# Patient Record
Sex: Male | Born: 1940 | Race: White | Hispanic: No | Marital: Married | Smoking: Former smoker
Health system: Southern US, Community
[De-identification: ages and names within clinical notes are randomized; demographics above are authoritative.]

## PROBLEM LIST (undated history)

## (undated) DIAGNOSIS — C4490 Unspecified malignant neoplasm of skin, unspecified: Secondary | ICD-10-CM

## (undated) DIAGNOSIS — H269 Unspecified cataract: Secondary | ICD-10-CM

## (undated) DIAGNOSIS — M159 Polyosteoarthritis, unspecified: Secondary | ICD-10-CM

## (undated) DIAGNOSIS — I2510 Atherosclerotic heart disease of native coronary artery without angina pectoris: Secondary | ICD-10-CM

## (undated) DIAGNOSIS — G4730 Sleep apnea, unspecified: Secondary | ICD-10-CM

## (undated) DIAGNOSIS — F0390 Unspecified dementia without behavioral disturbance: Secondary | ICD-10-CM

## (undated) DIAGNOSIS — I639 Cerebral infarction, unspecified: Secondary | ICD-10-CM

## (undated) DIAGNOSIS — K219 Gastro-esophageal reflux disease without esophagitis: Secondary | ICD-10-CM

## (undated) DIAGNOSIS — I779 Disorder of arteries and arterioles, unspecified: Secondary | ICD-10-CM

## (undated) DIAGNOSIS — E785 Hyperlipidemia, unspecified: Secondary | ICD-10-CM

## (undated) DIAGNOSIS — L570 Actinic keratosis: Secondary | ICD-10-CM

## (undated) DIAGNOSIS — I350 Nonrheumatic aortic (valve) stenosis: Secondary | ICD-10-CM

## (undated) DIAGNOSIS — C801 Malignant (primary) neoplasm, unspecified: Secondary | ICD-10-CM

## (undated) DIAGNOSIS — R410 Disorientation, unspecified: Secondary | ICD-10-CM

## (undated) DIAGNOSIS — N200 Calculus of kidney: Secondary | ICD-10-CM

## (undated) DIAGNOSIS — I82409 Acute embolism and thrombosis of unspecified deep veins of unspecified lower extremity: Secondary | ICD-10-CM

## (undated) DIAGNOSIS — I10 Essential (primary) hypertension: Secondary | ICD-10-CM

## (undated) DIAGNOSIS — I5032 Chronic diastolic (congestive) heart failure: Secondary | ICD-10-CM

## (undated) DIAGNOSIS — I2699 Other pulmonary embolism without acute cor pulmonale: Secondary | ICD-10-CM

## (undated) HISTORY — DX: Cerebral infarction, unspecified: I63.9

## (undated) HISTORY — DX: Essential (primary) hypertension: I10

## (undated) HISTORY — DX: Malignant (primary) neoplasm, unspecified: C80.1

## (undated) HISTORY — DX: Sleep apnea, unspecified: G47.30

## (undated) HISTORY — DX: Actinic keratosis: L57.0

## (undated) HISTORY — DX: Unspecified cataract: H26.9

## (undated) HISTORY — PX: OTHER SURGICAL HISTORY: SHX169

## (undated) HISTORY — PX: CARDIAC CATHETERIZATION: SHX172

## (undated) HISTORY — DX: Gastro-esophageal reflux disease without esophagitis: K21.9

---
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
## 2001-02-09 HISTORY — PX: OTHER SURGICAL HISTORY: SHX169

## 2003-09-11 DIAGNOSIS — E8881 Metabolic syndrome: Secondary | ICD-10-CM | POA: Insufficient documentation

## 2003-09-11 DIAGNOSIS — E785 Hyperlipidemia, unspecified: Secondary | ICD-10-CM | POA: Insufficient documentation

## 2004-01-16 DIAGNOSIS — J3800 Paralysis of vocal cords and larynx, unspecified: Secondary | ICD-10-CM | POA: Insufficient documentation

## 2007-03-08 HISTORY — PX: OTHER SURGICAL HISTORY: SHX169

## 2007-07-11 DIAGNOSIS — I2510 Atherosclerotic heart disease of native coronary artery without angina pectoris: Secondary | ICD-10-CM | POA: Insufficient documentation

## 2007-07-11 HISTORY — PX: OTHER SURGICAL HISTORY: SHX169

## 2007-07-18 HISTORY — PX: RIGHT AND LEFT HEART CATH: CATH118262

## 2009-06-03 DIAGNOSIS — M75100 Unspecified rotator cuff tear or rupture of unspecified shoulder, not specified as traumatic: Secondary | ICD-10-CM | POA: Insufficient documentation

## 2009-06-03 HISTORY — PX: OTHER SURGICAL HISTORY: SHX169

## 2010-08-10 DIAGNOSIS — I82409 Acute embolism and thrombosis of unspecified deep veins of unspecified lower extremity: Secondary | ICD-10-CM

## 2010-08-10 DIAGNOSIS — I2699 Other pulmonary embolism without acute cor pulmonale: Secondary | ICD-10-CM

## 2010-08-10 HISTORY — DX: Acute embolism and thrombosis of unspecified deep veins of unspecified lower extremity: I82.409

## 2010-08-10 HISTORY — DX: Other pulmonary embolism without acute cor pulmonale: I26.99

## 2015-10-15 DIAGNOSIS — I350 Nonrheumatic aortic (valve) stenosis: Secondary | ICD-10-CM | POA: Insufficient documentation

## 2015-10-15 DIAGNOSIS — E785 Hyperlipidemia, unspecified: Secondary | ICD-10-CM | POA: Insufficient documentation

## 2015-10-15 DIAGNOSIS — I6523 Occlusion and stenosis of bilateral carotid arteries: Secondary | ICD-10-CM | POA: Insufficient documentation

## 2015-10-15 DIAGNOSIS — M159 Polyosteoarthritis, unspecified: Secondary | ICD-10-CM | POA: Insufficient documentation

## 2015-10-15 DIAGNOSIS — E291 Testicular hypofunction: Secondary | ICD-10-CM | POA: Insufficient documentation

## 2015-10-15 DIAGNOSIS — N200 Calculus of kidney: Secondary | ICD-10-CM | POA: Insufficient documentation

## 2015-10-15 DIAGNOSIS — G4730 Sleep apnea, unspecified: Secondary | ICD-10-CM | POA: Insufficient documentation

## 2015-10-15 DIAGNOSIS — I10 Essential (primary) hypertension: Secondary | ICD-10-CM | POA: Insufficient documentation

## 2015-10-15 HISTORY — DX: Hyperlipidemia, unspecified: E78.5

## 2016-10-07 DIAGNOSIS — K5900 Constipation, unspecified: Secondary | ICD-10-CM | POA: Insufficient documentation

## 2018-02-16 HISTORY — PX: CHOLECYSTECTOMY: SHX55

## 2018-05-24 DIAGNOSIS — S83207A Unspecified tear of unspecified meniscus, current injury, left knee, initial encounter: Secondary | ICD-10-CM | POA: Insufficient documentation

## 2018-08-03 ENCOUNTER — Telehealth: Payer: Self-pay

## 2018-08-03 NOTE — Telephone Encounter (Signed)
Patients' wife Inez Catalina called to schedule a new patient appointment for patient. She states Dr. Rosanna Randy said it was ok to schedule. Please advise.

## 2018-08-03 NOTE — Telephone Encounter (Signed)
Ok to schedule.

## 2018-08-03 NOTE — Telephone Encounter (Signed)
Yes  thx

## 2018-08-04 NOTE — Telephone Encounter (Signed)
Left message to call back  

## 2018-10-24 ENCOUNTER — Ambulatory Visit (INDEPENDENT_AMBULATORY_CARE_PROVIDER_SITE_OTHER): Payer: Medicare Other | Admitting: Family Medicine

## 2018-10-24 ENCOUNTER — Encounter: Payer: Self-pay | Admitting: Family Medicine

## 2018-10-24 ENCOUNTER — Other Ambulatory Visit: Payer: Self-pay

## 2018-10-24 VITALS — BP 126/72 | HR 55 | Temp 97.6°F | Resp 18 | Ht 66.0 in | Wt 232.2 lb

## 2018-10-24 DIAGNOSIS — Z23 Encounter for immunization: Secondary | ICD-10-CM

## 2018-10-24 DIAGNOSIS — K219 Gastro-esophageal reflux disease without esophagitis: Secondary | ICD-10-CM

## 2018-10-24 DIAGNOSIS — R1010 Upper abdominal pain, unspecified: Secondary | ICD-10-CM | POA: Diagnosis not present

## 2018-10-24 DIAGNOSIS — E785 Hyperlipidemia, unspecified: Secondary | ICD-10-CM

## 2018-10-24 DIAGNOSIS — I25119 Atherosclerotic heart disease of native coronary artery with unspecified angina pectoris: Secondary | ICD-10-CM

## 2018-10-24 DIAGNOSIS — I10 Essential (primary) hypertension: Secondary | ICD-10-CM

## 2018-10-24 NOTE — Progress Notes (Addendum)
Patient: Jacob Montes Male    DOB: Jun 11, 1940   78 y.o.   MRN: [id number] Visit Date: 10/24/2018  Today's Provider: [name], MD   Chief Complaint  Patient presents with  . New Patient (Initial Visit)   Subjective:     HPI  Patient is the married father of 2 sons.  His son Shanon Brow is here in [place] and has 2 daughters.  There is only grandchildren.  He has been married for 12 years.  He is retired from [employer] and recently moved here from [place] where he is lived since 1974. Patient has been experiencing some abdominal pain and nausea since having gallbladder surgery in January.  He has had no abdominal pain for 2 to 3 weeks.  Occasional diarrhea/loose stool.  medication refill on Metoprolol 25 mg .  He has no chest pain, no weight loss.  No dyspnea on exertion.  Overall he feels well.  Past surgical history significant for CABG x3 in 2009, TAVR in 2018, left carotid endarterectomy 2004.., Cholecystectomy January 2020. Not on File   Current Outpatient Medications:  .  amLODipine (NORVASC) 5 MG tablet, Take 5 mg by mouth daily., Disp: , Rfl:  .  amoxicillin (AMOXIL) 250 MG capsule, Take 250 mg by mouth 3 (three) times daily. Take before dental procedures, Disp: , Rfl:  .  aspirin 81 MG chewable tablet, Chew 81 mg by mouth daily., Disp: , Rfl:  .  docusate sodium (COLACE) 250 MG capsule, Take 250 mg by mouth daily., Disp: , Rfl:  .  metoprolol tartrate (LOPRESSOR) 25 MG tablet, Take 25 mg by mouth daily., Disp: , Rfl:  .  montelukast (SINGULAIR) 10 MG tablet, Take 10 mg by mouth at bedtime., Disp: , Rfl:  .  Multiple Vitamin (MULTIVITAMIN) tablet, Take 1 tablet by mouth daily., Disp: , Rfl:  .  omeprazole (PRILOSEC) 20 MG capsule, Take 20 mg by mouth daily., Disp: , Rfl:  .  rosuvastatin (CRESTOR) 20 MG tablet, Take 20 mg by mouth daily., Disp: , Rfl:   Review of Systems  Constitutional: Negative.   HENT: Positive for hearing loss and  tinnitus.   Respiratory: Positive for apnea.   Gastrointestinal: Positive for abdominal pain.  Endocrine: Negative.   Genitourinary: Positive for frequency.  Musculoskeletal: Positive for back pain.  Allergic/Immunologic: Negative.   Neurological: Negative.   Hematological: Negative.   Psychiatric/Behavioral: Negative.     Social History   Tobacco Use  . Smoking status: Former Research scientist (life sciences)  . Smokeless tobacco: Never Used  Substance Use Topics  . Alcohol use: Never    Frequency: Never      Objective:   BP 126/72 (BP Location: Right Arm, Patient Position: Sitting, Cuff Size: Large)   Pulse (!) 55   Temp 97.6 F (36.4 C) (Temporal)   Resp 18   Ht 5\' 6"  (1.676 m)   Wt 232 lb 3.2 oz (105.3 kg)   SpO2 96%   BMI 37.48 kg/m  Vitals:   10/24/18 1054  BP: 126/72  Pulse: (!) 55  Resp: 18  Temp: 97.6 F (36.4 C)  TempSrc: Temporal  SpO2: 96%  Weight: 232 lb 3.2 oz (105.3 kg)  Height: 5\' 6"  (1.676 m)  Body mass index is 37.48 kg/m.   Physical Exam Vitals signs reviewed.  Constitutional:      Appearance: He is obese.  HENT:     Head: Normocephalic and atraumatic.     Right Ear: External  ear normal.     Left Ear: External ear normal.  Eyes:     General: No scleral icterus.    Conjunctiva/sclera: Conjunctivae normal.  Cardiovascular:     Rate and Rhythm: Normal rate and regular rhythm.     Heart sounds: Normal heart sounds.  Pulmonary:     Breath sounds: Normal breath sounds.  Abdominal:     Palpations: Abdomen is soft.     Tenderness: There is no abdominal tenderness.  Skin:    General: Skin is warm and dry.  Neurological:     General: No focal deficit present.     Mental Status: He is alert and oriented to person, place, and time.  Psychiatric:        Mood and Affect: Mood normal.        Behavior: Behavior normal.        Thought Content: Thought content normal.        Judgment: Judgment normal.      No results found for any visits on 10/24/18.      Assessment & Plan    1. Pain of upper abdomen Try cholestyramine daily.  2. Need for immunization against influenza  - Flu vaccine HIGH DOSE PF  3. Gastroesophageal reflux disease without esophagitis Continue omeprazole. 4. Hyperlipidemia, unspecified hyperlipidemia type Obtain baseline lab work. - Lipid Profile  5. Essential hypertension  - Comp. Metabolic Panel (12) - CBC w/Diff/Platelet - TSH  6. Coronary artery disease with angina pectoris, unspecified vessel or lesion type, unspecified whether native or transplanted heart ([place]) All risk factors treated.More than 50% 30 minute visit spent in counseling or coordination of care  - Comp. Metabolic Panel (12) - CBC w/Diff/Platelet - TSH     Sharanda Shinault Cranford Mon, MD  [facility]

## 2018-10-25 LAB — COMP. METABOLIC PANEL (12)
AST: 33 IU/L (ref 0–40)
Albumin/Globulin Ratio: 1.9 (ref 1.2–2.2)
Albumin: 4.2 g/dL (ref 3.7–4.7)
Alkaline Phosphatase: 164 IU/L — ABNORMAL HIGH (ref 39–117)
BUN/Creatinine Ratio: 21 (ref 10–24)
BUN: 15 mg/dL (ref 8–27)
Bilirubin Total: 1 mg/dL (ref 0.0–1.2)
Calcium: 9.6 mg/dL (ref 8.6–10.2)
Chloride: 103 mmol/L (ref 96–106)
Creatinine, Ser: 0.73 mg/dL — ABNORMAL LOW (ref 0.76–1.27)
GFR calc Af Amer: 103 mL/min/{1.73_m2} (ref >59)
GFR calc non Af Amer: 89 mL/min/{1.73_m2} (ref >59)
Globulin, Total: 2.2 g/dL (ref 1.5–4.5)
Glucose: 88 mg/dL (ref 65–99)
Potassium: 4.5 mmol/L (ref 3.5–5.2)
Sodium: 140 mmol/L (ref 134–144)
Total Protein: 6.4 g/dL (ref 6.0–8.5)

## 2018-10-25 LAB — LIPID PANEL
Chol/HDL Ratio: 3 ratio (ref 0.0–5.0)
Cholesterol, Total: 133 mg/dL (ref 100–199)
HDL: 45 mg/dL (ref >39)
LDL Chol Calc (NIH): 69 mg/dL (ref 0–99)
Triglycerides: 102 mg/dL (ref 0–149)
VLDL Cholesterol Cal: 19 mg/dL (ref 5–40)

## 2018-10-25 LAB — CBC WITH DIFFERENTIAL/PLATELET
Basophils Absolute: 0 10*3/uL (ref 0.0–0.2)
Basos: 1 %
EOS (ABSOLUTE): 0.2 10*3/uL (ref 0.0–0.4)
Eos: 3 %
Hematocrit: 42.8 % (ref 37.5–51.0)
Hemoglobin: 14.9 g/dL (ref 13.0–17.7)
Immature Grans (Abs): 0 10*3/uL (ref 0.0–0.1)
Immature Granulocytes: 0 %
Lymphocytes Absolute: 2.4 10*3/uL (ref 0.7–3.1)
Lymphs: 40 %
MCH: 33.1 pg — ABNORMAL HIGH (ref 26.6–33.0)
MCHC: 34.8 g/dL (ref 31.5–35.7)
MCV: 95 fL (ref 79–97)
Monocytes Absolute: 0.6 10*3/uL (ref 0.1–0.9)
Monocytes: 10 %
Neutrophils Absolute: 2.9 10*3/uL (ref 1.4–7.0)
Neutrophils: 46 %
Platelets: 141 10*3/uL — ABNORMAL LOW (ref 150–450)
RBC: 4.5 x10E6/uL (ref 4.14–5.80)
RDW: 13.4 % (ref 11.6–15.4)
WBC: 6.1 10*3/uL (ref 3.4–10.8)

## 2018-10-25 LAB — TSH: TSH: 0.525 u[IU]/mL (ref 0.450–4.500)

## 2018-11-16 DIAGNOSIS — N200 Calculus of kidney: Secondary | ICD-10-CM

## 2018-11-16 DIAGNOSIS — I350 Nonrheumatic aortic (valve) stenosis: Secondary | ICD-10-CM | POA: Insufficient documentation

## 2018-11-16 DIAGNOSIS — K5900 Constipation, unspecified: Secondary | ICD-10-CM | POA: Insufficient documentation

## 2018-11-16 DIAGNOSIS — J3800 Paralysis of vocal cords and larynx, unspecified: Secondary | ICD-10-CM | POA: Insufficient documentation

## 2018-11-16 DIAGNOSIS — E291 Testicular hypofunction: Secondary | ICD-10-CM | POA: Insufficient documentation

## 2018-11-16 DIAGNOSIS — I2510 Atherosclerotic heart disease of native coronary artery without angina pectoris: Secondary | ICD-10-CM | POA: Insufficient documentation

## 2018-11-16 DIAGNOSIS — I10 Essential (primary) hypertension: Secondary | ICD-10-CM | POA: Insufficient documentation

## 2018-11-16 DIAGNOSIS — I6523 Occlusion and stenosis of bilateral carotid arteries: Secondary | ICD-10-CM | POA: Insufficient documentation

## 2018-11-16 DIAGNOSIS — E785 Hyperlipidemia, unspecified: Secondary | ICD-10-CM | POA: Insufficient documentation

## 2018-11-16 DIAGNOSIS — N400 Enlarged prostate without lower urinary tract symptoms: Secondary | ICD-10-CM | POA: Insufficient documentation

## 2018-11-16 DIAGNOSIS — G4730 Sleep apnea, unspecified: Secondary | ICD-10-CM | POA: Insufficient documentation

## 2018-11-16 DIAGNOSIS — E8881 Metabolic syndrome: Secondary | ICD-10-CM | POA: Insufficient documentation

## 2018-11-16 DIAGNOSIS — R739 Hyperglycemia, unspecified: Secondary | ICD-10-CM | POA: Insufficient documentation

## 2018-11-16 DIAGNOSIS — M159 Polyosteoarthritis, unspecified: Secondary | ICD-10-CM

## 2018-11-16 HISTORY — DX: Calculus of kidney: N20.0

## 2018-11-16 HISTORY — DX: Polyosteoarthritis, unspecified: M15.9

## 2018-11-17 ENCOUNTER — Other Ambulatory Visit: Payer: Self-pay

## 2018-11-17 ENCOUNTER — Emergency Department: Payer: Medicare Other

## 2018-11-17 ENCOUNTER — Inpatient Hospital Stay
Admission: EM | Admit: 2018-11-17 | Discharge: 2018-11-21 | DRG: 871 | Disposition: A | Discharge status: Home Health | Payer: Medicare Other | Attending: Internal Medicine | Admitting: Internal Medicine

## 2018-11-17 ENCOUNTER — Inpatient Hospital Stay (HOSPITAL_COMMUNITY)
Admission: EM | Admit: 2018-11-17 | Discharge: 2018-11-17 | Disposition: A | Discharge status: Home / Self Care | Payer: Medicare Other | Source: Home / Self Care | Attending: Physician Assistant | Admitting: Physician Assistant

## 2018-11-17 DIAGNOSIS — Z85828 Personal history of other malignant neoplasm of skin: Secondary | ICD-10-CM

## 2018-11-17 DIAGNOSIS — E785 Hyperlipidemia, unspecified: Secondary | ICD-10-CM | POA: Diagnosis present

## 2018-11-17 DIAGNOSIS — Z87891 Personal history of nicotine dependence: Secondary | ICD-10-CM

## 2018-11-17 DIAGNOSIS — A4181 Sepsis due to Enterococcus: Principal | ICD-10-CM | POA: Diagnosis present

## 2018-11-17 DIAGNOSIS — G4730 Sleep apnea, unspecified: Secondary | ICD-10-CM | POA: Diagnosis present

## 2018-11-17 DIAGNOSIS — Z8249 Family history of ischemic heart disease and other diseases of the circulatory system: Secondary | ICD-10-CM

## 2018-11-17 DIAGNOSIS — G4733 Obstructive sleep apnea (adult) (pediatric): Secondary | ICD-10-CM | POA: Diagnosis present

## 2018-11-17 DIAGNOSIS — R7881 Bacteremia: Secondary | ICD-10-CM

## 2018-11-17 DIAGNOSIS — I340 Nonrheumatic mitral (valve) insufficiency: Secondary | ICD-10-CM | POA: Diagnosis not present

## 2018-11-17 DIAGNOSIS — Z801 Family history of malignant neoplasm of trachea, bronchus and lung: Secondary | ICD-10-CM

## 2018-11-17 DIAGNOSIS — Z951 Presence of aortocoronary bypass graft: Secondary | ICD-10-CM

## 2018-11-17 DIAGNOSIS — I25118 Atherosclerotic heart disease of native coronary artery with other forms of angina pectoris: Secondary | ICD-10-CM

## 2018-11-17 DIAGNOSIS — K219 Gastro-esophageal reflux disease without esophagitis: Secondary | ICD-10-CM | POA: Diagnosis present

## 2018-11-17 DIAGNOSIS — K8030 Calculus of bile duct with cholangitis, unspecified, without obstruction: Secondary | ICD-10-CM | POA: Diagnosis present

## 2018-11-17 DIAGNOSIS — K8050 Calculus of bile duct without cholangitis or cholecystitis without obstruction: Secondary | ICD-10-CM

## 2018-11-17 DIAGNOSIS — I350 Nonrheumatic aortic (valve) stenosis: Secondary | ICD-10-CM

## 2018-11-17 DIAGNOSIS — D696 Thrombocytopenia, unspecified: Secondary | ICD-10-CM | POA: Diagnosis present

## 2018-11-17 DIAGNOSIS — Z807 Family history of other malignant neoplasms of lymphoid, hematopoietic and related tissues: Secondary | ICD-10-CM

## 2018-11-17 DIAGNOSIS — R52 Pain, unspecified: Secondary | ICD-10-CM

## 2018-11-17 DIAGNOSIS — I2510 Atherosclerotic heart disease of native coronary artery without angina pectoris: Secondary | ICD-10-CM

## 2018-11-17 DIAGNOSIS — R945 Abnormal results of liver function studies: Secondary | ICD-10-CM | POA: Diagnosis not present

## 2018-11-17 DIAGNOSIS — R1011 Right upper quadrant pain: Secondary | ICD-10-CM

## 2018-11-17 DIAGNOSIS — I248 Other forms of acute ischemic heart disease: Secondary | ICD-10-CM | POA: Diagnosis present

## 2018-11-17 DIAGNOSIS — K851 Biliary acute pancreatitis without necrosis or infection: Secondary | ICD-10-CM | POA: Diagnosis present

## 2018-11-17 DIAGNOSIS — K8309 Other cholangitis: Secondary | ICD-10-CM | POA: Diagnosis not present

## 2018-11-17 DIAGNOSIS — R011 Cardiac murmur, unspecified: Secondary | ICD-10-CM | POA: Diagnosis not present

## 2018-11-17 DIAGNOSIS — K858 Other acute pancreatitis without necrosis or infection: Secondary | ICD-10-CM

## 2018-11-17 DIAGNOSIS — Z86718 Personal history of other venous thrombosis and embolism: Secondary | ICD-10-CM | POA: Diagnosis not present

## 2018-11-17 DIAGNOSIS — I10 Essential (primary) hypertension: Secondary | ICD-10-CM | POA: Diagnosis present

## 2018-11-17 DIAGNOSIS — Z79899 Other long term (current) drug therapy: Secondary | ICD-10-CM | POA: Diagnosis not present

## 2018-11-17 DIAGNOSIS — Z95828 Presence of other vascular implants and grafts: Secondary | ICD-10-CM | POA: Diagnosis not present

## 2018-11-17 DIAGNOSIS — Z823 Family history of stroke: Secondary | ICD-10-CM | POA: Diagnosis not present

## 2018-11-17 DIAGNOSIS — Z8673 Personal history of transient ischemic attack (TIA), and cerebral infarction without residual deficits: Secondary | ICD-10-CM | POA: Diagnosis not present

## 2018-11-17 DIAGNOSIS — Z952 Presence of prosthetic heart valve: Secondary | ICD-10-CM

## 2018-11-17 DIAGNOSIS — M159 Polyosteoarthritis, unspecified: Secondary | ICD-10-CM | POA: Diagnosis present

## 2018-11-17 DIAGNOSIS — Z20828 Contact with and (suspected) exposure to other viral communicable diseases: Secondary | ICD-10-CM | POA: Diagnosis present

## 2018-11-17 DIAGNOSIS — B952 Enterococcus as the cause of diseases classified elsewhere: Secondary | ICD-10-CM | POA: Diagnosis not present

## 2018-11-17 DIAGNOSIS — Z7982 Long term (current) use of aspirin: Secondary | ICD-10-CM | POA: Diagnosis not present

## 2018-11-17 DIAGNOSIS — Z9049 Acquired absence of other specified parts of digestive tract: Secondary | ICD-10-CM | POA: Diagnosis not present

## 2018-11-17 DIAGNOSIS — Z86711 Personal history of pulmonary embolism: Secondary | ICD-10-CM | POA: Diagnosis not present

## 2018-11-17 HISTORY — DX: Unspecified malignant neoplasm of skin, unspecified: C44.90

## 2018-11-17 HISTORY — DX: Atherosclerotic heart disease of native coronary artery without angina pectoris: I25.10

## 2018-11-17 HISTORY — DX: Disorder of arteries and arterioles, unspecified: I77.9

## 2018-11-17 HISTORY — DX: Hyperlipidemia, unspecified: E78.5

## 2018-11-17 HISTORY — DX: Nonrheumatic aortic (valve) stenosis: I35.0

## 2018-11-17 LAB — BLOOD CULTURE ID PANEL (REFLEXED)

## 2018-11-17 LAB — ECHOCARDIOGRAM COMPLETE
Height: 66 in
Weight: 3616 oz

## 2018-11-17 LAB — LIPASE, BLOOD: Lipase: 603 U/L — ABNORMAL HIGH (ref 11–51)

## 2018-11-17 LAB — CBC WITH DIFFERENTIAL/PLATELET
Abs Immature Granulocytes: 0.05 10*3/uL (ref 0.00–0.07)
Basophils Absolute: 0 10*3/uL (ref 0.0–0.1)
Basophils Relative: 0 %
Eosinophils Absolute: 0 10*3/uL (ref 0.0–0.5)
Eosinophils Relative: 0 %
HCT: 42.9 % (ref 39.0–52.0)
Hemoglobin: 15.1 g/dL (ref 13.0–17.0)
Immature Granulocytes: 0 %
Lymphocytes Relative: 6 %
Lymphs Abs: 0.9 10*3/uL (ref 0.7–4.0)
MCH: 32.9 pg (ref 26.0–34.0)
MCHC: 35.2 g/dL (ref 30.0–36.0)
MCV: 93.5 fL (ref 80.0–100.0)
Monocytes Absolute: 0.9 10*3/uL (ref 0.1–1.0)
Monocytes Relative: 6 %
Neutro Abs: 12.4 10*3/uL — ABNORMAL HIGH (ref 1.7–7.7)
Neutrophils Relative %: 88 %
Platelets: 100 10*3/uL — ABNORMAL LOW (ref 150–400)
RBC: 4.59 MIL/uL (ref 4.22–5.81)
RDW: 13.9 % (ref 11.5–15.5)
WBC: 14.2 10*3/uL — ABNORMAL HIGH (ref 4.0–10.5)
nRBC: 0 % (ref 0.0–0.2)

## 2018-11-17 LAB — LACTIC ACID, PLASMA
Lactic Acid, Venous: 2.4 mmol/L (ref 0.5–1.9)
Lactic Acid, Venous: 2.1 mmol/L (ref 0.5–1.9)

## 2018-11-17 LAB — PROTIME-INR
INR: 1.1 (ref 0.8–1.2)
Prothrombin Time: 14.4 seconds (ref 11.4–15.2)

## 2018-11-17 LAB — COMPREHENSIVE METABOLIC PANEL
ALT: 484 U/L — ABNORMAL HIGH (ref 0–44)
AST: 441 U/L — ABNORMAL HIGH (ref 15–41)
Albumin: 4.1 g/dL (ref 3.5–5.0)
Alkaline Phosphatase: 373 U/L — ABNORMAL HIGH (ref 38–126)
Anion gap: 12 (ref 5–15)
BUN: 22 mg/dL (ref 8–23)
CO2: 22 mmol/L (ref 22–32)
Calcium: 9.2 mg/dL (ref 8.9–10.3)
Chloride: 104 mmol/L (ref 98–111)
Creatinine, Ser: 0.66 mg/dL (ref 0.61–1.24)
GFR calc Af Amer: >60 mL/min (ref >60)
GFR calc non Af Amer: >60 mL/min (ref >60)
Glucose, Bld: 162 mg/dL — ABNORMAL HIGH (ref 70–99)
Potassium: 4 mmol/L (ref 3.5–5.1)
Sodium: 138 mmol/L (ref 135–145)
Total Bilirubin: 8.3 mg/dL — ABNORMAL HIGH (ref 0.3–1.2)
Total Protein: 7.4 g/dL (ref 6.5–8.1)

## 2018-11-17 LAB — APTT: aPTT: 30 seconds (ref 24–36)

## 2018-11-17 LAB — SARS CORONAVIRUS 2 BY RT PCR (HOSPITAL ORDER, PERFORMED IN ~~LOC~~ HOSPITAL LAB): SARS Coronavirus 2: NEGATIVE

## 2018-11-17 LAB — TROPONIN I (HIGH SENSITIVITY)
Troponin I (High Sensitivity): 511 ng/L (ref <18)
Troponin I (High Sensitivity): 705 ng/L (ref <18)
Troponin I (High Sensitivity): 1402 ng/L (ref <18)
Troponin I (High Sensitivity): 1436 ng/L (ref <18)

## 2018-11-17 MED ORDER — MORPHINE SULFATE (PF) 2 MG/ML IV SOLN
2.0000 mg | INTRAVENOUS | Status: DC | PRN
  Administered 2018-11-17 – 2018-11-18 (×7): 2 mg via INTRAVENOUS
  Filled 2018-11-17 (×7): qty 1

## 2018-11-17 MED ORDER — PERFLUTREN LIPID MICROSPHERE
1.0000 mL | INTRAVENOUS | Status: AC | PRN
  Administered 2018-11-17: 2 mL via INTRAVENOUS
  Filled 2018-11-17: qty 10

## 2018-11-17 MED ORDER — PANTOPRAZOLE SODIUM 40 MG PO TBEC
40.0000 mg | DELAYED_RELEASE_TABLET | Freq: Every day | ORAL | Status: DC
  Administered 2018-11-17 – 2018-11-21 (×5): 40 mg via ORAL
  Filled 2018-11-17 (×5): qty 1

## 2018-11-17 MED ORDER — ADULT MULTIVITAMIN W/MINERALS CH
1.0000 | ORAL_TABLET | Freq: Every day | ORAL | Status: DC
  Administered 2018-11-19 – 2018-11-21 (×3): 1 via ORAL
  Filled 2018-11-17 (×3): qty 1

## 2018-11-17 MED ORDER — IOHEXOL 300 MG/ML  SOLN
100.0000 mL | Freq: Once | INTRAMUSCULAR | Status: AC | PRN
  Administered 2018-11-17: 100 mL via INTRAVENOUS

## 2018-11-17 MED ORDER — ONDANSETRON HCL 4 MG/2ML IJ SOLN
4.0000 mg | Freq: Once | INTRAMUSCULAR | Status: AC
  Administered 2018-11-17: 4 mg via INTRAVENOUS
  Filled 2018-11-17: qty 2

## 2018-11-17 MED ORDER — ONDANSETRON HCL 4 MG PO TABS: 4.0000 mg | ORAL_TABLET | Freq: Four times a day (QID) | ORAL | Status: DC | PRN

## 2018-11-17 MED ORDER — SODIUM CHLORIDE 0.9 % IV SOLN
INTRAVENOUS | Status: DC
  Administered 2018-11-17: 1000 mL via INTRAVENOUS
  Administered 2018-11-18 (×2): via INTRAVENOUS

## 2018-11-17 MED ORDER — ACETAMINOPHEN 325 MG PO TABS
650.0000 mg | ORAL_TABLET | Freq: Four times a day (QID) | ORAL | Status: DC | PRN
  Administered 2018-11-19: 650 mg via ORAL
  Filled 2018-11-17: qty 2

## 2018-11-17 MED ORDER — SODIUM CHLORIDE 0.9 % IV SOLN
3.0000 g | Freq: Four times a day (QID) | INTRAVENOUS | Status: DC
  Administered 2018-11-17 – 2018-11-21 (×15): 3 g via INTRAVENOUS
  Filled 2018-11-17: qty 8
  Filled 2018-11-17: qty 3
  Filled 2018-11-17 (×3): qty 8
  Filled 2018-11-17: qty 3
  Filled 2018-11-17 (×3): qty 8
  Filled 2018-11-17 (×2): qty 3
  Filled 2018-11-17: qty 8
  Filled 2018-11-17: qty 3
  Filled 2018-11-17 (×7): qty 8

## 2018-11-17 MED ORDER — METRONIDAZOLE IN NACL 5-0.79 MG/ML-% IV SOLN
500.0000 mg | Freq: Once | INTRAVENOUS | Status: AC
  Administered 2018-11-17: 500 mg via INTRAVENOUS
  Filled 2018-11-17: qty 100

## 2018-11-17 MED ORDER — ACETAMINOPHEN 650 MG RE SUPP: 650.0000 mg | Freq: Four times a day (QID) | RECTAL | Status: DC | PRN

## 2018-11-17 MED ORDER — ENOXAPARIN SODIUM 40 MG/0.4ML ~~LOC~~ SOLN
40.0000 mg | SUBCUTANEOUS | Status: DC
  Administered 2018-11-17: 40 mg via SUBCUTANEOUS
  Filled 2018-11-17: qty 0.4

## 2018-11-17 MED ORDER — AMLODIPINE BESYLATE 5 MG PO TABS
5.0000 mg | ORAL_TABLET | Freq: Every day | ORAL | Status: DC
  Administered 2018-11-17 – 2018-11-21 (×5): 5 mg via ORAL
  Filled 2018-11-17 (×5): qty 1

## 2018-11-17 MED ORDER — METOPROLOL TARTRATE 25 MG PO TABS
25.0000 mg | ORAL_TABLET | Freq: Two times a day (BID) | ORAL | Status: DC
  Administered 2018-11-17 – 2018-11-21 (×9): 25 mg via ORAL
  Filled 2018-11-17 (×9): qty 1

## 2018-11-17 MED ORDER — ROSUVASTATIN CALCIUM 10 MG PO TABS
20.0000 mg | ORAL_TABLET | Freq: Every evening | ORAL | Status: DC
  Administered 2018-11-17 – 2018-11-20 (×3): 20 mg via ORAL
  Filled 2018-11-17: qty 1
  Filled 2018-11-17 (×3): qty 2

## 2018-11-17 MED ORDER — ONDANSETRON HCL 4 MG/2ML IJ SOLN
4.0000 mg | Freq: Four times a day (QID) | INTRAMUSCULAR | Status: DC | PRN
  Administered 2018-11-18 (×2): 4 mg via INTRAVENOUS
  Filled 2018-11-17: qty 2

## 2018-11-17 MED ORDER — MORPHINE SULFATE (PF) 4 MG/ML IV SOLN
4.0000 mg | Freq: Once | INTRAVENOUS | Status: AC
  Administered 2018-11-17: 4 mg via INTRAVENOUS
  Filled 2018-11-17: qty 1

## 2018-11-17 MED ORDER — SODIUM CHLORIDE 0.9 % IV SOLN
2.0000 g | Freq: Once | INTRAVENOUS | Status: AC
  Administered 2018-11-17: 2 g via INTRAVENOUS
  Filled 2018-11-17: qty 20

## 2018-11-17 MED ORDER — PIPERACILLIN-TAZOBACTAM 3.375 G IVPB
3.3750 g | Freq: Three times a day (TID) | INTRAVENOUS | Status: DC
  Administered 2018-11-17: 3.375 g via INTRAVENOUS
  Filled 2018-11-17: qty 50

## 2018-11-17 NOTE — ED Notes (Addendum)
Pt eating ice chips, and has had a few sips of water with meds. Wife at bedside. Pt has urinal at bedside, denies further needs at this time. Apologized for delay of assigned hospital bed and will continue to monitor patient.

## 2018-11-17 NOTE — ED Notes (Addendum)
Spoke with lab again, said there was a mix up as to which tube the troponin was added-on to; said it has been fixed. Will let MD know. At this point have called to correct this problem 4 times

## 2018-11-17 NOTE — ED Notes (Signed)
Ginger ale given, wife at bedside. Pt assisted to sitting position. No distress noted.

## 2018-11-17 NOTE — H&P (Addendum)
[facility] [place] NAME: Jacob Montes    MR#:  [id number]  DATE OF BIRTH:  [birth year]  DATE OF ADMISSION:  11/17/2018  PRIMARY CARE PHYSICIAN: Jerrol Banana., MD   REQUESTING/REFERRING PHYSICIAN: Dr. Marjean Donna  CHIEF COMPLAINT:   Chief Complaint  Patient presents with   Abdominal Pain    HISTORY OF PRESENT ILLNESS:  Jacob Montes  is a 78 y.o. 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 Patient and his wife have recently moved from [place] to [place] about 5 months ago.  He had acute cholecystitis and cholecystectomy done in January 2020 in [place].  Since that procedure he has had 3 episodes of abdominal pain starting in June 2020 and 2 more of them in September 2020.  Seen by his PCP last month and was started on cholestyramine.  Of these episodes were relieved after he vomiting.  Episodes are triggered by eating a meal.  Last night the symptoms were not improved so he presented to the emergency room.  Labs showing elevated LFTs, lipase and WBC.  Ultrasound of the abdomen and CT abdomen reviewed showing possible CBD stone and dilatation. Patient has not had any chest pain, fevers, dyspnea, cough or palpitations.  He had a echocardiogram and stress test done in January 2020 which were negative according to family.  No other acute complaints at this time.  PAST MEDICAL HISTORY:   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 s/p 3-v CABG in 07/2007   Carotid artery disease ([place])    a. 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   GERD (gastroesophageal reflux disease)    HLD (hyperlipidemia)    Hypertension    Nephrolithiasis 11/16/2018   Pulmonary embolism ([place]) 08/2010   Skin cancer    Sleep apnea    Stroke [facility])     PAST SURGICAL HISTORY:   Past Surgical History:  Procedure Laterality Date   Carotid surgery Left 03/08/2007   CHOLECYSTECTOMY  02/16/2018   Heart Bypass N/A 07/2007   left shoulder surgery Left 06/03/2009   RIGHT AND LEFT HEART CATH  07/18/2007   right shoulder surgery  2003   TVAR      SOCIAL HISTORY:   Social History   Tobacco Use   Smoking status: Former Smoker   Smokeless tobacco: Never Used  Substance Use Topics   Alcohol use: Never    Frequency: Never    FAMILY HISTORY:   Family History  Problem Relation Age of Onset   Hypertension Mother    Stroke Mother    Lung cancer Father    Non-Hodgkin's lymphoma Brother     DRUG ALLERGIES:  No Known Allergies  REVIEW OF SYSTEMS:   Review of Systems  Constitutional: Negative for chills, fever, malaise/fatigue and weight loss.  HENT: Negative for ear discharge, ear pain, hearing loss, nosebleeds and tinnitus.   Eyes: Negative for blurred vision, double vision and photophobia.  Respiratory: Negative for cough, hemoptysis, shortness of breath and  wheezing.   Cardiovascular: Negative for chest pain, palpitations, orthopnea and leg swelling.  Gastrointestinal: Positive for abdominal pain, nausea and vomiting. Negative for constipation, diarrhea, heartburn and melena.  Genitourinary: Negative for dysuria, frequency, hematuria and urgency.  Musculoskeletal: Negative for back pain, myalgias and neck pain.  Skin: Negative for rash.  Neurological: Negative for dizziness, tingling, tremors, sensory change, speech change, focal weakness and headaches.  Endo/Heme/Allergies: Does not bruise/bleed easily.  Psychiatric/Behavioral: Negative for depression.     MEDICATIONS AT HOME:   Prior to Admission medications   Medication Sig Start Date End Date Taking? Authorizing Provider  amLODipine (NORVASC) 5 MG tablet Take 5 mg by mouth daily.   Yes [provider]  amoxicillin (AMOXIL) 500 MG capsule Take 2,000 mg by mouth as directed. Take one hour before dental procedures   Yes [provider]  aspirin 81 MG chewable tablet Chew 81 mg by mouth daily.   Yes [provider]  docusate sodium (COLACE) 250 MG capsule Take 250 mg by mouth daily as needed for constipation.    Yes [provider]  metoprolol tartrate (LOPRESSOR) 25 MG tablet Take 25 mg by mouth 2 (two) times daily.    Yes [provider]  montelukast (SINGULAIR) 10 MG tablet Take 10 mg by mouth at bedtime as needed (allergies).    Yes [provider]  Multiple Vitamin (MULTIVITAMIN) tablet Take 1 tablet by mouth daily.   Yes [provider]  omeprazole (PRILOSEC) 20 MG capsule Take 20 mg by mouth daily.   Yes [provider]  rosuvastatin (CRESTOR) 20 MG tablet Take 20 mg by mouth daily.   Yes [provider]      VITAL SIGNS:  Blood pressure (!) 176/51, pulse 84, temperature 98.5 F (36.9 C), temperature source Oral, resp. rate 18, height 5\' 6"  (1.676 m), weight 102.5 kg, SpO2 95 %.  PHYSICAL EXAMINATION:  Physical Exam  GENERAL:  78 y.o.-year-old patient lying in the bed with no acute distress.  EYES: Pupils equal, round, reactive to light and accommodation. No scleral icterus. Extraocular muscles intact.  HEENT: Head atraumatic, normocephalic. Oropharynx and nasopharynx clear.  NECK:  Supple, no jugular venous distention. No thyroid enlargement, no tenderness.  LUNGS: Normal breath sounds bilaterally, no wheezing, rales,rhonchi or crepitation. No use of accessory muscles of respiration.  Decreased bibasilar breath sounds CARDIOVASCULAR: S1, S2 normal. No rubs, or gallops.  Loud 3/6 systolic murmur is  present at the base of the heart ABDOMEN: Soft, tender in the Epigastric and right upper quadrant regions, nondistended. Bowel sounds present. No organomegaly or mass.  EXTREMITIES: No pedal edema, cyanosis, or clubbing.  NEUROLOGIC: Cranial nerves II through XII are intact. Muscle strength 5/5 in all extremities. Sensation intact. Gait not checked.  PSYCHIATRIC: The patient is alert and oriented x 3.  SKIN: No obvious rash, lesion, or ulcer.   LABORATORY PANEL:   CBC Recent Labs  Lab 11/17/18 0220  WBC 14.2*  HGB 15.1  HCT 42.9  PLT 100*   ------------------------------------------------------------------------------------------------------------------  Chemistries  Recent Labs  Lab 11/17/18 0220  NA 138  K 4.0  CL 104  CO2 22  GLUCOSE 162*  BUN 22  CREATININE 0.66  CALCIUM 9.2  AST 441*  ALT 484*  ALKPHOS 373*  BILITOT 8.3*   ------------------------------------------------------------------------------------------------------------------  Cardiac Enzymes No results for input(s): TROPONINI in the last 168 hours. ------------------------------------------------------------------------------------------------------------------  RADIOLOGY:  Ct Abdomen Pelvis W Contrast  Result Date: 11/17/2018 CLINICAL DATA:  Acute upper abdominal pain  EXAM: CT ABDOMEN AND PELVIS WITH CONTRAST TECHNIQUE: Multidetector CT imaging of the abdomen and pelvis was performed using the standard protocol following bolus administration of intravenous contrast. CONTRAST:  151mL OMNIPAQUE IOHEXOL 300 MG/ML  SOLN COMPARISON:  None. FINDINGS: Lower chest:  Aortic valve replacement.  No acute finding Hepatobiliary: No focal liver abnormality.Cholecystectomy with intra and extrahepatic bile duct dilatation. An 8 mm stone is seen at the distal common bile duct. Maximal common bile duct diameter is 12 mm. Pancreas: Unremarkable. Spleen: Remote insult with capsular calcification laterally. Adrenals/Urinary  Tract: Negative adrenals. No hydronephrosis or stone. Unremarkable bladder. Stomach/Bowel: No obstruction. No appendicitis. Sigmoid diverticulosis. Vascular/Lymphatic: No acute vascular abnormality. 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 Question if this was related to the prior splenic trauma. Mild for age atherosclerotic plaque. No mass or adenopathy. Reproductive:Negative Other: No ascites or pneumoperitoneum. Fatty enlargement of the bilateral inguinal canal. Musculoskeletal: No acute abnormalities. 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 Dilated biliary tree with 8 mm distal CBD stone. 2. Cholecystectomy. Electronically Signed   By: Monte Fantasia M.D.   On: 11/17/2018 04:45   US Abdomen Limited Ruq  Result Date: 11/17/2018 CLINICAL DATA:  Right upper quadrant pain since yesterday EXAM: ULTRASOUND ABDOMEN LIMITED RIGHT UPPER QUADRANT COMPARISON:  None. FINDINGS: Gallbladder: Surgically absent Common bile duct: Diameter: 1 cm.  The distal common bile duct is not visualized. Liver: Intrahepatic bile duct dilatation. No evidence of mass or collection. Portal vein is patent on color Doppler imaging with normal direction of blood flow towards the liver. 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 The stone seen by CT is not visible on this study due to shadowing bowel gas. Electronically Signed   By: Monte Fantasia M.D.   On: 11/17/2018 05:42      IMPRESSION AND PLAN:   Jacob Montes  is a 78 y.o. 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  1.   Sepsis-secondary to acute cholangitis. -Elevated LFTs secondary to the same.  GI has been consulted.  CT showing 8 mm stone in CBD with CBD dilatation.  Patient will need ERCP -No need for MRCP -We will start on IV antibiotics.  2.  Acute gallstone pancreatitis-patient is status post cholecystectomy but has CBD stone. -We will need ERCP.  Continue to remain n.p.o. with IV fluids, ice chips and sips of water by mouth only. - elevated LFTs  3.  Elevated troponins-possible demand ischemia.  Appreciate cardiology consult -Stat echo at bedside done.  If preserved ejection fraction, no further inpatient cardiac procedures. -Consider outpatient stress testing and follow-up with cardiology.  4.  CAD status post CABG-no chest pain or shortness of breath.  Echo pending to evaluate LV ejection fraction and aortic valvular function as patient is status post TAVR -Continue cardiac medications.  Appreciate cardiology consult.  5.  Hypertension-on Norvasc, metoprolol  6.  DVT prophylaxis-Lovenox after the procedure   Patient is independent at baseline. Wife updated at bedside   All the records are reviewed and case discussed with ED provider. Management plans discussed with the patient, family and they are in agreement.  CODE  STATUS: Full Code  TOTAL TIME TAKING CARE OF THIS PATIENT: 53 minutes.    Gladstone Lighter M.D on 11/17/2018 at 12:35 PM  Between 7am to 6pm - Pager - [phone]  After 6pm go to [email or web address] - password [email or web address] [facility]  [facility]  Office  [phone]  CC: Primary care physician; Jerrol Banana., MD   Note: This dictation was prepared with Dragon dictation along with smaller phrase technology. Any transcriptional errors that result from this process are unintentional.

## 2018-11-17 NOTE — Consult Note (Signed)
Jacob Montes , MD [address], [place], [place], [place], [address] [address] [place], [place], [place], [address] Phone: [phone]  Fax: [phone]  Consultation  Referring Provider:  Dr Tressia Miners  Primary Care Physician:  Jerrol Banana., MD Primary Gastroenterologist:  Vicente Males       Reason for Consultation:   Cholidocholithiasis  Date of Admission:  11/17/2018 Date of Consultation:  11/17/2018         HPI:   Jacob Montes is a 78 y.o. male  Presented to the ER with abdominal pain . S/p cholecystectomy earlier this year .  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  Aortic stenosis status post TAVR in 2019.  On admission abnormal LFT'S and stone 8 mm in size seen in distal CBD. Lipase elevated at 603  He is being evaluated by cardiology for an elevated troponin.  He had a cholecystectomy in January at [facility].  Since then his wife has meticulously documented 4 episodes of abdominal pain associated with nausea chills and Reiger's.  She did mention that he has had a few CT scans at [facility] and also noted to have abnormal liver function tests at some point.  He denies being told for any reason of the same nor does he recall ever having an MRI.  He had a similar episode yesterday and the only differences did not it did not get any better and the pain did not get any better either.  This brought him to the hospital.  At this point of time he still has epigastric pain and right upper quadrant pain.  He denies being on any blood thinners except aspirin.  Past Medical History:  Diagnosis Date  . Aortic regurgitation   . CAD (coronary artery disease)    s/p CABG  . Cataract   . Clotting disorder ([place])   . 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 GERD (gastroesophageal reflux disease)   . Heart murmur   . Hypertension   . Nephrolithiasis 11/16/2018  . Skin cancer   . Sleep apnea   . Stroke [facility])       Prior to Admission medications    Medication Sig Start Date End Date Taking? Authorizing Provider  amLODipine (NORVASC) 5 MG tablet Take 5 mg by mouth daily.   Yes [provider]  amoxicillin (AMOXIL) 500 MG capsule Take 2,000 mg by mouth as directed. Take one hour before dental procedures   Yes [provider]  aspirin 81 MG chewable tablet Chew 81 mg by mouth daily.   Yes [provider]  docusate sodium (COLACE) 250 MG capsule Take 250 mg by mouth daily as needed for constipation.    Yes [provider]  metoprolol tartrate (LOPRESSOR) 25 MG tablet Take 25 mg by mouth 2 (two) times daily.    Yes [provider]  montelukast (SINGULAIR) 10 MG tablet Take 10 mg by mouth at bedtime as needed (allergies).    Yes [provider]  Multiple Vitamin (MULTIVITAMIN) tablet Take 1 tablet by mouth daily.   Yes [provider]  omeprazole (PRILOSEC) 20 MG capsule Take 20 mg by mouth daily.   Yes [provider]  rosuvastatin (CRESTOR) 20 MG tablet Take 20 mg by mouth daily.   Yes [provider]    Family History  Problem Relation Age of Onset  . Hypertension Mother   . Stroke Mother   . Lung cancer Father   . Non-Hodgkin's lymphoma Brother  Social History   Tobacco Use  . Smoking status: Former Research scientist (life sciences)  . Smokeless tobacco: Never Used  Substance Use Topics  . Alcohol use: Never    Frequency: Never  . Drug use: Never    Allergies as of 11/17/2018  . (No Known Allergies)    Review of Systems:    All systems reviewed and negative except where noted in HPI.   Physical Exam:  Vital signs in last 24 hours: Temp:  [98.5 F (36.9 C)] 98.5 F (36.9 C) (10/08 0534) Pulse Rate:  [79-105] 92 (10/08 0911) Resp:  [17-24] 17 (10/08 0911) BP: (141-160)/(53-82) 158/53 (10/08 0911) SpO2:  [90 %-97 %] 94 % (10/08 0911) Weight:  [102.5 kg] 102.5 kg (10/08 0535)   General:   Pleasant, cooperative in NAD Head:  Normocephalic and atraumatic.  Eyes:   No icterus.   Conjunctiva pink. PERRLA. Ears:  Normal auditory acuity. Neck:  Supple; no masses or thyroidomegaly Lungs: Respirations even and unlabored. Lungs clear to auscultation bilaterally.   No wheezes, crackles, or rhonchi.  Heart:  Regular rate and rhythm;  Without murmur, clicks, rubs or gallops Abdomen:  Soft, nondistended, mild epigastric and right upper quadrant tenderness normal bowel sounds. No appreciable masses or hepatomegaly.  No rebound or guarding.  Neurologic:  Alert and oriented x3;  grossly normal neurologically. Skin:  Intact without significant lesions or rashes. Cervical Nodes:  No significant cervical adenopathy. Psych:  Alert and cooperative. Normal affect.  LAB RESULTS: Recent Labs    11/17/18 0220  WBC 14.2*  HGB 15.1  HCT 42.9  PLT 100*   BMET Recent Labs    11/17/18 0220  NA 138  K 4.0  CL 104  CO2 22  GLUCOSE 162*  BUN 22  CREATININE 0.66  CALCIUM 9.2   LFT Recent Labs    11/17/18 0220  PROT 7.4  ALBUMIN 4.1  AST 441*  ALT 484*  ALKPHOS 373*  BILITOT 8.3*   PT/INR Recent Labs    11/17/18 0350  LABPROT 14.4  INR 1.1    STUDIES: Ct Abdomen Pelvis W Contrast  Result Date: 11/17/2018 CLINICAL DATA:  Acute upper abdominal pain EXAM: CT ABDOMEN AND PELVIS WITH CONTRAST TECHNIQUE: Multidetector CT imaging of the abdomen and pelvis was performed using the standard protocol following bolus administration of intravenous contrast. CONTRAST:  158mL OMNIPAQUE IOHEXOL 300 MG/ML  SOLN COMPARISON:  None. FINDINGS: Lower chest:  Aortic valve replacement.  No acute finding Hepatobiliary: No focal liver abnormality.Cholecystectomy with intra and extrahepatic bile duct dilatation. An 8 mm stone is seen at the distal common bile duct. Maximal common bile duct diameter is 12 mm. Pancreas: Unremarkable. Spleen: Remote insult with capsular calcification laterally. Adrenals/Urinary Tract: Negative adrenals. No hydronephrosis or stone.  Unremarkable bladder. Stomach/Bowel: No obstruction. No appendicitis. Sigmoid diverticulosis. Vascular/Lymphatic: No acute vascular abnormality. 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 Question if this was related to the prior splenic trauma. Mild for age atherosclerotic plaque. No mass or adenopathy. Reproductive:Negative Other: No ascites or pneumoperitoneum. Fatty enlargement of the bilateral inguinal canal. Musculoskeletal: No acute abnormalities. 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 Dilated biliary tree with 8 mm distal CBD stone. 2. Cholecystectomy. Electronically Signed   By: Monte Fantasia M.D.   On: 11/17/2018 04:45   US Abdomen Limited Ruq  Result Date: 11/17/2018 CLINICAL DATA:  Right upper quadrant pain since yesterday EXAM: ULTRASOUND ABDOMEN LIMITED  RIGHT UPPER QUADRANT COMPARISON:  None. FINDINGS: Gallbladder: Surgically absent Common bile duct: Diameter: 1 cm.  The distal common bile duct is not visualized. Liver: Intrahepatic bile duct dilatation. No evidence of mass or collection. Portal vein is patent on color Doppler imaging with normal direction of blood flow towards the liver. 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 The stone seen by CT is not visible on this study due to shadowing bowel gas. Electronically Signed   By: Monte Fantasia M.D.   On: 11/17/2018 05:42      Impression / Plan:   Jacob Montes is a 78 y.o. y/o male with a history of cholecystectomy in January 2020 in [place].  Following which he has had at least 4 episodes of fever chills nausea and abdominal pain.  Last episode which began yesterday was what brought him into hospital.  CT scan of the abdomen shows a stone in the common bile duct about 8 mm in size.  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  Total bilirubin is 8.3 with elevated AST and ALT.  Plan 1.  Can have ice chips today cover with IV antibiotics for cholangitis 2.  ERCP tomorrow with drainage of the common bile duct.  I have discussed alternative options, risks & benefits,  which include, but are not limited to, bleeding, infection, perforation,respiratory complication, pancreatitis , drug reaction and death .  The patient agrees with this plan & written consent will be obtained.     Thank you for involving me in the care of this patient.      LOS: 0 days   Jacob Bellows, MD  11/17/2018, 9:25 AM

## 2018-11-17 NOTE — Consult Note (Signed)
Pharmacy Antibiotic Note  Jacob Montes is a 78 y.o. male admitted on 11/17/2018 with Intra-abdominal infection .  Pharmacy has been consulted for Zosyn  Dosing.  Patient received ceftriaxone and flagyl IV x 1 dose in ED.   Plan: Start Zosyn 3.375 IV EI every 8 hours   Height: 5\' 6"  (167.6 cm) Weight: 226 lb (102.5 kg) IBW/kg (Calculated) : 63.8  Temp (24hrs), Avg:98.6 F (37 C), Min:98.5 F (36.9 C), Max:98.7 F (37.1 C)  Recent Labs  Lab 11/17/18 0220 11/17/18 0350 11/17/18 0615  WBC 14.2*  --   --   CREATININE 0.66  --   --   LATICACIDVEN  --  2.4* 2.1*    Estimated Creatinine Clearance: 85.4 mL/min (by C-G formula based on SCr of 0.66 mg/dL).    No Known Allergies  Antimicrobials this admission: 10/8 Ceftriaxone/Flagyl x 1  10/8 Zosyn >>   Thank you for allowing pharmacy to be a part of this patient's care.  Pernell Dupre, PharmD, BCPS Clinical Pharmacist 11/17/2018 1:42 PM

## 2018-11-17 NOTE — ED Notes (Signed)
Patient transported to Ultrasound 

## 2018-11-17 NOTE — Progress Notes (Signed)
*  PRELIMINARY RESULTS* Echocardiogram 2D Echocardiogram has been performed.  Jacob Montes, Jacob Montes 11/17/2018, 11:26 AM

## 2018-11-17 NOTE — Consult Note (Addendum)
Cardiology Consultation:   Patient ID: Jacob Montes; [id number]; 1940/03/21   Admit date: 11/17/2018 Date of Consult: 11/17/2018  Primary Care Provider: Jerrol Montes., MD Primary Cardiologist: [facility] to [facility] - consult by Rockey Situ   Patient Profile:   Jacob Montes is a 78 y.o. male with a hx of CAD s/p 3-vessel CABG in 07/2007, aortic stenosis s/p TAVR in 01/2017, carotid artery stenosis s/p left-sided CEA in [id number] complicated by right laryngeal nerve paralysis, DVT/PE in 08/2010, HTN, HLD intolerant to statins secondary to myalgias though tolerating Crestor, nephrolithiasis, elevated LFTs, diverticulitis, thrombocytopenia, prior tobacco abuse, and OSA no longer using CPAP who is being seen today for the evaluation of elevated troponin at the request of Dr. Tressia Miners.  History of Present Illness:   Jacob Montes previously resided to [place], [place] where he underwent 3-vessel CABG in 07/2007. More recently, he was found to have severe aortic stenosis and underwent TAVR in 03/2017. Work up prior to TAVR included [place] in 12/2016 which his wife indicates showed stable anatomy (details are unclear). He moved [place] the [place] in 05/2018 to be closer to family. He has not yet established with a cardiologist locally.   No detailed cardiac notes/studies for review in Care Everywhere.  He is known to have elevated LFTs dating back to 05/2018 in [place] with ultrasound at that time showing potential residual fluids within the gallbladder fossa and echogenic structure possibly representing a surgical clip vs residual stone with follow up CT demonstrating no residual calculus.    Most recent ischemic evaluation from 02/2018 in [facility] with the wife indicating this was normal. Echo was performed at that time with details being unclear.   Patient has had 4 episodes now of severe abdominal pain over the past several months with most episodes resolving without intervention and associated  with fevers, chills, rigors, nausea, and vomiting. However, this most recent episode was more severe and did not resolve. Never with chest pain, SOB, palpitations, dizziness, presyncope, or syncope.   Vitals stable. EKGs as below, CT abdomen/pelvis showed dilated biliary tree with an 8 mm distal CBD stone, prior cholecystectomy. Abdominal ultrasound showed intra and extrahepatic duct dilatation, in the setting of known choledocholithiasis by prior CT. Stone seen by CT no visible on ultrasound due to bowel gas. Labs showed high sensitivity troponin 511-->705-->1402-->1436, AST 441, ALT 484, T bili 8.3, SCr 0.66, K+ 4.0, lipase 603, WBC 14.2, HGB 15.1, PLT 100, lactic acid 2.4-->2.1, blood culture no growth to date x 2, COVID-19 negative. No cardiac complaints at this time. Abdominal pain is improved with morphine. Preliminary read on the echo shows preserved LVSF.   Past Medical History:  Diagnosis Date  . Aortic stenosis    a. s/p TAVR 03/2017  . CAD (coronary artery disease)    a. s/p 3-v CABG in 07/2007  . Carotid artery disease ([place])    a. s/p left sided CEA complicated by RLN parlaysis   . Cataract   . DVT (deep venous thrombosis) ([place]) 08/2010  . 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 GERD (gastroesophageal reflux disease)   . HLD (hyperlipidemia)   . Hypertension   . Nephrolithiasis 11/16/2018  . Pulmonary embolism ([place]) 08/2010  . Skin cancer   . Sleep apnea   . Stroke [facility])     Home Meds: Prior to Admission medications   Medication Sig Start Date End Date Taking? Authorizing Provider  amLODipine (NORVASC) 5 MG tablet Take 5 mg by mouth daily.  Yes [provider]  amoxicillin (AMOXIL) 500 MG capsule Take 2,000 mg by mouth as directed. Take one hour before dental procedures   Yes [provider]  aspirin 81 MG chewable tablet Chew 81 mg by mouth daily.   Yes [provider]  docusate sodium (COLACE) 250 MG capsule Take 250 mg by mouth daily as needed  for constipation.    Yes [provider]  metoprolol tartrate (LOPRESSOR) 25 MG tablet Take 25 mg by mouth 2 (two) times daily.    Yes [provider]  montelukast (SINGULAIR) 10 MG tablet Take 10 mg by mouth at bedtime as needed (allergies).    Yes [provider]  Multiple Vitamin (MULTIVITAMIN) tablet Take 1 tablet by mouth daily.   Yes [provider]  omeprazole (PRILOSEC) 20 MG capsule Take 20 mg by mouth daily.   Yes [provider]  rosuvastatin (CRESTOR) 20 MG tablet Take 20 mg by mouth daily.   Yes [provider]    Inpatient Medications: Scheduled Meds:  Continuous Infusions:  PRN Meds: morphine injection  Allergies:  No Known Allergies  Social History:   Social History   Socioeconomic History  . Marital status: Married    Spouse name: Not on file  . Number of children: Not on file  . Years of education: Not on file  . Highest education level: Not on file  Occupational History  . Not on file  Social Needs  . Financial resource strain: Not on file  . Food insecurity    Worry: Not on file    Inability: Not on file  . Transportation needs    Medical: Not on file    Non-medical: Not on file  Tobacco Use  . Smoking status: Former Research scientist (life sciences)  . Smokeless tobacco: Never Used  Substance and Sexual Activity  . Alcohol use: Never    Frequency: Never  . Drug use: Never  . Sexual activity: Yes    Partners: Female  Lifestyle  . Physical activity    Days per week: Not on file    Minutes per session: Not on file  . Stress: Not on file  Relationships  . Social Herbalist on phone: Not on file    Gets together: Not on file    Attends religious service: Not on file    Active member of club or organization: Not on file    Attends meetings of clubs or organizations: Not on file    Relationship status: Not on file  . Intimate partner violence    Fear of current or ex partner: Not on file    Emotionally  abused: Not on file    Physically abused: Not on file    Forced sexual activity: Not on file  Other Topics Concern  . Not on file  Social History Narrative  . Not on file     Family History:   Family History  Problem Relation Age of Onset  . Hypertension Mother   . Stroke Mother   . Lung cancer Father   . Non-Hodgkin's lymphoma Brother     ROS:  Review of Systems  Constitutional: Positive for malaise/fatigue. Negative for chills, diaphoresis, fever and weight loss.  HENT: Negative for congestion.   Eyes: Negative for discharge and redness.  Respiratory: Negative for cough, hemoptysis, sputum production, shortness of breath and wheezing.   Cardiovascular: Negative for chest pain, palpitations, orthopnea, claudication, leg swelling and PND.  Gastrointestinal: Positive for abdominal pain,  nausea and vomiting. Negative for blood in stool, heartburn and melena.  Genitourinary: Negative for hematuria.  Musculoskeletal: Positive for back pain. Negative for falls and myalgias.  Skin: Negative for rash.  Neurological: Positive for weakness. Negative for dizziness, tingling, tremors, sensory change, speech change, focal weakness and loss of consciousness.  Endo/Heme/Allergies: Does not bruise/bleed easily.  Psychiatric/Behavioral: Negative for substance abuse. The patient is not nervous/anxious.   All other systems reviewed and are negative.     Physical Exam/Data:   Vitals:   11/17/18 0910 11/17/18 0911 11/17/18 0930 11/17/18 1000  BP:  (!) 158/53 (!) 155/55 (!) 164/67  Pulse: 89 92 90 87  Resp: (!) 23 17 (!) 23 20  Temp:      TempSrc:      SpO2: 95% 94% 95% 92%  Weight:      Height:        Intake/Output Summary (Last 24 hours) at 11/17/2018 1119 Last data filed at 11/17/2018 0746 Gross per 24 hour  Intake -  Output 300 ml  Net -300 ml   Filed Weights   11/17/18 0535  Weight: 102.5 kg   Body mass index is 36.48 kg/m.   Physical Exam: General: Well developed, well  nourished, in no acute distress. Head: Normocephalic, atraumatic, sclera non-icteric, no xanthomas, nares without discharge.  Neck: Negative for carotid bruits. JVD not elevated. Lungs: Clear bilaterally to auscultation without wheezes, rales, or rhonchi. Breathing is unlabored. Heart: RRR with S1 S2. II/VI systolic murmur in the aortic area, no rubs, or gallops appreciated. Abdomen: Soft, diffusely tender to mild palpation, non-distended with normoactive bowel sounds. No hepatomegaly. No rebound/guarding. No obvious abdominal masses. Msk:  Strength and tone appear normal for age. Extremities: No clubbing or cyanosis. No edema. Distal pedal pulses are 2+ and equal bilaterally. Neuro: Alert and oriented X 3. No facial asymmetry. No focal deficit. Moves all extremities spontaneously. Psych:  Responds to questions appropriately with a normal affect.   EKG:  The EKG was personally reviewed and demonstrates:  -11/17/2018; 2:14 - sinus tachycardia, 102 bpm, nonspecific IVCD, poor R wave progression along the precordial leads, nonspecific st/t changes -11/17/2018; 3:40 - NSR, 82 bpm, left axis deviation, LAFB, rare PAC, nonspecific st/t changes Telemetry:  Telemetry was personally reviewed and demonstrates: not on tele  Weights: Filed Weights   11/17/18 0535  Weight: 102.5 kg    Relevant CV Studies: None available for review in Care Everywhere.  2D Echo 11/17/2018: Preliminary read with preserved LVSF.    Laboratory Data:  Chemistry Recent Labs  Lab 11/17/18 0220  NA 138  K 4.0  CL 104  CO2 22  GLUCOSE 162*  BUN 22  CREATININE 0.66  CALCIUM 9.2  GFRNONAA >60  GFRAA >60  ANIONGAP 12    Recent Labs  Lab 11/17/18 0220  PROT 7.4  ALBUMIN 4.1  AST 441*  ALT 484*  ALKPHOS 373*  BILITOT 8.3*   Hematology Recent Labs  Lab 11/17/18 0220  WBC 14.2*  RBC 4.59  HGB 15.1  HCT 42.9  MCV 93.5  MCH 32.9  MCHC 35.2  RDW 13.9  PLT 100*   Cardiac EnzymesNo results for  input(s): TROPONINI in the last 168 hours. No results for input(s): TROPIPOC in the last 168 hours.  BNPNo results for input(s): BNP, PROBNP in the last 168 hours.  DDimer No results for input(s): DDIMER in the last 168 hours.  Radiology/Studies:  Ct Abdomen Pelvis W Contrast  Result Date: 11/17/2018 IMPRESSION: 1. Dilated biliary  tree with 8 mm distal CBD stone. 2. Cholecystectomy. Electronically Signed   By: Monte Fantasia M.D.   On: 11/17/2018 04:45   US Abdomen Limited Ruq  Result Date: 11/17/2018 IMPRESSION: Intra and extrahepatic bile duct dilatation in the setting of known choledocholithiasis by prior CT. The stone seen by CT is not visible on this study due to shadowing bowel gas. Electronically Signed   By: Monte Fantasia M.D.   On: 11/17/2018 05:42    Assessment and Plan:   1. CAD involving the native coronary arteries s/p CABG without angina and with elevated troponin: -Never with chest pain or SOB -Check stat echo to evaluate LVSF and aortic valvular function with preliminary read indicating preserved LVSF -Patient's wife indicates the patient underwent a nuclear stress test in [place] in 02/2018 and states this was normal -Most recent LHC from 12/2016 in the setting of TAVR workup -Elevated troponin likely supply demand ischemia in the setting of his acute illness with CBD stone, fever, chills, rigors, nausea, and vomiting -No plans for inpatient ischemic evaluation at this time -Consider outpatient stress testing once his acute illness is resolved in follow up -ASA -Metoprolol  -Crestor on hold as below  2. Preoperative cardiac risk assessment: -Await formal read on echo -Able to achieve > 4.0 METs -He is moderate risk for noncardiac surgery per modified Lee criteria -No further testing will reduce this risk in the perioperative time frame -If formal read on echo demonstrates preserved LV systolic function patient can proceed with noncardiac procedure at overall moderate  risk  3. Aortic valve stenosis s/p TAVR: -Update echo as above -Outpatient follow up with our structural heart program  -ASA -SBE prophylaxis is needed prior to procedure   4. Carotid artery disease: -Status post left-sided CEA -ASA -Crestor on hold as below -Outpatient follow up  5. HLD: -Crestor on hold for now given elevated LFTs in the setting of his acute illness, resume when able -Recent LDL of 69 from 10/2018 -Goal LDL < 70  6. HTN: -Blood pressure elevated, likely in the setting of his acute illness -Continue PTA amlodipine and Lopressor  -Monitor  7. CBD stone/elevated LFTs/hyperbilirubinemia: -Per GI  8. OSA: -No longer wearing CPAP -Outpatient follow up   For questions or updates, please contact [place] Please consult [email or web address] for contact info under Cardiology/STEMI.   Signed, Christell Faith, PA-C [place] Pager: [phone] 11/17/2018, 11:19 AM

## 2018-11-17 NOTE — Progress Notes (Signed)
PHARMACY - PHYSICIAN COMMUNICATION CRITICAL VALUE ALERT - BLOOD CULTURE IDENTIFICATION (BCID)  Jacob Montes is an 78 y.o. male who presented to [facility] on 11/17/2018 with a chief complaint of Abdominal pain.  Assessment: Patient admitted for sepsis secondary to acute cholangitis. Bcx 1 of 4 GPC. BCID shows Enterococcus, van A/B no detected.   Name of physician (or Provider) Contacted: Dr. Posey Pronto  Current antibiotics: Zosyn   Changes to prescribed antibiotics recommended:  Abx will be changed to Unasyn   Results for orders placed or performed during the hospital encounter of 11/17/18  Blood Culture ID Panel (Reflexed) (Collected: 11/17/2018  3:50 AM)  Result Value Ref Range   Enterococcus species DETECTED (A) NOT DETECTED   Vancomycin resistance NOT DETECTED NOT DETECTED   Listeria monocytogenes NOT DETECTED NOT DETECTED   Staphylococcus species NOT DETECTED NOT DETECTED   Staphylococcus aureus (BCID) NOT DETECTED NOT DETECTED   Streptococcus species NOT DETECTED NOT DETECTED   Streptococcus agalactiae NOT DETECTED NOT DETECTED   Streptococcus pneumoniae NOT DETECTED NOT DETECTED   Streptococcus pyogenes NOT DETECTED NOT DETECTED   Acinetobacter baumannii NOT DETECTED NOT DETECTED   Enterobacteriaceae species NOT DETECTED NOT DETECTED   Enterobacter cloacae complex NOT DETECTED NOT DETECTED   Escherichia coli NOT DETECTED NOT DETECTED   Klebsiella oxytoca NOT DETECTED NOT DETECTED   Klebsiella pneumoniae NOT DETECTED NOT DETECTED   Proteus species NOT DETECTED NOT DETECTED   Serratia marcescens NOT DETECTED NOT DETECTED   Haemophilus influenzae NOT DETECTED NOT DETECTED   Neisseria meningitidis NOT DETECTED NOT DETECTED   Pseudomonas aeruginosa NOT DETECTED NOT DETECTED   Candida albicans NOT DETECTED NOT DETECTED   Candida glabrata NOT DETECTED NOT DETECTED   Candida krusei NOT DETECTED NOT DETECTED   Candida parapsilosis NOT DETECTED NOT DETECTED   Candida tropicalis NOT  DETECTED NOT [place], PharmD, BCPS Clinical Pharmacist 11/17/2018 7:35 PM

## 2018-11-17 NOTE — Progress Notes (Signed)
CODE SEPSIS - PHARMACY COMMUNICATION  **Broad Spectrum Antibiotics should be administered within 1 hour of Sepsis diagnosis**  Time Code Sepsis Called/Page Received: [id number]  Antibiotics Ordered: ceftriaxone/metronidazole  Time of 1st antibiotic administration: 0520  Additional action taken by pharmacy:   If necessary, Name of Provider/Nurse Contacted:     Tobie Lords ,PharmD Clinical Pharmacist  11/17/2018  6:55 AM

## 2018-11-17 NOTE — ED Provider Notes (Signed)
[facility] Emergency Department Provider Note ____   First MD Initiated Contact with Patient 11/17/18 [phone]     (approximate)  I have reviewed the triage vital signs and the nursing notes.   HISTORY  Chief Complaint Abdominal Pain    HPI Jacob Montes is a 78 y.o. male with below list of previous medical condition including cholecystectomy performed this year secondary to gallstones in [place].  Patient has had multiple ED visits since then secondary to epigastric abdominal pain and vomiting.  Patient states tonight pain is been persistent with associated vomiting.  Patient also admits to fever.  Patient denies any diarrhea no constipation.  Patient denies any urinary symptoms.     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 Authorizing Provider  amLODipine (NORVASC) 5 MG tablet Take 5 mg by mouth daily.    [provider]  amoxicillin (AMOXIL) 250 MG capsule Take 250 mg by mouth 3 (three) times daily. Take before dental procedures    [provider]    aspirin 81 MG chewable tablet Chew 81 mg by mouth daily.    [provider]  docusate sodium (COLACE) 250 MG capsule Take 250 mg by mouth daily.    [provider]  metoprolol tartrate (LOPRESSOR) 25 MG tablet Take 25 mg by mouth daily.    [provider]  montelukast (SINGULAIR) 10 MG tablet Take 10 mg by mouth at bedtime.    [provider]  Multiple Vitamin (MULTIVITAMIN) tablet Take 1 tablet by mouth daily.    [provider]  omeprazole (PRILOSEC) 20 MG capsule Take 20 mg by mouth daily.    [provider]  rosuvastatin (CRESTOR) 20 MG tablet Take 20 mg by mouth daily.    [provider]    Allergies Patient has no allergy information on record.  Family History  Problem Relation Age of Onset   Hypertension Mother     Social History Social History   Tobacco Use   Smoking status: Former Smoker   Smokeless tobacco: Never Used  Substance Use Topics   Alcohol use: Never    Frequency: Never   Drug use: Never    Review of Systems Constitutional: No fever/chills Eyes: No visual changes. ENT: No sore throat. Cardiovascular: Denies chest pain. Respiratory: Denies shortness of breath. Gastrointestinal: Positive for abdominal pain and vomiting no diarrhea.  No constipation. Genitourinary: Negative for dysuria. Musculoskeletal: Negative for neck pain.  Negative for back pain. Integumentary: Negative for rash. Neurological: Negative for headaches, focal weakness or numbness.   ____________________________________________   PHYSICAL EXAM:  VITAL SIGNS: ED Triage Vitals  Enc Vitals Group     BP 11/17/18 0404 (!) 157/56     Pulse Rate 11/17/18 0345 84     Resp 11/17/18 0345 19     Temp 11/17/18 0534 98.5 F (36.9 C)     Temp Source 11/17/18 0534 Oral     SpO2 11/17/18 0345 97 %     Weight 11/17/18 0535 102.5 kg (226 lb)     Height 11/17/18 0535 1.676 m (5\' 6" )     Head Circumference --      Peak Flow  --      Pain Score 11/17/18 0534 1     Pain Loc --      Pain Edu? --      Excl. in [place]? --     Constitutional: Alert and oriented.  Eyes: Conjunctivae are normal.  Scleral icterus Mouth/Throat: Mucous membranes are moist. Neck: No stridor.  No meningeal signs.   Cardiovascular: Normal rate, regular rhythm. Good peripheral circulation. Grossly normal heart sounds. Respiratory: Normal respiratory effort.  No retractions. Gastrointestinal: Right upper quadrant/epigastric tenderness to palpation.. No distention.  Musculoskeletal: No lower extremity tenderness nor edema. No gross deformities of extremities. Neurologic:  Normal speech and language. No gross focal neurologic deficits are appreciated.  Skin:  Skin is warm, dry and intact. Psychiatric: Mood and affect are normal. Speech and behavior are normal.  ____________________________________________   LABS (all labs ordered are listed, but only abnormal results are displayed)  Labs Reviewed  CBC WITH DIFFERENTIAL/PLATELET - Abnormal; Notable for the following components:      Result Value   WBC 14.2 (*)    Platelets 100 (*)    Neutro Abs 12.4 (*)    All other components within normal limits  COMPREHENSIVE METABOLIC PANEL - Abnormal; Notable for the following components:   Glucose, Bld 162 (*)    AST 441 (*)    ALT 484 (*)    Alkaline Phosphatase 373 (*)    Total Bilirubin 8.3 (*)    All other components within normal limits  LIPASE, BLOOD - Abnormal; Notable for the following components:   Lipase 603 (*)    All other components within normal limits  LACTIC ACID, PLASMA - Abnormal; Notable for the following components:   Lactic Acid, Venous 2.4 (*)    All other components within normal limits  TROPONIN I (HIGH SENSITIVITY) - Abnormal; Notable for the following components:   Troponin I (High Sensitivity) 511 (*)    All other components within normal limits  CULTURE, BLOOD (ROUTINE X 2)  CULTURE, BLOOD (ROUTINE X 2)  SARS  CORONAVIRUS 2 (HOSPITAL ORDER, [facility])  APTT  PROTIME-INR  LACTIC ACID, PLASMA  TROPONIN I (HIGH SENSITIVITY)  TROPONIN I (HIGH SENSITIVITY)   ____________________________________________  EKG  ED ECG REPORT I, [place] N [place], the attending physician, personally viewed and interpreted this ECG.   Date: 11/17/2018  EKG Time: 2:14 AM  Rate: 82  Rhythm: Normal sinus rhythm  Axis: Normal  Intervals: Normal  ST&T Change: None  ED ECG REPORT I, [place] N Per Beagley, the attending physician, personally viewed and interpreted this ECG.   Date: 11/17/2018  EKG Time: 3:40 AM  Rate: 82  Rhythm: Normal sinus rhythm  Axis: Normal  Intervals: Normal  ST&T Change: None   ____________________________________________  RADIOLOGY I,  N Charline Hoskinson, personally viewed and evaluated these images (plain radiographs) as part of my medical decision making, as well as reviewing the written report by the radiologist.  ED MD interpretation: Dilated biliary tree with an 8 mm distal CBD stone  Official radiology report(s): Ct Abdomen Pelvis W Contrast  Result Date: 11/17/2018 CLINICAL DATA:  Acute upper abdominal pain EXAM: CT ABDOMEN AND PELVIS WITH CONTRAST TECHNIQUE: Multidetector CT imaging of the abdomen and pelvis was performed using the standard protocol following bolus administration of intravenous contrast. CONTRAST:  164mL OMNIPAQUE IOHEXOL 300 MG/ML  SOLN COMPARISON:  None. FINDINGS: Lower chest:  Aortic valve replacement.  No acute finding Hepatobiliary: No focal liver abnormality.Cholecystectomy with intra and extrahepatic bile duct dilatation. An 8 mm stone is seen at the distal common bile duct. Maximal common bile duct diameter is 12 mm. Pancreas: Unremarkable. Spleen: Remote insult with capsular calcification laterally. Adrenals/Urinary Tract: Negative adrenals. No hydronephrosis or stone. Unremarkable bladder. Stomach/Bowel: No obstruction. No  appendicitis. Sigmoid diverticulosis. Vascular/Lymphatic: No acute vascular abnormality. 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 Question if this was related to the prior splenic trauma. Mild for age atherosclerotic plaque. No mass or adenopathy. Reproductive:Negative Other: No ascites or pneumoperitoneum. Fatty enlargement of the bilateral inguinal canal. Musculoskeletal: No acute abnormalities. 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 Dilated biliary tree with 8 mm distal CBD stone. 2. Cholecystectomy. Electronically Signed   By: Monte Fantasia M.D.   On: 11/17/2018 04:45   US Abdomen Limited Ruq  Result Date: 11/17/2018 CLINICAL DATA:  Right upper quadrant pain since yesterday EXAM: ULTRASOUND ABDOMEN LIMITED RIGHT UPPER QUADRANT COMPARISON:  None. FINDINGS: Gallbladder: Surgically absent Common bile duct: Diameter: 1 cm.  The distal common bile duct is not visualized. Liver: Intrahepatic bile duct dilatation. No evidence of mass or collection. Portal vein is patent on color Doppler imaging with normal direction of blood flow towards the liver. 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 The stone seen by CT is not visible on this study due to shadowing bowel gas. Electronically Signed   By: Monte Fantasia M.D.   On: 11/17/2018 05:42    ____________________________________________   PROCEDURES    .Critical Care Performed by: Gregor Hams, MD Authorized by: Gregor Hams, MD   Critical care provider statement:    Critical care time (minutes):  45   Critical care time was exclusive of:  Separately billable procedures and treating other patients (Ascending cholangitis)   Critical care was necessary to treat or prevent imminent or life-threatening deterioration of the following conditions:   Sepsis   Critical care was time spent personally by me on the following activities:  Development of treatment plan with patient or surrogate, discussions with consultants, evaluation of patient's response to treatment, examination of patient, obtaining history from patient or surrogate, ordering and performing treatments and interventions, ordering and review of laboratory studies, ordering and review of radiographic studies, pulse oximetry, re-evaluation of patient's condition and review of old charts     ____________________________________________   INITIAL IMPRESSION / MDM / [place] / ED COURSE  As part of my medical decision making, I reviewed the following data within the electronic MEDICAL RECORD NUMBER   78 year old male presenting with above-stated history and physical exam concerning for choledocholithiasis with possibly ascending cholangitis.  A such CT scan was performed which revealed a 8 mm distal common bile duct stone with biliary tree dilation.  Patient's liver enzymes and lipase elevated.  In addition patient's lactic 2.4 as such  sepsis protocol was initiated patient received appropriate IV antibiotic therapy.  .  Patient and his wife are notified of all clinical findings.  In addition patient's troponin noted to be elevated at 511 without any EKG changes consistent with a STEMI.  Patient discussed with Dr. Danella Penton for hospital admission further evaluation and management.  ____________________________________________  FINAL CLINICAL IMPRESSION(S) / ED DIAGNOSES  Final diagnoses:  RUQ pain     MEDICATIONS GIVEN DURING THIS VISIT:  Medications  metroNIDAZOLE (FLAGYL) IVPB 500 mg (500 mg Intravenous New Bag/Given 11/17/18 0534)  morphine 4 MG/ML injection 4 mg (4 mg Intravenous Given 11/17/18 0458)  ondansetron (ZOFRAN) injection 4 mg (4 mg Intravenous Given 11/17/18 0458)  cefTRIAXone (ROCEPHIN) 2 g in sodium chloride 0.9 % 100 mL IVPB (0 g Intravenous Stopped 11/17/18  0601)  iohexol (OMNIPAQUE) 300 MG/ML solution 100 mL (100 mLs Intravenous Contrast Given 11/17/18 0407)     ED Discharge Orders    None      *Please note:  Zarion Hinsdale was evaluated in Emergency Department on 11/17/2018 for the symptoms described in the history of present illness. He was evaluated in the context of the global COVID-19 pandemic, which necessitated consideration that the patient might be at risk for infection with the SARS-CoV-2 virus that causes COVID-19. Institutional protocols and algorithms that pertain to the evaluation of patients at risk for COVID-19 are in a state of rapid change based on information released by regulatory bodies including the CDC and federal and state organizations. These policies and algorithms were followed during the patient's care in the ED.  Some ED evaluations and interventions may be delayed as a result of limited staffing during the pandemic.*  Note:  This document was prepared using Dragon voice recognition software and may include unintentional dictation errors.   Gregor Hams, MD 11/17/18 [id number]

## 2018-11-17 NOTE — ED Notes (Signed)
Patient in CT

## 2018-11-17 NOTE — ED Notes (Signed)
Wife at bedside, echo in progress at bedside.

## 2018-11-17 NOTE — ED Notes (Signed)
Date and time results received: 11/17/18    Test: Troponin Critical Value: 705  Name of Provider Notified: Dr Cinda Quest

## 2018-11-17 NOTE — ED Notes (Signed)
This RN and MD have called multiple times regarding repeat troponin, lab did not know they had tube/couldn't find it, said they will run. MD aware

## 2018-11-17 NOTE — ED Notes (Signed)
Pt c/o abdominal cramping, asking for a drink states MD gave clearance for him to drink something. Wife at bedside, order for morphine in chart, given per order. Pt repositioned in bed.

## 2018-11-17 NOTE — ED Notes (Signed)
Report to [place], [place]. Pt clean and dry upon leaving ER. Pt in NAD, RR even and unlabored, alert.

## 2018-11-17 NOTE — ED Notes (Addendum)
Dr Tressia Miners notified by secure chat pts troponin increased to 1402 - Dr. Tressia Miners acknowledged chat, will order cardiology  consult

## 2018-11-17 NOTE — ED Notes (Signed)
Date and time results received: 11/17/18 0430 (use smartphrase ".now" to insert current time)  Test: lactic acid Critical Value: 2.4  Name of Provider Notified: Fann Shark, MD  Orders Received? Or Actions Taken?: Orders Received - See Orders for details

## 2018-11-17 NOTE — ED Triage Notes (Signed)
See paper chart for downtime 

## 2018-11-18 ENCOUNTER — Inpatient Hospital Stay: Payer: Medicare Other

## 2018-11-18 ENCOUNTER — Inpatient Hospital Stay: Payer: Medicare Other | Admitting: Anesthesiology

## 2018-11-18 ENCOUNTER — Encounter: Payer: Self-pay | Admitting: *Deleted

## 2018-11-18 ENCOUNTER — Encounter
Admission: EM | Disposition: A | Discharge status: Home Health | Payer: Self-pay | Source: Home / Self Care | Attending: Internal Medicine

## 2018-11-18 DIAGNOSIS — Z79899 Other long term (current) drug therapy: Secondary | ICD-10-CM

## 2018-11-18 DIAGNOSIS — K8050 Calculus of bile duct without cholangitis or cholecystitis without obstruction: Secondary | ICD-10-CM

## 2018-11-18 DIAGNOSIS — B952 Enterococcus as the cause of diseases classified elsewhere: Secondary | ICD-10-CM

## 2018-11-18 DIAGNOSIS — R7881 Bacteremia: Secondary | ICD-10-CM

## 2018-11-18 DIAGNOSIS — I2510 Atherosclerotic heart disease of native coronary artery without angina pectoris: Secondary | ICD-10-CM

## 2018-11-18 DIAGNOSIS — Z87891 Personal history of nicotine dependence: Secondary | ICD-10-CM

## 2018-11-18 DIAGNOSIS — Z951 Presence of aortocoronary bypass graft: Secondary | ICD-10-CM

## 2018-11-18 DIAGNOSIS — R945 Abnormal results of liver function studies: Secondary | ICD-10-CM

## 2018-11-18 DIAGNOSIS — I10 Essential (primary) hypertension: Secondary | ICD-10-CM

## 2018-11-18 DIAGNOSIS — K8030 Calculus of bile duct with cholangitis, unspecified, without obstruction: Secondary | ICD-10-CM

## 2018-11-18 DIAGNOSIS — Z9049 Acquired absence of other specified parts of digestive tract: Secondary | ICD-10-CM

## 2018-11-18 DIAGNOSIS — K851 Biliary acute pancreatitis without necrosis or infection: Secondary | ICD-10-CM

## 2018-11-18 HISTORY — PX: ERCP: SHX5425

## 2018-11-18 LAB — CBC
HCT: 42.2 % (ref 39.0–52.0)
Hemoglobin: 14.4 g/dL (ref 13.0–17.0)
MCH: 32.8 pg (ref 26.0–34.0)
MCHC: 34.1 g/dL (ref 30.0–36.0)
MCV: 96.1 fL (ref 80.0–100.0)
Platelets: 78 10*3/uL — ABNORMAL LOW (ref 150–400)
RBC: 4.39 MIL/uL (ref 4.22–5.81)
RDW: 13.9 % (ref 11.5–15.5)
WBC: 16.1 10*3/uL — ABNORMAL HIGH (ref 4.0–10.5)
nRBC: 0 % (ref 0.0–0.2)

## 2018-11-18 LAB — COMPREHENSIVE METABOLIC PANEL
ALT: 296 U/L — ABNORMAL HIGH (ref 0–44)
AST: 188 U/L — ABNORMAL HIGH (ref 15–41)
Albumin: 3.5 g/dL (ref 3.5–5.0)
Alkaline Phosphatase: 286 U/L — ABNORMAL HIGH (ref 38–126)
Anion gap: 10 (ref 5–15)
BUN: 21 mg/dL (ref 8–23)
CO2: 24 mmol/L (ref 22–32)
Calcium: 8.5 mg/dL — ABNORMAL LOW (ref 8.9–10.3)
Chloride: 103 mmol/L (ref 98–111)
Creatinine, Ser: 0.52 mg/dL — ABNORMAL LOW (ref 0.61–1.24)
GFR calc Af Amer: >60 mL/min (ref >60)
GFR calc non Af Amer: >60 mL/min (ref >60)
Glucose, Bld: 127 mg/dL — ABNORMAL HIGH (ref 70–99)
Potassium: 4.1 mmol/L (ref 3.5–5.1)
Sodium: 137 mmol/L (ref 135–145)
Total Bilirubin: 3.5 mg/dL — ABNORMAL HIGH (ref 0.3–1.2)
Total Protein: 6.6 g/dL (ref 6.5–8.1)

## 2018-11-18 LAB — LIPASE, BLOOD: Lipase: 31 U/L (ref 11–51)

## 2018-11-18 SURGERY — ENDOSCOPIC RETROGRADE CHOLANGIOPANCREATOGRAPHY (ERCP) WITH PROPOFOL
Anesthesia: General

## 2018-11-18 SURGERY — ERCP, WITH INTERVENTION IF INDICATED
Anesthesia: General

## 2018-11-18 MED ORDER — PROPOFOL 10 MG/ML IV BOLUS
INTRAVENOUS | Status: DC | PRN
  Administered 2018-11-18: 100 mg via INTRAVENOUS

## 2018-11-18 MED ORDER — SUGAMMADEX SODIUM 200 MG/2ML IV SOLN
INTRAVENOUS | Status: DC | PRN
  Administered 2018-11-18: 205 mg via INTRAVENOUS

## 2018-11-18 MED ORDER — ONDANSETRON HCL 4 MG/2ML IJ SOLN: 4.0000 mg | Freq: Once | INTRAMUSCULAR | Status: DC | PRN

## 2018-11-18 MED ORDER — SUCCINYLCHOLINE CHLORIDE 20 MG/ML IJ SOLN
INTRAMUSCULAR | Status: DC | PRN
  Administered 2018-11-18: 120 mg via INTRAVENOUS

## 2018-11-18 MED ORDER — FENTANYL CITRATE (PF) 100 MCG/2ML IJ SOLN: 25.0000 ug | INTRAMUSCULAR | Status: DC | PRN

## 2018-11-18 MED ORDER — DEXAMETHASONE SODIUM PHOSPHATE 10 MG/ML IJ SOLN
INTRAMUSCULAR | Status: DC | PRN
  Administered 2018-11-18: 10 mg via INTRAVENOUS

## 2018-11-18 MED ORDER — LIDOCAINE HCL (CARDIAC) PF 100 MG/5ML IV SOSY
PREFILLED_SYRINGE | INTRAVENOUS | Status: DC | PRN
  Administered 2018-11-18: 100 mg via INTRAVENOUS

## 2018-11-18 MED ORDER — LABETALOL HCL 5 MG/ML IV SOLN
10.0000 mg | Freq: Four times a day (QID) | INTRAVENOUS | Status: DC | PRN
  Administered 2018-11-18 – 2018-11-19 (×2): 10 mg via INTRAVENOUS
  Filled 2018-11-18 (×2): qty 4

## 2018-11-18 MED ORDER — ROCURONIUM BROMIDE 100 MG/10ML IV SOLN: INTRAVENOUS | Status: DC | PRN

## 2018-11-18 MED ORDER — LACTATED RINGERS IV SOLN
INTRAVENOUS | Status: DC
  Administered 2018-11-18 (×2): via INTRAVENOUS

## 2018-11-18 MED ORDER — INDOMETHACIN 50 MG RE SUPP
100.0000 mg | Freq: Once | RECTAL | Status: AC
  Administered 2018-11-18: 100 mg via RECTAL

## 2018-11-18 MED ORDER — ROCURONIUM BROMIDE 100 MG/10ML IV SOLN
INTRAVENOUS | Status: DC | PRN
  Administered 2018-11-18: 10 mg via INTRAVENOUS
  Administered 2018-11-18: 40 mg via INTRAVENOUS

## 2018-11-18 NOTE — Progress Notes (Addendum)
[facility] [place] NAME: Jacob Montes    MR#:  [id number]  DATE OF BIRTH:  [birth year]  SUBJECTIVE:  CHIEF COMPLAINT:   Chief Complaint  Patient presents with   Abdominal Pain   -Still complaining of abdominal pain and nausea. -For ERCP today. -Blood cultures positive for enterococcus  REVIEW OF SYSTEMS:  Review of Systems  Constitutional: Negative for chills, fever and malaise/fatigue.  HENT: Negative for congestion, ear discharge, hearing loss and nosebleeds.   Eyes: Negative for blurred vision and double vision.  Respiratory: Negative for cough, shortness of breath and wheezing.   Cardiovascular: Negative for chest pain and palpitations.  Gastrointestinal: Positive for abdominal pain and nausea. Negative for constipation, diarrhea and vomiting.  Genitourinary: Negative for dysuria.  Musculoskeletal: Negative for myalgias.  Neurological: Negative for dizziness, focal weakness, seizures, weakness and headaches.  Psychiatric/Behavioral: Negative for depression.    DRUG ALLERGIES:  No Known Allergies  VITALS:  Blood pressure (!) 175/69, pulse 95, temperature 99.2 F (37.3 C), temperature source Oral, resp. rate 18, height 5\' 6"  (1.676 m), weight 102.5 kg, SpO2 96 %.  PHYSICAL EXAMINATION:  Physical Exam  GENERAL:  78 y.o.-year-old patient lying in the bed with no acute distress.  EYES: Pupils equal, round, reactive to light and accommodation. No scleral icterus. Extraocular muscles intact.  HEENT: Head atraumatic, normocephalic. Oropharynx and nasopharynx clear.  NECK:  Supple, no jugular venous distention. No thyroid enlargement, no tenderness.  LUNGS: Normal breath sounds bilaterally, no wheezing, rales,rhonchi or crepitation. No use of accessory muscles of respiration.  Decreased bibasilar breath sounds CARDIOVASCULAR: S1, S2 normal. No rubs, or gallops.  Loud 3/6 systolic murmur is present at the base of the  heart ABDOMEN: Soft, tender in the Epigastric and right upper quadrant regions, nondistended. Bowel sounds present. No organomegaly or mass.  EXTREMITIES: No pedal edema, cyanosis, or clubbing.  NEUROLOGIC: Cranial nerves II through XII are intact. Muscle strength 5/5 in all extremities. Sensation intact. Gait not checked.  PSYCHIATRIC: The patient is alert and oriented x 3.  SKIN: No obvious rash, lesion, or ulcer.    LABORATORY PANEL:   CBC Recent Labs  Lab 11/18/18 0441  WBC 16.1*  HGB 14.4  HCT 42.2  PLT 78*   ------------------------------------------------------------------------------------------------------------------  Chemistries  Recent Labs  Lab 11/18/18 0441  NA 137  K 4.1  CL 103  CO2 24  GLUCOSE 127*  BUN 21  CREATININE 0.52*  CALCIUM 8.5*  AST 188*  ALT 296*  ALKPHOS 286*  BILITOT 3.5*   ------------------------------------------------------------------------------------------------------------------  Cardiac Enzymes No results for input(s): TROPONINI in the last 168 hours. ------------------------------------------------------------------------------------------------------------------  RADIOLOGY:  Ct Abdomen Pelvis W Contrast  Result Date: 11/17/2018 CLINICAL DATA:  Acute upper abdominal pain EXAM: CT ABDOMEN AND PELVIS WITH CONTRAST TECHNIQUE: Multidetector CT imaging of the abdomen and pelvis was performed using the standard protocol following bolus administration of intravenous contrast. CONTRAST:  188mL OMNIPAQUE IOHEXOL 300 MG/ML  SOLN COMPARISON:  None. FINDINGS: Lower chest:  Aortic valve replacement.  No acute finding Hepatobiliary: No focal liver abnormality.Cholecystectomy with intra and extrahepatic bile duct dilatation. An 8 mm stone is seen at the distal common bile duct. Maximal common bile duct diameter is 12 mm. Pancreas: Unremarkable. Spleen: Remote insult with capsular calcification laterally. Adrenals/Urinary Tract: Negative  adrenals. No hydronephrosis or stone. Unremarkable bladder. Stomach/Bowel: No obstruction. No appendicitis. Sigmoid diverticulosis. Vascular/Lymphatic: No acute vascular abnormality. 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  Question if this was related to the prior splenic trauma. Mild for age atherosclerotic plaque. No mass or adenopathy. Reproductive:Negative Other: No ascites or pneumoperitoneum. Fatty enlargement of the bilateral inguinal canal. Musculoskeletal: No acute abnormalities. 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 Dilated biliary tree with 8 mm distal CBD stone. 2. Cholecystectomy. Electronically Signed   By: Monte Fantasia M.D.   On: 11/17/2018 04:45   US Abdomen Limited Ruq  Result Date: 11/17/2018 CLINICAL DATA:  Right upper quadrant pain since yesterday EXAM: ULTRASOUND ABDOMEN LIMITED RIGHT UPPER QUADRANT COMPARISON:  None. FINDINGS: Gallbladder: Surgically absent Common bile duct: Diameter: 1 cm.  The distal common bile duct is not visualized. Liver: Intrahepatic bile duct dilatation. No evidence of mass or collection. Portal vein is patent on color Doppler imaging with normal direction of blood flow towards the liver. 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 The stone seen by CT is not visible on this study due to shadowing bowel gas. Electronically Signed   By: Monte Fantasia M.D.   On: 11/17/2018 05:42    EKG:   Orders placed or performed during the hospital encounter of 11/17/18   EKG 12-Lead   EKG 12-Lead   ED EKG 12-Lead   ED EKG 12-Lead   EKG    ASSESSMENT AND PLAN:   Jacob Montes  is a 78 y.o. 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  1.  Sepsis-secondary to acute cholangitis and enterococcus bacteremia. -Elevated LFTs secondary to the same.  GI has been consulted.  CT showing 8 mm stone in CBD with CBD dilatation.  -For MRCP today -On IV antibiotics with Unasyn  2.    Enterococcus bacteremia-likely source intra-abdominal -Given his TAVR, he has a prosthetic aortic valve implantation.  Transthoracic echo negative for any vegetations. -Will need transesophageal echocardiogram.  Likely scheduled for Monday -ID consult.  Continue antibiotics  3.  Elevated troponins-possible demand ischemia.  Appreciate cardiology consult -TTE with LVH but preserved EF.  No vegetations noted.  No further inpatient cardiac work-up.  4.  CAD status post CABG-no chest pain or shortness of breath.   - patient is status post TAVR -Continue cardiac medications.  Appreciate cardiology consult.  5.  Hypertension-on Norvasc, metoprolol  6.  DVT prophylaxis-Lovenox    Patient is independent at baseline. Wife updated over the phone today   All the records are reviewed and case discussed with Care Management/Social Workerr. Management plans discussed with the patient, family and they are in agreement.  CODE STATUS:  Full Code  TOTAL TIME TAKING CARE OF THIS PATIENT: 38 minutes.   POSSIBLE D/C IN 3-4 DAYS, DEPENDING ON CLINICAL CONDITION.   Gladstone Lighter M.D on 11/18/2018 at 10:45 AM  Between 7am to 6pm - Pager - [phone]  After 6pm go to [email or web address] - password [email or web address] [facility]  Office  [phone]  CC: Primary care physician; Jerrol Banana., MD

## 2018-11-18 NOTE — Anesthesia Post-op Follow-up Note (Signed)
Anesthesia QCDR form completed.        

## 2018-11-18 NOTE — Anesthesia Preprocedure Evaluation (Signed)
Anesthesia Evaluation  Patient identified by MRN, date of birth, ID band Patient awake    Reviewed: Allergy & Precautions, H&P , NPO status , Patient's Chart, lab work & pertinent test results, reviewed documented beta blocker date and time   History of Anesthesia Complications Negative for: history of anesthetic complications  Airway Mallampati: III  TM Distance: >3 FB Neck ROM: full    Dental  (+) Dental Advidsory Given, Teeth Intact   Pulmonary neg shortness of breath, sleep apnea , neg COPD, neg recent URI, former smoker,    Pulmonary exam normal        Cardiovascular Exercise Tolerance: Good hypertension, (-) angina+ CAD and + CABG  (-) Past MI and (-) Cardiac Stents Normal cardiovascular exam(-) dysrhythmias + Valvular Problems/Murmurs (s/p TAVR) AS      Neuro/Psych neg Seizures TIAnegative psych ROS   GI/Hepatic Neg liver ROS, GERD  ,  Endo/Other  negative endocrine ROS  Renal/GU Renal disease (kidney stones)  negative genitourinary   Musculoskeletal   Abdominal   Peds  Hematology negative hematology ROS (+)   Anesthesia Other Findings Past Medical History: No date: Aortic stenosis     Comment:  a. s/p TAVR 03/2017 No date: CAD (coronary artery disease)     Comment:  a. s/p 3-v CABG in 07/2007 No date: Carotid artery disease ([place])     Comment:  a. s/p left sided CEA complicated by RLN parlaysis  No date: Cataract 08/2010: DVT (deep venous thrombosis) ([place]) 11/16/2018: Generalized osteoarthritis No date: GERD (gastroesophageal reflux disease) No date: HLD (hyperlipidemia) No date: Hypertension 11/16/2018: Nephrolithiasis 08/2010: Pulmonary embolism ([place]) No date: Skin cancer No date: Sleep apnea No date: Stroke ([place])   Reproductive/Obstetrics negative OB ROS                             Anesthesia Physical Anesthesia Plan  ASA: III  Anesthesia Plan: General   Post-op Pain  Management:    Induction: Intravenous  PONV Risk Score and Plan: 2 and Ondansetron, Dexamethasone and Treatment may vary due to age or medical condition  Airway Management Planned: Oral ETT  Additional Equipment:   Intra-op Plan:   Post-operative Plan: Extubation in OR  Informed Consent: I have reviewed the patients History and Physical, chart, labs and discussed the procedure including the risks, benefits and alternatives for the proposed anesthesia with the patient or authorized representative who has indicated his/her understanding and acceptance.     Dental Advisory Given  Plan Discussed with: Anesthesiologist, CRNA and Surgeon  Anesthesia Plan Comments:         Anesthesia Quick Evaluation

## 2018-11-18 NOTE — Consult Note (Signed)
NAME: Jacob Montes  DOB: Jul 31, 1940  MRN: [id number]  Date/Time: 11/18/2018 7:20 AM  REQUESTING PROVIDER: Tressia Miners Subjective:  REASON FOR CONSULT: enterococcus abcteremia ? Jacob Montes is a 78 y.o. male with a history of CAD, TAVR for aortic stenosis, cholecystectomy admitted with abdominal pain . Pt had cholecystectomy in Jan this year. He has had  Episodes of abdominal pain since then and the last one was yesterday and was severe associated with fever , nausea and vomiting . In the ED he had abnormal Lfts and Ct showed a 65mm stone in distal CBD. He was started on zosyn. Blood culture came back positive for enterococcus I am seeing the patient for the same/ He underwent ERCP and removal of stone by Dr.Wohl and is feeling fine now Past Medical History:  Diagnosis Date  . Aortic stenosis    a. s/p TAVR 03/2017  . CAD (coronary artery disease)    a. s/p 3-v CABG in 07/2007  . Carotid artery disease ([place])    a. s/p left sided CEA complicated by RLN parlaysis   . Cataract   . DVT (deep venous thrombosis) ([place]) 08/2010  . 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 GERD (gastroesophageal reflux disease)   . HLD (hyperlipidemia)   . Hypertension   . Nephrolithiasis 11/16/2018  . Pulmonary embolism ([place]) 08/2010  . Skin cancer   . Sleep apnea   . Stroke [facility])     Past Surgical History:  Procedure Laterality Date  . Carotid surgery Left 03/08/2007  . CHOLECYSTECTOMY  02/16/2018  . Heart Bypass N/A 07/2007  . left shoulder surgery Left 06/03/2009  . RIGHT AND LEFT HEART CATH  07/18/2007  . right shoulder surgery  2003  . TVAR      Social History   Socioeconomic History  . Marital status: Married    Spouse name: Not on file  . Number of children: Not on file  . Years of education: Not on file  . Highest education level: Not on file  Occupational History  . Not on file  Social Needs  . Financial resource strain: Not on file  . Food insecurity    Worry: Not on file    Inability:  Not on file  . Transportation needs    Medical: Not on file    Non-medical: Not on file  Tobacco Use  . Smoking status: Former Research scientist (life sciences)  . Smokeless tobacco: Never Used  Substance and Sexual Activity  . Alcohol use: Never    Frequency: Never  . Drug use: Never  . Sexual activity: Yes    Partners: Female  Lifestyle  . Physical activity    Days per week: Not on file    Minutes per session: Not on file  . Stress: Not on file  Relationships  . Social Herbalist on phone: Not on file    Gets together: Not on file    Attends religious service: Not on file    Active member of club or organization: Not on file    Attends meetings of clubs or organizations: Not on file    Relationship status: Not on file  . Intimate partner violence    Fear of current or ex partner: Not on file    Emotionally abused: Not on file    Physically abused: Not on file    Forced sexual activity: Not on file  Other Topics Concern  . Not on file  Social History Narrative  . Not on file  Family History  Problem Relation Age of Onset  . Hypertension Mother   . Stroke Mother   . Lung cancer Father   . Non-Hodgkin's lymphoma Brother    No Known Allergies  ? Current Facility-Administered Medications  Medication Dose Route Frequency Provider Last Rate Last Dose  . 0.9 %  sodium chloride infusion   Intravenous Continuous Gladstone Lighter, MD 75 mL/hr at 11/18/18 0518    . acetaminophen (TYLENOL) tablet 650 mg  650 mg Oral Q6H PRN Gladstone Lighter, MD       Or  . acetaminophen (TYLENOL) suppository 650 mg  650 mg Rectal Q6H PRN Gladstone Lighter, MD      . amLODipine (NORVASC) tablet 5 mg  5 mg Oral Daily Gladstone Lighter, MD   5 mg at 11/17/18 1407  . Ampicillin-Sulbactam (UNASYN) 3 g in sodium chloride 0.9 % 100 mL IVPB  3 g Intravenous Q6H Dillin Lofgren, MD 200 mL/hr at 11/18/18 0150 3 g at 11/18/18 0150  . enoxaparin (LOVENOX) injection 40 mg  40 mg Subcutaneous Q24H  Gladstone Lighter, MD   40 mg at 11/17/18 2143  . metoprolol tartrate (LOPRESSOR) tablet 25 mg  25 mg Oral BID Gladstone Lighter, MD   25 mg at 11/17/18 2143  . morphine 2 MG/ML injection 2 mg  2 mg Intravenous Q4H PRN Gladstone Lighter, MD   2 mg at 11/18/18 0612  . multivitamin with minerals tablet 1 tablet  1 tablet Oral Daily Gladstone Lighter, MD      . ondansetron (ZOFRAN) tablet 4 mg  4 mg Oral Q6H PRN Gladstone Lighter, MD       Or  . ondansetron (ZOFRAN) injection 4 mg  4 mg Intravenous Q6H PRN Gladstone Lighter, MD      . pantoprazole (PROTONIX) EC tablet 40 mg  40 mg Oral Daily Gladstone Lighter, MD   40 mg at 11/17/18 1407  . rosuvastatin (CRESTOR) tablet 20 mg  20 mg Oral QPM Gladstone Lighter, MD   20 mg at 11/17/18 1908     Abtx:  Anti-infectives (From admission, onward)   Start     Dose/Rate Route Frequency Ordered Stop   11/17/18 2000  Ampicillin-Sulbactam (UNASYN) 3 g in sodium chloride 0.9 % 100 mL IVPB     3 g 200 mL/hr over 30 Minutes Intravenous Every 6 hours 11/17/18 1933     11/17/18 1400  piperacillin-tazobactam (ZOSYN) IVPB 3.375 g  Status:  Discontinued     3.375 g 12.5 mL/hr over 240 Minutes Intravenous Every 8 hours 11/17/18 1340 11/17/18 1933   11/17/18 0400  cefTRIAXone (ROCEPHIN) 2 g in sodium chloride 0.9 % 100 mL IVPB     2 g 200 mL/hr over 30 Minutes Intravenous  Once 11/17/18 0348 11/17/18 0601   11/17/18 0400  metroNIDAZOLE (FLAGYL) IVPB 500 mg     500 mg 100 mL/hr over 60 Minutes Intravenous  Once 11/17/18 0348 11/17/18 0639      REVIEW OF SYSTEMS:  Const: fever,  chills, negative weight loss Eyes: negative diplopia or visual changes, negative eye pain ENT: negative coryza, negative sore throat Resp: negative cough, hemoptysis, dyspnea Cards: negative for chest pain, palpitations, lower extremity edema GU: negative for frequency, dysuria and hematuria GI: r abdominal pain, nausea and vomiting Skin: negative for rash and  pruritus Heme: negative for easy bruising and gum/nose bleeding MS: negative for myalgias, arthralgias, back pain and muscle weakness Neurolo:negative for headaches, dizziness, vertigo, memory problems  Psych: negative for feelings of anxiety, depression  Endocrine: negative for thyroid, diabetes Allergy/Immunology- negative for any medication or food allergies ? Objective:  VITALS:  BP (!) 174/65 (BP Location: Right Arm)   Pulse 88   Temp 99 F (37.2 C) (Oral)   Resp 16   Ht 5\' 6"  (1.676 m)   Wt 102.5 kg   SpO2 90%   BMI 36.48 kg/m  PHYSICAL EXAM:  General: Alert, cooperative, no distress, appears stated age.  Head: Normocephalic, without obvious abnormality, atraumatic. Eyes: Conjunctivae clear, anicteric sclerae. Pupils are equal ENT Nares normal. No drainage or sinus tenderness. Lips, mucosa, and tongue normal. No Thrush Neck: Supple, symmetrical, no adenopathy, thyroid: non tender no carotid bruit and no JVD. Back: No CVA tenderness. Lungs: Clear to auscultation bilaterally. No Wheezing or Rhonchi. No rales. Sternal scar Heart: Regular rate and rhythm, no murmur, rub or gallop. Abdomen: Soft, non-tender,not distended. Bowel sounds normal. No masses Extremities: atraumatic, no cyanosis. No edema. No clubbing Skin: No rashes or lesions. Or bruising Lymph: Cervical, supraclavicular normal. Neurologic: Grossly non-focal Pertinent Labs Lab Results CBC    Component Value Date/Time   WBC 16.1 (H) 11/18/2018 0441   RBC 4.39 11/18/2018 0441   HGB 14.4 11/18/2018 0441   HGB 14.9 10/24/2018 1139   HCT 42.2 11/18/2018 0441   HCT 42.8 10/24/2018 1139   PLT 78 (L) 11/18/2018 0441   PLT 141 (L) 10/24/2018 1139   MCV 96.1 11/18/2018 0441   MCV 95 10/24/2018 1139   MCH 32.8 11/18/2018 0441   MCHC 34.1 11/18/2018 0441   RDW 13.9 11/18/2018 0441   RDW 13.4 10/24/2018 1139   LYMPHSABS 0.9 11/17/2018 0220   LYMPHSABS 2.4 10/24/2018 1139   MONOABS 0.9 11/17/2018 0220    EOSABS 0.0 11/17/2018 0220   EOSABS 0.2 10/24/2018 1139   BASOSABS 0.0 11/17/2018 0220   BASOSABS 0.0 10/24/2018 1139    CMP Latest Ref Rng & Units 11/18/2018 11/17/2018 10/24/2018  Glucose 70 - 99 mg/dL 127(H) 162(H) 88  BUN 8 - 23 mg/dL 21 22 15   Creatinine 0.61 - 1.24 mg/dL 0.52(L) 0.66 0.73(L)  Sodium 135 - 145 mmol/L 137 138 140  Potassium 3.5 - 5.1 mmol/L 4.1 4.0 4.5  Chloride 98 - 111 mmol/L 103 104 103  CO2 22 - 32 mmol/L 24 22 -  Calcium 8.9 - 10.3 mg/dL 8.5(L) 9.2 9.6  Total Protein 6.5 - 8.1 g/dL 6.6 7.4 6.4  Total Bilirubin 0.3 - 1.2 mg/dL 3.5(H) 8.3(H) 1.0  Alkaline Phos 38 - 126 U/L 286(H) 373(H) 164(H)  AST 15 - 41 U/L 188(H) 441(H) 33  ALT 0 - 44 U/L 296(H) 484(H) -      Microbiology: Recent Results (from the past 240 hour(s))  Blood Culture (routine x 2)     Status: None (Preliminary result)   Collection Time: 11/17/18  3:50 AM   Specimen: BLOOD  Result Value Ref Range Status   Specimen Description BLOOD LEFT ANTECUBITAL  Final   Special Requests   Final    BOTTLES DRAWN AEROBIC AND ANAEROBIC Blood Culture adequate volume   Culture  Setup Time   Final    Organism ID to follow AEROBIC BOTTLE ONLY GRAM POSITIVE COCCI CRITICAL RESULT CALLED TO, READ BACK BY AND VERIFIED WITH: [facility] HALLAJI AT 1914 ON 11/17/2018 SNG Performed at [facility], [address], [place], [place] [address]    Culture [facility] POSITIVE COCCI  Final   Report Status PENDING  Incomplete  Blood Culture (routine x 2)     Status:  None (Preliminary result)   Collection Time: 11/17/18  3:50 AM   Specimen: BLOOD  Result Value Ref Range Status   Specimen Description BLOOD BLOOD LEFT FOREARM  Final   Special Requests   Final    BOTTLES DRAWN AEROBIC AND ANAEROBIC Blood Culture adequate volume   Culture   Final    NO GROWTH < 12 HOURS Performed at [facility], [address], [place], [place] [address]    Report Status PENDING  Incomplete  SARS Coronavirus 2 [facility]  order, Performed in [facility] hospital lab) Nasopharyngeal Nasopharyngeal Swab     Status: None   Collection Time: 11/17/18  3:50 AM   Specimen: Nasopharyngeal Swab  Result Value Ref Range Status   SARS Coronavirus 2 NEGATIVE NEGATIVE Final    Comment: (NOTE) If result is NEGATIVE SARS-CoV-2 target nucleic acids are NOT DETECTED. The SARS-CoV-2 RNA is generally detectable in upper and lower  respiratory specimens during the acute phase of infection. The lowest  concentration of SARS-CoV-2 viral copies this assay can detect is 250  copies / mL. A negative result does not preclude SARS-CoV-2 infection  and should not be used as the sole basis for treatment or other  patient management decisions.  A negative result may occur with  improper specimen collection / handling, submission of specimen other  than nasopharyngeal swab, presence of viral mutation(s) within the  areas targeted by this assay, and inadequate number of viral copies  (<250 copies / mL). A negative result must be combined with clinical  observations, patient history, and epidemiological information. If result is POSITIVE SARS-CoV-2 target nucleic acids are DETECTED. The SARS-CoV-2 RNA is generally detectable in upper and lower  respiratory specimens dur ing the acute phase of infection.  Positive  results are indicative of active infection with SARS-CoV-2.  Clinical  correlation with patient history and other diagnostic information is  necessary to determine patient infection status.  Positive results do  not rule out bacterial infection or co-infection with other viruses. If result is PRESUMPTIVE POSTIVE SARS-CoV-2 nucleic acids MAY BE PRESENT.   A presumptive positive result was obtained on the submitted specimen  and confirmed on repeat testing.  While 2019 novel coronavirus  (SARS-CoV-2) nucleic acids may be present in the submitted sample  additional confirmatory testing may be necessary for epidemiological  and  / or clinical management purposes  to differentiate between  SARS-CoV-2 and other Sarbecovirus currently known to infect humans.  If clinically indicated additional testing with an alternate test  methodology [phone]) is advised. The SARS-CoV-2 RNA is generally  detectable in upper and lower respiratory sp ecimens during the acute  phase of infection. The expected result is Negative. Fact Sheet for Patients:  StrictlyIdeas.no Fact Sheet for Healthcare Providers: BankingDealers.co.za This test is not yet approved or cleared by the [place] FDA and has been authorized for detection and/or diagnosis of SARS-CoV-2 by FDA under an Emergency Use Authorization (EUA).  This EUA will remain in effect (meaning this test can be used) for the duration of the COVID-19 declaration under Section 564(b)(1) of the Act, 21 U.S.C. section 360bbb-3(b)(1), unless the authorization is terminated or revoked sooner. Performed at [facility], [address], [place], [place] [address]   Blood Culture ID Panel (Reflexed)     Status: Abnormal   Collection Time: 11/17/18  3:50 AM  Result Value Ref Range Status   Enterococcus species DETECTED (A) NOT DETECTED Final    Comment: CRITICAL RESULT CALLED TO,  READ BACK BY AND VERIFIED WITH: SHEEMA HALLAJI AT 1914 ON 11/17/2018 SNG    Vancomycin resistance NOT DETECTED NOT DETECTED Final   Listeria monocytogenes NOT DETECTED NOT DETECTED Final   Staphylococcus species NOT DETECTED NOT DETECTED Final   Staphylococcus aureus (BCID) NOT DETECTED NOT DETECTED Final   Streptococcus species NOT DETECTED NOT DETECTED Final   Streptococcus agalactiae NOT DETECTED NOT DETECTED Final   Streptococcus pneumoniae NOT DETECTED NOT DETECTED Final   Streptococcus pyogenes NOT DETECTED NOT DETECTED Final   Acinetobacter baumannii NOT DETECTED NOT DETECTED Final   Enterobacteriaceae species NOT DETECTED NOT DETECTED Final    Enterobacter cloacae complex NOT DETECTED NOT DETECTED Final   Escherichia coli NOT DETECTED NOT DETECTED Final   Klebsiella oxytoca NOT DETECTED NOT DETECTED Final   Klebsiella pneumoniae NOT DETECTED NOT DETECTED Final   Proteus species NOT DETECTED NOT DETECTED Final   Serratia marcescens NOT DETECTED NOT DETECTED Final   Haemophilus influenzae NOT DETECTED NOT DETECTED Final   Neisseria meningitidis NOT DETECTED NOT DETECTED Final   Pseudomonas aeruginosa NOT DETECTED NOT DETECTED Final   Candida albicans NOT DETECTED NOT DETECTED Final   Candida glabrata NOT DETECTED NOT DETECTED Final   Candida krusei NOT DETECTED NOT DETECTED Final   Candida parapsilosis NOT DETECTED NOT DETECTED Final   Candida tropicalis NOT DETECTED NOT DETECTED Final    Comment: Performed at [facility], [address] [place], [place] [address]    IMAGING RESULTS: Reviewed CT abdomen I have personally reviewed the films ? Impression/Recommendation ? Enterococcus bacteremia due to ascending cholangitis from a CBD stone  Zosyn changed to unasyn Repeat blood culture Because of TAVR will need TEE   Ascending cholangitis and biliary pancreatitis Sp ERCP and removal of CBD stone- feeling better  CAD- s/p CABG TAVR  HTn on norvasc and metoprolol  _________________________________________________ Discussed with patient and wife and Dr.Kalisetti Note:  This document was prepared using Dragon voice recognition software and may include unintentional dictation errors.

## 2018-11-18 NOTE — Progress Notes (Addendum)
ID  Chart reviewed  Pt will be seen this morning Full consult  to follow Enterococcus bacteremia Ascending cholangitis AVR CBD stone with biliary dilataion Abnormal LFTS, leucocytosis Will need ERCP Spoke to Pharmacist and Zosyn changed to unasyn 2 decho done

## 2018-11-18 NOTE — Transfer of Care (Signed)
Immediate Anesthesia Transfer of Care Note  Patient: Jacob Montes  Procedure(s) Performed: ENDOSCOPIC RETROGRADE CHOLANGIOPANCREATOGRAPHY (ERCP) (N/A )  Patient Location: PACU  Anesthesia Type:General  Level of Consciousness: sedated  Airway & Oxygen Therapy: Patient Spontanous Breathing and Patient connected to face mask oxygen  Post-op Assessment: Report given to RN and Post -op Vital signs reviewed and stable  Post vital signs: Reviewed and stable  Last Vitals:  Vitals Value Taken Time  BP 156/66 11/18/18 1247  Temp    Pulse 87 11/18/18 1247  Resp 19 11/18/18 1247  SpO2 98 % 11/18/18 1247  Vitals shown include unvalidated device data.  Last Pain:  Vitals:   11/18/18 1108  TempSrc: Tympanic  PainSc: 8       Patients Stated Pain Goal: 0 ([id number])  Complications: No apparent anesthesia complications

## 2018-11-18 NOTE — Progress Notes (Signed)
Case request and orders for TEE to be performed on Monday, 11/21/2018 have been placed. Patient is just now back to his room following his ERCP. In this setting, we will cover risks/benefits of this with him and his wife on rounds to allow for his anesthesia to wear off. He will be NPO at midnight on 11/21/2018.

## 2018-11-18 NOTE — Anesthesia Procedure Notes (Signed)
Procedure Name: Intubation Date/Time: 11/18/2018 12:20 PM Performed by: Nelda Marseille, CRNA Pre-anesthesia Checklist: Patient identified, Patient being monitored, Timeout performed, Emergency Drugs available and Suction available Patient Re-evaluated:Patient Re-evaluated prior to induction Oxygen Delivery Method: Circle system utilized Preoxygenation: Pre-oxygenation with 100% oxygen Induction Type: IV induction Ventilation: Mask ventilation without difficulty Laryngoscope Size: Mac, 3 and McGraph Grade View: Grade I Tube type: Oral Tube size: 7.5 mm Number of attempts: 1 Airway Equipment and Method: Stylet and Video-laryngoscopy Placement Confirmation: ETT inserted through vocal cords under direct vision,  positive ETCO2 and breath sounds checked- equal and bilateral Secured at: 25 cm Tube secured with: Tape Dental Injury: Teeth and Oropharynx as per pre-operative assessment  Difficulty Due To: Difficulty was unanticipated, Difficult Airway- due to large tongue and Difficult Airway- due to limited oral opening Comments: Video Mcgrath used for intubation

## 2018-11-18 NOTE — Op Note (Signed)
[facility] Gastroenterology Patient Name: Jacob Montes Procedure Date: 11/18/2018 11:54 AM MRN: [id number] Account #: [id number] Date of Birth: 08-15-1940 Admit Type: Inpatient Age: 78 Room: [facility] ENDO ROOM 4 Gender: Male Note Status: Finalized Procedure:            ERCP Indications:          Common bile duct stone(s) Providers:            [name] MD, MD Referring MD:         Janine Ores. Rosanna Randy, MD (Referring MD) Medicines:            General Anesthesia Complications:        No immediate complications. Procedure:            Pre-Anesthesia Assessment:                       - Prior to the procedure, a History and Physical was                        performed, and patient medications and allergies were                        reviewed. The patient's tolerance of previous                        anesthesia was also reviewed. The risks and benefits of                        the procedure and the sedation options and risks were                        discussed with the patient. All questions were                        answered, and informed consent was obtained. Prior                        Anticoagulants: The patient has taken no previous                        anticoagulant or antiplatelet agents. ASA Grade                        Assessment: III - A patient with severe systemic                        disease. After reviewing the risks and benefits, the                        patient was deemed in satisfactory condition to undergo                        the procedure.                       After obtaining informed consent, the scope was passed                        under direct vision. Throughout the procedure, the  patient's blood pressure, pulse, and oxygen saturations                        were monitored continuously. The Duodenoscope was                        introduced through the mouth, and used to inject   contrast into and used to inject contrast into the bile                        duct. The ERCP was accomplished without difficulty. The                        patient tolerated the procedure well. Findings:      A scout film of the abdomen was obtained. Surgical clips, consistent       with a previous cholecystectomy, were seen in the area of the right       upper quadrant of the abdomen. The major papilla was bulging. The bile       duct was deeply cannulated with the short-nosed traction sphincterotome.       Contrast was injected. I personally interpreted the bile duct images.       There was brisk flow of contrast through the ducts. Image quality was       excellent. Contrast extended to the entire biliary tree. The lower third       of the main bile duct contained filling defect(s) thought to be a stone.       A wire was passed into the biliary tree. A 7 mm biliary sphincterotomy       was made with a traction (standard) sphincterotome using ERBE       electrocautery. There was no post-sphincterotomy bleeding. The biliary       tree was swept with a 15 mm balloon starting at the bifurcation. One       stone was removed. No stones remained. Impression:           - The major papilla appeared to be bulging.                       - A filling defect consistent with a stone was seen on                        the cholangiogram.                       - Choledocholithiasis was found. Complete removal was                        accomplished by biliary sphincterotomy and balloon                        extraction.                       - A biliary sphincterotomy was performed.                       - The biliary tree was swept. Recommendation:       - Return patient to hospital ward for ongoing care.                       -  Clear liquid diet today. Procedure Code(s):    --- Professional ---                       [phone], Endoscopic retrograde cholangiopancreatography                        (ERCP);  with removal of calculi/debris from                        biliary/pancreatic duct(s)                       43262, Endoscopic retrograde cholangiopancreatography                        (ERCP); with sphincterotomy/papillotomy                       [phone], Endoscopic catheterization of the biliary ductal                        system, radiological supervision and interpretation Diagnosis Code(s):    --- Professional ---                       K80.50, Calculus of bile duct without cholangitis or                        cholecystitis without obstruction                       K83.8, Other specified diseases of biliary tract                       R93.2, Abnormal findings on diagnostic imaging of liver                        and biliary tract CPT copyright 2019 American Medical Association. All rights reserved. The codes documented in this report are preliminary and upon coder review may  be revised to meet current compliance requirements. [name] MD, MD 11/18/2018 [id number] PM This report has been signed electronically. Number of Addenda: 0 Note Initiated On: 11/18/2018 11:54 AM Estimated Blood Loss: Estimated blood loss: none.      [facility]

## 2018-11-19 LAB — COMPREHENSIVE METABOLIC PANEL
ALT: 235 U/L — ABNORMAL HIGH (ref 0–44)
AST: 177 U/L — ABNORMAL HIGH (ref 15–41)
Albumin: 3.2 g/dL — ABNORMAL LOW (ref 3.5–5.0)
Alkaline Phosphatase: 278 U/L — ABNORMAL HIGH (ref 38–126)
Anion gap: 6 (ref 5–15)
BUN: 24 mg/dL — ABNORMAL HIGH (ref 8–23)
CO2: 27 mmol/L (ref 22–32)
Calcium: 8.6 mg/dL — ABNORMAL LOW (ref 8.9–10.3)
Chloride: 105 mmol/L (ref 98–111)
Creatinine, Ser: 0.59 mg/dL — ABNORMAL LOW (ref 0.61–1.24)
GFR calc Af Amer: >60 mL/min (ref >60)
GFR calc non Af Amer: >60 mL/min (ref >60)
Glucose, Bld: 115 mg/dL — ABNORMAL HIGH (ref 70–99)
Potassium: 4.1 mmol/L (ref 3.5–5.1)
Sodium: 138 mmol/L (ref 135–145)
Total Bilirubin: 3.2 mg/dL — ABNORMAL HIGH (ref 0.3–1.2)
Total Protein: 6.3 g/dL — ABNORMAL LOW (ref 6.5–8.1)

## 2018-11-19 NOTE — Progress Notes (Signed)
Cephas Darby, MD [address]  [place]  [place], [place] [address]  Main: [phone]  Fax: [phone] Pager: [phone]   Subjective: No acute events overnight, afebrile, hemodynamically stable.  Patient underwent ERCP yesterday with CBD stone extraction.  He does not have good appetite.  Patient denies abdominal pain, nausea or vomiting.  He is trying to get out of bed and walk around.   Objective: Vital signs in last 24 hours: Vitals:   11/19/18 0433 11/19/18 0857 11/19/18 0859 11/19/18 1436  BP: (!) 148/58 (!) 172/70 (!) 172/70 (!) 178/73  Pulse: 82 78 78 80  Resp: 20   20  Temp: 99.1 F (37.3 C) 98.4 F (36.9 C)  98.5 F (36.9 C)  TempSrc: Oral Oral  Oral  SpO2: 95% 96%  95%  Weight:      Height:       Weight change:   Intake/Output Summary (Last 24 hours) at 11/19/2018 1620 Last data filed at 11/19/2018 1235 Gross per 24 hour  Intake 3122.17 ml  Output 1025 ml  Net 2097.17 ml     Exam: Heart:: Regular rate and rhythm, S1S2 present or without murmur or extra heart sounds Lungs: normal and clear to auscultation Abdomen: soft, nontender, normal bowel sounds   Lab Results: @LABTEST2 @ Micro Results: Recent Results (from the past 240 hour(s))  Blood Culture (routine x 2)     Status: Abnormal (Preliminary result)   Collection Time: 11/17/18  3:50 AM   Specimen: BLOOD  Result Value Ref Range Status   Specimen Description   Final    BLOOD LEFT ANTECUBITAL Performed at [facility], [address], [place], [place] [address]    Special Requests   Final    BOTTLES DRAWN AEROBIC AND ANAEROBIC Blood Culture adequate volume Performed at [facility], [address] [place], [place] [address]    Culture  Setup Time   Final    AEROBIC BOTTLE ONLY GRAM POSITIVE COCCI CRITICAL RESULT CALLED TO, READ BACK BY AND VERIFIED WITH: SHEEMA HALLAJI AT 1914 ON 11/17/2018 SNG Performed at [facility], [address]  [place], [place] [address]    Culture ENTEROCOCCUS FAECIUM (A)  Final   Report Status PENDING  Incomplete  Blood Culture (routine x 2)     Status: None (Preliminary result)   Collection Time: 11/17/18  3:50 AM   Specimen: BLOOD  Result Value Ref Range Status   Specimen Description BLOOD BLOOD LEFT FOREARM  Final   Special Requests   Final    BOTTLES DRAWN AEROBIC AND ANAEROBIC Blood Culture adequate volume   Culture   Final    NO GROWTH 2 DAYS Performed at [facility], [address] [place], [place] [address]    Report Status PENDING  Incomplete  SARS Coronavirus 2 [facility] order, Performed in [facility] hospital lab) Nasopharyngeal Nasopharyngeal Swab     Status: None   Collection Time: 11/17/18  3:50 AM   Specimen: Nasopharyngeal Swab  Result Value Ref Range Status   SARS Coronavirus 2 NEGATIVE NEGATIVE Final    Comment: (NOTE) If result is NEGATIVE SARS-CoV-2 target nucleic acids are NOT DETECTED. The SARS-CoV-2 RNA is generally detectable in upper and lower  respiratory specimens during the acute phase of infection. The lowest  concentration of SARS-CoV-2 viral copies this assay can detect is 250  copies / mL. A negative result does not preclude SARS-CoV-2 infection  and should not be used as the sole  basis for treatment or other  patient management decisions.  A negative result may occur with  improper specimen collection / handling, submission of specimen other  than nasopharyngeal swab, presence of viral mutation(s) within the  areas targeted by this assay, and inadequate number of viral copies  (<250 copies / mL). A negative result must be combined with clinical  observations, patient history, and epidemiological information. If result is POSITIVE SARS-CoV-2 target nucleic acids are DETECTED. The SARS-CoV-2 RNA is generally detectable in upper and lower  respiratory specimens dur ing the acute phase of infection.  Positive  results are indicative of active  infection with SARS-CoV-2.  Clinical  correlation with patient history and other diagnostic information is  necessary to determine patient infection status.  Positive results do  not rule out bacterial infection or co-infection with other viruses. If result is PRESUMPTIVE POSTIVE SARS-CoV-2 nucleic acids MAY BE PRESENT.   A presumptive positive result was obtained on the submitted specimen  and confirmed on repeat testing.  While 2019 novel coronavirus  (SARS-CoV-2) nucleic acids may be present in the submitted sample  additional confirmatory testing may be necessary for epidemiological  and / or clinical management purposes  to differentiate between  SARS-CoV-2 and other Sarbecovirus currently known to infect humans.  If clinically indicated additional testing with an alternate test  methodology [phone]) is advised. The SARS-CoV-2 RNA is generally  detectable in upper and lower respiratory sp ecimens during the acute  phase of infection. The expected result is Negative. Fact Sheet for Patients:  StrictlyIdeas.no Fact Sheet for Healthcare Providers: BankingDealers.co.za This test is not yet approved or cleared by the [place] FDA and has been authorized for detection and/or diagnosis of SARS-CoV-2 by FDA under an Emergency Use Authorization (EUA).  This EUA will remain in effect (meaning this test can be used) for the duration of the COVID-19 declaration under Section 564(b)(1) of the Act, 21 U.S.C. section 360bbb-3(b)(1), unless the authorization is terminated or revoked sooner. Performed at [facility], [address] [place], [place] [address]   Blood Culture ID Panel (Reflexed)     Status: Abnormal   Collection Time: 11/17/18  3:50 AM  Result Value Ref Range Status   Enterococcus species DETECTED (A) NOT DETECTED Final    Comment: CRITICAL RESULT CALLED TO, READ BACK BY AND VERIFIED WITH: SHEEMA HALLAJI AT 1914 ON  11/17/2018 SNG    Vancomycin resistance NOT DETECTED NOT DETECTED Final   Listeria monocytogenes NOT DETECTED NOT DETECTED Final   Staphylococcus species NOT DETECTED NOT DETECTED Final   Staphylococcus aureus (BCID) NOT DETECTED NOT DETECTED Final   Streptococcus species NOT DETECTED NOT DETECTED Final   Streptococcus agalactiae NOT DETECTED NOT DETECTED Final   Streptococcus pneumoniae NOT DETECTED NOT DETECTED Final   Streptococcus pyogenes NOT DETECTED NOT DETECTED Final   Acinetobacter baumannii NOT DETECTED NOT DETECTED Final   Enterobacteriaceae species NOT DETECTED NOT DETECTED Final   Enterobacter cloacae complex NOT DETECTED NOT DETECTED Final   Escherichia coli NOT DETECTED NOT DETECTED Final   Klebsiella oxytoca NOT DETECTED NOT DETECTED Final   Klebsiella pneumoniae NOT DETECTED NOT DETECTED Final   Proteus species NOT DETECTED NOT DETECTED Final   Serratia marcescens NOT DETECTED NOT DETECTED Final   Haemophilus influenzae NOT DETECTED NOT DETECTED Final   Neisseria meningitidis NOT DETECTED NOT DETECTED Final   Pseudomonas aeruginosa NOT DETECTED NOT DETECTED Final   Candida albicans NOT DETECTED NOT DETECTED Final   Candida glabrata NOT DETECTED  NOT DETECTED Final   Candida krusei NOT DETECTED NOT DETECTED Final   Candida parapsilosis NOT DETECTED NOT DETECTED Final   Candida tropicalis NOT DETECTED NOT DETECTED Final    Comment: Performed at [facility], [address] [place], [place] [address]  CULTURE, BLOOD (ROUTINE X 2) w Reflex to ID Panel     Status: None (Preliminary result)   Collection Time: 11/18/18 11:58 PM   Specimen: BLOOD  Result Value Ref Range Status   Specimen Description BLOOD RIGHT HAND  Final   Special Requests   Final    BOTTLES DRAWN AEROBIC AND ANAEROBIC Blood Culture adequate volume   Culture   Final    NO GROWTH < 12 HOURS Performed at [facility], [address] [place], [place] [address]    Report Status PENDING   Incomplete  CULTURE, BLOOD (ROUTINE X 2) w Reflex to ID Panel     Status: None (Preliminary result)   Collection Time: 11/19/18 12:20 AM   Specimen: BLOOD  Result Value Ref Range Status   Specimen Description BLOOD RIGHT HAND  Final   Special Requests   Final    BOTTLES DRAWN AEROBIC AND ANAEROBIC Blood Culture results may not be optimal due to an inadequate volume of blood received in culture bottles   Culture   Final    NO GROWTH < 12 HOURS Performed at [facility], [address] [place], [place] [address]    Report Status PENDING  Incomplete   Studies/Results: Dg C-arm 1-60 Min-no Report  Result Date: 11/18/2018 Fluoroscopy was utilized by the requesting physician.  No radiographic interpretation.   Medications:  I have reviewed the patient's current medications. Prior to Admission:  Medications Prior to Admission  Medication Sig Dispense Refill Last Dose  . amLODipine (NORVASC) 5 MG tablet Take 5 mg by mouth daily.   11/16/2018 at Unknown time  . amoxicillin (AMOXIL) 500 MG capsule Take 2,000 mg by mouth as directed. Take one hour before dental procedures   prn at prn  . aspirin 81 MG chewable tablet Chew 81 mg by mouth daily.   11/16/2018 at Unknown time  . docusate sodium (COLACE) 250 MG capsule Take 250 mg by mouth daily as needed for constipation.    prn at prn  . metoprolol tartrate (LOPRESSOR) 25 MG tablet Take 25 mg by mouth 2 (two) times daily.    11/16/2018 at 0730  . montelukast (SINGULAIR) 10 MG tablet Take 10 mg by mouth at bedtime as needed (allergies).    prn at prn  . Multiple Vitamin (MULTIVITAMIN) tablet Take 1 tablet by mouth daily.   11/16/2018 at Unknown time  . omeprazole (PRILOSEC) 20 MG capsule Take 20 mg by mouth daily.   11/16/2018 at Unknown time  . rosuvastatin (CRESTOR) 20 MG tablet Take 20 mg by mouth daily.   11/16/2018 at Unknown time   Scheduled: . amLODipine  5 mg Oral Daily  . metoprolol tartrate  25 mg Oral BID  . multivitamin with  minerals  1 tablet Oral Daily  . pantoprazole  40 mg Oral Daily  . rosuvastatin  20 mg Oral QPM   Continuous: . ampicillin-sulbactam (UNASYN) IV 3 g (11/19/18 1553)   [id number] **OR** acetaminophen, labetalol, morphine injection, ondansetron **OR** ondansetron (ZOFRAN) IV Anti-infectives (From admission, onward)   Start     Dose/Rate Route Frequency Ordered Stop   11/17/18 2000  Ampicillin-Sulbactam (UNASYN) 3 g in sodium chloride 0.9 % 100 mL IVPB  3 g 200 mL/hr over 30 Minutes Intravenous Every 6 hours 11/17/18 1933     11/17/18 1400  piperacillin-tazobactam (ZOSYN) IVPB 3.375 g  Status:  Discontinued     3.375 g 12.5 mL/hr over 240 Minutes Intravenous Every 8 hours 11/17/18 1340 11/17/18 1933   11/17/18 0400  cefTRIAXone (ROCEPHIN) 2 g in sodium chloride 0.9 % 100 mL IVPB     2 g 200 mL/hr over 30 Minutes Intravenous  Once 11/17/18 0348 11/17/18 0601   11/17/18 0400  metroNIDAZOLE (FLAGYL) IVPB 500 mg     500 mg 100 mL/hr over 60 Minutes Intravenous  Once 11/17/18 0348 11/17/18 0639     Scheduled Meds: . amLODipine  5 mg Oral Daily  . metoprolol tartrate  25 mg Oral BID  . multivitamin with minerals  1 tablet Oral Daily  . pantoprazole  40 mg Oral Daily  . rosuvastatin  20 mg Oral QPM   Continuous Infusions: . ampicillin-sulbactam (UNASYN) IV 3 g (11/19/18 1553)   PRN Meds:.acetaminophen **OR** acetaminophen, labetalol, morphine injection, ondansetron **OR** ondansetron (ZOFRAN) IV   Assessment: Active Problems:   Ascending cholangitis   Common bile duct stone  Mild rise in WBC count T bili elevated to 3.2, overall slowly improving  Plan: Ascending cholangitis, status post ERCP with CBD stone extraction: No evidence of post ERCP pancreatitis Continue Unasyn Follow-up on culture and sensitivity Monitor LFTs daily Advance diet as tolerated  Plan for transesophageal echo on Monday per cardiology  Dr. Vicente Males will cover tomorrow   LOS: 2 days   Genola Yuille 11/19/2018, 4:20 PM

## 2018-11-19 NOTE — Progress Notes (Signed)
[place] [facility] [place] NAME: Jacob Montes    MR#:  [id number]  DATE OF BIRTH:  [birth year]  SUBJECTIVE:  CHIEF COMPLAINT:   Chief Complaint  Patient presents with  . Abdominal Pain   -Status post ERCP and stone removal yesterday.  Feels much better, no abdominal pain or nausea vomiting -Complains of headache  REVIEW OF SYSTEMS:  Review of Systems  Constitutional: Negative for chills, fever and malaise/fatigue.  HENT: Negative for congestion, ear discharge, hearing loss and nosebleeds.   Eyes: Negative for blurred vision and double vision.  Respiratory: Negative for cough, shortness of breath and wheezing.   Cardiovascular: Negative for chest pain and palpitations.  Gastrointestinal: Negative for abdominal pain, constipation, diarrhea, nausea and vomiting.  Genitourinary: Negative for dysuria.  Musculoskeletal: Negative for myalgias.  Neurological: Positive for headaches. Negative for dizziness, focal weakness, seizures and weakness.  Psychiatric/Behavioral: Negative for depression.    DRUG ALLERGIES:  No Known Allergies  VITALS:  Blood pressure (!) 172/70, pulse 78, temperature 98.4 F (36.9 C), temperature source Oral, resp. rate 20, height 5\' 6"  (1.676 m), weight 102.5 kg, SpO2 96 %.  PHYSICAL EXAMINATION:  Physical Exam  GENERAL:  78 y.o.-year-old patient lying in the bed with no acute distress.  EYES: Pupils equal, round, reactive to light and accommodation. No scleral icterus. Extraocular muscles intact.  HEENT: Head atraumatic, normocephalic. Oropharynx and nasopharynx clear.  NECK:  Supple, no jugular venous distention. No thyroid enlargement, no tenderness.  LUNGS: Normal breath sounds bilaterally, no wheezing, rales,rhonchi or crepitation. No use of accessory muscles of respiration.  Decreased bibasilar breath sounds CARDIOVASCULAR: S1, S2 normal. No rubs, or gallops.  Loud 3/6 systolic murmur is present at the base of the  heart ABDOMEN: Soft, nontender, nondistended. Bowel sounds present. No organomegaly or mass.  EXTREMITIES: No pedal edema, cyanosis, or clubbing.  NEUROLOGIC: Cranial nerves II through XII are intact. Muscle strength 5/5 in all extremities. Sensation intact. Gait not checked.  PSYCHIATRIC: The patient is alert and oriented x 3.  SKIN: No obvious rash, lesion, or ulcer.    LABORATORY PANEL:   CBC Recent Labs  Lab 11/18/18 0441  WBC 16.1*  HGB 14.4  HCT 42.2  PLT 78*   ------------------------------------------------------------------------------------------------------------------  Chemistries  Recent Labs  Lab 11/19/18 0422  NA 138  K 4.1  CL 105  CO2 27  GLUCOSE 115*  BUN 24*  CREATININE 0.59*  CALCIUM 8.6*  AST 177*  ALT 235*  ALKPHOS 278*  BILITOT 3.2*   ------------------------------------------------------------------------------------------------------------------  Cardiac Enzymes No results for input(s): TROPONINI in the last 168 hours. ------------------------------------------------------------------------------------------------------------------  RADIOLOGY:  Dg C-arm 1-60 Min-no Report  Result Date: 11/18/2018 Fluoroscopy was utilized by the requesting physician.  No radiographic interpretation.    EKG:   Orders placed or performed during the hospital encounter of 11/17/18  . EKG 12-Lead  . EKG 12-Lead  . ED EKG 12-Lead  . ED EKG 12-Lead  . EKG    ASSESSMENT AND PLAN:   Jacob Montes  is a 78 y.o. 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  1.  Sepsis-secondary to acute cholangitis and enterococcus bacteremia. -Elevated LFTs secondary to the same.  GI has been consulted.  CT  abdomen  showing 8 mm stone in CBD with CBD dilatation. -Status post ERCP and stone extraction done. -Continue Unasyn for now. -Advance to full liquid diet today.  LFTs are still elevated, slowly improving.  Follow-up in a.m.  2.    Enterococcus bacteremia-likely source intra-abdominal -Given his TAVR, he has a prosthetic aortic valve implantation.  Transthoracic echo negative for any vegetations. -Will need transesophageal echocardiogram.  scheduled for Monday -ID consult.  Continue antibiotics  3.  Elevated troponins-possible demand ischemia.  Appreciate cardiology consult -TTE with LVH but preserved EF.  No vegetations noted.  No further inpatient cardiac work-up.  4.  CAD status post CABG-no chest pain or shortness of breath.   - patient is status post TAVR -Continue cardiac medications.  Appreciate cardiology consult.  5.  Hypertension-on Norvasc, metoprolol  6.  DVT prophylaxis-teds and SCDs only due to thrombocytopenia  7.  Acute thrombocytopenia-secondary to sepsis.  Avoid blood thinners.  Recheck in a.m.   Patient is independent at baseline. Encourage ambulation   All the records are reviewed and case discussed with Care Management/Social Workerr. Management plans discussed with the patient, family and they are in agreement.  CODE STATUS:  Full Code  TOTAL TIME TAKING CARE OF THIS PATIENT: 38 minutes.   POSSIBLE D/C IN 3-4 DAYS, DEPENDING ON CLINICAL CONDITION.   Gladstone Lighter M.D on 11/19/2018 at 10:12 AM  Between 7am to 6pm - Pager - [phone]  After 6pm go to [email or web address] - password [email or web address] [facility]  Office  [phone]  CC: Primary care physician; Jerrol Banana., MD

## 2018-11-20 LAB — COMPREHENSIVE METABOLIC PANEL
ALT: 189 U/L — ABNORMAL HIGH (ref 0–44)
AST: 104 U/L — ABNORMAL HIGH (ref 15–41)
Albumin: 3.3 g/dL — ABNORMAL LOW (ref 3.5–5.0)
Alkaline Phosphatase: 274 U/L — ABNORMAL HIGH (ref 38–126)
Anion gap: 11 (ref 5–15)
BUN: 16 mg/dL (ref 8–23)
CO2: 23 mmol/L (ref 22–32)
Calcium: 8.6 mg/dL — ABNORMAL LOW (ref 8.9–10.3)
Chloride: 103 mmol/L (ref 98–111)
Creatinine, Ser: 0.56 mg/dL — ABNORMAL LOW (ref 0.61–1.24)
GFR calc Af Amer: >60 mL/min (ref >60)
GFR calc non Af Amer: >60 mL/min (ref >60)
Glucose, Bld: 108 mg/dL — ABNORMAL HIGH (ref 70–99)
Potassium: 3.5 mmol/L (ref 3.5–5.1)
Sodium: 137 mmol/L (ref 135–145)
Total Bilirubin: 2.3 mg/dL — ABNORMAL HIGH (ref 0.3–1.2)
Total Protein: 6.3 g/dL — ABNORMAL LOW (ref 6.5–8.1)

## 2018-11-20 LAB — CBC
HCT: 38.9 % — ABNORMAL LOW (ref 39.0–52.0)
Hemoglobin: 13.5 g/dL (ref 13.0–17.0)
MCH: 32.8 pg (ref 26.0–34.0)
MCHC: 34.7 g/dL (ref 30.0–36.0)
MCV: 94.4 fL (ref 80.0–100.0)
Platelets: 120 10*3/uL — ABNORMAL LOW (ref 150–400)
RBC: 4.12 MIL/uL — ABNORMAL LOW (ref 4.22–5.81)
RDW: 13.9 % (ref 11.5–15.5)
WBC: 10.1 10*3/uL (ref 4.0–10.5)
nRBC: 0 % (ref 0.0–0.2)

## 2018-11-20 LAB — CULTURE, BLOOD (ROUTINE X 2): Special Requests: ADEQUATE

## 2018-11-20 LAB — LIPASE, BLOOD: Lipase: 20 U/L (ref 11–51)

## 2018-11-20 MED ORDER — SODIUM CHLORIDE 0.9 % IV SOLN
INTRAVENOUS | Status: DC | PRN
  Administered 2018-11-20: 500 mL via INTRAVENOUS
  Administered 2018-11-21: 1000 mL via INTRAVENOUS
  Administered 2018-11-21: 30 mL via INTRAVENOUS
  Administered 2018-11-21: 500 mL via INTRAVENOUS

## 2018-11-20 NOTE — Progress Notes (Signed)
Jacob Montes , MD [address] [place], Jacob Montes, [place], [id number], [place], [place], [place], [address] Phone: [phone]  Fax: [phone]   Jacob Montes is being followed for common bile duct stone status post ERCP day    Subjective: Feeling well, no abdominal pain, no complaints,   Objective: Vital signs in last 24 hours: Vitals:   11/19/18 0859 11/19/18 1436 11/19/18 2148 11/20/18 0418  BP: (!) 172/70 (!) 178/73 (!) 179/64 (!) 156/58  Pulse: 78 80 79 70  Resp:  20 20 20   Temp:  98.5 F (36.9 C) 98 F (36.7 C) 97.9 F (36.6 C)  TempSrc:  Oral Oral Oral  SpO2:  95% 95% 97%  Weight:      Height:       Weight change:   Intake/Output Summary (Last 24 hours) at 11/20/2018 0846 Last data filed at 11/20/2018 [id number] Gross per 24 hour  Intake 1469.17 ml  Output 325 ml  Net 1144.17 ml     Exam: Heart:: Regular rate and rhythm, S1S2 present or without murmur or extra heart sounds Lungs: normal, clear to auscultation and clear to auscultation and percussion Abdomen: soft, nontender, normal bowel sounds   Lab Results: @LABTEST2 @ Micro Results: Recent Results (from the past 240 hour(s))  Blood Culture (routine x 2)     Status: Abnormal   Collection Time: 11/17/18  3:50 AM   Specimen: BLOOD  Result Value Ref Range Status   Specimen Description   Final    BLOOD LEFT ANTECUBITAL Performed at [facility], [address], [place], [place] [address]    Special Requests   Final    BOTTLES DRAWN AEROBIC AND ANAEROBIC Blood Culture adequate volume Performed at [facility], [address] [place], [place] [address]    Culture  Setup Time   Final    AEROBIC BOTTLE ONLY GRAM POSITIVE COCCI CRITICAL RESULT CALLED TO, READ BACK BY AND VERIFIED WITH: SHEEMA HALLAJI AT [place] ON 11/17/2018 SNG Performed at [facility], [address], [place], [place] [address]    Culture ENTEROCOCCUS FAECIUM (A)  Final   Report Status  11/20/2018 FINAL  Final   Organism ID, Bacteria ENTEROCOCCUS FAECIUM  Final      Susceptibility   Enterococcus faecium - MIC*    AMPICILLIN <=2 SENSITIVE Sensitive     VANCOMYCIN <=0.5 SENSITIVE Sensitive     GENTAMICIN SYNERGY SENSITIVE Sensitive     * ENTEROCOCCUS FAECIUM  Blood Culture (routine x 2)     Status: None (Preliminary result)   Collection Time: 11/17/18  3:50 AM   Specimen: BLOOD  Result Value Ref Range Status   Specimen Description BLOOD BLOOD LEFT FOREARM  Final   Special Requests   Final    BOTTLES DRAWN AEROBIC AND ANAEROBIC Blood Culture adequate volume   Culture   Final    NO GROWTH 3 DAYS Performed at [facility], [address], [place], [place] [address]    Report Status PENDING  Incomplete  SARS Coronavirus 2 [facility] order, Performed in [facility] hospital lab) Nasopharyngeal Nasopharyngeal Swab     Status: None   Collection Time: 11/17/18  3:50 AM   Specimen: Nasopharyngeal Swab  Result Value Ref Range Status   SARS Coronavirus 2 NEGATIVE NEGATIVE Final    Comment: (NOTE) If result is NEGATIVE SARS-CoV-2 target nucleic acids are NOT DETECTED. The SARS-CoV-2 RNA is generally detectable in upper and lower  respiratory specimens during the acute  phase of infection. The lowest  concentration of SARS-CoV-2 viral copies this assay can detect is 250  copies / mL. A negative result does not preclude SARS-CoV-2 infection  and should not be used as the sole basis for treatment or other  patient management decisions.  A negative result may occur with  improper specimen collection / handling, submission of specimen other  than nasopharyngeal swab, presence of viral mutation(s) within the  areas targeted by this assay, and inadequate number of viral copies  (<250 copies / mL). A negative result must be combined with clinical  observations, patient history, and epidemiological information. If result is POSITIVE SARS-CoV-2 target nucleic acids are  DETECTED. The SARS-CoV-2 RNA is generally detectable in upper and lower  respiratory specimens dur ing the acute phase of infection.  Positive  results are indicative of active infection with SARS-CoV-2.  Clinical  correlation with patient history and other diagnostic information is  necessary to determine patient infection status.  Positive results do  not rule out bacterial infection or co-infection with other viruses. If result is PRESUMPTIVE POSTIVE SARS-CoV-2 nucleic acids MAY BE PRESENT.   A presumptive positive result was obtained on the submitted specimen  and confirmed on repeat testing.  While 2019 novel coronavirus  (SARS-CoV-2) nucleic acids may be present in the submitted sample  additional confirmatory testing may be necessary for epidemiological  and / or clinical management purposes  to differentiate between  SARS-CoV-2 and other Sarbecovirus currently known to infect humans.  If clinically indicated additional testing with an alternate test  methodology [phone]) is advised. The SARS-CoV-2 RNA is generally  detectable in upper and lower respiratory sp ecimens during the acute  phase of infection. The expected result is Negative. Fact Sheet for Patients:  StrictlyIdeas.no Fact Sheet for Healthcare Providers: BankingDealers.co.za This test is not yet approved or cleared by the [place] FDA and has been authorized for detection and/or diagnosis of SARS-CoV-2 by FDA under an Emergency Use Authorization (EUA).  This EUA will remain in effect (meaning this test can be used) for the duration of the COVID-19 declaration under Section 564(b)(1) of the Act, 21 U.S.C. section 360bbb-3(b)(1), unless the authorization is terminated or revoked sooner. Performed at [facility], [address] [place], [place] [address]   Blood Culture ID Panel (Reflexed)     Status: Abnormal   Collection Time: 11/17/18  3:50 AM   Result Value Ref Range Status   Enterococcus species DETECTED (A) NOT DETECTED Final    Comment: CRITICAL RESULT CALLED TO, READ BACK BY AND VERIFIED WITH: SHEEMA HALLAJI AT 1914 ON 11/17/2018 SNG    Vancomycin resistance NOT DETECTED NOT DETECTED Final   Listeria monocytogenes NOT DETECTED NOT DETECTED Final   Staphylococcus species NOT DETECTED NOT DETECTED Final   Staphylococcus aureus (BCID) NOT DETECTED NOT DETECTED Final   Streptococcus species NOT DETECTED NOT DETECTED Final   Streptococcus agalactiae NOT DETECTED NOT DETECTED Final   Streptococcus pneumoniae NOT DETECTED NOT DETECTED Final   Streptococcus pyogenes NOT DETECTED NOT DETECTED Final   Acinetobacter baumannii NOT DETECTED NOT DETECTED Final   Enterobacteriaceae species NOT DETECTED NOT DETECTED Final   Enterobacter cloacae complex NOT DETECTED NOT DETECTED Final   Escherichia coli NOT DETECTED NOT DETECTED Final   Klebsiella oxytoca NOT DETECTED NOT DETECTED Final   Klebsiella pneumoniae NOT DETECTED NOT DETECTED Final   Proteus species NOT DETECTED NOT DETECTED Final   Serratia marcescens NOT DETECTED NOT DETECTED Final   Haemophilus influenzae  NOT DETECTED NOT DETECTED Final   Neisseria meningitidis NOT DETECTED NOT DETECTED Final   Pseudomonas aeruginosa NOT DETECTED NOT DETECTED Final   Candida albicans NOT DETECTED NOT DETECTED Final   Candida glabrata NOT DETECTED NOT DETECTED Final   Candida krusei NOT DETECTED NOT DETECTED Final   Candida parapsilosis NOT DETECTED NOT DETECTED Final   Candida tropicalis NOT DETECTED NOT DETECTED Final    Comment: Performed at [facility], [address] [place], [place] [address]  CULTURE, BLOOD (ROUTINE X 2) w Reflex to ID Panel     Status: None (Preliminary result)   Collection Time: 11/18/18 11:58 PM   Specimen: BLOOD  Result Value Ref Range Status   Specimen Description BLOOD RIGHT HAND  Final   Special Requests   Final    BOTTLES DRAWN AEROBIC AND  ANAEROBIC Blood Culture adequate volume   Culture   Final    NO GROWTH 1 DAY Performed at [facility], [address] [place], [place] [address]    Report Status PENDING  Incomplete  CULTURE, BLOOD (ROUTINE X 2) w Reflex to ID Panel     Status: None (Preliminary result)   Collection Time: 11/19/18 12:20 AM   Specimen: BLOOD  Result Value Ref Range Status   Specimen Description BLOOD RIGHT HAND  Final   Special Requests   Final    BOTTLES DRAWN AEROBIC AND ANAEROBIC Blood Culture results may not be optimal due to an inadequate volume of blood received in culture bottles   Culture   Final    NO GROWTH 1 DAY Performed at [facility], [address] [place], [place] [address]    Report Status PENDING  Incomplete   Studies/Results: Dg C-arm 1-60 Min-no Report  Result Date: 11/18/2018 Fluoroscopy was utilized by the requesting physician.  No radiographic interpretation.   Medications: I have reviewed the patient's current medications. Scheduled Meds: . amLODipine  5 mg Oral Daily  . metoprolol tartrate  25 mg Oral BID  . multivitamin with minerals  1 tablet Oral Daily  . pantoprazole  40 mg Oral Daily  . rosuvastatin  20 mg Oral QPM   Continuous Infusions: . ampicillin-sulbactam (UNASYN) IV 3 g (11/20/18 0317)   PRN Meds:.acetaminophen **OR** acetaminophen, labetalol, morphine injection, ondansetron **OR** ondansetron (ZOFRAN) IV   Assessment: Active Problems:   Ascending cholangitis   Common bile duct stone  Burford Wheatley 78 y.o. male who underwent ERCP on 11/18/2018 for a common bile duct stone leading to acute cholangitis..  Total bilirubin has dropped down to 2.3 from 3.2 a day back.  It was 8.3 about 3 days back.  AST and ALT are also lower.  Hemoglobin is 13.5 g.  Afebrile and doing well.  Plan: 1.  Advance diet as tolerated. 2.  I do note that he is undergoing a esophageal echocardiogram to rule out vegetations due to a positive blood culture.   Antibiotics per ID that a consult has been requested.   I will sign off.  Please call me if any further GI concerns or questions.  We would like to thank you for the opportunity to participate in the care of Jacob Montes.   LOS: 3 days   Jacob Bellows, MD 11/20/2018, 8:46 AM

## 2018-11-20 NOTE — Progress Notes (Signed)
[facility] [place] NAME: Benzion Exley    MR#:  [id number]  DATE OF BIRTH:  09/21/1940  SUBJECTIVE:  CHIEF COMPLAINT:   Chief Complaint  Patient presents with  . Abdominal Pain    -Doing well this morning, LFTs are slowly improving -No nausea or vomiting.  Still has poor appetite.  For TEE tomorrow  REVIEW OF SYSTEMS:  Review of Systems  Constitutional: Negative for chills, fever and malaise/fatigue.  HENT: Negative for congestion, ear discharge, hearing loss and nosebleeds.   Eyes: Negative for blurred vision and double vision.  Respiratory: Negative for cough, shortness of breath and wheezing.   Cardiovascular: Negative for chest pain and palpitations.  Gastrointestinal: Negative for abdominal pain, constipation, diarrhea, nausea and vomiting.  Genitourinary: Negative for dysuria.  Musculoskeletal: Negative for myalgias.  Neurological: Positive for headaches. Negative for dizziness, focal weakness, seizures and weakness.  Psychiatric/Behavioral: Negative for depression.    DRUG ALLERGIES:  No Known Allergies  VITALS:  Blood pressure (!) 146/53, pulse 70, temperature 97.9 F (36.6 C), temperature source Oral, resp. rate 20, height 5\' 6"  (1.676 m), weight 102.5 kg, SpO2 94 %.  PHYSICAL EXAMINATION:  Physical Exam  GENERAL:  78 y.o.-year-old patient lying in the bed with no acute distress.  EYES: Pupils equal, round, reactive to light and accommodation. No scleral icterus. Extraocular muscles intact.  HEENT: Head atraumatic, normocephalic. Oropharynx and nasopharynx clear.  NECK:  Supple, no jugular venous distention. No thyroid enlargement, no tenderness.  LUNGS: Normal breath sounds bilaterally, no wheezing, rales,rhonchi or crepitation. No use of accessory muscles of respiration.  Decreased bibasilar breath sounds CARDIOVASCULAR: S1, S2 normal. No rubs, or gallops.  Loud 3/6 systolic murmur is present at the base of the heart  ABDOMEN: Soft, nontender, nondistended. Bowel sounds present. No organomegaly or mass.  EXTREMITIES: No pedal edema, cyanosis, or clubbing.  NEUROLOGIC: Cranial nerves II through XII are intact. Muscle strength 5/5 in all extremities. Sensation intact. Gait not checked.  PSYCHIATRIC: The patient is alert and oriented x 3.  SKIN: No obvious rash, lesion, or ulcer.    LABORATORY PANEL:   CBC Recent Labs  Lab 11/20/18 0446  WBC 10.1  HGB 13.5  HCT 38.9*  PLT 120*   ------------------------------------------------------------------------------------------------------------------  Chemistries  Recent Labs  Lab 11/20/18 0446  NA 137  K 3.5  CL 103  CO2 23  GLUCOSE 108*  BUN 16  CREATININE 0.56*  CALCIUM 8.6*  AST 104*  ALT 189*  ALKPHOS 274*  BILITOT 2.3*   ------------------------------------------------------------------------------------------------------------------  Cardiac Enzymes No results for input(s): TROPONINI in the last 168 hours. ------------------------------------------------------------------------------------------------------------------  RADIOLOGY:  Dg C-arm 1-60 Min-no Report  Result Date: 11/18/2018 Fluoroscopy was utilized by the requesting physician.  No radiographic interpretation.    EKG:   Orders placed or performed during the hospital encounter of 11/17/18  . EKG 12-Lead  . EKG 12-Lead  . ED EKG 12-Lead  . ED EKG 12-Lead  . EKG    ASSESSMENT AND PLAN:   Peniel Andelin  is a 78 y.o. 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  1.  Sepsis-secondary to acute cholangitis and enterococcus bacteremia. -Elevated LFTs secondary to the same.  CT abdomen showing 8 mm stone  in CBD  with CBD dilatation. -Appreciate GI consult. -Status post ERCP and stone extraction done. -Continue Unasyn for now. -Diet advanced to soft diet today.  LFTs are slowly improving.    2.    Enterococcus bacteremia-likely source intra-abdominal -Given his TAVR, he has a prosthetic aortic valve implantation.  Transthoracic echo negative for any vegetations. -Will need transesophageal echocardiogram.  scheduled for Monday -ID consult.  Continue antibiotics  3.  Elevated troponins-possible demand ischemia.  Appreciate cardiology consult -TTE with LVH but preserved EF.  No vegetations noted.  No further inpatient cardiac work-up.  4.  CAD status post CABG-no chest pain or shortness of breath.   - patient is status post TAVR -Continue cardiac medications.  Appreciate cardiology consult.  5.  Hypertension-on Norvasc, metoprolol  6.  DVT prophylaxis-teds and SCDs only due to thrombocytopenia  7.  Acute thrombocytopenia-secondary to sepsis. -Improving at this time.  Continue to monitor, avoid anticoagulation.   Patient is independent at baseline. Encourage ambulation   All the records are reviewed and case discussed with Care Management/Social Workerr. Management plans discussed with the patient, family and they are in agreement.  CODE STATUS:  Full Code  TOTAL TIME TAKING CARE OF THIS PATIENT: 38 minutes.   POSSIBLE D/C IN 3-4 DAYS, DEPENDING ON CLINICAL CONDITION.   Gladstone Lighter M.D on 11/20/2018 at 10:09 AM  Between 7am to 6pm - Pager - [phone]  After 6pm go to [email or web address] - password [email or web address] [facility]  Office  [phone]  CC: Primary care physician; Jerrol Banana., MD

## 2018-11-21 ENCOUNTER — Inpatient Hospital Stay: Payer: Self-pay

## 2018-11-21 ENCOUNTER — Encounter: Payer: Self-pay | Admitting: Gastroenterology

## 2018-11-21 ENCOUNTER — Inpatient Hospital Stay (HOSPITAL_COMMUNITY)
Admission: EM | Admit: 2018-11-21 | Discharge: 2018-11-21 | Disposition: A | Discharge status: Home / Self Care | Payer: Medicare Other | Source: Home / Self Care | Attending: Physician Assistant | Admitting: Physician Assistant

## 2018-11-21 ENCOUNTER — Encounter
Admission: EM | Disposition: A | Discharge status: Home Health | Payer: Self-pay | Source: Home / Self Care | Attending: Internal Medicine

## 2018-11-21 DIAGNOSIS — Z95828 Presence of other vascular implants and grafts: Secondary | ICD-10-CM

## 2018-11-21 DIAGNOSIS — I340 Nonrheumatic mitral (valve) insufficiency: Secondary | ICD-10-CM

## 2018-11-21 DIAGNOSIS — R7881 Bacteremia: Secondary | ICD-10-CM

## 2018-11-21 DIAGNOSIS — R011 Cardiac murmur, unspecified: Secondary | ICD-10-CM

## 2018-11-21 HISTORY — PX: TEE WITHOUT CARDIOVERSION: SHX5443

## 2018-11-21 LAB — COMPREHENSIVE METABOLIC PANEL
ALT: 161 U/L — ABNORMAL HIGH (ref 0–44)
AST: 94 U/L — ABNORMAL HIGH (ref 15–41)
Albumin: 3 g/dL — ABNORMAL LOW (ref 3.5–5.0)
Alkaline Phosphatase: 216 U/L — ABNORMAL HIGH (ref 38–126)
Anion gap: 7 (ref 5–15)
BUN: 14 mg/dL (ref 8–23)
CO2: 25 mmol/L (ref 22–32)
Calcium: 8.5 mg/dL — ABNORMAL LOW (ref 8.9–10.3)
Chloride: 103 mmol/L (ref 98–111)
Creatinine, Ser: 0.51 mg/dL — ABNORMAL LOW (ref 0.61–1.24)
GFR calc Af Amer: >60 mL/min (ref >60)
GFR calc non Af Amer: >60 mL/min (ref >60)
Glucose, Bld: 93 mg/dL (ref 70–99)
Potassium: 3.4 mmol/L — ABNORMAL LOW (ref 3.5–5.1)
Sodium: 135 mmol/L (ref 135–145)
Total Bilirubin: 2.2 mg/dL — ABNORMAL HIGH (ref 0.3–1.2)
Total Protein: 5.8 g/dL — ABNORMAL LOW (ref 6.5–8.1)

## 2018-11-21 SURGERY — ECHOCARDIOGRAM, TRANSESOPHAGEAL
Anesthesia: Moderate Sedation

## 2018-11-21 MED ORDER — SODIUM CHLORIDE FLUSH 0.9 % IV SOLN
INTRAVENOUS | Status: AC
  Filled 2018-11-21: qty 10

## 2018-11-21 MED ORDER — MIDAZOLAM HCL 5 MG/5ML IJ SOLN
INTRAMUSCULAR | Status: AC | PRN
  Administered 2018-11-21: 2 mg via INTRAVENOUS

## 2018-11-21 MED ORDER — SODIUM CHLORIDE 0.9 % IV SOLN: INTRAVENOUS | Status: DC

## 2018-11-21 MED ORDER — AMPICILLIN-SULBACTAM IV (FOR PTA / DISCHARGE USE ONLY): 3.0000 g | Freq: Four times a day (QID) | INTRAVENOUS | 0 refills | Status: AC

## 2018-11-21 MED ORDER — BUTAMBEN-TETRACAINE-BENZOCAINE 2-2-14 % EX AERO
INHALATION_SPRAY | CUTANEOUS | Status: AC
  Administered 2018-11-21: 2
  Filled 2018-11-21: qty 5

## 2018-11-21 MED ORDER — LIDOCAINE VISCOUS HCL 2 % MT SOLN
OROMUCOSAL | Status: AC
  Administered 2018-11-21: 15 mL
  Filled 2018-11-21: qty 15

## 2018-11-21 MED ORDER — FENTANYL CITRATE (PF) 100 MCG/2ML IJ SOLN
INTRAMUSCULAR | Status: AC
  Filled 2018-11-21: qty 2

## 2018-11-21 MED ORDER — MIDAZOLAM HCL 5 MG/5ML IJ SOLN
INTRAMUSCULAR | Status: AC
  Filled 2018-11-21: qty 5

## 2018-11-21 MED ORDER — FENTANYL CITRATE (PF) 100 MCG/2ML IJ SOLN
INTRAMUSCULAR | Status: AC | PRN
  Administered 2018-11-21: 25 ug via INTRAVENOUS

## 2018-11-21 NOTE — Progress Notes (Signed)
PHARMACY CONSULT NOTE FOR:  OUTPATIENT  PARENTERAL ANTIBIOTIC THERAPY (OPAT)  Indication: E. Faecium bacteremia and ascending cholangitis Regimen: ampicillin/sulbactam 3gm IV q6h End date: 11/26/2018  IV antibiotic discharge orders are pended. To discharging provider:  please sign these orders via discharge navigator,  Select New Orders & click on the button choice - Manage This Unsigned Work.     Thank you for allowing pharmacy to be a part of this patient's care.  Doreene Eland, PharmD, BCPS.   Work Cell: [phone] 11/21/2018 10:41 AM

## 2018-11-21 NOTE — Discharge Summary (Signed)
Jacob Montes at [facility] NAME: Jacob Montes    MR#:  [id number]  DATE OF BIRTH:  September 22, 1940  DATE OF ADMISSION:  11/17/2018   ADMITTING PHYSICIAN: Jacob Lighter, MD  DATE OF DISCHARGE: 11/21/18  PRIMARY CARE PHYSICIAN: Jacob Banana., MD   ADMISSION DIAGNOSIS:   Choledocholithiasis [K80.50] Ascending cholangitis [K83.09] Pain [R52] RUQ pain [R10.11] Other acute pancreatitis, unspecified complication status [id number]  DISCHARGE DIAGNOSIS:   Active Problems:   Ascending cholangitis   Common bile duct stone   Bacteremia   SECONDARY DIAGNOSIS:   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 s/p 3-v CABG in 07/2007   Carotid artery disease (Jacob Montes)    a. 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  1. Sepsis-secondary to acute cholangitis and enterococcus bacteremia. -Elevated LFTs secondary to the same. CT abdomen showing 8 mm stone in CBD with CBD dilatation. -Appreciate GI consult. -Status post ERCP and stone extraction done. -On Unasyn for now. -Tolerating solid diet now.Marland Kitchen  LFTs are improving.    2.   Enterococcus bacteremia-likely source intra-abdominal/ascending cholangitis -Given  his TAVR, he has a prosthetic aortic valve implantation.  Transthoracic echo negative for any vegetations. -Appreciate cardiology consult.  TEE negative for vegetations as well. -ID consult. -Patient will need 10 days of IV antibiotics per ID for gram-positive bacteremia.   -PICC line will be placed and IV Unasyn at home until 11/26/2018  3. Elevated troponins-possible demand ischemia. Appreciate cardiology consult -TTE with LVH but preserved EF.  No vegetations noted.  No further inpatient cardiac work-up.  4. CAD status post CABG-no chest pain or shortness of breath.  - patient is status post TAVR -Continue cardiac medications. Appreciate cardiology consult.  5. Hypertension-on Norvasc, metoprolol  6.  Acute thrombocytopenia-secondary to sepsis. -Improved at this time.    Patient is independent at baseline. Updated patient and husband at bedside.  If PICC line is placed and antibiotics arranged, will be discharged home later today   DISCHARGE CONDITIONS:   Guarded  CONSULTS OBTAINED:   GI consultation Cardiology consultation ID consultation  DRUG ALLERGIES:   No Known Allergies DISCHARGE MEDICATIONS:   Allergies as of 11/21/2018   No Known Allergies     Medication List    TAKE these medications   amLODipine 5 MG tablet Commonly known as: NORVASC Take 5 mg by mouth daily.   amoxicillin 500 MG capsule Commonly known as: AMOXIL Take 2,000 mg by mouth as directed. Take one hour before dental procedures   ampicillin-sulbactam  IVPB Commonly known as: UNASYN Inject 3 g into the vein every 6 (six) hours for 5 days. Indication: E. Faecium bacteremia and ascending cholangitis Last Day of  Therapy:  11/26/2018 Labs - Once weekly:  CBC/D and CMP   aspirin 81 MG chewable tablet Chew 81 mg by mouth daily.   docusate sodium 250 MG capsule Commonly known as: COLACE Take 250 mg by mouth daily as needed for constipation.   metoprolol tartrate 25 MG  tablet Commonly known as: LOPRESSOR Take 25 mg by mouth 2 (two) times daily.   montelukast 10 MG tablet Commonly known as: SINGULAIR Take 10 mg by mouth at bedtime as needed (allergies).   multivitamin tablet Take 1 tablet by mouth daily.   omeprazole 20 MG capsule Commonly known as: PRILOSEC Take 20 mg by mouth daily.   rosuvastatin 20 MG tablet Commonly known as: CRESTOR Take 20 mg by mouth daily.            Home Infusion Instuctions  (From admission, onward)         Start     Ordered   11/21/18 0000  Home infusion instructions [facility] May follow [place] Dosing Protocol; May administer Cathflo as needed to maintain patency of vascular access device.; Flushing of vascular access device: per [facility] Protocol: 0.9% NaCl pre/post medica...    Question Answer Comment  Instructions May follow [place] Dosing Protocol   Instructions May administer Cathflo as needed to maintain patency of vascular access device.   Instructions Flushing of vascular access device: per [facility] Protocol: 0.9% NaCl pre/post medication administration and prn patency; Heparin 100 u/ml, 61ml for implanted ports and Heparin 10u/ml, 40ml for all other central venous catheters.   Instructions May follow [facility] Anaphylaxis Protocol for First Dose Administration in the home: 0.9% NaCl at 25-50 ml/hr to maintain IV access for protocol meds. Epinephrine 0.3 ml IV/IM PRN and Benadryl 25-50 IV/IM PRN s/s of anaphylaxis.   Instructions [facility] Infusion Coordinator (RN) to assist per patient IV care needs in the home PRN.      11/21/18 1041           DISCHARGE INSTRUCTIONS:   1.  PCP follow-up in 1 to 2 weeks  2.  Cardiology follow-up in 5 weeks  DIET:   Cardiac diet  ACTIVITY:   Activity as tolerated  OXYGEN:   Home Oxygen: No.  Oxygen Delivery: room air  DISCHARGE LOCATION:   home   If you experience worsening of your admission symptoms, develop shortness of breath, life  threatening emergency, suicidal or homicidal thoughts you must seek medical attention immediately by calling 911 or calling your MD immediately  if symptoms less severe.  You Must read complete instructions/literature along with all the possible adverse reactions/side effects for all the Medicines you take and that have been prescribed to you. Take any new Medicines after you have completely understood and accpet all the possible adverse reactions/side effects.   Please note  You were cared for by a hospitalist during your hospital stay. If you have any questions about your discharge medications or the care you received while you were in the hospital after you are discharged, you can call the unit and asked to speak with the hospitalist on call if the hospitalist that took care of you is not available. Once you are discharged, your primary care physician will handle any further medical issues. Please note that NO REFILLS for any discharge medications will be authorized once you are discharged, as it is imperative that you return to your primary care physician (or establish a relationship with a primary care physician if you do not  have one) for your aftercare needs so that they can reassess your need for medications and monitor your lab values.    On the day of Discharge:  VITAL SIGNS:   Blood pressure (!) 144/51, pulse (!) 59, temperature 99.1 F (37.3 C), temperature source Oral, resp. rate 20, height 5\' 6"  (1.676 m), weight 102.5 kg, SpO2 98 %.  PHYSICAL EXAMINATION:    GENERAL:78 y.o.-year-old patient lying in the bed with no acute distress.  EYES: Pupils equal, round, reactive to light and accommodation. No scleral icterus. Extraocular muscles intact.  HEENT: Head atraumatic, normocephalic. Oropharynx and nasopharynx clear.  NECK: Supple, no jugular venous distention. No thyroid enlargement, no tenderness.  LUNGS: Normal breath sounds bilaterally, no wheezing, rales,rhonchi or  crepitation. No use of accessory muscles of respiration.Decreased bibasilar breath sounds CARDIOVASCULAR: S1, S2 normal. No rubs, or gallops.Loud 3/6 systolic murmur is present at the base of the heart ABDOMEN: Soft,nontender, nondistended. Bowel sounds present. No organomegaly or mass.  EXTREMITIES: No pedal edema, cyanosis, or clubbing.  NEUROLOGIC: Cranial nerves II through XII are intact. Muscle strength 5/5 in all extremities. Sensation intact. Gait not checked.  PSYCHIATRIC: The patient is alert and oriented x 3.  SKIN: No obvious rash, lesion, or ulcer.   DATA REVIEW:   CBC Recent Labs  Lab 11/20/18 0446  WBC 10.1  HGB 13.5  HCT 38.9*  PLT 120*    Chemistries  Recent Labs  Lab 11/21/18 0558  NA 135  K 3.4*  CL 103  CO2 25  GLUCOSE 93  BUN 14  CREATININE 0.51*  CALCIUM 8.5*  AST 94*  ALT 161*  ALKPHOS 216*  BILITOT 2.2*     Microbiology Results  Results for orders placed or performed during the hospital encounter of 11/17/18  Blood Culture (routine x 2)     Status: Abnormal   Collection Time: 11/17/18  3:50 AM   Specimen: BLOOD  Result Value Ref Range Status   Specimen Description   Final    BLOOD LEFT ANTECUBITAL Performed at [facility], [address], [place], [place] [address]    Special Requests   Final    BOTTLES DRAWN AEROBIC AND ANAEROBIC Blood Culture adequate volume Performed at [facility], [address] [place], [place] [address]    Culture  Setup Time   Final    AEROBIC BOTTLE ONLY GRAM POSITIVE COCCI CRITICAL RESULT CALLED TO, READ BACK BY AND VERIFIED WITH: [place] ON 11/17/2018 SNG Performed at [facility], [place] [address], [place], [place] [address]    Culture ENTEROCOCCUS FAECIUM (A)  Final   Report Status 11/20/2018 FINAL  Final   Organism ID, Bacteria ENTEROCOCCUS FAECIUM  Final      Susceptibility   Enterococcus faecium - MIC*    AMPICILLIN <=2 SENSITIVE Sensitive      VANCOMYCIN <=0.5 SENSITIVE Sensitive     GENTAMICIN SYNERGY SENSITIVE Sensitive     * ENTEROCOCCUS FAECIUM  Blood Culture (routine x 2)     Status: None (Preliminary result)   Collection Time: 11/17/18  3:50 AM   Specimen: BLOOD  Result Value Ref Range Status   Specimen Description BLOOD BLOOD LEFT FOREARM  Final   Special Requests   Final    BOTTLES DRAWN AEROBIC AND ANAEROBIC Blood Culture adequate volume   Culture   Final    NO GROWTH 4 DAYS Performed at [facility], [address] [place],  [address]    Report Status  PENDING  Incomplete  SARS Coronavirus 2 [facility] order, Performed in [facility] hospital lab) Nasopharyngeal Nasopharyngeal Swab     Status: None   Collection Time: 11/17/18  3:50 AM   Specimen: Nasopharyngeal Swab  Result Value Ref Range Status   SARS Coronavirus 2 NEGATIVE NEGATIVE Final    Comment: (NOTE) If result is NEGATIVE SARS-CoV-2 target nucleic acids are NOT DETECTED. The SARS-CoV-2 RNA is generally detectable in upper and lower  respiratory specimens during the acute phase of infection. The lowest  concentration of SARS-CoV-2 viral copies this assay can detect is 250  copies / mL. A negative result does not preclude SARS-CoV-2 infection  and should not be used as the sole basis for treatment or other  patient management decisions.  A negative result may occur with  improper specimen collection / handling, submission of specimen other  than nasopharyngeal swab, presence of viral mutation(s) within the  areas targeted by this assay, and inadequate number of viral copies  (<250 copies / mL). A negative result must be combined with clinical  observations, patient history, and epidemiological information. If result is POSITIVE SARS-CoV-2 target nucleic acids are DETECTED. The SARS-CoV-2 RNA is generally detectable in upper and lower  respiratory specimens dur ing the acute phase of infection.  Positive  results are indicative of  active infection with SARS-CoV-2.  Clinical  correlation with patient history and other diagnostic information is  necessary to determine patient infection status.  Positive results do  not rule out bacterial infection or co-infection with other viruses. If result is PRESUMPTIVE POSTIVE SARS-CoV-2 nucleic acids MAY BE PRESENT.   A presumptive positive result was obtained on the submitted specimen  and confirmed on repeat testing.  While 2019 novel coronavirus  (SARS-CoV-2) nucleic acids may be present in the submitted sample  additional confirmatory testing may be necessary for epidemiological  and / or clinical management purposes  to differentiate between  SARS-CoV-2 and other Sarbecovirus currently known to infect humans.  If clinically indicated additional testing with an alternate test  methodology [phone]) is advised. The SARS-CoV-2 RNA is generally  detectable in upper and lower respiratory sp ecimens during the acute  phase of infection. The expected result is Negative. Fact Sheet for Patients:  StrictlyIdeas.no Fact Sheet for Healthcare Providers: BankingDealers.co.za This test is not yet approved or cleared by the [place] FDA and has been authorized for detection and/or diagnosis of SARS-CoV-2 by FDA under an Emergency Use Authorization (EUA).  This EUA will remain in effect (meaning this test can be used) for the duration of the COVID-19 declaration under Section 564(b)(1) of the Act, 21 U.S.C. section 360bbb-3(b)(1), unless the authorization is terminated or revoked sooner. Performed at [facility], [address] [place], [place] [address]   Blood Culture ID Panel (Reflexed)     Status: Abnormal   Collection Time: 11/17/18  3:50 AM  Result Value Ref Range Status   Enterococcus species DETECTED (A) NOT DETECTED Final    Comment: CRITICAL RESULT CALLED TO, READ BACK BY AND VERIFIED WITH: SHEEMA HALLAJI AT  1914 ON 11/17/2018 SNG    Vancomycin resistance NOT DETECTED NOT DETECTED Final   Listeria monocytogenes NOT DETECTED NOT DETECTED Final   Staphylococcus species NOT DETECTED NOT DETECTED Final   Staphylococcus aureus (BCID) NOT DETECTED NOT DETECTED Final   Streptococcus species NOT DETECTED NOT DETECTED Final   Streptococcus agalactiae NOT DETECTED NOT DETECTED Final   Streptococcus pneumoniae NOT DETECTED NOT DETECTED Final   Streptococcus  pyogenes NOT DETECTED NOT DETECTED Final   Acinetobacter baumannii NOT DETECTED NOT DETECTED Final   Enterobacteriaceae species NOT DETECTED NOT DETECTED Final   Enterobacter cloacae complex NOT DETECTED NOT DETECTED Final   Escherichia coli NOT DETECTED NOT DETECTED Final   Klebsiella oxytoca NOT DETECTED NOT DETECTED Final   Klebsiella pneumoniae NOT DETECTED NOT DETECTED Final   Proteus species NOT DETECTED NOT DETECTED Final   Serratia marcescens NOT DETECTED NOT DETECTED Final   Haemophilus influenzae NOT DETECTED NOT DETECTED Final   Neisseria meningitidis NOT DETECTED NOT DETECTED Final   Pseudomonas aeruginosa NOT DETECTED NOT DETECTED Final   Candida albicans NOT DETECTED NOT DETECTED Final   Candida glabrata NOT DETECTED NOT DETECTED Final   Candida krusei NOT DETECTED NOT DETECTED Final   Candida parapsilosis NOT DETECTED NOT DETECTED Final   Candida tropicalis NOT DETECTED NOT DETECTED Final    Comment: Performed at [facility], [address] [place], [place] [address]  CULTURE, BLOOD (ROUTINE X 2) w Reflex to ID Panel     Status: None (Preliminary result)   Collection Time: 11/18/18 11:58 PM   Specimen: BLOOD  Result Value Ref Range Status   Specimen Description BLOOD RIGHT HAND  Final   Special Requests   Final    BOTTLES DRAWN AEROBIC AND ANAEROBIC Blood Culture adequate volume   Culture   Final    NO GROWTH 2 DAYS Performed at [facility], [address], [place], [place] [address]    Report Status  PENDING  Incomplete  CULTURE, BLOOD (ROUTINE X 2) w Reflex to ID Panel     Status: None (Preliminary result)   Collection Time: 11/19/18 12:20 AM   Specimen: BLOOD  Result Value Ref Range Status   Specimen Description BLOOD RIGHT HAND  Final   Special Requests   Final    BOTTLES DRAWN AEROBIC AND ANAEROBIC Blood Culture results may not be optimal due to an inadequate volume of blood received in culture bottles   Culture   Final    NO GROWTH 2 DAYS Performed at [facility], [address], [place], [place] [address]    Report Status PENDING  Incomplete    RADIOLOGY:  [place] Ekg Site Rite  Result Date: 11/21/2018 If Site Rite image not attached, placement could not be confirmed due to current cardiac rhythm.    Management plans discussed with the patient, family and they are in agreement.  CODE STATUS:     Code Status Orders  (From admission, onward)         Start     Ordered   11/17/18 1353  Full code  Continuous     11/17/18 1352        Code Status History    This patient has a current code status but no historical code status.   Advance Care Planning Activity    Advance Directive Documentation     Most Recent Value  Type of Advance Directive  Living will, Healthcare Power of Attorney  Pre-existing out of facility DNR order (yellow form or pink MOST form)  --  "MOST" Form in Place?  --      TOTAL TIME TAKING CARE OF THIS PATIENT: 38 minutes.    Jacob Montes M.D on 11/21/2018 at 1:52 PM  Between 7am to 6pm - Pager - [phone]  After 6pm go to [email or web address] - [employer] [facility]  Office  [phone]  CC: Primary care physician; Rosanna Randy,  Retia Passe., MD   Note: This dictation was prepared with Dragon dictation along with smaller phrase technology. Any transcriptional errors that result from this process are unintentional.

## 2018-11-21 NOTE — CV Procedure (Addendum)
Transesophageal echocardiogram was performed without complications.  It showed normal functioning TAVR prosthesis with no evidence of vegetations or endocarditis.  Full report to follow.  During this procedure the patient is administered a total of Versed 2 mg and Fentanyl 25 mcg to achieve and maintain moderate conscious sedation.  The patient's heart rate, blood pressure, and oxygen saturation are monitored continuously during the procedure. The period of conscious sedation is 10 minutes, of which I was present face-to-face 100% of this time.

## 2018-11-21 NOTE — Care Management Important Message (Signed)
Important Message  Patient Details  Name: Jacob Montes MRN: [id number] Date of Birth: [birth year]   Medicare Important Message Given:  Yes     Dannette Barbara 11/21/2018, 11:49 AM

## 2018-11-21 NOTE — TOC Initial Note (Signed)
Transition of Care [facility]) - Initial/Assessment Note    Patient Details  Name: Jacob Montes MRN: [id number] Date of Birth: 29-Jun-1940  Transition of Care [facility]) CM/SW Contact:    Beverly Sessions, RN Phone Number: 11/21/2018, 3:23 PM  Clinical Narrative:                 Patient admitted from home with sepsis  Patient lives at home with wife.  She is at bedside PCP [facility]  Patient states that at baseline he drives, and is independent  Patient to discharge with IV antibiotics. Patient in agreement. CMS Medicare.gov Compare Post Acute Care list reviewed with patient.  Patient states he does not have a preference of home health agency.   Referral made to [facility] with [facility] for infusion Referral made to [facility] with [place] for home health RN.    Pam to provide education for infusion today at bedside  Will have midline placed.  Then will administer his own evening dose at home tonight.  [place] with [place] notified    Expected Discharge Plan: [place] Barriers to Discharge: Barriers Resolved   Patient Goals and CMS Choice   CMS Medicare.gov Compare Post Acute Care list provided to:: Patient Choice offered to / list presented to : Patient, Spouse  Expected Discharge Plan and Services Expected Discharge Plan: [place]   Discharge Planning Services: CM Consult Post Acute Care Choice: [place] arrangements for the past 2 months: Single Family Home                           HH Arranged: RN, IV Antibiotics HH Agency: [place] [facility]) Date [place]: 11/21/18   Representative spoke with at [place]: [place] Arrangements/Services Living arrangements for the past 2 months: [place] with:: Spouse Patient language and need for interpreter reviewed:: Yes Do you feel safe going back to the place where you live?: Yes       Need for Family Participation in Patient Care: Yes (Comment) Care giver support system in place?: Yes (comment)   Criminal Activity/Legal Involvement Pertinent to Current Situation/Hospitalization: No - Comment as needed  Activities of Daily Living Home Assistive Devices/Equipment: None ADL Screening (condition at time of admission) Patient's cognitive ability adequate to safely complete daily activities?: Yes Is the patient deaf or have difficulty hearing?: No Does the patient have difficulty seeing, even when wearing glasses/contacts?: No Does the patient have difficulty concentrating, remembering, or making decisions?: No Patient able to express need for assistance with ADLs?: Yes Does the patient have difficulty dressing or bathing?: No Independently performs ADLs?: Yes (appropriate for developmental age) Does the patient have difficulty walking or climbing stairs?: No Weakness of Legs: None Weakness of Arms/Hands: None  Permission Sought/Granted                  Emotional Assessment Appearance:: Appears stated age Attitude/Demeanor/Rapport: Gracious Affect (typically observed): Accepting Orientation: : Oriented to Self, Oriented to Place, Oriented to  Time, Oriented to Situation   Psych Involvement: No (comment)  Admission diagnosis:  Choledocholithiasis [K80.50] Ascending cholangitis [K83.09] Pain [R52] RUQ pain [R10.11] Other acute pancreatitis, unspecified complication status [id number] Patient Active Problem List   Diagnosis Date Noted  . Bacteremia   . Common bile duct stone   . Ascending cholangitis 11/17/2018  . Aortic stenosis 11/16/2018  .  Arteriosclerotic coronary artery disease 11/16/2018  . Benign essential hypertension 11/16/2018  . 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 11/16/2018  . Carotid stenosis, bilateral 11/16/2018  . Dyslipidemia 11/16/2018  . Dysmetabolic syndrome [id number]  . 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  Hyperglycemia 11/16/2018  . 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 Nephrolithiasis 11/16/2018  . Sleep apnea 11/16/2018  . 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 Constipation 11/16/2018   PCP:  Jerrol Banana., MD Pharmacy:   [facility] [address] [place] [address] [place] [address] Phone: [phone] Fax: [phone]     Social Determinants of Health (SDOH) Interventions    Readmission Risk Interventions No flowsheet data found.

## 2018-11-21 NOTE — Treatment Plan (Signed)
Diagnosis: Ascending cholangitis with enterococcus bacteremia Baseline Creatinine 0.59   No Known Allergies  OPAT Orders Discharge antibiotics: Unasyn 3 grams IV every 6 hours until 11/26/18   [facility] Per Protocol:  Labs weekly while on IV antibiotics: _X_ CBC with differential _X_ CMP  _X_ Please pull PIC at completion of IV antibiotics _  Fax weekly labs to  [place] [phone]   Call [phone] if any questions or concerns

## 2018-11-21 NOTE — Progress Notes (Addendum)
[facility] NAME: Agim Kingcade    MR#:  [id number]  DATE OF BIRTH:  1941-01-09  SUBJECTIVE:  CHIEF COMPLAINT:   Chief Complaint  Patient presents with  . Abdominal Pain    -LFTs are slowly improving.  Patient is doing well.  Status post TEE today which was negative for any vegetations  REVIEW OF SYSTEMS:  Review of Systems  Constitutional: Negative for chills, fever and malaise/fatigue.  HENT: Negative for congestion, ear discharge, hearing loss and nosebleeds.   Eyes: Negative for blurred vision and double vision.  Respiratory: Negative for cough, shortness of breath and wheezing.   Cardiovascular: Negative for chest pain and palpitations.  Gastrointestinal: Negative for abdominal pain, constipation, diarrhea, nausea and vomiting.  Genitourinary: Negative for dysuria.  Musculoskeletal: Negative for myalgias.  Neurological: Positive for headaches. Negative for dizziness, focal weakness, seizures and weakness.  Psychiatric/Behavioral: Negative for depression.    DRUG ALLERGIES:  No Known Allergies  VITALS:  Blood pressure (!) 154/50, pulse 63, temperature 99 F (37.2 C), temperature source Oral, resp. rate 18, height 5\' 6"  (1.676 m), weight 102.5 kg, SpO2 95 %.  PHYSICAL EXAMINATION:  Physical Exam  GENERAL:  78 y.o.-year-old patient lying in the bed with no acute distress.  EYES: Pupils equal, round, reactive to light and accommodation. No scleral icterus. Extraocular muscles intact.  HEENT: Head atraumatic, normocephalic. Oropharynx and nasopharynx clear.  NECK:  Supple, no jugular venous distention. No thyroid enlargement, no tenderness.  LUNGS: Normal breath sounds bilaterally, no wheezing, rales,rhonchi or crepitation. No use of accessory muscles of respiration.  Decreased bibasilar breath sounds CARDIOVASCULAR: S1, S2 normal. No rubs, or gallops.  Loud 3/6 systolic murmur is present at the base of the heart  ABDOMEN: Soft, nontender, nondistended. Bowel sounds present. No organomegaly or mass.  EXTREMITIES: No pedal edema, cyanosis, or clubbing.  NEUROLOGIC: Cranial nerves II through XII are intact. Muscle strength 5/5 in all extremities. Sensation intact. Gait not checked.  PSYCHIATRIC: The patient is alert and oriented x 3.  SKIN: No obvious rash, lesion, or ulcer.    LABORATORY PANEL:   CBC Recent Labs  Lab 11/20/18 0446  WBC 10.1  HGB 13.5  HCT 38.9*  PLT 120*   ------------------------------------------------------------------------------------------------------------------  Chemistries  Recent Labs  Lab 11/21/18 0558  NA 135  K 3.4*  CL 103  CO2 25  GLUCOSE 93  BUN 14  CREATININE 0.51*  CALCIUM 8.5*  AST 94*  ALT 161*  ALKPHOS 216*  BILITOT 2.2*   ------------------------------------------------------------------------------------------------------------------  Cardiac Enzymes No results for input(s): TROPONINI in the last 168 hours. ------------------------------------------------------------------------------------------------------------------  RADIOLOGY:  [place] Ekg Site Rite  Result Date: 11/21/2018 If Site Rite image not attached, placement could not be confirmed due to current cardiac rhythm.   EKG:   Orders placed or performed during the hospital encounter of 11/17/18  . EKG 12-Lead  . EKG 12-Lead  . ED EKG 12-Lead  . ED EKG 12-Lead  . EKG    ASSESSMENT AND PLAN:   Dohnovan Clarida  is a 78 y.o. 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  1.  Sepsis-secondary to acute cholangitis and enterococcus bacteremia. -Elevated LFTs secondary to the same.  CT abdomen showing 8  mm stone in CBD with  CBD dilatation. -Appreciate GI consult. -Status post ERCP and stone extraction done. -On Unasyn for now. -Tolerating solid diet now.Marland Kitchen  LFTs are slowly improving.    2.    Enterococcus bacteremia-likely source intra-abdominal/ascending cholangitis -Given his TAVR, he has a prosthetic aortic valve implantation.  Transthoracic echo negative for any vegetations. -Appreciate cardiology consult.  TEE negative for vegetations as well. -ID consult. -Patient will need 10 days of IV antibiotics per ID.  Will get a PICC line placed and IV Unasyn at home until 11/26/2018  3.  Elevated troponins-possible demand ischemia.  Appreciate cardiology consult -TTE with LVH but preserved EF.  No vegetations noted.  No further inpatient cardiac work-up.  4.  CAD status post CABG-no chest pain or shortness of breath.   - patient is status post TAVR -Continue cardiac medications.  Appreciate cardiology consult.  5.  Hypertension-on Norvasc, metoprolol  6.  DVT prophylaxis-teds and SCDs only due to thrombocytopenia  7.  Acute thrombocytopenia-secondary to sepsis. -Improved at this time.  Continue to monitor   Patient is independent at baseline. Encourage ambulation Plan for discharge home today   All the records are reviewed and case discussed with Care Management/Social Workerr. Management plans discussed with the patient, family and they are in agreement.  CODE STATUS:  Full Code  TOTAL TIME TAKING CARE OF THIS PATIENT: 38 minutes.   POSSIBLE D/C today or tomorrow, DEPENDING ON CLINICAL CONDITION.   Gladstone Lighter M.D on 11/21/2018 at 10:38 AM  Between 7am to 6pm - Pager - [phone]  After 6pm go to [email or web address] - password [email or web address] [facility]  Office  [phone]  CC: Primary care physician; Jerrol Banana., MD

## 2018-11-21 NOTE — Progress Notes (Signed)
*  PRELIMINARY RESULTS* Echocardiogram Echocardiogram Transesophageal has been performed.  Radcliffe, Nuhn 11/21/2018, 8:41 AM

## 2018-11-21 NOTE — Progress Notes (Signed)
   Date of Admission:  11/17/2018      Subjective: Doing better No pain abdomen No fever PICC line placed.  Medications:  . amLODipine  5 mg Oral Daily  . fentaNYL      . metoprolol tartrate  25 mg Oral BID  . midazolam      . multivitamin with minerals  1 tablet Oral Daily  . pantoprazole  40 mg Oral Daily  . rosuvastatin  20 mg Oral QPM  . sodium chloride flush        Objective: Vital signs in last 24 hours: Temp:  [98.5 F (36.9 C)-99.2 F (37.3 C)] 99.1 F (37.3 C) (10/12 1238) Pulse Rate:  [58-89] 59 (10/12 1238) Resp:  [15-26] 20 (10/12 1238) BP: (138-180)/(46-69) 144/51 (10/12 1238) SpO2:  [93 %-98 %] 98 % (10/12 1238)  PHYSICAL EXAM:  General: Alert, cooperative, no distress, Lungs: Clear to auscultation bilaterally. No Wheezing or Rhonchi. No rales. Heart: S1-S2.  Systolic murmur. Abdomen: Soft, non-tender,not distended. Bowel sounds normal. No masses Extremities: atraumatic, no cyanosis. No edema. No clubbing Skin: No rashes or lesions. Or bruising Lymph: Cervical, supraclavicular normal. Neurologic: Grossly non-focal  Lab Results Recent Labs    11/20/18 0446 11/21/18 0558  WBC 10.1  --   HGB 13.5  --   HCT 38.9*  --   NA 137 135  K 3.5 3.4*  CL 103 103  CO2 23 25  BUN 16 14  CREATININE 0.56* 0.51*   Liver Panel Recent Labs    11/20/18 0446 11/21/18 0558  PROT 6.3* 5.8*  ALBUMIN 3.3* 3.0*  AST 104* 94*  ALT 189* 161*  ALKPHOS 274* 216*  BILITOT 2.3* 2.2*   Sedimentation Rate No results for input(s): ESRSEDRATE in the last 72 hours. C-Reactive Protein No results for input(s): CRP in the last 72 hours.  Microbiology:  Studies/Results: [place] Ekg Site Rite  Result Date: 11/21/2018 If Site Rite image not attached, placement could not be confirmed due to current cardiac rhythm.    Assessment/Plan: Enterococcus bacteremia secondary to ascending cholangitis due to common bile duct stone which has been removed. Aortic bioprosthetic  valve has TAVR TEE no vegetation. Repeat blood cultures have been negative. He will continue on Unasyn for another week.  To complete 10 days of treatment Discussed with the patient and his wife in great detail.  Patient already has a midline and is going home today.

## 2018-11-21 NOTE — Progress Notes (Signed)
Dc instructions gone over with pt.  Meds reviewed.  Pt knows to call MD in the morning to make follow up appts.  Wife at bedside.  Home health has been to pt's room and done education on IV med administration at home.  Wife is very nervous.  She told me the steps and what she would do.  She verbalizes clear understanding of the process. VSS.  Ride is on the way.  Dc home.

## 2018-11-21 NOTE — Progress Notes (Signed)
Spoke with Eustaquio Maize, RN concerning PICC placement. Pt. To be discharged today. Every effort will be made to place PICC today.

## 2018-11-22 ENCOUNTER — Telehealth: Payer: Self-pay | Admitting: Family Medicine

## 2018-11-22 ENCOUNTER — Telehealth: Payer: Self-pay

## 2018-11-22 ENCOUNTER — Encounter: Payer: Self-pay | Admitting: Cardiovascular Disease

## 2018-11-22 LAB — CULTURE, BLOOD (ROUTINE X 2)
Culture: NO GROWTH
Special Requests: ADEQUATE

## 2018-11-22 NOTE — Telephone Encounter (Signed)
Mardene Celeste, RN @ [facility] notified.

## 2018-11-22 NOTE — Telephone Encounter (Signed)
Ok thx.

## 2018-11-22 NOTE — Telephone Encounter (Signed)
Please advise 

## 2018-11-22 NOTE — Telephone Encounter (Signed)
Transition Care Management Follow-up Telephone Call  Date of discharge and from where: [facility] on 11/21/18.  How have you been since you were released from the hospital? Doing fine, declined any pain, fever or n/v/d.  Any questions or concerns? No   Items Reviewed:  Did the pt receive and understand the discharge instructions provided? Yes   Medications obtained and verified? Yes   Any new allergies since your discharge? No   Dietary orders reviewed? Yes  Do you have support at home? Yes   Other (ie: DME, Home Health, etc): N/A  Functional Questionnaire: (I = Independent and D = Dependent)  Bathing/Dressing- I   Meal Prep- I  Eating- I  Maintaining continence- I  Transferring/Ambulation- I  Managing Meds- I   Follow up appointments reviewed:    PCP Hospital f/u appt confirmed? Yes  Scheduled to see Jacob Montes on 12/01/18 @ 11:00 AM.  [facility] f/u appt confirmed? Yes    Are transportation arrangements needed? No   If their condition worsens, is the pt aware to call  their PCP or go to the ED? Yes  Was the patient provided with contact information for the PCP's office or ED? Yes  Was the pt encouraged to call back with questions or concerns? Yes

## 2018-11-22 NOTE — Telephone Encounter (Signed)
No HFU scheduled.  

## 2018-11-22 NOTE — Telephone Encounter (Signed)
Mardene Celeste, RN w/ [facility] [phone]  Orders for:  In home nursing 3 times a week for 1 week 1 time a week for 2 weeks   IV antibiotic picc line care  Labs  Thanks, [facility]

## 2018-11-23 NOTE — Anesthesia Postprocedure Evaluation (Signed)
Anesthesia Post Note  Patient: Caycen Nodal  Procedure(s) Performed: ENDOSCOPIC RETROGRADE CHOLANGIOPANCREATOGRAPHY (ERCP) (N/A )  Patient location during evaluation: PACU Anesthesia Type: General Level of consciousness: awake and alert Pain management: pain level controlled Vital Signs Assessment: post-procedure vital signs reviewed and stable Respiratory status: spontaneous breathing, nonlabored ventilation, respiratory function stable and patient connected to nasal cannula oxygen Cardiovascular status: blood pressure returned to baseline and stable Postop Assessment: no apparent nausea or vomiting Anesthetic complications: no     Last Vitals:  Vitals:   11/21/18 1238 11/21/18 1748  BP: (!) 144/51 (!) 154/52  Pulse: (!) 59 62  Resp: 20 19  Temp: 37.3 C 36.4 C  SpO2: 98% 95%    Last Pain:  Vitals:   11/21/18 1748  TempSrc: Oral  PainSc:                  Molli Barrows

## 2018-11-24 ENCOUNTER — Other Ambulatory Visit: Payer: Self-pay | Admitting: *Deleted

## 2018-11-24 LAB — CULTURE, BLOOD (ROUTINE X 2)
Culture: NO GROWTH
Culture: NO GROWTH
Special Requests: ADEQUATE

## 2018-11-24 MED ORDER — AMLODIPINE BESYLATE 5 MG PO TABS: 5.0000 mg | ORAL_TABLET | Freq: Every day | ORAL | 3 refills | Status: DC

## 2018-11-24 NOTE — Telephone Encounter (Signed)
Pharmacy requesting refills. Thanks!  

## 2018-11-30 NOTE — Progress Notes (Signed)
Patient: Jacob Montes Male    DOB: 1940/11/20   78 y.o.   MRN: [id number] Visit Date: 12/01/2018  Today's Provider: [name], MD   Chief Complaint  Patient presents with  . Hospitalization Follow-up   Subjective:     HPI    Follow up Hospitalization  Patient was admitted to [facility] on 11/17/2018 and discharged on 11/21/2018. He was treated for Abominable Pain, Choledocholithiasis, Ascending cholangitis, RUQ pain, and Other acute pancreatitis, unspecified complication status. Treatment for this included; labs, EKG, [place], CT. Advised to follow-up with pcp in 1 to 2 weeks and Cardiology follow-up in 5 weeks. Telephone follow up was done on 11/22/2018 He reports good compliance with treatment. He reports this condition is Improved.  He states that he feels almost back to normal.   No Known Allergies   Current Outpatient Medications:  .  amLODipine (NORVASC) 5 MG tablet, Take 1 tablet (5 mg total) by mouth daily., Disp: 90 tablet, Rfl: 3 .  aspirin 81 MG chewable tablet, Chew 81 mg by mouth daily., Disp: , Rfl:  .  docusate sodium (COLACE) 250 MG capsule, Take 250 mg by mouth daily as needed for constipation. , Disp: , Rfl:  .  metoprolol tartrate (LOPRESSOR) 25 MG tablet, Take 25 mg by mouth 2 (two) times daily. , Disp: , Rfl:  .  montelukast (SINGULAIR) 10 MG tablet, Take 10 mg by mouth at bedtime as needed (allergies). , Disp: , Rfl:  .  Multiple Vitamin (MULTIVITAMIN) tablet, Take 1 tablet by mouth daily., Disp: , Rfl:  .  omeprazole (PRILOSEC) 20 MG capsule, Take 20 mg by mouth daily., Disp: , Rfl:  .  rosuvastatin (CRESTOR) 20 MG tablet, Take 20 mg by mouth daily., Disp: , Rfl:  .  amoxicillin (AMOXIL) 500 MG capsule, Take 2,000 mg by mouth as directed. Take one hour before dental procedures, Disp: , Rfl:   Review of Systems  Constitutional: Negative for appetite change, chills and fever.  Eyes: Negative.   Respiratory: Negative for chest tightness, shortness  of breath and wheezing.   Cardiovascular: Negative for chest pain and palpitations.  Gastrointestinal: Negative for abdominal pain, nausea and vomiting.  Endocrine: Negative.   Allergic/Immunologic: Negative.   Neurological: Negative.   Hematological: Negative.   Psychiatric/Behavioral: Negative.     Social History   Tobacco Use  . Smoking status: Former Research scientist (life sciences)  . Smokeless tobacco: Never Used  Substance Use Topics  . Alcohol use: Never    Frequency: Never      Objective:   BP 126/70   Pulse (!) 52   Temp 98.5 F (36.9 C)   Resp 16   Wt 230 lb (104.3 kg)   SpO2 98%   BMI 37.12 kg/m  Vitals:   12/01/18 1047  BP: 126/70  Pulse: (!) 52  Resp: 16  Temp: 98.5 F (36.9 C)  SpO2: 98%  Weight: 230 lb (104.3 kg)  Body mass index is 37.12 kg/m.   Physical Exam Vitals signs reviewed.  Constitutional:      Appearance: He is obese.  HENT:     Head: Normocephalic and atraumatic.     Right Ear: External ear normal.     Left Ear: External ear normal.  Eyes:     General: No scleral icterus.    Conjunctiva/sclera: Conjunctivae normal.  Cardiovascular:     Rate and Rhythm: Normal rate and regular rhythm.     Heart sounds: Normal heart sounds.  Pulmonary:  Breath sounds: Normal breath sounds.  Abdominal:     Palpations: Abdomen is soft.     Tenderness: There is no abdominal tenderness.  Skin:    General: Skin is warm and dry.  Neurological:     General: No focal deficit present.     Mental Status: He is alert and oriented to person, place, and time.  Psychiatric:        Mood and Affect: Mood normal.        Behavior: Behavior normal.        Thought Content: Thought content normal.        Judgment: Judgment normal.      No results found for any visits on 12/01/18.     Assessment & Plan     1. Ascending cholangitis Resolved after ERCP.  Repeat labs today.  2. Common bile duct stone Patient had ERCP  3. Bacteremia Clinically resolved.  4.  Essential hypertension  - Comprehensive metabolic panel  5. Coronary artery disease with angina pectoris, unspecified vessel or lesion type, unspecified whether native or transplanted heart [facility]) All risk factors treated - CBC with Differential/Platelet  6. Hyperlipidemia, unspecified hyperlipidemia type   7. Calculus of gallbladder with acute cholecystitis without obstruction   8. Aortic valve stenosis, etiology of cardiac valve disease unspecified      I, Rachelle L. Presley, CMA, am acting as a Education administrator for [employer]. Rosanna Randy, [place]      [name], MD  [facility]

## 2018-12-01 ENCOUNTER — Other Ambulatory Visit: Payer: Self-pay

## 2018-12-01 ENCOUNTER — Encounter: Payer: Self-pay | Admitting: Family Medicine

## 2018-12-01 ENCOUNTER — Ambulatory Visit (INDEPENDENT_AMBULATORY_CARE_PROVIDER_SITE_OTHER): Payer: Medicare Other | Admitting: Family Medicine

## 2018-12-01 VITALS — BP 126/70 | HR 52 | Temp 98.5°F | Resp 16 | Wt 230.0 lb

## 2018-12-01 DIAGNOSIS — K8000 Calculus of gallbladder with acute cholecystitis without obstruction: Secondary | ICD-10-CM

## 2018-12-01 DIAGNOSIS — I25119 Atherosclerotic heart disease of native coronary artery with unspecified angina pectoris: Secondary | ICD-10-CM

## 2018-12-01 DIAGNOSIS — I10 Essential (primary) hypertension: Secondary | ICD-10-CM

## 2018-12-01 DIAGNOSIS — E785 Hyperlipidemia, unspecified: Secondary | ICD-10-CM

## 2018-12-01 DIAGNOSIS — I350 Nonrheumatic aortic (valve) stenosis: Secondary | ICD-10-CM

## 2018-12-01 DIAGNOSIS — K8050 Calculus of bile duct without cholangitis or cholecystitis without obstruction: Secondary | ICD-10-CM

## 2018-12-01 DIAGNOSIS — K8309 Other cholangitis: Secondary | ICD-10-CM | POA: Diagnosis not present

## 2018-12-01 DIAGNOSIS — R7881 Bacteremia: Secondary | ICD-10-CM | POA: Diagnosis not present

## 2018-12-02 LAB — COMPREHENSIVE METABOLIC PANEL
ALT: 53 IU/L — ABNORMAL HIGH (ref 0–44)
AST: 42 IU/L — ABNORMAL HIGH (ref 0–40)
Albumin/Globulin Ratio: 1.9 (ref 1.2–2.2)
Albumin: 3.8 g/dL (ref 3.7–4.7)
Alkaline Phosphatase: 128 IU/L — ABNORMAL HIGH (ref 39–117)
BUN/Creatinine Ratio: 18 (ref 10–24)
BUN: 14 mg/dL (ref 8–27)
Bilirubin Total: 0.9 mg/dL (ref 0.0–1.2)
CO2: 23 mmol/L (ref 20–29)
Calcium: 9.1 mg/dL (ref 8.6–10.2)
Chloride: 106 mmol/L (ref 96–106)
Creatinine, Ser: 0.78 mg/dL (ref 0.76–1.27)
GFR calc Af Amer: 100 mL/min/{1.73_m2} (ref >59)
GFR calc non Af Amer: 86 mL/min/{1.73_m2} (ref >59)
Globulin, Total: 2 g/dL (ref 1.5–4.5)
Glucose: 77 mg/dL (ref 65–99)
Potassium: 4.8 mmol/L (ref 3.5–5.2)
Sodium: 140 mmol/L (ref 134–144)
Total Protein: 5.8 g/dL — ABNORMAL LOW (ref 6.0–8.5)

## 2018-12-02 LAB — CBC WITH DIFFERENTIAL/PLATELET
Basophils Absolute: 0.1 10*3/uL (ref 0.0–0.2)
Basos: 1 %
EOS (ABSOLUTE): 0 10*3/uL (ref 0.0–0.4)
Eos: 1 %
Hematocrit: 38.4 % (ref 37.5–51.0)
Hemoglobin: 13.3 g/dL (ref 13.0–17.7)
Immature Grans (Abs): 0 10*3/uL (ref 0.0–0.1)
Immature Granulocytes: 0 %
Lymphocytes Absolute: 2.4 10*3/uL (ref 0.7–3.1)
Lymphs: 44 %
MCH: 32.8 pg (ref 26.6–33.0)
MCHC: 34.6 g/dL (ref 31.5–35.7)
MCV: 95 fL (ref 79–97)
Monocytes Absolute: 0.7 10*3/uL (ref 0.1–0.9)
Monocytes: 13 %
Neutrophils Absolute: 2.2 10*3/uL (ref 1.4–7.0)
Neutrophils: 41 %
Platelets: 172 10*3/uL (ref 150–450)
RBC: 4.06 x10E6/uL — ABNORMAL LOW (ref 4.14–5.80)
RDW: 13 % (ref 11.6–15.4)
WBC: 5.5 10*3/uL (ref 3.4–10.8)

## 2019-01-04 ENCOUNTER — Other Ambulatory Visit: Payer: Self-pay

## 2019-01-04 DIAGNOSIS — Z20822 Contact with and (suspected) exposure to covid-19: Secondary | ICD-10-CM

## 2019-01-05 LAB — NOVEL CORONAVIRUS, NAA: SARS-CoV-2, NAA: NOT DETECTED

## 2019-02-15 NOTE — Progress Notes (Signed)
Patient: Jacob Montes, Male    DOB: Jan 14, 1941, 79 y.o.   MRN: [id number] Visit Date: 02/16/2019  Today's Provider: [name], MD   Chief Complaint  Patient presents with  . Medicare Wellness   Subjective:     Annual wellness visit Jacob Montes is a 79 y.o. male. He feels well. He reports exercising not like he should.Marland Kitchen He reports he is sleeping fairly well.  -----------------------------------------------------------  Colonoscopy:   Review of Systems  Constitutional: Positive for activity change.  HENT: Positive for hearing loss and tinnitus.   Eyes: Negative.   Respiratory: Negative.   Cardiovascular: Positive for leg swelling.  Endocrine: Negative.   Genitourinary: Positive for frequency.  Musculoskeletal: Negative.   Skin: Negative.   Allergic/Immunologic: Negative.   Neurological: Negative.   Hematological: Negative.   Psychiatric/Behavioral: Negative.     Social History   Socioeconomic History  . Marital status: Married    Spouse name: Not on file  . Number of children: Not on file  . Years of education: Not on file  . Highest education level: Not on file  Occupational History  . Not on file  Tobacco Use  . Smoking status: Former Research scientist (life sciences)  . Smokeless tobacco: Never Used  Substance and Sexual Activity  . Alcohol use: Never  . Drug use: Never  . Sexual activity: Yes    Partners: Female  Other Topics Concern  . Not on file  Social History Narrative  . Not on file   Social Determinants of Health   Financial Resource Strain:   . Difficulty of Paying Living Expenses: Not on file  Food Insecurity:   . Worried About Charity fundraiser in the Last Year: Not on file  . Ran Out of Food in the Last Year: Not on file  Transportation Needs:   . Lack of Transportation (Medical): Not on file  . Lack of Transportation (Non-Medical): Not on file  Physical Activity:   . Days of Exercise per Week: Not on file  . Minutes of Exercise per Session:  Not on file  Stress:   . Feeling of Stress : Not on file  Social Connections:   . Frequency of Communication with Friends and Family: Not on file  . Frequency of Social Gatherings with Friends and Family: Not on file  . Attends Religious Services: Not on file  . Active Member of Clubs or Organizations: Not on file  . Attends Archivist Meetings: Not on file  . Marital Status: Not on file  Intimate Partner Violence:   . Fear of Current or Ex-Partner: Not on file  . Emotionally Abused: Not on file  . Physically Abused: Not on file  . Sexually Abused: Not on file    Past Medical History:  Diagnosis Date  . Aortic stenosis    a. s/p TAVR 03/2017  . CAD (coronary artery disease)    a. s/p 3-v CABG in 07/2007  . Carotid artery disease ([place])    a. s/p left sided CEA complicated by RLN parlaysis   . Cataract   . DVT (deep venous thrombosis) ([place]) 08/2010  . 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 GERD (gastroesophageal reflux disease)   . HLD (hyperlipidemia)   . Hyperlipidemia 10/15/2015  . Hypertension   . Nephrolithiasis 11/16/2018  . Pulmonary embolism ([place]) 08/2010  . Skin cancer   . Sleep apnea   . Stroke [facility])      Patient Active Problem List   Diagnosis  Date Noted  . Bacteremia   . Common bile duct stone   . Ascending cholangitis 11/17/2018  . 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 11/16/2018  . Dysmetabolic syndrome [id number]  . 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 Hyperglycemia 11/16/2018  . Tear of meniscus of left knee 05/24/2018  . Constipation 10/07/2016  . Aortic stenosis 10/15/2015  . Benign essential hypertension 10/15/2015  . Carotid stenosis, bilateral 10/15/2015  . 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 Nephrolithiasis 10/15/2015  . Sleep apnea 10/15/2015  . Hyperlipidemia 10/15/2015  . RCT (rotator cuff tear) 06/03/2009  . Arteriosclerotic coronary artery disease 07/11/2007  . 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  Dyslipidemia 09/11/2003    Past Surgical History:  Procedure Laterality Date  . Carotid surgery Left 03/08/2007  . CHOLECYSTECTOMY  02/16/2018  . ERCP N/A 11/18/2018   Procedure: ENDOSCOPIC RETROGRADE CHOLANGIOPANCREATOGRAPHY (ERCP);  Surgeon: [name], MD;  Location: [facility] ENDOSCOPY;  Service: Endoscopy;  Laterality: N/A;  . Heart Bypass N/A 07/2007  . left shoulder surgery Left 06/03/2009  . RIGHT AND LEFT HEART CATH  07/18/2007  . right shoulder surgery  2003  . TEE WITHOUT CARDIOVERSION N/A 11/21/2018   Procedure: TRANSESOPHAGEAL ECHOCARDIOGRAM (TEE);  Surgeon: Wellington Hampshire, MD;  Location: [facility] ORS;  Service: Cardiovascular;  Laterality: N/A;  . TVAR      His family history includes Hypertension in his mother; Lung cancer in his father; Non-Hodgkin's lymphoma in his brother; Stroke in his mother.   Current Outpatient Medications:  .  amLODipine (NORVASC) 5 MG tablet, Take 1 tablet (5 mg total) by mouth daily., Disp: 90 tablet, Rfl: 3 .  aspirin 81 MG chewable tablet, Chew 81 mg by mouth daily., Disp: , Rfl:  .  docusate sodium (COLACE) 250 MG capsule, Take 250 mg by mouth daily as needed for constipation. , Disp: , Rfl:  .  montelukast (SINGULAIR) 10 MG tablet, Take 10 mg by mouth at bedtime as needed (allergies). , Disp: , Rfl:  .  Multiple Vitamin (MULTIVITAMIN) tablet, Take 1 tablet by mouth daily., Disp: , Rfl:  .  omeprazole (PRILOSEC) 20 MG capsule, Take 20 mg by mouth daily., Disp: , Rfl:  .  rosuvastatin (CRESTOR) 20 MG tablet, Take 20 mg by mouth daily., Disp: , Rfl:  .  Vitamins/Minerals TABS, Take by mouth., Disp: , Rfl:  .  metoprolol tartrate (LOPRESSOR) 25 MG tablet, Take 25 mg by mouth 2 (two) times daily. , Disp: , Rfl:   Patient Care Team: Jerrol Banana., MD as PCP - General (Family Medicine)    Objective:    Vitals: There were no vitals taken for this visit.  Physical Exam Vitals reviewed.  Constitutional:      Appearance: Normal  appearance.  HENT:     Head: Normocephalic and atraumatic.     Right Ear: External ear normal.     Left Ear: External ear normal.  Eyes:     General: No scleral icterus.    Conjunctiva/sclera: Conjunctivae normal.  Cardiovascular:     Rate and Rhythm: Normal rate and regular rhythm.     Heart sounds: Normal heart sounds.  Pulmonary:     Breath sounds: Normal breath sounds.  Abdominal:     Palpations: Abdomen is soft.  Genitourinary:    Penis: Normal.      Testes: Normal.     Prostate: Normal.     Rectum: Normal. Guaiac result negative.  Skin:    General: Skin is warm and dry.  Neurological:  General: No focal deficit present.     Mental Status: He is alert and oriented to person, place, and time.  Psychiatric:        Mood and Affect: Mood normal.        Behavior: Behavior normal.        Thought Content: Thought content normal.        Judgment: Judgment normal.     Activities of Daily Living In your present state of health, do you have any difficulty performing the following activities: 02/16/2019 11/17/2018  Hearing? Y N  Vision? N N  Difficulty concentrating or making decisions? N N  Walking or climbing stairs? N N  Dressing or bathing? N N  Doing errands, shopping? N N    Fall Risk Assessment Fall Risk  02/16/2019 10/24/2018  Falls in the past year? 0 0  Number falls in past yr: 0 0  Injury with Fall? 0 0  Follow up Falls evaluation completed -     Depression Screen PHQ 2/9 Scores 02/16/2019 10/24/2018  PHQ - 2 Score 0 0  PHQ- 9 Score 0 0    6CIT Screen 02/16/2019  What Year? 0 points  What month? 0 points  What time? 0 points  Count back from 20 2 points  Months in reverse 0 points  Repeat phrase 2 points  Total Score 4      Assessment & Plan:     Annual Wellness Visit  Reviewed patient's Family Medical History Reviewed and updated list of patient's medical providers Assessment of cognitive impairment was done Assessed patient's functional  ability Established a written schedule for health screening [place] Completed and Reviewed  Exercise Activities and Dietary recommendations Goals   None     Immunization History  Administered Date(s) Administered  . Influenza Split 11/18/2009, 11/14/2010, 11/16/2011, 12/02/2012, 11/24/2013, 12/04/2014, 11/15/2017  . Influenza, High Dose Seasonal PF 12/27/2015, 11/10/2016, 11/15/2017, 11/25/2017, 10/24/2018  . Influenza, Seasonal, Injecte, Preservative Fre 11/16/2011, 12/02/2012, 11/24/2013  . Influenza,inj,Quad PF,6+ Mos 12/05/2014  . Influenza,inj,quad, With Preservative 12/05/2014  . Pneumococcal Conjugate-13 09/14/2013  . Pneumococcal Polysaccharide-23 03/03/2007  . Tdap 05/01/2011  . Tetanus 10/19/1985, 03/03/2007  . Zoster 03/08/2007    Health Maintenance  Topic Date Due  . Samul Dada  04/30/2021  . INFLUENZA VACCINE  Completed  . PNA vac Low Risk Adult  Completed     Discussed health benefits of physical activity, and encouraged him to engage in regular exercise appropriate for his age and condition.    ------------------------------------------------------------------------------------------------------------  1. Benign prostatic hyperplasia without lower urinary tract symptoms  - POCT Urinalysis Dipstick  2. Benign essential hypertension Increase the Metoprolol 25mg  1 1/2 tablets 2 times a day.   3. Encounter for screening fecal occult blood testing  - IFOBT POC (occult bld, rslt in office); Future - IFOBT POC (occult bld, rslt in office)  4. Arteriosclerotic coronary artery disease   5. Obstructive sleep apnea syndrome   6. Ascending cholangitis Resolving post chlecystectomy.  7. Mixed hyperlipidemia   8. Hyperglycemia   [name], MD  [facility]

## 2019-02-16 ENCOUNTER — Encounter: Payer: Self-pay | Admitting: Family Medicine

## 2019-02-16 ENCOUNTER — Other Ambulatory Visit: Payer: Self-pay

## 2019-02-16 ENCOUNTER — Ambulatory Visit (INDEPENDENT_AMBULATORY_CARE_PROVIDER_SITE_OTHER): Payer: Medicare Other | Admitting: Family Medicine

## 2019-02-16 DIAGNOSIS — N400 Enlarged prostate without lower urinary tract symptoms: Secondary | ICD-10-CM

## 2019-02-16 DIAGNOSIS — Z1211 Encounter for screening for malignant neoplasm of colon: Secondary | ICD-10-CM | POA: Diagnosis not present

## 2019-02-16 DIAGNOSIS — I2510 Atherosclerotic heart disease of native coronary artery without angina pectoris: Secondary | ICD-10-CM | POA: Diagnosis not present

## 2019-02-16 DIAGNOSIS — G4733 Obstructive sleep apnea (adult) (pediatric): Secondary | ICD-10-CM

## 2019-02-16 DIAGNOSIS — K8309 Other cholangitis: Secondary | ICD-10-CM

## 2019-02-16 DIAGNOSIS — E782 Mixed hyperlipidemia: Secondary | ICD-10-CM

## 2019-02-16 DIAGNOSIS — R739 Hyperglycemia, unspecified: Secondary | ICD-10-CM

## 2019-02-16 DIAGNOSIS — I10 Essential (primary) hypertension: Secondary | ICD-10-CM | POA: Diagnosis not present

## 2019-02-16 LAB — POCT URINALYSIS DIPSTICK
Bilirubin, UA: NEGATIVE
Blood, UA: NEGATIVE
Glucose, UA: NEGATIVE
Ketones, UA: NEGATIVE
Leukocytes, UA: NEGATIVE
Nitrite, UA: NEGATIVE
Protein, UA: NEGATIVE
Spec Grav, UA: 1.025 (ref 1.010–1.025)
Urobilinogen, UA: 0.2 E.U./dL
pH, UA: 6 (ref 5.0–8.0)

## 2019-02-16 LAB — IFOBT (OCCULT BLOOD): IFOBT: NEGATIVE

## 2019-02-16 MED ORDER — METOPROLOL TARTRATE 25 MG PO TABS: ORAL_TABLET | ORAL | 3 refills | Status: DC

## 2019-02-16 NOTE — Patient Instructions (Signed)
Increase the Metoprolol 25mg  1 1/2 tablets 2 times a day.

## 2019-04-10 ENCOUNTER — Other Ambulatory Visit: Payer: Self-pay | Admitting: Family Medicine

## 2019-04-10 MED ORDER — ROSUVASTATIN CALCIUM 20 MG PO TABS: 20.0000 mg | ORAL_TABLET | Freq: Every day | ORAL | 3 refills | Status: DC

## 2019-04-10 NOTE — Telephone Encounter (Signed)
Medication Refill - Medication: rosuvastatin   Has the patient contacted their pharmacy? Yes.   (Agent: If no, request that the patient contact the pharmacy for the refill.) (Agent: If yes, when and what did the pharmacy advise?)  Preferred Pharmacy (with phone number or street name):  [place], [place] - [address] [address]  Phone: [phone] Fax: [phone]  Not a 24 hour pharmacy; exact hours not known.     Agent: Please be advised that RX refills may take up to 3 business days. We ask that you follow-up with your pharmacy.

## 2019-04-10 NOTE — Telephone Encounter (Signed)
Please advise?  refill provider has not refilled this before.

## 2019-04-10 NOTE — Telephone Encounter (Signed)
Crestor previously prescribed by historical provider.   Medication is on current medication profile.   Please provide a new order.

## 2019-04-11 ENCOUNTER — Encounter: Payer: Self-pay | Admitting: Family Medicine

## 2019-04-11 ENCOUNTER — Ambulatory Visit (INDEPENDENT_AMBULATORY_CARE_PROVIDER_SITE_OTHER): Payer: Medicare Other | Admitting: Family Medicine

## 2019-04-11 ENCOUNTER — Other Ambulatory Visit: Payer: Self-pay

## 2019-04-11 VITALS — BP 148/71 | HR 57 | Temp 97.5°F | Resp 18 | Ht 66.0 in | Wt 241.0 lb

## 2019-04-11 DIAGNOSIS — B351 Tinea unguium: Secondary | ICD-10-CM | POA: Diagnosis not present

## 2019-04-11 DIAGNOSIS — I2510 Atherosclerotic heart disease of native coronary artery without angina pectoris: Secondary | ICD-10-CM | POA: Diagnosis not present

## 2019-04-11 DIAGNOSIS — E782 Mixed hyperlipidemia: Secondary | ICD-10-CM

## 2019-04-11 DIAGNOSIS — I10 Essential (primary) hypertension: Secondary | ICD-10-CM

## 2019-04-11 NOTE — Progress Notes (Signed)
Patient: Jacob Montes Male    DOB: 03-May-1940   79 y.o.   MRN: [id number] Visit Date: 04/11/2019  Today's Provider: [name], MD   Chief Complaint  Patient presents with  . Nail Problem   Subjective:     HPI   Patient is here concerning nails on both big toes. Patient believes he has a fungus on his toe nails. Nails are thick and discolored. Also the second toe on his right foot, the nail is beginning to turn colors. Patient states there is some pain in the left big toe. Patient has used OTC toe nail fungus medication with no relief.  Very getting thick enough where it is very hard to cut but it also is painful at times.  He wonders what his options are for treatment.  Mainly just involves the the nails of both first toes.  He otherwise feels well. No color changes to his toes or feet, no pain.  He states for a little while last week he had a period where both feet felt cold. Allergies  Allergen Reactions  . Atorvastatin Other (See Comments)    aches  . Ramipril Other (See Comments)    cough     Current Outpatient Medications:  .  amLODipine (NORVASC) 5 MG tablet, Take 1 tablet (5 mg total) by mouth daily., Disp: 90 tablet, Rfl: 3 .  aspirin 81 MG chewable tablet, Chew 81 mg by mouth daily., Disp: , Rfl:  .  docusate sodium (COLACE) 250 MG capsule, Take 250 mg by mouth daily as needed for constipation. , Disp: , Rfl:  .  metoprolol tartrate (LOPRESSOR) 25 MG tablet, Take 1 1/2 tablet by mouth twice a day., Disp: 270 tablet, Rfl: 3 .  montelukast (SINGULAIR) 10 MG tablet, Take 10 mg by mouth at bedtime as needed (allergies). , Disp: , Rfl:  .  Multiple Vitamin (MULTIVITAMIN) tablet, Take 1 tablet by mouth daily., Disp: , Rfl:  .  omeprazole (PRILOSEC) 20 MG capsule, Take 20 mg by mouth daily., Disp: , Rfl:  .  rosuvastatin (CRESTOR) 20 MG tablet, Take 1 tablet (20 mg total) by mouth daily., Disp: 90 tablet, Rfl: 3 .  Vitamins/Minerals TABS, Take by mouth., Disp: ,  Rfl:   Review of Systems  Constitutional: Negative for appetite change, chills and fever.  HENT: Negative.   Eyes: Negative.   Respiratory: Negative for chest tightness, shortness of breath and wheezing.   Cardiovascular: Negative for chest pain and palpitations.  Gastrointestinal: Negative for abdominal pain, nausea and vomiting.  Endocrine: Negative.   Skin:       See HPI.  Allergic/Immunologic: Negative.   Hematological: Negative.   Psychiatric/Behavioral: Negative.     Social History   Tobacco Use  . Smoking status: Former Research scientist (life sciences)  . Smokeless tobacco: Never Used  Substance Use Topics  . Alcohol use: Never      Objective:   BP (!) 148/71 (BP Location: Left Arm, Patient Position: Sitting, Cuff Size: Large)   Pulse (!) 57   Temp (!) 97.5 F (36.4 C) (Other (Comment))   Resp 18   Ht 5\' 6"  (1.676 m)   Wt 241 lb (109.3 kg)   SpO2 95%   BMI 38.90 kg/m  Vitals:   04/11/19 1459  BP: (!) 148/71  Pulse: (!) 57  Resp: 18  Temp: (!) 97.5 F (36.4 C)  TempSrc: Other (Comment)  SpO2: 95%  Weight: 241 lb (109.3 kg)  Height: 5\' 6"  (  1.676 m)  Body mass index is 38.9 kg/m.   Physical Exam Vitals reviewed.  Constitutional:      Appearance: He is obese.  HENT:     Head: Normocephalic and atraumatic.     Right Ear: External ear normal.     Left Ear: External ear normal.  Cardiovascular:     Heart sounds: Normal heart sounds.  Pulmonary:     Effort: Pulmonary effort is normal.     Breath sounds: Normal breath sounds.  Skin:    General: Skin is warm and dry.     Comments: Thickening of both great toenails and yellowing consistent with onychomycosis.  Neurological:     Mental Status: He is alert and oriented to person, place, and time.  Psychiatric:        Mood and Affect: Mood normal.        Behavior: Behavior normal.        Thought Content: Thought content normal.        Judgment: Judgment normal.      No results found for any visits on 04/11/19.       Assessment & Plan    1. Onychomycosis Referred to [facility] Dr. Elvina Mattes or Dr. Cleda Mccreedy.  After consideration and discussion with patient I am not sure is worth the risk of antifungal's orally for 6 months.  In an ideal world I want to stop his statin and I do not think that should be done in the face of his cardiovascular disease.  Will ask podiatry further input. - Ambulatory referral to Podiatry  2. Arteriosclerotic coronary artery disease See discussion above.  3. Benign essential hypertension   4. Mixed hyperlipidemia     I,Lashaun Krapf,acting as a scribe for [name], MD.,have documented all relevant documentation on the behalf of [name], MD,as directed by  [name], MD while in the presence of [name], MD.     [name], MD  [facility]

## 2019-05-12 NOTE — Progress Notes (Signed)
Patient: Jacob Montes Male    DOB: November 15, 1940   79 y.o.   MRN: [id number] Visit Date: 05/22/2019  Today's Provider: [name], MD   Chief Complaint  Patient presents with  . Follow-up  . Hypertension  . Hyperlipidemia   Subjective:     HPI  Patient comes in today for follow-up of hypertension.  He is feeling well.  He has no complaints.  Pressures at home are in the 130s to 160s infrequently.  Diaystolics are all 0000000 and 70s. Onychomycosis From 04/11/2019-Referred to [facility], Dr. Elvina Mattes or Dr. Cleda Mccreedy. After consideration and discussion with patient I am not sure is worth the risk of antifungal's orally for 6 months. In an ideal world I want to stop his statin and I do not think that should be done in the face of his cardiovascular disease. Will ask podiatry further input.  Arteriosclerotic coronary artery disease From 04/11/2019-See discussion above.  Benign essential hypertension From 04/11/2019-on metoprolol 25 mg and amlodipine 5 mg.   Mixed hyperlipidemia From 04/11/2019-on rosuvastatin 20 mg.   Allergies  Allergen Reactions  . Atorvastatin Other (See Comments)    aches  . Ramipril Other (See Comments)    cough     Current Outpatient Medications:  .  amLODipine (NORVASC) 5 MG tablet, Take 1 tablet (5 mg total) by mouth daily., Disp: 90 tablet, Rfl: 3 .  aspirin 81 MG chewable tablet, Chew 81 mg by mouth daily., Disp: , Rfl:  .  metoprolol tartrate (LOPRESSOR) 25 MG tablet, Take 1 1/2 tablet by mouth twice a day., Disp: 270 tablet, Rfl: 3 .  montelukast (SINGULAIR) 10 MG tablet, Take 10 mg by mouth at bedtime as needed (allergies). , Disp: , Rfl:  .  Multiple Vitamin (MULTIVITAMIN) tablet, Take 1 tablet by mouth daily., Disp: , Rfl:  .  omeprazole (PRILOSEC) 20 MG capsule, Take 20 mg by mouth daily., Disp: , Rfl:  .  rosuvastatin (CRESTOR) 20 MG tablet, Take 1 tablet (20 mg total) by mouth daily., Disp: 90 tablet, Rfl: 3 .  Vitamins/Minerals  TABS, Take by mouth., Disp: , Rfl:  .  docusate sodium (COLACE) 250 MG capsule, Take 250 mg by mouth daily as needed for constipation. , Disp: , Rfl:   Review of Systems  Constitutional: Negative for appetite change, chills and fever.  HENT: Negative.   Eyes: Negative.   Respiratory: Negative for chest tightness, shortness of breath and wheezing.   Cardiovascular: Negative for chest pain and palpitations.  Gastrointestinal: Negative for abdominal pain, nausea and vomiting.  Endocrine: Negative.   Genitourinary: Negative.   Allergic/Immunologic: Negative.   Neurological: Negative.   Hematological: Negative.   Psychiatric/Behavioral: Negative.     Social History   Tobacco Use  . Smoking status: Former Research scientist (life sciences)  . Smokeless tobacco: Never Used  Substance Use Topics  . Alcohol use: Never      Objective:   BP (!) 158/63 (BP Location: Right Arm, Patient Position: Sitting, Cuff Size: Large)   Pulse (!) 54   Temp (!) 97.3 F (36.3 C) (Other (Comment))   Resp 18   Ht 5\' 6"  (1.676 m)   Wt 239 lb (108.4 kg)   SpO2 97%   BMI 38.58 kg/m  Vitals:   05/22/19 1349  BP: (!) 158/63  Pulse: (!) 54  Resp: 18  Temp: (!) 97.3 F (36.3 C)  TempSrc: Other (Comment)  SpO2: 97%  Weight: 239 lb (108.4 kg)  Height: 5\' 6"  (  1.676 m)  Body mass index is 38.58 kg/m.   Physical Exam Vitals reviewed.  Constitutional:      Appearance: Normal appearance.  HENT:     Head: Normocephalic and atraumatic.     Right Ear: External ear normal.     Left Ear: External ear normal.  Eyes:     General: No scleral icterus.    Conjunctiva/sclera: Conjunctivae normal.  Cardiovascular:     Rate and Rhythm: Normal rate and regular rhythm.     Heart sounds: Normal heart sounds.  Pulmonary:     Breath sounds: Normal breath sounds.  Abdominal:     Palpations: Abdomen is soft.  Genitourinary:    Penis: Normal.      Testes: Normal.     Prostate: Normal.     Rectum: Normal. Guaiac result negative.    Musculoskeletal:     Comments: He has trace ankle and pedal edema.  Skin:    General: Skin is warm and dry.  Neurological:     General: No focal deficit present.     Mental Status: He is alert and oriented to person, place, and time.  Psychiatric:        Mood and Affect: Mood normal.        Behavior: Behavior normal.        Thought Content: Thought content normal.        Judgment: Judgment normal.      No results found for any visits on 05/22/19.     Assessment & Plan    1. Benign essential hypertension Increase amlodipine from 5 mg to 10 mg qd.  - amLODipine (NORVASC) 10 MG tablet; Take 1 tablet (10 mg total) by mouth daily.  Dispense: 90 tablet; Refill: 3 2. Aortic valve stenosis, etiology of cardiac valve disease unspecified   3. Mixed hyperlipidemia   4. Coronary artery disease of bypass graft of native heart with stable angina pectoris ([place]) All risk factors treated.   Follow up in 2 months.    I,[name],acting as a scribe for [name], MD.,have documented all relevant documentation on the behalf of [name], MD,as directed by  [name], MD while in the presence of [name], MD.      [name], MD  [facility]

## 2019-05-17 ENCOUNTER — Ambulatory Visit: Payer: Self-pay | Admitting: Family Medicine

## 2019-05-22 ENCOUNTER — Other Ambulatory Visit: Payer: Self-pay

## 2019-05-22 ENCOUNTER — Encounter: Payer: Self-pay | Admitting: Family Medicine

## 2019-05-22 ENCOUNTER — Ambulatory Visit (INDEPENDENT_AMBULATORY_CARE_PROVIDER_SITE_OTHER): Payer: Medicare Other | Admitting: Family Medicine

## 2019-05-22 VITALS — BP 158/63 | HR 54 | Temp 97.3°F | Resp 18 | Ht 66.0 in | Wt 239.0 lb

## 2019-05-22 DIAGNOSIS — E782 Mixed hyperlipidemia: Secondary | ICD-10-CM

## 2019-05-22 DIAGNOSIS — I2510 Atherosclerotic heart disease of native coronary artery without angina pectoris: Secondary | ICD-10-CM | POA: Diagnosis not present

## 2019-05-22 DIAGNOSIS — I25708 Atherosclerosis of coronary artery bypass graft(s), unspecified, with other forms of angina pectoris: Secondary | ICD-10-CM | POA: Diagnosis not present

## 2019-05-22 DIAGNOSIS — I10 Essential (primary) hypertension: Secondary | ICD-10-CM

## 2019-05-22 DIAGNOSIS — I350 Nonrheumatic aortic (valve) stenosis: Secondary | ICD-10-CM | POA: Diagnosis not present

## 2019-05-22 MED ORDER — AMLODIPINE BESYLATE 10 MG PO TABS: 10.0000 mg | ORAL_TABLET | Freq: Every day | ORAL | 3 refills | Status: DC

## 2019-05-22 MED ORDER — MONTELUKAST SODIUM 10 MG PO TABS: 10.0000 mg | ORAL_TABLET | Freq: Every evening | ORAL | 3 refills | Status: AC | PRN

## 2019-05-24 ENCOUNTER — Telehealth: Payer: Self-pay | Admitting: Family Medicine

## 2019-05-24 DIAGNOSIS — I10 Essential (primary) hypertension: Secondary | ICD-10-CM

## 2019-05-24 MED ORDER — AMLODIPINE BESYLATE 10 MG PO TABS: 10.0000 mg | ORAL_TABLET | Freq: Every day | ORAL | 3 refills | Status: DC

## 2019-05-24 MED ORDER — METOPROLOL TARTRATE 25 MG PO TABS: ORAL_TABLET | ORAL | 3 refills | Status: DC

## 2019-05-24 NOTE — Telephone Encounter (Signed)
Pts wife called to see why his amLODipine (NORVASC) 10 MG tablet Was increased to 10mg  when the bottle he brought in with him was 5mg   metoprolol tartrate (LOPRESSOR) 25 MG tablet  Pt will also run out of this due to adding taking a half of pill to the 1 tab he was already taking/ please send new Rx with the correct quanity /please advise

## 2019-06-29 ENCOUNTER — Telehealth: Payer: Self-pay | Admitting: Family Medicine

## 2019-06-29 NOTE — Telephone Encounter (Signed)
Left message for patient to schedule Annual Wellness Visit. Marland KitchenPlease schedule with Nurse Health [place], RN at [facility].  If patient returns call, please schedule AWVI with [facility] any date

## 2019-07-25 NOTE — Progress Notes (Signed)
Established patient visit   Patient: Jacob Montes   DOB: [birth year]   79 y.o. Male  MRN: [id number] Visit Date: 07/27/2019  Today's healthcare provider: [name], MD   Chief Complaint  Patient presents with  . Hypertension   [employer] as a scribe for [name], MD.,have documented all relevant documentation on the behalf of [name], MD,as directed by  [name], MD while in the presence of [name], MD.  Subjective    HPI  Patient feels well and has no complaints except for nocturia x5-7 times per night.  He states when he takes an ibuprofen before bedtime that it does not happen.  No problems with urinary flow hesitancy or dysuria no flank pain or hematuria. He and his wife are doing well.  They have found a local church. Hypertension, follow-up  BP Readings from Last 3 Encounters:  07/27/19 (!) 129/52  05/22/19 (!) 158/63  04/11/19 (!) 148/71   Wt Readings from Last 3 Encounters:  07/27/19 243 lb (110.2 kg)  05/22/19 239 lb (108.4 kg)  04/11/19 241 lb (109.3 kg)     He was last seen for hypertension 2 months ago.  BP at that visit was 158/63. Management since that visit includes increase Amlodipine from 5 mg to 10 mg.  He reports good compliance with treatment. He is not having side effects.  He is following a Regular diet.    He is exercising lightly. He does not smoke.  Use of agents associated with hypertension: none.   Outside blood pressures are being checked at home. BP readings have improved. Symptoms: No chest pain No chest pressure  No palpitations No syncope  No dyspnea No orthopnea  No paroxysmal nocturnal dyspnea Yes lower extremity edema   Pertinent labs: Lab Results  Component Value Date   CHOL 133 10/24/2018   HDL 45 10/24/2018   LDLCALC 69 10/24/2018   TRIG 102 10/24/2018   CHOLHDL 3.0 10/24/2018   Lab Results  Component Value Date   NA 140 12/01/2018   K 4.8 12/01/2018     CREATININE 0.78 12/01/2018   GFRNONAA 86 12/01/2018   GFRAA 100 12/01/2018   GLUCOSE 77 12/01/2018     The 10-year ASCVD risk score Mikey Bussing DC Jr., et al., 2013) is: 32%   ---------------------------------------------------------------------------------------------------       Medications: Outpatient Medications Prior to Visit  Medication Sig  . amLODipine (NORVASC) 10 MG tablet Take 1 tablet (10 mg total) by mouth daily.  Marland Kitchen aspirin 81 MG chewable tablet Chew 81 mg by mouth daily.  Marland Kitchen docusate sodium (COLACE) 250 MG capsule Take 250 mg by mouth daily as needed for constipation.   . metoprolol tartrate (LOPRESSOR) 25 MG tablet Take 1 1/2 tablet by mouth twice a day.  . montelukast (SINGULAIR) 10 MG tablet Take 1 tablet (10 mg total) by mouth at bedtime as needed (allergies).  . Multiple Vitamin (MULTIVITAMIN) tablet Take 1 tablet by mouth daily.  Marland Kitchen omeprazole (PRILOSEC) 20 MG capsule Take 20 mg by mouth daily.  . rosuvastatin (CRESTOR) 20 MG tablet Take 1 tablet (20 mg total) by mouth daily.  . Vitamins/Minerals TABS Take by mouth.   No facility-administered medications prior to visit.    Review of Systems  Constitutional: Negative for appetite change, chills and fever.  Respiratory: Negative for chest tightness, shortness of breath and wheezing.   Cardiovascular: Negative for chest pain and palpitations.  Gastrointestinal: Negative for abdominal pain,  nausea and vomiting.       Objective    BP (!) 129/52 (BP Location: Right Arm, Patient Position: Sitting, Cuff Size: Large)   Pulse (!) 51   Temp (!) 97.1 F (36.2 C) (Temporal)   Wt 243 lb (110.2 kg)   BMI 39.22 kg/m  BP Readings from Last 3 Encounters:  07/27/19 (!) 129/52  05/22/19 (!) 158/63  04/11/19 (!) 148/71   Wt Readings from Last 3 Encounters:  07/27/19 243 lb (110.2 kg)  05/22/19 239 lb (108.4 kg)  04/11/19 241 lb (109.3 kg)      Physical Exam   General: Appearance:    Obese male in no acute  distress  Eyes:    PERRL, conjunctiva/corneas clear, EOM's intact       Lungs:     Clear to auscultation bilaterally, respirations unlabored  Heart:    Bradycardic. Normal rhythm.  No murmurs, rubs, or gallops.  harsh, crescendo-decrescendo, systolic murmur at right upper sternal border  MS:   All extremities are intact.   Neurologic:   Awake, alert, oriented x 3. No apparent focal neurological           defect.       Results for orders placed or performed in visit on 07/27/19  POCT HgB A1C  Result Value Ref Range   Hemoglobin A1C 5.8 (A) 4.0 - 5.6 %   Est. average glucose Bld gHb Est-mCnc 120   POCT Urinalysis Dipstick  Result Value Ref Range   Color, UA tea colored    Clarity, UA cloudy    Glucose, UA Negative Negative   Bilirubin, UA negative    Ketones, UA negative    Spec Grav, UA >=1.030 (A) 1.010 - 1.025   Blood, UA negative    pH, UA 5.0 5.0 - 8.0   Protein, UA Negative Negative   Urobilinogen, UA 0.2 0.2 or 1.0 E.U./dL   Nitrite, UA negative    Leukocytes, UA Negative Negative   Appearance     Odor      Assessment & Plan    1. Benign essential hypertension Well controlled Continue current medications Recheck metabolic panel F/u in 2 months  - Comprehensive metabolic panel  2. Nocturia Take Ibuprofen 200 mg at bedtime .  My guess is that he is having pain when he rolls over at night and this wakes him up and he feels the need to void.  When he is not awakened by the pain because of the nonsteroidal and he sleeps through the night.  We will try this for a couple of months and see him back. - POCT HgB A1C - POCT Urinalysis Dipstick - Comprehensive metabolic panel  3. Hyperglycemia  - POCT HgB A1C - POCT Urinalysis Dipstick  4. Mixed hyperlipidemia All risk factors for CAD treated.  He is tolerating Crestor well. - Comprehensive metabolic panel  5. Dehydration   Push more fluids  Return in about 2 months (around 09/26/2019).      I, [name], MD, have reviewed all documentation for this visit. The documentation on 07/31/19 for the exam, diagnosis, procedures, and orders are all accurate and complete.    Castle Lamons Cranford Mon, MD  [facility] [phone] (phone) [phone] (fax)  [place]

## 2019-07-27 ENCOUNTER — Other Ambulatory Visit: Payer: Self-pay

## 2019-07-27 ENCOUNTER — Ambulatory Visit (INDEPENDENT_AMBULATORY_CARE_PROVIDER_SITE_OTHER): Payer: Medicare Other | Admitting: Family Medicine

## 2019-07-27 ENCOUNTER — Encounter: Payer: Self-pay | Admitting: Family Medicine

## 2019-07-27 ENCOUNTER — Ambulatory Visit: Payer: Medicare Other

## 2019-07-27 VITALS — BP 129/52 | HR 51 | Temp 97.1°F | Wt 243.0 lb

## 2019-07-27 DIAGNOSIS — R351 Nocturia: Secondary | ICD-10-CM

## 2019-07-27 DIAGNOSIS — I2510 Atherosclerotic heart disease of native coronary artery without angina pectoris: Secondary | ICD-10-CM | POA: Diagnosis not present

## 2019-07-27 DIAGNOSIS — I10 Essential (primary) hypertension: Secondary | ICD-10-CM | POA: Diagnosis not present

## 2019-07-27 DIAGNOSIS — E860 Dehydration: Secondary | ICD-10-CM

## 2019-07-27 DIAGNOSIS — E782 Mixed hyperlipidemia: Secondary | ICD-10-CM

## 2019-07-27 DIAGNOSIS — R739 Hyperglycemia, unspecified: Secondary | ICD-10-CM

## 2019-07-27 LAB — POCT URINALYSIS DIPSTICK
Bilirubin, UA: NEGATIVE
Blood, UA: NEGATIVE
Glucose, UA: NEGATIVE
Ketones, UA: NEGATIVE
Leukocytes, UA: NEGATIVE
Nitrite, UA: NEGATIVE
Protein, UA: NEGATIVE
Spec Grav, UA: >=1.03 — AB (ref 1.010–1.025)
Urobilinogen, UA: 0.2 E.U./dL
pH, UA: 5 (ref 5.0–8.0)

## 2019-07-27 LAB — POCT GLYCOSYLATED HEMOGLOBIN (HGB A1C)
Est. average glucose Bld gHb Est-mCnc: 120
Hemoglobin A1C: 5.8 % — AB (ref 4.0–5.6)

## 2019-07-28 LAB — COMPREHENSIVE METABOLIC PANEL
ALT: 23 IU/L (ref 0–44)
AST: 23 IU/L (ref 0–40)
Albumin/Globulin Ratio: 1.9 (ref 1.2–2.2)
Albumin: 4.3 g/dL (ref 3.7–4.7)
Alkaline Phosphatase: 46 IU/L — ABNORMAL LOW (ref 48–121)
BUN/Creatinine Ratio: 21 (ref 10–24)
BUN: 23 mg/dL (ref 8–27)
Bilirubin Total: 1.1 mg/dL (ref 0.0–1.2)
CO2: 26 mmol/L (ref 20–29)
Calcium: 9.6 mg/dL (ref 8.6–10.2)
Chloride: 106 mmol/L (ref 96–106)
Creatinine, Ser: 1.1 mg/dL (ref 0.76–1.27)
GFR calc Af Amer: 74 mL/min/{1.73_m2} (ref >59)
GFR calc non Af Amer: 64 mL/min/{1.73_m2} (ref >59)
Globulin, Total: 2.3 g/dL (ref 1.5–4.5)
Glucose: 105 mg/dL — ABNORMAL HIGH (ref 65–99)
Potassium: 5.4 mmol/L — ABNORMAL HIGH (ref 3.5–5.2)
Sodium: 143 mmol/L (ref 134–144)
Total Protein: 6.6 g/dL (ref 6.0–8.5)

## 2019-07-31 ENCOUNTER — Ambulatory Visit: Payer: Self-pay | Admitting: Family Medicine

## 2019-07-31 NOTE — Progress Notes (Signed)
Subjective:   Jacob Montes is a 79 y.o. male who presents for an Initial Medicare Annual Wellness Visit.  I connected with Abisai Deer today by telephone and verified that I am speaking with the correct person using two identifiers. Location patient: home Location provider: work Persons participating in the virtual visit: patient, provider.   I discussed the limitations, risks, security and privacy concerns of performing an evaluation and management service by telephone and the availability of in person appointments. I also discussed with the patient that there may be a patient responsible charge related to this service. The patient expressed understanding and verbally consented to this telephonic visit.    Interactive audio and video telecommunications were attempted between this provider and patient, however failed, due to patient having technical difficulties OR patient did not have access to video capability.  We continued and completed visit with audio only.  Review of Systems    N/A Cardiac Risk Factors include: advanced age (>57men, >41 women);dyslipidemia;hypertension;male gender;obesity (BMI >30kg/m2)     Objective:    There were no vitals filed for this visit. There is no height or weight on file to calculate BMI.  Advanced Directives 08/01/2019 11/17/2018  Does Patient Have a Medical Advance Directive? Yes Yes  Type of Paramedic of [place];Living will Living will;Healthcare Power of Attorney  Does patient want to make changes to medical advance directive? - No - Patient declined  Copy of [place] in Chart? No - copy requested No - copy requested    Current Medications (verified) Outpatient Encounter Medications as of 08/01/2019  Medication Sig  . amLODipine (NORVASC) 10 MG tablet Take 1 tablet (10 mg total) by mouth daily.  Marland Kitchen aspirin 81 MG chewable tablet Chew 81 mg by mouth daily.  Marland Kitchen docusate sodium (COLACE) 250 MG capsule  Take 250 mg by mouth daily as needed for constipation.   . metoprolol tartrate (LOPRESSOR) 25 MG tablet Take 1 1/2 tablet by mouth twice a day.  . montelukast (SINGULAIR) 10 MG tablet Take 1 tablet (10 mg total) by mouth at bedtime as needed (allergies).  . Multiple Vitamin (MULTIVITAMIN) tablet Take 1 tablet by mouth daily.  Marland Kitchen omeprazole (PRILOSEC) 20 MG capsule Take 20 mg by mouth daily. As needed  . rosuvastatin (CRESTOR) 20 MG tablet Take 1 tablet (20 mg total) by mouth daily.  . Vitamins/Minerals TABS Take by mouth. (Patient not taking: Reported on 08/01/2019)   No facility-administered encounter medications on file as of 08/01/2019.    Allergies (verified) Atorvastatin and Ramipril   History: Past Medical History:  Diagnosis Date  . Aortic stenosis    a. s/p TAVR 03/2017  . CAD (coronary artery disease)    a. s/p 3-v CABG in 07/2007  . Carotid artery disease ([place])    a. s/p left sided CEA complicated by RLN parlaysis   . Cataract   . DVT (deep venous thrombosis) ([place]) 08/2010  . 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 GERD (gastroesophageal reflux disease)   . HLD (hyperlipidemia)   . Hyperlipidemia 10/15/2015  . Hypertension   . Nephrolithiasis 11/16/2018  . Pulmonary embolism ([place]) 08/2010  . Skin cancer   . Sleep apnea   . Stroke [facility]    Past Surgical History:  Procedure Laterality Date  . Carotid surgery Left 03/08/2007  . CHOLECYSTECTOMY  02/16/2018  . ERCP N/A 11/18/2018   Procedure: ENDOSCOPIC RETROGRADE CHOLANGIOPANCREATOGRAPHY (ERCP);  Surgeon: [name], MD;  Location: [facility] ENDOSCOPY;  Service: Endoscopy;  Laterality: N/A;  . Heart Bypass N/A 07/2007  . left shoulder surgery Left 06/03/2009  . RIGHT AND LEFT HEART CATH  07/18/2007  . right shoulder surgery  2003  . TEE WITHOUT CARDIOVERSION N/A 11/21/2018   Procedure: TRANSESOPHAGEAL ECHOCARDIOGRAM (TEE);  Surgeon: Wellington Hampshire, MD;  Location: [facility] ORS;  Service: Cardiovascular;  Laterality: N/A;  [place]onia Farber     Family History  Problem Relation Age of Onset  . Hypertension Mother   . Stroke Mother   . Lung cancer Father   . Non-Hodgkin's lymphoma Brother    Social History   Socioeconomic History  . Marital status: Married    Spouse name: Not on file  . Number of children: 2  . Years of education: Not on file  . Highest education level: Some college, no degree  Occupational History  . Occupation: retired  Tobacco Use  . Smoking status: Former Smoker    Types: Cigarettes  . Smokeless tobacco: Never Used  . Tobacco comment: Quit in 1979  Vaping Use  . Vaping Use: Never used  Substance and Sexual Activity  . Alcohol use: Never  . Drug use: Never  . Sexual activity: Yes    Partners: Female  Other Topics Concern  . Not on file  Social History Narrative  . Not on file   Social Determinants of Health   Financial Resource Strain: Low Risk   . Difficulty of Paying Living Expenses: Not hard at all  Food Insecurity: No Food Insecurity  . Worried About Charity fundraiser in the Last Year: Never true  . Ran Out of Food in the Last Year: Never true  Transportation Needs: No Transportation Needs  . Lack of Transportation (Medical): No  . Lack of Transportation (Non-Medical): No  Physical Activity: Inactive  . Days of Exercise per Week: 0 days  . Minutes of Exercise per Session: 0 min  Stress: No Stress Concern Present  . Feeling of Stress : Not at all  Social Connections: Socially Integrated  . Frequency of Communication with Friends and Family: Three times a week  . Frequency of Social Gatherings with Friends and Family: More than three times a week  . Attends Religious Services: More than 4 times per year  . Active Member of Clubs or Organizations: Yes  . Attends Archivist Meetings: More than 4 times per year  . Marital Status: Married    Tobacco Counseling Counseling given: No Comment: Quit in 1979   Clinical Intake:  Pre-visit preparation completed:  Yes  Pain : No/denies pain     Nutritional Risks: None Diabetes: No  How often do you need to have someone help you when you read instructions, pamphlets, or other written materials from your doctor or pharmacy?: 1 - Never  Diabetic? No  Interpreter Needed?: No  Information entered by :: [facility], LPN   Activities of Daily Living In your present state of health, do you have any difficulty performing the following activities: 08/01/2019 02/16/2019  Hearing? Y Y  Comment Has hearing loss in the right ear. Does not wear hearing aids. -  Vision? N N  Comment Wears eye glasses. -  Difficulty concentrating or making decisions? N N  Walking or climbing stairs? N N  Dressing or bathing? N N  Doing errands, shopping? N N  Preparing Food and eating ? N -  Using the Toilet? N -  In the past six months, have you accidently leaked urine? Y -  Comment  Occasionally -  Do you have problems with loss of bowel control? N -  Managing your Medications? N -  Managing your Finances? N -  Housekeeping or managing your Housekeeping? N -    Patient Care Team: Jerrol Banana., MD as PCP - General (Family Medicine) [place], [place] [facility])  Indicate any recent Medical Services you may have received from other than [facility] providers in the past year (date may be approximate).     Assessment:   This is a routine wellness examination for [place].  Hearing/Vision screen  Hearing Screening   125Hz  250Hz  500Hz  1000Hz  2000Hz  3000Hz  4000Hz  6000Hz  8000Hz   Right ear:           Left ear:           Comments: No hearing complaints.  Vision Screening Comments: Has yearly eye exams at [facility].  Dietary issues and exercise activities discussed: Current Exercise Habits: The patient does not participate in regular exercise at present, Exercise limited by: None identified  Goals    . Exercise 3x per week (30 min per time)     Recommend to start walking 3 days a week for at least 30 minutes at a  time.       Depression Screen PHQ 2/9 Scores 08/01/2019 02/16/2019 10/24/2018  PHQ - 2 Score 0 0 0  PHQ- 9 Score - 0 0    Fall Risk Fall Risk  08/01/2019 02/16/2019 10/24/2018  Falls in the past year? 0 0 0  Number falls in past yr: 0 0 0  Injury with Fall? 0 0 0  Follow up - Falls evaluation completed -    Any stairs in or around the home? No  If so, are there any without handrails? N/A Home free of loose throw rugs in walkways, pet beds, electrical cords, etc? Yes  Adequate lighting in your home to reduce risk of falls? Yes   ASSISTIVE DEVICES UTILIZED TO PREVENT FALLS:  Life alert? No  Use of a cane, walker or w/c? No  Grab bars in the bathroom? No  Shower chair or bench in shower? Yes  Elevated toilet seat or a handicapped toilet? No   Cognitive Function: De clined today.      6CIT Screen 02/16/2019  What Year? 0 points  What month? 0 points  What time? 0 points  Count back from 20 2 points  Months in reverse 0 points  Repeat phrase 2 points  Total Score 4    Immunizations Immunization History  Administered Date(s) Administered  . Influenza Split 11/18/2009, 11/14/2010, 11/16/2011, 12/02/2012, 11/24/2013, 12/04/2014, 11/15/2017  . Influenza, High Dose Seasonal PF 12/27/2015, 11/10/2016, 11/15/2017, 11/25/2017, 10/24/2018  . Influenza, Seasonal, Injecte, Preservative Fre 11/16/2011, 12/02/2012, 11/24/2013  . Influenza,inj,Quad PF,6+ Mos 12/05/2014  . Influenza,inj,quad, With Preservative 12/05/2014  . PFIZER SARS-COV-2 Vaccination 02/15/2019, 03/11/2019  . Pneumococcal Conjugate-13 09/14/2013  . Pneumococcal Polysaccharide-23 03/03/2007  . Tdap 05/01/2011  . Tetanus 10/19/1985, 03/03/2007  . Zoster 03/08/2007    TDAP status: Up to date Flu Vaccine status: Up to date Pneumococcal vaccine status: Up to date Covid-19 vaccine status: Completed vaccines  Qualifies for Shingles Vaccine? Yes   Zostavax completed 03/08/07  Shingrix Completed?: No.    Education has  been provided regarding the importance of this vaccine. Patient has been advised to call insurance company to determine out of pocket expense if they have not yet received this vaccine. Advised may also receive vaccine at local pharmacy or Health Dept. Verbalized acceptance  and understanding.  Screening Tests Health Maintenance  Topic Date Due  . INFLUENZA VACCINE  09/10/2019  . TETANUS/TDAP  04/30/2021  . COVID-19 Vaccine  Completed  . PNA vac Low Risk Adult  Completed    Health Maintenance  There are no preventive care reminders to display for this patient.  Colorectal cancer screening: No longer required.   Lung Cancer Screening: (Low Dose CT Chest recommended if Age 43-80 years, 30 pack-year currently smoking OR have quit w/in 15years.) does not qualify.   Additional Screening:  Vision Screening: Recommended annual ophthalmology exams for early detection of glaucoma and other disorders of the eye. Is the patient up to date with their annual eye exam?  Yes  Who is the provider or what is the name of the office in which the patient attends annual eye exams? [facility] If pt is not established with a provider, would they like to be referred to a provider to establish care? No .   Dental Screening: Recommended annual dental exams for proper oral hygiene  Community Resource Referral / Chronic Care Management: CRR required this visit?  No   CCM required this visit?  No      Plan:    I have personally reviewed and noted the following in the patient's chart:   . Medical and social history . Use of alcohol, tobacco or illicit drugs  . Current medications and supplements . Functional ability and status . Nutritional status . Physical activity . Advanced directives . List of other physicians . Hospitalizations, surgeries, and ER visits in previous 12 months . Vitals . Screenings to include cognitive, depression, and falls . Referrals and appointments  In addition, I have  reviewed and discussed with patient certain preventive protocols, quality metrics, and best practice recommendations. A written personalized care plan for preventive services as well as general preventive health recommendations were provided to patient.     Rheanna Sergent [place], [place]   [phone]   Nurse Notes: None.

## 2019-08-01 ENCOUNTER — Telehealth: Payer: Self-pay

## 2019-08-01 ENCOUNTER — Ambulatory Visit (INDEPENDENT_AMBULATORY_CARE_PROVIDER_SITE_OTHER): Payer: Medicare Other

## 2019-08-01 ENCOUNTER — Other Ambulatory Visit: Payer: Self-pay

## 2019-08-01 DIAGNOSIS — Z0000 Encounter for general adult medical examination without abnormal findings: Secondary | ICD-10-CM | POA: Diagnosis not present

## 2019-08-01 NOTE — Telephone Encounter (Signed)
Patient has been given lab results through mychart and has read the provider's comments.

## 2019-08-01 NOTE — Patient Instructions (Addendum)
Jacob Montes , Thank you for taking time to come for your Medicare Wellness Visit. I appreciate your ongoing commitment to your health goals. Please review the following plan we discussed and let me know if I can assist you in the future.   Screening recommendations/referrals: Colonoscopy: No longer required.  Recommended yearly ophthalmology/optometry visit for glaucoma screening and checkup Recommended yearly dental visit for hygiene and checkup  Vaccinations: Influenza vaccine: Done 10/24/18 Pneumococcal vaccine: Completed series Tdap vaccine: Up to date, due 04/2021 Shingles vaccine: Currently due, declined today.     Advanced directives: Please bring a copy of your POA (Power of Attorney) and/or Living Will to your next appointment.   Conditions/risks identified: Recommend to start walking 3 days a week for at least 30 minutes at a time.   Next appointment: 09/27/19 @ 8:40 with Dr Rosanna Randy. Declined scheduling an AWV for 2022 at this time.   AVS on MyChart.  Preventive Care 79 Years and Older, Male Preventive care refers to lifestyle choices and visits with your health care provider that can promote health and wellness. What does preventive care include?  A yearly physical exam. This is also called an annual well check.  Dental exams once or twice a year.  Routine eye exams. Ask your health care provider how often you should have your eyes checked.  Personal lifestyle choices, including:  Daily care of your teeth and gums.  Regular physical activity.  Eating a healthy diet.  Avoiding tobacco and drug use.  Limiting alcohol use.  Practicing safe sex.  Taking low doses of aspirin every day.  Taking vitamin and mineral supplements as recommended by your health care provider. What happens during an annual well check? The services and screenings done by your health care provider during your annual well check will depend on your age, overall health, lifestyle risk factors,  and family history of disease. Counseling  Your health care provider may ask you questions about your:  Alcohol use.  Tobacco use.  Drug use.  Emotional well-being.  Home and relationship well-being.  Sexual activity.  Eating habits.  History of falls.  Memory and ability to understand (cognition).  Work and work Statistician. Screening  You may have the following tests or measurements:  Height, weight, and BMI.  Blood pressure.  Lipid and cholesterol levels. These may be checked every 5 years, or more frequently if you are over 41 years old.  Skin check.  Lung cancer screening. You may have this screening every year starting at age 55 if you have a 30-pack-year history of smoking and currently smoke or have quit within the past 15 years.  Fecal occult blood test (FOBT) of the stool. You may have this test every year starting at age 55.  Flexible sigmoidoscopy or colonoscopy. You may have a sigmoidoscopy every 5 years or a colonoscopy every 10 years starting at age 35.  Prostate cancer screening. Recommendations will vary depending on your family history and other risks.  Hepatitis C blood test.  Hepatitis B blood test.  Sexually transmitted disease (STD) testing.  Diabetes screening. This is done by checking your blood sugar (glucose) after you have not eaten for a while (fasting). You may have this done every 1-3 years.  Abdominal aortic aneurysm (AAA) screening. You may need this if you are a current or former smoker.  Osteoporosis. You may be screened starting at age 66 if you are at high risk. Talk with your health care provider about your test results, treatment  options, and if necessary, the need for more tests. Vaccines  Your health care provider may recommend certain vaccines, such as:  Influenza vaccine. This is recommended every year.  Tetanus, diphtheria, and acellular pertussis (Tdap, Td) vaccine. You may need a Td booster every 10  years.  Zoster vaccine. You may need this after age 61.  Pneumococcal 13-valent conjugate (PCV13) vaccine. One dose is recommended after age 54.  Pneumococcal polysaccharide (PPSV23) vaccine. One dose is recommended after age 1. Talk to your health care provider about which screenings and vaccines you need and how often you need them. This information is not intended to replace advice given to you by your health care provider. Make sure you discuss any questions you have with your health care provider. Document Released: 02/22/2015 Document Revised: 10/16/2015 Document Reviewed: 11/27/2014 Elsevier Interactive Patient Education  2017 [place] Prevention in the Home Falls can cause injuries. They can happen to people of all ages. There are many things you can do to make your home safe and to help prevent falls. What can I do on the outside of my home?  Regularly fix the edges of walkways and driveways and fix any cracks.  Remove anything that might make you trip as you walk through a door, such as a raised step or threshold.  Trim any bushes or trees on the path to your home.  Use bright outdoor lighting.  Clear any walking paths of anything that might make someone trip, such as rocks or tools.  Regularly check to see if handrails are loose or broken. Make sure that both sides of any steps have handrails.  Any raised decks and porches should have guardrails on the edges.  Have any leaves, snow, or ice cleared regularly.  Use sand or salt on walking paths during winter.  Clean up any spills in your garage right away. This includes oil or grease spills. What can I do in the bathroom?  Use night lights.  Install grab bars by the toilet and in the tub and shower. Do not use towel bars as grab bars.  Use non-skid mats or decals in the tub or shower.  If you need to sit down in the shower, use a plastic, non-slip stool.  Keep the floor dry. Clean up any water that  spills on the floor as soon as it happens.  Remove soap buildup in the tub or shower regularly.  Attach bath mats securely with double-sided non-slip rug tape.  Do not have throw rugs and other things on the floor that can make you trip. What can I do in the bedroom?  Use night lights.  Make sure that you have a light by your bed that is easy to reach.  Do not use any sheets or blankets that are too big for your bed. They should not hang down onto the floor.  Have a firm chair that has side arms. You can use this for support while you get dressed.  Do not have throw rugs and other things on the floor that can make you trip. What can I do in the kitchen?  Clean up any spills right away.  Avoid walking on wet floors.  Keep items that you use a lot in easy-to-reach places.  If you need to reach something above you, use a strong step stool that has a grab bar.  Keep electrical cords out of the way.  Do not use floor polish or wax that makes floors slippery.  If you must use wax, use non-skid floor wax.  Do not have throw rugs and other things on the floor that can make you trip. What can I do with my stairs?  Do not leave any items on the stairs.  Make sure that there are handrails on both sides of the stairs and use them. Fix handrails that are broken or loose. Make sure that handrails are as long as the stairways.  Check any carpeting to make sure that it is firmly attached to the stairs. Fix any carpet that is loose or worn.  Avoid having throw rugs at the top or bottom of the stairs. If you do have throw rugs, attach them to the floor with carpet tape.  Make sure that you have a light switch at the top of the stairs and the bottom of the stairs. If you do not have them, ask someone to add them for you. What else can I do to help prevent falls?  Wear shoes that:  Do not have high heels.  Have rubber bottoms.  Are comfortable and fit you well.  Are closed at the  toe. Do not wear sandals.  If you use a stepladder:  Make sure that it is fully opened. Do not climb a closed stepladder.  Make sure that both sides of the stepladder are locked into place.  Ask someone to hold it for you, if possible.  Clearly mark and make sure that you can see:  Any grab bars or handrails.  First and last steps.  Where the edge of each step is.  Use tools that help you move around (mobility aids) if they are needed. These include:  Canes.  Walkers.  Scooters.  Crutches.  Turn on the lights when you go into a dark area. Replace any light bulbs as soon as they burn out.  Set up your furniture so you have a clear path. Avoid moving your furniture around.  If any of your floors are uneven, fix them.  If there are any pets around you, be aware of where they are.  Review your medicines with your doctor. Some medicines can make you feel dizzy. This can increase your chance of falling. Ask your doctor what other things that you can do to help prevent falls. This information is not intended to replace advice given to you by your health care provider. Make sure you discuss any questions you have with your health care provider. Document Released: 11/22/2008 Document Revised: 07/04/2015 Document Reviewed: 03/02/2014 Elsevier Interactive Patient Education  2017 [employer].

## 2019-08-01 NOTE — Telephone Encounter (Signed)
-----   Message from Jerrol Banana., MD sent at 08/01/2019  9:00 AM EDT ----- Labs stable.

## 2019-09-25 NOTE — Progress Notes (Signed)
Trena Platt Cummings,acting as a scribe for [name], MD.,have documented all relevant documentation on the behalf of [name], MD,as directed by  [name], MD while in the presence of [name], MD.  Established patient visit   Patient: Jacob Montes   DOB: [birth year]   79 y.o. Male  MRN: [id number] Visit Date: 09/27/2019  Today's healthcare provider: [name], MD   Chief Complaint  Patient presents with  . Hypertension  . Nocturia   Subjective    HPI  Patient feels well and has no complaints. Hypertension, follow-up  BP Readings from Last 3 Encounters:  09/27/19 133/66  07/27/19 (!) 129/52  05/22/19 (!) 158/63   Wt Readings from Last 3 Encounters:  09/27/19 246 lb (111.6 kg)  07/27/19 243 lb (110.2 kg)  05/22/19 239 lb (108.4 kg)     He was last seen for hypertension 2 months ago.  BP at that visit was 129/52. Management since that visit includes; Well controlled. Continue current medications.  He reports good compliance with treatment. He is not having side effects.  He is not exercising. He is adherent to low salt diet.   Outside blood pressures are 120/61 approx.  He does not smoke.  Use of agents associated with hypertension: none.   ---------------------------------------------------------------------------------------------------  Nocturia From 07/27/2019-Take Ibuprofen 200 mg at bedtime .  My guess is that he is having pain when he rolls over at night and this wakes him up and he feels the need to void.  When he is not awakened by the pain because of the nonsteroidal and he sleeps through the night.  We will try this for a couple of months and see him back.      Medications: Outpatient Medications Prior to Visit  Medication Sig  . amLODipine (NORVASC) 10 MG tablet Take 1 tablet (10 mg total) by mouth daily.  Marland Kitchen aspirin 81 MG chewable tablet Chew 81 mg by mouth daily.  Marland Kitchen docusate sodium (COLACE) 250 MG  capsule Take 250 mg by mouth daily as needed for constipation.   . metoprolol tartrate (LOPRESSOR) 25 MG tablet Take 1 1/2 tablet by mouth twice a day.  . montelukast (SINGULAIR) 10 MG tablet Take 1 tablet (10 mg total) by mouth at bedtime as needed (allergies).  . Multiple Vitamin (MULTIVITAMIN) tablet Take 1 tablet by mouth daily.  Marland Kitchen omeprazole (PRILOSEC) 20 MG capsule Take 20 mg by mouth daily. As needed  . rosuvastatin (CRESTOR) 20 MG tablet Take 1 tablet (20 mg total) by mouth daily.  . Vitamins/Minerals TABS Take by mouth.    No facility-administered medications prior to visit.    Review of Systems  Constitutional: Negative for appetite change, chills and fever.  Respiratory: Negative for chest tightness, shortness of breath and wheezing.   Cardiovascular: Negative for chest pain and palpitations.  Gastrointestinal: Negative for abdominal pain, nausea and vomiting.       Objective    BP 133/66 (BP Location: Right Arm, Patient Position: Sitting, Cuff Size: Large)   Pulse (!) 49   Temp 98.3 F (36.8 C) (Temporal)   Ht 5\' 6"  (1.676 m)   Wt 246 lb (111.6 kg)   BMI 39.71 kg/m  BP Readings from Last 3 Encounters:  09/27/19 133/66  07/27/19 (!) 129/52  05/22/19 (!) 158/63   Wt Readings from Last 3 Encounters:  09/27/19 246 lb (111.6 kg)  07/27/19 243 lb (110.2 kg)  05/22/19 239 lb (108.4 kg)  Physical Exam Vitals reviewed.  Constitutional:      Appearance: Normal appearance.  HENT:     Head: Normocephalic and atraumatic.     Right Ear: External ear normal.     Left Ear: External ear normal.  Eyes:     General: No scleral icterus.    Conjunctiva/sclera: Conjunctivae normal.  Cardiovascular:     Rate and Rhythm: Normal rate and regular rhythm.     Pulses: Normal pulses.     Heart sounds: Normal heart sounds.     Comments: No carotid bruit Pulmonary:     Effort: Pulmonary effort is normal.     Breath sounds: Normal breath sounds.  Musculoskeletal:      Right lower leg: No edema.     Left lower leg: No edema.  Skin:    General: Skin is warm and dry.  Neurological:     General: No focal deficit present.     Mental Status: He is alert and oriented to person, place, and time.  Psychiatric:        Mood and Affect: Mood normal.        Behavior: Behavior normal.        Thought Content: Thought content normal.        Judgment: Judgment normal.      General: Appearance:    Obese male in no acute distress  Eyes:    PERRL, conjunctiva/corneas clear, EOM's intact       Lungs:     Clear to auscultation bilaterally, respirations unlabored  Heart:    Bradycardic. Normal rhythm.    MS:   All extremities are intact.   Neurologic:   Awake, alert, oriented x 3. No apparent focal neurological           defect.         No results found for any visits on 09/27/19.  Assessment & Plan     1. Benign essential hypertension Good control on amlodipine metoprolol. - CBC with Differential/Platelet - Lipid panel - Comprehensive metabolic panel - TSH - Hemoglobin A1c  2. Mixed hyperlipidemia Controlled on Crestor 20 mg daily - CBC with Differential/Platelet - Lipid panel - Comprehensive metabolic panel - TSH - Hemoglobin A1c  3. Hyperglycemia Continue to work on diet and exercise - CBC with Differential/Platelet - Lipid panel - Comprehensive metabolic panel - TSH - Hemoglobin A1c  4. Carotid stenosis, bilateral Follow-up carotid Dopplers - US Carotid Duplex Bilateral - CBC with Differential/Platelet - Lipid panel - Comprehensive metabolic panel - TSH - Hemoglobin A1c 5.  Knee pain/osteoarthritis  Return in about 6 months (around 03/29/2020).         Abbrielle Batts Cranford Mon, MD  [facility] [phone] (phone) [phone] (fax)  [place]

## 2019-09-27 ENCOUNTER — Other Ambulatory Visit: Payer: Self-pay

## 2019-09-27 ENCOUNTER — Ambulatory Visit (INDEPENDENT_AMBULATORY_CARE_PROVIDER_SITE_OTHER): Payer: Medicare Other | Admitting: Family Medicine

## 2019-09-27 ENCOUNTER — Encounter: Payer: Self-pay | Admitting: Family Medicine

## 2019-09-27 VITALS — BP 133/66 | HR 49 | Temp 98.3°F | Ht 66.0 in | Wt 246.0 lb

## 2019-09-27 DIAGNOSIS — R739 Hyperglycemia, unspecified: Secondary | ICD-10-CM

## 2019-09-27 DIAGNOSIS — R351 Nocturia: Secondary | ICD-10-CM

## 2019-09-27 DIAGNOSIS — I2510 Atherosclerotic heart disease of native coronary artery without angina pectoris: Secondary | ICD-10-CM | POA: Diagnosis not present

## 2019-09-27 DIAGNOSIS — E782 Mixed hyperlipidemia: Secondary | ICD-10-CM | POA: Diagnosis not present

## 2019-09-27 DIAGNOSIS — I10 Essential (primary) hypertension: Secondary | ICD-10-CM

## 2019-09-27 DIAGNOSIS — I6523 Occlusion and stenosis of bilateral carotid arteries: Secondary | ICD-10-CM

## 2019-09-27 DIAGNOSIS — M25569 Pain in unspecified knee: Secondary | ICD-10-CM

## 2019-09-27 NOTE — Patient Instructions (Signed)
CALL BACK TO BE SET UP FOR FLU CLINIC!!! [phone]

## 2019-09-28 ENCOUNTER — Telehealth: Payer: Self-pay

## 2019-09-28 LAB — COMPREHENSIVE METABOLIC PANEL
ALT: 31 IU/L (ref 0–44)
AST: 29 IU/L (ref 0–40)
Albumin/Globulin Ratio: 2.4 — ABNORMAL HIGH (ref 1.2–2.2)
Albumin: 4.3 g/dL (ref 3.7–4.7)
Alkaline Phosphatase: 43 IU/L — ABNORMAL LOW (ref 48–121)
BUN/Creatinine Ratio: 29 — ABNORMAL HIGH (ref 10–24)
BUN: 19 mg/dL (ref 8–27)
Bilirubin Total: 1.1 mg/dL (ref 0.0–1.2)
CO2: 23 mmol/L (ref 20–29)
Calcium: 9.1 mg/dL (ref 8.6–10.2)
Chloride: 105 mmol/L (ref 96–106)
Creatinine, Ser: 0.65 mg/dL — ABNORMAL LOW (ref 0.76–1.27)
GFR calc Af Amer: 108 mL/min/{1.73_m2} (ref >59)
GFR calc non Af Amer: 93 mL/min/{1.73_m2} (ref >59)
Globulin, Total: 1.8 g/dL (ref 1.5–4.5)
Glucose: 93 mg/dL (ref 65–99)
Potassium: 4.5 mmol/L (ref 3.5–5.2)
Sodium: 141 mmol/L (ref 134–144)
Total Protein: 6.1 g/dL (ref 6.0–8.5)

## 2019-09-28 LAB — CBC WITH DIFFERENTIAL/PLATELET
Basophils Absolute: 0 10*3/uL (ref 0.0–0.2)
Basos: 1 %
EOS (ABSOLUTE): 0.1 10*3/uL (ref 0.0–0.4)
Eos: 2 %
Hematocrit: 39.6 % (ref 37.5–51.0)
Hemoglobin: 14 g/dL (ref 13.0–17.7)
Immature Grans (Abs): 0 10*3/uL (ref 0.0–0.1)
Immature Granulocytes: 0 %
Lymphocytes Absolute: 2.2 10*3/uL (ref 0.7–3.1)
Lymphs: 36 %
MCH: 33.6 pg — ABNORMAL HIGH (ref 26.6–33.0)
MCHC: 35.4 g/dL (ref 31.5–35.7)
MCV: 95 fL (ref 79–97)
Monocytes Absolute: 0.6 10*3/uL (ref 0.1–0.9)
Monocytes: 10 %
Neutrophils Absolute: 3.1 10*3/uL (ref 1.4–7.0)
Neutrophils: 51 %
Platelets: 103 10*3/uL — ABNORMAL LOW (ref 150–450)
RBC: 4.17 x10E6/uL (ref 4.14–5.80)
RDW: 13 % (ref 11.6–15.4)
WBC: 6 10*3/uL (ref 3.4–10.8)

## 2019-09-28 LAB — TSH: TSH: 0.689 u[IU]/mL (ref 0.450–4.500)

## 2019-09-28 LAB — LIPID PANEL
Chol/HDL Ratio: 3.1 ratio (ref 0.0–5.0)
Cholesterol, Total: 133 mg/dL (ref 100–199)
HDL: 43 mg/dL (ref >39)
LDL Chol Calc (NIH): 73 mg/dL (ref 0–99)
Triglycerides: 91 mg/dL (ref 0–149)
VLDL Cholesterol Cal: 17 mg/dL (ref 5–40)

## 2019-09-28 LAB — HEMOGLOBIN A1C
Est. average glucose Bld gHb Est-mCnc: 114 mg/dL
Hgb A1c MFr Bld: 5.6 % (ref 4.8–5.6)

## 2019-09-28 NOTE — Telephone Encounter (Signed)
-----   Message from Jerrol Banana., MD sent at 09/28/2019 11:41 AM EDT ----- Labs in normal range.

## 2019-09-28 NOTE — Telephone Encounter (Signed)
Patient advised of lab results via mychart and has read the providers comments.

## 2019-10-20 ENCOUNTER — Other Ambulatory Visit: Payer: Medicare Other

## 2019-10-24 ENCOUNTER — Ambulatory Visit
Admission: RE | Admit: 2019-10-24 | Discharge: 2019-10-24 | Disposition: A | Discharge status: Home / Self Care | Payer: Medicare Other | Source: Ambulatory Visit | Attending: Family Medicine | Admitting: Family Medicine

## 2019-10-24 ENCOUNTER — Encounter: Payer: Self-pay | Admitting: Family Medicine

## 2019-10-24 ENCOUNTER — Other Ambulatory Visit: Payer: Self-pay

## 2019-10-24 DIAGNOSIS — I6523 Occlusion and stenosis of bilateral carotid arteries: Secondary | ICD-10-CM | POA: Diagnosis not present

## 2019-10-30 ENCOUNTER — Telehealth: Payer: Self-pay

## 2019-10-30 NOTE — Telephone Encounter (Signed)
Patient advised of doppler results via mychart and has read the providers comments.

## 2019-10-30 NOTE — Telephone Encounter (Signed)
-----   Message from Jerrol Banana., MD sent at 10/30/2019  2:45 PM EDT ----- Carotid Dopplers okay.

## 2020-02-21 ENCOUNTER — Other Ambulatory Visit: Payer: Self-pay

## 2020-02-21 ENCOUNTER — Ambulatory Visit: Payer: Medicare Other | Admitting: Cardiovascular Disease

## 2020-02-21 ENCOUNTER — Ambulatory Visit (INDEPENDENT_AMBULATORY_CARE_PROVIDER_SITE_OTHER): Payer: Medicare Other

## 2020-02-21 ENCOUNTER — Encounter: Payer: Self-pay | Admitting: Cardiovascular Disease

## 2020-02-21 VITALS — BP 134/64 | HR 51 | Ht 66.0 in | Wt 243.0 lb

## 2020-02-21 DIAGNOSIS — I479 Paroxysmal tachycardia, unspecified: Secondary | ICD-10-CM

## 2020-02-21 DIAGNOSIS — I350 Nonrheumatic aortic (valve) stenosis: Secondary | ICD-10-CM

## 2020-02-21 DIAGNOSIS — I2510 Atherosclerotic heart disease of native coronary artery without angina pectoris: Secondary | ICD-10-CM

## 2020-02-21 DIAGNOSIS — Z952 Presence of prosthetic heart valve: Secondary | ICD-10-CM

## 2020-02-21 DIAGNOSIS — I25118 Atherosclerotic heart disease of native coronary artery with other forms of angina pectoris: Secondary | ICD-10-CM | POA: Diagnosis not present

## 2020-02-21 MED ORDER — FUROSEMIDE 40 MG PO TABS: 40.0000 mg | ORAL_TABLET | Freq: Every day | ORAL | 3 refills | Status: DC | PRN

## 2020-02-21 NOTE — Patient Instructions (Addendum)
Medication Instructions:  Please continue your current medications Lasix 40 mg daily as needed for swelling  Lab work: None   Testing/Procedures: Heart monitor (Zio patch) for 2 weeks (14 days) for paroxysmal tachycardia  Your physician has recommended that you wear a Zio monitor. This monitor is a medical device that records the heart's electrical activity. Doctors most often use these monitors to diagnose arrhythmias. Arrhythmias are problems with the speed or rhythm of the heartbeat. The monitor is a small device applied to your chest. You can wear one while you do your normal daily activities. While wearing this monitor if you have any symptoms to push the button and record what you felt. Once you have worn this monitor for the period of time provider prescribed (Usually 14 days), you will return the monitor device in the postage paid box. Once it is returned they will download the data collected and provide [place] with a report which the provider will then review and we will call you with those results. Important tips:  1. Avoid showering during the first 24 hours of wearing the monitor. 2. Avoid excessive sweating to help maximize wear time. 3. Do not submerge the device, no hot tubs, and no swimming pools. 4. Keep any lotions or oils away from the patch. 5. After 24 hours you may shower with the patch on. Take brief showers with your back facing the shower head.  6. Do not remove patch once it has been placed because that will interrupt data and decrease adhesive wear time. 7. Push the button when you have any symptoms and write down what you were feeling. 8. Once you have completed wearing your monitor, remove and place into box which has postage paid and place in your outgoing mailbox.  9. If for some reason you have misplaced your box then call our office and we can provide another box and/or mail it off for you.        Follow-Up: At [facility], you and your health needs are  our priority.  As part of our continuing mission to provide you with exceptional heart care, we have created designated Provider Care Teams.  These Care Teams include your primary Cardiologist (physician) and Advanced Practice Providers (APPs -  Physician Assistants and Nurse Practitioners) who all work together to provide you with the care you need, when you need it.  . You will need a follow up appointment in 6 months  . Providers on your designated Care Team:   . Murray Hodgkins, NP . Christell Faith, PA-C . Marrianne Mood, PA-C   COVID-19 Vaccine Information can be found at: [email or web address] For questions related to vaccine distribution or appointments, please email [email or web address]@ [email or web address] or call [phone].

## 2020-02-21 NOTE — Progress Notes (Signed)
Cardiology Office Note  Date:  02/21/2020   ID:  Jacob Montes, DOB 30-Oct-1940, MRN [id number]  PCP:  Jerrol Banana., MD   Chief Complaint  Patient presents with  . New Patient (Initial Visit)    Pt c/o swelling in both legs; would like to establish care    HPI:  [name] is a 80 year old gentleman with coronary artery disease, bypass surgery three-vessel June 2009,  aortic valve stenosis with TAVR July 2018,  carotid stenosis with carotid endarterectomy 2009,  DVT/PE 2012,  prior smoker,  sleep apnea, not on CPAP Seen in the hospital October 2020  with abdominal pain, Rigors, chills fever  Common bile duct stone 8 mm with transaminitis , had ERCP Demand ischemia in the setting of above Who presents to establish care in the [place] office for his coronary disease, aortic valve prosthesis/TAVR  Prior cardiac records reviewed TEE October 2020, Ejection fraction 60 to 65%, well-functioning aortic valve prosthesis trivial regurg  Reviewed current medications Takes lasix 40 mg as needed 4x a month for swelling  Presents with his wife on today's visit He is sedentary, previously very active with a on the house was busy outside doing gardening etc. Now in a townhouse, no activities Looking for things for him to do  Some intolerance to statins though tolerating Crestor  Last heart catheterization November 2018 Records requested  They have several sheets of blood pressures heart rates Tachycardia spell 12/23/2019, heart rate 120 bpm Felt pounding at 11 pm Went away without intervention, did not last very long Has had other episodes, possibly 1 last week  Lab work reviewed HBA1C 5.6 Total chol 133  EKG personally reviewed by myself on todays visit Sinus bradycardia rate 51 bpm nonspecific intraventricular conduction delay   PMH:   has a past medical history of Aortic stenosis, CAD (coronary artery disease), Carotid artery disease ([place]), Cataract, DVT  (deep venous thrombosis) ([place]) (08/2010), 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  PSH:    Past Surgical History:  Procedure Laterality Date  . Carotid surgery Left 03/08/2007  . CHOLECYSTECTOMY  02/16/2018  . ERCP N/A 11/18/2018   Procedure: ENDOSCOPIC RETROGRADE CHOLANGIOPANCREATOGRAPHY (ERCP);  Surgeon: [name], MD;  Location: [facility] ENDOSCOPY;  Service: Endoscopy;  Laterality: N/A;  . Heart Bypass N/A 07/2007  . left shoulder surgery Left 06/03/2009  . RIGHT AND LEFT HEART CATH  07/18/2007  . right shoulder surgery  2003  . TEE WITHOUT CARDIOVERSION N/A 11/21/2018   Procedure: TRANSESOPHAGEAL ECHOCARDIOGRAM (TEE);  Surgeon: Wellington Hampshire, MD;  Location: [facility] ORS;  Service: Cardiovascular;  Laterality: N/A;  Lonia Farber      Current Outpatient Medications  Medication Sig Dispense Refill  . amLODipine (NORVASC) 10 MG tablet Take 1 tablet (10 mg total) by mouth daily. 90 tablet 3  . aspirin 81 MG chewable tablet Chew 81 mg by mouth daily.    Marland Kitchen docusate sodium (COLACE) 250 MG capsule Take 250 mg by mouth daily as needed for constipation.     . furosemide (LASIX) 40 MG tablet Take 1 tablet (40 mg total) by mouth daily as needed. 90 tablet 3  . metoprolol tartrate (LOPRESSOR) 25 MG tablet Take 1 1/2 tablet by mouth twice a day. 270 tablet 3  . montelukast (SINGULAIR) 10 MG tablet Take 1 tablet (10 mg total) by mouth at bedtime as needed (allergies). 30 tablet 3  .  Multiple Vitamin (MULTIVITAMIN) tablet Take 1 tablet by mouth daily.    Marland Kitchen omeprazole (PRILOSEC) 20 MG capsule Take 20 mg by mouth daily. As needed    . rosuvastatin (CRESTOR) 20 MG tablet Take 1 tablet (20 mg total) by mouth daily. 90 tablet 3  . Vitamins/Minerals TABS Take by mouth.      No current  facility-administered medications for this visit.     Allergies:   Atorvastatin and Ramipril   Social History:  The patient  reports that he has quit smoking. His smoking use included cigarettes. He has never used smokeless tobacco. He reports that he does not drink alcohol and does not use drugs.   Family History:   family history includes Hypertension in his mother; Lung cancer in his father; Non-Hodgkin's lymphoma in his brother; Stroke in his mother.    Review of Systems: Review of Systems  Constitutional: Negative.   HENT: Negative.   Respiratory: Negative.   Cardiovascular: Negative.   Gastrointestinal: Negative.   Musculoskeletal: Negative.   Neurological: Negative.   Psychiatric/Behavioral: Negative.   All other systems reviewed and are negative.   PHYSICAL EXAM: VS:  BP 134/64   Pulse (!) 51   Ht 5\' 6"  (1.676 m)   Wt 243 lb (110.2 kg)   BMI 39.22 kg/m  , BMI Body mass index is 39.22 kg/m. GEN: Well nourished, well developed, in no acute distress HEENT: normal Neck: no JVD, carotid bruits, or masses Cardiac: RRR; no murmurs, rubs, or gallops,no edema  Respiratory:  clear to auscultation bilaterally, normal work of breathing GI: soft, nontender, nondistended, + BS MS: no deformity or atrophy Skin: warm and dry, no rash Neuro:  Strength and sensation are intact Psych: euthymic mood, full affect   Recent Labs: 09/27/2019: ALT 31; BUN 19; Creatinine, Ser 0.65; Hemoglobin 14.0; Platelets 103; Potassium 4.5; Sodium 141; TSH 0.689    Lipid Panel Lab Results  Component Value Date   CHOL 133 09/27/2019   HDL 43 09/27/2019   LDLCALC 73 09/27/2019   TRIG 91 09/27/2019      Wt Readings from Last 3 Encounters:  02/21/20 243 lb (110.2 kg)  09/27/19 246 lb (111.6 kg)  07/27/19 243 lb (110.2 kg)       ASSESSMENT AND PLAN:  Problem List Items Addressed This Visit      Cardiology Problems   Aortic stenosis   Relevant Medications   furosemide (LASIX) 40 MG  tablet   Other Relevant Orders   EKG 12-Lead   Arteriosclerotic coronary artery disease   Relevant Medications   furosemide (LASIX) 40 MG tablet    Other Visit Diagnoses    Coronary artery disease of native artery of native heart with stable angina pectoris ([place])    -  Primary   Relevant Medications   furosemide (LASIX) 40 MG tablet   Other Relevant Orders   EKG 12-Lead   Paroxysmal tachycardia (HCC)       Relevant Medications   furosemide (LASIX) 40 MG tablet   Other Relevant Orders   LONG TERM MONITOR (3-14 DAYS)   History of transcatheter aortic valve replacement (TAVR)         Coronary artery disease with stable angina Denies anginal symptoms Cholesterol at goal  EKG unchanged  Paroxysmal tachycardia Etiology unclear, unable to exclude arrhythmia such as atrial fibrillation, flutter, even VT Discussed various treatment options with him, we will place a ZIO monitor for further evaluation  Aortic valve disease, stenosis S/p TAVR Recent echocardiogram and  TEE Valve well-seated, no significant regurgitation We will plan a repeat echocardiogram in 1 year No significant murmur on today's visit  Hyperlipidemia On Crestor 20 daily Lifestyle modifications recommended Could consider adding Zetia to achieve goal LDL 60 or less We will discuss in follow-up   Total encounter time more than 60 minutes  Greater than 50% was spent in counseling and coordination of care with the patient    Signed, Esmond Plants, M.D., Ph.D. [place], [place]

## 2020-02-22 ENCOUNTER — Encounter: Payer: Self-pay | Admitting: Family Medicine

## 2020-02-22 NOTE — Progress Notes (Deleted)
Complete physical exam   Patient: Jacob Montes   DOB: 1940-05-16   80 y.o. Male  MRN: [id number] Visit Date: 02/22/2020  Today's healthcare provider: [name], MD   No chief complaint on file.  Subjective    Jacob Montes is a 80 y.o. male who presents today for a complete physical exam.  He reports consuming a {diet types:17450} diet. {Exercise:19826} He generally feels {well/fairly well/poorly:18703}. He reports sleeping {well/fairly well/poorly:18703}. He {does/does not:200015} have additional problems to discuss today.   Had AWV on 08/01/2019 with [facility].  HPI  ***  Past Medical History:  Diagnosis Date  . Aortic stenosis    a. s/p TAVR 03/2017  . CAD (coronary artery disease)    a. s/p 3-v CABG in 07/2007  . Carotid artery disease ([place])    a. s/p left sided CEA complicated by RLN parlaysis   . Cataract   . DVT (deep venous thrombosis) ([place]) 08/2010  . 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 GERD (gastroesophageal reflux disease)   . HLD (hyperlipidemia)   . Hyperlipidemia 10/15/2015  . Hypertension   . Nephrolithiasis 11/16/2018  . Pulmonary embolism ([place]) 08/2010  . Skin cancer   . Sleep apnea   . Stroke [facility])    Past Surgical History:  Procedure Laterality Date  . Carotid surgery Left 03/08/2007  . CHOLECYSTECTOMY  02/16/2018  . ERCP N/A 11/18/2018   Procedure: ENDOSCOPIC RETROGRADE CHOLANGIOPANCREATOGRAPHY (ERCP);  Surgeon: [name], MD;  Location[facility] ENDOSCOPY;  Service: Endoscopy;  Laterality: N/A;  . Heart Bypass N/A 07/2007  . left shoulder surgery Left 06/03/2009  . RIGHT AND LEFT HEART CATH  07/18/2007  . right shoulder surgery  2003  . TEE WITHOUT CARDIOVERSION N/A 11/21/2018   Procedure: TRANSESOPHAGEAL ECHOCARDIOGRAM (TEE);  Surgeon: Wellington Hampshire, MD;  Location: [facility] ORS;  Service: Cardiovascular;  Laterality: N/A;  Lonia Farber     Social History   Socioeconomic History  . Marital status: Married    Spouse name: Not on file  .  Number of children: 2  . Years of education: Not on file  . Highest education level: Some college, no degree  Occupational History  . Occupation: retired  Tobacco Use  . Smoking status: Former Smoker    Types: Cigarettes  . Smokeless tobacco: Never Used  . Tobacco comment: Quit in 1979  Vaping Use  . Vaping Use: Never used  Substance and Sexual Activity  . Alcohol use: Never  . Drug use: Never  . Sexual activity: Yes    Partners: Female  Other Topics Concern  . Not on file  Social History Narrative  . Not on file   Social Determinants of Health   Financial Resource Strain: Low Risk   . Difficulty of Paying Living Expenses: Not hard at all  Food Insecurity: No Food Insecurity  . Worried About Charity fundraiser in the Last Year: Never true  . Ran Out of Food in the Last Year: Never true  Transportation Needs: No Transportation Needs  . Lack of Transportation (Medical): No  . Lack of Transportation (Non-Medical): No  Physical Activity: Inactive  . Days of Exercise per Week: 0 days  . Minutes of Exercise per Session: 0 min  Stress: No Stress Concern Present  . Feeling of Stress : Not at all  Social Connections: Socially Integrated  . Frequency of Communication with Friends and Family: Three times a week  . Frequency of Social Gatherings with Friends and Family: More than  three times a week  . Attends Religious Services: More than 4 times per year  . Active Member of Clubs or Organizations: Yes  . Attends Archivist Meetings: More than 4 times per year  . Marital Status: Married  Human resources officer Violence: Not At Risk  . Fear of Current or Ex-Partner: No  . Emotionally Abused: No  . Physically Abused: No  . Sexually Abused: No   Family Status  Relation Name Status  . Mother  Deceased       stroke  . Father  Deceased       lung cancer  . Brother Buyer, retail Alive       non hodgkins lyphoma  . Son  Alive   Family History  Problem Relation Age of Onset  .  Hypertension Mother   . Stroke Mother   . Lung cancer Father   . Non-Hodgkin's lymphoma Brother    Allergies  Allergen Reactions  . Atorvastatin Other (See Comments)    aches  . Ramipril Other (See Comments)    cough    Patient Care Team: Jerrol Banana., MD as PCP - General (Family Medicine) [place], [place] [facility])   Medications: Outpatient Medications Prior to Visit  Medication Sig  . amLODipine (NORVASC) 10 MG tablet Take 1 tablet (10 mg total) by mouth daily.  Marland Kitchen aspirin 81 MG chewable tablet Chew 81 mg by mouth daily.  Marland Kitchen docusate sodium (COLACE) 250 MG capsule Take 250 mg by mouth daily as needed for constipation.   . furosemide (LASIX) 40 MG tablet Take 1 tablet (40 mg total) by mouth daily as needed.  . metoprolol tartrate (LOPRESSOR) 25 MG tablet Take 1 1/2 tablet by mouth twice a day.  . montelukast (SINGULAIR) 10 MG tablet Take 1 tablet (10 mg total) by mouth at bedtime as needed (allergies).  . Multiple Vitamin (MULTIVITAMIN) tablet Take 1 tablet by mouth daily.  Marland Kitchen omeprazole (PRILOSEC) 20 MG capsule Take 20 mg by mouth daily. As needed  . rosuvastatin (CRESTOR) 20 MG tablet Take 1 tablet (20 mg total) by mouth daily.  . Vitamins/Minerals TABS Take by mouth.    No facility-administered medications prior to visit.    Review of Systems  {Labs  Heme  Chem  Endocrine  Serology  Results Review (optional):23779::" "}  Objective    There were no vitals taken for this visit. {Show previous vital signs (optional):23777::" "}  Physical Exam  ***  Last depression screening scores PHQ 2/9 Scores 08/01/2019 02/16/2019 10/24/2018  PHQ - 2 Score 0 0 0  PHQ- 9 Score - 0 0   Last fall risk screening Fall Risk  08/01/2019  Falls in the past year? 0  Number falls in past yr: 0  Injury with Fall? 0  Follow up -   Last Audit-C alcohol use screening Alcohol Use Disorder Test (AUDIT) 02/16/2019  1. How often do you have a drink containing alcohol? 0  2.  How many drinks containing alcohol do you have on a typical day when you are drinking? 0  3. How often do you have six or more drinks on one occasion? 0  AUDIT-C Score 0   A score of 3 or more in women, and 4 or more in men indicates increased risk for alcohol abuse, EXCEPT if all of the points are from question 1   No results found for any visits on 02/22/20.  Assessment & Plan    Routine Health Maintenance and Physical  Exam  Exercise Activities and Dietary recommendations Goals    . Exercise 3x per week (30 min per time)     Recommend to start walking 3 days a week for at least 30 minutes at a time.        Immunization History  Administered Date(s) Administered  . Influenza Split 11/18/2009, 11/14/2010, 11/16/2011, 12/02/2012, 11/24/2013, 12/04/2014, 11/15/2017  . Influenza, High Dose Seasonal PF 12/27/2015, 11/10/2016, 11/15/2017, 11/25/2017, 10/24/2018  . Influenza, Seasonal, Injecte, Preservative Fre 11/16/2011, 12/02/2012, 11/24/2013  . Influenza,inj,Quad PF,6+ Mos 12/05/2014  . Influenza,inj,quad, With Preservative 12/05/2014  . PFIZER SARS-COV-2 Vaccination 02/15/2019, 03/11/2019  . Pneumococcal Conjugate-13 09/14/2013  . Pneumococcal Polysaccharide-23 03/03/2007  . Tdap 05/01/2011  . Tetanus 10/19/1985, 03/03/2007  . Zoster 03/08/2007    Health Maintenance  Topic Date Due  . COVID-19 Vaccine (3 - Booster for Pfizer series) 09/08/2019  . INFLUENZA VACCINE  09/10/2019  . TETANUS/TDAP  04/30/2021  . PNA vac Low Risk Adult  Completed    Discussed health benefits of physical activity, and encouraged him to engage in regular exercise appropriate for his age and condition.  ***  No follow-ups on file.     {provider attestation***:1}   [name], MD  [facility] [phone] (phone) [phone] (fax)  [place]

## 2020-02-27 ENCOUNTER — Other Ambulatory Visit: Payer: Self-pay

## 2020-02-27 ENCOUNTER — Ambulatory Visit (INDEPENDENT_AMBULATORY_CARE_PROVIDER_SITE_OTHER): Payer: Medicare Other | Admitting: Family Medicine

## 2020-02-27 ENCOUNTER — Encounter: Payer: Self-pay | Admitting: Family Medicine

## 2020-02-27 VITALS — BP 138/63 | HR 61 | Temp 98.0°F | Resp 16 | Wt 244.0 lb

## 2020-02-27 DIAGNOSIS — K5792 Diverticulitis of intestine, part unspecified, without perforation or abscess without bleeding: Secondary | ICD-10-CM

## 2020-02-27 DIAGNOSIS — R197 Diarrhea, unspecified: Secondary | ICD-10-CM | POA: Diagnosis not present

## 2020-02-27 MED ORDER — DOXYCYCLINE HYCLATE 100 MG PO TABS: 100.0000 mg | ORAL_TABLET | Freq: Two times a day (BID) | ORAL | 0 refills | Status: DC

## 2020-02-27 NOTE — Progress Notes (Signed)
Established patient visit   Patient: Jacob Montes   DOB: 1940-09-08   80 y.o. Male  MRN: [id number] Visit Date: 02/27/2020  Today's healthcare provider: [name], MD   Chief Complaint  Patient presents with  . Diarrhea   Subjective    Diarrhea  This is a recurrent problem. The current episode started more than 1 month ago. The problem has been unchanged. The stool consistency is described as watery. The patient states that diarrhea awakens him from sleep. Associated symptoms include abdominal pain, bloating and increased flatus. Pertinent negatives include no fever or vomiting.  Patient and wife state he has a history of diverticulitis in the past what this is.  He says his discomfort is across his suprapubic area and is not on either side greater than the other. This is been "an intermittent" problem for several years.  Is been going on for 1 to 2 weeks. Thousand 6 which evidently showed some diverticulosis.      Medications: Outpatient Medications Prior to Visit  Medication Sig  . amLODipine (NORVASC) 10 MG tablet Take 1 tablet (10 mg total) by mouth daily.  Marland Kitchen aspirin 81 MG chewable tablet Chew 81 mg by mouth daily.  Marland Kitchen docusate sodium (COLACE) 250 MG capsule Take 250 mg by mouth daily as needed for constipation.   . furosemide (LASIX) 40 MG tablet Take 1 tablet (40 mg total) by mouth daily as needed.  . metoprolol tartrate (LOPRESSOR) 25 MG tablet Take 1 1/2 tablet by mouth twice a day.  . montelukast (SINGULAIR) 10 MG tablet Take 1 tablet (10 mg total) by mouth at bedtime as needed (allergies).  . Multiple Vitamin (MULTIVITAMIN) tablet Take 1 tablet by mouth daily.  Marland Kitchen omeprazole (PRILOSEC) 20 MG capsule Take 20 mg by mouth daily. As needed  . rosuvastatin (CRESTOR) 20 MG tablet Take 1 tablet (20 mg total) by mouth daily.  . Vitamins/Minerals TABS Take by mouth.    No facility-administered medications prior to visit.    Review of Systems  Constitutional:  Negative for fever.  Gastrointestinal: Positive for abdominal pain, bloating, diarrhea and flatus. Negative for vomiting.    Last hemoglobin A1c Lab Results  Component Value Date   HGBA1C 5.6 09/27/2019       Objective    BP 138/63   Pulse 61   Temp 98 F (36.7 C)   Resp 16   Wt 244 lb (110.7 kg)   BMI 39.38 kg/m  BP Readings from Last 3 Encounters:  02/27/20 138/63  02/21/20 134/64  09/27/19 133/66   Wt Readings from Last 3 Encounters:  02/27/20 244 lb (110.7 kg)  02/21/20 243 lb (110.2 kg)  09/27/19 246 lb (111.6 kg)       Physical Exam Vitals reviewed.  Constitutional:      Appearance: Normal appearance.  HENT:     Head: Normocephalic and atraumatic.     Right Ear: External ear normal.     Left Ear: External ear normal.  Eyes:     General: No scleral icterus.    Conjunctiva/sclera: Conjunctivae normal.  Cardiovascular:     Rate and Rhythm: Normal rate and regular rhythm.     Pulses: Normal pulses.     Heart sounds: Normal heart sounds.     Comments: No carotid bruit Pulmonary:     Effort: Pulmonary effort is normal.     Breath sounds: Normal breath sounds.  Abdominal:     General: There is no distension.  Palpations: Abdomen is soft.     Tenderness: There is no abdominal tenderness.  Musculoskeletal:     Right lower leg: No edema.     Left lower leg: No edema.  Skin:    General: Skin is warm and dry.  Neurological:     General: No focal deficit present.     Mental Status: He is alert and oriented to person, place, and time.  Psychiatric:        Mood and Affect: Mood normal.        Behavior: Behavior normal.        Thought Content: Thought content normal.        Judgment: Judgment normal.       No results found for any visits on 02/27/20.  Assessment & Plan     1. Diverticulitis Presumptive diagnosis with essentially normal exam.  May need referral to GI if this persist. - doxycycline (VIBRA-TABS) 100 MG tablet; Take 1 tablet (100 mg  total) by mouth 2 (two) times daily.  Dispense: 14 tablet; Refill: 0  2. Diarrhea of presumed infectious origin Follow clinically.   No follow-ups on file.      I, [name], MD, have reviewed all documentation for this visit. The documentation on 03/02/20 for the exam, diagnosis, procedures, and orders are all accurate and complete.    Brynlyn Dade Cranford Mon, MD  [facility] [phone] (phone) [phone] (fax)  [place]

## 2020-02-28 ENCOUNTER — Ambulatory Visit: Payer: No Typology Code available for payment source | Admitting: Family Medicine

## 2020-03-06 DIAGNOSIS — I479 Paroxysmal tachycardia, unspecified: Secondary | ICD-10-CM | POA: Diagnosis not present

## 2020-03-11 DIAGNOSIS — M79674 Pain in right toe(s): Secondary | ICD-10-CM | POA: Diagnosis not present

## 2020-03-11 DIAGNOSIS — B351 Tinea unguium: Secondary | ICD-10-CM | POA: Diagnosis not present

## 2020-03-11 DIAGNOSIS — M79675 Pain in left toe(s): Secondary | ICD-10-CM | POA: Diagnosis not present

## 2020-03-12 DIAGNOSIS — I479 Paroxysmal tachycardia, unspecified: Secondary | ICD-10-CM | POA: Diagnosis not present

## 2020-04-01 ENCOUNTER — Telehealth: Payer: Self-pay

## 2020-04-01 NOTE — Telephone Encounter (Signed)
Attempted to reach pt, LDM on VM, okay by DPR. Zio results on pt's MyChart, Seen by patient Beaux Wedemeyer on 03/31/2020 7:53 PM. Reviewed result on VM, advised for any questions to call the clinic.

## 2020-04-07 ENCOUNTER — Other Ambulatory Visit: Payer: Self-pay | Admitting: Family Medicine

## 2020-04-07 NOTE — Telephone Encounter (Signed)
Requested Prescriptions  Pending Prescriptions Disp Refills  . rosuvastatin (CRESTOR) 20 MG tablet [Pharmacy Med Name: Rosuvastatin Calcium 20 MG Oral Tablet] 90 tablet 3    Sig: Take 1 tablet by mouth once daily     Cardiovascular:  Antilipid - Statins Failed - 04/07/2020  8:25 AM      Failed - LDL in normal range and within 360 days    LDL Chol Calc (NIH)  Date Value Ref Range Status  09/27/2019 73 0 - 99 mg/dL Final         Passed - Total Cholesterol in normal range and within 360 days    Cholesterol, Total  Date Value Ref Range Status  09/27/2019 133 100 - 199 mg/dL Final         Passed - HDL in normal range and within 360 days    HDL  Date Value Ref Range Status  09/27/2019 43 >39 mg/dL Final         Passed - Triglycerides in normal range and within 360 days    Triglycerides  Date Value Ref Range Status  09/27/2019 91 0 - 149 mg/dL Final         Passed - Patient is not pregnant      Passed - Valid encounter within last 12 months    Recent Outpatient Visits          1 month ago Diverticulitis   [facility] Jerrol Banana., MD   6 months ago Benign essential hypertension   [facility] Jerrol Banana., MD   8 months ago Benign essential hypertension   [facility] Jerrol Banana., MD   10 months ago Benign essential hypertension   [facility] Jerrol Banana., MD   12 months ago Onychomycosis   [facility] Jerrol Banana., MD      Future Appointments            In 1 month Jerrol Banana., MD [facility] [place]   In 2 months Ralene Bathe, MD [place]

## 2020-05-21 ENCOUNTER — Other Ambulatory Visit: Payer: Self-pay | Admitting: Family Medicine

## 2020-05-21 DIAGNOSIS — I10 Essential (primary) hypertension: Secondary | ICD-10-CM

## 2020-05-29 ENCOUNTER — Ambulatory Visit: Payer: Medicare Other | Admitting: Dermatology

## 2020-05-29 ENCOUNTER — Other Ambulatory Visit: Payer: Self-pay

## 2020-05-29 DIAGNOSIS — Z85828 Personal history of other malignant neoplasm of skin: Secondary | ICD-10-CM

## 2020-05-29 DIAGNOSIS — D229 Melanocytic nevi, unspecified: Secondary | ICD-10-CM

## 2020-05-29 DIAGNOSIS — Z1283 Encounter for screening for malignant neoplasm of skin: Secondary | ICD-10-CM

## 2020-05-29 DIAGNOSIS — L820 Inflamed seborrheic keratosis: Secondary | ICD-10-CM | POA: Diagnosis not present

## 2020-05-29 DIAGNOSIS — D1800 Hemangioma unspecified site: Secondary | ICD-10-CM

## 2020-05-29 DIAGNOSIS — L570 Actinic keratosis: Secondary | ICD-10-CM

## 2020-05-29 DIAGNOSIS — B351 Tinea unguium: Secondary | ICD-10-CM | POA: Diagnosis not present

## 2020-05-29 DIAGNOSIS — L814 Other melanin hyperpigmentation: Secondary | ICD-10-CM

## 2020-05-29 DIAGNOSIS — L578 Other skin changes due to chronic exposure to nonionizing radiation: Secondary | ICD-10-CM

## 2020-05-29 DIAGNOSIS — L821 Other seborrheic keratosis: Secondary | ICD-10-CM

## 2020-05-29 MED ORDER — TERBINAFINE HCL 250 MG PO TABS: 250.0000 mg | ORAL_TABLET | Freq: Every day | ORAL | 0 refills | Status: DC

## 2020-05-29 NOTE — Patient Instructions (Addendum)
If you have any questions or concerns for your doctor, please call our main line at [phone] and press option 4 to reach your doctor's medical assistant. If no one answers, please leave a voicemail as directed and we will return your call as soon as possible. Messages left after 4 pm will be answered the following business day.   You may also send us a message via MyChart. We typically respond to MyChart messages within 1-2 business days.  For prescription refills, please ask your pharmacy to contact our office. Our fax number is [phone].  If you have an urgent issue when the clinic is closed that cannot wait until the next business day, you can page your doctor at the number below.    Please note that while we do our best to be available for urgent issues outside of office hours, we are not available 24/7.   If you have an urgent issue and are unable to reach us, you may choose to seek medical care at your doctor's office, retail clinic, urgent care center, or emergency room.  If you have a medical emergency, please immediately call 911 or go to the emergency department.  Pager Numbers  - Dr. Kowalski: [phone]  - Dr. Moye: [phone]  - Dr. Stewart: [phone]  In the event of inclement weather, please call our main line at [phone] for an update on the status of any delays or closures.  Dermatology Medication Tips: Please keep the boxes that topical medications come in in order to help keep track of the instructions about where and how to use these. Pharmacies typically print the medication instructions only on the boxes and not directly on the medication tubes.   If your medication is too expensive, please contact our office at [phone] option 4 or send us a message through MyChart.   We are unable to tell what your co-pay for medications will be in advance as this is different depending on your insurance coverage. However, we may be able to find a substitute  medication at lower cost or fill out paperwork to get insurance to cover a needed medication.   If a prior authorization is required to get your medication covered by your insurance company, please allow us 1-2 business days to complete this process.  Drug prices often vary depending on where the prescription is filled and some pharmacies may offer cheaper prices.  The website www.goodrx.com contains coupons for medications through different pharmacies. The prices here do not account for what the cost may be with help from insurance (it may be cheaper with your insurance), but the website can give you the price if you did not use any insurance.  - You can print the associated coupon and take it with your prescription to the pharmacy.  - You may also stop by our office during regular business hours and pick up a GoodRx coupon card.  - If you need your prescription sent electronically to a different pharmacy, notify our office through [facility] MyChart or by phone at [phone] option 4.   Terbinafine Counseling  Terbinafine is an anti-fungal medicine that can be applied to the skin (over the counter) or taken by mouth (prescription) to treat fungal infections. The pill version is often used to treat fungal infections of the nails or scalp. While most people do not have any side effects from taking terbinafine pills, some possible side effects of the medicine can include taste changes, headache, loss of smell, vision changes, nausea, vomiting,   or diarrhea.   Rare side effects can include irritation of the liver, allergic reaction, or decrease in blood counts (which may show up as not feeling well or developing an infection). If you are concerned about any of these side effects, please stop the medicine and call your doctor, or in the case of an emergency such as feeling very unwell, seek immediate medical care.    

## 2020-05-29 NOTE — Progress Notes (Signed)
New Patient Visit  Subjective  Jacob Montes is a 80 y.o. 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 He also has a hx of AK's and has noticed lesions on his face he would like checked). The patient presents for Total-Body Skin Exam (TBSE) for skin cancer screening and mole check.  The following portions of the chart were reviewed this encounter and updated as appropriate:   Tobacco  Allergies  Meds  Problems  Med Hx  Surg Hx  Fam Hx     Review of Systems:  No other skin or systemic complaints except as noted in HPI or Assessment and Plan.  Objective  Well appearing patient in no apparent distress; mood and affect are within normal limits.  A full examination was performed including scalp, head, eyes, ears, nose, lips, neck, chest, axillae, abdomen, back, buttocks, bilateral upper extremities, bilateral lower extremities, hands, feet, fingers, toes, fingernails, and toenails. All findings within normal limits unless otherwise noted below.  Objective  Face and scalp (17): Erythematous thin papules/macules with gritty scale.   Objective  Back x 1: Erythematous keratotic or waxy stuck-on papule or plaque.   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  Severe. Start Lamisil 250mg  po QD. D/C cholesterol medication (Crestor) while on treatment.  Terbinafine Counseling  Terbinafine is an anti-fungal medicine that can be applied to the skin (over the counter) or taken by mouth (prescription) to treat fungal infections. The pill version is often used to treat fungal infections of the nails or scalp. While most people do not have any side effects from taking terbinafine pills, some possible side effects of the medicine can include taste changes, headache, loss of smell, vision changes, nausea, vomiting, or diarrhea.   Rare side effects can include irritation of the liver, allergic reaction, or decrease in blood counts (which may show up as not feeling well or developing an infection). If you are concerned about any of these side effects, please stop the medicine and call your doctor, or in the case of an emergency such as feeling very unwell, seek immediate medical care.   terbinafine (LAMISIL) 250 MG tablet - B/L foot  Inflamed seborrheic keratosis Back x 1 Destruction of lesion - Back x 1 Complexity: simple   Destruction method: cryotherapy   Informed consent: discussed and consent obtained   Timeout:  patient name, date of birth, surgical site, and procedure verified Lesion destroyed using liquid nitrogen: Yes   Region frozen until ice ball extended beyond lesion: Yes   Outcome: patient tolerated procedure well with no complications   Post-procedure details: wound care instructions given     Lentigines - Scattered tan macules - Due to sun exposure - Benign-appering, observe - Recommend daily broad spectrum sunscreen SPF 30+ to sun-exposed areas, reapply every 2 hours as needed. - Call for any changes  Seborrheic Keratoses - Stuck-on, waxy, tan-brown papules and/or plaques  - Benign-appearing - Discussed benign etiology and prognosis. - Observe - Call for any changes  Melanocytic Nevi - Tan-brown and/or pink-flesh-colored symmetric macules and papules - Benign appearing on exam today - Observation - Call clinic for new or  changing moles - Recommend daily use of broad spectrum spf 30+ sunscreen to sun-exposed areas.   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  - Call if any new or changing lesions are noted between office visits  Actinic Damage - Severe, confluent actinic changes with pre-cancerous actinic keratoses  - Severe, chronic, not at goal, secondary to cumulative UV radiation exposure over time - diffuse scaly erythematous macules and papules with underlying dyspigmentation - Discussed Prescription "Field Treatment" for Severe, Chronic Confluent Actinic Changes with Pre-Cancerous Actinic Keratoses Field treatment involves treatment of an entire area of skin that has confluent Actinic Changes (Sun/ Ultraviolet light damage) and PreCancerous Actinic Keratoses by method of PhotoDynamic Therapy (PDT) and/or prescription Topical Chemotherapy agents such as 5-fluorouracil, 5-fluorouracil/calcipotriene, and/or imiquimod.  The purpose is to decrease the number of clinically evident and subclinical PreCancerous lesions to prevent progression to development of skin cancer by chemically destroying early precancer changes that may or may not be visible.  It has been shown to reduce the risk of developing skin cancer in the treated area. As a result of treatment, redness, scaling, crusting, and open sores may occur during treatment course. One or more than one of these methods may be used and may have to be used several times to control, suppress and eliminate the PreCancerous changes. Discussed treatment course, expected reaction, and possible side effects. - Recommend daily broad spectrum sunscreen SPF 30+ to sun-exposed areas, reapply  every 2 hours as needed.  - Staying in the shade or wearing long sleeves, sun glasses (UVA+UVB protection) and wide brim hats (4-inch brim around the entire circumference of the hat) are also recommended. - Call for new or changing lesions. - In one month start PDT on the face, 2 weeks later start PDT on the scalp, 2 weeks after that PDT to the arms and hands.  Skin cancer screening performed today.  Return in about 5 weeks (around 07/03/2020) for tinea pedis and unguium follow up . Return in 5 weeks for PDT of the face, in 7 weeks for PDT of the scalp, and in 9 weeks for PDT of the arms and hands.  Luther Redo, CMA, am acting as scribe for Sarina Ser, MD .  Documentation: I have reviewed the above documentation for accuracy and completeness, and I agree with the above.  Sarina Ser, MD

## 2020-06-02 ENCOUNTER — Encounter: Payer: Self-pay | Admitting: Dermatology

## 2020-06-03 ENCOUNTER — Encounter: Payer: Self-pay | Admitting: Family Medicine

## 2020-06-03 ENCOUNTER — Other Ambulatory Visit: Payer: Self-pay

## 2020-06-03 ENCOUNTER — Ambulatory Visit (INDEPENDENT_AMBULATORY_CARE_PROVIDER_SITE_OTHER): Payer: Medicare Other | Admitting: Family Medicine

## 2020-06-03 VITALS — BP 138/63 | HR 50 | Temp 98.0°F | Resp 16 | Ht 66.0 in | Wt 240.0 lb

## 2020-06-03 DIAGNOSIS — I6523 Occlusion and stenosis of bilateral carotid arteries: Secondary | ICD-10-CM

## 2020-06-03 DIAGNOSIS — Z1389 Encounter for screening for other disorder: Secondary | ICD-10-CM

## 2020-06-03 DIAGNOSIS — Z0000 Encounter for general adult medical examination without abnormal findings: Secondary | ICD-10-CM | POA: Diagnosis not present

## 2020-06-03 DIAGNOSIS — B351 Tinea unguium: Secondary | ICD-10-CM

## 2020-06-03 DIAGNOSIS — I10 Essential (primary) hypertension: Secondary | ICD-10-CM | POA: Diagnosis not present

## 2020-06-03 DIAGNOSIS — I25708 Atherosclerosis of coronary artery bypass graft(s), unspecified, with other forms of angina pectoris: Secondary | ICD-10-CM

## 2020-06-03 DIAGNOSIS — E782 Mixed hyperlipidemia: Secondary | ICD-10-CM

## 2020-06-03 DIAGNOSIS — K5792 Diverticulitis of intestine, part unspecified, without perforation or abscess without bleeding: Secondary | ICD-10-CM

## 2020-06-03 DIAGNOSIS — I2510 Atherosclerotic heart disease of native coronary artery without angina pectoris: Secondary | ICD-10-CM

## 2020-06-03 DIAGNOSIS — I350 Nonrheumatic aortic (valve) stenosis: Secondary | ICD-10-CM

## 2020-06-03 DIAGNOSIS — R739 Hyperglycemia, unspecified: Secondary | ICD-10-CM

## 2020-06-03 LAB — POCT URINALYSIS DIPSTICK
Bilirubin, UA: NEGATIVE
Blood, UA: NEGATIVE
Glucose, UA: NEGATIVE
Ketones, UA: NEGATIVE
Leukocytes, UA: NEGATIVE
Nitrite, UA: NEGATIVE
Protein, UA: NEGATIVE
Spec Grav, UA: 1.015 (ref 1.010–1.025)
Urobilinogen, UA: 0.2 E.U./dL
pH, UA: 6 (ref 5.0–8.0)

## 2020-06-03 NOTE — Progress Notes (Signed)
Complete physical exam   Patient: Jacob Montes   DOB: [birth year]   80 y.o. Male  MRN: [id number] Visit Date: 06/03/2020  Today's healthcare provider: [name], MD   Chief Complaint  Patient presents with  . Annual Exam   Subjective    Jacob Montes is a 80 y.o. male who presents today for a complete physical exam.  He reports consuming a general diet. The patient does not participate in regular exercise at present. He generally feels well. He reports sleeping fairly well. He does not have additional problems to discuss today.  HPI  Patient had AWV with [facility] on 07/31/2019.  Past Medical History:  Diagnosis Date  . Aortic stenosis    a. s/p TAVR 03/2017  . CAD (coronary artery disease)    a. s/p 3-v CABG in 07/2007  . Carotid artery disease ([place])    a. s/p left sided CEA complicated by RLN parlaysis   . Cataract   . DVT (deep venous thrombosis) ([place]) 08/2010  . 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 GERD (gastroesophageal reflux disease)   . HLD (hyperlipidemia)   . Hyperlipidemia 10/15/2015  . Hypertension   . Nephrolithiasis 11/16/2018  . Pulmonary embolism ([place]) 08/2010  . Skin cancer   . Sleep apnea   . Stroke [facility])    Past Surgical History:  Procedure Laterality Date  . Carotid surgery Left 03/08/2007  . CHOLECYSTECTOMY  02/16/2018  . ERCP N/A 11/18/2018   Procedure: ENDOSCOPIC RETROGRADE CHOLANGIOPANCREATOGRAPHY (ERCP);  Surgeon: [name], MD;  Location: [facility] ENDOSCOPY;  Service: Endoscopy;  Laterality: N/A;  . Heart Bypass N/A 07/2007  . left shoulder surgery Left 06/03/2009  . RIGHT AND LEFT HEART CATH  07/18/2007  . right shoulder surgery  2003  . TEE WITHOUT CARDIOVERSION N/A 11/21/2018   Procedure: TRANSESOPHAGEAL ECHOCARDIOGRAM (TEE);  Surgeon: Wellington Hampshire, MD;  Location: [facility] ORS;  Service: Cardiovascular;  Laterality: N/A;  Lonia Farber     Social History   Socioeconomic History  . Marital status: Married    Spouse name: Not on file  .  Number of children: 2  . Years of education: Not on file  . Highest education level: Some college, no degree  Occupational History  . Occupation: retired  Tobacco Use  . Smoking status: Former Smoker    Types: Cigarettes  . Smokeless tobacco: Never Used  . Tobacco comment: Quit in 1979  Vaping Use  . Vaping Use: Never used  Substance and Sexual Activity  . Alcohol use: Never  . Drug use: Never  . Sexual activity: Yes    Partners: Female  Other Topics Concern  . Not on file  Social History Narrative  . Not on file   Social Determinants of Health   Financial Resource Strain: Low Risk   . Difficulty of Paying Living Expenses: Not hard at all  Food Insecurity: No Food Insecurity  . Worried About Charity fundraiser in the Last Year: Never true  . Ran Out of Food in the Last Year: Never true  Transportation Needs: No Transportation Needs  . Lack of Transportation (Medical): No  . Lack of Transportation (Non-Medical): No  Physical Activity: Inactive  . Days of Exercise per Week: 0 days  . Minutes of Exercise per Session: 0 min  Stress: No Stress Concern Present  . Feeling of Stress : Not at all  Social Connections: Socially Integrated  . Frequency of Communication with Friends and Family: Three times a week  .  Frequency of Social Gatherings with Friends and Family: More than three times a week  . Attends Religious Services: More than 4 times per year  . Active Member of Clubs or Organizations: Yes  . Attends Archivist Meetings: More than 4 times per year  . Marital Status: Married  Human resources officer Violence: Not At Risk  . Fear of Current or Ex-Partner: No  . Emotionally Abused: No  . Physically Abused: No  . Sexually Abused: No   Family Status  Relation Name Status  . Mother  Deceased       stroke  . Father  Deceased       lung cancer  . Brother Buyer, retail Alive       non hodgkins lyphoma  . Son  Alive   Family History  Problem Relation Age of Onset  .  Hypertension Mother   . Stroke Mother   . Lung cancer Father   . Non-Hodgkin's lymphoma Brother    Allergies  Allergen Reactions  . Atorvastatin Other (See Comments)    aches  . Ramipril Other (See Comments)    cough    Patient Care Team: Jerrol Banana., MD as PCP - General (Family Medicine) [facility], [facility])   Medications: Outpatient Medications Prior to Visit  Medication Sig  . amLODipine (NORVASC) 10 MG tablet Take 1 tablet (10 mg total) by mouth daily.  Marland Kitchen aspirin 81 MG chewable tablet Chew 81 mg by mouth daily.  Marland Kitchen docusate sodium (COLACE) 250 MG capsule Take 250 mg by mouth daily as needed for constipation.   Marland Kitchen doxycycline (VIBRA-TABS) 100 MG tablet Take 1 tablet (100 mg total) by mouth 2 (two) times daily.  . furosemide (LASIX) 40 MG tablet Take 1 tablet (40 mg total) by mouth daily as needed.  . metoprolol tartrate (LOPRESSOR) 25 MG tablet TAKE 1 & 1/2 (ONE & ONE-HALF) TABLETS BY MOUTH TWICE DAILY  . montelukast (SINGULAIR) 10 MG tablet Take 1 tablet (10 mg total) by mouth at bedtime as needed (allergies).  . Multiple Vitamin (MULTIVITAMIN) tablet Take 1 tablet by mouth daily.  Marland Kitchen omeprazole (PRILOSEC) 20 MG capsule Take 20 mg by mouth daily. As needed  . rosuvastatin (CRESTOR) 20 MG tablet Take 1 tablet by mouth once daily  . terbinafine (LAMISIL) 250 MG tablet Take 1 tablet (250 mg total) by mouth daily.  . Vitamins/Minerals TABS Take by mouth.    No facility-administered medications prior to visit.    Review of Systems  All other systems reviewed and are negative.      Objective    BP 138/63   Pulse (!) 50   Temp 98 F (36.7 C)   Resp 16   Ht 5\' 6"  (1.676 m)   Wt 240 lb (108.9 kg)   BMI 38.74 kg/m  BP Readings from Last 3 Encounters:  06/03/20 138/63  02/27/20 138/63  02/21/20 134/64   Wt Readings from Last 3 Encounters:  06/03/20 240 lb (108.9 kg)  02/27/20 244 lb (110.7 kg)  02/21/20 243 lb (110.2 kg)      Physical  Exam Vitals reviewed.  Constitutional:      Appearance: Normal appearance. He is obese.  HENT:     Head: Normocephalic and atraumatic.     Right Ear: External ear normal.     Left Ear: External ear normal.     Mouth/Throat:     Mouth: Mucous membranes are moist.     Pharynx: Oropharynx is clear.  Eyes:     General: No scleral icterus.    Conjunctiva/sclera: Conjunctivae normal.  Neck:     Vascular: No carotid bruit.  Cardiovascular:     Rate and Rhythm: Normal rate and regular rhythm.     Pulses: Normal pulses.     Heart sounds: Normal heart sounds.     Comments: No carotid bruit. 2/6 systolic murmur best heard at right upper sternal border Pulmonary:     Effort: Pulmonary effort is normal.     Breath sounds: Normal breath sounds.  Abdominal:     Palpations: Abdomen is soft.  Genitourinary:    Penis: Normal.      Testes: Normal.  Musculoskeletal:     Right lower leg: No edema.     Left lower leg: No edema.  Lymphadenopathy:     Cervical: No cervical adenopathy.  Skin:    General: Skin is warm and dry.     Comments: Multiple SKs.  Neurological:     General: No focal deficit present.     Mental Status: He is alert and oriented to person, place, and time.  Psychiatric:        Mood and Affect: Mood normal.        Behavior: Behavior normal.        Thought Content: Thought content normal.        Judgment: Judgment normal.       Last depression screening scores PHQ 2/9 Scores 08/01/2019 02/16/2019 10/24/2018  PHQ - 2 Score 0 0 0  PHQ- 9 Score - 0 0   Last fall risk screening Fall Risk  08/01/2019  Falls in the past year? 0  Number falls in past yr: 0  Injury with Fall? 0  Follow up -   Last Audit-C alcohol use screening Alcohol Use Disorder Test (AUDIT) 02/16/2019  1. How often do you have a drink containing alcohol? 0  2. How many drinks containing alcohol do you have on a typical day when you are drinking? 0  3. How often do you have six or more drinks on one  occasion? 0  AUDIT-C Score 0   A score of 3 or more in women, and 4 or more in men indicates increased risk for alcohol abuse, EXCEPT if all of the points are from question 1   No results found for any visits on 06/03/20.  Assessment & Plan    Routine Health Maintenance and Physical Exam  Exercise Activities and Dietary recommendations Goals    . Exercise 3x per week (30 min per time)     Recommend to start walking 3 days a week for at least 30 minutes at a time.        Immunization History  Administered Date(s) Administered  . Influenza Split 11/18/2009, 11/14/2010, 11/16/2011, 12/02/2012, 11/24/2013, 12/04/2014, 11/15/2017  . Influenza, High Dose Seasonal PF 12/27/2015, 11/10/2016, 11/15/2017, 11/25/2017, 10/24/2018  . Influenza, Seasonal, Injecte, Preservative Fre 11/16/2011, 12/02/2012, 11/24/2013  . Influenza,inj,Quad PF,6+ Mos 12/05/2014  . Influenza,inj,quad, With Preservative 12/05/2014  . PFIZER(Purple Top)SARS-COV-2 Vaccination 02/15/2019, 03/11/2019  . Pneumococcal Conjugate-13 09/14/2013  . Pneumococcal Polysaccharide-23 03/03/2007  . Tdap 05/01/2011  . Tetanus 10/19/1985, 03/03/2007  . Zoster 03/08/2007    Health Maintenance  Topic Date Due  . COVID-19 Vaccine (3 - Pfizer risk 4-dose series) 04/08/2019  . INFLUENZA VACCINE  09/09/2020  . TETANUS/TDAP  04/30/2021  . PNA vac Low Risk Adult  Completed  . HPV VACCINES  Aged Out    Discussed health benefits  of physical activity, and encouraged him to engage in regular exercise appropriate for his age and condition.  1. Annual physical exam Patient declines any further screening testing.  We will do all follow-ups and treatment of problems.  2. Mixed hyperlipidemia  - TSH - Lipid panel - Comprehensive Metabolic Panel (CMET)  3. Hyperglycemia Keep weight down to avoid diabetes - Hemoglobin A1c - Comprehensive Metabolic Panel (CMET)  4. Benign essential hypertension Good control most home blood pressure  readings for this 80 year old gentleman are 597C to 163A systolic - CBC w/Diff/Platelet - Comprehensive Metabolic Panel (CMET)  5. Screening for blood or protein in urine  - POCT urinalysis dipstick  6. Carotid stenosis, bilateral   7. H/o Diverticulitis   8. Aortic valve stenosis, etiology of cardiac valve disease unspecified Followed by cardiology  9. Coronary artery disease of bypass graft of native heart with stable angina pectoris ([place]) Risk factors treated  10. Onychomycosis Podiatry.   No follow-ups on file.     I, [name], MD, have reviewed all documentation for this visit. The documentation on 06/08/20 for the exam, diagnosis, procedures, and orders are all accurate and complete.    Larcenia Holaday Cranford Mon, MD  [facility] [phone] (phone) [phone] (fax)  [place]

## 2020-06-04 LAB — COMPREHENSIVE METABOLIC PANEL
ALT: 21 IU/L (ref 0–44)
AST: 22 IU/L (ref 0–40)
Albumin/Globulin Ratio: 1.9 (ref 1.2–2.2)
Albumin: 4.3 g/dL (ref 3.7–4.7)
Alkaline Phosphatase: 49 IU/L (ref 44–121)
BUN/Creatinine Ratio: 18 (ref 10–24)
BUN: 15 mg/dL (ref 8–27)
Bilirubin Total: 1.5 mg/dL — ABNORMAL HIGH (ref 0.0–1.2)
CO2: 24 mmol/L (ref 20–29)
Calcium: 9.1 mg/dL (ref 8.6–10.2)
Chloride: 104 mmol/L (ref 96–106)
Creatinine, Ser: 0.84 mg/dL (ref 0.76–1.27)
Globulin, Total: 2.3 g/dL (ref 1.5–4.5)
Glucose: 86 mg/dL (ref 65–99)
Potassium: 4.7 mmol/L (ref 3.5–5.2)
Sodium: 142 mmol/L (ref 134–144)
Total Protein: 6.6 g/dL (ref 6.0–8.5)
eGFR: 89 mL/min/{1.73_m2} (ref >59)

## 2020-06-04 LAB — CBC WITH DIFFERENTIAL/PLATELET
Basophils Absolute: 0 10*3/uL (ref 0.0–0.2)
Basos: 0 %
EOS (ABSOLUTE): 0.2 10*3/uL (ref 0.0–0.4)
Eos: 2 %
Hematocrit: 42.6 % (ref 37.5–51.0)
Hemoglobin: 14.5 g/dL (ref 13.0–17.7)
Immature Grans (Abs): 0 10*3/uL (ref 0.0–0.1)
Immature Granulocytes: 0 %
Lymphocytes Absolute: 3.2 10*3/uL — ABNORMAL HIGH (ref 0.7–3.1)
Lymphs: 40 %
MCH: 32.4 pg (ref 26.6–33.0)
MCHC: 34 g/dL (ref 31.5–35.7)
MCV: 95 fL (ref 79–97)
Monocytes Absolute: 0.6 10*3/uL (ref 0.1–0.9)
Monocytes: 8 %
Neutrophils Absolute: 3.9 10*3/uL (ref 1.4–7.0)
Neutrophils: 50 %
Platelets: 106 10*3/uL — ABNORMAL LOW (ref 150–450)
RBC: 4.47 x10E6/uL (ref 4.14–5.80)
RDW: 13.1 % (ref 11.6–15.4)
WBC: 7.9 10*3/uL (ref 3.4–10.8)

## 2020-06-04 LAB — HEMOGLOBIN A1C
Est. average glucose Bld gHb Est-mCnc: 120 mg/dL
Hgb A1c MFr Bld: 5.8 % — ABNORMAL HIGH (ref 4.8–5.6)

## 2020-06-04 LAB — TSH: TSH: 0.701 u[IU]/mL (ref 0.450–4.500)

## 2020-06-04 LAB — LIPID PANEL
Chol/HDL Ratio: 2.9 ratio (ref 0.0–5.0)
Cholesterol, Total: 135 mg/dL (ref 100–199)
HDL: 47 mg/dL (ref >39)
LDL Chol Calc (NIH): 67 mg/dL (ref 0–99)
Triglycerides: 114 mg/dL (ref 0–149)
VLDL Cholesterol Cal: 21 mg/dL (ref 5–40)

## 2020-06-10 DIAGNOSIS — B351 Tinea unguium: Secondary | ICD-10-CM | POA: Diagnosis not present

## 2020-06-10 DIAGNOSIS — M79674 Pain in right toe(s): Secondary | ICD-10-CM | POA: Diagnosis not present

## 2020-06-10 DIAGNOSIS — M79675 Pain in left toe(s): Secondary | ICD-10-CM | POA: Diagnosis not present

## 2020-06-12 ENCOUNTER — Ambulatory Visit: Payer: Medicare Other | Admitting: Dermatology

## 2020-06-24 ENCOUNTER — Ambulatory Visit (INDEPENDENT_AMBULATORY_CARE_PROVIDER_SITE_OTHER): Payer: Medicare Other | Admitting: Family Medicine

## 2020-06-24 ENCOUNTER — Other Ambulatory Visit: Payer: Self-pay

## 2020-06-24 ENCOUNTER — Ambulatory Visit: Payer: Self-pay | Admitting: *Deleted

## 2020-06-24 ENCOUNTER — Encounter: Payer: Self-pay | Admitting: Family Medicine

## 2020-06-24 VITALS — BP 115/64 | HR 51 | Temp 98.5°F | Resp 16 | Ht 66.0 in | Wt 237.0 lb

## 2020-06-24 DIAGNOSIS — R102 Pelvic and perineal pain: Secondary | ICD-10-CM | POA: Diagnosis not present

## 2020-06-24 DIAGNOSIS — R109 Unspecified abdominal pain: Secondary | ICD-10-CM

## 2020-06-24 MED ORDER — DOXYCYCLINE HYCLATE 100 MG PO TABS: 100.0000 mg | ORAL_TABLET | Freq: Two times a day (BID) | ORAL | 0 refills | Status: DC

## 2020-06-24 NOTE — Telephone Encounter (Signed)
Pt's wife and his wife called stating he is having abdominal pain that started 06/23/20; it is in the middle;; his pain is rated 8 out of 10; it is constant; he previously had this pain in 02/27/20 (please see note); recommendations made per nurse triage protocol; pt previously scheduled to see Dr Rosanna Randy at [facility] 06/24/20 at [place]; will route to office for notification.  Reason for Disposition . [1] MILD-MODERATE pain AND [2] constant AND [3] present > 2 hours  Answer Assessment - Initial Assessment Questions 1. LOCATION: "Where does it hurt?"      Middle of lower abdomen 2. RADIATION: "Does the pain shoot anywhere else?" (e.g., chest, back)     no 3. ONSET: "When did the pain begin?" (Minutes, hours or days ago)      06/23/20 4. SUDDEN: "Gradual or sudden onset?"      5. PATTERN "Does the pain come and go, or is it constant?"    - If constant: "Is it getting better, staying the same, or worsening?"      (Note: Constant means the pain never goes away completely; most serious pain is constant and it progresses)     - If intermittent: "How long does it last?" "Do you have pain now?"     (Note: Intermittent means the pain goes away completely between bouts)     constant 6. SEVERITY: "How bad is the pain?"  (e.g., Scale 1-10; mild, moderate, or severe)    - MILD (1-3): doesn't interfere with normal activities, abdomen soft and not tender to touch     - MODERATE (4-7): interferes with normal activities or awakens from sleep, abdomen tender to touch     - SEVERE (8-10): excruciating pain, doubled over, unable to do any normal activities        8 out of 10 7. RECURRENT SYMPTOM: "Have you ever had this type of stomach pain before?" If Yes, ask: "When was the last time?" and "What happened that time?"    Yes pt seen Jan 2022, dx diverticulitis 8. CAUSE: "What do you think is causing the stomach pain?"    diverticulitis 9. RELIEVING/AGGRAVATING FACTORS: "What makes it better or worse?"  (e.g., movement, antacids, bowel movement)     pepto helped 10. OTHER SYMPTOMS: "Do you have any other symptoms?" (e.g., back pain, diarrhea, fever, urination pain, vomiting)      no  Protocols used: ABDOMINAL PAIN - MALE-A-AH

## 2020-06-24 NOTE — Patient Instructions (Signed)
Push fluids

## 2020-06-24 NOTE — Progress Notes (Signed)
Established patient visit   Patient: Jacob Montes   DOB: 1940/05/08   80 y.o. Male  MRN: [id number] Visit Date: 06/24/2020  Today's healthcare provider: [name], MD   Chief Complaint  Patient presents with  . Abdominal Pain   Subjective    Abdominal Pain This is a new problem. The current episode started in the past 7 days. The problem occurs constantly. The problem has been gradually worsening. The pain is at a severity of 5/10. The pain is mild. The quality of the pain is dull and a sensation of fullness. The abdominal pain does not radiate. Pertinent negatives include no constipation, diarrhea, dysuria, frequency, hematuria, melena or nausea. The pain is aggravated by certain positions. The pain is relieved by nothing. He has tried antacids for the symptoms. The treatment provided mild relief.  No fever chills myalgias or any respiratory symptoms.       Medications: Outpatient Medications Prior to Visit  Medication Sig  . amLODipine (NORVASC) 10 MG tablet Take 1 tablet (10 mg total) by mouth daily.  Marland Kitchen aspirin 81 MG chewable tablet Chew 81 mg by mouth daily.  Marland Kitchen docusate sodium (COLACE) 250 MG capsule Take 250 mg by mouth daily as needed for constipation.   . furosemide (LASIX) 40 MG tablet Take 1 tablet (40 mg total) by mouth daily as needed.  . metoprolol tartrate (LOPRESSOR) 25 MG tablet TAKE 1 & 1/2 (ONE & ONE-HALF) TABLETS BY MOUTH TWICE DAILY  . montelukast (SINGULAIR) 10 MG tablet Take 1 tablet (10 mg total) by mouth at bedtime as needed (allergies).  . Multiple Vitamin (MULTIVITAMIN) tablet Take 1 tablet by mouth daily.  Marland Kitchen omeprazole (PRILOSEC) 20 MG capsule Take 20 mg by mouth daily. As needed  . rosuvastatin (CRESTOR) 20 MG tablet Take 1 tablet by mouth once daily  . terbinafine (LAMISIL) 250 MG tablet Take 1 tablet (250 mg total) by mouth daily.  . Vitamins/Minerals TABS Take by mouth.   . doxycycline (VIBRA-TABS) 100 MG tablet Take 1 tablet (100 mg  total) by mouth 2 (two) times daily.   No facility-administered medications prior to visit.    Review of Systems  Gastrointestinal: Positive for abdominal pain. Negative for constipation, diarrhea, melena and nausea.  Genitourinary: Negative for dysuria, frequency and hematuria.        Objective    BP 115/64   Pulse (!) 51   Temp 98.5 F (36.9 C)   Resp 16   Ht 5\' 6"  (1.676 m)   Wt 237 lb (107.5 kg)   BMI 38.25 kg/m  BP Readings from Last 3 Encounters:  06/24/20 115/64  06/03/20 138/63  02/27/20 138/63   Wt Readings from Last 3 Encounters:  06/24/20 237 lb (107.5 kg)  06/03/20 240 lb (108.9 kg)  02/27/20 244 lb (110.7 kg)       Physical Exam Vitals reviewed.  Constitutional:      Appearance: Normal appearance.  HENT:     Head: Normocephalic and atraumatic.     Right Ear: External ear normal.     Left Ear: External ear normal.  Eyes:     General: No scleral icterus.    Conjunctiva/sclera: Conjunctivae normal.  Cardiovascular:     Rate and Rhythm: Normal rate and regular rhythm.     Pulses: Normal pulses.     Heart sounds: Normal heart sounds.     Comments: No carotid bruit Pulmonary:     Effort: Pulmonary effort is normal.  Breath sounds: Normal breath sounds.  Abdominal:     General: There is no distension.     Palpations: Abdomen is soft.     Tenderness: There is no abdominal tenderness.  Musculoskeletal:     Right lower leg: No edema.     Left lower leg: No edema.  Skin:    General: Skin is warm and dry.  Neurological:     General: No focal deficit present.     Mental Status: He is alert and oriented to person, place, and time.  Psychiatric:        Mood and Affect: Mood normal.        Behavior: Behavior normal.        Thought Content: Thought content normal.        Judgment: Judgment normal.       No results found for any visits on 06/24/20.  Assessment & Plan     1. Abdominal pain, unspecified abdominal location Completely  unremarkable exam this morning.  This is probably viral.  Full liquid diet for now and advance slowly.  If it worsens will obtain ultrasound or appropriate work-up or referral. - doxycycline (VIBRA-TABS) 100 MG tablet; Take 1 tablet (100 mg total) by mouth 2 (two) times daily for 5 days.  Dispense: 10 tablet; Refill: 0  2. Suprapubic pain UA is negative.   No follow-ups on file.      I, [name], MD, have reviewed all documentation for this visit. The documentation on 07/01/20 for the exam, diagnosis, procedures, and orders are all accurate and complete.    Richard Cranford Mon, MD  [facility] [phone] (phone) [phone] (fax)  [place]

## 2020-06-26 ENCOUNTER — Telehealth (INDEPENDENT_AMBULATORY_CARE_PROVIDER_SITE_OTHER): Payer: Medicare Other | Admitting: Family Medicine

## 2020-06-26 DIAGNOSIS — U071 COVID-19: Secondary | ICD-10-CM

## 2020-06-26 DIAGNOSIS — R059 Cough, unspecified: Secondary | ICD-10-CM | POA: Diagnosis not present

## 2020-06-26 NOTE — Progress Notes (Signed)
MyChart Video Visit    Virtual Visit via Video Note   This visit type was conducted due to national recommendations for restrictions regarding the COVID-19 Pandemic (e.g. social distancing) in an effort to limit this patient's exposure and mitigate transmission in our community. This patient is at least at moderate risk for complications without adequate follow up. This format is felt to be most appropriate for this patient at this time. Physical exam was limited by quality of the video and audio technology used for the visit.   Patient location: Home Provider location: Office  I discussed the limitations of evaluation and management by telemedicine and the availability of in person appointments. The patient expressed understanding and agreed to proceed.  Patient: Jacob Montes   DOB: [birth year]   80 y.o. Male  MRN: [id number] Visit Date: 06/26/2020  Today's healthcare provider: [name], MD   Chief Complaint  Patient presents with  . Covid Positive   Subjective    HPI  Patient presents today with positive Covid test. He reports that he tested positive this morning. He c/o sinus congestion, cough, and chest tightness. He also mentions running a low grade fever last night. He has only taken Tylenol for his symptoms. No known exposure. Just had 2nd  covid booster on 06/20/2020.  T-max today 100.9, T-max yesterday 100.8.     Medications: Outpatient Medications Prior to Visit  Medication Sig  . amLODipine (NORVASC) 10 MG tablet Take 1 tablet (10 mg total) by mouth daily.  Marland Kitchen aspirin 81 MG chewable tablet Chew 81 mg by mouth daily.  Marland Kitchen docusate sodium (COLACE) 250 MG capsule Take 250 mg by mouth daily as needed for constipation.   Marland Kitchen doxycycline (VIBRA-TABS) 100 MG tablet Take 1 tablet (100 mg total) by mouth 2 (two) times daily.  Marland Kitchen doxycycline (VIBRA-TABS) 100 MG tablet Take 1 tablet (100 mg total) by mouth 2 (two) times daily for 5 days.  . furosemide (LASIX) 40 MG tablet  Take 1 tablet (40 mg total) by mouth daily as needed.  . metoprolol tartrate (LOPRESSOR) 25 MG tablet TAKE 1 & 1/2 (ONE & ONE-HALF) TABLETS BY MOUTH TWICE DAILY  . montelukast (SINGULAIR) 10 MG tablet Take 1 tablet (10 mg total) by mouth at bedtime as needed (allergies).  . Multiple Vitamin (MULTIVITAMIN) tablet Take 1 tablet by mouth daily.  Marland Kitchen omeprazole (PRILOSEC) 20 MG capsule Take 20 mg by mouth daily. As needed  . rosuvastatin (CRESTOR) 20 MG tablet Take 1 tablet by mouth once daily  . terbinafine (LAMISIL) 250 MG tablet Take 1 tablet (250 mg total) by mouth daily.  . Vitamins/Minerals TABS Take by mouth.    No facility-administered medications prior to visit.    Review of Systems     Objective    There were no vitals taken for this visit. BP Readings from Last 3 Encounters:  06/24/20 115/64  06/03/20 138/63  02/27/20 138/63   Wt Readings from Last 3 Encounters:  06/24/20 237 lb (107.5 kg)  06/03/20 240 lb (108.9 kg)  02/27/20 244 lb (110.7 kg)      Physical Exam     Assessment & Plan     1. COVID-19 COVID) section and patient is fully vaccinated.  Due to comorbidities will refer for consideration of outpatient therapy In the interim he has Robitussin-DM home which she will take twice a day.  Recommend vitamin C vitamin D and zinc daily. - Ambulatory referral for Covid Treatment  2. Cough Robitussin-DM. -  Ambulatory referral for Covid Treatment   No follow-ups on file.     I discussed the assessment and treatment plan with the patient. The patient was provided an opportunity to ask questions and all were answered. The patient agreed with the plan and demonstrated an understanding of the instructions.   The patient was advised to call back or seek an in-person evaluation if the symptoms worsen or if the condition fails to improve as anticipated.  I provided 12 minutes of non-face-to-face time during this encounter.  I, [name], MD, have reviewed  all documentation for this visit. The documentation on 06/26/20 for the exam, diagnosis, procedures, and orders are all accurate and complete.   Bassy Fetterly Cranford Mon, MD [facility] [phone] (phone) [phone] (fax)  [place]

## 2020-06-27 ENCOUNTER — Other Ambulatory Visit: Payer: Self-pay

## 2020-06-27 ENCOUNTER — Telehealth: Payer: Self-pay

## 2020-06-27 ENCOUNTER — Other Ambulatory Visit: Payer: Self-pay | Admitting: Physician Assistant

## 2020-06-27 MED ORDER — NIRMATRELVIR/RITONAVIR (PAXLOVID)TABLET
3.0000 | ORAL_TABLET | Freq: Two times a day (BID) | ORAL | 0 refills | Status: AC
  Filled 2020-06-27: qty 30, 5d supply, fill #0

## 2020-06-27 NOTE — Progress Notes (Signed)
Outpatient Oral COVID Treatment Note  I connected with Jacob Montes on 06/27/2020/11:42 AM by telephone and verified that I am speaking with the correct person using two identifiers.  I discussed the limitations, risks, security, and privacy concerns of performing an evaluation and management service by telephone and the availability of in person appointments. I also discussed with the patient that there may be a patient responsible charge related to this service. The patient expressed understanding and agreed to proceed.  Patient location: Home Provider location: Home  Diagnosis: COVID-19 infection  Purpose of visit: Discussion of potential use of Molnupiravir or Paxlovid, a new treatment for mild to moderate COVID-19 viral infection in non-hospitalized patients.   Subjective: Patient is a 80 y.o. male who has been diagnosed with COVID 19 viral infection.  Their symptoms began on 06/24/20 with cough, congestion, sore throat.   Past Medical History:  Diagnosis Date  . Aortic stenosis    a. s/p TAVR 03/2017  . CAD (coronary artery disease)    a. s/p 3-v CABG in 07/2007  . Carotid artery disease ([place])    a. s/p left sided CEA complicated by RLN parlaysis   . Cataract   . DVT (deep venous thrombosis) ([place]) 08/2010  . 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 GERD (gastroesophageal reflux disease)   . HLD (hyperlipidemia)   . Hyperlipidemia 10/15/2015  . Hypertension   . Nephrolithiasis 11/16/2018  . Pulmonary embolism ([place]) 08/2010  . Skin cancer   . Sleep apnea   . Stroke [facility])     Allergies  Allergen Reactions  . Atorvastatin Other (See Comments)    aches  . Ramipril Other (See Comments)    cough     Current Outpatient Medications:  .  amLODipine (NORVASC) 10 MG tablet, Take 1 tablet (10 mg total) by mouth daily., Disp: 90 tablet, Rfl: 3 .  aspirin 81 MG chewable tablet, Chew 81 mg by mouth daily., Disp: , Rfl:  .  docusate sodium (COLACE) 250 MG capsule, Take 250 mg by mouth  daily as needed for constipation. , Disp: , Rfl:  .  doxycycline (VIBRA-TABS) 100 MG tablet, Take 1 tablet (100 mg total) by mouth 2 (two) times daily., Disp: 14 tablet, Rfl: 0 .  doxycycline (VIBRA-TABS) 100 MG tablet, Take 1 tablet (100 mg total) by mouth 2 (two) times daily for 5 days., Disp: 10 tablet, Rfl: 0 .  furosemide (LASIX) 40 MG tablet, Take 1 tablet (40 mg total) by mouth daily as needed., Disp: 90 tablet, Rfl: 3 .  metoprolol tartrate (LOPRESSOR) 25 MG tablet, TAKE 1 & 1/2 (ONE & ONE-HALF) TABLETS BY MOUTH TWICE DAILY, Disp: 270 tablet, Rfl: 0 .  montelukast (SINGULAIR) 10 MG tablet, Take 1 tablet (10 mg total) by mouth at bedtime as needed (allergies)., Disp: 30 tablet, Rfl: 3 .  Multiple Vitamin (MULTIVITAMIN) tablet, Take 1 tablet by mouth daily., Disp: , Rfl:  .  omeprazole (PRILOSEC) 20 MG capsule, Take 20 mg by mouth daily. As needed, Disp: , Rfl:  .  rosuvastatin (CRESTOR) 20 MG tablet, Take 1 tablet by mouth once daily, Disp: 90 tablet, Rfl: 3 .  terbinafine (LAMISIL) 250 MG tablet, Take 1 tablet (250 mg total) by mouth daily., Disp: 30 tablet, Rfl: 0 .  Vitamins/Minerals TABS, Take by mouth. , Disp: , Rfl:   Objective: Patient appears/sounds Sick.  They are in no apparent distress.  Breathing is non labored.  Mood and behavior are normal.  Laboratory Data:  Recent Results (from the  past 2160 hour(s))  POCT urinalysis dipstick     Status: None   Collection Time: 06/03/20  2:10 PM  Result Value Ref Range   Color, UA yellow    Clarity, UA clear    Glucose, UA Negative Negative   Bilirubin, UA negative    Ketones, UA negative    Spec Grav, UA 1.015 1.010 - 1.025   Blood, UA negative    pH, UA 6.0 5.0 - 8.0   Protein, UA Negative Negative   Urobilinogen, UA 0.2 0.2 or 1.0 E.U./dL   Nitrite, UA negative    Leukocytes, UA Negative Negative  Hemoglobin A1c     Status: Abnormal   Collection Time: 06/03/20  2:12 PM  Result Value Ref Range   Hgb A1c MFr Bld 5.8 (H) 4.8 -  5.6 %    Comment:          Prediabetes: 5.7 - 6.4          Diabetes: >6.4          Glycemic control for adults with diabetes: <7.0    Est. average glucose Bld gHb Est-mCnc 120 mg/dL  TSH     Status: None   Collection Time: 06/03/20  2:12 PM  Result Value Ref Range   TSH 0.701 0.450 - 4.500 uIU/mL  Lipid panel     Status: None   Collection Time: 06/03/20  2:12 PM  Result Value Ref Range   Cholesterol, Total 135 100 - 199 mg/dL   Triglycerides 114 0 - 149 mg/dL   HDL 47 >39 mg/dL   VLDL Cholesterol Cal 21 5 - 40 mg/dL   LDL Chol Calc (NIH) 67 0 - 99 mg/dL   Chol/HDL Ratio 2.9 0.0 - 5.0 ratio    Comment:                                   T. Chol/HDL Ratio                                             Men  Women                               1/2 Avg.Risk  3.4    3.3                                   Avg.Risk  5.0    4.4                                2X Avg.Risk  9.6    7.1                                3X Avg.Risk 23.4   11.0   CBC w/Diff/Platelet     Status: Abnormal   Collection Time: 06/03/20  2:12 PM  Result Value Ref Range   WBC 7.9 3.4 - 10.8 x10E3/uL   RBC 4.47 4.14 - 5.80 x10E6/uL   Hemoglobin 14.5 13.0 - 17.7 g/dL   Hematocrit 42.6 37.5 - 51.0 %  MCV 95 79 - 97 fL   MCH 32.4 26.6 - 33.0 pg   MCHC 34.0 31.5 - 35.7 g/dL   RDW 13.1 11.6 - 15.4 %   Platelets 106 (L) 150 - 450 x10E3/uL   Neutrophils 50 Not Estab. %   Lymphs 40 Not Estab. %   Monocytes 8 Not Estab. %   Eos 2 Not Estab. %   Basos 0 Not Estab. %   Neutrophils Absolute 3.9 1.4 - 7.0 x10E3/uL   Lymphocytes Absolute 3.2 (H) 0.7 - 3.1 x10E3/uL   Monocytes Absolute 0.6 0.1 - 0.9 x10E3/uL   EOS (ABSOLUTE) 0.2 0.0 - 0.4 x10E3/uL   Basophils Absolute 0.0 0.0 - 0.2 x10E3/uL   Immature Granulocytes 0 Not Estab. %   Immature Grans (Abs) 0.0 0.0 - 0.1 x10E3/uL  Comprehensive Metabolic Panel (CMET)     Status: Abnormal   Collection Time: 06/03/20  2:12 PM  Result Value Ref Range   Glucose 86 65 - 99 mg/dL   BUN  15 8 - 27 mg/dL   Creatinine, Ser 0.84 0.76 - 1.27 mg/dL   eGFR 89 >59 mL/min/1.73   BUN/Creatinine Ratio 18 10 - 24   Sodium 142 134 - 144 mmol/L   Potassium 4.7 3.5 - 5.2 mmol/L   Chloride 104 96 - 106 mmol/L   CO2 24 20 - 29 mmol/L   Calcium 9.1 8.6 - 10.2 mg/dL   Total Protein 6.6 6.0 - 8.5 g/dL   Albumin 4.3 3.7 - 4.7 g/dL   Globulin, Total 2.3 1.5 - 4.5 g/dL   Albumin/Globulin Ratio 1.9 1.2 - 2.2   Bilirubin Total 1.5 (H) 0.0 - 1.2 mg/dL   Alkaline Phosphatase 49 44 - 121 IU/L   AST 22 0 - 40 IU/L   ALT 21 0 - 44 IU/L     Assessment: 80 y.o. male with mild/moderate COVID 19 viral infection diagnosed on 5/17  at high risk for progression to severe COVID 19.  Plan:  This patient is a 80 y.o. male that meets the following criteria for Emergency Use Authorization of: Paxlovid 1. Age >12 yr AND > 40 kg 2. SARS-COV-2 positive test 3. Symptom onset < 5 days 4. Mild-to-moderate COVID disease with high risk for severe progression to hospitalization or death  I have spoken and communicated the following to the patient or parent/caregiver regarding: 1. Paxlovid is an unapproved drug that is authorized for use under an Emergency Use Authorization.  2. There are no adequate, approved, available products for the treatment of COVID-19 in adults who have mild-to-moderate COVID-19 and are at high risk for progressing to severe COVID-19, including hospitalization or death. 3. Other therapeutics are currently authorized. For additional information on all products authorized for treatment or prevention of COVID-19, please see [email or web address].  4. There are benefits and risks of taking this treatment as outlined in the "Fact Sheet for Patients and Caregivers."  5. "Fact Sheet for Patients and Caregivers" was reviewed with patient. A hard copy will be provided to patient from pharmacy prior to  the patient receiving treatment. 6. Patients should continue to self-isolate and use infection control measures (e.g., wear mask, isolate, social distance, avoid sharing personal items, clean and disinfect "high touch" surfaces, and frequent handwashing) according to CDC guidelines.  7. The patient or parent/caregiver has the option to accept or refuse treatment. 8. Patient medication history was reviewed for potential drug interactions:Interaction with home meds: Amlodipine and Crestor (will hold) 9. Patient's GFR was calculated to be >  60, and they were therefore prescribed Normal dose (GFR>60) - nirmatrelvir 126m tab (2 tablet) by mouth twice daily AND ritonavir 1023mtab (1 tablet) by mouth twice daily   After reviewing above information with the patient, the patient agrees to receive Paxlovid.  Follow up instructions:    . Take prescription BID x 5 days as directed . Reach out to pharmacist for counseling on medication if desired . For concerns regarding further COVID symptoms please follow up with your PCP or urgent care . For urgent or life-threatening issues, seek care at your local emergency department  The patient was provided an opportunity to ask questions, and all were answered. The patient agreed with the plan and demonstrated an understanding of the instructions.   [place]  The patient was advised to call their PCP or seek an in-person evaluation if the symptoms worsen or if the condition fails to improve as anticipated.   I provided 20 minutes of non face-to-face telephone visit time during this encounter, and > 50% was spent counseling as documented under my assessment & plan.  [place]/19/2022 /11:42 AM

## 2020-06-27 NOTE — Telephone Encounter (Signed)
Called to discuss with patient about COVID-19 symptoms and the use of one of the available treatments for those with mild to moderate Covid symptoms and at a high risk of hospitalization.  Pt appears to qualify for outpatient treatment due to co-morbid conditions and/or a member of an at-risk group in accordance with the FDA Emergency Use Authorization.    Symptom onset: 06/24/20 Cough, fever Vaccinated: Yes Booster? Yes Immunocompromised? No Qualifiers: CAD,HTN NIH Criteria: Tier 1   Pt. Would like to speak with APP.  Jacob Montes

## 2020-07-03 ENCOUNTER — Other Ambulatory Visit: Payer: Self-pay | Admitting: Dermatology

## 2020-07-03 ENCOUNTER — Ambulatory Visit: Payer: Medicare Other | Admitting: Dermatology

## 2020-07-03 DIAGNOSIS — B351 Tinea unguium: Secondary | ICD-10-CM

## 2020-07-05 ENCOUNTER — Other Ambulatory Visit: Payer: Self-pay | Admitting: Dermatology

## 2020-07-05 DIAGNOSIS — B351 Tinea unguium: Secondary | ICD-10-CM

## 2020-07-10 ENCOUNTER — Telehealth: Payer: Self-pay

## 2020-07-10 DIAGNOSIS — B351 Tinea unguium: Secondary | ICD-10-CM

## 2020-07-10 NOTE — Telephone Encounter (Signed)
Pts wife called today requesting a refill of terbinafine. Pt had to reschedule f/u appt on 07/03/20 due to having Covid. Pt denies any side effects from the terbinafine and states that the toe nail is starting to grow out normal. He rescheduled his f/u to 09/02/20. Please advise.

## 2020-07-11 MED ORDER — TERBINAFINE HCL 250 MG PO TABS: 250.0000 mg | ORAL_TABLET | Freq: Every day | ORAL | 0 refills | Status: DC

## 2020-07-11 NOTE — Telephone Encounter (Signed)
May give 1 rf for Lamisil 250 mg 1 po qd #30 Keep current July fu appt

## 2020-07-11 NOTE — Telephone Encounter (Signed)
Left message for pt informing of refill being approved and sent to the pharmacy.

## 2020-07-11 NOTE — Addendum Note (Signed)
Addended by: Harriett Sine on: 07/11/2020 09:49 AM   Modules accepted: Orders

## 2020-08-07 ENCOUNTER — Other Ambulatory Visit: Payer: Self-pay | Admitting: Dermatology

## 2020-08-07 DIAGNOSIS — B351 Tinea unguium: Secondary | ICD-10-CM

## 2020-08-10 ENCOUNTER — Other Ambulatory Visit: Payer: Self-pay | Admitting: Dermatology

## 2020-08-10 DIAGNOSIS — B351 Tinea unguium: Secondary | ICD-10-CM

## 2020-08-15 ENCOUNTER — Other Ambulatory Visit: Payer: Self-pay | Admitting: Dermatology

## 2020-08-15 DIAGNOSIS — B351 Tinea unguium: Secondary | ICD-10-CM

## 2020-08-19 ENCOUNTER — Encounter: Payer: Self-pay | Admitting: Family Medicine

## 2020-08-19 ENCOUNTER — Ambulatory Visit (INDEPENDENT_AMBULATORY_CARE_PROVIDER_SITE_OTHER): Payer: Medicare Other | Admitting: Family Medicine

## 2020-08-19 ENCOUNTER — Other Ambulatory Visit: Payer: Self-pay

## 2020-08-19 VITALS — BP 117/62 | HR 62 | Temp 97.7°F | Resp 16 | Wt 240.0 lb

## 2020-08-19 DIAGNOSIS — N401 Enlarged prostate with lower urinary tract symptoms: Secondary | ICD-10-CM

## 2020-08-19 DIAGNOSIS — R3589 Other polyuria: Secondary | ICD-10-CM

## 2020-08-19 DIAGNOSIS — G4733 Obstructive sleep apnea (adult) (pediatric): Secondary | ICD-10-CM

## 2020-08-19 DIAGNOSIS — R351 Nocturia: Secondary | ICD-10-CM | POA: Diagnosis not present

## 2020-08-19 DIAGNOSIS — I10 Essential (primary) hypertension: Secondary | ICD-10-CM

## 2020-08-19 LAB — POCT URINALYSIS DIPSTICK
Bilirubin, UA: NEGATIVE
Blood, UA: NEGATIVE
Glucose, UA: NEGATIVE
Leukocytes, UA: NEGATIVE
Nitrite, UA: NEGATIVE
Protein, UA: NEGATIVE
Spec Grav, UA: >=1.03 — AB (ref 1.010–1.025)
Urobilinogen, UA: 0.2 E.U./dL
pH, UA: 5 (ref 5.0–8.0)

## 2020-08-19 MED ORDER — TAMSULOSIN HCL 0.4 MG PO CAPS: 0.4000 mg | ORAL_CAPSULE | Freq: Every day | ORAL | 3 refills | Status: DC

## 2020-08-19 NOTE — Progress Notes (Signed)
I,Jacob Montes,acting as a scribe for Jacob Durie, MD.,have documented all relevant documentation on the behalf of Jacob Durie, MD,as directed by  Jacob Durie, MD while in the presence of Jacob Durie, MD.   Established patient visit   Patient: Jacob Montes   DOB: [birth year]   80 y.o. Male  MRN: [id number] Visit Date: 08/19/2020  Today's healthcare provider: [name], MD   Chief Complaint  Patient presents with   Urinary Frequency   Subjective    HPI  Patient is here concerning frequent night time urination. Patient states he gets up to urinate up to 4 times a night.  This is been going on for at least a couple of years but is starting to bother him. No other urinary symptoms.  He may have some urgency at times. It is of note that he had the same symptoms when he was diagnosed with sleep apnea and it got better with CPAP but he was unable to tolerate the CPAP.  This was back in 2012. Home blood pressure readings are brought in and are in the 120s to 140s with mainly being 120s and 130s     Medications: Outpatient Medications Prior to Visit  Medication Sig   amLODipine (NORVASC) 10 MG tablet Take 1 tablet (10 mg total) by mouth daily.   aspirin 81 MG chewable tablet Chew 81 mg by mouth daily.   docusate sodium (COLACE) 250 MG capsule Take 250 mg by mouth daily as needed for constipation.    furosemide (LASIX) 40 MG tablet Take 1 tablet (40 mg total) by mouth daily as needed.   metoprolol tartrate (LOPRESSOR) 25 MG tablet TAKE 1 & 1/2 (ONE & ONE-HALF) TABLETS BY MOUTH TWICE DAILY   montelukast (SINGULAIR) 10 MG tablet Take 1 tablet (10 mg total) by mouth at bedtime as needed (allergies).   Multiple Vitamin (MULTIVITAMIN) tablet Take 1 tablet by mouth daily.   omeprazole (PRILOSEC) 20 MG capsule Take 20 mg by mouth daily. As needed   rosuvastatin (CRESTOR) 20 MG tablet Take 1 tablet by mouth once daily   terbinafine (LAMISIL) 250 MG tablet  Take 1 tablet (250 mg total) by mouth daily.   Vitamins/Minerals TABS Take by mouth.    doxycycline (VIBRA-TABS) 100 MG tablet Take 1 tablet (100 mg total) by mouth 2 (two) times daily. (Patient not taking: Reported on 08/19/2020)   No facility-administered medications prior to visit.    Review of Systems  Constitutional:  Negative for appetite change, chills and fever.  Respiratory:  Negative for chest tightness, shortness of breath and wheezing.   Cardiovascular:  Negative for chest pain and palpitations.  Gastrointestinal:  Negative for abdominal pain, nausea and vomiting.  Endocrine: Positive for polyuria.       Objective    BP 117/62 (BP Location: Left Arm, Patient Position: Sitting, Cuff Size: Large)   Pulse 62   Temp 97.7 F (36.5 C) (Oral)   Resp 16   Wt 240 lb (108.9 kg)   SpO2 98%   BMI 38.74 kg/m  BP Readings from Last 3 Encounters:  08/19/20 117/62  06/24/20 115/64  06/03/20 138/63   Wt Readings from Last 3 Encounters:  08/19/20 240 lb (108.9 kg)  06/24/20 237 lb (107.5 kg)  06/03/20 240 lb (108.9 kg)       Physical Exam Vitals reviewed.  Constitutional:      Appearance: Normal appearance.  HENT:     Head: Normocephalic and atraumatic.  Right Ear: External ear normal.     Left Ear: External ear normal.  Eyes:     General: No scleral icterus.    Conjunctiva/sclera: Conjunctivae normal.  Cardiovascular:     Rate and Rhythm: Normal rate and regular rhythm.     Pulses: Normal pulses.     Heart sounds: Normal heart sounds.     Comments: No carotid bruit Pulmonary:     Effort: Pulmonary effort is normal.     Breath sounds: Normal breath sounds.  Musculoskeletal:     Right lower leg: No edema.     Left lower leg: No edema.     Comments: Trace lower extremity edema  Skin:    General: Skin is warm and dry.  Neurological:     General: No focal deficit present.     Mental Status: He is alert and oriented to person, place, and time.  Psychiatric:         Mood and Affect: Mood normal.        Behavior: Behavior normal.        Thought Content: Thought content normal.        Judgment: Judgment normal.      Results for orders placed or performed in visit on 08/19/20  POCT urinalysis dipstick  Result Value Ref Range   Color, UA Dark Yellow    Clarity, UA Clear    Glucose, UA Negative Negative   Bilirubin, UA Negative    Ketones, UA Trace    Spec Grav, UA >=1.030 (A) 1.010 - 1.025   Blood, UA Negative    pH, UA 5.0 5.0 - 8.0   Protein, UA Negative Negative   Urobilinogen, UA 0.2 0.2 or 1.0 E.U./dL   Nitrite, UA Negative    Leukocytes, UA Negative Negative    Assessment & Plan     1. Polyuria Normal urinalysis. - POCT urinalysis dipstick  2. Benign prostatic hyperplasia with nocturia Urology consult in the future if patient wishes.  Not necessary at this time. - tamsulosin (FLOMAX) 0.4 MG CAPS capsule; Take 1 capsule (0.4 mg total) by mouth daily.  Dispense: 30 capsule; Refill: 3  3. Nocturia Could be BPH but I think this is mainly sleep apnea as etiology of the symptoms. - tamsulosin (FLOMAX) 0.4 MG CAPS capsule; Take 1 capsule (0.4 mg total) by mouth daily.  Dispense: 30 capsule; Refill: 3  4. OSA (obstructive sleep apnea) Patient was unable to tolerate CPAP.  We will see how the Flomax goes and will consider referral to sleep specialist for CPAP options or 9 CPAP options for treatment.  The olmesartan will need another sleep study. 5.  Hypertension Fairly good control.  Return in about 3 months (around 11/19/2020).      I, Jacob Durie, MD, have reviewed all documentation for this visit. The documentation on 08/19/20 for the exam, diagnosis, procedures, and orders are all accurate and complete.    Jeannene Tschetter Cranford Mon, MD  [facility] [phone] (phone) [phone] (fax)  [place]

## 2020-08-21 ENCOUNTER — Ambulatory Visit: Payer: Medicare Other | Admitting: Cardiovascular Disease

## 2020-08-21 ENCOUNTER — Other Ambulatory Visit: Payer: Self-pay

## 2020-08-21 ENCOUNTER — Encounter: Payer: Self-pay | Admitting: Cardiovascular Disease

## 2020-08-21 VITALS — BP 120/60 | HR 47 | Ht 66.0 in | Wt 242.4 lb

## 2020-08-21 DIAGNOSIS — I350 Nonrheumatic aortic (valve) stenosis: Secondary | ICD-10-CM

## 2020-08-21 DIAGNOSIS — I10 Essential (primary) hypertension: Secondary | ICD-10-CM

## 2020-08-21 DIAGNOSIS — Z952 Presence of prosthetic heart valve: Secondary | ICD-10-CM

## 2020-08-21 DIAGNOSIS — I2510 Atherosclerotic heart disease of native coronary artery without angina pectoris: Secondary | ICD-10-CM | POA: Diagnosis not present

## 2020-08-21 DIAGNOSIS — I25118 Atherosclerotic heart disease of native coronary artery with other forms of angina pectoris: Secondary | ICD-10-CM

## 2020-08-21 DIAGNOSIS — I479 Paroxysmal tachycardia, unspecified: Secondary | ICD-10-CM | POA: Diagnosis not present

## 2020-08-21 MED ORDER — METOPROLOL TARTRATE 25 MG PO TABS: 25.0000 mg | ORAL_TABLET | Freq: Two times a day (BID) | ORAL | 3 refills | Status: DC

## 2020-08-21 MED ORDER — FUROSEMIDE 40 MG PO TABS: 40.0000 mg | ORAL_TABLET | ORAL | 3 refills | Status: DC

## 2020-08-21 MED ORDER — POTASSIUM CHLORIDE ER 10 MEQ PO TBCR: 10.0000 meq | EXTENDED_RELEASE_TABLET | ORAL | 3 refills | Status: DC

## 2020-08-21 NOTE — Progress Notes (Signed)
Cardiology Office Note  Date:  08/21/2020   ID:  Jacob Montes, DOB 12-25-1940, MRN [id number]  PCP:  Jerrol Banana., MD   Chief Complaint  Patient presents with   6 month follow up     "Doing well." Medications reviewed by the patient verbally.     HPI:  [name] is a 80 year old gentleman with coronary artery disease, bypass surgery three-vessel June 2009,  aortic valve stenosis with TAVR July 2018,  carotid stenosis with carotid endarterectomy 2009,  DVT/PE 2012,  prior smoker,  sleep apnea, not on CPAP Seen in the hospital October 2020  with abdominal pain, Rigors, chills fever  Common bile duct stone 8 mm with transaminitis , had ERCP Demand ischemia in the setting of above Who presents to establish care in the [place] office for his coronary disease, aortic valve prosthesis/TAVR  Some SOB when walking Takes lasix once a week Has edema No regular exercise program, poor conditioning Denies anginal symptoms Denies having episodes of tachycardia  Weight up 5 pounds in 2 months  Heart rate slow rate 47 today, on metoprolol 37.5 BID No symptoms  EKG personally reviewed by myself on todays visit NSR rate 47 IVCD  Other past medical hx TEE October 2020, Ejection fraction 60 to 65%, well-functioning aortic valve prosthesis trivial regurg  Lab work reviewed HBA1C 5.6 Total chol 133  Last heart catheterization November 2018   PMH:   has a past medical history of Aortic stenosis, CAD (coronary artery disease), Carotid artery disease ([place]), Cataract, DVT (deep venous thrombosis) ([place]) (08/2010), 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  PSH:    Past Surgical History:  Procedure Laterality Date   Carotid surgery Left 03/08/2007   CHOLECYSTECTOMY   02/16/2018   ERCP N/A 11/18/2018   Procedure: ENDOSCOPIC RETROGRADE CHOLANGIOPANCREATOGRAPHY (ERCP);  Surgeon: [name], MD;  Location[facility] ENDOSCOPY;  Service: Endoscopy;  Laterality: N/A;   Heart Bypass N/A 07/2007   left shoulder surgery Left 06/03/2009   RIGHT AND LEFT HEART CATH  07/18/2007   right shoulder surgery  2003   TEE WITHOUT CARDIOVERSION N/A 11/21/2018   Procedure: TRANSESOPHAGEAL ECHOCARDIOGRAM (TEE);  Surgeon: Wellington Hampshire, MD;  Location: [facility] ORS;  Service: Cardiovascular;  Laterality: N/A;   TVAR      Current Outpatient Medications  Medication Sig Dispense Refill   amLODipine (NORVASC) 10 MG tablet Take 1 tablet (10 mg total) by mouth daily. 90 tablet 3   aspirin 81 MG chewable tablet Chew 81 mg by mouth daily.     docusate sodium (COLACE) 250 MG capsule Take 250 mg by mouth daily as needed for constipation.      furosemide (LASIX) 40 MG tablet Take 1 tablet (40 mg total) by mouth daily as needed. 90 tablet 3   metoprolol tartrate (LOPRESSOR) 25 MG tablet TAKE 1 & 1/2 (ONE & ONE-HALF) TABLETS BY MOUTH TWICE DAILY 270 tablet 0   montelukast (SINGULAIR) 10 MG tablet Take 1 tablet (10 mg total) by mouth at bedtime as needed (allergies). 30 tablet 3   Multiple Vitamin (MULTIVITAMIN) tablet Take 1 tablet by mouth daily.     omeprazole (PRILOSEC) 20 MG capsule Take 20 mg by mouth daily. As needed     rosuvastatin (CRESTOR) 20 MG tablet Take 1 tablet by mouth once daily 90 tablet 3   tamsulosin (FLOMAX) 0.4 MG CAPS capsule Take 1  capsule (0.4 mg total) by mouth daily. 30 capsule 3   terbinafine (LAMISIL) 250 MG tablet Take 1 tablet (250 mg total) by mouth daily. 30 tablet 0   Vitamins/Minerals TABS Take by mouth.      doxycycline (VIBRA-TABS) 100 MG tablet Take 1 tablet (100 mg total) by mouth 2 (two) times daily. (Patient not taking: No sig reported) 14 tablet 0   No current facility-administered medications for this visit.    Allergies:   Atorvastatin and  Ramipril   Social History:  The patient  reports that he has quit smoking. His smoking use included cigarettes. He has never used smokeless tobacco. He reports that he does not drink alcohol and does not use drugs.   Family History:   family history includes Hypertension in his mother; Lung cancer in his father; Non-Hodgkin's lymphoma in his brother; Stroke in his mother.    Review of Systems: Review of Systems  Constitutional: Negative.   HENT: Negative.    Respiratory: Negative.    Cardiovascular: Negative.   Gastrointestinal: Negative.   Musculoskeletal: Negative.   Neurological: Negative.   Psychiatric/Behavioral: Negative.    All other systems reviewed and are negative.  PHYSICAL EXAM: VS:  BP 120/60 (BP Location: Left Arm, Patient Position: Sitting, Cuff Size: Large)   Pulse (!) 47   Ht 5\' 6"  (1.676 m)   Wt 242 lb 6 oz (109.9 kg)   SpO2 98%   BMI 39.12 kg/m  , BMI Body mass index is 39.12 kg/m. Constitutional:  oriented to person, place, and time. No distress.  HENT:  Head: Grossly normal Eyes:  no discharge. No scleral icterus.  Neck: No JVD, no carotid bruits  Cardiovascular: Regular rate and rhythm, no murmurs appreciated Pulmonary/Chest: Clear to auscultation bilaterally, no wheezes or rails Abdominal: Soft.  no distension.  no tenderness.  Musculoskeletal: Normal range of motion Neurological:  normal muscle tone. Coordination normal. No atrophy Skin: Skin warm and dry Psychiatric: normal affect, pleasant  Recent Labs: 06/03/2020: ALT 21; BUN 15; Creatinine, Ser 0.84; Hemoglobin 14.5; Platelets 106; Potassium 4.7; Sodium 142; TSH 0.701    Lipid Panel Lab Results  Component Value Date   CHOL 135 06/03/2020   HDL 47 06/03/2020   LDLCALC 67 06/03/2020   TRIG 114 06/03/2020      Wt Readings from Last 3 Encounters:  08/21/20 242 lb 6 oz (109.9 kg)  08/19/20 240 lb (108.9 kg)  06/24/20 237 lb (107.5 kg)     ASSESSMENT AND PLAN:  Problem List Items  Addressed This Visit       Cardiology Problems   Aortic stenosis   Arteriosclerotic coronary artery disease   Other Visit Diagnoses     Paroxysmal tachycardia ([place])    -  Primary   Coronary artery disease of native artery of native heart with stable angina pectoris ([place])       History of transcatheter aortic valve replacement (TAVR)         Coronary artery disease with stable angina Blood pressure is well controlled on today's visit. No changes made to the medications.  Paroxysmal tachycardia Stable on metoprolol Metoprolol down to 25 BID for bradycardia  Aortic valve disease, stenosis S/p TAVR Echo end of 2022  Hyperlipidemia On Crestor 20 daily Cholesterol is at goal on the current lipid regimen. No changes to the medications were made.  Leg edema:  Lasix 40 mg up to 3x a week This should also help blood pressure  Total encounter time more than  25 minutes  Greater than 50% was spent in counseling and coordination of care with the patient   Signed, Esmond Plants, M.D., Ph.D. [place], [place]

## 2020-08-21 NOTE — Patient Instructions (Addendum)
Medication Instructions:  Lasix 40 mg   3x a week,  take with potassium 10 meq  metoprolol 25 mg twice a day Heart rate is slow  If you need a refill on your cardiac medications before your next appointment, please call your pharmacy.   Lab work: No new labs needed  Testing/Procedures: Echo end of 2022: TAVR, aortic valve disorder  Carotid u/s, carotid stenosis, s/p CEA   Follow-Up: At [facility], you and your health needs are our priority.  As part of our continuing mission to provide you with exceptional heart care, we have created designated Provider Care Teams.  These Care Teams include your primary Cardiologist (physician) and Advanced Practice Providers (APPs -  Physician Assistants and Nurse Practitioners) who all work together to provide you with the care you need, when you need it.  You will need a follow up appointment in 12 months  Providers on your designated Care Team:   Murray Hodgkins, NP Christell Faith, PA-C Marrianne Mood, PA-C Cadence Wrens, [place]  COVID-19 Vaccine Information can be found at: ShippingScam.co.uk For questions related to vaccine distribution or appointments, please email [email or web address] or call [phone].   Your physician has requested that you have an echocardiogram. Echocardiography is a painless test that uses sound waves to create images of your heart. It provides your doctor with information about the size and shape of your heart and how well your heart's chambers and valves are working. This procedure takes approximately one hour. There are no restrictions for this procedure.  There is a possibility that an IV may need to be started during your test to inject an image enhancing agent. This is done to obtain more optimal pictures of your heart. Therefore we ask that you do at least drink some water prior to coming in to hydrate your veins.

## 2020-08-30 ENCOUNTER — Other Ambulatory Visit: Payer: Self-pay | Admitting: Family Medicine

## 2020-08-30 DIAGNOSIS — I10 Essential (primary) hypertension: Secondary | ICD-10-CM

## 2020-09-02 ENCOUNTER — Encounter: Payer: Self-pay | Admitting: Dermatology

## 2020-09-02 ENCOUNTER — Ambulatory Visit: Payer: Medicare Other | Admitting: Dermatology

## 2020-09-02 ENCOUNTER — Other Ambulatory Visit: Payer: Self-pay

## 2020-09-02 DIAGNOSIS — L578 Other skin changes due to chronic exposure to nonionizing radiation: Secondary | ICD-10-CM | POA: Diagnosis not present

## 2020-09-02 DIAGNOSIS — L570 Actinic keratosis: Secondary | ICD-10-CM | POA: Diagnosis not present

## 2020-09-02 DIAGNOSIS — B351 Tinea unguium: Secondary | ICD-10-CM | POA: Diagnosis not present

## 2020-09-02 DIAGNOSIS — B353 Tinea pedis: Secondary | ICD-10-CM | POA: Diagnosis not present

## 2020-09-02 DIAGNOSIS — L820 Inflamed seborrheic keratosis: Secondary | ICD-10-CM

## 2020-09-02 MED ORDER — TERBINAFINE HCL 250 MG PO TABS
250.0000 mg | ORAL_TABLET | Freq: Every day | ORAL | 1 refills | Status: DC
Start: 2020-09-02 — End: 2021-01-15

## 2020-09-02 NOTE — Progress Notes (Signed)
   Follow-Up Visit   Subjective  Jacob Montes is a 80 y.o. male who presents for the following: Nail Problem (3 months f/u tinea pedis treated with Lamisil tablets x 2 months ).  He has other spots and growths to be evaluated.  Wife with patient contributing to history   The following portions of the chart were reviewed this encounter and updated as appropriate:   Tobacco  Allergies  Meds  Problems  Med Hx  Surg Hx  Fam Hx     Review of Systems:  No other skin or systemic complaints except as noted in HPI or Assessment and Plan.  Objective  Well appearing patient in no apparent distress; mood and affect are within normal limits.  A focused examination was performed including feet,toenails. Relevant physical exam findings are noted in the Assessment and Plan.  feet Scaling and maceration web spaces and over distal and lateral soles.   right forehead x 2 (2) Erythematous thin papules/macules with gritty scale.   Left Ear x 2, right ear x 1 (3) Erythematous keratotic or waxy stuck-on papule or plaque.    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  AK (actinic keratosis) (2) right forehead x 2  Destruction of lesion - right forehead x 2 Complexity: simple   Destruction method: cryotherapy   Informed consent: discussed and consent obtained   Timeout:  patient name, date of birth, surgical site, and procedure verified Lesion destroyed using liquid nitrogen: Yes   Region frozen until ice ball extended beyond lesion: Yes   Outcome: patient tolerated procedure well with no complications   Post-procedure details: wound care instructions given    Inflamed  seborrheic keratosis Left Ear x 2, right ear x 1  Destruction of lesion - Left Ear x 2, right ear x 1 Complexity: simple   Destruction method: cryotherapy   Informed consent: discussed and consent obtained   Timeout:  patient name, date of birth, surgical site, and procedure verified Lesion destroyed using liquid nitrogen: Yes   Region frozen until ice ball extended beyond lesion: Yes   Outcome: patient tolerated procedure well with no complications   Post-procedure details: wound care instructions given    Actinic Damage - chronic, secondary to cumulative UV radiation exposure/sun exposure over time - diffuse scaly erythematous macules with underlying dyspigmentation - Recommend daily broad spectrum sunscreen SPF 30+ to sun-exposed areas, reapply every 2 hours as needed.  - Recommend staying in the shade or wearing long sleeves, sun glasses (UVA+UVB protection) and wide brim hats (4-inch brim around the entire circumference of the hat). - Call for new or changing lesions.  Return in about 6 months (around 03/05/2021) for Aks, PDT on scalp-forehead in 2-4 weeks.  IMarye Round, CMA, am acting as scribe for Sarina Ser, MD .  Documentation: I have reviewed the above documentation for accuracy and completeness, and I agree with the above.  Sarina Ser, MD

## 2020-09-02 NOTE — Patient Instructions (Signed)

## 2020-09-03 ENCOUNTER — Ambulatory Visit: Payer: Medicare Other | Admitting: Family Medicine

## 2020-09-10 DIAGNOSIS — M79674 Pain in right toe(s): Secondary | ICD-10-CM | POA: Diagnosis not present

## 2020-09-10 DIAGNOSIS — M79675 Pain in left toe(s): Secondary | ICD-10-CM | POA: Diagnosis not present

## 2020-09-10 DIAGNOSIS — B351 Tinea unguium: Secondary | ICD-10-CM | POA: Diagnosis not present

## 2020-09-23 ENCOUNTER — Ambulatory Visit: Payer: Medicare Other

## 2020-09-23 ENCOUNTER — Other Ambulatory Visit: Payer: Self-pay

## 2020-09-23 DIAGNOSIS — L570 Actinic keratosis: Secondary | ICD-10-CM

## 2020-09-23 MED ORDER — AMINOLEVULINIC ACID HCL 20 % EX SOLR
1.0000 "application " | Freq: Once | CUTANEOUS | Status: AC
  Administered 2020-09-23: 354 mg via TOPICAL

## 2020-09-23 NOTE — Progress Notes (Signed)
1. AK (actinic keratosis) Scalp and Forehead  Photodynamic therapy - Scalp and Forehead Procedure discussed: discussed risks, benefits, side effects. and alternatives   Prep: site scrubbed/prepped with acetone   Location:  Scalp and Forehead Number of lesions:  Multiple Type of treatment:  Blue light Aminolevulinic Acid (see MAR for details): Levulan Number of Levulan sticks used:  1 Incubation time (minutes):  120 Number of minutes under lamp:  16 Number of seconds under lamp:  40 Cooling:  Floor fan Outcome: patient tolerated procedure well with no complications   Post-procedure details: sunscreen applied and aftercare instructions given to patient    Aminolevulinic Acid HCl 20 % SOLR 354 mg - Scalp and Forehead

## 2020-09-23 NOTE — Patient Instructions (Signed)

## 2020-10-03 DIAGNOSIS — Z961 Presence of intraocular lens: Secondary | ICD-10-CM | POA: Diagnosis not present

## 2020-10-20 IMAGING — US US ABDOMEN LIMITED
1 series · 14 of 25 positions shown · non-contrast
Comparison: None.

CLINICAL DATA: Right upper quadrant pain since yesterday

EXAM:
ULTRASOUND ABDOMEN LIMITED RIGHT UPPER QUADRANT

[Series 1: us abdomen limited · 0.24mm/px · 14 of 27 slices shown]
[im 1/27]
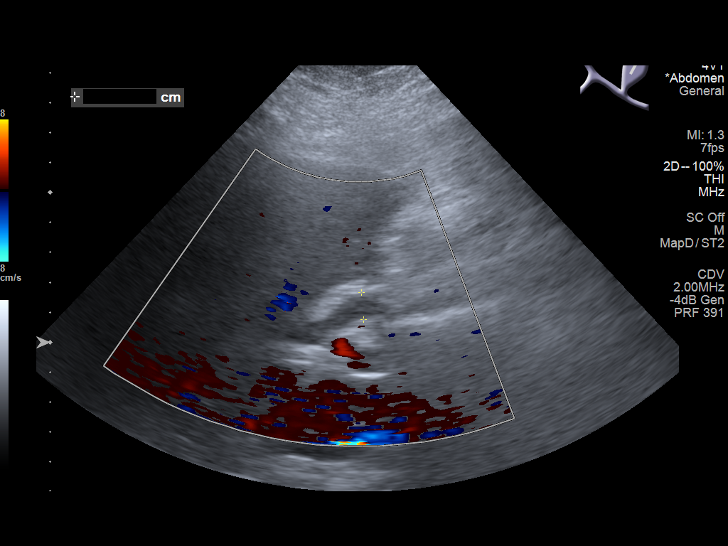
[im 3/27]
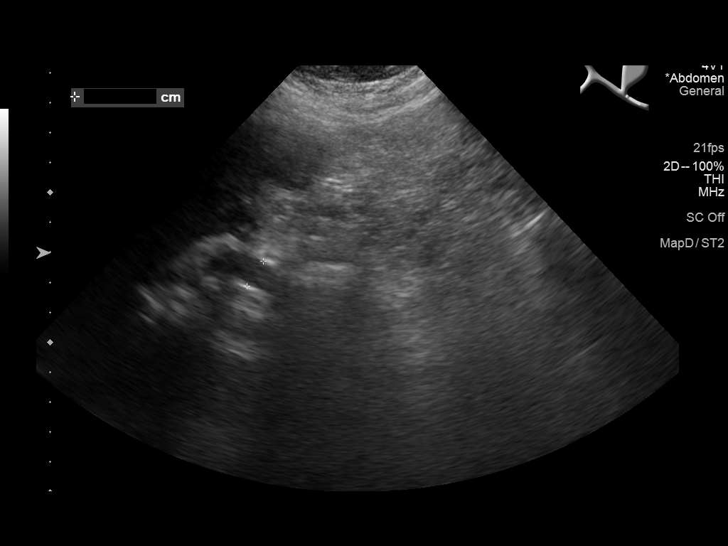
[im 5/27]
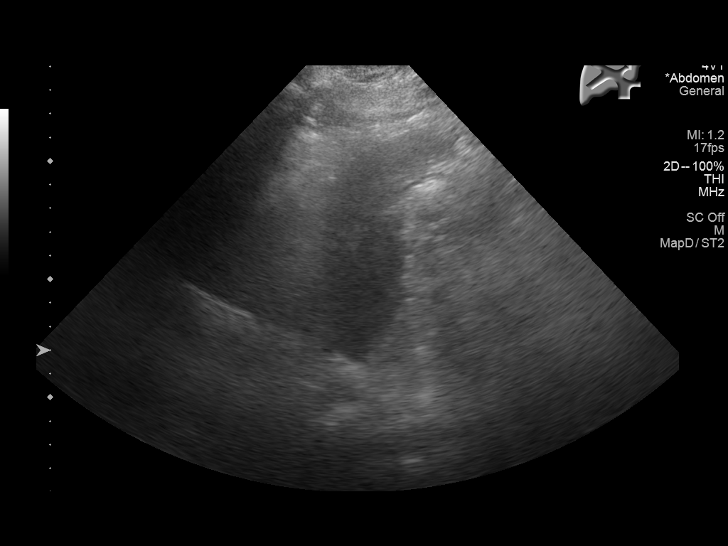
[im 7/27]
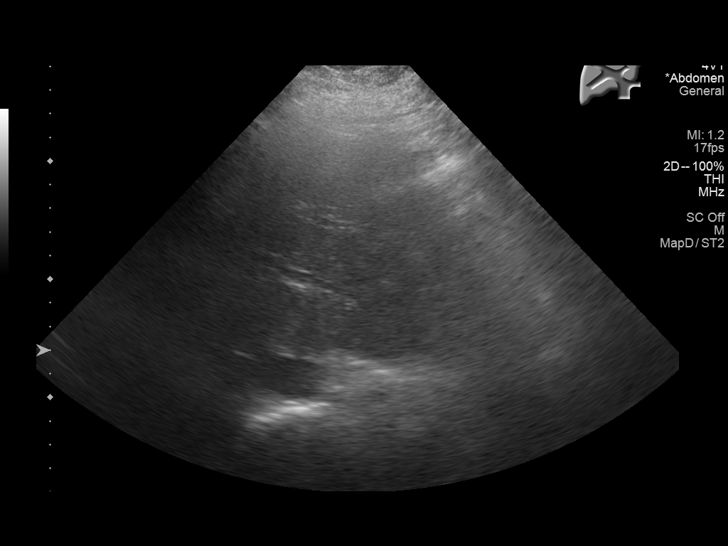
[im 9/27]
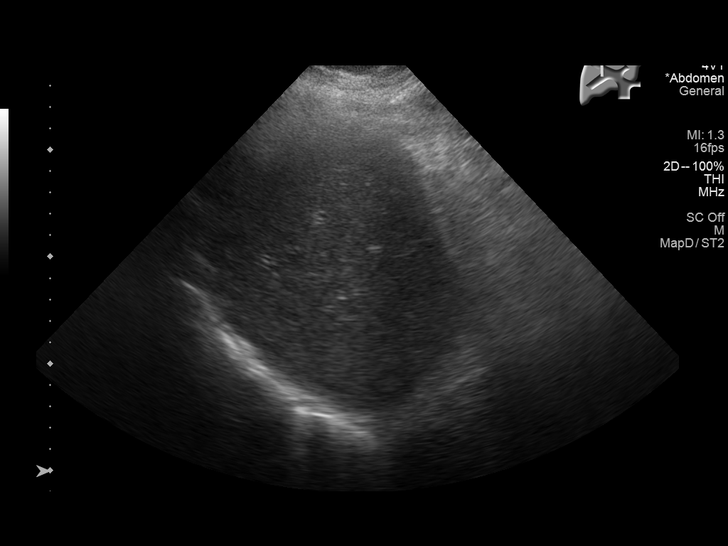
[im 10/27]
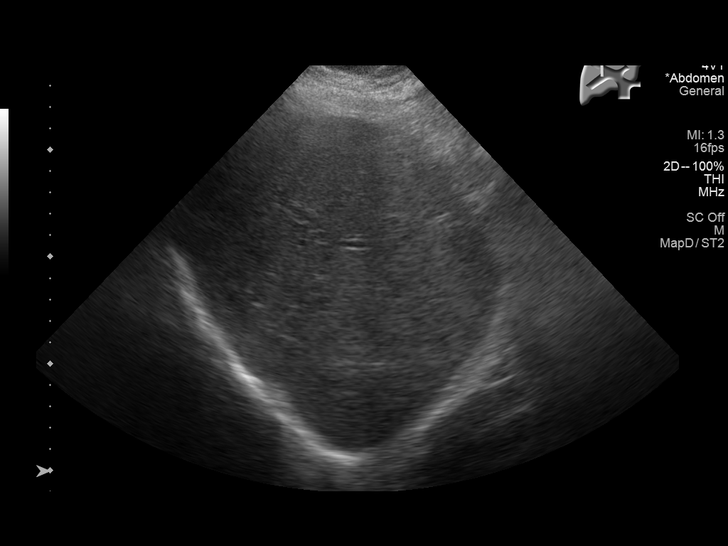
[im 12/27]
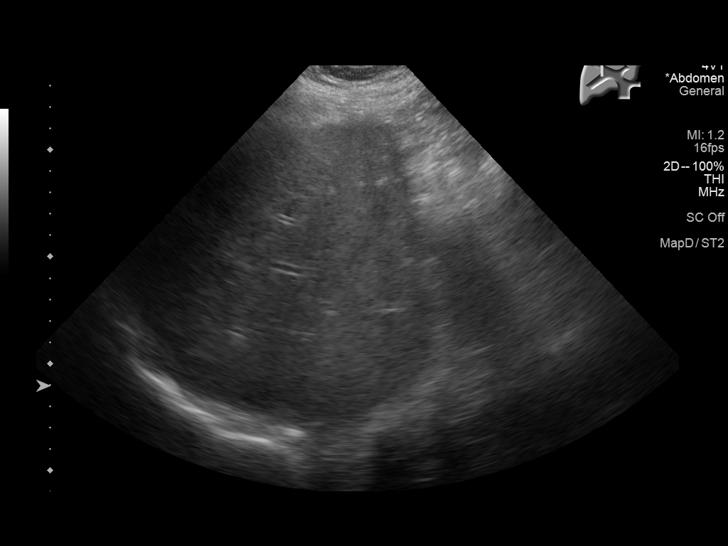
[im 15/27]
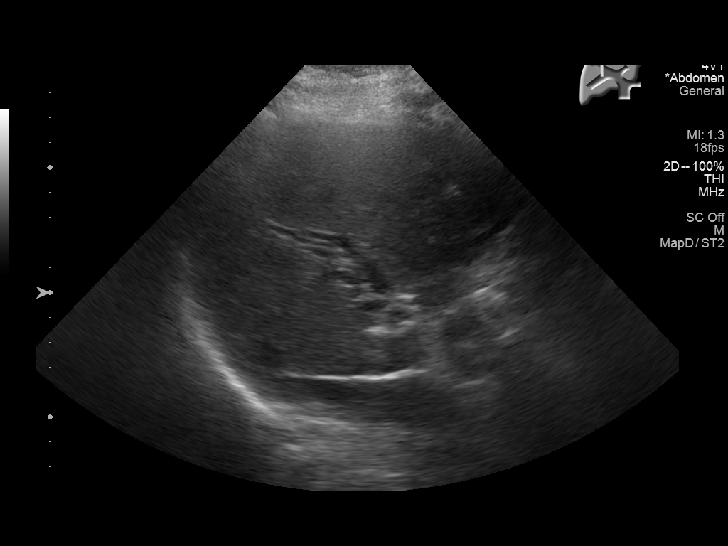
[im 17/27]
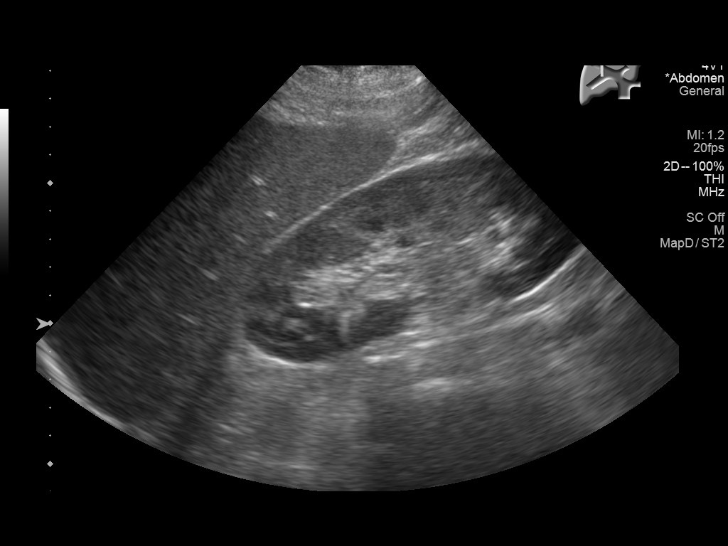
[im 18/27]
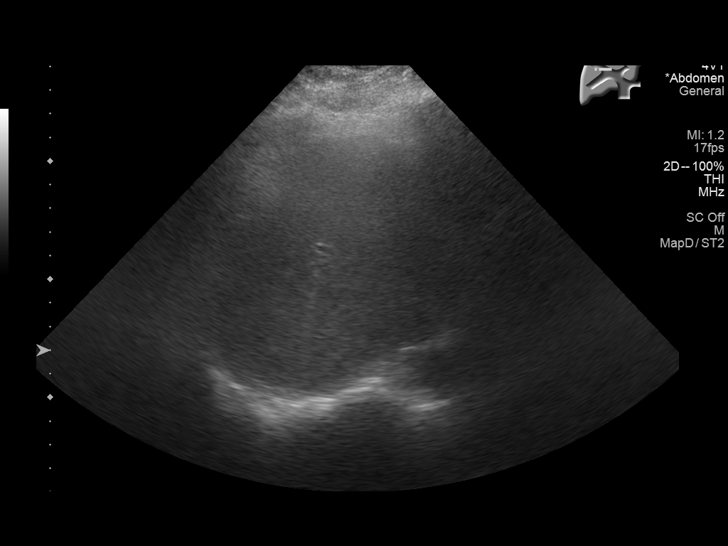
[im 20/27]
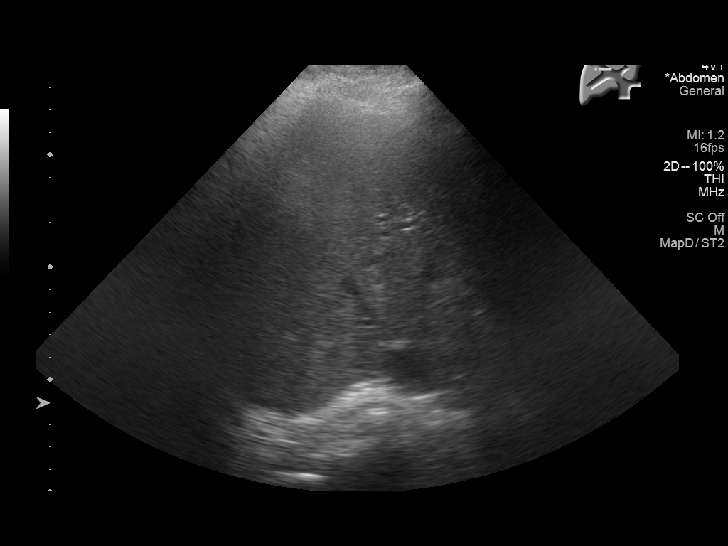
[im 22/27]
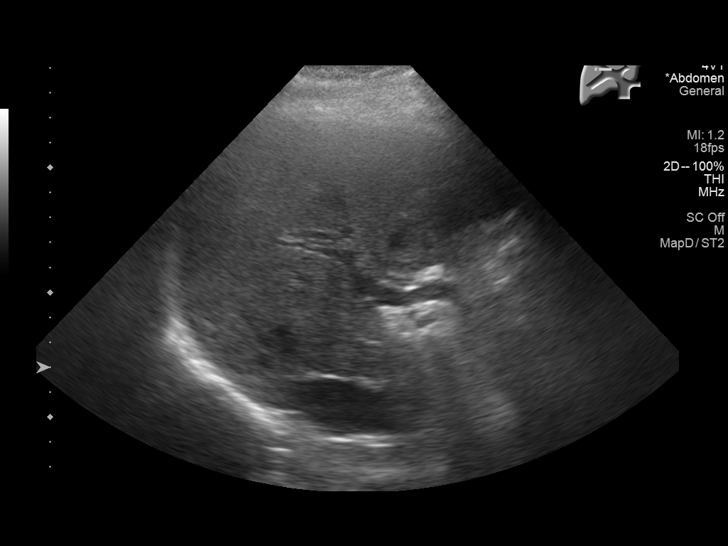
[im 24/27]
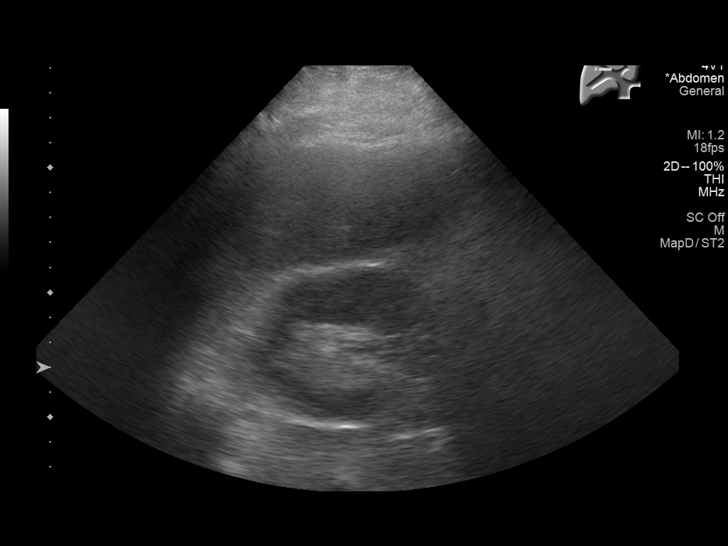
[im 27/27]
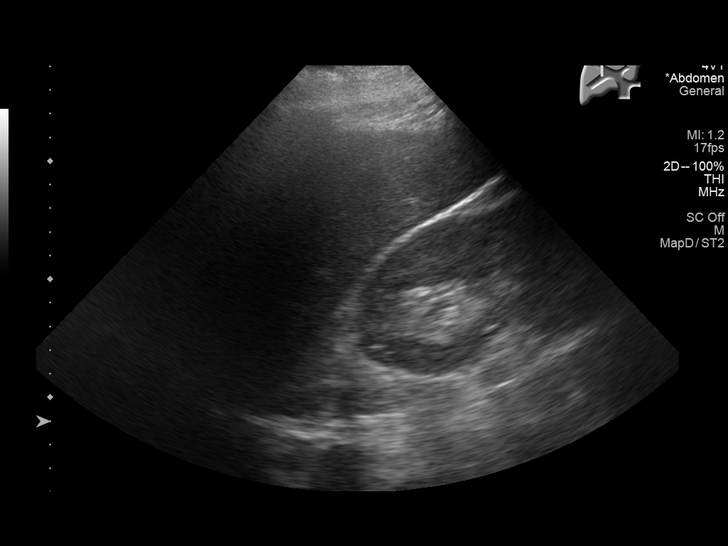

[14 of 25 positions shown; findings below may reference images not displayed]

FINDINGS: Gallbladder:

Surgically absent

Common bile duct:

Diameter: 1 cm.  The distal common bile duct is not visualized.

Liver:

Intrahepatic bile duct dilatation. No evidence of mass or
collection. Portal vein is patent on color Doppler imaging with
normal direction of blood flow towards the liver.
IMPRESSION: Intra and extrahepatic bile duct dilatation in the setting of known
choledocholithiasis by prior CT. The stone seen by CT is not visible
on this study due to shadowing bowel gas.

## 2020-10-21 ENCOUNTER — Telehealth: Payer: Self-pay

## 2020-10-21 NOTE — Telephone Encounter (Signed)
Copied from [place] [phone]. Topic: Appointment Scheduling - Scheduling Inquiry for Clinic >> Oct 21, 2020  9:54 AM Rayann Heman wrote: Reason for CRM: Pt called and stated that he having stomach pain and would like to be worked in the next couple of days. Please advise

## 2020-10-21 NOTE — Telephone Encounter (Signed)
Appt scheduled

## 2020-10-23 ENCOUNTER — Other Ambulatory Visit: Payer: Self-pay

## 2020-10-23 ENCOUNTER — Encounter: Payer: Self-pay | Admitting: Family Medicine

## 2020-10-23 ENCOUNTER — Ambulatory Visit (INDEPENDENT_AMBULATORY_CARE_PROVIDER_SITE_OTHER): Payer: Medicare Other | Admitting: Family Medicine

## 2020-10-23 VITALS — BP 132/44 | HR 46 | Temp 98.4°F | Resp 16 | Ht 66.0 in | Wt 241.0 lb

## 2020-10-23 DIAGNOSIS — K5792 Diverticulitis of intestine, part unspecified, without perforation or abscess without bleeding: Secondary | ICD-10-CM

## 2020-10-23 DIAGNOSIS — I10 Essential (primary) hypertension: Secondary | ICD-10-CM | POA: Diagnosis not present

## 2020-10-23 DIAGNOSIS — R109 Unspecified abdominal pain: Secondary | ICD-10-CM | POA: Diagnosis not present

## 2020-10-23 DIAGNOSIS — R1030 Lower abdominal pain, unspecified: Secondary | ICD-10-CM | POA: Diagnosis not present

## 2020-10-23 DIAGNOSIS — I25708 Atherosclerosis of coronary artery bypass graft(s), unspecified, with other forms of angina pectoris: Secondary | ICD-10-CM

## 2020-10-23 DIAGNOSIS — G4733 Obstructive sleep apnea (adult) (pediatric): Secondary | ICD-10-CM

## 2020-10-23 LAB — POCT URINALYSIS DIPSTICK
Bilirubin, UA: NEGATIVE
Glucose, UA: NEGATIVE
Nitrite, UA: NEGATIVE
Protein, UA: POSITIVE — AB
Spec Grav, UA: >=1.03 — AB (ref 1.010–1.025)
Urobilinogen, UA: 0.2 E.U./dL
pH, UA: 6.5 (ref 5.0–8.0)

## 2020-10-23 MED ORDER — DOXYCYCLINE HYCLATE 100 MG PO TABS: 100.0000 mg | ORAL_TABLET | Freq: Two times a day (BID) | ORAL | 0 refills | Status: DC

## 2020-10-23 NOTE — Progress Notes (Signed)
Established patient visit   Patient: Jacob Montes   DOB: 04/19/1940   80 y.o. Male  MRN: [id number] Visit Date: 10/23/2020  Today's healthcare provider: [name], MD   Chief Complaint  Patient presents with   Abdominal Pain   Subjective    Abdominal Pain The current episode started more than 1 month ago. The onset quality is gradual. The problem occurs intermittently. The pain is located in the suprapubic region. The pain is moderate. The quality of the pain is aching and a sensation of fullness. The abdominal pain does not radiate. Associated symptoms include a fever. Pertinent negatives include no belching, constipation, diarrhea, dysuria, frequency, hematochezia, hematuria, melena, nausea or vomiting. The pain is aggravated by certain positions. The pain is relieved by Nothing. He has tried nothing for the symptoms.    Patient reports that this is the same pain that he was seen for back in 06/2020. He was treated with Doxy, and reports that this helped. Last colonoscopy was in 2006 per patient's wife.  Had no weight loss melena hematochezia or change in bowel habits other than the above-mentioned symptoms.    Medications: Outpatient Medications Prior to Visit  Medication Sig   amLODipine (NORVASC) 10 MG tablet Take 1 tablet by mouth once daily   aspirin 81 MG chewable tablet Chew 81 mg by mouth daily.   docusate sodium (COLACE) 250 MG capsule Take 250 mg by mouth daily as needed for constipation.    doxycycline (VIBRA-TABS) 100 MG tablet Take 1 tablet (100 mg total) by mouth 2 (two) times daily. (Patient not taking: No sig reported)   furosemide (LASIX) 40 MG tablet Take 1 tablet (40 mg total) by mouth 3 (three) times a week.   metoprolol tartrate (LOPRESSOR) 25 MG tablet Take 1 tablet (25 mg total) by mouth 2 (two) times daily.   montelukast (SINGULAIR) 10 MG tablet Take 1 tablet (10 mg total) by mouth at bedtime as needed (allergies).   Multiple Vitamin  (MULTIVITAMIN) tablet Take 1 tablet by mouth daily.   omeprazole (PRILOSEC) 20 MG capsule Take 20 mg by mouth daily. As needed   potassium chloride (KLOR-CON) 10 MEQ tablet Take 1 tablet (10 mEq total) by mouth 3 (three) times a week. Take with lasix   rosuvastatin (CRESTOR) 20 MG tablet Take 1 tablet by mouth once daily   tamsulosin (FLOMAX) 0.4 MG CAPS capsule Take 1 capsule (0.4 mg total) by mouth daily.   terbinafine (LAMISIL) 250 MG tablet Take 1 tablet (250 mg total) by mouth daily.   Vitamins/Minerals TABS Take by mouth.    No facility-administered medications prior to visit.    Review of Systems  Constitutional:  Positive for fever.  Gastrointestinal:  Positive for abdominal pain. Negative for constipation, diarrhea, hematochezia, melena, nausea and vomiting.  Genitourinary:  Negative for dysuria, frequency and hematuria.       Objective    BP (!) 132/44   Pulse (!) 46   Temp 98.4 F (36.9 C)   Resp 16   Ht '5\' 6"'$  (1.676 m)   Wt 241 lb (109.3 kg)   BMI 38.90 kg/m  BP Readings from Last 3 Encounters:  10/23/20 (!) 132/44  08/21/20 120/60  08/19/20 117/62   Wt Readings from Last 3 Encounters:  10/23/20 241 lb (109.3 kg)  08/21/20 242 lb 6 oz (109.9 kg)  08/19/20 240 lb (108.9 kg)      Physical Exam Vitals reviewed.  Constitutional:  Appearance: Normal appearance.  HENT:     Head: Normocephalic and atraumatic.     Right Ear: External ear normal.     Left Ear: External ear normal.  Eyes:     General: No scleral icterus.    Conjunctiva/sclera: Conjunctivae normal.  Cardiovascular:     Rate and Rhythm: Normal rate and regular rhythm.     Pulses: Normal pulses.     Heart sounds: Normal heart sounds.     Comments: No carotid bruit Pulmonary:     Effort: Pulmonary effort is normal.     Breath sounds: Normal breath sounds.  Abdominal:     General: There is no distension.     Palpations: Abdomen is soft.     Tenderness: There is abdominal tenderness in  the left lower quadrant.  Musculoskeletal:     Right lower leg: No edema.     Left lower leg: No edema.  Skin:    General: Skin is warm and dry.  Neurological:     General: No focal deficit present.     Mental Status: He is alert and oriented to person, place, and time.  Psychiatric:        Mood and Affect: Mood normal.        Behavior: Behavior normal.        Thought Content: Thought content normal.        Judgment: Judgment normal.      No results found for any visits on 10/23/20.  Assessment & Plan     1. Abdominal pain, unspecified abdominal location Clinically patient presents with diverticulitis.  This is a second episode in 4 months obtain CT scan in addition to treatment. More than 50% 25 minute visit spent in counseling or coordination of care  - CBC with Differential/Platelet - Comprehensive metabolic panel - POCT urinalysis dipstick - CT Abdomen Pelvis W Contrast; Future  2. Diverticulitis Follow-up in a week.  Patient with history of diverticulosis. - doxycycline (VIBRA-TABS) 100 MG tablet; Take 1 tablet (100 mg total) by mouth 2 (two) times daily.  Dispense: 14 tablet; Refill: 0 - CT Abdomen Pelvis W Contrast; Future  3. Lower abdominal pain  - CT Abdomen Pelvis W Contrast; Future   4. Essential hypertension Controlled  5. Coronary artery disease of bypass graft of native heart with stable angina pectoris ([place]) All risk factors treated  6. Obstructive sleep apnea syndrome On CPAP  7. HLD   No follow-ups on file.      I, [name], MD, have reviewed all documentation for this visit. The documentation on 10/28/20 for the exam, diagnosis, procedures, and orders are all accurate and complete.    Addaline Peplinski Cranford Mon, MD  [facility] [phone] (phone) [phone] (fax)  [place]

## 2020-10-24 LAB — CBC WITH DIFFERENTIAL/PLATELET
Basophils Absolute: 0 10*3/uL (ref 0.0–0.2)
Basos: 1 %
EOS (ABSOLUTE): 0.1 10*3/uL (ref 0.0–0.4)
Eos: 2 %
Hematocrit: 40.6 % (ref 37.5–51.0)
Hemoglobin: 14.3 g/dL (ref 13.0–17.7)
Immature Grans (Abs): 0 10*3/uL (ref 0.0–0.1)
Immature Granulocytes: 0 %
Lymphocytes Absolute: 2.9 10*3/uL (ref 0.7–3.1)
Lymphs: 47 %
MCH: 33 pg (ref 26.6–33.0)
MCHC: 35.2 g/dL (ref 31.5–35.7)
MCV: 94 fL (ref 79–97)
Monocytes Absolute: 0.7 10*3/uL (ref 0.1–0.9)
Monocytes: 11 %
Neutrophils Absolute: 2.4 10*3/uL (ref 1.4–7.0)
Neutrophils: 39 %
Platelets: 133 10*3/uL — ABNORMAL LOW (ref 150–450)
RBC: 4.33 x10E6/uL (ref 4.14–5.80)
RDW: 12.8 % (ref 11.6–15.4)
WBC: 6.1 10*3/uL (ref 3.4–10.8)

## 2020-10-24 LAB — COMPREHENSIVE METABOLIC PANEL
ALT: 20 IU/L (ref 0–44)
AST: 24 IU/L (ref 0–40)
Albumin/Globulin Ratio: 2 (ref 1.2–2.2)
Albumin: 4.4 g/dL (ref 3.7–4.7)
Alkaline Phosphatase: 51 IU/L (ref 44–121)
BUN/Creatinine Ratio: 23 (ref 10–24)
BUN: 23 mg/dL (ref 8–27)
Bilirubin Total: 0.6 mg/dL (ref 0.0–1.2)
CO2: 24 mmol/L (ref 20–29)
Calcium: 9.4 mg/dL (ref 8.6–10.2)
Chloride: 104 mmol/L (ref 96–106)
Creatinine, Ser: 1.01 mg/dL (ref 0.76–1.27)
Globulin, Total: 2.2 g/dL (ref 1.5–4.5)
Glucose: 81 mg/dL (ref 65–99)
Potassium: 4.5 mmol/L (ref 3.5–5.2)
Sodium: 142 mmol/L (ref 134–144)
Total Protein: 6.6 g/dL (ref 6.0–8.5)
eGFR: 76 mL/min/{1.73_m2} (ref >59)

## 2020-10-31 ENCOUNTER — Other Ambulatory Visit: Payer: Medicare Other

## 2020-11-15 ENCOUNTER — Ambulatory Visit
Admission: RE | Admit: 2020-11-15 | Discharge: 2020-11-15 | Disposition: A | Discharge status: Home / Self Care | Payer: Medicare Other | Source: Ambulatory Visit | Attending: Family Medicine | Admitting: Family Medicine

## 2020-11-15 ENCOUNTER — Other Ambulatory Visit: Payer: Self-pay

## 2020-11-15 DIAGNOSIS — R109 Unspecified abdominal pain: Secondary | ICD-10-CM | POA: Insufficient documentation

## 2020-11-15 DIAGNOSIS — K5792 Diverticulitis of intestine, part unspecified, without perforation or abscess without bleeding: Secondary | ICD-10-CM | POA: Diagnosis not present

## 2020-11-15 DIAGNOSIS — R1030 Lower abdominal pain, unspecified: Secondary | ICD-10-CM | POA: Insufficient documentation

## 2020-11-15 MED ORDER — IOHEXOL 350 MG/ML SOLN
100.0000 mL | Freq: Once | INTRAVENOUS | Status: AC | PRN
  Administered 2020-11-15: 100 mL via INTRAVENOUS

## 2020-11-21 ENCOUNTER — Telehealth: Payer: Self-pay

## 2020-11-21 MED ORDER — CIPROFLOXACIN HCL 500 MG PO TABS: 500.0000 mg | ORAL_TABLET | Freq: Two times a day (BID) | ORAL | 0 refills | Status: AC

## 2020-11-21 MED ORDER — METRONIDAZOLE 500 MG PO TABS: 500.0000 mg | ORAL_TABLET | Freq: Two times a day (BID) | ORAL | 0 refills | Status: AC

## 2020-11-21 NOTE — Telephone Encounter (Signed)
Copied from [place] [id number]. Topic: General - Other >> Nov 21, 2020  3:00 PM Pawlus, Brayton Layman A wrote: Reason for CRM: Pt requested a call back to go over his latest imaging results- CT Abdomen Pelvis W Contrast, pt stated he does not fully understand the results he viewed on MyChart, please advise.

## 2020-11-21 NOTE — Telephone Encounter (Signed)
Pt advised.  RX sent to [place]   Thanks,   Mickel Baas

## 2020-11-21 NOTE — Telephone Encounter (Signed)
CT shows mild diverticulitis. If he is having any symptoms at all then need to start metronidazole 500mg  bid for 7 days and ciprofloxacin 500mg  twice a day for 7 days.

## 2020-11-21 NOTE — Telephone Encounter (Signed)
Please advise CT scan? Patient is still having abdominal discomfort. Patient is aware Dr. Rosanna Randy is out of the office until 11/25/2020. Patient requested Dr. Caryn Section review result.

## 2020-11-27 ENCOUNTER — Other Ambulatory Visit: Payer: Self-pay | Admitting: Family Medicine

## 2020-11-27 DIAGNOSIS — I10 Essential (primary) hypertension: Secondary | ICD-10-CM

## 2020-11-28 NOTE — Telephone Encounter (Signed)
Requested Prescriptions  Pending Prescriptions Disp Refills  . amLODipine (NORVASC) 10 MG tablet [Pharmacy Med Name: amLODIPine Besylate 10 MG Oral Tablet] 90 tablet 1    Sig: Take 1 tablet by mouth once daily     Cardiovascular:  Calcium Channel Blockers Failed - 11/27/2020  7:58 PM      Failed - Last BP in normal range    BP Readings from Last 1 Encounters:  10/23/20 (!) 132/44         Passed - Valid encounter within last 6 months    Recent Outpatient Visits          1 month ago Abdominal pain, unspecified abdominal location   [facility] Jerrol Banana., MD   3 months ago Nocturia   [facility] Jerrol Banana., MD   5 months ago [place] Jerrol Banana., MD   5 months ago Abdominal pain, unspecified abdominal location   [facility] Jerrol Banana., MD   5 months ago Annual physical exam   [facility] Jerrol Banana., MD      Future Appointments            In 5 days Jerrol Banana., MD [facility], [place]   In 3 months Ralene Bathe, MD [place]

## 2020-12-03 ENCOUNTER — Other Ambulatory Visit: Payer: Self-pay

## 2020-12-03 ENCOUNTER — Ambulatory Visit (INDEPENDENT_AMBULATORY_CARE_PROVIDER_SITE_OTHER): Payer: Medicare Other | Admitting: Family Medicine

## 2020-12-03 VITALS — BP 144/43 | HR 56 | Temp 98.0°F | Wt 239.0 lb

## 2020-12-03 DIAGNOSIS — K5792 Diverticulitis of intestine, part unspecified, without perforation or abscess without bleeding: Secondary | ICD-10-CM | POA: Diagnosis not present

## 2020-12-03 DIAGNOSIS — I25708 Atherosclerosis of coronary artery bypass graft(s), unspecified, with other forms of angina pectoris: Secondary | ICD-10-CM

## 2020-12-03 DIAGNOSIS — E785 Hyperlipidemia, unspecified: Secondary | ICD-10-CM

## 2020-12-03 DIAGNOSIS — I6523 Occlusion and stenosis of bilateral carotid arteries: Secondary | ICD-10-CM

## 2020-12-03 DIAGNOSIS — N189 Chronic kidney disease, unspecified: Secondary | ICD-10-CM | POA: Insufficient documentation

## 2020-12-03 DIAGNOSIS — G4733 Obstructive sleep apnea (adult) (pediatric): Secondary | ICD-10-CM

## 2020-12-03 DIAGNOSIS — Z23 Encounter for immunization: Secondary | ICD-10-CM | POA: Diagnosis not present

## 2020-12-03 DIAGNOSIS — I350 Nonrheumatic aortic (valve) stenosis: Secondary | ICD-10-CM

## 2020-12-03 DIAGNOSIS — I10 Essential (primary) hypertension: Secondary | ICD-10-CM

## 2020-12-03 DIAGNOSIS — R739 Hyperglycemia, unspecified: Secondary | ICD-10-CM

## 2020-12-03 DIAGNOSIS — N183 Chronic kidney disease, stage 3 unspecified: Secondary | ICD-10-CM

## 2020-12-03 DIAGNOSIS — N400 Enlarged prostate without lower urinary tract symptoms: Secondary | ICD-10-CM

## 2020-12-03 DIAGNOSIS — N200 Calculus of kidney: Secondary | ICD-10-CM

## 2020-12-03 NOTE — Progress Notes (Signed)
Established patient visit   Patient: Jacob Montes   DOB: 07-12-1940   80 y.o. Male  MRN: [id number] Visit Date: 12/03/2020  Today's healthcare provider: [name], MD   No chief complaint on file.  Subjective    HPI  Patient is an 80 year old male who presents for follow up after having diverticulitis.  He was seen on 10/23/20 for abdominal pain.  He had CT scan on 11/15/20 and it confirmed the diverticulitis.  In the absence of his PCP when the result came in, Dr Caryn Section sent in antibiotics.  However, the patient states they were never actually given the results of the CT.   Patient was put on antibiotics on 11/21/20 until 11/28/20.  He had fever until 11/21/20, diarrhea until 11/28/20.  He finished the antibiotics on 11/28/20.  He still complains of some fatigue but no pain.  He is overall feeling some better.      Medications: Outpatient Medications Prior to Visit  Medication Sig   amLODipine (NORVASC) 10 MG tablet Take 1 tablet by mouth once daily   aspirin 81 MG chewable tablet Chew 81 mg by mouth daily.   docusate sodium (COLACE) 250 MG capsule Take 250 mg by mouth daily as needed for constipation.    doxycycline (VIBRA-TABS) 100 MG tablet Take 1 tablet (100 mg total) by mouth 2 (two) times daily.   furosemide (LASIX) 40 MG tablet Take 1 tablet (40 mg total) by mouth 3 (three) times a week.   metoprolol tartrate (LOPRESSOR) 25 MG tablet Take 1 tablet (25 mg total) by mouth 2 (two) times daily.   montelukast (SINGULAIR) 10 MG tablet Take 1 tablet (10 mg total) by mouth at bedtime as needed (allergies).   Multiple Vitamin (MULTIVITAMIN) tablet Take 1 tablet by mouth daily.   omeprazole (PRILOSEC) 20 MG capsule Take 20 mg by mouth daily. As needed   potassium chloride (KLOR-CON) 10 MEQ tablet Take 1 tablet (10 mEq total) by mouth 3 (three) times a week. Take with lasix   rosuvastatin (CRESTOR) 20 MG tablet Take 1 tablet by mouth once daily   tamsulosin (FLOMAX) 0.4 MG  CAPS capsule Take 1 capsule (0.4 mg total) by mouth daily.   terbinafine (LAMISIL) 250 MG tablet Take 1 tablet (250 mg total) by mouth daily.   Vitamins/Minerals TABS Take by mouth.    No facility-administered medications prior to visit.    Review of Systems  Gastrointestinal:  Positive for abdominal pain (None since Saturday). Negative for blood in stool, constipation, diarrhea, nausea and vomiting.   Last CBC Lab Results  Component Value Date   WBC 5.5 12/03/2020   HGB 13.7 12/03/2020   HCT 40.5 12/03/2020   MCV 96 12/03/2020   MCH 32.6 12/03/2020   RDW 13.1 12/03/2020   PLT 135 (L) 62/22/9798   Last metabolic panel Lab Results  Component Value Date   GLUCOSE 93 12/03/2020   NA 143 12/03/2020   K 4.9 12/03/2020   CL 106 12/03/2020   CO2 26 12/03/2020   BUN 12 12/03/2020   CREATININE 0.79 12/03/2020   EGFR 90 12/03/2020   CALCIUM 9.1 12/03/2020   PROT 6.2 12/03/2020   ALBUMIN 4.1 12/03/2020   LABGLOB 2.1 12/03/2020   AGRATIO 2.0 12/03/2020   BILITOT 0.6 12/03/2020   ALKPHOS 51 12/03/2020   AST 42 (H) 12/03/2020   ALT 41 12/03/2020   ANIONGAP 7 11/21/2018       Objective    BP Marland Kitchen)  144/43 (BP Location: Right Arm, Patient Position: Sitting, Cuff Size: Normal)   Pulse (!) 56   Temp 98 F (36.7 C) (Oral)   Wt 239 lb (108.4 kg)   SpO2 98%   BMI 38.58 kg/m  BP Readings from Last 3 Encounters:  12/03/20 (!) 144/43  10/23/20 (!) 132/44  08/21/20 120/60   Wt Readings from Last 3 Encounters:  12/03/20 239 lb (108.4 kg)  10/23/20 241 lb (109.3 kg)  08/21/20 242 lb 6 oz (109.9 kg)      Physical Exam Vitals reviewed.  Constitutional:      Appearance: Normal appearance.  HENT:     Head: Normocephalic and atraumatic.     Right Ear: External ear normal.     Left Ear: External ear normal.  Eyes:     General: No scleral icterus.    Conjunctiva/sclera: Conjunctivae normal.  Cardiovascular:     Rate and Rhythm: Normal rate and regular rhythm.     Pulses:  Normal pulses.     Heart sounds: Normal heart sounds.     Comments: No carotid bruit Pulmonary:     Effort: Pulmonary effort is normal.     Breath sounds: Normal breath sounds.  Abdominal:     General: There is no distension.     Palpations: Abdomen is soft.  Musculoskeletal:     Right lower leg: No edema.     Left lower leg: No edema.  Skin:    General: Skin is warm and dry.  Neurological:     General: No focal deficit present.     Mental Status: He is alert and oriented to person, place, and time.  Psychiatric:        Mood and Affect: Mood normal.        Behavior: Behavior normal.        Thought Content: Thought content normal.        Judgment: Judgment normal.      No results found for any visits on 12/03/20.  Assessment & Plan     1. Diverticulitis Patient has had problems with recurrent abdominal pain consistent with diverticulitis.  He just finished antibiotics.  He has better but will refer to GI for input going forward. - CBC with Differential/Platelet - Ambulatory referral to Gastroenterology  2. Essential hypertension Home blood pressures for the most part 20s to the 150 range daily. - CBC with Differential/Platelet - Comprehensive metabolic panel  3. Need for influenza vaccination   4. Hyperglycemia Prediabetic.  Last A1c 5.8.  Continue to work on diet and exercise. - CBC with Differential/Platelet - Comprehensive metabolic panel - Hemoglobin A1c  5. 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  Push fluids.  Consider adding ARB for blood pressure  6. Coronary artery disease of bypass graft of native heart with stable angina pectoris ([place]) All risk factors treated.  Patient on Crestor and followed by cardiology.  7. OSA (obstructive sleep apnea)   8. Obstructive sleep apnea syndrome   9. Carotid stenosis, bilateral   10. Aortic valve stenosis, etiology of cardiac valve  disease unspecified   11. Nephrolithiasis History of kidney stone  12. 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   13. Dyslipidemia Metabolic syndrome, diet and exercise with any weight loss would be helpful   No follow-ups on file.      I, [name], MD, have reviewed all documentation for this visit. The documentation on 12/07/20 for the  exam, diagnosis, procedures, and orders are all accurate and complete.    Karman Veney Cranford Mon, MD  [facility] [phone] (phone) [phone] (fax)  [place]

## 2020-12-04 LAB — COMPREHENSIVE METABOLIC PANEL
ALT: 41 IU/L (ref 0–44)
AST: 42 IU/L — ABNORMAL HIGH (ref 0–40)
Albumin/Globulin Ratio: 2 (ref 1.2–2.2)
Albumin: 4.1 g/dL (ref 3.7–4.7)
Alkaline Phosphatase: 51 IU/L (ref 44–121)
BUN/Creatinine Ratio: 15 (ref 10–24)
BUN: 12 mg/dL (ref 8–27)
Bilirubin Total: 0.6 mg/dL (ref 0.0–1.2)
CO2: 26 mmol/L (ref 20–29)
Calcium: 9.1 mg/dL (ref 8.6–10.2)
Chloride: 106 mmol/L (ref 96–106)
Creatinine, Ser: 0.79 mg/dL (ref 0.76–1.27)
Globulin, Total: 2.1 g/dL (ref 1.5–4.5)
Glucose: 93 mg/dL (ref 70–99)
Potassium: 4.9 mmol/L (ref 3.5–5.2)
Sodium: 143 mmol/L (ref 134–144)
Total Protein: 6.2 g/dL (ref 6.0–8.5)
eGFR: 90 mL/min/{1.73_m2} (ref >59)

## 2020-12-04 LAB — CBC WITH DIFFERENTIAL/PLATELET
Basophils Absolute: 0 10*3/uL (ref 0.0–0.2)
Basos: 0 %
EOS (ABSOLUTE): 0.1 10*3/uL (ref 0.0–0.4)
Eos: 3 %
Hematocrit: 40.5 % (ref 37.5–51.0)
Hemoglobin: 13.7 g/dL (ref 13.0–17.7)
Immature Grans (Abs): 0 10*3/uL (ref 0.0–0.1)
Immature Granulocytes: 0 %
Lymphocytes Absolute: 2.3 10*3/uL (ref 0.7–3.1)
Lymphs: 42 %
MCH: 32.6 pg (ref 26.6–33.0)
MCHC: 33.8 g/dL (ref 31.5–35.7)
MCV: 96 fL (ref 79–97)
Monocytes Absolute: 0.6 10*3/uL (ref 0.1–0.9)
Monocytes: 11 %
Neutrophils Absolute: 2.4 10*3/uL (ref 1.4–7.0)
Neutrophils: 44 %
Platelets: 135 10*3/uL — ABNORMAL LOW (ref 150–450)
RBC: 4.2 x10E6/uL (ref 4.14–5.80)
RDW: 13.1 % (ref 11.6–15.4)
WBC: 5.5 10*3/uL (ref 3.4–10.8)

## 2020-12-04 LAB — HEMOGLOBIN A1C
Est. average glucose Bld gHb Est-mCnc: 114 mg/dL
Hgb A1c MFr Bld: 5.6 % (ref 4.8–5.6)

## 2020-12-09 ENCOUNTER — Encounter: Payer: Self-pay | Admitting: Family Medicine

## 2020-12-10 ENCOUNTER — Ambulatory Visit: Payer: Self-pay

## 2020-12-10 DIAGNOSIS — K5792 Diverticulitis of intestine, part unspecified, without perforation or abscess without bleeding: Secondary | ICD-10-CM

## 2020-12-10 MED ORDER — DOXYCYCLINE HYCLATE 100 MG PO TABS: 100.0000 mg | ORAL_TABLET | Freq: Two times a day (BID) | ORAL | 0 refills | Status: DC

## 2020-12-10 NOTE — Telephone Encounter (Signed)
Pt. Has history of diverticulitis. Finished antibiotics last month. Started having lower abdominal pain all across abdomen today. Pain is constant. 7/10. Had mild diarrhea with this.Seeing GI doctor in December. Asking for Dr. Rosanna Randy to send antibiotic to pharmacy. Instructed to go to ED for worsening of symptoms. Please advise pt.    Answer Assessment - Initial Assessment Questions 1. LOCATION: "Where does it hurt?"      Lower 2. RADIATION: "Does the pain shoot anywhere else?" (e.g., chest, back)     No 3. ONSET: "When did the pain begin?" (Minutes, hours or days ago)      Today 4. SUDDEN: "Gradual or sudden onset?"     Sudden 5. PATTERN "Does the pain come and go, or is it constant?"    - If constant: "Is it getting better, staying the same, or worsening?"      (Note: Constant means the pain never goes away completely; most serious pain is constant and it progresses)     - If intermittent: "How long does it last?" "Do you have pain now?"     (Note: Intermittent means the pain goes away completely between bouts)     Constant 6. SEVERITY: "How bad is the pain?"  (e.g., Scale 1-10; mild, moderate, or severe)    - MILD (1-3): doesn't interfere with normal activities, abdomen soft and not tender to touch     - MODERATE (4-7): interferes with normal activities or awakens from sleep, abdomen tender to touch     - SEVERE (8-10): excruciating pain, doubled over, unable to do any normal activities       Now - 7 7. RECURRENT SYMPTOM: "Have you ever had this type of stomach pain before?" If Yes, ask: "When was the last time?" and "What happened that time?"      Yes 8. CAUSE: "What do you think is causing the stomach pain?"     Diverticulitis 9. RELIEVING/AGGRAVATING FACTORS: "What makes it better or worse?" (e.g., movement, antacids, bowel movement)     No 10. OTHER SYMPTOMS: "Do you have any other symptoms?" (e.g., back pain, diarrhea, fever, urination pain, vomiting)       Some  diarrhea  Protocols used: Abdominal Pain - Male-A-AH

## 2020-12-10 NOTE — Telephone Encounter (Signed)
Patient advised and Rx sent to pharmacy 

## 2020-12-11 DIAGNOSIS — B351 Tinea unguium: Secondary | ICD-10-CM | POA: Diagnosis not present

## 2020-12-11 DIAGNOSIS — M79674 Pain in right toe(s): Secondary | ICD-10-CM | POA: Diagnosis not present

## 2020-12-11 DIAGNOSIS — M79675 Pain in left toe(s): Secondary | ICD-10-CM | POA: Diagnosis not present

## 2020-12-17 ENCOUNTER — Other Ambulatory Visit: Payer: Self-pay | Admitting: Family Medicine

## 2020-12-17 DIAGNOSIS — R351 Nocturia: Secondary | ICD-10-CM

## 2020-12-17 DIAGNOSIS — N401 Enlarged prostate with lower urinary tract symptoms: Secondary | ICD-10-CM

## 2020-12-18 NOTE — Telephone Encounter (Signed)
Requested Prescriptions  Pending Prescriptions Disp Refills  . tamsulosin (FLOMAX) 0.4 MG CAPS capsule [Pharmacy Med Name: Tamsulosin HCl 0.4 MG Oral Capsule] 90 capsule 0    Sig: Take 1 capsule by mouth once daily     Urology: Alpha-Adrenergic Blocker Failed - 12/17/2020  5:27 PM      Failed - Last BP in normal range    BP Readings from Last 1 Encounters:  12/03/20 (!) 144/43         Passed - Valid encounter within last 12 months    Recent Outpatient Visits          2 weeks ago Diverticulitis   [facility] Jerrol Banana., MD   1 month ago Abdominal pain, unspecified abdominal location   [facility] Jerrol Banana., MD   4 months ago Nocturia   [facility] Jerrol Banana., MD   5 months ago [place] Jerrol Banana., MD   5 months ago Abdominal pain, unspecified abdominal location   [facility] Jerrol Banana., MD      Future Appointments            In 2 months Ralene Bathe, MD [place]   In 3 months Jerrol Banana., MD [facility], [place]

## 2021-01-15 ENCOUNTER — Ambulatory Visit (INDEPENDENT_AMBULATORY_CARE_PROVIDER_SITE_OTHER): Payer: Medicare Other | Admitting: Gastroenterology

## 2021-01-15 ENCOUNTER — Other Ambulatory Visit: Payer: Self-pay

## 2021-01-15 ENCOUNTER — Encounter: Payer: Self-pay | Admitting: Gastroenterology

## 2021-01-15 VITALS — BP 163/61 | HR 58 | Temp 97.8°F | Ht 66.0 in | Wt 239.2 lb

## 2021-01-15 DIAGNOSIS — K5732 Diverticulitis of large intestine without perforation or abscess without bleeding: Secondary | ICD-10-CM

## 2021-01-15 NOTE — Progress Notes (Signed)
Primary Care Physician: Jerrol Banana., MD  Primary Gastroenterologist:  Dr. [name]  Chief Complaint  Patient presents with   New Patient (Initial Visit)    diverticulitis    HPI: Jacob Montes is a 80 y.o. male here after seeing me in 2020 for an ERCP for a common bile duct stone extraction.  The patient has had recurrent diverticulitis with 1-2 episodes per year.  The patient's most recent episode was treated with Cipro and Flagyl and he states he continued to have symptoms and was started on doxycycline with resolution of his symptoms.  The patient has not had a colonoscopy in many years.  The patient denies any unexplained weight loss fevers chills nausea or vomiting.  He is here to follow-up since he is wondering why he is having these attacks more frequently.  There is no report of any abdominal pain nausea vomiting black stools or bloody stools at the present time.  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 s/p 3-v CABG in 07/2007   Carotid artery disease ([place])    a. 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     docusate sodium (COLACE) 250 MG capsule Take 250 mg by mouth daily as needed for constipation.      furosemide (LASIX) 40 MG tablet Take 1 tablet (40 mg total) by mouth 3 (three) times a week. 39 tablet 3   metoprolol tartrate (LOPRESSOR) 25 MG tablet Take  1 tablet (25 mg total) by mouth 2 (two) times daily. 180 tablet 3   montelukast (SINGULAIR) 10 MG tablet Take 1 tablet (10 mg total) by mouth at bedtime as needed (allergies). 30 tablet 3   Multiple Vitamin (MULTIVITAMIN) tablet Take 1 tablet by mouth daily.     omeprazole (PRILOSEC) 20 MG capsule Take 20 mg by mouth daily. As needed     rosuvastatin (CRESTOR) 20 MG tablet Take 1 tablet by mouth once daily 90 tablet 3   tamsulosin (FLOMAX) 0.4 MG CAPS capsule Take 1 capsule by mouth once daily 90 capsule 0   Vitamins/Minerals TABS Take by mouth.      potassium chloride (KLOR-CON) 10 MEQ tablet Take 1 tablet (10 mEq total) by mouth 3 (three) times a week. Take with lasix 90 tablet 3   No current facility-administered medications for this visit.    Allergies as of 01/15/2021 - Review Complete 01/15/2021  Allergen Reaction Noted   Atorvastatin Other (See Comments) 06/11/2005   Ramipril Other (See Comments) 01/12/2006    ROS:  General: Negative for anorexia, weight loss, fever, chills, fatigue, weakness. ENT: Negative for hoarseness, difficulty swallowing , nasal congestion. CV: Negative for chest pain, angina, palpitations, dyspnea on exertion, peripheral edema.  Respiratory: Negative for dyspnea at rest, dyspnea on exertion,  cough, sputum, wheezing.  GI: See history of present illness. GU:  Negative for dysuria, hematuria, urinary incontinence, urinary frequency, nocturnal urination.  Endo: Negative for unusual weight change.    Physical Examination:   BP (!) 163/61 (BP Location: Left Arm, Patient Position: Sitting, Cuff Size: Large)   Pulse (!) 58   Temp 97.8 F (36.6 C) (Temporal)   Ht 5\' 6"  (1.676 m)   Wt 239 lb 3.2 oz (108.5 kg)   BMI 38.61 kg/m   General: Well-nourished, well-developed in no acute distress.  Eyes: No icterus. Conjunctivae pink. Lungs: Clear to auscultation bilaterally. Non-labored. Heart: Regular rate and rhythm, no murmurs rubs or gallops.  Abdomen:  Bowel sounds are normal, nontender, nondistended, no hepatosplenomegaly or masses, no abdominal bruits or hernia , no rebound or guarding.   Extremities: No lower extremity edema. No clubbing or deformities. Neuro: Alert and oriented x 3.  Grossly intact. Skin: Warm and dry, no jaundice.   Psych: Alert and cooperative, normal mood and affect.  Labs:    Imaging Studies: No results found.  Assessment and Plan:   Jacob Montes is a 80 y.o. y/o male who comes in today with episodes of recurrent diverticulitis.  The patient has been told about the rare presentation of colon cancer as diverticulitis and he should consider colonoscopy due to his recent bout of diverticulitis.  The patient has also been informed of the possibility of requiring surgery in the future if he should have a perforation or his frequency of diverticulitis increases.  The patient has been told to stay away from anti-inflammatory medications which can increase the risk of diverticulitis and somebody was diverticulosis.  The patient states he would like to think about whether he would want to pursue a colonoscopy at this time.  If he does decide on a colonoscopy he will contact my office and set up.  The patient has been explained the plan and agrees with it.     [name], MD. [facility]    Note: This dictation was prepared with Dragon dictation along with smaller phrase technology. Any transcriptional errors that result from this process are unintentional.

## 2021-01-16 ENCOUNTER — Ambulatory Visit (INDEPENDENT_AMBULATORY_CARE_PROVIDER_SITE_OTHER): Payer: Medicare Other

## 2021-01-16 DIAGNOSIS — I6521 Occlusion and stenosis of right carotid artery: Secondary | ICD-10-CM | POA: Diagnosis not present

## 2021-01-16 DIAGNOSIS — I25118 Atherosclerotic heart disease of native coronary artery with other forms of angina pectoris: Secondary | ICD-10-CM | POA: Diagnosis not present

## 2021-01-16 DIAGNOSIS — I350 Nonrheumatic aortic (valve) stenosis: Secondary | ICD-10-CM

## 2021-01-16 DIAGNOSIS — I2510 Atherosclerotic heart disease of native coronary artery without angina pectoris: Secondary | ICD-10-CM | POA: Diagnosis not present

## 2021-01-16 DIAGNOSIS — Z952 Presence of prosthetic heart valve: Secondary | ICD-10-CM | POA: Diagnosis not present

## 2021-01-16 MED ORDER — PERFLUTREN LIPID MICROSPHERE
1.0000 mL | INTRAVENOUS | Status: AC | PRN
  Administered 2021-01-16: 2 mL via INTRAVENOUS

## 2021-01-17 LAB — ECHOCARDIOGRAM COMPLETE
AR max vel: 1.24 cm2
AV Area VTI: 1.27 cm2
AV Area mean vel: 1.32 cm2
AV Mean grad: 23.3 mmHg
AV Peak grad: 40.2 mmHg
Ao pk vel: 3.17 m/s
Area-P 1/2: 3.46 cm2
S' Lateral: 4.4 cm

## 2021-01-21 ENCOUNTER — Telehealth: Payer: Self-pay

## 2021-01-21 NOTE — Telephone Encounter (Signed)
Able to reach pt regarding his recent Carotid US Dr. Rockey Situ had a chance to review his results and advised   "Carotid ultrasound  No hemodynamically significant stenosis bilaterally "  Mr. Baba very thankful for the phone call of his results, all questions and concerns were address with nothing further at this time. Will see at next schedule f/u appt.

## 2021-01-21 NOTE — Telephone Encounter (Signed)
Able to reach pt regarding his recent ECHO Dr. 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 very thankful for the phone call of his results, all questions and concerns were address with nothing further at this time. Will see at next schedule f/u appt.

## 2021-03-06 ENCOUNTER — Other Ambulatory Visit: Payer: Self-pay

## 2021-03-06 ENCOUNTER — Ambulatory Visit: Payer: Medicare Other | Admitting: Dermatology

## 2021-03-06 DIAGNOSIS — L821 Other seborrheic keratosis: Secondary | ICD-10-CM

## 2021-03-06 DIAGNOSIS — L570 Actinic keratosis: Secondary | ICD-10-CM

## 2021-03-06 DIAGNOSIS — L578 Other skin changes due to chronic exposure to nonionizing radiation: Secondary | ICD-10-CM

## 2021-03-06 DIAGNOSIS — L820 Inflamed seborrheic keratosis: Secondary | ICD-10-CM | POA: Diagnosis not present

## 2021-03-06 NOTE — Patient Instructions (Addendum)
Actinic keratoses are precancerous spots that appear secondary to cumulative UV radiation exposure/sun exposure over time. They are chronic with expected duration over 1 year. A portion of actinic keratoses will progress to squamous cell carcinoma of the skin. It is not possible to reliably predict which spots will progress to skin cancer and so treatment is recommended to prevent development of skin cancer.  Recommend daily broad spectrum sunscreen SPF 30+ to sun-exposed areas, reapply every 2 hours as needed.  Recommend staying in the shade or wearing long sleeves, sun glasses (UVA+UVB protection) and wide brim hats (4-inch brim around the entire circumference of the hat). Call for new or changing lesions.   Cryotherapy Aftercare  Wash gently with soap and water everyday.   Apply Vaseline and Band-Aid daily until healed.    Seborrheic Keratosis  What causes seborrheic keratoses? Seborrheic keratoses are harmless, common skin growths that first appear during adult life.  As time goes by, more growths appear.  Some people may develop a large number of them.  Seborrheic keratoses appear on both covered and uncovered body parts.  They are not caused by sunlight.  The tendency to develop seborrheic keratoses can be inherited.  They vary in color from skin-colored to gray, brown, or even black.  They can be either smooth or have a rough, warty surface.   Seborrheic keratoses are superficial and look as if they were stuck on the skin.  Under the microscope this type of keratosis looks like layers upon layers of skin.  That is why at times the top layer may seem to fall off, but the rest of the growth remains and re-grows.    Treatment Seborrheic keratoses do not need to be treated, but can easily be removed in the office.  Seborrheic keratoses often cause symptoms when they rub on clothing or jewelry.  Lesions can be in the way of shaving.  If they become inflamed, they can cause itching,  soreness, or burning.  Removal of a seborrheic keratosis can be accomplished by freezing, burning, or surgery. If any spot bleeds, scabs, or grows rapidly, please return to have it checked, as these can be an indication of a skin cancer.   If You Need Anything After Your Visit  If you have any questions or concerns for your doctor, please call our main line at [phone] and press option 4 to reach your doctor's medical assistant. If no one answers, please leave a voicemail as directed and we will return your call as soon as possible. Messages left after 4 pm will be answered the following business day.   You may also send [place] a message via [place]. We typically respond to MyChart messages within 1-2 business days.  For prescription refills, please ask your pharmacy to contact our office. Our fax number is [phone].  If you have an urgent issue when the clinic is closed that cannot wait until the next business day, you can page your doctor at the number below.    Please note that while we do our best to be available for urgent issues outside of office hours, we are not available 24/7.   If you have an urgent issue and are unable to reach [place], you may choose to seek medical care at your doctor's office, retail clinic, urgent care center, or emergency room.  If you have a medical emergency, please immediately call 911 or go to the emergency department.  Pager Numbers  - Dr. Nehemiah Massed: [phone]  -  Dr. Laurence Ferrari: [phone]  - Dr. Nicole Kindred: [phone]  In the event of inclement weather, please call our main line at [phone] for an update on the status of any delays or closures.  Dermatology Medication Tips: Please keep the boxes that topical medications come in in order to help keep track of the instructions about where and how to use these. Pharmacies typically print the medication instructions only on the boxes and not directly on the medication tubes.   If your medication is  too expensive, please contact our office at [phone] option 4 or send [place] a message through [place].   We are unable to tell what your co-pay for medications will be in advance as this is different depending on your insurance coverage. However, we may be able to find a substitute medication at lower cost or fill out paperwork to get insurance to cover a needed medication.   If a prior authorization is required to get your medication covered by your insurance company, please allow [place] 1-2 business days to complete this process.  Drug prices often vary depending on where the prescription is filled and some pharmacies may offer cheaper prices.  The website www.goodrx.com contains coupons for medications through different pharmacies. The prices here do not account for what the cost may be with help from insurance (it may be cheaper with your insurance), but the website can give you the price if you did not use any insurance.  - You can print the associated coupon and take it with your prescription to the pharmacy.  - You may also stop by our office during regular business hours and pick up a GoodRx coupon card.  - If you need your prescription sent electronically to a different pharmacy, notify our office through [facility] or by phone at [phone] option 4.     Si Usted Necesita Algo Despus de Su Visita  Tambin puede enviarnos un mensaje a travs de Pharmacist, community. Por lo general respondemos a los mensajes de MyChart en el transcurso de 1 a 2 das hbiles.  Para renovar recetas, por favor pida a su farmacia que se ponga en contacto con nuestra oficina. Harland Dingwall de fax es [place] [phone].  Si tiene un asunto urgente cuando la clnica est cerrada y que no puede esperar hasta el siguiente da hbil, puede llamar/localizar a su doctor(a) al nmero que aparece a continuacin.   Por favor, tenga en cuenta que aunque hacemos todo lo posible para estar disponibles para asuntos urgentes  fuera del horario de [place], no estamos disponibles las 24 horas del da, los 7 das de la [place].   Si tiene un problema urgente y no puede comunicarse con nosotros, puede optar por buscar atencin mdica  en el consultorio de su doctor(a), en una clnica privada, en un centro de atencin urgente o en una sala de emergencias.  Si tiene Engineering geologist, por favor llame inmediatamente al 911 o vaya a la sala de emergencias.  Nmeros de bper  - Dr. Nehemiah Massed: [phone]  - Dra. Moye: [phone]  - Dra. Nicole Kindred: [phone]  En caso de inclemencias del [place], por favor llame a Johnsie Kindred principal al [phone] para una actualizacin sobre el [place] de cualquier retraso o cierre.  Consejos para la medicacin en dermatologa: Por favor, guarde las cajas en las que vienen los medicamentos de uso tpico para ayudarle a seguir las instrucciones sobre dnde y cmo usarlos. Las farmacias generalmente imprimen las instrucciones del medicamento slo en las cajas y  no directamente en los tubos del medicamento.   Si su medicamento es muy caro, por favor, pngase en contacto con Zigmund Daniel llamando al [phone] y presione la opcin 4 o envenos un mensaje a travs de Pharmacist, community.   No podemos decirle cul ser su copago por los medicamentos por adelantado ya que esto es diferente dependiendo de la cobertura de su seguro. Sin embargo, es posible que podamos encontrar un medicamento sustituto a Electrical engineer un formulario para que el seguro cubra el medicamento que se considera necesario.   Si se requiere una autorizacin previa para que su compaa de seguros [place] su medicamento, por favor permtanos de 1 a 2 das hbiles para completar este proceso.  Los precios de los medicamentos varan con frecuencia dependiendo del Environmental consultant de dnde se surte la receta y alguna farmacias pueden ofrecer precios ms baratos.  El sitio web www.goodrx.com tiene cupones para medicamentos de  Airline pilot. Los precios aqu no tienen en cuenta lo que podra costar con la ayuda del seguro (puede ser ms barato con su seguro), pero el sitio web puede darle el precio si no utiliz Research scientist (physical sciences).  - Puede imprimir el cupn correspondiente y llevarlo con su receta a la farmacia.  - Tambin puede pasar por nuestra oficina durante el horario de atencin regular y Charity fundraiser una tarjeta de cupones de GoodRx.  - Si necesita que su receta se enve electrnicamente a una farmacia diferente, informe a nuestra oficina a travs de MyChart de [place] o por telfono llamando al [phone] y presione la opcin 4.

## 2021-03-06 NOTE — Progress Notes (Signed)
Follow-Up Visit   Subjective  Jacob Montes is a 81 y.o. male who presents for the following: Follow-up (Patient here today for 6 month tbse. Patient reports a spot at left cheek, left leg, at ankle and left side of scalp. Patient has history of aks). The patient has spots, moles and lesions to be evaluated, some may be new or changing and the patient has concerns that these could be cancer.  The following portions of the chart were reviewed this encounter and updated as appropriate:  Tobacco   Allergies   Meds   Problems   Med Hx   Surg Hx   Fam Hx      Review of Systems: No other skin or systemic complaints except as noted in HPI or Assessment and Plan.  Objective  Well appearing patient in no apparent distress; mood and affect are within normal limits.  A focused examination was performed including scalp face and ears and legs and arms and hands. Relevant physical exam findings are noted in the Assessment and Plan.  scalp, face, ears x 10 (10) Erythematous thin papules/macules with gritty scale.   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 They are chronic with expected duration over 1 year. A portion of actinic keratoses will progress to squamous cell carcinoma of the skin. It is not possible to reliably predict which spots will progress to skin cancer and so treatment is recommended to prevent development of skin cancer.  Recommend daily broad spectrum sunscreen SPF 30+ to sun-exposed areas, reapply every 2 hours as needed.  Recommend staying in the shade or wearing long sleeves, sun glasses (UVA+UVB protection) and wide brim hats (4-inch brim around the entire circumference of the hat). Call for new or changing lesions.  Destruction of lesion - scalp, face, ears x  10 Complexity: simple   Destruction method: cryotherapy   Informed consent: discussed and consent obtained   Timeout:  patient name, date of birth, surgical site, and procedure verified Lesion destroyed using liquid nitrogen: Yes   Region frozen until ice ball extended beyond lesion: Yes   Outcome: patient tolerated procedure well with no complications   Post-procedure details: wound care instructions given   Additional details:  Prior to procedure, discussed risks of blister formation, small wound, skin dyspigmentation, or rare scar following cryotherapy. Recommend Vaseline ointment to treated areas while healing.  Inflamed seborrheic keratosis (2) left ankle x 2 Irritated and itchy to patient  Destruction of lesion - left ankle x 2 Complexity: simple   Destruction method: cryotherapy   Informed consent: discussed and consent obtained   Timeout:  patient name, date of birth, surgical site, and procedure verified Lesion destroyed using liquid nitrogen: Yes   Region frozen until ice ball extended beyond lesion: Yes   Outcome: patient tolerated procedure well with no complications   Post-procedure details: wound care instructions given   Additional details:  Prior to procedure, discussed risks of blister formation, small wound, skin dyspigmentation, or rare scar following cryotherapy. Recommend Vaseline ointment to treated areas while healing.  Seborrheic Keratoses - Stuck-on, waxy, tan-brown papules and/or plaques  - Benign-appearing - Discussed benign etiology and prognosis. - Observe - Call for any changes  Actinic Damage - chronic, secondary to cumulative UV radiation exposure/sun exposure over time - diffuse scaly erythematous  macules with underlying dyspigmentation - Recommend daily broad spectrum sunscreen SPF 30+ to sun-exposed areas, reapply every 2 hours as needed.  - Recommend staying in the shade or wearing long sleeves, sun glasses (UVA+UVB protection) and wide brim  hats (4-inch brim around the entire circumference of the hat). - Call for new or changing lesions.  Return for 6 month tbse history of aks. IRuthell Rummage, CMA, am acting as scribe for Sarina Ser, MD. Documentation: I have reviewed the above documentation for accuracy and completeness, and I agree with the above.  Sarina Ser, MD

## 2021-03-07 ENCOUNTER — Encounter: Payer: Self-pay | Admitting: Dermatology

## 2021-03-27 ENCOUNTER — Telehealth: Payer: Self-pay | Admitting: Family Medicine

## 2021-03-27 NOTE — Telephone Encounter (Signed)
Copied from [place] [id number]. Topic: Medicare AWV >> Mar 27, 2021  2:32 PM Cher Nakai R wrote: Reason for CRM:  Left message for patient to call back and schedule Medicare Annual Wellness Visit (AWV) in office.   If not able to come in office, please offer to do virtually or by telephone.   Last AWV: 08/01/2019  Please schedule at anytime with [facility] Health Advisor.  If any questions, please contact me at [phone]

## 2021-03-31 DIAGNOSIS — B351 Tinea unguium: Secondary | ICD-10-CM | POA: Diagnosis not present

## 2021-03-31 DIAGNOSIS — M79675 Pain in left toe(s): Secondary | ICD-10-CM | POA: Diagnosis not present

## 2021-03-31 DIAGNOSIS — M79674 Pain in right toe(s): Secondary | ICD-10-CM | POA: Diagnosis not present

## 2021-04-07 NOTE — Progress Notes (Signed)
Argentina Ponder [place],acting as a scribe for [name], MD.,have documented all relevant documentation on the behalf of [name], MD,as directed by  [name], MD while in the presence of [name], MD.     Established patient visit   Patient: Jacob Montes   DOB: 1940/10/24   81 y.o. Male  MRN: [id number] Visit Date: 04/08/2021  Today's healthcare provider: [name], MD   No chief complaint on file.  Subjective    HPI  Overall patient is feeling very well.  No more GI problems.  No more abdominal pain.  The only complaint he has today is some restless leg  symptoms at night.  Hypertension, follow-up  BP Readings from Last 3 Encounters:  04/08/21 134/68  01/15/21 (!) 163/61  12/03/20 (!) 144/43   Wt Readings from Last 3 Encounters:  04/08/21 237 lb (107.5 kg)  01/15/21 239 lb 3.2 oz (108.5 kg)  12/03/20 239 lb (108.4 kg)     He was last seen for hypertension 4 months ago.  BP at that visit was as above. Management since that visit includes none.  He reports good compliance with treatment. He is not having side effects.  He is following a Regular diet. He is not exercising. He does not smoke.  Use of agents associated with hypertension: none.   Outside blood pressures are 120-150's over 50-60's. Symptoms: No chest pain No chest pressure  No palpitations No syncope  No dyspnea No orthopnea  No paroxysmal nocturnal dyspnea Yes lower extremity edema   Pertinent labs: Lab Results  Component Value Date   CHOL 135 06/03/2020   HDL 47 06/03/2020   LDLCALC 67 06/03/2020   TRIG 114 06/03/2020   CHOLHDL 2.9 06/03/2020   Lab Results  Component Value Date   NA 143 12/03/2020   K 4.9 12/03/2020   CREATININE 0.79 12/03/2020   EGFR 90 12/03/2020   GLUCOSE 93 12/03/2020   TSH 0.701 06/03/2020     The ASCVD Risk score (Arnett DK, et al., 2019) failed to calculate for the following reasons:   The 2019 ASCVD risk score is only  valid for ages 33 to 44   --------------------------------------------------------------------------------------------------- Diabetes Mellitus Type II, Follow-up  Lab Results  Component Value Date   HGBA1C 5.6 12/03/2020   HGBA1C 5.8 (H) 06/03/2020   HGBA1C 5.6 09/27/2019   Wt Readings from Last 3 Encounters:  04/08/21 237 lb (107.5 kg)  01/15/21 239 lb 3.2 oz (108.5 kg)  12/03/20 239 lb (108.4 kg)   Last seen for diabetes 4 months ago.  Management since then includes none. He reports good compliance with treatment. He is not having side effects.  Symptoms: No fatigue No foot ulcerations  No appetite changes No nausea  No paresthesia of the feet  No polydipsia  No polyuria No visual disturbances   No vomiting     Home blood sugar records:  not being checked regularly  Episodes of hypoglycemia? No    Current insulin regiment: none Most Recent Eye Exam: due  Pertinent Labs: Lab Results  Component Value Date   CHOL 135 06/03/2020   HDL 47 06/03/2020   LDLCALC 67 06/03/2020   TRIG 114 06/03/2020   CHOLHDL 2.9 06/03/2020   Lab Results  Component Value Date   NA 143 12/03/2020   K 4.9 12/03/2020   CREATININE 0.79 12/03/2020   EGFR 90 12/03/2020     ---------------------------------------------------------------------------------------------------   Medications: Outpatient Medications Prior to Visit  Medication Sig   amLODipine (NORVASC) 10 MG tablet Take 1 tablet by mouth once daily   aspirin 81 MG chewable tablet Chew 81 mg by mouth daily.   docusate sodium (COLACE) 250 MG capsule Take 250 mg by mouth daily as needed for constipation.    furosemide (LASIX) 40 MG tablet Take 1 tablet (40 mg total) by mouth 3 (three) times a week.   metoprolol tartrate (LOPRESSOR) 25 MG tablet Take 1 tablet (25 mg total) by mouth 2 (two) times daily.   montelukast (SINGULAIR) 10 MG tablet Take 1 tablet (10 mg total) by mouth at bedtime as needed (allergies).   Multiple Vitamin  (MULTIVITAMIN) tablet Take 1 tablet by mouth daily.   omeprazole (PRILOSEC) 20 MG capsule Take 20 mg by mouth daily. As needed   potassium chloride (KLOR-CON) 10 MEQ tablet Take 1 tablet (10 mEq total) by mouth 3 (three) times a week. Take with lasix   rosuvastatin (CRESTOR) 20 MG tablet Take 1 tablet by mouth once daily   tamsulosin (FLOMAX) 0.4 MG CAPS capsule Take 1 capsule by mouth once daily   Vitamins/Minerals TABS Take by mouth.    No facility-administered medications prior to visit.    Review of Systems     Objective    BP 134/68 (BP Location: Right Arm, Patient Position: Sitting, Cuff Size: Large)    Pulse (!) 50    Wt 237 lb (107.5 kg)    SpO2 95%    BMI 38.25 kg/m    Physical Exam Vitals reviewed.  Constitutional:      Appearance: Normal appearance.  HENT:     Head: Normocephalic and atraumatic.     Right Ear: External ear normal.     Left Ear: External ear normal.  Eyes:     General: No scleral icterus.    Conjunctiva/sclera: Conjunctivae normal.  Cardiovascular:     Rate and Rhythm: Normal rate and regular rhythm.     Pulses: Normal pulses.     Heart sounds: Normal heart sounds.     Comments: No carotid bruit Pulmonary:     Effort: Pulmonary effort is normal.     Breath sounds: Normal breath sounds.  Abdominal:     Palpations: Abdomen is soft.  Musculoskeletal:     Right lower leg: No edema.     Left lower leg: No edema.     Comments: Trace lower extremity edema  Skin:    General: Skin is warm and dry.  Neurological:     General: No focal deficit present.     Mental Status: He is alert and oriented to person, place, and time.  Psychiatric:        Mood and Affect: Mood normal.        Behavior: Behavior normal.        Thought Content: Thought content normal.        Judgment: Judgment normal.      No results found for any visits on 04/08/21.  Assessment & Plan     1. Essential hypertension Fairly good blood pressure control.  Follow home  readings.  They are running in the 130s to 140s at home but patient occasionally gets hypotensive so will not push the blood pressure control at this time.  Told him to stay hydrated. - TSH  2. Stage 3 chronic kidney disease, unspecified whether stage 3a or 3b CKD ([place]) Stay hydrated and avoid nonsteroidal - Comprehensive metabolic panel  3. Dyslipidemia On rosuvastatin - Lipid Panel With LDL/HDL  Ratio  4. Hyperglycemia Patient wishes to try over-the-counter go lo to help with weight loss - Hemoglobin A1c  5. OSA (obstructive sleep apnea)   6. Carotid stenosis, bilateral   7. Aortic valve stenosis, etiology of cardiac valve disease unspecified Followed by cardiology  8. Diverticulitis Clinically completely resolved.  9. RLS (restless legs syndrome) Try magnesium oxide at night.  Patient wishes no further intervention that at this time.   No follow-ups on file.      I, [name], MD, have reviewed all documentation for this visit. The documentation on 04/10/21 for the exam, diagnosis, procedures, and orders are all accurate and complete.    Halynn Reitano Cranford Mon, MD  [facility] [phone] (phone) [phone] (fax)  [place]

## 2021-04-08 ENCOUNTER — Ambulatory Visit (INDEPENDENT_AMBULATORY_CARE_PROVIDER_SITE_OTHER): Payer: Medicare Other | Admitting: Family Medicine

## 2021-04-08 ENCOUNTER — Other Ambulatory Visit: Payer: Self-pay

## 2021-04-08 VITALS — BP 134/68 | HR 50 | Wt 237.0 lb

## 2021-04-08 DIAGNOSIS — K5792 Diverticulitis of intestine, part unspecified, without perforation or abscess without bleeding: Secondary | ICD-10-CM

## 2021-04-08 DIAGNOSIS — I6523 Occlusion and stenosis of bilateral carotid arteries: Secondary | ICD-10-CM

## 2021-04-08 DIAGNOSIS — R739 Hyperglycemia, unspecified: Secondary | ICD-10-CM | POA: Diagnosis not present

## 2021-04-08 DIAGNOSIS — G2581 Restless legs syndrome: Secondary | ICD-10-CM

## 2021-04-08 DIAGNOSIS — E785 Hyperlipidemia, unspecified: Secondary | ICD-10-CM | POA: Diagnosis not present

## 2021-04-08 DIAGNOSIS — I10 Essential (primary) hypertension: Secondary | ICD-10-CM

## 2021-04-08 DIAGNOSIS — I350 Nonrheumatic aortic (valve) stenosis: Secondary | ICD-10-CM

## 2021-04-08 DIAGNOSIS — G4733 Obstructive sleep apnea (adult) (pediatric): Secondary | ICD-10-CM

## 2021-04-08 DIAGNOSIS — N183 Chronic kidney disease, stage 3 unspecified: Secondary | ICD-10-CM | POA: Diagnosis not present

## 2021-04-08 NOTE — Patient Instructions (Signed)
1.Take over the counter magnesium oxide in evening for your legs. 2. Make sure to return back to office 1-2 weeks prior to your next appointment to have labs drawn.     Restless Legs Syndrome Restless legs syndrome is a condition that causes uncomfortable feelings or sensations in the legs, especially while sitting or lying down. The sensations usually cause an overwhelming urge to move the legs. The arms can also sometimes be affected. The condition can range from mild to severe. The symptoms often interfere with a person's ability to sleep. What are the causes? The cause of this condition is not known. What increases the risk? The following factors may make you more likely to develop this condition: Being older than 50. Pregnancy. Being a woman. In general, the condition is more common in women than in men. A family history of the condition. Having iron deficiency. Overuse of caffeine, nicotine, or alcohol. Certain medical conditions, such as kidney disease, Parkinson's disease, or nerve damage. Certain medicines, such as those for high blood pressure, nausea, colds, allergies, depression, and some heart conditions. What are the signs or symptoms? The main symptom of this condition is uncomfortable sensations in the legs, such as: Pulling. Tingling. Prickling. Throbbing. Crawling. Burning. Usually, the sensations: Affect both sides of the body. Are worse when you sit or lie down. Are worse at night. These may make it difficult to fall asleep. Make you have a strong urge to move your legs. Are temporarily relieved by moving your legs or standing. The arms can also be affected, but this is rare. People who have this condition often have tiredness during the day because of their lack of sleep at night. How is this diagnosed? This condition may be diagnosed based on: Your symptoms. Blood tests. In some cases, you may be monitored in a sleep lab by a specialist (a sleep study).  This can detect any disruptions in your sleep. How is this treated? This condition is treated by managing the symptoms. This may include: Lifestyle changes, such as exercising, using relaxation techniques, and avoiding caffeine, alcohol, or tobacco. Iron supplements. Medicines. Parkinson's medications may be tried first. Anti-seizure medications can also be helpful. Follow these instructions at home: General instructions Take over-the-counter and prescription medicines only as told by your health care provider. Use methods to help relieve the uncomfortable sensations, such as: Massaging your legs. Walking or stretching. Taking a cold or hot bath. Keep all follow-up visits. This is important. Lifestyle   Practice good sleep habits. For example, go to bed and get up at the same time every day. Most adults should get 7-9 hours of sleep each night. Exercise regularly. Try to get at least 30 minutes of exercise most days of the week. Practice ways of relaxing, such as yoga or meditation. Avoid caffeine and alcohol. Do not use any products that contain nicotine or tobacco. These products include cigarettes, chewing tobacco, and vaping devices, such as e-cigarettes. If you need help quitting, ask your health care provider. Where to find more information [employer] of Neurological Disorders and Stroke: [employer] Contact a health care provider if: Your symptoms get worse or they do not improve with treatment. Summary Restless legs syndrome is a condition that causes uncomfortable feelings or sensations in the legs, especially while sitting or lying down. The symptoms often interfere with your ability to sleep. This condition is treated by managing the symptoms. You may need to make lifestyle changes or take medicines. This information is not intended to  replace advice given to you by your health care provider. Make sure you discuss any questions you have with your health care  provider. Document Revised: 09/08/2020 Document Reviewed: 09/08/2020 Elsevier Patient Education  [place].

## 2021-04-22 ENCOUNTER — Other Ambulatory Visit: Payer: Self-pay

## 2021-04-22 ENCOUNTER — Encounter: Payer: Self-pay | Admitting: Family Medicine

## 2021-04-22 ENCOUNTER — Ambulatory Visit
Admission: RE | Admit: 2021-04-22 | Discharge: 2021-04-22 | Disposition: A | Discharge status: Home / Self Care | Payer: Medicare Other | Attending: Family Medicine | Admitting: Family Medicine

## 2021-04-22 ENCOUNTER — Ambulatory Visit
Admission: RE | Admit: 2021-04-22 | Discharge: 2021-04-22 | Disposition: A | Discharge status: Home / Self Care | Payer: Medicare Other | Source: Ambulatory Visit | Attending: Family Medicine | Admitting: Family Medicine

## 2021-04-22 ENCOUNTER — Ambulatory Visit (INDEPENDENT_AMBULATORY_CARE_PROVIDER_SITE_OTHER): Payer: Medicare Other | Admitting: Family Medicine

## 2021-04-22 VITALS — BP 130/38 | HR 55 | Temp 98.3°F | Wt 239.0 lb

## 2021-04-22 DIAGNOSIS — I479 Paroxysmal tachycardia, unspecified: Secondary | ICD-10-CM

## 2021-04-22 DIAGNOSIS — R079 Chest pain, unspecified: Secondary | ICD-10-CM | POA: Diagnosis not present

## 2021-04-22 DIAGNOSIS — I2510 Atherosclerotic heart disease of native coronary artery without angina pectoris: Secondary | ICD-10-CM | POA: Insufficient documentation

## 2021-04-22 DIAGNOSIS — R0789 Other chest pain: Secondary | ICD-10-CM

## 2021-04-22 DIAGNOSIS — J9811 Atelectasis: Secondary | ICD-10-CM | POA: Diagnosis not present

## 2021-04-22 DIAGNOSIS — M159 Polyosteoarthritis, unspecified: Secondary | ICD-10-CM | POA: Diagnosis not present

## 2021-04-22 DIAGNOSIS — Z952 Presence of prosthetic heart valve: Secondary | ICD-10-CM | POA: Insufficient documentation

## 2021-04-22 NOTE — Assessment & Plan Note (Signed)
Hx of TAVR with normal EF ?F/b cards ?Pt does not exercise ?Is on 3x/week dose of diuretic, lasix 40 mg ? ?

## 2021-04-22 NOTE — Assessment & Plan Note (Signed)
Hx of 3 vessel CABG ?Remains on high dose statin, crestor at 20 mg ?Also on 81 mg ASA ?Does not have a Rx for nitro SL; encouraged to use 3 tablets of 81 mg ASA if pt develops CP or chest tightness and call 9-1-1/seek emergent care ?Reports 1-2 weeks of "chest twinges" into the L chest/L shoulder- PCP Rosanna Randy consulted, advised journaling of symptoms and RTC in 1 week ?Patient contracted for safety; pt and wife voiced understanding ?Last ECHO was normal, completed by cardiology  ?

## 2021-04-22 NOTE — Assessment & Plan Note (Signed)
Body mass index is 38.58 kg/m?. ?Associated with HTN, HLD, CAD ?Discussed importance of healthy weight management ?Discussed diet and exercise ? ?

## 2021-04-22 NOTE — Assessment & Plan Note (Signed)
Chronic, stable ?Denies flutters of heart or fast heart rate ?HR is low, 50s, bradycardic with use of beta blocker, metoprolol 25 mg BID ?Is followed by cardiology ?

## 2021-04-22 NOTE — Assessment & Plan Note (Signed)
Hx of generalized OA ?Encouraged APAP, 650 mg- 1000 mg dosing, max dose of 3 grams/day ?Also recommended use of topical anti-inflammatory, ex voltaren 2-4 g dosing, applied to site ?Cold packs advised 20 mins on/ 20 mins off if needed ?

## 2021-04-22 NOTE — Progress Notes (Signed)
Acute Office Visit  I,Elena D DeSanto,acting as a scribe for Jacky Kindle, FNP.,have documented all relevant documentation on the behalf of Jacky Kindle, FNP,as directed by  Jacky Kindle, FNP while in the presence of Jacky Kindle, FNP.  Subjective:    Patient ID: Jacob Montes, male    DOB: 20-Sep-1940, 81 y.o.   MRN: [id number]  Introduced to nurse practitioner role and practice setting.  All questions answered.  Discussed provider/patient relationship and expectations. Seen today for acute concern of "knot" on L neck with associated pain.   HPI   Patient is in today for evaluation of knot on neck/frontal left.  Pt notices an dull  pain and sometimes sharp pain that comes a goes.  Pt noted pain 2-3 weeks ago; and knot this past weekend.    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 s/p 3-v CABG in 07/2007   Carotid artery disease (HCC)    a. 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   GERD (gastroesophageal reflux disease)    HLD (hyperlipidemia)    Hyperlipidemia 10/15/2015   Hypertension    Nephrolithiasis 11/16/2018   Pulmonary embolism (HCC) 08/2010   Skin cancer    Sleep apnea    Stroke [facility])     Past Surgical History:  Procedure Laterality Date   Carotid surgery Left 03/08/2007   CHOLECYSTECTOMY  02/16/2018   ERCP N/A 11/18/2018   Procedure: ENDOSCOPIC RETROGRADE CHOLANGIOPANCREATOGRAPHY (ERCP);  Surgeon: Midge Minium, MD;  Location: [facility] ENDOSCOPY;  Service: Endoscopy;  Laterality: N/A;   Heart Bypass N/A 07/2007   left shoulder surgery Left 06/03/2009   RIGHT AND LEFT HEART CATH  07/18/2007   right shoulder surgery  2003   TEE WITHOUT CARDIOVERSION N/A 11/21/2018   Procedure: TRANSESOPHAGEAL ECHOCARDIOGRAM (TEE);  Surgeon: Iran Ouch, MD;  Location: [facility] ORS;   Service: Cardiovascular;  Laterality: N/A;   TVAR      Family History  Problem Relation Age of Onset   Hypertension Mother    Stroke Mother    Lung cancer Father    Non-Hodgkin's lymphoma Brother     Social History   Socioeconomic History   Marital status: Married    Spouse name: Not on file   Number of children: 2   Years of education: Not on file   Highest education level: Some college, no degree  Occupational History   Occupation: retired  Tobacco Use   Smoking status: Former    Types: Cigarettes   Smokeless tobacco: Never   Tobacco comments:    Quit in 1979  Vaping Use   Vaping Use: Never used  Substance and Sexual Activity   Alcohol use: Never   Drug use: Never   Sexual activity: Yes    Partners: Female  Other Topics Concern   Not on file  Social History Narrative   Not on file   Social Determinants of Health   Financial Resource Strain: Not on file  Food Insecurity: Not on file  Transportation Needs: Not on file  Physical Activity: Not on file  Stress: Not on file  Social Connections: Not on file  Intimate Partner Violence: Not on file    Outpatient Medications Prior to Visit  Medication Sig Dispense Refill   amLODipine (NORVASC)  10 MG tablet Take 1 tablet by mouth once daily 90 tablet 1   aspirin 81 MG chewable tablet Chew 81 mg by mouth daily.     docusate sodium (COLACE) 250 MG capsule Take 250 mg by mouth daily as needed for constipation.      furosemide (LASIX) 40 MG tablet Take 1 tablet (40 mg total) by mouth 3 (three) times a week. 39 tablet 3   metoprolol tartrate (LOPRESSOR) 25 MG tablet Take 1 tablet (25 mg total) by mouth 2 (two) times daily. 180 tablet 3   montelukast (SINGULAIR) 10 MG tablet Take 1 tablet (10 mg total) by mouth at bedtime as needed (allergies). 30 tablet 3   Multiple Vitamin (MULTIVITAMIN) tablet Take 1 tablet by mouth daily.     omeprazole (PRILOSEC) 20 MG capsule Take 20 mg by mouth daily. As  needed     rosuvastatin (CRESTOR) 20 MG tablet Take 1 tablet by mouth once daily 90 tablet 3   tamsulosin (FLOMAX) 0.4 MG CAPS capsule Take 1 capsule by mouth once daily 90 capsule 0   Vitamins/Minerals TABS Take by mouth.      potassium chloride (KLOR-CON) 10 MEQ tablet Take 1 tablet (10 mEq total) by mouth 3 (three) times a week. Take with lasix 90 tablet 3   No facility-administered medications prior to visit.    Allergies  Allergen Reactions   Atorvastatin Other (See Comments)    aches   Ramipril Other (See Comments)    cough    Review of Systems     Objective:    Physical Exam Vitals and nursing note reviewed.  Constitutional:      Appearance: Normal appearance. He is obese.  HENT:     Head: Normocephalic and atraumatic.  Eyes:     Pupils: Pupils are equal, round, and reactive to light.  Neck:     Vascular: Carotid bruit present.      Comments: R side bruit, f/b with annual [place] Hx of L side CEA Area tenderness outlined in pink, surrounding clavicular joint At base of CEA incision and includes top of MSI from CABG Cardiovascular:     Rate and Rhythm: Normal rate and regular rhythm.     Pulses: Normal pulses.     Heart sounds: Normal heart sounds.  Pulmonary:     Effort: Pulmonary effort is normal.     Breath sounds: Normal breath sounds.  Musculoskeletal:        General: Normal range of motion.     Cervical back: Neck supple. Tenderness present. No rigidity.  Lymphadenopathy:     Cervical: No cervical adenopathy.  Skin:    General: Skin is warm and dry.     Capillary Refill: Capillary refill takes less than 2 seconds.  Neurological:     General: No focal deficit present.     Mental Status: He is alert and oriented to person, place, and time. Mental status is at baseline.  Psychiatric:        Mood and Affect: Mood normal.        Behavior: Behavior normal.        Thought Content: Thought content normal.        Judgment: Judgment normal.    BP (!)  130/38 (BP Location: Right Arm, Cuff Size: Large)   Pulse (!) 55   Temp 98.3 F (36.8 C) (Oral)   Wt 239 lb (108.4 kg)   SpO2 96%   BMI 38.58 kg/m  Wt Readings from Last 3  Encounters:  04/22/21 239 lb (108.4 kg)  04/08/21 237 lb (107.5 kg)  01/15/21 239 lb 3.2 oz (108.5 kg)    Health Maintenance Due  Topic Date Due   Zoster Vaccines- Shingrix (1 of 2) Never done   COVID-19 Vaccine (3 - Pfizer risk series) 04/08/2019    There are no preventive care reminders to display for this patient.   Lab Results  Component Value Date   TSH 0.701 06/03/2020   Lab Results  Component Value Date   WBC 5.5 12/03/2020   HGB 13.7 12/03/2020   HCT 40.5 12/03/2020   MCV 96 12/03/2020   PLT 135 (L) 12/03/2020   Lab Results  Component Value Date   NA 143 12/03/2020   K 4.9 12/03/2020   CO2 26 12/03/2020   GLUCOSE 93 12/03/2020   BUN 12 12/03/2020   CREATININE 0.79 12/03/2020   BILITOT 0.6 12/03/2020   ALKPHOS 51 12/03/2020   AST 42 (H) 12/03/2020   ALT 41 12/03/2020   PROT 6.2 12/03/2020   ALBUMIN 4.1 12/03/2020   CALCIUM 9.1 12/03/2020   ANIONGAP 7 11/21/2018   EGFR 90 12/03/2020   Lab Results  Component Value Date   CHOL 135 06/03/2020   Lab Results  Component Value Date   HDL 47 06/03/2020   Lab Results  Component Value Date   LDLCALC 67 06/03/2020   Lab Results  Component Value Date   TRIG 114 06/03/2020   Lab Results  Component Value Date   CHOLHDL 2.9 06/03/2020   Lab Results  Component Value Date   HGBA1C 5.6 12/03/2020       Assessment & Plan:   Problem List Items Addressed This Visit       Cardiovascular and Mediastinum   Coronary artery disease involving native coronary artery of native heart without angina pectoris    Hx of 3 vessel CABG Remains on high dose statin, crestor at 20 mg Also on 81 mg ASA Does not have a Rx for nitro SL; encouraged to use 3 tablets of 81 mg ASA if pt develops CP or chest tightness and call 9-1-1/seek emergent  care Reports 1-2 weeks of "chest twinges" into the L chest/L shoulder- PCP Sullivan Lone consulted, advised journaling of symptoms and RTC in 1 week Patient contracted for safety; pt and wife voiced understanding Last ECHO was normal, completed by cardiology       Paroxysmal tachycardia (HCC)    Chronic, stable Denies flutters of heart or fast heart rate HR is low, 50s, bradycardic with use of beta blocker, metoprolol 25 mg BID Is followed by cardiology        Musculoskeletal and Integument   Generalized osteoarthritis    Hx of generalized OA Encouraged APAP, 650 mg- 1000 mg dosing, max dose of 3 grams/day Also recommended use of topical anti-inflammatory, ex voltaren 2-4 g dosing, applied to site Cold packs advised 20 mins on/ 20 mins off if needed        Other   Atypical chest pain - Primary    Complaints of "twinge" at L sternal clavicular joint, roughly 4 cm round Area with scar tissue from L CEA and MSI from CABG EKG completed 3/14 1608 due to complaints of what sounds to be atypical CP or what pt has referred to as a "twinge" EKG interpretation shows bradycardia, HR of 52, with associated IVCD. Stable rhythm. Non-acute presentation. This report is similar to previous EKG completed at cardiology in 08/2020, which also showed a SB rhythm  with ICVD, lower HR than today. Will refer for CXR for 2 view imaging; with plan for Chest CT if needed based on findings of 2 view CXR film series. 1 week RTC follow up with PCP with use of home logs of associated symptoms. Recommend use of APAP, voltaren and ice to assist. Seeking emergent care if needed given hx of CAD with revascularization.       Relevant Orders   EKG 12-Lead (Completed)   CT Chest Wo Contrast   DG Chest 2 View   Comprehensive metabolic panel   Troponin I (High Sensitivity)   History of transcatheter aortic valve replacement (TAVR)    Hx of TAVR with normal EF F/b cards Pt does not exercise Is on 3x/week dose of  diuretic, lasix 40 mg       Morbid obesity (HCC)    Body mass index is 38.58 kg/m. Associated with HTN, HLD, CAD Discussed importance of healthy weight management Discussed diet and exercise        I, Jacky Kindle, FNP, have reviewed all documentation for this visit. The documentation on 04/22/21 for the exam, diagnosis, procedures, and orders are all accurate and complete.

## 2021-04-22 NOTE — Assessment & Plan Note (Addendum)
Complaints of "twinge" at L sternal clavicular joint, roughly 4 cm round ?Area with scar tissue from L CEA and MSI from CABG ?EKG completed 3/14 1608 due to complaints of what sounds to be atypical CP or what pt has referred to as a "twinge" ?EKG interpretation shows bradycardia, HR of 52, with associated IVCD. ?Stable rhythm. Non-acute presentation. ?This report is similar to previous EKG completed at cardiology in 08/2020, which also showed a SB rhythm with ICVD, lower HR than today. ?Will refer for CXR for 2 view imaging; with plan for Chest CT if needed based on findings of 2 view CXR film series. ?1 week RTC follow up with PCP with use of home logs of associated symptoms. ?Recommend use of APAP, voltaren and ice to assist. Seeking emergent care if needed given hx of CAD with revascularization.  ?

## 2021-04-23 LAB — COMPREHENSIVE METABOLIC PANEL
ALT: 26 IU/L (ref 0–44)
AST: 32 IU/L (ref 0–40)
Albumin/Globulin Ratio: 2 (ref 1.2–2.2)
Albumin: 4.5 g/dL (ref 3.7–4.7)
Alkaline Phosphatase: 55 IU/L (ref 44–121)
BUN/Creatinine Ratio: 27 — ABNORMAL HIGH (ref 10–24)
BUN: 25 mg/dL (ref 8–27)
Bilirubin Total: 0.9 mg/dL (ref 0.0–1.2)
CO2: 25 mmol/L (ref 20–29)
Calcium: 9.8 mg/dL (ref 8.6–10.2)
Chloride: 102 mmol/L (ref 96–106)
Creatinine, Ser: 0.92 mg/dL (ref 0.76–1.27)
Globulin, Total: 2.2 g/dL (ref 1.5–4.5)
Glucose: 141 mg/dL — ABNORMAL HIGH (ref 70–99)
Potassium: 4.7 mmol/L (ref 3.5–5.2)
Sodium: 141 mmol/L (ref 134–144)
Total Protein: 6.7 g/dL (ref 6.0–8.5)
eGFR: 84 mL/min/{1.73_m2} (ref >59)

## 2021-04-29 ENCOUNTER — Ambulatory Visit (INDEPENDENT_AMBULATORY_CARE_PROVIDER_SITE_OTHER): Payer: Medicare Other | Admitting: Family Medicine

## 2021-04-29 ENCOUNTER — Other Ambulatory Visit: Payer: Self-pay

## 2021-04-29 ENCOUNTER — Encounter: Payer: Self-pay | Admitting: Family Medicine

## 2021-04-29 VITALS — BP 155/67 | HR 47 | Temp 98.1°F | Resp 18 | Ht 66.0 in | Wt 240.0 lb

## 2021-04-29 DIAGNOSIS — N183 Chronic kidney disease, stage 3 unspecified: Secondary | ICD-10-CM

## 2021-04-29 DIAGNOSIS — G4733 Obstructive sleep apnea (adult) (pediatric): Secondary | ICD-10-CM

## 2021-04-29 DIAGNOSIS — I2510 Atherosclerotic heart disease of native coronary artery without angina pectoris: Secondary | ICD-10-CM | POA: Diagnosis not present

## 2021-04-29 DIAGNOSIS — R0789 Other chest pain: Secondary | ICD-10-CM | POA: Diagnosis not present

## 2021-04-29 DIAGNOSIS — Z952 Presence of prosthetic heart valve: Secondary | ICD-10-CM

## 2021-04-29 DIAGNOSIS — M159 Polyosteoarthritis, unspecified: Secondary | ICD-10-CM

## 2021-04-29 DIAGNOSIS — R079 Chest pain, unspecified: Secondary | ICD-10-CM | POA: Diagnosis not present

## 2021-04-29 NOTE — Progress Notes (Signed)
?  ? ? ?Established patient visit ? ?I,[name],acting as a scribe for [name], MD.,have documented all relevant documentation on the behalf of [name], MD,as directed by  [name], MD while in the presence of [name], MD. ? ? ?Patient: Jacob Montes   DOB: 10/03/1940   81 y.o. Male  MRN: [id number] ?Visit Date: 04/29/2021 ? ?Today's healthcare provider: [name], MD  ? ?Chief Complaint  ?Patient presents with  ? Follow-up  ? ?Subjective  ?  ?HPI  ?Chest pain is not exertional.  Is in the left upper chest and occasionally has a twinge towards the left shoulder.  No associated symptoms.  It does not sound like angina he had before his bypass. ?Chest x-ray was negative and he has a CT scheduled for tomorrow. ?Follow up for Atypical chest pain: ? ?The patient was last seen for this 1 weeks ago. ?Changes made at last visit include; seen by Tally Joe. Chest x-ray obtained and chest ct scan ordered but not done yet. ? ?He reports good compliance with treatment. ?He feels that condition is Unchanged. ?He is not having side effects. none ? ?----------------------------------------------------------------------------------------- ? ? ?Medications: ?Outpatient Medications Prior to Visit  ?Medication Sig  ? amLODipine (NORVASC) 10 MG tablet Take 1 tablet by mouth once daily  ? aspirin 81 MG chewable tablet Chew 81 mg by mouth daily.  ? docusate sodium (COLACE) 250 MG capsule Take 250 mg by mouth daily as needed for constipation.   ? furosemide (LASIX) 40 MG tablet Take 1 tablet (40 mg total) by mouth 3 (three) times a week.  ? metoprolol tartrate (LOPRESSOR) 25 MG tablet Take 1 tablet (25 mg total) by mouth 2 (two) times daily.  ? montelukast (SINGULAIR) 10 MG tablet Take 1 tablet (10 mg total) by mouth at bedtime as needed (allergies).  ? Multiple Vitamin (MULTIVITAMIN) tablet Take 1 tablet by mouth daily.  ? omeprazole (PRILOSEC) 20 MG capsule Take 20 mg by mouth daily.  As needed  ? rosuvastatin (CRESTOR) 20 MG tablet Take 1 tablet by mouth once daily  ? tamsulosin (FLOMAX) 0.4 MG CAPS capsule Take 1 capsule by mouth once daily  ? Vitamins/Minerals TABS Take by mouth.   ? potassium chloride (KLOR-CON) 10 MEQ tablet Take 1 tablet (10 mEq total) by mouth 3 (three) times a week. Take with lasix  ? ?No facility-administered medications prior to visit.  ? ? ?Review of Systems  ?Constitutional:  Negative for appetite change, chills and fever.  ?Respiratory:  Negative for chest tightness, shortness of breath and wheezing.   ?Cardiovascular:  Negative for chest pain and palpitations.  ?Gastrointestinal:  Negative for abdominal pain, nausea and vomiting.  ? ?  ?  Objective  ?  ?BP (!) 155/67 (BP Location: Left Arm, Patient Position: Sitting, Cuff Size: Large)   Pulse (!) 47   Temp 98.1 ?F (36.7 ?C) (Temporal)   Resp 18   Ht '5\' 6"'$  (1.676 m)   Wt 240 lb (108.9 kg)   SpO2 97%   BMI 38.74 kg/m?  ?BP Readings from Last 3 Encounters:  ?04/29/21 (!) 155/67  ?04/22/21 (!) 130/38  ?04/08/21 134/68  ? ?Wt Readings from Last 3 Encounters:  ?04/29/21 240 lb (108.9 kg)  ?04/22/21 239 lb (108.4 kg)  ?04/08/21 237 lb (107.5 kg)  ? ?  ? ?Physical Exam ?Vitals reviewed.  ?Constitutional:   ?   Appearance: Normal appearance.  ?HENT:  ?   Head: Normocephalic and  atraumatic.  ?   Right Ear: External ear normal.  ?   Left Ear: External ear normal.  ?Eyes:  ?   General: No scleral icterus. ?   Conjunctiva/sclera: Conjunctivae normal.  ?Cardiovascular:  ?   Rate and Rhythm: Normal rate and regular rhythm.  ?   Pulses: Normal pulses.  ?   Heart sounds: Normal heart sounds.  ?   Comments: No carotid bruit ?Pulmonary:  ?   Effort: Pulmonary effort is normal.  ?   Breath sounds: Normal breath sounds.  ?Abdominal:  ?   Palpations: Abdomen is soft.  ?Musculoskeletal:  ?   Right lower leg: No edema.  ?   Left lower leg: No edema.  ?   Comments: Trace lower extremity edema ?Mild enlargement of the left  sternoclavicular joint.  ?Skin: ?   General: Skin is warm and dry.  ?Neurological:  ?   General: No focal deficit present.  ?   Mental Status: He is alert and oriented to person, place, and time.  ?Psychiatric:     ?   Mood and Affect: Mood normal.     ?   Behavior: Behavior normal.     ?   Thought Content: Thought content normal.     ?   Judgment: Judgment normal.  ?  ?ECG reveals sinus bradycardia of about 54 with intraventricular conduction defect without ischemic changes. ? ?No results found for any visits on 04/29/21. ? Assessment & Plan  ?  ? ?1. Chest pain, unspecified type ?CT pending ?- EKG 12-Lead ? ?2. Atypical chest pain ?After CT Will refer back to cardiology for patient peace of mind.  This appears to be musculoskeletal. ?- Ambulatory referral to Cardiology ? ?3. Coronary artery disease involving native coronary artery of native heart without angina pectoris ?All risk factors treated ? ?4. 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 ?5. OSA (obstructive sleep apnea) ?On CPAP ? ?6. Generalized osteoarthritis ? ? ?7. S/P TAVR (transcatheter aortic valve replacement) ? ? ? ?No follow-ups on file.  ?   ? ?I, [name], MD, have reviewed all documentation for this visit. The documentation on 05/01/21 for the exam, diagnosis, procedures, and orders are all accurate and complete. ? ? ? ?Adely Facer Cranford Mon, MD  ?[facility] ?[phone] (phone) ?[phone] (fax) ? ?[facility] ?

## 2021-04-29 NOTE — Patient Instructions (Signed)
CUT BACK ON METOPROLOL 25 MG TO 1/2 TWICE A DAY. ?

## 2021-05-01 ENCOUNTER — Other Ambulatory Visit: Payer: Self-pay

## 2021-05-01 ENCOUNTER — Ambulatory Visit
Admission: RE | Admit: 2021-05-01 | Discharge: 2021-05-01 | Disposition: A | Discharge status: Home / Self Care | Payer: Medicare Other | Source: Ambulatory Visit | Attending: Family Medicine | Admitting: Family Medicine

## 2021-05-01 DIAGNOSIS — R0789 Other chest pain: Secondary | ICD-10-CM | POA: Diagnosis not present

## 2021-05-01 DIAGNOSIS — I700 Atherosclerosis of aorta: Secondary | ICD-10-CM | POA: Diagnosis not present

## 2021-05-19 ENCOUNTER — Other Ambulatory Visit: Payer: Self-pay | Admitting: Family Medicine

## 2021-05-19 DIAGNOSIS — R109 Unspecified abdominal pain: Secondary | ICD-10-CM

## 2021-05-21 ENCOUNTER — Other Ambulatory Visit: Payer: Self-pay | Admitting: Family Medicine

## 2021-05-21 NOTE — Telephone Encounter (Signed)
Requested Prescriptions  ?Pending Prescriptions Disp Refills  ?? rosuvastatin (CRESTOR) 20 MG tablet [Pharmacy Med Name: Rosuvastatin Calcium 20 MG Oral Tablet] 90 tablet 0  ?  Sig: Take 1 tablet by mouth once daily  ?  ? Cardiovascular:  Antilipid - Statins 2 Failed - 05/21/2021  9:35 AM  ?  ?  Failed - Lipid Panel in normal range within the last 12 months  ?  Cholesterol, Total  ?Date Value Ref Range Status  ?06/03/2020 135 100 - 199 mg/dL Final  ? ?LDL Chol Calc (NIH)  ?Date Value Ref Range Status  ?06/03/2020 67 0 - 99 mg/dL Final  ? ?HDL  ?Date Value Ref Range Status  ?06/03/2020 47 >39 mg/dL Final  ? ?Triglycerides  ?Date Value Ref Range Status  ?06/03/2020 114 0 - 149 mg/dL Final  ? ?  ?  ?  Passed - Cr in normal range and within 360 days  ?  Creatinine, Ser  ?Date Value Ref Range Status  ?04/22/2021 0.92 0.76 - 1.27 mg/dL Final  ?   ?  ?  Passed - Patient is not pregnant  ?  ?  Passed - Valid encounter within last 12 months  ?  Recent Outpatient Visits   ?      ? 3 weeks ago Chest pain, unspecified type  ? [facility] Jerrol Banana., MD  ? 4 weeks ago Atypical chest pain  ? [place], FNP  ? 1 month ago Essential hypertension  ? [facility] Jerrol Banana., MD  ? 5 months ago Diverticulitis  ? [facility] Jerrol Banana., MD  ? 7 months ago Abdominal pain, unspecified abdominal location  ? [facility] Jerrol Banana., MD  ?  ?  ?Future Appointments   ?        ? In 2 months Gollan, Kathlene November, MD [facility]  ? In 3 months Ralene Bathe, MD [place]  ? In 4 months Jerrol Banana., MD [facility], [facility]  ?  ? ?  ?  ?  ? ? ?

## 2021-05-29 ENCOUNTER — Telehealth: Payer: Self-pay | Admitting: Family Medicine

## 2021-05-29 NOTE — Telephone Encounter (Signed)
Copied from [place] [phone]. Topic: Medicare AWV ?>> May 29, 2021  1:53 PM Cher Nakai R wrote: ?Reason for CRM:  ?Left message for patient to call back and schedule Medicare Annual Wellness Visit (AWV) in office.  ? ?If unable to come into the office for AWV,  please offer to do virtually or by telephone. ? ?Last AWV:   08/01/2019 ? ?Please schedule at anytime with [facility] Health Advisor. ? ?30 minute appointment for Virtual or phone ?45 minute appointment for in office or Initial virtual/phone ? ?Any questions, please contact me at [phone] ?

## 2021-06-03 ENCOUNTER — Other Ambulatory Visit: Payer: Self-pay | Admitting: Family Medicine

## 2021-06-03 DIAGNOSIS — I10 Essential (primary) hypertension: Secondary | ICD-10-CM

## 2021-06-17 ENCOUNTER — Ambulatory Visit (INDEPENDENT_AMBULATORY_CARE_PROVIDER_SITE_OTHER): Payer: Medicare Other

## 2021-06-17 VITALS — Wt 240.0 lb

## 2021-06-17 DIAGNOSIS — Z0000 Encounter for general adult medical examination without abnormal findings: Secondary | ICD-10-CM

## 2021-06-17 NOTE — Progress Notes (Signed)
Virtual Visit via Telephone Note  I connected with  Jacob Montes on 06/17/21 at 10:30 AM EDT by telephone and verified that I am speaking with the correct person using two identifiers.  Location: Patient: home Provider: [facility] Persons participating in the virtual visit: [place]   I discussed the limitations, risks, security and privacy concerns of performing an evaluation and management service by telephone and the availability of in person appointments. The patient expressed understanding and agreed to proceed.  Interactive audio and video telecommunications were attempted between this nurse and patient, however failed, due to patient having technical difficulties OR patient did not have access to video capability.  We continued and completed visit with audio only.  Some vital signs may be absent or patient reported.   Dionisio David, LPN  Subjective:   Jacob Montes is a 81 y.o. male who presents for Medicare Annual/Subsequent preventive examination.  Review of Systems           Objective:    There were no vitals filed for this visit. There is no height or weight on file to calculate BMI.     08/01/2019    1:36 PM 11/17/2018    4:00 PM  Advanced Directives  Does Patient Have a Medical Advance Directive? Yes Yes  Type of Paramedic of [place];Living will Living will;Healthcare Power of Attorney  Does patient want to make changes to medical advance directive?  No - Patient declined  Copy of [place] in Chart? No - copy requested No - copy requested    Current Medications (verified) Outpatient Encounter Medications as of 06/17/2021  Medication Sig   amLODipine (NORVASC) 10 MG tablet Take 1 tablet by mouth once daily   aspirin 81 MG chewable tablet Chew 81 mg by mouth daily.   docusate sodium (COLACE) 250 MG capsule Take 250 mg by mouth daily as needed for constipation.    doxycycline (VIBRA-TABS) 100 MG tablet  Take 1 tablet by mouth twice daily for 5 days   furosemide (LASIX) 40 MG tablet Take 1 tablet (40 mg total) by mouth 3 (three) times a week.   metoprolol tartrate (LOPRESSOR) 25 MG tablet Take 1 tablet (25 mg total) by mouth 2 (two) times daily.   montelukast (SINGULAIR) 10 MG tablet Take 1 tablet (10 mg total) by mouth at bedtime as needed (allergies).   Multiple Vitamin (MULTIVITAMIN) tablet Take 1 tablet by mouth daily.   omeprazole (PRILOSEC) 20 MG capsule Take 20 mg by mouth daily. As needed   potassium chloride (KLOR-CON) 10 MEQ tablet Take 1 tablet (10 mEq total) by mouth 3 (three) times a week. Take with lasix   rosuvastatin (CRESTOR) 20 MG tablet Take 1 tablet by mouth once daily   tamsulosin (FLOMAX) 0.4 MG CAPS capsule Take 1 capsule by mouth once daily   Vitamins/Minerals TABS Take by mouth.    No facility-administered encounter medications on file as of 06/17/2021.    Allergies (verified) Atorvastatin and Ramipril   History: 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 s/p 3-v CABG in 07/2007   Carotid artery disease ([place])    a. 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   GERD (gastroesophageal reflux disease)    HLD (hyperlipidemia)  Hyperlipidemia 10/15/2015   Hypertension    Nephrolithiasis 11/16/2018   Pulmonary embolism ([place]) 08/2010   Skin cancer    Sleep apnea    Stroke [facility])    Past Surgical History:  Procedure Laterality Date   Carotid surgery Left 03/08/2007   CHOLECYSTECTOMY  02/16/2018   ERCP N/A 11/18/2018   Procedure: ENDOSCOPIC RETROGRADE CHOLANGIOPANCREATOGRAPHY (ERCP);  Surgeon: [name], MD;  Location: [facility] ENDOSCOPY;  Service: Endoscopy;  Laterality: N/A;   Heart Bypass N/A 07/2007   left shoulder surgery Left 06/03/2009   RIGHT AND LEFT HEART CATH  07/18/2007   right  shoulder surgery  2003   TEE WITHOUT CARDIOVERSION N/A 11/21/2018   Procedure: TRANSESOPHAGEAL ECHOCARDIOGRAM (TEE);  Surgeon: Wellington Hampshire, MD;  Location: [facility] ORS;  Service: Cardiovascular;  Laterality: N/A;   TVAR     Family History  Problem Relation Age of Onset   Hypertension Mother    Stroke Mother    Lung cancer Father    Non-Hodgkin's lymphoma Brother    Social History   Socioeconomic History   Marital status: Married    Spouse name: Not on file   Number of children: 2   Years of education: Not on file   Highest education level: Some college, no degree  Occupational History   Occupation: retired  Tobacco Use   Smoking status: Former    Types: Cigarettes   Smokeless tobacco: Never   Tobacco comments:    Quit in [place] Use: Never used  Substance and Sexual Activity   Alcohol use: Never   Drug use: Never   Sexual activity: Yes    Partners: Female  Other Topics Concern   Not on file  Social History Narrative   Not on file   Social Determinants of Health   Financial Resource Strain: Not on file  Food Insecurity: Not on file  Transportation Needs: Not on file  Physical Activity: Not on file  Stress: Not on file  Social Connections: Not on file    Tobacco Counseling Counseling given: Not Answered Tobacco comments: Quit in 1979   Clinical Intake:  Pre-visit preparation completed: Yes  Pain : No/denies pain     Nutritional Risks: None Diabetes: No  How often do you need to have someone help you when you read instructions, pamphlets, or other written materials from your doctor or pharmacy?: 1 - Never  Diabetic?no  Interpreter Needed?: No  Information entered by :: Kirke Shaggy, LPN   Activities of Daily Living    06/13/2021    2:55 PM 04/22/2021    3:51 PM  In your present state of health, do you have any difficulty performing the following activities:  Hearing? 0 0  Vision? 0 0  Difficulty concentrating or making  decisions? 0 0  Walking or climbing stairs? 0 0  Dressing or bathing? 0 0  Doing errands, shopping? 0 0  Preparing Food and eating ? N   Using the Toilet? N   In the past six months, have you accidently leaked urine? N   Do you have problems with loss of bowel control? N   Managing your Medications? N   Managing your Finances? N   Housekeeping or managing your Housekeeping? N     Patient Care Team: Jerrol Banana., MD as PCP - General (Family Medicine) [facility], [facility])  Indicate any recent Medical Services you may have received from other than [facility] providers in the past year (  date may be approximate).     Assessment:   This is a routine wellness examination for [place].  Hearing/Vision screen No results found.  Dietary issues and exercise activities discussed:     Goals Addressed   None    Depression Screen    04/22/2021    3:50 PM 08/19/2020    8:26 AM 06/03/2020    2:09 PM 08/01/2019    1:32 PM 02/16/2019   10:03 AM 10/24/2018   10:24 AM  PHQ 2/9 Scores  PHQ - 2 Score 0 0 0 0 0 0  PHQ- 9 Score 0 0 0  0 0    Fall Risk    06/13/2021    2:55 PM 04/22/2021    3:50 PM 08/19/2020    8:25 AM 08/01/2019    1:36 PM 02/16/2019   10:03 AM  [place] in the past year? 0 0 0 0 0  Number falls in past yr:  0 0 0 0  Injury with Fall?  0 0 0 0  Follow up   Falls evaluation completed  Falls evaluation completed    FALL RISK PREVENTION PERTAINING TO THE HOME:  Any stairs in or around the home? No  If so, are there any without handrails? No  Home free of loose throw rugs in walkways, pet beds, electrical cords, etc? Yes  Adequate lighting in your home to reduce risk of falls? Yes   ASSISTIVE DEVICES UTILIZED TO PREVENT FALLS:  Life alert? Yes  Use of a cane, walker or w/c? No  Grab bars in the bathroom? Yes  Shower chair or bench in shower? Yes  Elevated toilet seat or a handicapped toilet? No   Cognitive Function:        02/16/2019    10:07 AM  6CIT Screen  What Year? 0 points  What month? 0 points  What time? 0 points  Count back from 20 2 points  Months in reverse 0 points  Repeat phrase 2 points  Total Score 4 points    Immunizations Immunization History  Administered Date(s) Administered   Influenza Split 11/18/2009, 11/14/2010, 11/16/2011, 12/02/2012, 11/24/2013, 12/04/2014, 11/15/2017   Influenza, High Dose Seasonal PF 12/27/2015, 11/10/2016, 11/15/2017, 11/25/2017, 10/24/2018   Influenza, Seasonal, Injecte, Preservative Fre 11/16/2011, 12/02/2012, 11/24/2013   Influenza,inj,Quad PF,6+ Mos 12/05/2014   Influenza,inj,quad, With Preservative 12/05/2014   Influenza-Unspecified 11/16/2020   PFIZER(Purple Top)SARS-COV-2 Vaccination 02/15/2019, 03/11/2019   Pneumococcal Conjugate-13 09/14/2013   Pneumococcal Polysaccharide-23 03/03/2007   Tdap 05/01/2011   Tetanus 10/19/1985, 03/03/2007   Zoster, Live 03/08/2007    TDAP status: Due, Education has been provided regarding the importance of this vaccine. Advised may receive this vaccine at local pharmacy or Health Dept. Aware to provide a copy of the vaccination record if obtained from local pharmacy or Health Dept. Verbalized acceptance and understanding.  Flu Vaccine status: Up to date  Pneumococcal vaccine status: Up to date  Covid-19 vaccine status: Completed vaccines  Qualifies for Shingles Vaccine? Yes   Zostavax completed Yes   Shingrix Completed?: No.    Education has been provided regarding the importance of this vaccine. Patient has been advised to call insurance company to determine out of pocket expense if they have not yet received this vaccine. Advised may also receive vaccine at local pharmacy or Health Dept. Verbalized acceptance and understanding.  Screening Tests Health Maintenance  Topic Date Due   Zoster Vaccines- Shingrix (1 of 2) Never done   COVID-19 Vaccine (3 - [employer]  risk series) 04/08/2019   TETANUS/TDAP  04/30/2021   INFLUENZA  VACCINE  09/09/2021   Pneumonia Vaccine 9+ Years old  Completed   HPV VACCINES  Aged Out    Health Maintenance  Health Maintenance Due  Topic Date Due   Zoster Vaccines- Shingrix (1 of 2) Never done   COVID-19 Vaccine (3 - [employer] risk series) 04/08/2019   TETANUS/TDAP  04/30/2021    Colorectal cancer screening: No longer required.   Lung Cancer Screening: (Low Dose CT Chest recommended if Age 62-80 years, 30 pack-year currently smoking OR have quit w/in 15years.) does not qualify.   Additional Screening:  Hepatitis C Screening: does not qualify; Completed no  Vision Screening: Recommended annual ophthalmology exams for early detection of glaucoma and other disorders of the eye. Is the patient up to date with their annual eye exam?  Yes  Who is the provider or what is the name of the office in which the patient attends annual eye exams? [facility] If pt is not established with a provider, would they like to be referred to a provider to establish care? No .   Dental Screening: Recommended annual dental exams for proper oral hygiene  Community Resource Referral / Chronic Care Management: CRR required this visit?  No   CCM required this visit?  No      Plan:     I have personally reviewed and noted the following in the patient's chart:   Medical and social history Use of alcohol, tobacco or illicit drugs  Current medications and supplements including opioid prescriptions. Patient is not currently taking opioid prescriptions. Functional ability and status Nutritional status Physical activity Advanced directives List of other physicians Hospitalizations, surgeries, and ER visits in previous 12 months Vitals Screenings to include cognitive, depression, and falls Referrals and appointments  In addition, I have reviewed and discussed with patient certain preventive protocols, quality metrics, and best practice recommendations. A written personalized care plan  for preventive services as well as general preventive health recommendations were provided to patient.     Dionisio David, LPN   07/13/1495   Nurse Notes: none

## 2021-06-17 NOTE — Patient Instructions (Signed)
Jacob Montes , ?Thank you for taking time to come for your Medicare Wellness Visit. I appreciate your ongoing commitment to your health goals. Please review the following plan we discussed and let me know if I can assist you in the future.  ? ?Screening recommendations/referrals: ?Colonoscopy: aged out  ?Recommended yearly ophthalmology/optometry visit for glaucoma screening and checkup ?Recommended yearly dental visit for hygiene and checkup ? ?Vaccinations: ?Influenza vaccine: 11/16/20 ?Pneumococcal vaccine: 09/14/13 ?Tdap vaccine: 05/01/11, due ?Shingles vaccine: Zostavax 03/08/07   ?Covid-19: 02/15/19, 03/11/19 ? ?Advanced directives: no ? ?Conditions/risks identified: none ? ?Next appointment: Follow up in one year for your annual wellness visit. 06/22/22 @ 10:30am by phone ? ?Preventive Care 48 Years and Older, Male ?Preventive care refers to lifestyle choices and visits with your health care provider that can promote health and wellness. ?What does preventive care include? ?A yearly physical exam. This is also called an annual well check. ?Dental exams once or twice a year. ?Routine eye exams. Ask your health care provider how often you should have your eyes checked. ?Personal lifestyle choices, including: ?Daily care of your teeth and gums. ?Regular physical activity. ?Eating a healthy diet. ?Avoiding tobacco and drug use. ?Limiting alcohol use. ?Practicing safe sex. ?Taking low doses of aspirin every day. ?Taking vitamin and mineral supplements as recommended by your health care provider. ?What happens during an annual well check? ?The services and screenings done by your health care provider during your annual well check will depend on your age, overall health, lifestyle risk factors, and family history of disease. ?Counseling  ?Your health care provider may ask you questions about your: ?Alcohol use. ?Tobacco use. ?Drug use. ?Emotional well-being. ?Home and relationship well-being. ?Sexual activity. ?Eating  habits. ?History of falls. ?Memory and ability to understand (cognition). ?Work and work Statistician. ?Screening  ?You may have the following tests or measurements: ?Height, weight, and BMI. ?Blood pressure. ?Lipid and cholesterol levels. These may be checked every 5 years, or more frequently if you are over 10 years old. ?Skin check. ?Lung cancer screening. You may have this screening every year starting at age 58 if you have a 30-pack-year history of smoking and currently smoke or have quit within the past 15 years. ?Fecal occult blood test (FOBT) of the stool. You may have this test every year starting at age 32. ?Flexible sigmoidoscopy or colonoscopy. You may have a sigmoidoscopy every 5 years or a colonoscopy every 10 years starting at age 5. ?Prostate cancer screening. Recommendations will vary depending on your family history and other risks. ?Hepatitis C blood test. ?Hepatitis B blood test. ?Sexually transmitted disease (STD) testing. ?Diabetes screening. This is done by checking your blood sugar (glucose) after you have not eaten for a while (fasting). You may have this done every 1-3 years. ?Abdominal aortic aneurysm (AAA) screening. You may need this if you are a current or former smoker. ?Osteoporosis. You may be screened starting at age 56 if you are at high risk. ?Talk with your health care provider about your test results, treatment options, and if necessary, the need for more tests. ?Vaccines  ?Your health care provider may recommend certain vaccines, such as: ?Influenza vaccine. This is recommended every year. ?Tetanus, diphtheria, and acellular pertussis (Tdap, Td) vaccine. You may need a Td booster every 10 years. ?Zoster vaccine. You may need this after age 14. ?Pneumococcal 13-valent conjugate (PCV13) vaccine. One dose is recommended after age 64. ?Pneumococcal polysaccharide (PPSV23) vaccine. One dose is recommended after age 27. ?Talk to your  health care provider about which screenings and  vaccines you need and how often you need them. ?This information is not intended to replace advice given to you by your health care provider. Make sure you discuss any questions you have with your health care provider. ?Document Released: 02/22/2015 Document Revised: 10/16/2015 Document Reviewed: 11/27/2014 ?Elsevier Interactive Patient Education ? 2017 [place]. ? ?Fall Prevention in the Home ?Falls can cause injuries. They can happen to people of all ages. There are many things you can do to make your home safe and to help prevent falls. ?What can I do on the outside of my home? ?Regularly fix the edges of walkways and driveways and fix any cracks. ?Remove anything that might make you trip as you walk through a door, such as a raised step or threshold. ?Trim any bushes or trees on the path to your home. ?Use bright outdoor lighting. ?Clear any walking paths of anything that might make someone trip, such as rocks or tools. ?Regularly check to see if handrails are loose or broken. Make sure that both sides of any steps have handrails. ?Any raised decks and porches should have guardrails on the edges. ?Have any leaves, snow, or ice cleared regularly. ?Use sand or salt on walking paths during winter. ?Clean up any spills in your garage right away. This includes oil or grease spills. ?What can I do in the bathroom? ?Use night lights. ?Install grab bars by the toilet and in the tub and shower. Do not use towel bars as grab bars. ?Use non-skid mats or decals in the tub or shower. ?If you need to sit down in the shower, use a plastic, non-slip stool. ?Keep the floor dry. Clean up any water that spills on the floor as soon as it happens. ?Remove soap buildup in the tub or shower regularly. ?Attach bath mats securely with double-sided non-slip rug tape. ?Do not have throw rugs and other things on the floor that can make you trip. ?What can I do in the bedroom? ?Use night lights. ?Make sure that you have a light by your  bed that is easy to reach. ?Do not use any sheets or blankets that are too big for your bed. They should not hang down onto the floor. ?Have a firm chair that has side arms. You can use this for support while you get dressed. ?Do not have throw rugs and other things on the floor that can make you trip. ?What can I do in the kitchen? ?Clean up any spills right away. ?Avoid walking on wet floors. ?Keep items that you use a lot in easy-to-reach places. ?If you need to reach something above you, use a strong step stool that has a grab bar. ?Keep electrical cords out of the way. ?Do not use floor polish or wax that makes floors slippery. If you must use wax, use non-skid floor wax. ?Do not have throw rugs and other things on the floor that can make you trip. ?What can I do with my stairs? ?Do not leave any items on the stairs. ?Make sure that there are handrails on both sides of the stairs and use them. Fix handrails that are broken or loose. Make sure that handrails are as long as the stairways. ?Check any carpeting to make sure that it is firmly attached to the stairs. Fix any carpet that is loose or worn. ?Avoid having throw rugs at the top or bottom of the stairs. If you do have throw rugs, attach them  to the floor with carpet tape. ?Make sure that you have a light switch at the top of the stairs and the bottom of the stairs. If you do not have them, ask someone to add them for you. ?What else can I do to help prevent falls? ?Wear shoes that: ?Do not have high heels. ?Have rubber bottoms. ?Are comfortable and fit you well. ?Are closed at the toe. Do not wear sandals. ?If you use a stepladder: ?Make sure that it is fully opened. Do not climb a closed stepladder. ?Make sure that both sides of the stepladder are locked into place. ?Ask someone to hold it for you, if possible. ?Clearly mark and make sure that you can see: ?Any grab bars or handrails. ?First and last steps. ?Where the edge of each step is. ?Use tools that  help you move around (mobility aids) if they are needed. These include: ?Canes. ?Walkers. ?Scooters. ?Crutches. ?Turn on the lights when you go into a dark area. Replace any light bulbs as soon as they burn out

## 2021-07-02 ENCOUNTER — Encounter: Payer: Self-pay | Admitting: Family Medicine

## 2021-07-03 ENCOUNTER — Ambulatory Visit: Payer: Self-pay | Admitting: *Deleted

## 2021-07-03 ENCOUNTER — Encounter: Payer: Self-pay | Admitting: Family Medicine

## 2021-07-03 ENCOUNTER — Ambulatory Visit
Admission: RE | Admit: 2021-07-03 | Discharge: 2021-07-03 | Disposition: A | Discharge status: Home / Self Care | Payer: Medicare Other | Attending: Family Medicine | Admitting: Family Medicine

## 2021-07-03 ENCOUNTER — Ambulatory Visit (INDEPENDENT_AMBULATORY_CARE_PROVIDER_SITE_OTHER): Payer: Medicare Other | Admitting: Family Medicine

## 2021-07-03 ENCOUNTER — Ambulatory Visit
Admission: RE | Admit: 2021-07-03 | Discharge: 2021-07-03 | Disposition: A | Discharge status: Home / Self Care | Payer: Medicare Other | Source: Ambulatory Visit | Attending: Family Medicine | Admitting: Family Medicine

## 2021-07-03 VITALS — BP 120/43 | HR 59 | Temp 97.8°F | Resp 16 | Wt 237.0 lb

## 2021-07-03 DIAGNOSIS — R140 Abdominal distension (gaseous): Secondary | ICD-10-CM | POA: Insufficient documentation

## 2021-07-03 DIAGNOSIS — K5900 Constipation, unspecified: Secondary | ICD-10-CM | POA: Insufficient documentation

## 2021-07-03 DIAGNOSIS — K8309 Other cholangitis: Secondary | ICD-10-CM

## 2021-07-03 DIAGNOSIS — R1084 Generalized abdominal pain: Secondary | ICD-10-CM | POA: Diagnosis not present

## 2021-07-03 DIAGNOSIS — K5792 Diverticulitis of intestine, part unspecified, without perforation or abscess without bleeding: Secondary | ICD-10-CM

## 2021-07-03 DIAGNOSIS — R109 Unspecified abdominal pain: Secondary | ICD-10-CM | POA: Diagnosis not present

## 2021-07-03 MED ORDER — DOXYCYCLINE HYCLATE 100 MG PO TABS: 100.0000 mg | ORAL_TABLET | Freq: Two times a day (BID) | ORAL | 0 refills | Status: DC

## 2021-07-03 NOTE — Progress Notes (Signed)
Established patient visit  I,[name],acting as a scribe for [name], MD.,have documented all relevant documentation on the behalf of [name], MD,as directed by  [name], MD while in the presence of [name], MD.   Patient: Jacob Montes   DOB: 1940/10/07   81 y.o. Male  MRN: [id number] Visit Date: 07/03/2021  Today's healthcare provider: [name], MD   Chief Complaint  Patient presents with   Abdominal Pain   Constipation   Subjective    HPI  Patient has had abdominal pain and constipation for 2 days. Abdominal pain is located in upper/center of abdomen. Patient also has symptoms of fever, abdominal bloating, and gas. Patient has taken Colace and Gas-x with slight relief. He did have 1 bowel movement after taking Cloacae.  Patient's Tmax was 101.6 yesterday.  101.3 this morning.  He has felt bloated and constipated during this time and it does not feel like the diverticulitis that he has had over the past couple of years.  He is status post cholecystectomy and MRCP for retained stone.  He is feeling a little bit better now than he was earlier.  Medications: Outpatient Medications Prior to Visit  Medication Sig   amLODipine (NORVASC) 10 MG tablet Take 1 tablet by mouth once daily   aspirin 81 MG chewable tablet Chew 81 mg by mouth daily.   docusate sodium (COLACE) 250 MG capsule Take 250 mg by mouth daily as needed for constipation.    furosemide (LASIX) 40 MG tablet Take 1 tablet (40 mg total) by mouth 3 (three) times a week.   metoprolol tartrate (LOPRESSOR) 25 MG tablet Take 1 tablet (25 mg total) by mouth 2 (two) times daily.   montelukast (SINGULAIR) 10 MG tablet Take 1 tablet (10 mg total) by mouth at bedtime as needed (allergies).   Multiple Vitamin (MULTIVITAMIN) tablet Take 1 tablet by mouth daily.   omeprazole (PRILOSEC) 20 MG capsule Take 20 mg by mouth daily. As needed   potassium chloride (KLOR-CON) 10 MEQ  tablet Take 1 tablet (10 mEq total) by mouth 3 (three) times a week. Take with lasix   rosuvastatin (CRESTOR) 20 MG tablet Take 1 tablet by mouth once daily   tamsulosin (FLOMAX) 0.4 MG CAPS capsule Take 1 capsule by mouth once daily   Vitamins/Minerals TABS Take by mouth.    [DISCONTINUED] doxycycline (VIBRA-TABS) 100 MG tablet Take 1 tablet by mouth twice daily for 5 days (Patient not taking: Reported on 06/17/2021)   No facility-administered medications prior to visit.    Review of Systems  Constitutional:  Negative for appetite change, chills and fever.  Respiratory:  Negative for chest tightness, shortness of breath and wheezing.   Cardiovascular:  Negative for chest pain and palpitations.  Gastrointestinal:  Positive for abdominal pain and constipation. Negative for nausea and vomiting.   Last metabolic panel Lab Results  Component Value Date   GLUCOSE 95 07/03/2021   NA 140 07/03/2021   K 4.7 07/03/2021   CL 102 07/03/2021   CO2 23 07/03/2021   BUN 17 07/03/2021   CREATININE 0.89 07/03/2021   EGFR 87 07/03/2021   CALCIUM 9.2 07/03/2021   PROT 6.4 07/03/2021   ALBUMIN 4.2 07/03/2021   LABGLOB 2.2 07/03/2021   AGRATIO 1.9 07/03/2021   BILITOT 5.6 (H) 07/03/2021   ALKPHOS 202 (H) 07/03/2021   AST 302 (H) 07/03/2021   ALT 382 (H) 07/03/2021   ANIONGAP 7 11/21/2018  Objective    BP (!) 120/43 (BP Location: Left Arm, Patient Position: Sitting, Cuff Size: Large)   Pulse (!) 59   Temp 97.8 F (36.6 C) (Temporal)   Resp 16   Wt 237 lb (107.5 kg)   SpO2 97%   BMI 38.25 kg/m  BP Readings from Last 3 Encounters:  07/03/21 (!) 120/43  04/29/21 (!) 155/67  04/22/21 (!) 130/38   Wt Readings from Last 3 Encounters:  07/03/21 237 lb (107.5 kg)  06/17/21 240 lb (108.9 kg)  04/29/21 240 lb (108.9 kg)      Physical Exam Vitals reviewed.  Constitutional:      Appearance: Normal appearance.  HENT:     Head: Normocephalic and atraumatic.     Right Ear: External  ear normal.     Left Ear: External ear normal.  Eyes:     General: No scleral icterus.    Conjunctiva/sclera: Conjunctivae normal.  Cardiovascular:     Rate and Rhythm: Normal rate and regular rhythm.     Pulses: Normal pulses.     Heart sounds: Normal heart sounds.     Comments: No carotid bruit Pulmonary:     Effort: Pulmonary effort is normal.     Breath sounds: Normal breath sounds.  Abdominal:     General: Bowel sounds are normal. There is no distension.     Palpations: Abdomen is soft.     Tenderness: There is no abdominal tenderness.  Musculoskeletal:     Right lower leg: No edema.     Left lower leg: No edema.  Skin:    General: Skin is warm and dry.  Neurological:     General: No focal deficit present.     Mental Status: He is alert and oriented to person, place, and time.  Psychiatric:        Mood and Affect: Mood normal.        Behavior: Behavior normal.        Thought Content: Thought content normal.        Judgment: Judgment normal.      No results found for any visits on 07/03/21.  Assessment & Plan     1. Generalized abdominal pain Check KUB today to rule out possible SBO as a source of his bloating and discomfort. More than 50% 25 minute visit spent in counseling or coordination of care  - DG Abd 1 View - CBC w/Diff/Platelet - Comprehensive Metabolic Panel (CMET) - doxycycline (VIBRA-TABS) 100 MG tablet; Take 1 tablet (100 mg total) by mouth 2 (two) times daily. for 5 days  Dispense: 10 tablet; Refill: 0  2. Constipation, unspecified constipation type  - DG Abd 1 View - CBC w/Diff/Platelet - Comprehensive Metabolic Panel (CMET)  3. Diverticulitis Cover for diverticulitis as he has had several episodes but I do not think that is what is going on today with no focal tenderness. - doxycycline (VIBRA-TABS) 100 MG tablet; Take 1 tablet (100 mg total) by mouth 2 (two) times daily. for 5 days  Dispense: 10 tablet; Refill: 0    Return in about 13  weeks (around 10/02/2021).      I, [name], MD, have reviewed all documentation for this visit. The documentation on 07/04/21 for the exam, diagnosis, procedures, and orders are all accurate and complete.    Geneveive Furness Cranford Mon, MD  [facility] [phone] (phone) [phone] (fax)  [place]

## 2021-07-03 NOTE — Telephone Encounter (Signed)
Reason for Disposition  [1] MILD-MODERATE pain AND [2] constant AND [3] present > 2 hours  Answer Assessment - Initial Assessment Questions 1. LOCATION: "Where does it hurt?"      Wife calling in.    Started Monday night abd pain.   He ate a sandwich at lunch that did not agree with him.   Gave him Gas-X it helped.    Tues. Tight and hard to get it out and hard.  No BM since Tues.     He is having fevers.   100.7 yesterday.  Then 101.3 this morning.   He hasn't had a BM since Tues. He woke up this morning at 5:00 am with bad abd pain.  Gave Tylenol and fever is down to 99.4 now.   This is a different pain than low area with his diverticulitis.   Dr. Rosanna Randy has seen him several times for the diverticulitis.     2. RADIATION: "Does the pain shoot anywhere else?" (e.g., chest, back)     No 3. ONSET: "When did the pain begin?" (Minutes, hours or days ago)      Monday night.     It's intermittent pain.     4. SUDDEN: "Gradual or sudden onset?"     It varies in intensity.     5. PATTERN "Does the pain come and go, or is it constant?"    - If constant: "Is it getting better, staying the same, or worsening?"      (Note: Constant means the pain never goes away completely; most serious pain is constant and it progresses)     - If intermittent: "How long does it last?" "Do you have pain now?"     (Note: Intermittent means the pain goes away completely between bouts)     Intermittent 6. SEVERITY: "How bad is the pain?"  (e.g., Scale 1-10; mild, moderate, or severe)    - MILD (1-3): doesn't interfere with normal activities, abdomen soft and not tender to touch     - MODERATE (4-7): interferes with normal activities or awakens from sleep, abdomen tender to touch     - SEVERE (8-10): excruciating pain, doubled over, unable to do any normal activities       On and off severe.   The wrap I ate Monday night causes abd pain and a lot of gas. 7. RECURRENT SYMPTOM: "Have you ever had this type of stomach  pain before?" If Yes, ask: "When was the last time?" and "What happened that time?"      No 8. CAUSE: "What do you think is causing the stomach pain?"     The wrap I ate on Monday night.   The pain started after that.   9. RELIEVING/AGGRAVATING FACTORS: "What makes it better or worse?" (e.g., movement, antacids, bowel movement)     Gas-X helped 10. OTHER SYMPTOMS: "Do you have any other symptoms?" (e.g., back pain, diarrhea, fever, urination pain, vomiting)       No  Protocols used: Abdominal Pain - Male-A-AH

## 2021-07-03 NOTE — Telephone Encounter (Signed)
  Chief Complaint: Abd pain and constipation, and fever Symptoms: intermittent abd pain.  No BM since Tues and it was hard and difficult to pass Frequency: Intermittent bad upper abd pain Pertinent Negatives: Patient denies Having constipation problems before.  Has diverticulitis.   Disposition: '[]'$ ED /'[]'$ Urgent Care (no appt availability in office) / '[x]'$ Appointment(In office/virtual)/ '[]'$  [facility]/ '[]'$ Home Care/ '[]'$ Refused Recommended Disposition /'[]'$ [facility]/ '[]'$  Follow-up with PCP Additional Notes: Appt made for today with Dr. Rosanna Randy at 11:00.

## 2021-07-04 ENCOUNTER — Other Ambulatory Visit: Payer: Self-pay | Admitting: *Deleted

## 2021-07-04 DIAGNOSIS — K8309 Other cholangitis: Secondary | ICD-10-CM | POA: Diagnosis not present

## 2021-07-04 LAB — CBC WITH DIFFERENTIAL/PLATELET
Basophils Absolute: 0 10*3/uL (ref 0.0–0.2)
Basos: 0 %
EOS (ABSOLUTE): 0 10*3/uL (ref 0.0–0.4)
Eos: 0 %
Hematocrit: 41.4 % (ref 37.5–51.0)
Hemoglobin: 14.8 g/dL (ref 13.0–17.7)
Immature Grans (Abs): 0 10*3/uL (ref 0.0–0.1)
Immature Granulocytes: 0 %
Lymphocytes Absolute: 1.6 10*3/uL (ref 0.7–3.1)
Lymphs: 20 %
MCH: 33.6 pg — ABNORMAL HIGH (ref 26.6–33.0)
MCHC: 35.7 g/dL (ref 31.5–35.7)
MCV: 94 fL (ref 79–97)
Monocytes Absolute: 1.1 10*3/uL — ABNORMAL HIGH (ref 0.1–0.9)
Monocytes: 13 %
Neutrophils Absolute: 5.3 10*3/uL (ref 1.4–7.0)
Neutrophils: 67 %
Platelets: 115 10*3/uL — ABNORMAL LOW (ref 150–450)
RBC: 4.4 x10E6/uL (ref 4.14–5.80)
RDW: 13 % (ref 11.6–15.4)
WBC: 8 10*3/uL (ref 3.4–10.8)

## 2021-07-04 LAB — COMPREHENSIVE METABOLIC PANEL
ALT: 382 IU/L — ABNORMAL HIGH (ref 0–44)
AST: 302 IU/L — ABNORMAL HIGH (ref 0–40)
Albumin/Globulin Ratio: 1.9 (ref 1.2–2.2)
Albumin: 4.2 g/dL (ref 3.7–4.7)
Alkaline Phosphatase: 202 IU/L — ABNORMAL HIGH (ref 44–121)
BUN/Creatinine Ratio: 19 (ref 10–24)
BUN: 17 mg/dL (ref 8–27)
Bilirubin Total: 5.6 mg/dL — ABNORMAL HIGH (ref 0.0–1.2)
CO2: 23 mmol/L (ref 20–29)
Calcium: 9.2 mg/dL (ref 8.6–10.2)
Chloride: 102 mmol/L (ref 96–106)
Creatinine, Ser: 0.89 mg/dL (ref 0.76–1.27)
Globulin, Total: 2.2 g/dL (ref 1.5–4.5)
Glucose: 95 mg/dL (ref 70–99)
Potassium: 4.7 mmol/L (ref 3.5–5.2)
Sodium: 140 mmol/L (ref 134–144)
Total Protein: 6.4 g/dL (ref 6.0–8.5)
eGFR: 87 mL/min/{1.73_m2} (ref >59)

## 2021-07-05 ENCOUNTER — Ambulatory Visit (HOSPITAL_BASED_OUTPATIENT_CLINIC_OR_DEPARTMENT_OTHER)
Admission: RE | Admit: 2021-07-05 | Discharge: 2021-07-05 | Disposition: A | Discharge status: Home / Self Care | Payer: Medicare Other | Source: Ambulatory Visit | Attending: Family Medicine | Admitting: Family Medicine

## 2021-07-05 DIAGNOSIS — Z9049 Acquired absence of other specified parts of digestive tract: Secondary | ICD-10-CM | POA: Diagnosis not present

## 2021-07-05 DIAGNOSIS — R109 Unspecified abdominal pain: Secondary | ICD-10-CM | POA: Diagnosis not present

## 2021-07-05 DIAGNOSIS — K8309 Other cholangitis: Secondary | ICD-10-CM | POA: Insufficient documentation

## 2021-07-05 DIAGNOSIS — R140 Abdominal distension (gaseous): Secondary | ICD-10-CM | POA: Diagnosis not present

## 2021-07-05 LAB — COMPREHENSIVE METABOLIC PANEL
ALT: 251 IU/L — ABNORMAL HIGH (ref 0–44)
AST: 148 IU/L — ABNORMAL HIGH (ref 0–40)
Albumin/Globulin Ratio: 1.8 (ref 1.2–2.2)
Albumin: 4.2 g/dL (ref 3.7–4.7)
Alkaline Phosphatase: 218 IU/L — ABNORMAL HIGH (ref 44–121)
BUN/Creatinine Ratio: 18 (ref 10–24)
BUN: 14 mg/dL (ref 8–27)
Bilirubin Total: 2.2 mg/dL — ABNORMAL HIGH (ref 0.0–1.2)
CO2: 22 mmol/L (ref 20–29)
Calcium: 9.2 mg/dL (ref 8.6–10.2)
Chloride: 102 mmol/L (ref 96–106)
Creatinine, Ser: 0.8 mg/dL (ref 0.76–1.27)
Globulin, Total: 2.3 g/dL (ref 1.5–4.5)
Glucose: 107 mg/dL — ABNORMAL HIGH (ref 70–99)
Potassium: 4.4 mmol/L (ref 3.5–5.2)
Sodium: 138 mmol/L (ref 134–144)
Total Protein: 6.5 g/dL (ref 6.0–8.5)
eGFR: 89 mL/min/{1.73_m2} (ref >59)

## 2021-07-05 LAB — CBC WITH DIFFERENTIAL/PLATELET
Basophils Absolute: 0 10*3/uL (ref 0.0–0.2)
Basos: 1 %
EOS (ABSOLUTE): 0.1 10*3/uL (ref 0.0–0.4)
Eos: 1 %
Hematocrit: 40 % (ref 37.5–51.0)
Hemoglobin: 14 g/dL (ref 13.0–17.7)
Immature Grans (Abs): 0 10*3/uL (ref 0.0–0.1)
Immature Granulocytes: 0 %
Lymphocytes Absolute: 2.2 10*3/uL (ref 0.7–3.1)
Lymphs: 33 %
MCH: 33.2 pg — ABNORMAL HIGH (ref 26.6–33.0)
MCHC: 35 g/dL (ref 31.5–35.7)
MCV: 95 fL (ref 79–97)
Monocytes Absolute: 1.1 10*3/uL — ABNORMAL HIGH (ref 0.1–0.9)
Monocytes: 17 %
Neutrophils Absolute: 3.2 10*3/uL (ref 1.4–7.0)
Neutrophils: 48 %
Platelets: 120 10*3/uL — ABNORMAL LOW (ref 150–450)
RBC: 4.22 x10E6/uL (ref 4.14–5.80)
RDW: 13.4 % (ref 11.6–15.4)
WBC: 6.6 10*3/uL (ref 3.4–10.8)

## 2021-07-08 ENCOUNTER — Ambulatory Visit: Payer: Medicare Other

## 2021-07-08 ENCOUNTER — Emergency Department: Payer: Medicare Other

## 2021-07-08 ENCOUNTER — Inpatient Hospital Stay
Admission: EM | Admit: 2021-07-08 | Discharge: 2021-07-12 | DRG: 445 | Disposition: A | Discharge status: Home / Self Care | Payer: Medicare Other | Attending: Student | Admitting: Student

## 2021-07-08 ENCOUNTER — Other Ambulatory Visit: Payer: Self-pay

## 2021-07-08 DIAGNOSIS — I350 Nonrheumatic aortic (valve) stenosis: Secondary | ICD-10-CM | POA: Diagnosis not present

## 2021-07-08 DIAGNOSIS — Z823 Family history of stroke: Secondary | ICD-10-CM

## 2021-07-08 DIAGNOSIS — Z952 Presence of prosthetic heart valve: Secondary | ICD-10-CM | POA: Diagnosis not present

## 2021-07-08 DIAGNOSIS — B179 Acute viral hepatitis, unspecified: Secondary | ICD-10-CM | POA: Diagnosis not present

## 2021-07-08 DIAGNOSIS — R509 Fever, unspecified: Secondary | ICD-10-CM | POA: Diagnosis not present

## 2021-07-08 DIAGNOSIS — K8050 Calculus of bile duct without cholangitis or cholecystitis without obstruction: Secondary | ICD-10-CM

## 2021-07-08 DIAGNOSIS — R748 Abnormal levels of other serum enzymes: Secondary | ICD-10-CM | POA: Diagnosis not present

## 2021-07-08 DIAGNOSIS — I2510 Atherosclerotic heart disease of native coronary artery without angina pectoris: Secondary | ICD-10-CM | POA: Diagnosis present

## 2021-07-08 DIAGNOSIS — D7389 Other diseases of spleen: Secondary | ICD-10-CM | POA: Diagnosis not present

## 2021-07-08 DIAGNOSIS — N400 Enlarged prostate without lower urinary tract symptoms: Secondary | ICD-10-CM | POA: Diagnosis present

## 2021-07-08 DIAGNOSIS — I10 Essential (primary) hypertension: Secondary | ICD-10-CM | POA: Diagnosis not present

## 2021-07-08 DIAGNOSIS — Z86718 Personal history of other venous thrombosis and embolism: Secondary | ICD-10-CM

## 2021-07-08 DIAGNOSIS — K8309 Other cholangitis: Secondary | ICD-10-CM | POA: Diagnosis present

## 2021-07-08 DIAGNOSIS — K8689 Other specified diseases of pancreas: Secondary | ICD-10-CM | POA: Diagnosis not present

## 2021-07-08 DIAGNOSIS — R5081 Fever presenting with conditions classified elsewhere: Secondary | ICD-10-CM

## 2021-07-08 DIAGNOSIS — Z888 Allergy status to other drugs, medicaments and biological substances status: Secondary | ICD-10-CM | POA: Diagnosis not present

## 2021-07-08 DIAGNOSIS — Z8673 Personal history of transient ischemic attack (TIA), and cerebral infarction without residual deficits: Secondary | ICD-10-CM | POA: Diagnosis not present

## 2021-07-08 DIAGNOSIS — E785 Hyperlipidemia, unspecified: Secondary | ICD-10-CM | POA: Diagnosis present

## 2021-07-08 DIAGNOSIS — R7401 Elevation of levels of liver transaminase levels: Secondary | ICD-10-CM | POA: Diagnosis present

## 2021-07-08 DIAGNOSIS — Z6837 Body mass index (BMI) 37.0-37.9, adult: Secondary | ICD-10-CM | POA: Diagnosis not present

## 2021-07-08 DIAGNOSIS — Z86711 Personal history of pulmonary embolism: Secondary | ICD-10-CM

## 2021-07-08 DIAGNOSIS — Z8249 Family history of ischemic heart disease and other diseases of the circulatory system: Secondary | ICD-10-CM | POA: Diagnosis not present

## 2021-07-08 DIAGNOSIS — K7689 Other specified diseases of liver: Secondary | ICD-10-CM | POA: Diagnosis not present

## 2021-07-08 DIAGNOSIS — Z7982 Long term (current) use of aspirin: Secondary | ICD-10-CM

## 2021-07-08 DIAGNOSIS — Z87891 Personal history of nicotine dependence: Secondary | ICD-10-CM | POA: Diagnosis not present

## 2021-07-08 DIAGNOSIS — G4730 Sleep apnea, unspecified: Secondary | ICD-10-CM | POA: Diagnosis present

## 2021-07-08 DIAGNOSIS — Z79899 Other long term (current) drug therapy: Secondary | ICD-10-CM

## 2021-07-08 DIAGNOSIS — K8030 Calculus of bile duct with cholangitis, unspecified, without obstruction: Secondary | ICD-10-CM | POA: Diagnosis not present

## 2021-07-08 DIAGNOSIS — R1084 Generalized abdominal pain: Secondary | ICD-10-CM

## 2021-07-08 DIAGNOSIS — Z951 Presence of aortocoronary bypass graft: Secondary | ICD-10-CM

## 2021-07-08 DIAGNOSIS — Z85828 Personal history of other malignant neoplasm of skin: Secondary | ICD-10-CM | POA: Diagnosis not present

## 2021-07-08 DIAGNOSIS — K219 Gastro-esophageal reflux disease without esophagitis: Secondary | ICD-10-CM | POA: Diagnosis not present

## 2021-07-08 LAB — URINALYSIS, ROUTINE W REFLEX MICROSCOPIC
Bilirubin Urine: NEGATIVE
Glucose, UA: 50 mg/dL — AB
Hgb urine dipstick: NEGATIVE
Ketones, ur: NEGATIVE mg/dL
Leukocytes,Ua: NEGATIVE
Nitrite: NEGATIVE
Protein, ur: NEGATIVE mg/dL
Specific Gravity, Urine: 1.031 — ABNORMAL HIGH (ref 1.005–1.030)
pH: 5 (ref 5.0–8.0)

## 2021-07-08 LAB — COMPREHENSIVE METABOLIC PANEL
ALT: 80 U/L — ABNORMAL HIGH (ref 0–44)
AST: 38 U/L (ref 15–41)
Albumin: 3.5 g/dL (ref 3.5–5.0)
Alkaline Phosphatase: 125 U/L (ref 38–126)
Anion gap: 8 (ref 5–15)
BUN: 19 mg/dL (ref 8–23)
CO2: 25 mmol/L (ref 22–32)
Calcium: 9.5 mg/dL (ref 8.9–10.3)
Chloride: 103 mmol/L (ref 98–111)
Creatinine, Ser: 0.68 mg/dL (ref 0.61–1.24)
GFR, Estimated: >60 mL/min (ref >60)
Glucose, Bld: 215 mg/dL — ABNORMAL HIGH (ref 70–99)
Potassium: 3.8 mmol/L (ref 3.5–5.1)
Sodium: 136 mmol/L (ref 135–145)
Total Bilirubin: 1.3 mg/dL — ABNORMAL HIGH (ref 0.3–1.2)
Total Protein: 6.9 g/dL (ref 6.5–8.1)

## 2021-07-08 LAB — LACTIC ACID, PLASMA
Lactic Acid, Venous: <0.3 mmol/L — ABNORMAL LOW (ref 0.5–1.9)
Lactic Acid, Venous: 1.5 mmol/L (ref 0.5–1.9)

## 2021-07-08 LAB — CBC
HCT: 39.4 % (ref 39.0–52.0)
Hemoglobin: 13.4 g/dL (ref 13.0–17.0)
MCH: 32.4 pg (ref 26.0–34.0)
MCHC: 34 g/dL (ref 30.0–36.0)
MCV: 95.2 fL (ref 80.0–100.0)
Platelets: 151 10*3/uL (ref 150–400)
RBC: 4.14 MIL/uL — ABNORMAL LOW (ref 4.22–5.81)
RDW: 13.4 % (ref 11.5–15.5)
WBC: 11 10*3/uL — ABNORMAL HIGH (ref 4.0–10.5)
nRBC: 0 % (ref 0.0–0.2)

## 2021-07-08 LAB — LIPASE, BLOOD: Lipase: 17 U/L (ref 11–51)

## 2021-07-08 MED ORDER — ADULT MULTIVITAMIN W/MINERALS CH
1.0000 | ORAL_TABLET | Freq: Every day | ORAL | Status: DC
  Administered 2021-07-09 – 2021-07-12 (×4): 1 via ORAL
  Filled 2021-07-08 (×4): qty 1

## 2021-07-08 MED ORDER — ONDANSETRON HCL 4 MG/2ML IJ SOLN: 4.0000 mg | Freq: Four times a day (QID) | INTRAMUSCULAR | Status: DC | PRN

## 2021-07-08 MED ORDER — PIPERACILLIN-TAZOBACTAM 3.375 G IVPB 30 MIN: 3.3750 g | Freq: Four times a day (QID) | INTRAVENOUS | Status: DC

## 2021-07-08 MED ORDER — GADOBUTROL 1 MMOL/ML IV SOLN
10.0000 mL | Freq: Once | INTRAVENOUS | Status: AC | PRN
  Administered 2021-07-08: 10 mL via INTRAVENOUS

## 2021-07-08 MED ORDER — AMLODIPINE BESYLATE 10 MG PO TABS
10.0000 mg | ORAL_TABLET | Freq: Every day | ORAL | Status: DC
  Administered 2021-07-09 – 2021-07-12 (×4): 10 mg via ORAL
  Filled 2021-07-08 (×4): qty 1

## 2021-07-08 MED ORDER — TAMSULOSIN HCL 0.4 MG PO CAPS
0.4000 mg | ORAL_CAPSULE | Freq: Every day | ORAL | Status: DC
  Administered 2021-07-09 – 2021-07-12 (×4): 0.4 mg via ORAL
  Filled 2021-07-08 (×4): qty 1

## 2021-07-08 MED ORDER — PIPERACILLIN-TAZOBACTAM 3.375 G IVPB 30 MIN
3.3750 g | Freq: Once | INTRAVENOUS | Status: AC
  Administered 2021-07-08: 3.375 g via INTRAVENOUS
  Filled 2021-07-08: qty 50

## 2021-07-08 MED ORDER — PIPERACILLIN-TAZOBACTAM 3.375 G IVPB
3.3750 g | Freq: Three times a day (TID) | INTRAVENOUS | Status: DC
  Administered 2021-07-08 – 2021-07-12 (×11): 3.375 g via INTRAVENOUS
  Filled 2021-07-08 (×11): qty 50

## 2021-07-08 MED ORDER — MONTELUKAST SODIUM 10 MG PO TABS: 10.0000 mg | ORAL_TABLET | Freq: Every evening | ORAL | Status: DC | PRN

## 2021-07-08 MED ORDER — ONDANSETRON HCL 4 MG PO TABS: 4.0000 mg | ORAL_TABLET | Freq: Four times a day (QID) | ORAL | Status: DC | PRN

## 2021-07-08 MED ORDER — METOPROLOL TARTRATE 25 MG PO TABS
25.0000 mg | ORAL_TABLET | Freq: Two times a day (BID) | ORAL | Status: DC
  Administered 2021-07-08 – 2021-07-12 (×8): 25 mg via ORAL
  Filled 2021-07-08 (×8): qty 1

## 2021-07-08 MED ORDER — ROSUVASTATIN CALCIUM 10 MG PO TABS
20.0000 mg | ORAL_TABLET | Freq: Every day | ORAL | Status: DC
  Administered 2021-07-09 – 2021-07-11 (×3): 20 mg via ORAL
  Filled 2021-07-08 (×3): qty 2

## 2021-07-08 MED ORDER — PANTOPRAZOLE SODIUM 40 MG PO TBEC
40.0000 mg | DELAYED_RELEASE_TABLET | Freq: Every day | ORAL | Status: DC
  Administered 2021-07-09 – 2021-07-12 (×4): 40 mg via ORAL
  Filled 2021-07-08 (×4): qty 1

## 2021-07-08 MED ORDER — ENOXAPARIN SODIUM 60 MG/0.6ML IJ SOSY
0.5000 mg/kg | PREFILLED_SYRINGE | INTRAMUSCULAR | Status: DC
  Administered 2021-07-08 – 2021-07-11 (×4): 52.5 mg via SUBCUTANEOUS
  Filled 2021-07-08 (×4): qty 0.6

## 2021-07-08 MED ORDER — SODIUM CHLORIDE 0.9 % IV SOLN: INTRAVENOUS | Status: DC

## 2021-07-08 NOTE — ED Triage Notes (Signed)
Pt c/o fever since last Tuesday, he had upper abd pain with elevated bilirubin and had a negative [place] Saturday, was Rx Abx and the abd pain has gone away but remains to have the fever and finished the abx yesterday.

## 2021-07-08 NOTE — ED Provider Notes (Signed)
[facility] Provider Note    Event Date/Time   First MD Initiated Contact with Patient 07/08/21 1030     (approximate)   History   Chief Complaint: Abdominal Pain and Fever   HPI  Jacob Montes is a 81 y.o. male with a history of hypertension, CKD, obesity, choledocho lithiasis who comes ED complaining of fever for the past week.  She reports initially he was having upper abdominal pain and fever about a week ago.  Saw primary care, LFTs were elevated with transaminases of about 300 and T. bili of 5, outpatient upper abdominal ultrasound was unremarkable, status postcholecystectomy, no CBD dilation.  He was started on doxycycline.  He has now finished the doxycycline, but reports that he is still having fever of 102 at home.  Last took Tylenol at 3:30 AM today.  Reports that abdominal pain have resolved.     Physical Exam   Triage Vital Signs: ED Triage Vitals  Enc Vitals Group     BP 07/08/21 0910 (!) 143/46     Pulse Rate 07/08/21 0910 68     Resp 07/08/21 0910 17     Temp 07/08/21 0911 98.5 F (36.9 C)     Temp Source 07/08/21 0910 Oral     SpO2 07/08/21 0910 93 %     Weight 07/08/21 0910 231 lb (104.8 kg)     Height 07/08/21 0910 '5\' 6"'$  (1.676 m)     Head Circumference --      Peak Flow --      Pain Score 07/08/21 0910 0     Pain Loc --      Pain Edu? --      Excl. in [place]? --     Most recent vital signs: Vitals:   07/08/21 1230 07/08/21 1300  BP: (!) 137/43 (!) 142/46  Pulse: 65 62  Resp:  18  Temp:    SpO2: 99% 96%    General: Awake, no distress.  CV:  Good peripheral perfusion.  Regular rate and rhythm Resp:  Normal effort.  Clear to auscultation bilaterally Abd:  No distention.  Soft and nontender Other:  No lower extremity edema, no rash, no wounds   ED Results / Procedures / Treatments   Labs (all labs ordered are listed, but only abnormal results are displayed) Labs Reviewed  COMPREHENSIVE METABOLIC PANEL - Abnormal;  Notable for the following components:      Result Value   Glucose, Bld 215 (*)    ALT 80 (*)    Total Bilirubin 1.3 (*)    All other components within normal limits  CBC - Abnormal; Notable for the following components:   WBC 11.0 (*)    RBC 4.14 (*)    All other components within normal limits  URINALYSIS, ROUTINE W REFLEX MICROSCOPIC - Abnormal; Notable for the following components:   Color, Urine AMBER (*)    APPearance HAZY (*)    Specific Gravity, Urine 1.031 (*)    Glucose, UA 50 (*)    All other components within normal limits  LACTIC ACID, PLASMA - Abnormal; Notable for the following components:   Lactic Acid, Venous <0.3 (*)    All other components within normal limits  LIPASE, BLOOD  LACTIC ACID, PLASMA     EKG    RADIOLOGY Outpatient ultrasound right upper quadrant from 07/05/2021 reviewed and interpreted by me, overall unremarkable, no CBD dilation.  Radiology report reviewed.  MRCP radiology report reviewed, showing evidence of choledocholithiasis/sludge  causing some persistent CBD obstruction, consistent with cholangitis.  Also some evidence of developing liver abscesses.  Also possible perigastric mass such as GIST versus splenule   PROCEDURES:  Procedures   MEDICATIONS ORDERED IN ED: Medications  piperacillin-tazobactam (ZOSYN) IVPB 3.375 g (3.375 g Intravenous New Bag/Given 07/08/21 1449)  gadobutrol (GADAVIST) 1 MMOL/ML injection 10 mL (10 mLs Intravenous Contrast Given 07/08/21 1338)     IMPRESSION / MDM / ASSESSMENT AND PLAN / ED COURSE  I reviewed the triage vital signs and the nursing notes.                              Differential diagnosis includes, but is not limited to, cholangitis, pancreatic mass, liver abscess, viral illness  Patient's presentation is most consistent with acute presentation with potential threat to life or bodily function.     Clinical Course as of 07/08/21 1517  Tue Jul 08, 2021  1046 Patient presents with  persistent fever despite a week of doxycycline.  I reviewed outside records from primary care which shows that he had markedly elevated LFTs a week ago which have now normalized.  Outpatient ultrasound and x-ray of the abdomen unremarkable.  Discussed his presentation with gastroenterology who recommends MRCP to evaluate for retained CBD stone.  If this is unremarkable, think that his symptoms and lab trajectory is consistent with a passed CBD stone and that fever can be expected to resolve. [PS]  7846 MRCP does show signs of CBD stones/sludge as well as possible developing liver abscess and possible peri-gastric mass.  Will need to admit for ERCP; Dr. Allen Norris (GI) can plan for ERCP on Thursday. [PS]    Clinical Course User Index [PS] Carrie Mew, MD     FINAL CLINICAL IMPRESSION(S) / ED DIAGNOSES   Final diagnoses:  Cholangitis     Rx / DC Orders   ED Discharge Orders     None        Note:  This document was prepared using Dragon voice recognition software and may include unintentional dictation errors.   Carrie Mew, MD 07/08/21 [phone]

## 2021-07-08 NOTE — ED Notes (Signed)
RN hung pt abx. IV blew and RN removed IV. RN attempted to get other IV but was not successful with two attempts.

## 2021-07-08 NOTE — Consult Note (Signed)
Pharmacy Antibiotic Note  Jacob Montes is a 81 y.o. male admitted on 07/08/2021 with cholangitis.  Pharmacy has been consulted for Zosyn dosing.  Plan: Zosyn 3.375g IV q8h (4 hour infusion). Monitor clinical picture and renal function F/U C&S, abx deescalation / LOT   Height: '5\' 6"'$  (167.6 cm) Weight: 104.8 kg (231 lb) IBW/kg (Calculated) : 63.8  Temp (24hrs), Avg:98.5 F (36.9 C), Min:98.5 F (36.9 C), Max:98.5 F (36.9 C)  Recent Labs  Lab 07/03/21 1136 07/04/21 1605 07/08/21 0914  WBC 8.0 6.6 11.0*  CREATININE 0.89 0.80 0.68  LATICACIDVEN  --   --  <0.3*    Estimated Creatinine Clearance: 83.5 mL/min (by C-G formula based on SCr of 0.68 mg/dL).    Allergies  Allergen Reactions   Atorvastatin Other (See Comments)    aches   Ramipril Other (See Comments)    cough    Antimicrobials this admission: 5/30 Zosyn >>   Dose adjustments this admission:  Microbiology results: No cultures collected at this time  Thank you for allowing pharmacy to be a part of this patient's care.  Darnelle Bos, PharmD 07/08/2021 3:41 PM

## 2021-07-08 NOTE — Plan of Care (Signed)

## 2021-07-08 NOTE — ED Notes (Signed)
Dr. Joni Fears at the bedside for pt evaluation

## 2021-07-08 NOTE — H&P (Signed)
History and Physical    Patient: Jacob Montes HUD:[id number] DOB: 1940/11/01 DOA: 07/08/2021 DOS: the patient was seen and examined on 07/08/2021 PCP: Jerrol Banana., MD  Patient coming from: Home  Chief Complaint:  Chief Complaint  Patient presents with   Abdominal Pain   Fever   HPI: Jacob Montes is a 81 y.o. male with medical history significant for obesity, coronary artery disease, aortic stenosis status post TAVR, hypertension, GERD, dyslipidemia who presents to the ER for evaluation of persistent fevers for 1 week. Patient states that he developed epigastric/right upper quadrant pain about a week ago.  Pain started after he ate a meal and he attributed it to same.  He rated his pain at a 7 x 10 in intensity at its worst and it was nonradiating.  He did have nausea and 1 episode of emesis.  He was seen by his primary care provider for evaluation of the pain and was placed empirically on doxycycline for presumed diverticulitis. He had blood work done and was noted to have significant transaminitis and so had an ultrasound which showed no acute findings. Patient states that his abdominal pain has resolved.  He is able to tolerate his oral diet but continues to have fever with a Tmax of 102 so he presented to the ER for further evaluation. He denies having any cough, no urinary symptoms, no changes in his bowel habits, no headache, no dizziness, no lightheadedness, no leg swelling, no chest pain, no blurred vision or focal deficit. He denies having any discoloration of his skin or eyes and no changes in the color of his bowel movements. Chart review shows marked improvement in his transaminitis.  AST has normalized but ALT remains elevated.  Total bilirubin also shows a downward trend. MRCP shows a vague areas of heterogeneous enhancement, ill-defined on all submitted sequences raising the question of inflammatory pseudo lesions in the liver potentially related to underlying cholangitis  in the setting of suspected coalescent sludge and or collection of small stones in the distal common bile duct. No focal fluid though areas could represent early hepatic abscess. No current frank evidence of obstruction. Given that these areas have a near mass like appearance would suggest delayed imaging in 6-8 weeks outside of the acute setting following therapy with MRI utilizing Eovist contrast to exclude worsening or underlying neoplasm. 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  Gastroenterology was consulted in the ER and ERCP is planned for Thursday 07/10/21    Review of Systems: As mentioned in the history of present illness. All other systems reviewed and are negative. 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 s/p 3-v CABG in 07/2007   Carotid artery disease ([place])    a. 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   GERD (gastroesophageal reflux disease)    HLD (hyperlipidemia)    Hyperlipidemia 10/15/2015   Hypertension    Nephrolithiasis 11/16/2018   Pulmonary embolism ([place]) 08/2010   Skin cancer    Sleep apnea    Stroke [facility]    Past Surgical History:  Procedure Laterality Date   Carotid surgery Left 03/08/2007  CHOLECYSTECTOMY  02/16/2018   ERCP N/A 11/18/2018   Procedure: ENDOSCOPIC RETROGRADE CHOLANGIOPANCREATOGRAPHY (ERCP);  Surgeon: [name], MD;  Location: [facility] ENDOSCOPY;  Service: Endoscopy;  Laterality: N/A;   Heart Bypass N/A 07/2007   left shoulder surgery Left 06/03/2009   RIGHT AND LEFT HEART CATH  07/18/2007   right shoulder surgery  2003   TEE WITHOUT CARDIOVERSION N/A 11/21/2018    Procedure: TRANSESOPHAGEAL ECHOCARDIOGRAM (TEE);  Surgeon: Wellington Hampshire, MD;  Location: [facility] ORS;  Service: Cardiovascular;  Laterality: N/A;   TVAR     Social History:  reports that he has quit smoking. His smoking use included cigarettes. He has never used smokeless tobacco. He reports that he does not drink alcohol and does not use drugs.  Allergies  Allergen Reactions   Atorvastatin Other (See Comments)    aches   Ramipril Other (See Comments)    cough    Family History  Problem Relation Age of Onset   Hypertension Mother    Stroke Mother    Lung cancer Father    Non-Hodgkin's lymphoma Brother     Prior to Admission medications   Medication Sig Start Date End Date Taking? Authorizing Provider  amLODipine (NORVASC) 10 MG tablet Take 1 tablet by mouth once daily 06/04/21  Yes Jerrol Banana., MD  aspirin 81 MG chewable tablet Chew 81 mg by mouth daily.   Yes [provider]  docusate sodium (COLACE) 250 MG capsule Take 250 mg by mouth daily as needed for constipation.    Yes [provider]  furosemide (LASIX) 40 MG tablet Take 1 tablet (40 mg total) by mouth 3 (three) times a week. Patient taking differently: Take 40 mg by mouth 3 (three) times a week. Mon/wed/fri 08/21/20  Yes Gollan, Kathlene November, MD  metoprolol tartrate (LOPRESSOR) 25 MG tablet Take 1 tablet (25 mg total) by mouth 2 (two) times daily. 08/21/20  Yes Gollan, Kathlene November, MD  montelukast (SINGULAIR) 10 MG tablet Take 1 tablet (10 mg total) by mouth at bedtime as needed (allergies). 05/22/19  Yes Jerrol Banana., MD  Multiple Vitamin (MULTIVITAMIN) tablet Take 1 tablet by mouth daily.   Yes [provider]  omeprazole (PRILOSEC) 20 MG capsule Take 20 mg by mouth daily. As needed   Yes [provider]  potassium chloride (KLOR-CON) 10 MEQ tablet Take 1 tablet (10 mEq total) by mouth 3 (three) times a week. Take with lasix Patient taking differently: Take 10 mEq by mouth  3 (three) times a week. Take with lasix mon wed frid 08/21/20 07/08/21 Yes Gollan, Kathlene November, MD  rosuvastatin (CRESTOR) 20 MG tablet Take 1 tablet by mouth once daily 05/21/21  Yes Jerrol Banana., MD  tamsulosin [facility]) 0.4 MG CAPS capsule Take 1 capsule by mouth once daily 12/18/20  Yes Jerrol Banana., MD  doxycycline (VIBRA-TABS) 100 MG tablet Take 1 tablet (100 mg total) by mouth 2 (two) times daily. for 5 days Patient not taking: Reported on 07/08/2021 07/03/21   Jerrol Banana., MD  Vitamins/Minerals TABS Take by mouth.  Patient not taking: Reported on 07/08/2021 08/01/13   [provider]    Physical Exam: Vitals:   07/08/21 1130 07/08/21 1200 07/08/21 1230 07/08/21 1300  BP: (!) 136/58 (!) 129/50 (!) 137/43 (!) 142/46  Pulse: 64 60 65 62  Resp:    18  Temp:      TempSrc:      SpO2: 96% 97% 99%  96%  Weight:      Height:       Physical Exam Vitals and nursing note reviewed.  Constitutional:      Appearance: He is obese.  HENT:     Head: Normocephalic and atraumatic.     Mouth/Throat:     Mouth: Mucous membranes are moist.  Eyes:     Extraocular Movements: Extraocular movements intact.     Pupils: Pupils are equal, round, and reactive to light.  Cardiovascular:     Rate and Rhythm: Normal rate and regular rhythm.  Pulmonary:     Effort: Pulmonary effort is normal.     Breath sounds: Normal breath sounds.  Abdominal:     General: Bowel sounds are normal.     Palpations: Abdomen is soft.     Comments: Central adiposity, no abdominal pain  Skin:    General: Skin is warm and dry.  Neurological:     General: No focal deficit present.     Mental Status: He is alert.  Psychiatric:        Mood and Affect: Mood normal.        Behavior: Behavior normal.    Data Reviewed: Relevant notes from primary care and specialist visits, past discharge summaries as available in EHR, including Care Everywhere. Prior diagnostic testing as pertinent to  current admission diagnoses Updated medications and problem lists for reconciliation ED course, including vitals, labs, imaging, treatment and response to treatment Triage notes, nursing and pharmacy notes and ED provider's notes Notable results as noted in HPI Labs reviewed.  Lipase 17, sodium 136, potassium 3.8, chloride 103, bicarb 25, glucose 215, BUN 19, creatinine 0.68, calcium 9.5, total protein 6.9, albumin 3.5, AST 38, ALT 80, alk phos 125, total bili 1.3, lactic acid less than 0.3, white count 11.0, hemoglobin 13.4, hematocrit 39.4, RDW 13.4, platelet count 151 There are no new results to review at this time.  Assessment and Plan: * Cholangitis Patient presents for evaluation of abdominal pain associated with fever and transaminitis which shows a downward trend MRCP shows vague areas of heterogeneous enhancement, ill-defined on all submitted sequences raising the question of inflammatory pseudolesions in the liver potentially related to underlying cholangitis in the setting of suspected coalescent sludge and or collection of small stones in the distal common bile duct. No focal fluid though areas could represent early hepatic abscess. No current frank evidence of obstruction. Given that these areas have a near masslike appearance would suggest delayed imaging in 6-8 weeks outside of the acute setting following therapy with MRI utilizing Eovist contrast to exclude worsening or underlying neoplasm. We will place patient empirically on Zosyn adjusted to renal function GI has been consulted and plans ERCP for 07/10/21  Coronary artery disease involving native coronary artery of native heart without angina pectoris Hold aspirin Continue metoprolol and atorvastatin  Morbid obesity (HCC) BMI 50.35 kg/m2 Complicates overall prognosis and care Lifestyle modification and exercise has been discussed with patient  Benign prostatic hyperplasia without urinary  obstruction Stable Continue Flomax  Benign essential hypertension Blood pressure is stable Continue amlodipine and metoprolol  Aortic stenosis S/P TAVR Stable      Advance Care Planning:   Code Status: Full Code   Consults: Gastroenterology  Family Communication: Greater than 50% of time was spent discussing patient's condition and plan of care with him and his wife at the bedside.  All questions and concerns have been addressed.  They verbalized understanding and agree with the plan.  Severity of  Illness: The appropriate patient status for this patient is INPATIENT. Inpatient status is judged to be reasonable and necessary in order to provide the required intensity of service to ensure the patient's safety. The patient's presenting symptoms, physical exam findings, and initial radiographic and laboratory data in the context of their chronic comorbidities is felt to place them at high risk for further clinical deterioration. Furthermore, it is not anticipated that the patient will be medically stable for discharge from the hospital within 2 midnights of admission.   * I certify that at the point of admission it is my clinical judgment that the patient will require inpatient hospital care spanning beyond 2 midnights from the point of admission due to high intensity of service, high risk for further deterioration and high frequency of surveillance required.*  Author: Collier Bullock, MD 07/08/2021 3:25 PM  For on call review [email or web address].

## 2021-07-08 NOTE — Progress Notes (Signed)
PHARMACIST - PHYSICIAN COMMUNICATION  CONCERNING:  Enoxaparin (Lovenox) for DVT Prophylaxis    RECOMMENDATION: Patient was prescribed enoxaparin '40mg'$  q24 hours for VTE prophylaxis.   Filed Weights   07/08/21 0910  Weight: 104.8 kg (231 lb)    Body mass index is 37.28 kg/m.  Estimated Creatinine Clearance: 83.5 mL/min (by C-G formula based on SCr of 0.68 mg/dL).   Based on [place] patient is candidate for enoxaparin 0.'5mg'$ /kg TBW SQ every 24 hours based on BMI being >30.  DESCRIPTION: Pharmacy has adjusted enoxaparin dose per [facility] policy.  Patient is now receiving enoxaparin 52.5 mg every 24 hours   Benita Gutter 07/08/2021 3:20 PM

## 2021-07-08 NOTE — Assessment & Plan Note (Signed)
Status post ERCP, sphincterotomy and stone removal.  Received IV Zosyn in house.  Large on p.o. Augmentin for 8 more days Advised to hold Crestor until LFT back to normal

## 2021-07-08 NOTE — Consult Note (Signed)
Jonathon Bellows , MD [address] [place], [place], [place], [address] [address], [place], [place], [place], [address] Phone: [phone]  Fax: [phone]  Consultation  Referring Provider:     No ref. provider found Primary Care Physician:  Jerrol Banana., MD Primary Gastroenterologist:Dr Allen Norris Reason for Consultation:     Cholangitis  Date of Admission:  07/08/2021 Date of Consultation:  07/08/2021         HPI:   Jacob Montes is a 81 y.o. male presented to Dr. Rosanna Randy with abdominal pain on 07/03/2021, was commenced on doxycycline.  History of constipation and diverticulitis.  X-ray of the abdomen on 07/04/2021 showed small stool burden otherwise no abnormalities At that time his LFTs were significantly elevated with a total bilirubin of 5.6 AST of 302 ALT of 382 and alkaline phosphatase of 202.  A day later the LFTs were rechecked and the total bilirubin had improved as well as the transaminases.  He presented to the ER with abdominal pain and fever.  He completed his course of doxycycline.  Presently his LFTs have significantly improved but his right upper quadrant ultrasound showed no biliary dilation and was s/p cholecystectomy.  He has had an ERCP with Dr. Allen Norris in 11/18/2018 where choledocholithiasis was found and was extracted.  Sphincterotomy was performed.  I subsequently recommended we perform an MRCP and that showed ill-defined question of inflammatory pseudo lesions in the liver related to underlying cholangitis, possible sludge versus small stones in the distal common bile duct almost near masslike appearance recommended repeat imaging in 6 to 8 weeks with MRI.  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  He has had fevers at 89 at home. Presently no pain but has some chills , no rigors. No nausea or vomiting.   On admission hemoglobin 13.4 g. Past Medical History:   Diagnosis Date   Aortic stenosis    a. s/p TAVR 03/2017   CAD (coronary artery disease)    a. s/p 3-v CABG in 07/2007   Carotid artery disease ([place])    a. 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   GERD (gastroesophageal reflux disease)    HLD (hyperlipidemia)    Hyperlipidemia 10/15/2015   Hypertension    Nephrolithiasis 11/16/2018   Pulmonary embolism ([place]) 08/2010   Skin cancer    Sleep apnea    Stroke [facility])     Past Surgical History:  Procedure Laterality Date   Carotid surgery Left 03/08/2007   CHOLECYSTECTOMY  02/16/2018   ERCP N/A 11/18/2018   Procedure: ENDOSCOPIC RETROGRADE CHOLANGIOPANCREATOGRAPHY (ERCP);  Surgeon: [name], MD;  Location: [facility];  Service: Endoscopy;  Laterality: N/A;   Heart Bypass N/A 07/2007   left shoulder surgery Left 06/03/2009   RIGHT AND LEFT HEART CATH  07/18/2007   right shoulder surgery  2003   TEE WITHOUT CARDIOVERSION N/A 11/21/2018   Procedure: TRANSESOPHAGEAL ECHOCARDIOGRAM (TEE);  Surgeon: Wellington Hampshire, MD;  Location: [facility] ORS;  Service: Cardiovascular;  Laterality: N/A;   TVAR      Prior to Admission medications   Medication Sig Start Date End Date Taking? Authorizing Provider  amLODipine (NORVASC) 10 MG tablet Take 1 tablet by mouth once daily 06/04/21  Yes Jerrol Banana., MD  aspirin 81 MG chewable tablet  Chew 81 mg by mouth daily.   Yes [provider]  docusate sodium (COLACE) 250 MG capsule Take 250 mg by mouth daily as needed for constipation.    Yes [provider]  furosemide (LASIX) 40 MG tablet Take 1 tablet (40 mg total) by mouth 3 (three) times a week. Patient taking differently: Take 40 mg by mouth 3 (three) times a week. Mon/wed/fri 08/21/20  Yes Gollan, Kathlene November, MD  metoprolol tartrate (LOPRESSOR) 25 MG tablet Take 1 tablet (25 mg total) by mouth 2 (two) times daily. 08/21/20   Yes Gollan, Kathlene November, MD  montelukast (SINGULAIR) 10 MG tablet Take 1 tablet (10 mg total) by mouth at bedtime as needed (allergies). 05/22/19  Yes Jerrol Banana., MD  Multiple Vitamin (MULTIVITAMIN) tablet Take 1 tablet by mouth daily.   Yes [provider]  omeprazole (PRILOSEC) 20 MG capsule Take 20 mg by mouth daily. As needed   Yes [provider]  potassium chloride (KLOR-CON) 10 MEQ tablet Take 1 tablet (10 mEq total) by mouth 3 (three) times a week. Take with lasix Patient taking differently: Take 10 mEq by mouth 3 (three) times a week. Take with lasix mon wed frid 08/21/20 07/08/21 Yes Gollan, Kathlene November, MD  rosuvastatin (CRESTOR) 20 MG tablet Take 1 tablet by mouth once daily 05/21/21  Yes Jerrol Banana., MD  tamsulosin [facility]) 0.4 MG CAPS capsule Take 1 capsule by mouth once daily 12/18/20  Yes Jerrol Banana., MD  doxycycline (VIBRA-TABS) 100 MG tablet Take 1 tablet (100 mg total) by mouth 2 (two) times daily. for 5 days Patient not taking: Reported on 07/08/2021 07/03/21   Jerrol Banana., MD  Vitamins/Minerals TABS Take by mouth.  Patient not taking: Reported on 07/08/2021 08/01/13   [provider]    Family History  Problem Relation Age of Onset   Hypertension Mother    Stroke Mother    Lung cancer Father    Non-Hodgkin's lymphoma Brother      Social History   Tobacco Use   Smoking status: Former    Types: Cigarettes   Smokeless tobacco: Never   Tobacco comments:    Quit in 1979  Vaping Use   Vaping Use: Never used  Substance Use Topics   Alcohol use: Never   Drug use: Never    Allergies as of 07/08/2021 - Review Complete 07/08/2021  Allergen Reaction Noted   Atorvastatin Other (See Comments) 06/11/2005   Ramipril Other (See Comments) 01/12/2006    Review of Systems:    All systems reviewed and negative except where noted in HPI.   Physical Exam:  Vital signs in last 24 hours: Temp:  [98.5 F (36.9 C)]  98.5 F (36.9 C) (05/30 1030) Pulse Rate:  [60-68] 62 (05/30 1300) Resp:  [17-18] 18 (05/30 1300) BP: (126-143)/(43-58) 142/46 (05/30 1300) SpO2:  [93 %-99 %] 96 % (05/30 1300) Weight:  [104.8 kg] 104.8 kg (05/30 0910)   General:   Pleasant, cooperative in NAD Head:  Normocephalic and atraumatic. Eyes:   No icterus.   Conjunctiva pink. PERRLA. Ears:  Normal auditory acuity. Neck:  Supple; no masses or thyroidomegaly Lungs: Respirations even and unlabored. Lungs clear to auscultation bilaterally.   No wheezes, crackles, or rhonchi.  Heart:  Regular rate and rhythm;  Without murmur, clicks, rubs or gallops Abdomen:  Soft, nondistended, nontender. Normal bowel sounds. No appreciable masses or hepatomegaly.  No rebound or guarding.  Neurologic:  Alert  and oriented x3;  grossly normal neurologically. Skin:  Intact without significant lesions or rashes. Cervical Nodes:  No significant cervical adenopathy. Psych:  Alert and cooperative. Normal affect.  LAB RESULTS: Recent Labs    07/08/21 0914  WBC 11.0*  HGB 13.4  HCT 39.4  PLT 151   BMET Recent Labs    07/08/21 0914  NA 136  K 3.8  CL 103  CO2 25  GLUCOSE 215*  BUN 19  CREATININE 0.68  CALCIUM 9.5   LFT Recent Labs    07/08/21 0914  PROT 6.9  ALBUMIN 3.5  AST 38  ALT 80*  ALKPHOS 125  BILITOT 1.3*   PT/INR No results for input(s): LABPROT, INR in the last 72 hours.  STUDIES: MR 3D Recon At Scanner  Result Date: 07/08/2021 CLINICAL DATA:  Fever since last Tuesday with upper abdominal pain and elevated bilirubin with negative ultrasound. EXAM: MRI ABDOMEN WITHOUT AND WITH CONTRAST (INCLUDING MRCP) TECHNIQUE: Multiplanar multisequence MR imaging of the abdomen was performed both before and after the administration of intravenous contrast. Heavily T2-weighted images of the biliary and pancreatic ducts were obtained, and three-dimensional MRCP images were rendered by post processing. CONTRAST:  76m GADAVIST  GADOBUTROL 1 MMOL/ML IV SOLN COMPARISON:  Ultrasound of Jul 05, 2021 FINDINGS: Lower chest: No effusion or consolidation at the lung bases. Hepatobiliary: No substantial biliary duct dilation but with filling defect in the distal common bile duct that may reflect a group of small stones or coalescent sludgew. (Image 52/14) up to 8 mm greatest craniocaudal extent and approximately 4 x 3 mm in the axial plane but again without substantial biliary duct dilation following cholecystectomy. Vague areas of T1 hypointensity and T2 hypointensity near the cephalad RIGHT and LEFT hemiliver with corresponding heterogeneity of enhancement, slightly more pronounced arterial phase enhancement. The portal vein is patent. Areas of heterogeneity show a persistent enhancement across multiple phases with perhaps mild fading pattern and without defined margins, largest area measuring approximately 3.8 x 1.9 cm (image 20/23) Vague areas also in the LEFT hepatic lobe are less numerous. Pancreas: Mild pancreatic atrophy. No ductal dilation or sign of inflammation. Intrinsic T1 signal with mild preservation. Spleen:  Normal size and contour without visible lesion. Adrenals/Urinary Tract: No suspicious renal lesion, hydronephrosis or perinephric stranding with normal appearance of the bilateral adrenal glands. Stomach/Bowel: No acute findings to the extent evaluated on this abdominal MRI. There is a hyperenhancing lesion associated with the greater curvature of the stomach near the fundus (image 24/25) 11 mm. Enhancement characteristics are similar to that of adjacent spleen and the lesion is relatively homogeneous. Assessment of previous imaging shows this is not changed since October of 2022 and in fact is stable dating back to October of 2020. Vascular/Lymphatic: Collateral pathways in the LEFT retroperitoneum are stable and not fully assessed. No adenopathy in the abdomen or in the pelvis. Other:  No ascites. Musculoskeletal: Schmorl's  nodes and vertebral hemangiomata not changed since prior CT imaging. IMPRESSION: 1. Vague areas of heterogeneous enhancement, ill-defined on all submitted sequences raising the question of inflammatory pseudolesions in the liver potentially related to underlying cholangitis in the setting of suspected coalescent sludge and or collection of small stones in the distal common bile duct. No focal fluid though areas could represent early hepatic abscess. No current frank evidence of obstruction. Given that these areas have a near masslike appearance would suggest delayed imaging in 6-8 weeks outside of the acute setting following therapy with MRI utilizing Eovist contrast  to exclude worsening or underlying neoplasm. 2. 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 Heat damaged red blood cell study may be helpful for further evaluation. This represents a benign or indolent process based on behavior as it is not changed since October of 2020. Electronically Signed   By: Zetta Bills M.D.   On: 07/08/2021 14:04   MR ABDOMEN MRCP W WO CONTAST  Result Date: 07/08/2021 CLINICAL DATA:  Fever since last Tuesday with upper abdominal pain and elevated bilirubin with negative ultrasound. EXAM: MRI ABDOMEN WITHOUT AND WITH CONTRAST (INCLUDING MRCP) TECHNIQUE: Multiplanar multisequence MR imaging of the abdomen was performed both before and after the administration of intravenous contrast. Heavily T2-weighted images of the biliary and pancreatic ducts were obtained, and three-dimensional MRCP images were rendered by post processing. CONTRAST:  33m GADAVIST GADOBUTROL 1 MMOL/ML IV SOLN COMPARISON:  Ultrasound of Jul 05, 2021 FINDINGS: Lower chest: No effusion or consolidation at the lung bases. Hepatobiliary: No substantial biliary duct  dilation but with filling defect in the distal common bile duct that may reflect a group of small stones or coalescent sludgew. (Image 52/14) up to 8 mm greatest craniocaudal extent and approximately 4 x 3 mm in the axial plane but again without substantial biliary duct dilation following cholecystectomy. Vague areas of T1 hypointensity and T2 hypointensity near the cephalad RIGHT and LEFT hemiliver with corresponding heterogeneity of enhancement, slightly more pronounced arterial phase enhancement. The portal vein is patent. Areas of heterogeneity show a persistent enhancement across multiple phases with perhaps mild fading pattern and without defined margins, largest area measuring approximately 3.8 x 1.9 cm (image 20/23) Vague areas also in the LEFT hepatic lobe are less numerous. Pancreas: Mild pancreatic atrophy. No ductal dilation or sign of inflammation. Intrinsic T1 signal with mild preservation. Spleen:  Normal size and contour without visible lesion. Adrenals/Urinary Tract: No suspicious renal lesion, hydronephrosis or perinephric stranding with normal appearance of the bilateral adrenal glands. Stomach/Bowel: No acute findings to the extent evaluated on this abdominal MRI. There is a hyperenhancing lesion associated with the greater curvature of the stomach near the fundus (image 24/25) 11 mm. Enhancement characteristics are similar to that of adjacent spleen and the lesion is relatively homogeneous. Assessment of previous imaging shows this is not changed since October of 2022 and in fact is stable dating back to October of 2020. Vascular/Lymphatic: Collateral pathways in the LEFT retroperitoneum are stable and not fully assessed. No adenopathy in the abdomen or in the pelvis. Other:  No ascites. Musculoskeletal: Schmorl's nodes and vertebral hemangiomata not changed since prior CT imaging. IMPRESSION: 1. Vague areas of heterogeneous enhancement, ill-defined on all submitted sequences raising the  question of inflammatory pseudolesions in the liver potentially related to underlying cholangitis in the setting of suspected coalescent sludge and or collection of small stones in the distal common bile duct. No focal fluid though areas could represent early hepatic abscess. No current frank evidence of obstruction. Given that these areas have a near masslike appearance would suggest delayed imaging in 6-8 weeks outside of the acute setting following therapy with MRI utilizing Eovist contrast to exclude worsening or underlying neoplasm. 2. Small enhancing lesion near the greater curvature of the stomach is favored to represent splenosis or small splenule those difficult to separate from the stomach, therefore would include the possibility of gastrointestinal stromal  tumor. Heat damaged red blood cell study may be helpful for further evaluation. This represents a benign or indolent process based on behavior as it is not changed since October of 2020. Electronically Signed   By: Zetta Bills M.D.   On: 07/08/2021 14:04      Impression / Plan:   Jacob Montes is a 81 y.o. y/o male with recent history of abdominal pain fever treated with doxycycline.  Significantly elevated liver function test on Jul 03, 2021.  Ultrasound shows no gross abnormalities.  History of cholecystectomy and sphincterotomy for choledocholithiasis in 2020.  MRCP suggestive of choledocholithiasis and concerns for liver abscesses.  Likely has had cholangitis.  Plan 1.  IV antibiotics, IV fluids close monitorin watch for signs of sepsis ( tachycardia, hypotensions, fevers, tachypnea )and if present would need to escalate care and may even need to consider PTC drain if urgent otherwise we will plan to perform ERCP on Thursday with Dr. Allen Norris.  May need to also perform upper endoscopy for lesion suggestive of GIST on MRI, if no lesion is seen on EGD will need outpatient EUS  2.  Would recommend ID consult for concern of liver abscesses which  will help determine the duration of antibiotics.  Will require repeat liver imaging in a few weeks time to ensure the lesions which have differentials of abscesses versus liver mass have resolved   I have discussed alternative options, risks & benefits,  which include, but are not limited to, bleeding, infection, perforation,respiratory complication ,drug reaction pancreatitis and death.  The patient agrees with this plan & written consent will be obtained.     Thank you for involving me in the care of this patient.      LOS: 0 days   Jonathon Bellows, MD  07/08/2021, 2:41 PM

## 2021-07-08 NOTE — Assessment & Plan Note (Signed)
BMI 37.28 kg/m2.  Has comorbidities including CAD and HTN -Lifestyle change to lose weight.

## 2021-07-08 NOTE — Assessment & Plan Note (Signed)
Stable Continue Flomax 

## 2021-07-08 NOTE — Assessment & Plan Note (Signed)
Hold aspirin Continue metoprolol and atorvastatin

## 2021-07-08 NOTE — Assessment & Plan Note (Signed)
Blood pressure is stable Continue amlodipine and metoprolol

## 2021-07-08 NOTE — Assessment & Plan Note (Signed)
S/P TAVR. Stable. 

## 2021-07-09 DIAGNOSIS — R748 Abnormal levels of other serum enzymes: Secondary | ICD-10-CM

## 2021-07-09 DIAGNOSIS — N400 Enlarged prostate without lower urinary tract symptoms: Secondary | ICD-10-CM

## 2021-07-09 DIAGNOSIS — I350 Nonrheumatic aortic (valve) stenosis: Secondary | ICD-10-CM

## 2021-07-09 DIAGNOSIS — I2510 Atherosclerotic heart disease of native coronary artery without angina pectoris: Secondary | ICD-10-CM | POA: Diagnosis not present

## 2021-07-09 DIAGNOSIS — I10 Essential (primary) hypertension: Secondary | ICD-10-CM

## 2021-07-09 DIAGNOSIS — K8309 Other cholangitis: Secondary | ICD-10-CM | POA: Diagnosis not present

## 2021-07-09 LAB — CBC
HCT: 36.1 % — ABNORMAL LOW (ref 39.0–52.0)
Hemoglobin: 12.5 g/dL — ABNORMAL LOW (ref 13.0–17.0)
MCH: 32.6 pg (ref 26.0–34.0)
MCHC: 34.6 g/dL (ref 30.0–36.0)
MCV: 94 fL (ref 80.0–100.0)
Platelets: 158 10*3/uL (ref 150–400)
RBC: 3.84 MIL/uL — ABNORMAL LOW (ref 4.22–5.81)
RDW: 13.3 % (ref 11.5–15.5)
WBC: 10.5 10*3/uL (ref 4.0–10.5)
nRBC: 0 % (ref 0.0–0.2)

## 2021-07-09 LAB — BASIC METABOLIC PANEL
Anion gap: 8 (ref 5–15)
BUN: 16 mg/dL (ref 8–23)
CO2: 25 mmol/L (ref 22–32)
Calcium: 8.7 mg/dL — ABNORMAL LOW (ref 8.9–10.3)
Chloride: 102 mmol/L (ref 98–111)
Creatinine, Ser: 0.6 mg/dL — ABNORMAL LOW (ref 0.61–1.24)
GFR, Estimated: >60 mL/min (ref >60)
Glucose, Bld: 101 mg/dL — ABNORMAL HIGH (ref 70–99)
Potassium: 3.9 mmol/L (ref 3.5–5.1)
Sodium: 135 mmol/L (ref 135–145)

## 2021-07-09 NOTE — Progress Notes (Signed)
Patient care taken over by this RN. Resting in room without complaints. Family at bedside.

## 2021-07-09 NOTE — Progress Notes (Signed)
Jacob Montes , MD [address] [place], [place], [place], [address] [address] [place], [place], [place], [address] Phone: [phone]  Fax: [phone]   Jacob Montes is being followed for cholangitis  Day 1 of follow up   Subjective: Feels well , had fever last night no chills or rigors   Objective: Vital signs in last 24 hours: Vitals:   07/08/21 2012 07/08/21 2356 07/09/21 0447 07/09/21 1132  BP: (!) 165/57 (!) 162/46 (!) 142/47 (!) 142/51  Pulse: 89 81 68 (!) 57  Resp: '20 20 17 17  '$ Temp: 98.8 F (37.1 C) 100.1 F (37.8 C) 98.5 F (36.9 C) 98.1 F (36.7 C)  TempSrc:   Oral Oral  SpO2: 93% 93% 93% 96%  Weight:      Height:       Weight change:   Intake/Output Summary (Last 24 hours) at 07/09/2021 1328 Last data filed at 07/09/2021 0321 Gross per 24 hour  Intake 584.76 ml  Output --  Net 584.76 ml     Exam: Heart:: Regular rate and rhythm, S1S2 present, or without murmur or extra heart sounds Lungs: normal, clear to auscultation, and clear to auscultation and percussion Abdomen: soft, nontender, normal bowel sounds   Lab Results: '@LABTEST2'$ @ Micro Results: No results found for this or any previous visit (from the past 240 hour(s)). Studies/Results: MR 3D Recon At Scanner  Result Date: 07/08/2021 CLINICAL DATA:  Fever since last Tuesday with upper abdominal pain and elevated bilirubin with negative ultrasound. EXAM: MRI ABDOMEN WITHOUT AND WITH CONTRAST (INCLUDING MRCP) TECHNIQUE: Multiplanar multisequence MR imaging of the abdomen was performed both before and after the administration of intravenous contrast. Heavily T2-weighted images of the biliary and pancreatic ducts were obtained, and three-dimensional MRCP images were rendered by post processing. CONTRAST:  39m GADAVIST GADOBUTROL 1 MMOL/ML IV SOLN COMPARISON:  Ultrasound of Jul 05, 2021 FINDINGS: Lower chest: No effusion or consolidation at the lung bases. Hepatobiliary: No substantial biliary  duct dilation but with filling defect in the distal common bile duct that may reflect a group of small stones or coalescent sludgew. (Image 52/14) up to 8 mm greatest craniocaudal extent and approximately 4 x 3 mm in the axial plane but again without substantial biliary duct dilation following cholecystectomy. Vague areas of T1 hypointensity and T2 hypointensity near the cephalad RIGHT and LEFT hemiliver with corresponding heterogeneity of enhancement, slightly more pronounced arterial phase enhancement. The portal vein is patent. Areas of heterogeneity show a persistent enhancement across multiple phases with perhaps mild fading pattern and without defined margins, largest area measuring approximately 3.8 x 1.9 cm (image 20/23) Vague areas also in the LEFT hepatic lobe are less numerous. Pancreas: Mild pancreatic atrophy. No ductal dilation or sign of inflammation. Intrinsic T1 signal with mild preservation. Spleen:  Normal size and contour without visible lesion. Adrenals/Urinary Tract: No suspicious renal lesion, hydronephrosis or perinephric stranding with normal appearance of the bilateral adrenal glands. Stomach/Bowel: No acute findings to the extent evaluated on this abdominal MRI. There is a hyperenhancing lesion associated with the greater curvature of the stomach near the fundus (image 24/25) 11 mm. Enhancement characteristics are similar to that of adjacent spleen and the lesion is relatively homogeneous. Assessment of previous imaging shows this is not changed since October of 2022 and in fact is stable dating back to October of 2020. Vascular/Lymphatic: Collateral pathways in the LEFT retroperitoneum are stable and not fully assessed. No adenopathy in the abdomen or in the pelvis. Other:  No ascites. Musculoskeletal: Schmorl's nodes and vertebral hemangiomata not changed since prior CT imaging. IMPRESSION: 1. Vague areas of heterogeneous enhancement, ill-defined on all submitted sequences raising the  question of inflammatory pseudolesions in the liver potentially related to underlying cholangitis in the setting of suspected coalescent sludge and or collection of small stones in the distal common bile duct. No focal fluid though areas could represent early hepatic abscess. No current frank evidence of obstruction. Given that these areas have a near masslike appearance would suggest delayed imaging in 6-8 weeks outside of the acute setting following therapy with MRI utilizing Eovist contrast to exclude worsening or underlying neoplasm. 2. 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 Heat damaged red blood cell study may be helpful for further evaluation. This represents a benign or indolent process based on behavior as it is not changed since October of 2020. Electronically Signed   By: Zetta Bills M.D.   On: 07/08/2021 14:04   MR ABDOMEN MRCP W WO CONTAST  Result Date: 07/08/2021 CLINICAL DATA:  Fever since last Tuesday with upper abdominal pain and elevated bilirubin with negative ultrasound. EXAM: MRI ABDOMEN WITHOUT AND WITH CONTRAST (INCLUDING MRCP) TECHNIQUE: Multiplanar multisequence MR imaging of the abdomen was performed both before and after the administration of intravenous contrast. Heavily T2-weighted images of the biliary and pancreatic ducts were obtained, and three-dimensional MRCP images were rendered by post processing. CONTRAST:  6m GADAVIST GADOBUTROL 1 MMOL/ML IV SOLN COMPARISON:  Ultrasound of Jul 05, 2021 FINDINGS: Lower chest: No effusion or consolidation at the lung bases. Hepatobiliary: No substantial biliary duct dilation but with filling defect in the distal common bile duct that may reflect a group of small stones or coalescent sludgew. (Image 52/14) up to 8 mm greatest craniocaudal  extent and approximately 4 x 3 mm in the axial plane but again without substantial biliary duct dilation following cholecystectomy. Vague areas of T1 hypointensity and T2 hypointensity near the cephalad RIGHT and LEFT hemiliver with corresponding heterogeneity of enhancement, slightly more pronounced arterial phase enhancement. The portal vein is patent. Areas of heterogeneity show a persistent enhancement across multiple phases with perhaps mild fading pattern and without defined margins, largest area measuring approximately 3.8 x 1.9 cm (image 20/23) Vague areas also in the LEFT hepatic lobe are less numerous. Pancreas: Mild pancreatic atrophy. No ductal dilation or sign of inflammation. Intrinsic T1 signal with mild preservation. Spleen:  Normal size and contour without visible lesion. Adrenals/Urinary Tract: No suspicious renal lesion, hydronephrosis or perinephric stranding with normal appearance of the bilateral adrenal glands. Stomach/Bowel: No acute findings to the extent evaluated on this abdominal MRI. There is a hyperenhancing lesion associated with the greater curvature of the stomach near the fundus (image 24/25) 11 mm. Enhancement characteristics are similar to that of adjacent spleen and the lesion is relatively homogeneous. Assessment of previous imaging shows this is not changed since October of 2022 and in fact is stable dating back to October of 2020. Vascular/Lymphatic: Collateral pathways in the LEFT retroperitoneum are stable and not fully assessed. No adenopathy in the abdomen or in the pelvis. Other:  No ascites. Musculoskeletal: Schmorl's nodes and vertebral hemangiomata not changed since prior CT imaging. IMPRESSION: 1. Vague areas of heterogeneous enhancement, ill-defined on all submitted sequences raising the question of inflammatory pseudolesions in the liver potentially related to underlying cholangitis  in the setting of suspected coalescent sludge and or collection of small stones in  the distal common bile duct. No focal fluid though areas could represent early hepatic abscess. No current frank evidence of obstruction. Given that these areas have a near masslike appearance would suggest delayed imaging in 6-8 weeks outside of the acute setting following therapy with MRI utilizing Eovist contrast to exclude worsening or underlying neoplasm. 2. 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 Heat damaged red blood cell study may be helpful for further evaluation. This represents a benign or indolent process based on behavior as it is not changed since October of 2020. Electronically Signed   By: Zetta Bills M.D.   On: 07/08/2021 14:04   Medications: I have reviewed the patient's current medications. Scheduled Meds:  amLODipine  10 mg Oral Daily   enoxaparin (LOVENOX) injection  0.5 mg/kg Subcutaneous Q24H   metoprolol tartrate  25 mg Oral BID   multivitamin with minerals  1 tablet Oral Daily   pantoprazole  40 mg Oral Daily   rosuvastatin  20 mg Oral Daily   tamsulosin  0.4 mg Oral Daily   Continuous Infusions:  sodium chloride 50 mL/hr at 07/09/21 1245   piperacillin-tazobactam (ZOSYN)  IV 3.375 g (07/09/21 1245)   PRN Meds:.montelukast, ondansetron **OR** ondansetron (ZOFRAN) IV   Assessment: Principal Problem:   Cholangitis Active Problems:   Aortic stenosis   Benign essential hypertension   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 male with recent history of abdominal pain fever treated with doxycycline.  Significantly elevated liver function test on Jul 03, 2021.  Ultrasound shows no gross abnormalities.  History of  cholecystectomy and sphincterotomy for choledocholithiasis in 2020.  MRCP suggestive of choledocholithiasis and concerns for liver abscesses.  Likely has had cholangitis.   Plan 1.  IV antibiotics, IV fluids close monitorin watch for signs of sepsis ( tachycardia, hypotensions, fevers, tachypnea ). ERCP  with Dr. Allen Norris tomorrow .  May need to also perform upper endoscopy for lesion suggestive of GIST on MRI, if no lesion is seen on EGD will need outpatient EUS   2.  Would recommend ID consult for concern of liver abscesses which will help determine the duration of antibiotics.  Will require repeat liver imaging in a few weeks time to ensure the lesions which have differentials of abscesses versus liver mass have resolved     LOS: 1 day   Jacob Bellows, MD 07/09/2021, 1:28 PM

## 2021-07-09 NOTE — TOC Initial Note (Signed)
Transition of Care [facility]) - Initial/Assessment Note    Patient Details  Name: Jacob Montes MRN: [id number] Date of Birth: February 08, 1941  Transition of Care [facility]) CM/SW Contact:    Laurena Slimmer, RN Phone Number: 07/09/2021, 9:49 AM  Clinical Narrative:                  Transition of Care [facility]) Screening Note   Patient Details  Name: Jacob Montes Date of Birth: [birth year]   Transition of Care [facility]) CM/SW Contact:    Laurena Slimmer, RN Phone Number: 07/09/2021, 9:49 AM    Transition of Care Department [facility]) has reviewed patient and no TOC needs have been identified at this time. We will continue to monitor patient advancement through interdisciplinary progression rounds. If new patient transition needs arise, please place a TOC consult.          Patient Goals and CMS Choice        Expected Discharge Plan and Services                                                Prior Living Arrangements/Services                       Activities of Daily Living Home Assistive Devices/Equipment: None ADL Screening (condition at time of admission) Patient's cognitive ability adequate to safely complete daily activities?: Yes Is the patient deaf or have difficulty hearing?: No Does the patient have difficulty seeing, even when wearing glasses/contacts?: No Does the patient have difficulty concentrating, remembering, or making decisions?: No Patient able to express need for assistance with ADLs?: Yes Does the patient have difficulty dressing or bathing?: No Independently performs ADLs?: Yes (appropriate for developmental age) Does the patient have difficulty walking or climbing stairs?: No Weakness of Legs: None Weakness of Arms/Hands: None  Permission Sought/Granted                  Emotional Assessment              Admission diagnosis:  Cholangitis [K83.09] Patient Active Problem List   Diagnosis Date Noted   Cholangitis 07/08/2021   Atypical  chest pain 04/22/2021   Coronary artery disease involving native coronary artery of native heart without angina pectoris 04/22/2021   Paroxysmal tachycardia ([place]) 04/22/2021   History of transcatheter aortic valve replacement (TAVR) 04/22/2021   Morbid obesity ([place]) 04/22/2021   CKD (chronic kidney disease) 12/03/2020   Bacteremia    Common bile duct stone    Ascending cholangitis 11/17/2018   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 78/58/8502   Dysmetabolic syndrome 77/41/2878   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   Hyperglycemia 11/16/2018   Tear of meniscus of left knee 05/24/2018   Constipation 10/07/2016   Aortic stenosis 10/15/2015   Benign essential hypertension 10/15/2015   Carotid stenosis, bilateral 10/15/2015   Hypogonadism, 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 [place] [address] Phone: [phone] Fax: [phone]  [facility] [place] [address] Phone: [phone] Fax: [phone]     Social Determinants of Health (SDOH) Interventions    Readmission Risk Interventions     View : No data to display.

## 2021-07-09 NOTE — Progress Notes (Signed)
PROGRESS NOTE  Jacob Montes ZOX:[id number] DOB: [birth year]   PCP: Jerrol Banana., MD  Patient is from: Home.  Lives with his wife.  Independently ambulates at baseline.  DOA: 07/08/2021 LOS: 1  Chief complaints Chief Complaint  Patient presents with   Abdominal Pain   Fever     Brief Narrative / Interim history: 81 year old M with PMH of CAD, AS sp TAVR, HTN, HLD, BPH, GERD and obesity presenting with fever, epigastric and RUQ pain, nausea and vomiting, and admitted for acute cholangitis.  He had elevated LFT.  MRCP showed vague areas of heterogeneous enhancement with concern for possible pseudo lesion, hepatic abscess or mass.  Patient was started on IV Zosyn.  GI consulted and planning ERCP on 07/10/2021.Marland Kitchen    Subjective: Seen and examined earlier this morning.  No major events overnight of this morning.  No complaints.  Patient denies nausea, vomiting, abdominal pain, diarrhea, chest pain or dyspnea.  Denies UTI symptoms.  Objective: Vitals:   07/08/21 2356 07/09/21 0447 07/09/21 1132 07/09/21 1455  BP: (!) 162/46 (!) 142/47 (!) 142/51 (!) 140/44  Pulse: 81 68 (!) 57 (!) 58  Resp: '20 17 17 18  '$ Temp: 100.1 F (37.8 C) 98.5 F (36.9 C) 98.1 F (36.7 C) 98.7 F (37.1 C)  TempSrc:  Oral Oral   SpO2: 93% 93% 96% 96%  Weight:      Height:        Examination:  GENERAL: No apparent distress.  Nontoxic. HEENT: MMM.  Vision and hearing grossly intact.  NECK: Supple.  No apparent JVD.  RESP:  No IWOB.  Fair aeration bilaterally. CVS:  RRR.  2/6 SEM over LUSB and RUSB. ABD/GI/GU: BS+. Abd soft, NTND.  MSK/EXT:  Moves extremities. No apparent deformity. No edema.  SKIN: no apparent skin lesion or wound NEURO: Awake, alert and oriented appropriately.  No apparent focal neuro deficit. PSYCH: Calm. Normal affect.   Procedures:  None  Microbiology summarized: None  Assessment and Plan: * Acute cholangitis Patient presents with fever, epigastric/RUQ pain, nausea and  vomiting.  Has mild temp to 100.1 on admission.  He has mildly elevated LFT but improving.  Lipase within normal.  MRCP equivocal. -Continue IV Zosyn. -Plan for ERCP on 6/1.  Elevated liver enzymes Likely from possible cholangitis.  Resolving. -Continue monitoring  History of CAD Hold aspirin Continue metoprolol and atorvastatin  Morbid obesity (HCC) BMI 37.28 kg/m2.  Has comorbidities including CAD and HTN -Lifestyle change to lose weight.  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 Stable Continue Flomax  Benign essential hypertension Blood pressure is stable Continue amlodipine and metoprolol  Aortic stenosis S/P TAVR Stable     DVT prophylaxis:  Subcu Lovenox.  Code Status: Full code Family Communication: Updated patient's wife at bedside. Level of care: Progressive.  Change level of care to MedSurg Status is: Inpatient Remains inpatient appropriate because: Acute cholangitis   Final disposition: Likely home once medically cleared Consultants:  Gastroenterology  Sch Meds:  Scheduled Meds:  amLODipine  10 mg Oral Daily   enoxaparin (LOVENOX) injection  0.5 mg/kg Subcutaneous Q24H   metoprolol tartrate  25 mg Oral BID   multivitamin with minerals  1 tablet Oral Daily   pantoprazole  40 mg Oral Daily   rosuvastatin  20 mg Oral Daily   tamsulosin  0.4 mg Oral Daily   Continuous Infusions:  sodium chloride 50 mL/hr at 07/09/21 1458   piperacillin-tazobactam (ZOSYN)  IV 12.5 mL/hr at 07/09/21 1458   PRN Meds:.montelukast, ondansetron **OR**  ondansetron (ZOFRAN) IV  Antimicrobials: Anti-infectives (From admission, onward)    Start     Dose/Rate Route Frequency Ordered Stop   07/08/21 2100  piperacillin-tazobactam (ZOSYN) IVPB 3.375 g        3.375 g 12.5 mL/hr over 240 Minutes Intravenous Every 8 hours 07/08/21 1540     07/08/21 1800  piperacillin-tazobactam (ZOSYN) IVPB 3.375 g  Status:  Discontinued        3.375 g 100 mL/hr over 30 Minutes Intravenous Every 6 hours 07/08/21  1518 07/08/21 1518   07/08/21 1445  piperacillin-tazobactam (ZOSYN) IVPB 3.375 g        3.375 g 100 mL/hr over 30 Minutes Intravenous  Once 07/08/21 1438 07/08/21 1540        I have personally reviewed the following labs and images: CBC: Recent Labs  Lab 07/03/21 1136 07/04/21 1605 07/08/21 0914 07/09/21 0531  WBC 8.0 6.6 11.0* 10.5  NEUTROABS 5.3 3.2  --   --   HGB 14.8 14.0 13.4 12.5*  HCT 41.4 40.0 39.4 36.1*  MCV 94 95 95.2 94.0  PLT 115* 120* 151 158   BMP &GFR Recent Labs  Lab 07/03/21 1136 07/04/21 1605 07/08/21 0914 07/09/21 0531  NA 140 138 136 135  K 4.7 4.4 3.8 3.9  CL 102 102 103 102  CO2 '23 22 25 25  '$ GLUCOSE 95 107* 215* 101*  BUN '17 14 19 16  '$ CREATININE 0.89 0.80 0.68 0.60*  CALCIUM 9.2 9.2 9.5 8.7*   Estimated Creatinine Clearance: 83.5 mL/min (A) (by C-G formula based on SCr of 0.6 mg/dL (L)). Liver & Pancreas: Recent Labs  Lab 07/03/21 1136 07/04/21 1605 07/08/21 0914  AST 302* 148* 38  ALT 382* 251* 80*  ALKPHOS 202* 218* 125  BILITOT 5.6* 2.2* 1.3*  PROT 6.4 6.5 6.9  ALBUMIN 4.2 4.2 3.5   Recent Labs  Lab 07/08/21 0914  LIPASE 17   No results for input(s): AMMONIA in the last 168 hours. Diabetic: No results for input(s): HGBA1C in the last 72 hours. No results for input(s): GLUCAP in the last 168 hours. Cardiac Enzymes: No results for input(s): CKTOTAL, CKMB, CKMBINDEX, TROPONINI in the last 168 hours. No results for input(s): PROBNP in the last 8760 hours. Coagulation Profile: No results for input(s): INR, PROTIME in the last 168 hours. Thyroid Function Tests: No results for input(s): TSH, T4TOTAL, FREET4, T3FREE, THYROIDAB in the last 72 hours. Lipid Profile: No results for input(s): CHOL, HDL, LDLCALC, TRIG, CHOLHDL, LDLDIRECT in the last 72 hours. Anemia Panel: No results for input(s): VITAMINB12, FOLATE, FERRITIN, TIBC, IRON, RETICCTPCT in the last 72 hours. Urine analysis:    Component Value Date/Time   COLORURINE  AMBER (A) 07/08/2021 0914   APPEARANCEUR HAZY (A) 07/08/2021 0914   LABSPEC 1.031 (H) 07/08/2021 0914   PHURINE 5.0 07/08/2021 0914   GLUCOSEU 50 (A) 07/08/2021 0914   HGBUR NEGATIVE 07/08/2021 0914   BILIRUBINUR NEGATIVE 07/08/2021 0914   BILIRUBINUR negative 10/23/2020 [place] 07/08/2021 0914   PROTEINUR NEGATIVE 07/08/2021 0914   UROBILINOGEN 0.2 10/23/2020 1403   NITRITE NEGATIVE 07/08/2021 0914   LEUKOCYTESUR NEGATIVE 07/08/2021 0914   Sepsis Labs: Invalid input(s): PROCALCITONIN, [place]  Microbiology: No results found for this or any previous visit (from the past 240 hour(s)).  Radiology Studies: No results found.    Ismerai Bin T. [place]  If 7PM-7AM, please contact night-coverage [email or web address] 07/09/2021, 4:48 PM

## 2021-07-09 NOTE — Assessment & Plan Note (Signed)
Recent Labs  Lab 07/08/21 0914 07/10/21 0519 07/11/21 0501 07/11/21 1551 07/12/21 0318  AST 38 36 437*  476* 298* 173*  ALT 80* 54* 321*  322* 296* 217*  ALKPHOS 125 99 251*  265* 291* 240*  BILITOT 1.3* 1.4* 3.3*  3.6* 1.7* 1.4*  PROT 6.9 6.6 5.9*  6.0* 6.9 6.3*  ALBUMIN 3.5 3.4* 2.9*  2.9* 3.2* 3.0*  Recheck LFT at follow-up. Advised to hold Crestor until recheck

## 2021-07-09 NOTE — Hospital Course (Signed)
81 year old M with PMH of CAD, AS sp TAVR, HTN, HLD, BPH, GERD and obesity presenting with fever, epigastric and RUQ pain, nausea and vomiting, and admitted for acute cholangitis.  He had elevated LFT.  MRCP showed vague areas of heterogeneous enhancement with concern for possible pseudo lesion, hepatic abscess or mass.  Patient was started on IV Zosyn.  GI consulted.  Patient underwent ERCP with removal of choledocholithiasis and sludge by biliary sphincterotomy and balloon extraction on 6/1.

## 2021-07-10 ENCOUNTER — Inpatient Hospital Stay: Payer: Medicare Other | Admitting: Anesthesiology

## 2021-07-10 ENCOUNTER — Inpatient Hospital Stay: Payer: Medicare Other

## 2021-07-10 ENCOUNTER — Encounter
Admission: EM | Disposition: A | Discharge status: Home / Self Care | Payer: Self-pay | Source: Home / Self Care | Attending: Student

## 2021-07-10 DIAGNOSIS — K8050 Calculus of bile duct without cholangitis or cholecystitis without obstruction: Secondary | ICD-10-CM

## 2021-07-10 DIAGNOSIS — I2510 Atherosclerotic heart disease of native coronary artery without angina pectoris: Secondary | ICD-10-CM | POA: Diagnosis not present

## 2021-07-10 DIAGNOSIS — K8309 Other cholangitis: Secondary | ICD-10-CM | POA: Diagnosis not present

## 2021-07-10 DIAGNOSIS — R748 Abnormal levels of other serum enzymes: Secondary | ICD-10-CM | POA: Diagnosis not present

## 2021-07-10 HISTORY — PX: ENDOSCOPIC RETROGRADE CHOLANGIOPANCREATOGRAPHY (ERCP) WITH PROPOFOL: SHX5810

## 2021-07-10 LAB — COMPREHENSIVE METABOLIC PANEL WITH GFR
ALT: 54 U/L — ABNORMAL HIGH (ref 0–44)
AST: 36 U/L (ref 15–41)
Albumin: 3.4 g/dL — ABNORMAL LOW (ref 3.5–5.0)
Alkaline Phosphatase: 99 U/L (ref 38–126)
Anion gap: 6 (ref 5–15)
BUN: 16 mg/dL (ref 8–23)
CO2: 27 mmol/L (ref 22–32)
Calcium: 8.9 mg/dL (ref 8.9–10.3)
Chloride: 105 mmol/L (ref 98–111)
Creatinine, Ser: 0.72 mg/dL (ref 0.61–1.24)
GFR, Estimated: >60 mL/min
Glucose, Bld: 112 mg/dL — ABNORMAL HIGH (ref 70–99)
Potassium: 4.3 mmol/L (ref 3.5–5.1)
Sodium: 138 mmol/L (ref 135–145)
Total Bilirubin: 1.4 mg/dL — ABNORMAL HIGH (ref 0.3–1.2)
Total Protein: 6.6 g/dL (ref 6.5–8.1)

## 2021-07-10 LAB — CBC
HCT: 37.4 % — ABNORMAL LOW (ref 39.0–52.0)
Hemoglobin: 12.7 g/dL — ABNORMAL LOW (ref 13.0–17.0)
MCH: 31.8 pg (ref 26.0–34.0)
MCHC: 34 g/dL (ref 30.0–36.0)
MCV: 93.7 fL (ref 80.0–100.0)
Platelets: 181 10*3/uL (ref 150–400)
RBC: 3.99 MIL/uL — ABNORMAL LOW (ref 4.22–5.81)
RDW: 12.8 % (ref 11.5–15.5)
WBC: 10.1 10*3/uL (ref 4.0–10.5)
nRBC: 0 % (ref 0.0–0.2)

## 2021-07-10 LAB — PHOSPHORUS: Phosphorus: 3.5 mg/dL (ref 2.5–4.6)

## 2021-07-10 LAB — MAGNESIUM: Magnesium: 1.8 mg/dL (ref 1.7–2.4)

## 2021-07-10 SURGERY — ENDOSCOPIC RETROGRADE CHOLANGIOPANCREATOGRAPHY (ERCP) WITH PROPOFOL
Anesthesia: General

## 2021-07-10 MED ORDER — ONDANSETRON HCL 4 MG/2ML IJ SOLN
INTRAMUSCULAR | Status: AC
  Filled 2021-07-10: qty 2

## 2021-07-10 MED ORDER — ROCURONIUM BROMIDE 100 MG/10ML IV SOLN
INTRAVENOUS | Status: DC | PRN
  Administered 2021-07-10: 30 mg via INTRAVENOUS

## 2021-07-10 MED ORDER — PHENYLEPHRINE HCL (PRESSORS) 10 MG/ML IV SOLN
INTRAVENOUS | Status: DC | PRN
  Administered 2021-07-10: 80 ug via INTRAVENOUS

## 2021-07-10 MED ORDER — ONDANSETRON HCL 4 MG/2ML IJ SOLN
INTRAMUSCULAR | Status: DC | PRN
  Administered 2021-07-10: 4 mg via INTRAVENOUS

## 2021-07-10 MED ORDER — DICLOFENAC SUPPOSITORY 100 MG
100.0000 mg | Freq: Once | RECTAL | Status: AC
  Administered 2021-07-10: 100 mg via RECTAL

## 2021-07-10 MED ORDER — PROPOFOL 10 MG/ML IV BOLUS
INTRAVENOUS | Status: DC | PRN
  Administered 2021-07-10: 120 mg via INTRAVENOUS

## 2021-07-10 MED ORDER — PHENYLEPHRINE 80 MCG/ML (10ML) SYRINGE FOR IV PUSH (FOR BLOOD PRESSURE SUPPORT)
PREFILLED_SYRINGE | INTRAVENOUS | Status: AC
  Filled 2021-07-10: qty 10

## 2021-07-10 MED ORDER — SUCCINYLCHOLINE CHLORIDE 200 MG/10ML IV SOSY
PREFILLED_SYRINGE | INTRAVENOUS | Status: DC | PRN
  Administered 2021-07-10: 120 mg via INTRAVENOUS

## 2021-07-10 MED ORDER — DICLOFENAC SUPPOSITORY 100 MG
RECTAL | Status: AC
  Filled 2021-07-10: qty 1

## 2021-07-10 MED ORDER — SUGAMMADEX SODIUM 200 MG/2ML IV SOLN
INTRAVENOUS | Status: DC | PRN
  Administered 2021-07-10: 200 mg via INTRAVENOUS

## 2021-07-10 MED ORDER — LIDOCAINE HCL (CARDIAC) PF 100 MG/5ML IV SOSY
PREFILLED_SYRINGE | INTRAVENOUS | Status: DC | PRN
  Administered 2021-07-10: 100 mg via INTRAVENOUS

## 2021-07-10 MED ORDER — LACTATED RINGERS IV SOLN
INTRAVENOUS | Status: DC
  Administered 2021-07-10: 1000 mL via INTRAVENOUS

## 2021-07-10 NOTE — Anesthesia Postprocedure Evaluation (Signed)
Anesthesia Post Note  Patient: Jacob Montes  Procedure(s) Performed: ENDOSCOPIC RETROGRADE CHOLANGIOPANCREATOGRAPHY (ERCP) WITH PROPOFOL  Patient location during evaluation: Endoscopy Anesthesia Type: General Level of consciousness: awake and alert Pain management: pain level controlled Vital Signs Assessment: post-procedure vital signs reviewed and stable Respiratory status: spontaneous breathing, nonlabored ventilation, respiratory function stable and patient connected to nasal cannula oxygen Cardiovascular status: blood pressure returned to baseline and stable Postop Assessment: no apparent nausea or vomiting Anesthetic complications: no   No notable events documented.   Last Vitals:  Vitals:   07/10/21 1029 07/10/21 1039  BP: (!) 130/46 (!) 131/53  Pulse: 69 74  Resp: 16 17  Temp:    SpO2: 91% 91%    Last Pain:  Vitals:   07/10/21 1039  TempSrc:   PainSc: 2                  Precious Haws [place]

## 2021-07-10 NOTE — Care Management Important Message (Signed)
Important Message  Patient Details  Name: Elgin Carn MRN: [id number] Date of Birth: 10/02/1940   Medicare Important Message Given:  N/A - LOS <3 / Initial given by admissions     Dannette Barbara 07/10/2021, 2:58 PM

## 2021-07-10 NOTE — Transfer of Care (Signed)
Immediate Anesthesia Transfer of Care Note  Patient: Jacob Montes  Procedure(s) Performed: ENDOSCOPIC RETROGRADE CHOLANGIOPANCREATOGRAPHY (ERCP) WITH PROPOFOL  Patient Location: PACU  Anesthesia Type:General  Level of Consciousness: awake, alert  and oriented  Airway & Oxygen Therapy: Patient Spontanous Breathing  Post-op Assessment: Report given to RN and Post -op Vital signs reviewed and stable  Post vital signs: Reviewed and stable  Last Vitals:  Vitals Value Taken Time  BP    Temp    Pulse 66 07/10/21 1023  Resp 7 07/10/21 1023  SpO2 94 % 07/10/21 1023  Vitals shown include unvalidated device data.  Last Pain:  Vitals:   07/10/21 1019  TempSrc: Temporal  PainSc: 0-No pain      Patients Stated Pain Goal: 0 (48/54/62 7035)  Complications: No notable events documented.

## 2021-07-10 NOTE — Progress Notes (Signed)
PROGRESS NOTE  Jacob Montes YYT:[id number] DOB: 11-Sep-1940   PCP: Jerrol Banana., MD  Patient is from: Home.  Lives with his wife.  Independently ambulates at baseline.  DOA: 07/08/2021 LOS: 2  Chief complaints Chief Complaint  Patient presents with   Abdominal Pain   Fever     Brief Narrative / Interim history: 81 year old M with PMH of CAD, AS sp TAVR, HTN, HLD, BPH, GERD and obesity presenting with fever, epigastric and RUQ pain, nausea and vomiting, and admitted for acute cholangitis.  He had elevated LFT.  MRCP showed vague areas of heterogeneous enhancement with concern for possible pseudo lesion, hepatic abscess or mass.  Patient was started on IV Zosyn.  GI consulted.  Patient underwent ERCP with removal of choledocholithiasis and sludge by biliary sphincterotomy and balloon extraction on 6/1.   Subjective: Seen and examined earlier this morning on his way to ERCP.  No major events overnight of this morning.  No complaints.  Objective: Vitals:   07/10/21 1019 07/10/21 1029 07/10/21 1039 07/10/21 1137  BP: (!) 134/49 (!) 130/46 (!) 131/53 (!) 133/42  Pulse: 73 69 74 66  Resp: (!) '21 16 17 18  '$ Temp: (!) 97.1 F (36.2 C)   97.9 F (36.6 C)  TempSrc: Temporal     SpO2: 93% 91% 91% 95%  Weight:      Height:        Examination: GENERAL: No apparent distress.  Nontoxic. HEENT: MMM.  Vision and hearing grossly intact.  NECK: Supple.  No apparent JVD.  RESP:  No IWOB.  Fair aeration bilaterally. CVS:  RRR.  2/6 SEM over LUSB and RUSB. ABD/GI/GU: BS+. Abd soft, NTND.  MSK/EXT:  Moves extremities. No apparent deformity. No edema.  SKIN: no apparent skin lesion or wound NEURO: Awake and alert. Oriented appropriately.  No apparent focal neuro deficit. PSYCH: Calm. Normal affect.   Procedures:  6/1-ERCP with removal of choledocholithiasis and sludge by biliary sphincterotomy and balloon extraction  Microbiology summarized: None  Assessment and Plan: *  Acute cholangitis with choledocholithiasis Patient presents with fever, epigastric/RUQ pain, nausea and vomiting.  Has mild temp to 100.1 on admission.  He has mildly elevated LFT but improving.  Lipase within normal.  MRCP equivocal. -6/1-ERCP with removal of choledocholithiasis and sludge by sphincterotomy and balloon extraction on 6/1. -Continue IV Zosyn. -Recheck labs in the morning  Elevated liver enzymes Likely from possible cholangitis.  Resolving. -Continue monitoring  History of CAD Hold aspirin Continue metoprolol and atorvastatin  Morbid obesity (HCC) BMI 37.28 kg/m2.  Has comorbidities including CAD and HTN -Lifestyle change to lose weight.  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 Stable Continue Flomax  Benign essential hypertension Blood pressure is stable Continue amlodipine and metoprolol  Aortic stenosis S/P TAVR Stable     DVT prophylaxis:  Subcu Lovenox.  Code Status: Full code Family Communication: None at baseline Level of care: Med-Surg.   Status is: Inpatient Remains inpatient appropriate because: Acute cholangitis and choledocholithiasis   Final disposition: Likely home in the next 24 hours. Consultants:  Gastroenterology  Sch Meds:  Scheduled Meds:  amLODipine  10 mg Oral Daily   diclofenac       enoxaparin (LOVENOX) injection  0.5 mg/kg Subcutaneous Q24H   metoprolol tartrate  25 mg Oral BID   multivitamin with minerals  1 tablet Oral Daily   pantoprazole  40 mg Oral Daily   rosuvastatin  20 mg Oral Daily   tamsulosin  0.4 mg Oral Daily   Continuous  Infusions:  sodium chloride 50 mL/hr at 07/10/21 0928   piperacillin-tazobactam (ZOSYN)  IV 3.375 g (07/10/21 0402)   PRN Meds:.montelukast, ondansetron **OR** ondansetron (ZOFRAN) IV  Antimicrobials: Anti-infectives (From admission, onward)    Start     Dose/Rate Route Frequency Ordered Stop   07/08/21 2100  piperacillin-tazobactam (ZOSYN) IVPB 3.375 g        3.375 g 12.5 mL/hr over 240 Minutes  Intravenous Every 8 hours 07/08/21 1540     07/08/21 1800  piperacillin-tazobactam (ZOSYN) IVPB 3.375 g  Status:  Discontinued        3.375 g 100 mL/hr over 30 Minutes Intravenous Every 6 hours 07/08/21 1518 07/08/21 1518   07/08/21 1445  piperacillin-tazobactam (ZOSYN) IVPB 3.375 g        3.375 g 100 mL/hr over 30 Minutes Intravenous  Once 07/08/21 1438 07/08/21 1540        I have personally reviewed the following labs and images: CBC: Recent Labs  Lab 07/04/21 1605 07/08/21 0914 07/09/21 0531 07/10/21 0519  WBC 6.6 11.0* 10.5 10.1  NEUTROABS 3.2  --   --   --   HGB 14.0 13.4 12.5* 12.7*  HCT 40.0 39.4 36.1* 37.4*  MCV 95 95.2 94.0 93.7  PLT 120* 151 158 181   BMP &GFR Recent Labs  Lab 07/04/21 1605 07/08/21 0914 07/09/21 0531 07/10/21 0519  NA 138 136 135 138  K 4.4 3.8 3.9 4.3  CL 102 103 102 105  CO2 '22 25 25 27  '$ GLUCOSE 107* 215* 101* 112*  BUN '14 19 16 16  '$ CREATININE 0.80 0.68 0.60* 0.72  CALCIUM 9.2 9.5 8.7* 8.9  MG  --   --   --  1.8  PHOS  --   --   --  3.5   Estimated Creatinine Clearance: 83.5 mL/min (by C-G formula based on SCr of 0.72 mg/dL). Liver & Pancreas: Recent Labs  Lab 07/04/21 1605 07/08/21 0914 07/10/21 0519  AST 148* 38 36  ALT 251* 80* 54*  ALKPHOS 218* 125 99  BILITOT 2.2* 1.3* 1.4*  PROT 6.5 6.9 6.6  ALBUMIN 4.2 3.5 3.4*   Recent Labs  Lab 07/08/21 0914  LIPASE 17   No results for input(s): AMMONIA in the last 168 hours. Diabetic: No results for input(s): HGBA1C in the last 72 hours. No results for input(s): GLUCAP in the last 168 hours. Cardiac Enzymes: No results for input(s): CKTOTAL, CKMB, CKMBINDEX, TROPONINI in the last 168 hours. No results for input(s): PROBNP in the last 8760 hours. Coagulation Profile: No results for input(s): INR, PROTIME in the last 168 hours. Thyroid Function Tests: No results for input(s): TSH, T4TOTAL, FREET4, T3FREE, THYROIDAB in the last 72 hours. Lipid Profile: No results for  input(s): CHOL, HDL, LDLCALC, TRIG, CHOLHDL, LDLDIRECT in the last 72 hours. Anemia Panel: No results for input(s): VITAMINB12, FOLATE, FERRITIN, TIBC, IRON, RETICCTPCT in the last 72 hours. Urine analysis:    Component Value Date/Time   COLORURINE AMBER (A) 07/08/2021 0914   APPEARANCEUR HAZY (A) 07/08/2021 0914   LABSPEC 1.031 (H) 07/08/2021 0914   PHURINE 5.0 07/08/2021 0914   GLUCOSEU 50 (A) 07/08/2021 0914   HGBUR NEGATIVE 07/08/2021 0914   BILIRUBINUR NEGATIVE 07/08/2021 0914   BILIRUBINUR negative 10/23/2020 [place] 07/08/2021 0914   PROTEINUR NEGATIVE 07/08/2021 0914   UROBILINOGEN 0.2 10/23/2020 1403   NITRITE NEGATIVE 07/08/2021 0914   LEUKOCYTESUR NEGATIVE 07/08/2021 0914   Sepsis Labs: Invalid input(s): PROCALCITONIN, [place]  Microbiology: No  results found for this or any previous visit (from the past 240 hour(s)).  Radiology Studies: DG C-Arm 1-60 Min-No Report  Result Date: 07/10/2021 Fluoroscopy was utilized by the requesting physician.  No radiographic interpretation.      Kindell Strada T. [place]  If 7PM-7AM, please contact night-coverage [email or web address] 07/10/2021, 12:48 PM

## 2021-07-10 NOTE — Anesthesia Preprocedure Evaluation (Signed)
Anesthesia Evaluation  Patient identified by MRN, date of birth, ID band Patient awake    Reviewed: Allergy & Precautions, NPO status , Patient's Chart, lab work & pertinent test results  History of Anesthesia Complications Negative for: history of anesthetic complications  Airway Mallampati: III  TM Distance: <3 FB Neck ROM: full    Dental  (+) Chipped, Poor Dentition, Missing   Pulmonary sleep apnea , former smoker,    Pulmonary exam normal        Cardiovascular Exercise Tolerance: Good hypertension, (-) angina+ CAD  Normal cardiovascular exam+ Valvular Problems/Murmurs      Neuro/Psych CVA negative psych ROS   GI/Hepatic Neg liver ROS, GERD  Controlled,  Endo/Other  negative endocrine ROS  Renal/GU Renal disease     Musculoskeletal   Abdominal   Peds  Hematology negative hematology ROS (+)   Anesthesia Other Findings Past Medical History: No date: Aortic stenosis     Comment:  a. s/p TAVR 03/2017 No date: CAD (coronary artery disease)     Comment:  a. s/p 3-v CABG in 07/2007 No date: Carotid artery disease ([place])     Comment:  a. 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 No date: GERD (gastroesophageal reflux disease) No date: HLD (hyperlipidemia) 10/15/2015: Hyperlipidemia No date: Hypertension 11/16/2018: Nephrolithiasis 08/2010: Pulmonary embolism ([place]) No date: Skin cancer No date: Sleep apnea No date: Stroke [facility])  Past Surgical History: 03/08/2007: Carotid surgery; Left 02/16/2018: CHOLECYSTECTOMY 11/18/2018: ERCP; N/A     Comment:  Procedure: ENDOSCOPIC RETROGRADE               CHOLANGIOPANCREATOGRAPHY (ERCP);  Surgeon: [name],               MD;  Location: [facility] ENDOSCOPY;  Service: Endoscopy;                Laterality: N/A; 07/2007: Heart Bypass; N/A 06/03/2009: left shoulder  surgery; Left 07/18/2007: RIGHT AND LEFT HEART CATH 2003: right shoulder surgery 11/21/2018: TEE WITHOUT CARDIOVERSION; N/A     Comment:  Procedure: TRANSESOPHAGEAL ECHOCARDIOGRAM (TEE);                Surgeon: Wellington Hampshire, MD;  Location: [facility] ORS;                Service: Cardiovascular;  Laterality: N/A; No date: TVAR  BMI    Body Mass Index: 37.28 kg/m      Reproductive/Obstetrics negative OB ROS                             Anesthesia Physical Anesthesia Plan  ASA: 3  Anesthesia Plan: General ETT   Post-op Pain Management:    Induction: Intravenous  PONV Risk Score and Plan: Ondansetron, Dexamethasone, Midazolam and Treatment may vary due to age or medical condition  Airway Management Planned: Oral ETT and Video Laryngoscope Planned  Additional Equipment:   Intra-op Plan:   Post-operative Plan: Extubation in OR  Informed Consent: I have reviewed the patients History and Physical, chart, labs and discussed the procedure including the risks, benefits and alternatives for the proposed anesthesia with the patient or authorized representative who has indicated his/her understanding and acceptance.     Dental Advisory Given  Plan Discussed with: Anesthesiologist, CRNA and Surgeon  Anesthesia Plan Comments: (Patient consented for risks of anesthesia including but not limited to:  -  adverse reactions to medications - damage to eyes, teeth, lips or other oral mucosa - nerve damage due to positioning  - sore throat or hoarseness - Damage to heart, brain, nerves, lungs, other parts of body or loss of life  Patient voiced understanding.)        Anesthesia Quick Evaluation

## 2021-07-10 NOTE — Op Note (Signed)
[facility] Gastroenterology Patient Name: Jacob Montes Procedure Date: 07/10/2021 9:30 AM MRN: [id number] Account #: [id number] Date of Birth: 27-Aug-1940 Admit Type: Inpatient Age: 81 Room: [facility] ENDO ROOM 4 Gender: Male Note Status: Finalized Instrument Name: [id number] Procedure:             ERCP Indications:           Common bile duct stone(s), Suspected ascending                         cholangitis Providers:             [name] MD, MD Referring MD:          Janine Ores. Rosanna Randy, MD (Referring MD) Medicines:             General Anesthesia Complications:         No immediate complications. Procedure:             Pre-Anesthesia Assessment:                        - Prior to the procedure, a History and Physical was                         performed, and patient medications and allergies were                         reviewed. The patient's tolerance of previous                         anesthesia was also reviewed. The risks and benefits                         of the procedure and the sedation options and risks                         were discussed with the patient. All questions were                         answered, and informed consent was obtained. Prior                         Anticoagulants: The patient has taken no previous                         anticoagulant or antiplatelet agents. ASA Grade                         Assessment: II - A patient with mild systemic disease.                         After reviewing the risks and benefits, the patient                         was deemed in satisfactory condition to undergo the                         procedure.  After obtaining informed consent, the scope was passed                         under direct vision. Throughout the procedure, the                         patient's blood pressure, pulse, and oxygen                         saturations were monitored continuously. The                          Duodenoscope was introduced through the mouth, and                         used to inject contrast into and used to cannulate the                         bile duct. The ERCP was accomplished without                         difficulty. The patient tolerated the procedure well. Findings:      A scout film of the abdomen was obtained. Surgical clips, consistent       with a previous cholecystectomy, were seen in the area of the right       upper quadrant of the abdomen. The bile duct was deeply cannulated with       the short-nosed traction sphincterotome. Contrast was injected. I       personally interpreted the bile duct images. There was brisk flow of       contrast through the ducts. Image quality was excellent. Contrast       extended to the entire biliary tree. The lower third of the main bile       duct contained filling defect(s) thought to be a stone and sludge. A       wire was passed into the biliary tree. The biliary sphincterotomy was       extended with a traction (standard) sphincterotome using ERBE       electrocautery. There was no post-sphincterotomy bleeding. To discover       objects, the biliary tree was swept with a 15 mm balloon starting at the       bifurcation. Sludge was swept from the duct. A few stones were removed.       No stones remained. Impression:            - A filling defect consistent with a stone and sludge                         was seen on the cholangiogram.                        - Choledocholithiasis was found. Complete removal was                         accomplished by biliary sphincterotomy and balloon                         extraction.                        -  A biliary sphincterotomy was performed.                        - The biliary tree was swept. Recommendation:        - Resume previous diet.                        - Continue present medications.                        - Watch for pancreatitis, bleeding, perforation, and                          cholangitis. Procedure Code(s):     --- Professional ---                        [id number], Endoscopic retrograde cholangiopancreatography                         (ERCP); with removal of calculi/debris from                         biliary/pancreatic duct(s)                        43262, Endoscopic retrograde cholangiopancreatography                         (ERCP); with sphincterotomy/papillotomy                        [id number], Endoscopic catheterization of the biliary                         ductal system, radiological supervision and                         interpretation Diagnosis Code(s):     --- Professional ---                        K80.50, Calculus of bile duct without cholangitis or                         cholecystitis without obstruction CPT copyright 2019 American Medical Association. All rights reserved. The codes documented in this report are preliminary and upon coder review may  be revised to meet current compliance requirements. [name] MD, MD 07/10/2021 10:10:01 AM This report has been signed electronically. Number of Addenda: 0 Note Initiated On: 07/10/2021 9:30 AM Estimated Blood Loss:  Estimated blood loss: none.      [facility]

## 2021-07-10 NOTE — Anesthesia Procedure Notes (Signed)
Procedure Name: Intubation Date/Time: 07/10/2021 9:36 AM Performed by: Hedda Slade, CRNA Pre-anesthesia Checklist: Patient identified, Patient being monitored, Timeout performed, Emergency Drugs available and Suction available Patient Re-evaluated:Patient Re-evaluated prior to induction Oxygen Delivery Method: Circle system utilized Preoxygenation: Pre-oxygenation with 100% oxygen Induction Type: IV induction and Rapid sequence Laryngoscope Size: Mac and 4 Grade View: Grade I Tube type: Oral Tube size: 7.0 mm Number of attempts: 1 Airway Equipment and Method: Stylet Placement Confirmation: ETT inserted through vocal cords under direct vision, positive ETCO2 and breath sounds checked- equal and bilateral Secured at: 21 cm Tube secured with: Tape Dental Injury: Teeth and Oropharynx as per pre-operative assessment

## 2021-07-11 ENCOUNTER — Encounter: Payer: Self-pay | Admitting: Gastroenterology

## 2021-07-11 DIAGNOSIS — I2510 Atherosclerotic heart disease of native coronary artery without angina pectoris: Secondary | ICD-10-CM | POA: Diagnosis not present

## 2021-07-11 DIAGNOSIS — R748 Abnormal levels of other serum enzymes: Secondary | ICD-10-CM | POA: Diagnosis not present

## 2021-07-11 DIAGNOSIS — K8309 Other cholangitis: Secondary | ICD-10-CM | POA: Diagnosis not present

## 2021-07-11 LAB — HEPATIC FUNCTION PANEL
ALT: 321 U/L — ABNORMAL HIGH (ref 0–44)
ALT: 296 U/L — ABNORMAL HIGH (ref 0–44)
AST: 437 U/L — ABNORMAL HIGH (ref 15–41)
AST: 298 U/L — ABNORMAL HIGH (ref 15–41)
Albumin: 2.9 g/dL — ABNORMAL LOW (ref 3.5–5.0)
Albumin: 3.2 g/dL — ABNORMAL LOW (ref 3.5–5.0)
Alkaline Phosphatase: 251 U/L — ABNORMAL HIGH (ref 38–126)
Alkaline Phosphatase: 291 U/L — ABNORMAL HIGH (ref 38–126)
Bilirubin, Direct: 2.2 mg/dL — ABNORMAL HIGH (ref 0.0–0.2)
Bilirubin, Direct: 0.7 mg/dL — ABNORMAL HIGH (ref 0.0–0.2)
Indirect Bilirubin: 1.1 mg/dL — ABNORMAL HIGH (ref 0.3–0.9)
Indirect Bilirubin: 1 mg/dL — ABNORMAL HIGH (ref 0.3–0.9)
Total Bilirubin: 3.3 mg/dL — ABNORMAL HIGH (ref 0.3–1.2)
Total Bilirubin: 1.7 mg/dL — ABNORMAL HIGH (ref 0.3–1.2)
Total Protein: 5.9 g/dL — ABNORMAL LOW (ref 6.5–8.1)
Total Protein: 6.9 g/dL (ref 6.5–8.1)

## 2021-07-11 LAB — COMPREHENSIVE METABOLIC PANEL
ALT: 322 U/L — ABNORMAL HIGH (ref 0–44)
AST: 476 U/L — ABNORMAL HIGH (ref 15–41)
Albumin: 2.9 g/dL — ABNORMAL LOW (ref 3.5–5.0)
Alkaline Phosphatase: 265 U/L — ABNORMAL HIGH (ref 38–126)
Anion gap: 4 — ABNORMAL LOW (ref 5–15)
BUN: 18 mg/dL (ref 8–23)
CO2: 27 mmol/L (ref 22–32)
Calcium: 8.7 mg/dL — ABNORMAL LOW (ref 8.9–10.3)
Chloride: 107 mmol/L (ref 98–111)
Creatinine, Ser: 0.65 mg/dL (ref 0.61–1.24)
GFR, Estimated: >60 mL/min (ref >60)
Glucose, Bld: 101 mg/dL — ABNORMAL HIGH (ref 70–99)
Potassium: 4.1 mmol/L (ref 3.5–5.1)
Sodium: 138 mmol/L (ref 135–145)
Total Bilirubin: 3.6 mg/dL — ABNORMAL HIGH (ref 0.3–1.2)
Total Protein: 6 g/dL — ABNORMAL LOW (ref 6.5–8.1)

## 2021-07-11 LAB — LIPASE, BLOOD: Lipase: 16 U/L (ref 11–51)

## 2021-07-11 LAB — MAGNESIUM: Magnesium: 2 mg/dL (ref 1.7–2.4)

## 2021-07-11 LAB — CBC
HCT: 34.3 % — ABNORMAL LOW (ref 39.0–52.0)
Hemoglobin: 11.8 g/dL — ABNORMAL LOW (ref 13.0–17.0)
MCH: 32.2 pg (ref 26.0–34.0)
MCHC: 34.4 g/dL (ref 30.0–36.0)
MCV: 93.5 fL (ref 80.0–100.0)
Platelets: 179 10*3/uL (ref 150–400)
RBC: 3.67 MIL/uL — ABNORMAL LOW (ref 4.22–5.81)
RDW: 12.9 % (ref 11.5–15.5)
WBC: 5.5 10*3/uL (ref 4.0–10.5)
nRBC: 0 % (ref 0.0–0.2)

## 2021-07-11 LAB — PHOSPHORUS: Phosphorus: 3.8 mg/dL (ref 2.5–4.6)

## 2021-07-11 MED ORDER — ACETAMINOPHEN 325 MG PO TABS
650.0000 mg | ORAL_TABLET | Freq: Three times a day (TID) | ORAL | Status: DC | PRN
  Administered 2021-07-11 – 2021-07-12 (×2): 650 mg via ORAL
  Filled 2021-07-11 (×2): qty 2

## 2021-07-11 MED ORDER — ASPIRIN 81 MG PO TBEC
81.0000 mg | DELAYED_RELEASE_TABLET | Freq: Every day | ORAL | Status: DC
  Administered 2021-07-11 – 2021-07-12 (×2): 81 mg via ORAL
  Filled 2021-07-11 (×2): qty 1

## 2021-07-11 NOTE — Progress Notes (Signed)
Pt c/o having fever and chills- temp 102.6/ MD made aware

## 2021-07-11 NOTE — Progress Notes (Signed)
PROGRESS NOTE  Jacob Montes LJQ:[id number] DOB: 01/23/1941   PCP: Jerrol Banana., MD  Patient is from: Home.  Lives with his wife.  Independently ambulates at baseline.  DOA: 07/08/2021 LOS: 3  Chief complaints Chief Complaint  Patient presents with   Abdominal Pain   Fever     Brief Narrative / Interim history: 81 year old M with PMH of CAD, AS sp TAVR, HTN, HLD, BPH, GERD and obesity presenting with fever, epigastric and RUQ pain, nausea and vomiting, and admitted for acute cholangitis.  He had elevated LFT.  MRCP showed vague areas of heterogeneous enhancement with concern for possible pseudo lesion, hepatic abscess or mass.  Patient was started on IV Zosyn.  GI consulted.  Patient underwent ERCP with removal of choledocholithiasis and sludge by biliary sphincterotomy and balloon extraction on 6/1.  Has post ERCP acute hepatitis.   Subjective: Seen and examined earlier this morning.  No major events overnight of this morning.  No complaints.  Patient's wife at bedside.  Significant LFT elevation with hyperbilirubinemia and elevated alkaline phosphatase.  Denies GI symptoms.  Objective: Vitals:   07/10/21 1942 07/10/21 2358 07/11/21 0354 07/11/21 0826  BP: (!) 132/43 (!) 133/48 (!) 140/51 (!) 137/50  Pulse: 65 62 72 66  Resp: '20 18 20 18  '$ Temp: 97.6 F (36.4 C) 98.2 F (36.8 C) 98.6 F (37 C) 99.5 F (37.5 C)  TempSrc:  Oral Oral   SpO2: 94% 91% 90% 91%  Weight:      Height:        Examination: GENERAL: No apparent distress.  Nontoxic. HEENT: MMM.  Vision and hearing grossly intact.  NECK: Supple.  No apparent JVD.  RESP:  No IWOB.  Fair aeration bilaterally. CVS:  RRR. Heart sounds normal.  ABD/GI/GU: BS+. Abd soft, NTND.  MSK/EXT:  Moves extremities. No apparent deformity. No edema.  SKIN: no apparent skin lesion or wound NEURO: Awake and alert. Oriented appropriately.  No apparent focal neuro deficit. PSYCH: Calm. Normal affect.   Procedures:   6/1-ERCP with removal of choledocholithiasis and sludge by biliary sphincterotomy and balloon extraction  Microbiology summarized: None  Assessment and Plan: * Acute cholangitis with choledocholithiasis Patient presents with fever, epigastric/RUQ pain, nausea and vomiting.  Has mild temp to 100.1 on admission.  He has mildly elevated LFT but improving.  Lipase within normal.  MRCP equivocal.  Has post ERCP acute hepatitis.  Not symptomatic. -6/1-ERCP with removal of choledocholithiasis and sludge by sphincterotomy and balloon extraction on 6/1. -Hold Crestor and recheck LFT this afternoon -Continue IV Zosyn. -Recheck labs in the morning  Elevated liver enzymes Recent Labs  Lab 07/04/21 1605 07/08/21 0914 07/10/21 0519 07/11/21 0501  AST 148* 38 36 476*  ALT 251* 80* 54* 322*  ALKPHOS 218* 125 99 265*  BILITOT 2.2* 1.3* 1.4* 3.6*  PROT 6.5 6.9 6.6 6.0*  ALBUMIN 4.2 3.5 3.4* 2.9*  Recheck LFT in the afternoon. Discontinue Crestor  History of CAD Continue aspirin and metoprolol Hold statin in the setting of LFT  Morbid obesity (HCC) BMI 37.28 kg/m2.  Has comorbidities including CAD and HTN -Lifestyle change to lose weight.  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 Stable Continue Flomax  Benign essential hypertension Blood pressure is stable Continue amlodipine and metoprolol  Aortic stenosis S/P TAVR Stable     DVT prophylaxis:  Subcu Lovenox.  Code Status: Full code Family Communication: Patient's wife at bedside. Level of care: Med-Surg.   Status is: Inpatient Remains inpatient appropriate because: Acute cholangitis and  choledocholithiasis and elevated liver enzymes   Final disposition: Likely home in the next 24 hours. Consultants:  Gastroenterology  Sch Meds:  Scheduled Meds:  amLODipine  10 mg Oral Daily   aspirin EC  81 mg Oral Daily   enoxaparin (LOVENOX) injection  0.5 mg/kg Subcutaneous Q24H   metoprolol tartrate  25 mg Oral BID   multivitamin with minerals   1 tablet Oral Daily   pantoprazole  40 mg Oral Daily   tamsulosin  0.4 mg Oral Daily   Continuous Infusions:  sodium chloride 50 mL/hr at 07/10/21 0928   piperacillin-tazobactam (ZOSYN)  IV 3.375 g (07/11/21 0356)   PRN Meds:.montelukast, ondansetron **OR** ondansetron (ZOFRAN) IV  Antimicrobials: Anti-infectives (From admission, onward)    Start     Dose/Rate Route Frequency Ordered Stop   07/08/21 2100  piperacillin-tazobactam (ZOSYN) IVPB 3.375 g        3.375 g 12.5 mL/hr over 240 Minutes Intravenous Every 8 hours 07/08/21 1540     07/08/21 1800  piperacillin-tazobactam (ZOSYN) IVPB 3.375 g  Status:  Discontinued        3.375 g 100 mL/hr over 30 Minutes Intravenous Every 6 hours 07/08/21 1518 07/08/21 1518   07/08/21 1445  piperacillin-tazobactam (ZOSYN) IVPB 3.375 g        3.375 g 100 mL/hr over 30 Minutes Intravenous  Once 07/08/21 1438 07/08/21 1540        I have personally reviewed the following labs and images: CBC: Recent Labs  Lab 07/04/21 1605 07/08/21 0914 07/09/21 0531 07/10/21 0519 07/11/21 0501  WBC 6.6 11.0* 10.5 10.1 5.5  NEUTROABS 3.2  --   --   --   --   HGB 14.0 13.4 12.5* 12.7* 11.8*  HCT 40.0 39.4 36.1* 37.4* 34.3*  MCV 95 95.2 94.0 93.7 93.5  PLT 120* 151 158 181 179   BMP &GFR Recent Labs  Lab 07/04/21 1605 07/08/21 0914 07/09/21 0531 07/10/21 0519 07/11/21 0501  NA 138 136 135 138 138  K 4.4 3.8 3.9 4.3 4.1  CL 102 103 102 105 107  CO2 '22 25 25 27 27  '$ GLUCOSE 107* 215* 101* 112* 101*  BUN '14 19 16 16 18  '$ CREATININE 0.80 0.68 0.60* 0.72 0.65  CALCIUM 9.2 9.5 8.7* 8.9 8.7*  MG  --   --   --  1.8 2.0  PHOS  --   --   --  3.5 3.8   Estimated Creatinine Clearance: 83.5 mL/min (by C-G formula based on SCr of 0.65 mg/dL). Liver & Pancreas: Recent Labs  Lab 07/04/21 1605 07/08/21 0914 07/10/21 0519 07/11/21 0501  AST 148* 38 36 476*  ALT 251* 80* 54* 322*  ALKPHOS 218* 125 99 265*  BILITOT 2.2* 1.3* 1.4* 3.6*  PROT 6.5 6.9  6.6 6.0*  ALBUMIN 4.2 3.5 3.4* 2.9*   Recent Labs  Lab 07/08/21 0914 07/11/21 0501  LIPASE 17 16   No results for input(s): AMMONIA in the last 168 hours. Diabetic: No results for input(s): HGBA1C in the last 72 hours. No results for input(s): GLUCAP in the last 168 hours. Cardiac Enzymes: No results for input(s): CKTOTAL, CKMB, CKMBINDEX, TROPONINI in the last 168 hours. No results for input(s): PROBNP in the last 8760 hours. Coagulation Profile: No results for input(s): INR, PROTIME in the last 168 hours. Thyroid Function Tests: No results for input(s): TSH, T4TOTAL, FREET4, T3FREE, THYROIDAB in the last 72 hours. Lipid Profile: No results for input(s): CHOL, HDL, LDLCALC, TRIG, CHOLHDL, LDLDIRECT in  the last 72 hours. Anemia Panel: No results for input(s): VITAMINB12, FOLATE, FERRITIN, TIBC, IRON, RETICCTPCT in the last 72 hours. Urine analysis:    Component Value Date/Time   COLORURINE AMBER (A) 07/08/2021 0914   APPEARANCEUR HAZY (A) 07/08/2021 0914   LABSPEC 1.031 (H) 07/08/2021 0914   PHURINE 5.0 07/08/2021 0914   GLUCOSEU 50 (A) 07/08/2021 0914   HGBUR NEGATIVE 07/08/2021 0914   BILIRUBINUR NEGATIVE 07/08/2021 0914   BILIRUBINUR negative 10/23/2020 [place] 07/08/2021 0914   PROTEINUR NEGATIVE 07/08/2021 0914   UROBILINOGEN 0.2 10/23/2020 1403   NITRITE NEGATIVE 07/08/2021 0914   LEUKOCYTESUR NEGATIVE 07/08/2021 0914   Sepsis Labs: Invalid input(s): PROCALCITONIN, [place]  Microbiology: No results found for this or any previous visit (from the past 240 hour(s)).  Radiology Studies: No results found.    Keiri Solano T. [place]  If 7PM-7AM, please contact night-coverage [email or web address] 07/11/2021, 12:38 PM

## 2021-07-11 NOTE — Progress Notes (Signed)
   Jonathon Bellows , MD [address], [place], [place], [place], [address] [address] [place], [place], [place], [address] Phone: [phone]  Fax: [phone]   Jacob Montes is being followed for cholangitis  Day 3 of follow up   Subjective: Doing well , no nausea/abdomimal pain/fevers/chills or any symptoms, ready to eat   Objective: Vital signs in last 24 hours: Vitals:   07/10/21 1942 07/10/21 2358 07/11/21 0354 07/11/21 0826  BP: (!) 132/43 (!) 133/48 (!) 140/51 (!) 137/50  Pulse: 65 62 72 66  Resp: '20 18 20 18  '$ Temp: 97.6 F (36.4 C) 98.2 F (36.8 C) 98.6 F (37 C) 99.5 F (37.5 C)  TempSrc:  Oral Oral   SpO2: 94% 91% 90% 91%  Weight:      Height:       Weight change:   Intake/Output Summary (Last 24 hours) at 07/11/2021 0935 Last data filed at 07/10/2021 1907 Gross per 24 hour  Intake 1260 ml  Output 0 ml  Net 1260 ml     Exam: Heart:: Regular rate and rhythm, S1S2 present, or without murmur or extra heart sounds Lungs: normal Abdomen: soft, nontender, normal bowel sounds   Lab Results: '@LABTEST2'$ @ Micro Results: No results found for this or any previous visit (from the past 240 hour(s)). Studies/Results: DG C-Arm 1-60 Min-No Report  Result Date: 07/10/2021 Fluoroscopy was utilized by the requesting physician.  No radiographic interpretation.   Medications: I have reviewed the patient's current medications. Scheduled Meds:  amLODipine  10 mg Oral Daily   enoxaparin (LOVENOX) injection  0.5 mg/kg Subcutaneous Q24H   metoprolol tartrate  25 mg Oral BID   multivitamin with minerals  1 tablet Oral Daily   pantoprazole  40 mg Oral Daily   rosuvastatin  20 mg Oral Daily   tamsulosin  0.4 mg Oral Daily   Continuous Infusions:  sodium chloride 50 mL/hr at 07/10/21 0928   piperacillin-tazobactam (ZOSYN)  IV 3.375 g (07/11/21 0356)   PRN Meds:.montelukast, ondansetron **OR** ondansetron (ZOFRAN) IV   Assessment: Principal Problem:   Acute  cholangitis with choledocholithiasis Active Problems:   Aortic stenosis   Benign essential hypertension   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   History of CAD   Morbid obesity (HCC)   Elevated liver enzymes   Choledocholithiasis  Jacob Montes 81 y.o. male with recent history of abdominal pain fever treated with doxycycline.  Significantly elevated liver function test on Jul 03, 2021.  Ultrasound shows no gross abnormalities.  History of cholecystectomy and sphincterotomy for choledocholithiasis in 2020.  MRCP suggestive of choledocholithiasis and concerns for liver abscesses.  Rx as cholangitis.s/p extraction of CBD stones   Plan 1.  LFT's up today , would expect to start going down soon . Monitor   2. Outpatient EUS to r/o GIST tumor seen on MRCP  3. Advance diet - home tomorrow if Lft's start coming down        LOS: 3 days   Jonathon Bellows, MD 07/11/2021, 9:35 AM

## 2021-07-12 DIAGNOSIS — K8309 Other cholangitis: Secondary | ICD-10-CM | POA: Diagnosis not present

## 2021-07-12 DIAGNOSIS — I2510 Atherosclerotic heart disease of native coronary artery without angina pectoris: Secondary | ICD-10-CM | POA: Diagnosis not present

## 2021-07-12 DIAGNOSIS — R748 Abnormal levels of other serum enzymes: Secondary | ICD-10-CM | POA: Diagnosis not present

## 2021-07-12 LAB — COMPREHENSIVE METABOLIC PANEL
ALT: 217 U/L — ABNORMAL HIGH (ref 0–44)
AST: 173 U/L — ABNORMAL HIGH (ref 15–41)
Albumin: 3 g/dL — ABNORMAL LOW (ref 3.5–5.0)
Alkaline Phosphatase: 240 U/L — ABNORMAL HIGH (ref 38–126)
Anion gap: 5 (ref 5–15)
BUN: 15 mg/dL (ref 8–23)
CO2: 27 mmol/L (ref 22–32)
Calcium: 8.9 mg/dL (ref 8.9–10.3)
Chloride: 105 mmol/L (ref 98–111)
Creatinine, Ser: 0.83 mg/dL (ref 0.61–1.24)
GFR, Estimated: >60 mL/min (ref >60)
Glucose, Bld: 101 mg/dL — ABNORMAL HIGH (ref 70–99)
Potassium: 4.2 mmol/L (ref 3.5–5.1)
Sodium: 137 mmol/L (ref 135–145)
Total Bilirubin: 1.4 mg/dL — ABNORMAL HIGH (ref 0.3–1.2)
Total Protein: 6.3 g/dL — ABNORMAL LOW (ref 6.5–8.1)

## 2021-07-12 LAB — MAGNESIUM: Magnesium: 2.1 mg/dL (ref 1.7–2.4)

## 2021-07-12 LAB — PHOSPHORUS: Phosphorus: 3.1 mg/dL (ref 2.5–4.6)

## 2021-07-12 LAB — LIPASE, BLOOD: Lipase: 16 U/L (ref 11–51)

## 2021-07-12 MED ORDER — ROSUVASTATIN CALCIUM 20 MG PO TABS: 20.0000 mg | ORAL_TABLET | Freq: Every day | ORAL | 0 refills | Status: DC

## 2021-07-12 MED ORDER — AMOXICILLIN-POT CLAVULANATE 875-125 MG PO TABS: 1.0000 | ORAL_TABLET | Freq: Two times a day (BID) | ORAL | 0 refills | Status: DC

## 2021-07-12 NOTE — Discharge Summary (Signed)
Physician Discharge Summary  Jacob Montes PJA:[id number] DOB: 1940/04/20 DOA: 07/08/2021  PCP: Jerrol Banana., MD  Admit date: 07/08/2021 Discharge date: 07/12/2021 Admitted From: Home Disposition: Home Recommendations for Outpatient Follow-up:  Follow ups as below. Please obtain CBC/CMP/Mag at follow up Please follow up on the following pending results: None  Home Health: Not indicated Equipment/Devices: Not indicated  Discharge Condition: Stable CODE STATUS: Full code  Follow-up Information     Jerrol Banana., MD. Schedule an appointment as soon as possible for a visit in 1 week(s).   Specialty: Family Medicine Contact information: [address] [place] [address] [phone]                 Hospital course 81 year old M with PMH of CAD, AS sp TAVR, HTN, HLD, BPH, GERD and obesity presenting with fever, epigastric and RUQ pain, nausea and vomiting, and admitted for acute cholangitis.  He had elevated LFT.  MRCP showed vague areas of heterogeneous enhancement with concern for possible pseudo lesion, hepatic abscess or mass.  Patient was started on IV Zosyn.  GI consulted.  Patient underwent ERCP with removal of choledocholithiasis and sludge by biliary sphincterotomy and balloon extraction on 6/1.  Has post ERCP acute hepatitis and isolated fever the next day.  Blood cultures negative.  Acute hepatitis improved.   On the day of discharge, fever resolved.  Liver enzymes improved.  He is discharged on p.o. Augmentin for 8 more days.  Follow-up with PCP in 1 to 2 weeks.   See individual problem list below for more on hospital course.  Problems addressed during this hospitalization Problem  Acute cholangitis with choledocholithiasis  Elevated Liver Enzymes  History of CAD  Morbid Obesity (Hcc)    Assessment and Plan: * Acute cholangitis with choledocholithiasis Status post ERCP, sphincterotomy and stone removal.  Received IV Zosyn in  house.  Large on p.o. Augmentin for 8 more days Advised to hold Crestor until LFT back to normal  Elevated liver enzymes Recent Labs  Lab 07/08/21 0914 07/10/21 0519 07/11/21 0501 07/11/21 1551 07/12/21 0318  AST 38 36 437*  476* 298* 173*  ALT 80* 54* 321*  322* 296* 217*  ALKPHOS 125 99 251*  265* 291* 240*  BILITOT 1.3* 1.4* 3.3*  3.6* 1.7* 1.4*  PROT 6.9 6.6 5.9*  6.0* 6.9 6.3*  ALBUMIN 3.5 3.4* 2.9*  2.9* 3.2* 3.0*  Recheck LFT at follow-up. Advised to hold Crestor until recheck  History of CAD Continue aspirin and metoprolol Hold statin in the setting of LFT  Morbid obesity (HCC) BMI 37.28 kg/m2.  Has comorbidities including CAD and HTN -Lifestyle change to lose weight.  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  Nontoxic. HEENT: MMM.  Vision and hearing grossly intact.  NECK: Supple.  No apparent JVD.  RESP:  No IWOB.  Fair aeration bilaterally. CVS:  RRR. Heart sounds normal.  ABD/GI/GU: BS+. Abd soft, NTND.  MSK/EXT:  Moves extremities. No apparent deformity. No edema.  SKIN: no apparent skin lesion or wound NEURO: Awake and alert. Oriented appropriately.  No apparent focal neuro deficit. PSYCH: Calm. Normal affect.   Discharge Instructions Discharge Instructions     Call MD for:  difficulty breathing, headache or visual disturbances   Complete by: As directed    Call MD for:  extreme fatigue   Complete by: As directed    Call MD for:  persistant dizziness or  light-headedness   Complete by: As directed    Call MD for:  persistant nausea and vomiting   Complete by: As directed    Call MD for:  severe uncontrolled pain   Complete by: As directed    Call MD for:  temperature >100.4   Complete by: As directed    Diet - low sodium heart healthy   Complete by: As directed    Discharge instructions   Complete by: As directed    It has been a pleasure taking care of you!  You were hospitalized due to biliary duct stone blockage and infection for which you have been treated surgically medically.  Your symptoms and liver enzymes improved to the point we think it is safe to let you go home and follow-up with your doctors.  We are discharging you on more antibiotics to complete treatment course.  Please review your new medication list and the directions on your medications before you take them.  Follow-up with gastroenterology as per the recommendation.  Follow-up with your primary care doctor in 1 to 2 weeks.  We recommend holding your Crestor for about a week until your liver enzymes are back to normal.   Take care,   Increase activity slowly   Complete by: As directed       Allergies as of 07/12/2021       Reactions   Atorvastatin Other (See Comments)   aches   Ramipril Other (See Comments)   cough        Medication List     STOP taking these medications    doxycycline 100 MG tablet Commonly known as: VIBRA-TABS   Vitamins/Minerals Tabs       TAKE these medications    amLODipine 10 MG tablet Commonly known as: NORVASC Take 1 tablet by mouth once daily   amoxicillin-clavulanate 875-125 MG tablet Commonly known as: AUGMENTIN Take 1 tablet by mouth 2 (two) times daily for 8 days.   aspirin 81 MG chewable tablet Chew 81 mg by mouth daily.   docusate sodium 250 MG capsule Commonly known as: COLACE Take 250 mg by mouth daily as needed for constipation.   furosemide 40 MG tablet Commonly known as: LASIX Take 1 tablet (40 mg  total) by mouth 3 (three) times a week. What changed: additional instructions   metoprolol tartrate 25 MG tablet Commonly known as: LOPRESSOR Take 1 tablet (25 mg total) by mouth 2 (two) times daily.   montelukast 10 MG tablet Commonly known as: SINGULAIR Take 1 tablet (10 mg total) by mouth at bedtime as needed (allergies).   multivitamin tablet Take 1 tablet by mouth daily.   omeprazole 20 MG capsule Commonly known as: PRILOSEC Take 20 mg by mouth daily. As needed   potassium chloride 10 MEQ tablet Commonly known as: KLOR-CON Take 1 tablet (10 mEq total) by mouth 3 (three) times a week. Take with lasix  What changed: additional instructions   rosuvastatin 20 MG tablet Commonly known as: CRESTOR Take 1 tablet (20 mg total) by mouth daily. Start taking on: July 19, 2021 What changed: These instructions start on July 19, 2021. If you are unsure what to do until then, ask your doctor or other care provider.   tamsulosin 0.4 MG Caps capsule Commonly known as: FLOMAX Take 1 capsule by mouth once daily        Consultations: Gastroenterology  Procedures/Studies: ERCP with sphincterectomy and stone removal on 6/1.   DG Abd 1 View  Result Date: 07/04/2021 CLINICAL DATA:  Abdominal pain and distention EXAM: ABDOMEN - 1 VIEW COMPARISON:  None Available. FINDINGS: Bowel gas pattern is nonspecific. Small amount of stool is seen in the colon. There is no fecal impaction in the rectosigmoid. Surgical clips are seen in the right upper quadrant. No abnormal masses or calcifications are seen. Degenerative changes are noted in the lumbar spine. IMPRESSION: Nonspecific bowel gas pattern. Small stool burden in the colon without signs of fecal impaction in the rectosigmoid. Electronically Signed   By: Elmer Picker M.D.   On: 07/04/2021 20:37   MR 3D Recon At Scanner  Result Date: 07/08/2021 CLINICAL DATA:  Fever since last Tuesday with upper abdominal pain and elevated bilirubin  with negative ultrasound. EXAM: MRI ABDOMEN WITHOUT AND WITH CONTRAST (INCLUDING MRCP) TECHNIQUE: Multiplanar multisequence MR imaging of the abdomen was performed both before and after the administration of intravenous contrast. Heavily T2-weighted images of the biliary and pancreatic ducts were obtained, and three-dimensional MRCP images were rendered by post processing. CONTRAST:  107m GADAVIST GADOBUTROL 1 MMOL/ML IV SOLN COMPARISON:  Ultrasound of Jul 05, 2021 FINDINGS: Lower chest: No effusion or consolidation at the lung bases. Hepatobiliary: No substantial biliary duct dilation but with filling defect in the distal common bile duct that may reflect a group of small stones or coalescent sludgew. (Image 52/14) up to 8 mm greatest craniocaudal extent and approximately 4 x 3 mm in the axial plane but again without substantial biliary duct dilation following cholecystectomy. Vague areas of T1 hypointensity and T2 hypointensity near the cephalad RIGHT and LEFT hemiliver with corresponding heterogeneity of enhancement, slightly more pronounced arterial phase enhancement. The portal vein is patent. Areas of heterogeneity show a persistent enhancement across multiple phases with perhaps mild fading pattern and without defined margins, largest area measuring approximately 3.8 x 1.9 cm (image 20/23) Vague areas also in the LEFT hepatic lobe are less numerous. Pancreas: Mild pancreatic atrophy. No ductal dilation or sign of inflammation. Intrinsic T1 signal with mild preservation. Spleen:  Normal size and contour without visible lesion. Adrenals/Urinary Tract: No suspicious renal lesion, hydronephrosis or perinephric stranding with normal appearance of the bilateral adrenal glands. Stomach/Bowel: No acute findings to the extent evaluated on this abdominal MRI. There is a hyperenhancing lesion associated with the greater curvature of the stomach near the fundus (image 24/25) 11 mm. Enhancement characteristics are similar  to that of adjacent spleen and the lesion is relatively homogeneous. Assessment of previous imaging shows this is not changed since October of 2022 and in fact is stable dating back to October of 2020. Vascular/Lymphatic: Collateral pathways in the LEFT retroperitoneum are stable and not fully assessed. No adenopathy in the abdomen or in the pelvis. Other:  No ascites. Musculoskeletal: Schmorl's nodes and vertebral hemangiomata not changed since prior CT imaging. IMPRESSION: 1. Vague areas of heterogeneous enhancement, ill-defined on all submitted sequences raising the question of inflammatory pseudolesions in  the liver potentially related to underlying cholangitis in the setting of suspected coalescent sludge and or collection of small stones in the distal common bile duct. No focal fluid though areas could represent early hepatic abscess. No current frank evidence of obstruction. Given that these areas have a near masslike appearance would suggest delayed imaging in 6-8 weeks outside of the acute setting following therapy with MRI utilizing Eovist contrast to exclude worsening or underlying neoplasm. 2. 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 Heat damaged red blood cell study may be helpful for further evaluation. This represents a benign or indolent process based on behavior as it is not changed since October of 2020. Electronically Signed   By: Zetta Bills M.D.   On: 07/08/2021 14:04   DG C-Arm 1-60 Min-No Report  Result Date: 07/10/2021 Fluoroscopy was utilized by the requesting physician.  No radiographic interpretation.   MR ABDOMEN MRCP W WO CONTAST  Result Date: 07/08/2021 CLINICAL DATA:  Fever since last Tuesday with upper abdominal pain and elevated bilirubin with negative ultrasound. EXAM: MRI  ABDOMEN WITHOUT AND WITH CONTRAST (INCLUDING MRCP) TECHNIQUE: Multiplanar multisequence MR imaging of the abdomen was performed both before and after the administration of intravenous contrast. Heavily T2-weighted images of the biliary and pancreatic ducts were obtained, and three-dimensional MRCP images were rendered by post processing. CONTRAST:  25m GADAVIST GADOBUTROL 1 MMOL/ML IV SOLN COMPARISON:  Ultrasound of Jul 05, 2021 FINDINGS: Lower chest: No effusion or consolidation at the lung bases. Hepatobiliary: No substantial biliary duct dilation but with filling defect in the distal common bile duct that may reflect a group of small stones or coalescent sludgew. (Image 52/14) up to 8 mm greatest craniocaudal extent and approximately 4 x 3 mm in the axial plane but again without substantial biliary duct dilation following cholecystectomy. Vague areas of T1 hypointensity and T2 hypointensity near the cephalad RIGHT and LEFT hemiliver with corresponding heterogeneity of enhancement, slightly more pronounced arterial phase enhancement. The portal vein is patent. Areas of heterogeneity show a persistent enhancement across multiple phases with perhaps mild fading pattern and without defined margins, largest area measuring approximately 3.8 x 1.9 cm (image 20/23) Vague areas also in the LEFT hepatic lobe are less numerous. Pancreas: Mild pancreatic atrophy. No ductal dilation or sign of inflammation. Intrinsic T1 signal with mild preservation. Spleen:  Normal size and contour without visible lesion. Adrenals/Urinary Tract: No suspicious renal lesion, hydronephrosis or perinephric stranding with normal appearance of the bilateral adrenal glands. Stomach/Bowel: No acute findings to the extent evaluated on this abdominal MRI. There is a hyperenhancing lesion associated with the greater curvature of the stomach near the fundus (image 24/25) 11 mm. Enhancement characteristics are similar to that of adjacent spleen and the  lesion is relatively homogeneous. Assessment of previous imaging shows this is not changed since October of 2022 and in fact is stable dating back to October of 2020. Vascular/Lymphatic: Collateral pathways in the LEFT retroperitoneum are stable and not fully assessed. No adenopathy in the abdomen or in the pelvis. Other:  No ascites. Musculoskeletal: Schmorl's nodes and vertebral hemangiomata not changed since prior CT imaging. IMPRESSION: 1. Vague areas of heterogeneous enhancement, ill-defined on all submitted sequences raising the question of inflammatory pseudolesions in the liver potentially related to underlying cholangitis in the setting of suspected coalescent sludge and or collection of  small stones in the distal common bile duct. No focal fluid though areas could represent early hepatic abscess. No current frank evidence of obstruction. Given that these areas have a near masslike appearance would suggest delayed imaging in 6-8 weeks outside of the acute setting following therapy with MRI utilizing Eovist contrast to exclude worsening or underlying neoplasm. 2. 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 Heat damaged red blood cell study may be helpful for further evaluation. This represents a benign or indolent process based on behavior as it is not changed since October of 2020. Electronically Signed   By: Zetta Bills M.D.   On: 07/08/2021 14:04   US Abdomen Limited RUQ (LIVER/GB)  Result Date: 07/05/2021 CLINICAL DATA:  Post prandial abdominal pain and bloating this week. History of a cholecystectomy. EXAM: ULTRASOUND ABDOMEN LIMITED RIGHT UPPER QUADRANT COMPARISON:  CT abdomen pelvis, 11/15/2020. FINDINGS: Gallbladder: Surgically absent. Common bile duct: Diameter: 5 mm.  Not visualized distally. Liver:  No focal lesion identified. Within normal limits in parenchymal echogenicity. Portal vein is patent on color Doppler imaging with normal direction of blood flow towards the liver. Other: None. IMPRESSION: 1. No acute findings.  No bile duct dilation. 2. Status post cholecystectomy. Electronically Signed   By: Lajean Manes M.D.   On: 07/05/2021 12:25       The results of significant diagnostics from this hospitalization (including imaging, microbiology, ancillary and laboratory) are listed below for reference.     Microbiology: Recent Results (from the past 240 hour(s))  Culture, blood (Routine X 2) w Reflex to ID Panel     Status: None (Preliminary result)   Collection Time: 07/11/21  3:51 PM   Specimen: BLOOD  Result Value Ref Range Status   Specimen Description BLOOD BLOOD LEFT HAND  Final   Special Requests   Final    BOTTLES DRAWN AEROBIC AND ANAEROBIC Blood Culture adequate volume   Culture   Final    NO GROWTH < 24 HOURS Performed at [facility], [address] [place], [place] [address]    Report Status PENDING  Incomplete  Culture, blood (Routine X 2) w Reflex to ID Panel     Status: None (Preliminary result)   Collection Time: 07/11/21  4:04 PM   Specimen: BLOOD  Result Value Ref Range Status   Specimen Description BLOOD BLOOD RIGHT HAND  Final   Special Requests   Final    BOTTLES DRAWN AEROBIC AND ANAEROBIC Blood Culture adequate volume   Culture   Final    NO GROWTH < 24 HOURS Performed at [facility], [address] [place], [place] [address]    Report Status PENDING  Incomplete     Labs:  CBC: Recent Labs  Lab 07/08/21 0914 07/09/21 0531 07/10/21 0519 07/11/21 0501  WBC 11.0* 10.5 10.1 5.5  HGB 13.4 12.5* 12.7* 11.8*  HCT 39.4 36.1* 37.4* 34.3*  MCV 95.2 94.0 93.7 93.5  PLT 151 158 181 179   BMP &GFR Recent Labs  Lab 07/08/21 0914 07/09/21 0531 07/10/21 0519 07/11/21 0501 07/12/21 0318  NA 136 135 138 138 137  K 3.8  3.9 4.3 4.1 4.2  CL 103 102 105 107 105  CO2 '25 25 27 27 27  '$ GLUCOSE 215* 101* 112* 101* 101*  BUN '19 16 16 18 15  '$ CREATININE 0.68  0.60* 0.72 0.65 0.83  CALCIUM 9.5 8.7* 8.9 8.7* 8.9  MG  --   --  1.8 2.0 2.1  PHOS  --   --  3.5 3.8 3.1   Estimated Creatinine Clearance: 80.5 mL/min (by C-G formula based on SCr of 0.83 mg/dL). Liver & Pancreas: Recent Labs  Lab 07/08/21 0914 07/10/21 0519 07/11/21 0501 07/11/21 1551 07/12/21 0318  AST 38 36 437*  476* 298* 173*  ALT 80* 54* 321*  322* 296* 217*  ALKPHOS 125 99 251*  265* 291* 240*  BILITOT 1.3* 1.4* 3.3*  3.6* 1.7* 1.4*  PROT 6.9 6.6 5.9*  6.0* 6.9 6.3*  ALBUMIN 3.5 3.4* 2.9*  2.9* 3.2* 3.0*   Recent Labs  Lab 07/08/21 0914 07/11/21 0501 07/12/21 0318  LIPASE '17 16 16   '$ No results for input(s): AMMONIA in the last 168 hours. Diabetic: No results for input(s): HGBA1C in the last 72 hours. No results for input(s): GLUCAP in the last 168 hours. Cardiac Enzymes: No results for input(s): CKTOTAL, CKMB, CKMBINDEX, TROPONINI in the last 168 hours. No results for input(s): PROBNP in the last 8760 hours. Coagulation Profile: No results for input(s): INR, PROTIME in the last 168 hours. Thyroid Function Tests: No results for input(s): TSH, T4TOTAL, FREET4, T3FREE, THYROIDAB in the last 72 hours. Lipid Profile: No results for input(s): CHOL, HDL, LDLCALC, TRIG, CHOLHDL, LDLDIRECT in the last 72 hours. Anemia Panel: No results for input(s): VITAMINB12, FOLATE, FERRITIN, TIBC, IRON, RETICCTPCT in the last 72 hours. Urine analysis:    Component Value Date/Time   COLORURINE AMBER (A) 07/08/2021 0914   APPEARANCEUR HAZY (A) 07/08/2021 0914   LABSPEC 1.031 (H) 07/08/2021 0914   PHURINE 5.0 07/08/2021 0914   GLUCOSEU 50 (A) 07/08/2021 0914   HGBUR NEGATIVE 07/08/2021 0914   BILIRUBINUR NEGATIVE 07/08/2021 0914   BILIRUBINUR negative 10/23/2020 [place] 07/08/2021 0914   PROTEINUR NEGATIVE 07/08/2021 0914    UROBILINOGEN 0.2 10/23/2020 1403   NITRITE NEGATIVE 07/08/2021 0914   LEUKOCYTESUR NEGATIVE 07/08/2021 0914   Sepsis Labs: Invalid input(s): PROCALCITONIN, LACTICIDVEN   Time coordinating discharge: 45 minutes  SIGNED:  Mercy Riding, MD  [facility] 07/12/2021, 3:36 PM

## 2021-07-12 NOTE — Progress Notes (Signed)
Jacob Montes to be D/C'd Home per MD order.  Discussed prescriptions and follow up appointments with the patient. Prescription electronically submitted, medication list explained in detail. Pt verbalized understanding.  Allergies as of 07/12/2021       Reactions   Atorvastatin Other (See Comments)   aches   Ramipril Other (See Comments)   cough        Medication List     STOP taking these medications    doxycycline 100 MG tablet Commonly known as: VIBRA-TABS   Vitamins/Minerals Tabs       TAKE these medications    amLODipine 10 MG tablet Commonly known as: NORVASC Take 1 tablet by mouth once daily   amoxicillin-clavulanate 875-125 MG tablet Commonly known as: AUGMENTIN Take 1 tablet by mouth 2 (two) times daily for 8 days.   aspirin 81 MG chewable tablet Chew 81 mg by mouth daily.   docusate sodium 250 MG capsule Commonly known as: COLACE Take 250 mg by mouth daily as needed for constipation.   furosemide 40 MG tablet Commonly known as: LASIX Take 1 tablet (40 mg total) by mouth 3 (three) times a week. What changed: additional instructions   metoprolol tartrate 25 MG tablet Commonly known as: LOPRESSOR Take 1 tablet (25 mg total) by mouth 2 (two) times daily.   montelukast 10 MG tablet Commonly known as: SINGULAIR Take 1 tablet (10 mg total) by mouth at bedtime as needed (allergies).   multivitamin tablet Take 1 tablet by mouth daily.   omeprazole 20 MG capsule Commonly known as: PRILOSEC Take 20 mg by mouth daily. As needed   potassium chloride 10 MEQ tablet Commonly known as: KLOR-CON Take 1 tablet (10 mEq total) by mouth 3 (three) times a week. Take with lasix What changed: additional instructions   rosuvastatin 20 MG tablet Commonly known as: CRESTOR Take 1 tablet (20 mg total) by mouth daily. Start taking on: July 19, 2021 What changed: These instructions start on July 19, 2021. If you are unsure what to do until then, ask your doctor or other  care provider.   tamsulosin 0.4 MG Caps capsule Commonly known as: FLOMAX Take 1 capsule by mouth once daily        Vitals:   07/12/21 0804 07/12/21 0909  BP:    Pulse:    Resp:    Temp: 99.6 F (37.6 C) 99.7 F (37.6 C)  SpO2:      Skin clean, dry and intact without evidence of skin break down, no evidence of skin tears noted. IV catheter discontinued intact. Site without signs and symptoms of complications. Dressing and pressure applied. Pt denies pain at this time. No complaints noted.  An After Visit Summary was printed and given to the patient. Patient escorted via [place], and D/C home via private auto.  Jacob Montes

## 2021-07-13 ENCOUNTER — Emergency Department: Payer: Medicare Other

## 2021-07-13 ENCOUNTER — Inpatient Hospital Stay
Admission: EM | Admit: 2021-07-13 | Discharge: 2021-07-22 | DRG: 445 | Disposition: A | Discharge status: Home Health | Payer: Medicare Other | Attending: Internal Medicine | Admitting: Internal Medicine

## 2021-07-13 ENCOUNTER — Other Ambulatory Visit: Payer: Self-pay

## 2021-07-13 DIAGNOSIS — M7989 Other specified soft tissue disorders: Secondary | ICD-10-CM | POA: Diagnosis not present

## 2021-07-13 DIAGNOSIS — G4730 Sleep apnea, unspecified: Secondary | ICD-10-CM | POA: Diagnosis present

## 2021-07-13 DIAGNOSIS — Z87891 Personal history of nicotine dependence: Secondary | ICD-10-CM

## 2021-07-13 DIAGNOSIS — Z8249 Family history of ischemic heart disease and other diseases of the circulatory system: Secondary | ICD-10-CM

## 2021-07-13 DIAGNOSIS — Z9049 Acquired absence of other specified parts of digestive tract: Secondary | ICD-10-CM

## 2021-07-13 DIAGNOSIS — K7469 Other cirrhosis of liver: Secondary | ICD-10-CM | POA: Diagnosis not present

## 2021-07-13 DIAGNOSIS — Z888 Allergy status to other drugs, medicaments and biological substances status: Secondary | ICD-10-CM | POA: Diagnosis not present

## 2021-07-13 DIAGNOSIS — Z952 Presence of prosthetic heart valve: Secondary | ICD-10-CM

## 2021-07-13 DIAGNOSIS — Z86711 Personal history of pulmonary embolism: Secondary | ICD-10-CM

## 2021-07-13 DIAGNOSIS — M159 Polyosteoarthritis, unspecified: Secondary | ICD-10-CM | POA: Diagnosis not present

## 2021-07-13 DIAGNOSIS — N400 Enlarged prostate without lower urinary tract symptoms: Secondary | ICD-10-CM | POA: Diagnosis present

## 2021-07-13 DIAGNOSIS — K7460 Unspecified cirrhosis of liver: Secondary | ICD-10-CM | POA: Diagnosis present

## 2021-07-13 DIAGNOSIS — Z951 Presence of aortocoronary bypass graft: Secondary | ICD-10-CM | POA: Diagnosis not present

## 2021-07-13 DIAGNOSIS — K769 Liver disease, unspecified: Secondary | ICD-10-CM | POA: Diagnosis not present

## 2021-07-13 DIAGNOSIS — R748 Abnormal levels of other serum enzymes: Secondary | ICD-10-CM | POA: Diagnosis not present

## 2021-07-13 DIAGNOSIS — K219 Gastro-esophageal reflux disease without esophagitis: Secondary | ICD-10-CM | POA: Diagnosis not present

## 2021-07-13 DIAGNOSIS — Z86718 Personal history of other venous thrombosis and embolism: Secondary | ICD-10-CM | POA: Diagnosis not present

## 2021-07-13 DIAGNOSIS — Z85828 Personal history of other malignant neoplasm of skin: Secondary | ICD-10-CM | POA: Diagnosis not present

## 2021-07-13 DIAGNOSIS — K766 Portal hypertension: Secondary | ICD-10-CM | POA: Diagnosis not present

## 2021-07-13 DIAGNOSIS — R509 Fever, unspecified: Secondary | ICD-10-CM | POA: Diagnosis present

## 2021-07-13 DIAGNOSIS — M7121 Synovial cyst of popliteal space [Baker], right knee: Secondary | ICD-10-CM | POA: Diagnosis not present

## 2021-07-13 DIAGNOSIS — Z79899 Other long term (current) drug therapy: Secondary | ICD-10-CM

## 2021-07-13 DIAGNOSIS — I2510 Atherosclerotic heart disease of native coronary artery without angina pectoris: Secondary | ICD-10-CM | POA: Diagnosis not present

## 2021-07-13 DIAGNOSIS — Z8673 Personal history of transient ischemic attack (TIA), and cerebral infarction without residual deficits: Secondary | ICD-10-CM

## 2021-07-13 DIAGNOSIS — K750 Abscess of liver: Secondary | ICD-10-CM | POA: Diagnosis present

## 2021-07-13 DIAGNOSIS — Z7982 Long term (current) use of aspirin: Secondary | ICD-10-CM | POA: Diagnosis not present

## 2021-07-13 DIAGNOSIS — E785 Hyperlipidemia, unspecified: Secondary | ICD-10-CM | POA: Diagnosis not present

## 2021-07-13 DIAGNOSIS — K8309 Other cholangitis: Secondary | ICD-10-CM | POA: Diagnosis not present

## 2021-07-13 DIAGNOSIS — Z6837 Body mass index (BMI) 37.0-37.9, adult: Secondary | ICD-10-CM | POA: Diagnosis not present

## 2021-07-13 DIAGNOSIS — K5730 Diverticulosis of large intestine without perforation or abscess without bleeding: Secondary | ICD-10-CM | POA: Diagnosis not present

## 2021-07-13 DIAGNOSIS — K4020 Bilateral inguinal hernia, without obstruction or gangrene, not specified as recurrent: Secondary | ICD-10-CM | POA: Diagnosis not present

## 2021-07-13 DIAGNOSIS — I10 Essential (primary) hypertension: Secondary | ICD-10-CM | POA: Diagnosis not present

## 2021-07-13 DIAGNOSIS — N189 Chronic kidney disease, unspecified: Secondary | ICD-10-CM | POA: Diagnosis present

## 2021-07-13 LAB — CBC WITH DIFFERENTIAL/PLATELET
Abs Immature Granulocytes: 0.03 10*3/uL (ref 0.00–0.07)
Basophils Absolute: 0 10*3/uL (ref 0.0–0.1)
Basophils Relative: 0 %
Eosinophils Absolute: 0 10*3/uL (ref 0.0–0.5)
Eosinophils Relative: 0 %
HCT: 35.3 % — ABNORMAL LOW (ref 39.0–52.0)
Hemoglobin: 12.7 g/dL — ABNORMAL LOW (ref 13.0–17.0)
Immature Granulocytes: 0 %
Lymphocytes Relative: 14 %
Lymphs Abs: 1.6 10*3/uL (ref 0.7–4.0)
MCH: 34 pg (ref 26.0–34.0)
MCHC: 36 g/dL (ref 30.0–36.0)
MCV: 94.6 fL (ref 80.0–100.0)
Monocytes Absolute: 1.2 10*3/uL — ABNORMAL HIGH (ref 0.1–1.0)
Monocytes Relative: 10 %
Neutro Abs: 8.4 10*3/uL — ABNORMAL HIGH (ref 1.7–7.7)
Neutrophils Relative %: 76 %
Platelets: 246 10*3/uL (ref 150–400)
RBC: 3.73 MIL/uL — ABNORMAL LOW (ref 4.22–5.81)
RDW: 13.1 % (ref 11.5–15.5)
WBC: 11.2 10*3/uL — ABNORMAL HIGH (ref 4.0–10.5)
nRBC: 0 % (ref 0.0–0.2)

## 2021-07-13 LAB — URINALYSIS, ROUTINE W REFLEX MICROSCOPIC
Bilirubin Urine: NEGATIVE
Glucose, UA: NEGATIVE mg/dL
Hgb urine dipstick: NEGATIVE
Ketones, ur: NEGATIVE mg/dL
Leukocytes,Ua: NEGATIVE
Nitrite: NEGATIVE
Protein, ur: NEGATIVE mg/dL
Specific Gravity, Urine: 1.012 (ref 1.005–1.030)
pH: 5 (ref 5.0–8.0)

## 2021-07-13 LAB — LACTIC ACID, PLASMA: Lactic Acid, Venous: 1.7 mmol/L (ref 0.5–1.9)

## 2021-07-13 LAB — COMPREHENSIVE METABOLIC PANEL
ALT: 127 U/L — ABNORMAL HIGH (ref 0–44)
AST: 65 U/L — ABNORMAL HIGH (ref 15–41)
Albumin: 3.3 g/dL — ABNORMAL LOW (ref 3.5–5.0)
Alkaline Phosphatase: 218 U/L — ABNORMAL HIGH (ref 38–126)
Anion gap: 8 (ref 5–15)
BUN: 17 mg/dL (ref 8–23)
CO2: 23 mmol/L (ref 22–32)
Calcium: 8.9 mg/dL (ref 8.9–10.3)
Chloride: 104 mmol/L (ref 98–111)
Creatinine, Ser: 0.74 mg/dL (ref 0.61–1.24)
GFR, Estimated: >60 mL/min (ref >60)
Glucose, Bld: 208 mg/dL — ABNORMAL HIGH (ref 70–99)
Potassium: 3.8 mmol/L (ref 3.5–5.1)
Sodium: 135 mmol/L (ref 135–145)
Total Bilirubin: 1.4 mg/dL — ABNORMAL HIGH (ref 0.3–1.2)
Total Protein: 7 g/dL (ref 6.5–8.1)

## 2021-07-13 MED ORDER — DEXTROSE IN LACTATED RINGERS 5 % IV SOLN: INTRAVENOUS | Status: DC

## 2021-07-13 MED ORDER — HYDROMORPHONE HCL 1 MG/ML IJ SOLN: 0.5000 mg | INTRAMUSCULAR | Status: DC | PRN

## 2021-07-13 MED ORDER — PIPERACILLIN-TAZOBACTAM 3.375 G IVPB 30 MIN
3.3750 g | Freq: Once | INTRAVENOUS | Status: AC
  Administered 2021-07-13: 3.375 g via INTRAVENOUS
  Filled 2021-07-13: qty 50

## 2021-07-13 MED ORDER — ENOXAPARIN SODIUM 60 MG/0.6ML IJ SOSY
0.5000 mg/kg | PREFILLED_SYRINGE | INTRAMUSCULAR | Status: DC
  Administered 2021-07-14 – 2021-07-21 (×8): 52.5 mg via SUBCUTANEOUS
  Filled 2021-07-13 (×9): qty 0.6

## 2021-07-13 MED ORDER — SODIUM CHLORIDE 0.9 % IV SOLN
1.0000 g | Freq: Once | INTRAVENOUS | Status: AC
  Administered 2021-07-13: 1 g via INTRAVENOUS
  Filled 2021-07-13: qty 10

## 2021-07-13 MED ORDER — ONDANSETRON HCL 4 MG PO TABS: 4.0000 mg | ORAL_TABLET | Freq: Four times a day (QID) | ORAL | Status: DC | PRN

## 2021-07-13 MED ORDER — ONDANSETRON HCL 4 MG/2ML IJ SOLN
4.0000 mg | Freq: Four times a day (QID) | INTRAMUSCULAR | Status: DC | PRN
  Administered 2021-07-14 – 2021-07-18 (×3): 4 mg via INTRAVENOUS
  Filled 2021-07-13 (×3): qty 2

## 2021-07-13 NOTE — H&P (Signed)
History and Physical    Patient: Jacob Montes AYT:[id number] DOB: 05-03-1940 DOA: 07/13/2021 DOS: the patient was seen and examined on 07/13/2021 PCP: Jerrol Banana., MD  Patient coming from: Home  Chief Complaint:  Chief Complaint  Patient presents with   Fever   HPI: Jacob Montes is a 81 y.o. male with PMH of CAD, AS sp TAVR, HTN, HLD, BPH, GERD and obesity who was discharged yesterday after admission on 5/30. He was presenting with fever, epigastric and RUQ pain, nausea and vomiting, and admitted for acute cholangitis.  He had elevated LFT.  MRCP showed vague areas of heterogeneous enhancement with concern for possible pseudo lesion, hepatic abscess or mass.  Patient was started on IV Zosyn.  GI consulted.   Patient underwent ERCP with removal of choledocholithiasis and sludge by biliary sphincterotomy and balloon extraction on 6/1.  Has post ERCP acute hepatitis and isolated fever the next day.  Blood cultures negative.  Acute hepatitis improved.  Patient was discharged home on oral Augmentin for 8 more days. Presented today again after having worsening fever, chills and abdominal pain. Has fever and Leucocytosis. GI consulted and recommended readmission and IV antibiotics.  Review of Systems: As mentioned in the history of present illness. All other systems reviewed and are negative. 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 s/p 3-v CABG in 07/2007   Carotid artery disease ([place])    a. 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   GERD (gastroesophageal reflux disease)    HLD (hyperlipidemia)    Hyperlipidemia 10/15/2015   Hypertension    Nephrolithiasis 11/16/2018   Pulmonary embolism ([place]) 08/2010   Skin cancer    Sleep apnea    Stroke [facility])    Past Surgical History:  Procedure Laterality Date    Carotid surgery Left 03/08/2007   CHOLECYSTECTOMY  02/16/2018   ENDOSCOPIC RETROGRADE CHOLANGIOPANCREATOGRAPHY (ERCP) WITH PROPOFOL N/A 07/10/2021   Procedure: ENDOSCOPIC RETROGRADE CHOLANGIOPANCREATOGRAPHY (ERCP) WITH PROPOFOL;  Surgeon: [name], MD;  Location: [facility] ENDOSCOPY;  Service: Endoscopy;  Laterality: N/A;   ERCP N/A 11/18/2018   Procedure: ENDOSCOPIC RETROGRADE CHOLANGIOPANCREATOGRAPHY (ERCP);  Surgeon: [name], MD;  Location: [facility] ENDOSCOPY;  Service: Endoscopy;  Laterality: N/A;   Heart Bypass N/A 07/2007   left shoulder surgery Left 06/03/2009   RIGHT AND LEFT HEART CATH  07/18/2007   right shoulder surgery  2003   TEE WITHOUT CARDIOVERSION N/A 11/21/2018   Procedure: TRANSESOPHAGEAL ECHOCARDIOGRAM (TEE);  Surgeon: Wellington Hampshire, MD;  Location: [facility] ORS;  Service: Cardiovascular;  Laterality: N/A;   TVAR     Social History:  reports that he has quit smoking. His smoking use included cigarettes. He has never used smokeless tobacco. He reports that he does not drink alcohol and does not use drugs.  Allergies  Allergen Reactions   Atorvastatin Other (See Comments)    aches   Ramipril Other (See Comments)    cough    Family History  Problem Relation Age of Onset   Hypertension Mother    Stroke Mother    Lung cancer Father    Non-Hodgkin's lymphoma Brother     Prior to Admission medications   Medication Sig Start Date End Date Taking? Authorizing Provider  amLODipine (NORVASC) 10 MG tablet Take 1 tablet by  mouth once daily 06/04/21  Yes Jerrol Banana., MD  amoxicillin-clavulanate (AUGMENTIN) 875-125 MG tablet Take 1 tablet by mouth 2 (two) times daily for 8 days. 07/12/21 07/20/21 Yes Mercy Riding, MD  aspirin 81 MG chewable tablet Chew 81 mg by mouth daily.   Yes [provider]  docusate sodium (COLACE) 250 MG capsule Take 250 mg by mouth daily as needed for constipation.    Yes [provider]  furosemide (LASIX) 40 MG tablet Take 1  tablet (40 mg total) by mouth 3 (three) times a week. Patient taking differently: Take 40 mg by mouth 3 (three) times a week. Mon/wed/fri 08/21/20  Yes Gollan, Kathlene November, MD  metoprolol tartrate (LOPRESSOR) 25 MG tablet Take 1 tablet (25 mg total) by mouth 2 (two) times daily. 08/21/20  Yes Gollan, Kathlene November, MD  montelukast (SINGULAIR) 10 MG tablet Take 1 tablet (10 mg total) by mouth at bedtime as needed (allergies). 05/22/19  Yes Jerrol Banana., MD  Multiple Vitamin (MULTIVITAMIN) tablet Take 1 tablet by mouth daily.   Yes [provider]  omeprazole (PRILOSEC) 20 MG capsule Take 20 mg by mouth daily. As needed   Yes [provider]  potassium chloride (KLOR-CON) 10 MEQ tablet Take 1 tablet (10 mEq total) by mouth 3 (three) times a week. Take with lasix Patient taking differently: Take 10 mEq by mouth 3 (three) times a week. Take with lasix mon wed frid 08/21/20 07/13/21 Yes Gollan, Kathlene November, MD  rosuvastatin (CRESTOR) 20 MG tablet Take 1 tablet (20 mg total) by mouth daily. 07/19/21  Yes Mercy Riding, MD  tamsulosin (FLOMAX) 0.4 MG CAPS capsule Take 1 capsule by mouth once daily 12/18/20  Yes Jerrol Banana., MD    Physical Exam: Vitals:   07/13/21 1956 07/13/21 2030 07/13/21 2122  BP: (!) 131/52 (!) 134/47 (!) 129/49  Pulse: 90 71 71  Resp: 20 (!) 22 (!) 22  Temp: 99.5 F (37.5 C) 98.3 F (36.8 C)   TempSrc: Oral Oral   SpO2: 93% 95% 95%  Weight: 104.8 kg    Height: '5\' 6"'$  (1.676 m)     General: Morbidly obese, acutely ill looking, in mild distress HEENT: PERRL, EOMI, mild conjunctival jaundice Neck: Supple, no JVD, no lymphadenopathy Respiratory: Good air entry bilaterally, no wheeze, no rales no crackles Cardiovascular system: Sinus tachycardia Abdomen: Soft, mildly distended, diffuse tenderness especially right upper quadrant Extremity: No edema sinus or clubbing  Data Reviewed:  Temperature is 99.5, blood pressure 129/49, pulse 90, respiratory 20  and oxygen sat 93% on room air.  White count 11.2 hemoglobin 12.7, platelets 246.  Chemistry largely within normal.  LFTs showed alkaline phosphatase of 280 albumin 3.3.  AST of 65 ALT 127 total protein 7.0 total bilirubin 1.4.  Chest x-ray showed no acute findings.  Ultrasound of the right upper quadrant currently pending.  Assessment and Plan:  #1 acute ascending cholangitis: Patient has failed outpatient treatment.  We will keep NPO.  Readmitted to the hospital.  IV Rocephin and Flagyl.  GI already consulted.  Patient will be reevaluated.  #2 coronary artery disease: Stable.  No acute decompensation.  #3 BPH: Continue with Flomax  #4 essential hypertension: Patient normally on amlodipine and metoprolol.  We will keep n.p.o. also IV Lopressor as needed.  #5 morbid obesity: With his BPH of more than 37.  Dietary counseling  #6 aortic stenosis: Patient is status post TAVR.  Appears stable at the moment.  Advance Care Planning:   Code Status: Prior full code  Consults: Dr. Marius Ditch, GI  Family Communication: Family at bedside.  Discussed in detail.  Severity of Illness: The appropriate patient status for this patient is INPATIENT. Inpatient status is judged to be reasonable and necessary in order to provide the required intensity of service to ensure the patient's safety. The patient's presenting symptoms, physical exam findings, and initial radiographic and laboratory data in the context of their chronic comorbidities is felt to place them at high risk for further clinical deterioration. Furthermore, it is not anticipated that the patient will be medically stable for discharge from the hospital within 2 midnights of admission.   * I certify that at the point of admission it is my clinical judgment that the patient will require inpatient hospital care spanning beyond 2 midnights from the point of admission due to high intensity of service, high risk for further deterioration and high  frequency of surveillance required.*  AuthorBarbette Merino, MD 07/13/2021 10:06 PM  For on call review [email or web address].

## 2021-07-13 NOTE — ED Notes (Signed)
Pt to ED for fever, pt was released yesterday after admission of ERCP (performed 6/1) . Since discharge pt has had fever at home.  Temp up to 102.6, pt last took tylenol at 6:45 pm   Pt denies any current CP, SOB, Chills, bodyaches.  Denies urinary symptoms  Pt is A&Ox4.

## 2021-07-13 NOTE — ED Notes (Addendum)
ONE SET OF BLOOD CULTURES SENT TO LAB, BLUE TOP SENT TO LAB

## 2021-07-13 NOTE — ED Triage Notes (Signed)
Pt states had a ercp this week and has had  fever since discharging from hospital yesterday. Pt states temp has been 102 at home. Pt states is on oral antibiotics. Pt appears in no acute distress. Denies abd pain, burning with urination or cough.

## 2021-07-13 NOTE — Progress Notes (Signed)
PHARMACIST - PHYSICIAN COMMUNICATION  CONCERNING:  Enoxaparin (Lovenox) for DVT Prophylaxis    RECOMMENDATION: Patient was prescribed enoxaprin '40mg'$  q24 hours for VTE prophylaxis.   Filed Weights   07/13/21 1956  Weight: 104.8 kg (231 lb)    Body mass index is 37.28 kg/m.  Estimated Creatinine Clearance: 83.5 mL/min (by C-G formula based on SCr of 0.74 mg/dL).   Based on [place] patient is candidate for enoxaparin 0.'5mg'$ /kg TBW SQ every 24 hours based on BMI being >30.   DESCRIPTION: Pharmacy has adjusted enoxaparin dose per [facility] policy.  Patient is now receiving enoxaparin 0.5 mg/kg every 24 hours   Renda Rolls, PharmD, [facility] 07/13/2021 10:32 PM

## 2021-07-13 NOTE — ED Provider Notes (Signed)
[facility] Provider Note    Event Date/Time   First MD Initiated Contact with Patient 07/13/21 2043     (approximate)  History   Chief Complaint: Fever  HPI  Jacob Montes is a 81 y.o. male with a past medical history of CAD, hypertension, hyperlipidemia, presents to the emergency department for fever.  Patient was just discharged from the hospital yesterday after an admission for likely cholangitis underwent ERCP on Thursday (3 days ago).  Patient had downtrending LFTs and was afebrile upon discharge.  Wife states shortly after discharge when they got home the patient spiked a fever once again has been having fever to 101 and now 102 today.  They called the on-call GI doctor who referred him to the emergency department.  Here patient denies any abdominal pain.  Continues to have fever but took Tylenol an hour before arrival and is currently 99.5.  Denies any vomiting denies any dysuria no cough or congestion.  Physical Exam   Triage Vital Signs: ED Triage Vitals [07/13/21 1956]  Enc Vitals Group     BP (!) 131/52     Pulse Rate 90     Resp 20     Temp 99.5 F (37.5 C)     Temp Source Oral     SpO2 93 %     Weight 231 lb (104.8 kg)     Height '5\' 6"'$  (1.676 m)     Head Circumference      Peak Flow      Pain Score 0     Pain Loc      Pain Edu?      Excl. in [place]?     Most recent vital signs: Vitals:   07/13/21 1956 07/13/21 2030  BP: (!) 131/52 (!) 134/47  Pulse: 90 71  Resp: 20 (!) 22  Temp: 99.5 F (37.5 C) 98.3 F (36.8 C)  SpO2: 93% 95%    General: Awake, no distress.  CV:  Good peripheral perfusion.  Regular rate and rhythm  Resp:  Normal effort.  Equal breath sounds bilaterally.  Abd:  No distention.  Soft, nontender.  No rebound or guarding.    ED Results / Procedures / Treatments   RADIOLOGY  I have interpreted the chest x-ray images no acute abnormality seen on my evaluation. Radiology is read the chest x-ray is  negative.   MEDICATIONS ORDERED IN ED: Medications  piperacillin-tazobactam (ZOSYN) IVPB 3.375 g (has no administration in time range)  cefTRIAXone (ROCEPHIN) 1 g in sodium chloride 0.9 % 100 mL IVPB (has no administration in time range)     IMPRESSION / MDM / ASSESSMENT AND PLAN / ED COURSE  I reviewed the triage vital signs and the nursing notes.  Patient's presentation is most consistent with acute presentation with potential threat to life or bodily function.  Patient presents emergency department with continued fever 3 days after an ERCP for cholangitis and choledocholithiasis.  Patient currently afebrile but took Tylenol just before arrival.  Wife has well documented temperatures every couple hours that have been showing fever as high as 102 today.  I spoke to Dr. Marius Ditch of GI medicine who recommends resending blood cultures starting Zosyn and Rocephin and admitting to the hospitalist service. No other obvious infectious etiology.  Denies any urinary symptoms.  Chest x-ray is clear.  I have added a COVID swab as a precaution.  Patient's lab work shows CBC with a white blood cell count of 11.2.  Patient's chemistry  shows downtrending LFTs.  Chest x-ray is clear.  FINAL CLINICAL IMPRESSION(S) / ED DIAGNOSES   Cholangitis Fever    Note:  This document was prepared using Dragon voice recognition software and may include unintentional dictation errors.   Harvest Dark, MD 07/13/21 2141

## 2021-07-14 ENCOUNTER — Encounter: Payer: Self-pay | Admitting: Internal Medicine

## 2021-07-14 ENCOUNTER — Inpatient Hospital Stay: Payer: Medicare Other

## 2021-07-14 ENCOUNTER — Telehealth: Payer: Self-pay

## 2021-07-14 DIAGNOSIS — R748 Abnormal levels of other serum enzymes: Secondary | ICD-10-CM

## 2021-07-14 DIAGNOSIS — K750 Abscess of liver: Secondary | ICD-10-CM

## 2021-07-14 DIAGNOSIS — R509 Fever, unspecified: Secondary | ICD-10-CM

## 2021-07-14 DIAGNOSIS — K8309 Other cholangitis: Secondary | ICD-10-CM | POA: Diagnosis not present

## 2021-07-14 LAB — COMPREHENSIVE METABOLIC PANEL
ALT: 100 U/L — ABNORMAL HIGH (ref 0–44)
AST: 47 U/L — ABNORMAL HIGH (ref 15–41)
Albumin: 2.9 g/dL — ABNORMAL LOW (ref 3.5–5.0)
Alkaline Phosphatase: 173 U/L — ABNORMAL HIGH (ref 38–126)
Anion gap: 5 (ref 5–15)
BUN: 14 mg/dL (ref 8–23)
CO2: 27 mmol/L (ref 22–32)
Calcium: 8.8 mg/dL — ABNORMAL LOW (ref 8.9–10.3)
Chloride: 106 mmol/L (ref 98–111)
Creatinine, Ser: 0.59 mg/dL — ABNORMAL LOW (ref 0.61–1.24)
GFR, Estimated: >60 mL/min (ref >60)
Glucose, Bld: 119 mg/dL — ABNORMAL HIGH (ref 70–99)
Potassium: 3.5 mmol/L (ref 3.5–5.1)
Sodium: 138 mmol/L (ref 135–145)
Total Bilirubin: 0.8 mg/dL (ref 0.3–1.2)
Total Protein: 6.1 g/dL — ABNORMAL LOW (ref 6.5–8.1)

## 2021-07-14 LAB — CBC
HCT: 33.1 % — ABNORMAL LOW (ref 39.0–52.0)
Hemoglobin: 11.4 g/dL — ABNORMAL LOW (ref 13.0–17.0)
MCH: 32.3 pg (ref 26.0–34.0)
MCHC: 34.4 g/dL (ref 30.0–36.0)
MCV: 93.8 fL (ref 80.0–100.0)
Platelets: 217 10*3/uL (ref 150–400)
RBC: 3.53 MIL/uL — ABNORMAL LOW (ref 4.22–5.81)
RDW: 13.1 % (ref 11.5–15.5)
WBC: 8.8 10*3/uL (ref 4.0–10.5)
nRBC: 0 % (ref 0.0–0.2)

## 2021-07-14 MED ORDER — METOPROLOL TARTRATE 25 MG PO TABS
25.0000 mg | ORAL_TABLET | Freq: Two times a day (BID) | ORAL | Status: DC
  Administered 2021-07-14: 25 mg via ORAL
  Filled 2021-07-14: qty 1

## 2021-07-14 MED ORDER — ADULT MULTIVITAMIN W/MINERALS CH
1.0000 | ORAL_TABLET | Freq: Every day | ORAL | Status: DC
  Administered 2021-07-21 – 2021-07-22 (×2): 1 via ORAL
  Filled 2021-07-14 (×7): qty 1

## 2021-07-14 MED ORDER — SODIUM CHLORIDE 0.9 % IV SOLN
3.0000 g | Freq: Four times a day (QID) | INTRAVENOUS | Status: DC
  Filled 2021-07-14: qty 8

## 2021-07-14 MED ORDER — PANTOPRAZOLE SODIUM 40 MG PO TBEC
40.0000 mg | DELAYED_RELEASE_TABLET | Freq: Every day | ORAL | Status: DC
  Administered 2021-07-14 – 2021-07-22 (×9): 40 mg via ORAL
  Filled 2021-07-14 (×10): qty 1

## 2021-07-14 MED ORDER — SODIUM CHLORIDE 0.9 % IV SOLN
2.0000 g | INTRAVENOUS | Status: DC
  Administered 2021-07-14: 2 g via INTRAVENOUS
  Filled 2021-07-14: qty 2

## 2021-07-14 MED ORDER — DOCUSATE SODIUM 100 MG PO CAPS
200.0000 mg | ORAL_CAPSULE | Freq: Every day | ORAL | Status: DC | PRN
Start: 2021-07-14 — End: 2021-07-22

## 2021-07-14 MED ORDER — ACETAMINOPHEN 325 MG PO TABS
650.0000 mg | ORAL_TABLET | Freq: Four times a day (QID) | ORAL | Status: DC | PRN
  Administered 2021-07-14 – 2021-07-18 (×7): 650 mg via ORAL
  Filled 2021-07-14 (×8): qty 2

## 2021-07-14 MED ORDER — PIPERACILLIN-TAZOBACTAM 3.375 G IVPB 30 MIN
3.3750 g | Freq: Three times a day (TID) | INTRAVENOUS | Status: DC
  Administered 2021-07-14 – 2021-07-17 (×9): 3.375 g via INTRAVENOUS
  Filled 2021-07-14 (×13): qty 50

## 2021-07-14 MED ORDER — IBUPROFEN 400 MG PO TABS
600.0000 mg | ORAL_TABLET | Freq: Once | ORAL | Status: AC
  Administered 2021-07-14: 600 mg via ORAL
  Filled 2021-07-14: qty 2

## 2021-07-14 MED ORDER — MONTELUKAST SODIUM 10 MG PO TABS: 10.0000 mg | ORAL_TABLET | Freq: Every evening | ORAL | Status: DC | PRN

## 2021-07-14 MED ORDER — AMLODIPINE BESYLATE 10 MG PO TABS
10.0000 mg | ORAL_TABLET | Freq: Every day | ORAL | Status: DC
  Administered 2021-07-14 – 2021-07-22 (×9): 10 mg via ORAL
  Filled 2021-07-14 (×9): qty 1

## 2021-07-14 MED ORDER — IOHEXOL 9 MG/ML PO SOLN
500.0000 mL | ORAL | Status: AC
  Administered 2021-07-14 (×2): 500 mL via ORAL

## 2021-07-14 MED ORDER — IOHEXOL 300 MG/ML  SOLN
100.0000 mL | Freq: Once | INTRAMUSCULAR | Status: AC | PRN
  Administered 2021-07-14: 100 mL via INTRAVENOUS

## 2021-07-14 MED ORDER — METRONIDAZOLE 500 MG/100ML IV SOLN
500.0000 mg | Freq: Two times a day (BID) | INTRAVENOUS | Status: DC
  Administered 2021-07-14: 500 mg via INTRAVENOUS
  Filled 2021-07-14: qty 100

## 2021-07-14 MED ORDER — SODIUM CHLORIDE 0.9 % IV SOLN: INTRAVENOUS | Status: DC | PRN

## 2021-07-14 NOTE — Progress Notes (Signed)
Pharmacy Antibiotic Note  Jacob Montes is a 81 y.o. male admitted on 07/13/2021 with  IAI .  Pharmacy has been consulted for Unasyn dosing. -fever spike 6/5 am  Plan: Unasyn 3 gm IV q6h   Height: '5\' 6"'$  (167.6 cm) Weight: 104.9 kg (231 lb 4.2 oz) IBW/kg (Calculated) : 63.8  Temp (24hrs), Avg:98.7 F (37.1 C), Min:97.6 F (36.4 C), Max:100.2 F (37.9 C)  Recent Labs  Lab 07/08/21 0914 07/08/21 2017 07/09/21 0531 07/10/21 0519 07/11/21 0501 07/12/21 0318 07/13/21 2007 07/14/21 0717  WBC 11.0*  --  10.5 10.1 5.5  --  11.2* 8.8  CREATININE 0.68  --  0.60* 0.72 0.65 0.83 0.74 0.59*  LATICACIDVEN <0.3* 1.5  --   --   --   --  1.7  --     Estimated Creatinine Clearance: 83.5 mL/min (A) (by C-G formula based on SCr of 0.59 mg/dL (L)).    Allergies  Allergen Reactions   Atorvastatin Other (See Comments)    aches   Ramipril Other (See Comments)    cough    Antimicrobials this admission: Zosyn 6/4 x 1 CTX 6/4>>6/5 Metro 6/5 >>6/5 Unasyn 6/5>>  Neuro/Pain:  Dose adjustments this admission:    Microbiology results: 6/4 BCx: NG<12hr   UCx:      Sputum:      MRSA PCR:    Thank you for allowing pharmacy to be a part of this patient's care.  Joakim Huesman A 07/14/2021 1:32 PM

## 2021-07-14 NOTE — Telephone Encounter (Addendum)
Transition Care Management Unsuccessful Follow-up Telephone Call  Date of discharge and from where:  TCM DC [facility] 07-12-21 Dx: acute cholangitis with cholelithiasis   Attempts:  1st Attempt  Reason for unsuccessful TCM follow-up call:  Left voice message   Per family pt is back in the hospital

## 2021-07-14 NOTE — Telephone Encounter (Signed)
Pt is back in the hospital.  Please call back when he gets home.

## 2021-07-14 NOTE — Progress Notes (Signed)
Mobility Specialist - Progress Note   07/14/21 1200  Mobility  Activity Ambulated independently in hallway;Stood at bedside;Dangled on edge of bed  Level of Assistance Independent  Assistive Device None  Distance Ambulated (ft) 400 ft  Activity Response Tolerated well  $Mobility charge 1 Mobility   Pt supine upon arrival using RA. Completes all activities indep and ambulates voicing no complaints. Pt returns to bed with needs in reach and family at bedside.  Merrily Brittle Mobility Specialist 07/14/21, 12:15 PM

## 2021-07-14 NOTE — Consult Note (Signed)
Jacob Darby, MD [address]  [place]  [place], [place] [address]  Main: [phone]  Fax: [phone] Pager: [phone]   Consultation  Referring Provider:     No ref. provider found Primary Care Physician:  Jerrol Banana., MD Primary Gastroenterologist:  Dr. Vicente Males    Reason for Consultation: Fever, recent ERCP  Date of Admission:  07/13/2021 Date of Consultation:  07/14/2021         HPI:   Jacob Montes is a 81 y.o. male with history of aortic stenosis s/p TAVR in 03/2017, coronary artery disease s/p CABG, history of ERCP with biliary sphincterotomy for choledocholithiasis in 11/2018, s/p cholecystectomy was previously admitted on 5/30 secondary to ascending cholangitis.  Underwent ERCP on 6/1 with biliary sphincterotomy and stone extraction.  Post ERCP, patient developed worsening of LFTs alkaline phosphatase 251, AST 437, ALT 321, T. bili 3.3.  However his LFTs continued to improve and he was discharged home on 6/3.  Patient had low-grade fever on the day of discharge.  [name], his wife called me last night that he was running temperature of 101.2.  I have advised patient's wife to bring him to the ER right away.  Patient has been hemodynamically stable, has been running low-grade fevers overnight, receiving Tylenol.  His LFTs continue to improve.  He had mild leukocytosis which has currently resolved.  When interviewed the patient, both patient's wife and his son were at bedside.  Patient is on clear liquid diet tolerating well and would like to eat.  He denies any abdominal pain, nausea or vomiting.  He did have some chills this morning.  Blood cultures are pending.  He is started on antibiotics.   NSAIDs: None  Antiplts/Anticoagulants/Anti thrombotics: None  GI Procedures: ERCP on 6/1, biliary sphincterotomy, stone extraction  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 s/p 3-v CABG  in 07/2007   Carotid artery disease ([place])    a. 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   GERD (gastroesophageal reflux disease)    HLD (hyperlipidemia)    Hyperlipidemia 10/15/2015   Hypertension    Nephrolithiasis 11/16/2018   Pulmonary embolism ([place]) 08/2010   Skin cancer    Sleep apnea    Stroke [facility])     Past Surgical History:  Procedure Laterality Date   Carotid surgery Left 03/08/2007   CHOLECYSTECTOMY  02/16/2018   ENDOSCOPIC RETROGRADE CHOLANGIOPANCREATOGRAPHY (ERCP) WITH PROPOFOL N/A 07/10/2021   Procedure: ENDOSCOPIC RETROGRADE CHOLANGIOPANCREATOGRAPHY (ERCP) WITH PROPOFOL;  Surgeon: [name], MD;  Location: [facility] ENDOSCOPY;  Service: Endoscopy;  Laterality: N/A;   ERCP N/A 11/18/2018   Procedure: ENDOSCOPIC RETROGRADE CHOLANGIOPANCREATOGRAPHY (ERCP);  Surgeon: [name], MD;  Location: [facility] ENDOSCOPY;  Service: Endoscopy;  Laterality: N/A;   Heart Bypass N/A 07/2007   left shoulder surgery Left 06/03/2009   RIGHT AND LEFT HEART CATH  07/18/2007   right shoulder surgery  2003   TEE WITHOUT CARDIOVERSION N/A 11/21/2018   Procedure: TRANSESOPHAGEAL ECHOCARDIOGRAM (TEE);  Surgeon: Wellington Hampshire, MD;  Location: [facility] ORS;  Service: Cardiovascular;  Laterality: N/A;   TVAR     Current Facility-Administered Medications:    0.9 %  sodium chloride infusion, , Intravenous, PRN, Fritzi Mandes, MD, Last Rate: 10 mL/hr at 07/14/21 1153, [place]  [place] at 07/14/21 1153   acetaminophen (TYLENOL) tablet 650 mg, 650 mg, Oral, Q6H PRN, Fritzi Mandes, MD   cefTRIAXone (ROCEPHIN) 2 g in sodium chloride 0.9 % 100 mL IVPB, 2 g, Intravenous, Q24H, Noralee Space, RPH, Last Rate: 200 mL/hr at 07/14/21 1155, 2 g at 07/14/21 1155   enoxaparin (LOVENOX) injection 52.5 mg, 0.5 mg/kg, Subcutaneous, Q24H, Garba, Mohammad L, MD, 52.5 mg at 07/14/21 0749   HYDROmorphone (DILAUDID) injection 0.5-1 mg,  0.5-1 mg, Intravenous, Q2H PRN, Jonelle Sidle, Mohammad L, MD   metroNIDAZOLE (FLAGYL) IVPB 500 mg, 500 mg, Intravenous, Q12H, Noralee Space, RPH, Last Rate: 100 mL/hr at 07/14/21 0942, 500 mg at 07/14/21 0942   ondansetron (ZOFRAN) tablet 4 mg, 4 mg, Oral, Q6H PRN **OR** ondansetron (ZOFRAN) injection 4 mg, 4 mg, Intravenous, Q6H PRN, Elwyn Reach, MD  Family History  Problem Relation Age of Onset   Hypertension Mother    Stroke Mother    Lung cancer Father    Non-Hodgkin's lymphoma Brother      Social History   Tobacco Use   Smoking status: Former    Types: Cigarettes   Smokeless tobacco: Never   Tobacco comments:    Quit in 1979  Vaping Use   Vaping Use: Never used  Substance Use Topics   Alcohol use: Never   Drug use: Never    Allergies as of 07/13/2021 - Review Complete 07/13/2021  Allergen Reaction Noted   Atorvastatin Other (See Comments) 06/11/2005   Ramipril Other (See Comments) 01/12/2006    Review of Systems:    All systems reviewed and negative except where noted in HPI.   Physical Exam:  Vital signs in last 24 hours: Temp:  [97.6 F (36.4 C)-100.2 F (37.9 C)] 98.4 F (36.9 C) (06/05 0730) Pulse Rate:  [64-97] 78 (06/05 0730) Resp:  [15-22] 18 (06/05 0730) BP: (124-154)/(46-73) 132/62 (06/05 0730) SpO2:  [93 %-97 %] 93 % (06/05 0730) Weight:  [104.8 kg-104.9 kg] 104.9 kg (06/04 2354) Last BM Date : 07/13/21 General:   Pleasant, cooperative in NAD Head:  Normocephalic and atraumatic. Eyes:   No icterus.   Conjunctiva pink. PERRLA. Ears:  Normal auditory acuity. Neck:  Supple; no masses or thyroidomegaly Lungs: Respirations even and unlabored. Lungs clear to auscultation bilaterally.   No wheezes, crackles, or rhonchi.  Heart:  Regular rate and rhythm;  Without murmur, clicks, rubs or gallops Abdomen:  Soft, nondistended, nontender. Normal bowel sounds. No appreciable masses or hepatomegaly.  No rebound or guarding.  Rectal:  Not performed. Msk:   Symmetrical without gross deformities.  Strength normal Extremities:  Without edema, cyanosis or clubbing. Neurologic:  Alert and oriented x3;  grossly normal neurologically. Skin:  Intact without significant lesions or rashes. Psych:  Alert and cooperative. Normal affect.  LAB RESULTS:    Latest Ref Rng & Units 07/14/2021    7:17 AM 07/13/2021    8:07 PM 07/11/2021    5:01 AM  CBC  WBC 4.0 - 10.5 K/uL 8.8   11.2   5.5    Hemoglobin 13.0 - 17.0 g/dL 11.4   12.7   11.8    Hematocrit 39.0 - 52.0 % 33.1   35.3   34.3    Platelets 150 - 400 K/uL 217   246   179      BMET    Latest Ref Rng & Units 07/14/2021    7:17 AM 07/13/2021    8:07 PM 07/12/2021    3:18 AM  BMP  Glucose 70 - 99 mg/dL 119   208   101    BUN 8 - 23 mg/dL 14   17   15     Creatinine 0.61 - 1.24 mg/dL 0.59   0.74   0.83    Sodium 135 - 145 mmol/L 138   135   137    Potassium 3.5 - 5.1 mmol/L 3.5   3.8   4.2    Chloride 98 - 111 mmol/L 106   104   105    CO2 22 - 32 mmol/L 27   23   27     Calcium 8.9 - 10.3 mg/dL 8.8   8.9   8.9      LFT    Latest Ref Rng & Units 07/14/2021    7:17 AM 07/13/2021    8:07 PM 07/12/2021    3:18 AM  Hepatic Function  Total Protein 6.5 - 8.1 g/dL 6.1   7.0   6.3    Albumin 3.5 - 5.0 g/dL 2.9   3.3   3.0    AST 15 - 41 U/L 47   65   173    ALT 0 - 44 U/L 100   127   217    Alk Phosphatase 38 - 126 U/L 173   218   240    Total Bilirubin 0.3 - 1.2 mg/dL 0.8   1.4   1.4       STUDIES: DG Chest 2 View  Result Date: 07/13/2021 CLINICAL DATA:  Fever. EXAM: CHEST - 2 VIEW COMPARISON:  Radiograph 04/22/2021 FINDINGS: Post median sternotomy and TAVR. Stable mild cardiomegaly. Unchanged mediastinal contours. No focal airspace disease or pleural effusion. No pulmonary edema. No pneumothorax. Thoracic spondylosis. IMPRESSION: Stable mild cardiomegaly. No acute abnormality. Electronically Signed   By: Keith Rake M.D.   On: 07/13/2021 20:26   US ABDOMEN LIMITED RUQ (LIVER/GB)  Result Date:  07/13/2021 CLINICAL DATA:  Fever recent ERCP EXAM: ULTRASOUND ABDOMEN LIMITED RIGHT UPPER QUADRANT COMPARISON:  Ultrasound 05/05/2021, MRI 07/08/2021 FINDINGS: Gallbladder: Surgically absent Common bile duct: Diameter: 3.5 mm Liver: Echogenicity within normal limits. Linear echogenic areas within the right hepatic lobe with dirty shadowing suspected to represent air in the biliary tree. Portal vein is patent on color Doppler imaging with normal direction of blood flow towards the liver. Other: None. IMPRESSION: 1. Linear echogenic areas in the right hepatic lobe with features suggesting air in the biliary system potentially related to history of recent ERCP 2. Status post cholecystectomy Electronically Signed   By: Donavan Foil M.D.   On: 07/13/2021 22:02      Impression / Plan:   Kendal Raffo is a 81 y.o. male with history of aortic stenosis s/p TAVR, coronary artery disease s/p CABG, was originally admitted on 5/30 secondary to ascending cholangitis from choledocholithiasis, s/p ERCP on 6/1 with balloon extraction of the stones and biliary sphincterotomy.  Post ERCP complicated by acute hepatitis and fever, blood cultures have been negative and LFTs have been improving.  Apparently patient was discharged home on oral Augmentin for 8 days.  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      LOS: 1 day   Sherri Sear, MD  07/14/2021, 1:07 PM    Note:  This dictation was prepared with Dragon dictation along with smaller phrase technology. Any transcriptional errors that result from this process are unintentional.

## 2021-07-14 NOTE — Consult Note (Signed)
NAME: Jacob Montes  DOB: Dec 04, 1940  MRN: [id number]  Date/Time: 07/14/2021 9:05 PM  REQUESTING PROVIDER: Dr. Posey Pronto Subjective:  REASON FOR CONSULT: Ascending cholangitis ? Jacob Montes is a 81 y.o. male with a history of CAD, status post CABG, TAVR for aortic stenosis, cholecystectomy, diverticulitis, CBD stone in the past, Enterococcus bacteremia in 2020 presents to the hospital with fever and chills on 07/13/2021.Marland Kitchen Patient had initially seen his PCP on 07/03/2021 with abdominal pain and constipation.  The pain was located in the upper and center of the abdomen.  He also had same symptoms of fever, abdominal bloating and gas at that time.  He had a Tmax of 101 at home..  Dr. Rosanna Randy sent some blood test and that showed a bilirubin total of two 5.6, alkaline phosphatase of 202, AST 302 and ALT 382.  Vitals in the doctor's office was okay.  Dr. Rosanna Randy ordered a KUB to rule out possible SBO and put him on doxycycline for possible diverticulitis.. X-ray abdomen was nonspecific bowel gas pattern with small stool burden in the colon without signs of fecal impaction in the rectosigmoid.  He had an ultrasound of the abdomen on 07/05/2021 which did not show any bile duct dilatation.  Patient presented to the ED on 07/08/2021 with fever and persistent upper abdominal pain.  On 5/30 alkaline phosphatase was 125, ALT 80, AST 38, and bilirubin 1.3.  All of the LFTs seem to have improved since 07/03/2021.  Patient had normal vitals on presentation MRI of the abdomen showed no substantial biliary duct dilatation but with filling defect in the distal common bile duct that may reflect a group of small stones or coalescent sludge.  He was diagnosed with cholangitis and started on Zosyn.  GI saw the patient and an ERCP was done onOn 07/10/2021 and a filling defect consistent with a stone and sludge was seen on the cholangiogram.  Choledocholithiasis was found.  Complete removal was accomplished by biliary sphincterotomy and balloon  extraction.  LFTs went up after the procedure with AST of 476, ALT of 322, alkaline phosphatase of 265 and total bili of 3.6.He had a temp of 102..  On 07/12/2021 he had no fever and AST is 173, ALT 217, alkaline phosphatase 214 bilirubin total was 1.4 as there was improvement in his LFTs and the patient was doing well with no fever he was discharged home on p.o. Augmentin by the hospitalist.  Since he went home he has been having fever with chills and the Tmax has been 102 and the pain started and since the patient was brought back to the emergency department within 24 hours on Sunday evening.  Vitals in the ED temperature of 99.5, BP 131/52, pulse 90, sats 93%.  WBC 11.2, Hb 12.7, platelet 246.  He was started on Unasyn Today he had a temperature of 103 with chills and the antibiotic was changed to Zosyn.  I am asked to see the patient for the same CT abdomen with contrast was done and that showed mild heterogeneous appearance of the liver with tiny less than 5 mm lesions appearance was similar to findings on recent MRI and consistent with inflammatory changes.  Small abscesses could not be excluded. Patient is feeling better.  He said he slept and he woke up with more energy.  No fever or chills now 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 s/p 3-v  CABG in 07/2007   Carotid artery disease (HCC)    a. 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   GERD (gastroesophageal reflux disease)    HLD (hyperlipidemia)    Hyperlipidemia 10/15/2015   Hypertension    Nephrolithiasis 11/16/2018   Pulmonary embolism ([place]) 08/2010   Skin cancer    Sleep apnea    Stroke [facility])     Past Surgical History:  Procedure Laterality Date   Carotid surgery Left 03/08/2007   CHOLECYSTECTOMY  02/16/2018   ENDOSCOPIC RETROGRADE  CHOLANGIOPANCREATOGRAPHY (ERCP) WITH PROPOFOL N/A 07/10/2021   Procedure: ENDOSCOPIC RETROGRADE CHOLANGIOPANCREATOGRAPHY (ERCP) WITH PROPOFOL;  Surgeon: [name], MD;  Location: [facility] ENDOSCOPY;  Service: Endoscopy;  Laterality: N/A;   ERCP N/A 11/18/2018   Procedure: ENDOSCOPIC RETROGRADE CHOLANGIOPANCREATOGRAPHY (ERCP);  Surgeon: [name], MD;  Location[facility] END[facility]COPY;  Service: Endoscopy;  Laterality: N/A;   Heart Bypass N/A 07/2007   left shoulder surgery Left 06/03/2009   RIGHT AND LEFT HEART CATH  07/18/2007   right shoulder surgery  2003   TEE WITHOUT CARDIOVERSION N/A 11/21/2018   Procedure: TRANSESOPHAGEAL ECHOCARDIOGRAM (TEE);  Surgeon: Wellington Hampshire, MD;  Location: [facility] ORS;  Service: Cardiovascular;  Laterality: N/A;   TVAR      Social History   Socioeconomic History   Marital status: Married    Spouse name: Not on file   Number of children: 2   Years of education: Not on file   Highest education level: Some college, no degree  Occupational History   Occupation: retired  Tobacco Use   Smoking status: Former    Types: Cigarettes   Smokeless tobacco: Never   Tobacco comments:    Quit in [place] Use: Never used  Substance and Sexual Activity   Alcohol use: Never   Drug use: Never   Sexual activity: Yes    Partners: Female  Other Topics Concern   Not on file  Social History Narrative   Not on file   Social Determinants of Health   Financial Resource Strain: Low Risk    Difficulty of Paying Living Expenses: Not hard at all  Food Insecurity: No Food Insecurity   Worried About Charity fundraiser in the Last Year: Never true   [place] in the Last Year: Never true  Transportation Needs: No Transportation Needs   Lack of Transportation (Medical): No   Lack of Transportation (Non-Medical): No  Physical Activity: Insufficiently Active   Days of Exercise per Week: 2 days   Minutes of Exercise per Session: 20 min  Stress: No  Stress Concern Present   Feeling of Stress : Not at all  Social Connections: Moderately Integrated   Frequency of Communication with Friends and Family: More than three times a week   Frequency of Social Gatherings with Friends and Family: Once a week   Attends Religious Services: More than 4 times per year   Active Member of [employer] or Organizations: No   Attends Archivist Meetings: Never   Marital Status: Married  Human resources officer Violence: Not At Risk   Fear of Current or Ex-Partner: No   Emotionally Abused: No   Physically Abused: No   Sexually Abused: No    Family History  Problem Relation Age of Onset   Hypertension Mother    Stroke Mother    Lung cancer Father  Non-Hodgkin's lymphoma Brother    Allergies  Allergen Reactions   Atorvastatin Other (See Comments)    aches   Ramipril Other (See Comments)    cough   I? Current Facility-Administered Medications  Medication Dose Route Frequency Provider Last Rate Last Admin   0.9 %  sodium chloride infusion   Intravenous PRN Fritzi Mandes, MD 75 mL/hr at 07/14/21 1932 New Bag at 07/14/21 1932   acetaminophen (TYLENOL) tablet 650 mg  650 mg Oral Q6H PRN Fritzi Mandes, MD   650 mg at 07/14/21 1957   amLODipine (NORVASC) tablet 10 mg  10 mg Oral Daily Fritzi Mandes, MD   10 mg at 07/14/21 1717   docusate sodium (COLACE) capsule 200 mg  200 mg Oral Daily PRN Fritzi Mandes, MD       enoxaparin (LOVENOX) injection 52.5 mg  0.5 mg/kg Subcutaneous Q24H Gala Romney L, MD   52.5 mg at 07/14/21 0749   HYDROmorphone (DILAUDID) injection 0.5-1 mg  0.5-1 mg Intravenous Q2H PRN Elwyn Reach, MD       metoprolol tartrate (LOPRESSOR) tablet 25 mg  25 mg Oral BID Fritzi Mandes, MD       montelukast (SINGULAIR) tablet 10 mg  10 mg Oral QHS PRN Fritzi Mandes, MD       multivitamin with minerals tablet 1 tablet  1 tablet Oral Daily Fritzi Mandes, MD       ondansetron [facility]) tablet 4 mg  4 mg Oral Q6H PRN Elwyn Reach, MD       Or    ondansetron (ZOFRAN) injection 4 mg  4 mg Intravenous Q6H PRN Gala Romney L, MD   4 mg at 07/14/21 1504   pantoprazole (PROTONIX) EC tablet 40 mg  40 mg Oral Daily Fritzi Mandes, MD   40 mg at 07/14/21 1717   piperacillin-tazobactam (ZOSYN) IVPB 3.375 g  3.375 g Intravenous Selena Lesser, MD 12.5 mL/hr at 07/14/21 1509 3.375 g at 07/14/21 1509     Abtx:  Anti-infectives (From admission, onward)    Start     Dose/Rate Route Frequency Ordered Stop   07/14/21 2200  Ampicillin-Sulbactam (UNASYN) 3 g in sodium chloride 0.9 % 100 mL IVPB  Status:  Discontinued        3 g 200 mL/hr over 30 Minutes Intravenous Every 6 hours 07/14/21 1331 07/14/21 1456   07/14/21 1500  piperacillin-tazobactam (ZOSYN) IVPB 3.375 g        3.375 g 12.5 mL/hr over 240 Minutes Intravenous Every 8 hours 07/14/21 1456     07/14/21 1200  cefTRIAXone (ROCEPHIN) 2 g in sodium chloride 0.9 % 100 mL IVPB  Status:  Discontinued        2 g 200 mL/hr over 30 Minutes Intravenous Every 24 hours 07/14/21 0724 07/14/21 1323   07/14/21 1000  metroNIDAZOLE (FLAGYL) IVPB 500 mg  Status:  Discontinued        500 mg 100 mL/hr over 60 Minutes Intravenous Every 12 hours 07/14/21 0724 07/14/21 1323   07/13/21 2145  piperacillin-tazobactam (ZOSYN) IVPB 3.375 g        3.375 g 100 mL/hr over 30 Minutes Intravenous  Once 07/13/21 2130 07/13/21 2300   07/13/21 2145  cefTRIAXone (ROCEPHIN) 1 g in sodium chloride 0.9 % 100 mL IVPB        1 g 200 mL/hr over 30 Minutes Intravenous  Once 07/13/21 2130 07/13/21 2227       REVIEW OF SYSTEMS:  Const: fever,  chills, negative weight  loss Eyes: negative diplopia or visual changes, negative eye pain ENT: negative coryza, negative sore throat Resp: negative cough, hemoptysis, dyspnea Cards: negative for chest pain, palpitations, lower extremity edema GU: negative for frequency, dysuria and hematuria GI: abdominal pain, diarrhea,  Skin: negative for rash and pruritus Heme: negative for easy  bruising and gum/nose bleeding MS: Generalized weakness Neurolo:negative for headaches, dizziness, vertigo, memory problems  Psych: negative for feelings of anxiety, depression  Endocrine: negative for thyroid, diabetes Allergy/Immunology-as above Objective:  VITALS:  Patient Vitals for the past 24 hrs:  BP Temp Temp src Pulse Resp SpO2 Height Weight  07/14/21 2159 140/66 -- -- 95 -- -- -- --  07/14/21 2101 (!) 107/52 99.2 F (37.3 C) Oral 98 20 91 % -- --  07/14/21 1921 (!) 124/57 100.1 F (37.8 C) Oral (!) 107 20 90 % -- --  07/14/21 1724 137/66 99.6 F (37.6 C) Oral (!) 114 -- 91 % -- --  07/14/21 1533 -- 99.8 F (37.7 C) -- -- -- -- -- --  07/14/21 1445 (!) 169/63 (!) 103 F (39.4 C) -- (!) 107 19 95 % -- --  07/14/21 1352 -- (!) 100.8 F (38.2 C) -- -- -- -- -- --  07/14/21 0730 132/62 98.4 F (36.9 C) Oral 78 18 93 % -- --  07/14/21 0406 (!) 154/64 98.1 F (36.7 C) Oral 97 20 97 % -- --  07/14/21 0351 (!) 146/73 100.2 F (37.9 C) Oral 92 (!) 21 96 % -- --  07/13/21 2354 (!) 136/51 97.6 F (36.4 C) Oral 65 16 95 % '5\' 6"'$  (1.676 m) 104.9 kg  07/13/21 2300 (!) 124/46 -- -- 64 15 93 % -- --     PHYSICAL EXAM:  General: Alert, cooperative, no distress, appears stated age.  Head: Normocephalic, without obvious abnormality, atraumatic. Eyes: Conjunctivae clear, anicteric sclerae. Pupils are equal ENT Nares normal. No drainage or sinus tenderness. Lips, mucosa, and tongue normal. No Thrush Neck: Supple, symmetrical, no adenopathy, thyroid: non tender no carotid bruit and no JVD. Back: No CVA tenderness. Lungs: Clear to auscultation bilaterally. No Wheezing or Rhonchi. No rales. Heart: Regular rate and rhythm, no murmur, rub or gallop. Abdomen: Soft, non-tender,not distended. Bowel sounds normal. No masses Extremities: atraumatic, no cyanosis. No edema. No clubbing Skin: No rashes or lesions. Or bruising Lymph: Cervical, supraclavicular normal. Neurologic: Grossly  non-focal Pertinent Labs Lab Results CBC    Component Value Date/Time   WBC 8.8 07/14/2021 0717   RBC 3.53 (L) 07/14/2021 0717   HGB 11.4 (L) 07/14/2021 0717   HGB 14.0 07/04/2021 1605   HCT 33.1 (L) 07/14/2021 0717   HCT 40.0 07/04/2021 1605   PLT 217 07/14/2021 0717   PLT 120 (L) 07/04/2021 1605   MCV 93.8 07/14/2021 0717   MCV 95 07/04/2021 1605   MCH 32.3 07/14/2021 0717   MCHC 34.4 07/14/2021 0717   RDW 13.1 07/14/2021 0717   RDW 13.4 07/04/2021 1605   LYMPHSABS 1.6 07/13/2021 2007   LYMPHSABS 2.2 07/04/2021 1605   MONOABS 1.2 (H) 07/13/2021 2007   EOSABS 0.0 07/13/2021 2007   EOSABS 0.1 07/04/2021 1605   BASOSABS 0.0 07/13/2021 2007   BASOSABS 0.0 07/04/2021 1605       Latest Ref Rng & Units 07/14/2021    7:17 AM 07/13/2021    8:07 PM 07/12/2021    3:18 AM  CMP  Glucose 70 - 99 mg/dL 119   208   101    BUN 8 -  23 mg/dL '14   17   15    '$ Creatinine 0.61 - 1.24 mg/dL 0.59   0.74   0.83    Sodium 135 - 145 mmol/L 138   135   137    Potassium 3.5 - 5.1 mmol/L 3.5   3.8   4.2    Chloride 98 - 111 mmol/L 106   104   105    CO2 22 - 32 mmol/L '27   23   27    '$ Calcium 8.9 - 10.3 mg/dL 8.8   8.9   8.9    Total Protein 6.5 - 8.1 g/dL 6.1   7.0   6.3    Total Bilirubin 0.3 - 1.2 mg/dL 0.8   1.4   1.4    Alkaline Phos 38 - 126 U/L 173   218   240    AST 15 - 41 U/L 47   65   173    ALT 0 - 44 U/L 100   127   217        Microbiology: Recent Results (from the past 240 hour(s))  Culture, blood (Routine X 2) w Reflex to ID Panel     Status: None (Preliminary result)   Collection Time: 07/11/21  3:51 PM   Specimen: BLOOD  Result Value Ref Range Status   Specimen Description BLOOD BLOOD LEFT HAND  Final   Special Requests   Final    BOTTLES DRAWN AEROBIC AND ANAEROBIC Blood Culture adequate volume   Culture   Final    NO GROWTH 3 DAYS Performed at [facility], [address], [place], [place] [address]    Report Status PENDING  Incomplete  Culture, blood  (Routine X 2) w Reflex to ID Panel     Status: None (Preliminary result)   Collection Time: 07/11/21  4:04 PM   Specimen: BLOOD  Result Value Ref Range Status   Specimen Description BLOOD BLOOD RIGHT HAND  Final   Special Requests   Final    BOTTLES DRAWN AEROBIC AND ANAEROBIC Blood Culture adequate volume   Culture   Final    NO GROWTH 3 DAYS Performed at [facility], [address] [place], [place] [address]    Report Status PENDING  Incomplete  Blood culture (routine x 2)     Status: None (Preliminary result)   Collection Time: 07/13/21  8:07 PM   Specimen: BLOOD  Result Value Ref Range Status   Specimen Description BLOOD RIGHT HAND  Final   Special Requests   Final    BOTTLES DRAWN AEROBIC AND ANAEROBIC Blood Culture adequate volume   Culture   Final    NO GROWTH < 12 HOURS Performed at [facility], [address] [place], [place] [address]    Report Status PENDING  Incomplete  Blood culture (routine x 2)     Status: None (Preliminary result)   Collection Time: 07/13/21  9:33 PM   Specimen: BLOOD  Result Value Ref Range Status   Specimen Description BLOOD RIGHT FA  Final   Special Requests   Final    BOTTLES DRAWN AEROBIC AND ANAEROBIC Blood Culture adequate volume   Culture   Final    NO GROWTH < 12 HOURS Performed at [facility], [address] [place], [place] [address]    Report Status PENDING  Incomplete    IMAGING RESULTS: CT abdomen and pelvis reviewed Mildly heterogeneous appearance of the liver with tiny less than 5  mm lesions.  The appearance is similar to findings on the recent MRI and consistent with inflammatory changes.  Small abscesses cannot be excluded.:   I have personally reviewed the films ? Impression/Recommendation ? Ascending cholangitis secondary to CBD obstruction  Underwent ERCP on 07/10/2021 with biliary sphincterectomy and removal of stones and sludge Fever intermittently present since 2 weeks   Liver  abscess is a concern Will discuss with radiologist tomorrow regarding the  CT scan which was done today If liver abscess in question then he is going to need IV antibiotics for a prolonged period of time at least 4 weeks. Currently on Zosyn   Cholecystectomy in January 2020 Is unclear why patient continues to have bile duct stones and about presentation. May need to rule out other causes  Coronary artery disease status post CABG  Status post TAVR No recent blood cultures  positive from 6-2/23 or 07/13/2021  History of Enterococcus bacteremia in 2020 and was treated   ___________________________________________________ Discussed with patient, and his wife at bedside and requesting provider Note:  This document was prepared using Dragon voice recognition software and may include unintentional dictation errors.

## 2021-07-14 NOTE — Progress Notes (Addendum)
[facility] [place] NAME: Jacob Montes    MR#:  [id number]  DATE OF BIRTH:  February 26, 1940  SUBJECTIVE:  son at bedside patient was discharged June 3 at home with diagnosis of acute cholangitis and to finish up oral antibiotic as outpatient. He started having fever high-grade anywhere from 99 2103 over the weekend. He came in yesterday with high-grade fever was readmitted.  Patient states he ate well. No vomiting. Denies any abdominal discomfort.  VITALS:  Blood pressure (!) 169/63, pulse (!) 107, temperature 99.8 F (37.7 C), resp. rate 19, height '5\' 6"'$  (1.676 m), weight 104.9 kg, SpO2 95 %.  PHYSICAL EXAMINATION:   GENERAL:  81 y.o.-year-old patient lying in the bed with no acute distress. Obese LUNGS: Normal breath sounds bilaterally, no wheezing, rales, rhonchi.  CARDIOVASCULAR: S1, S2 normal. No murmurs, rubs, or gallops.  ABDOMEN: Soft, abd obesity Bowel sounds present.  EXTREMITIES: No  edema b/l.    NEUROLOGIC: nonfocal  patient is alert and awake SKIN: No obvious rash, lesion, or ulcer.   LABORATORY PANEL:  CBC Recent Labs  Lab 07/14/21 0717  WBC 8.8  HGB 11.4*  HCT 33.1*  PLT 217    Chemistries  Recent Labs  Lab 07/12/21 0318 07/13/21 2007 07/14/21 0717  NA 137   < > 138  K 4.2   < > 3.5  CL 105   < > 106  CO2 27   < > 27  GLUCOSE 101*   < > 119*  BUN 15   < > 14  CREATININE 0.83   < > 0.59*  CALCIUM 8.9   < > 8.8*  MG 2.1  --   --   AST 173*   < > 47*  ALT 217*   < > 100*  ALKPHOS 240*   < > 173*  BILITOT 1.4*   < > 0.8   < > = values in this interval not displayed.   Cardiac Enzymes No results for input(s): TROPONINI in the last 168 hours. RADIOLOGY:  DG Chest 2 View  Result Date: 07/13/2021 CLINICAL DATA:  Fever. EXAM: CHEST - 2 VIEW COMPARISON:  Radiograph 04/22/2021 FINDINGS: Post median sternotomy and TAVR. Stable mild cardiomegaly. Unchanged mediastinal contours. No focal airspace disease or pleural  effusion. No pulmonary edema. No pneumothorax. Thoracic spondylosis. IMPRESSION: Stable mild cardiomegaly. No acute abnormality. Electronically Signed   By: Keith Rake M.D.   On: 07/13/2021 20:26   US ABDOMEN LIMITED RUQ (LIVER/GB)  Result Date: 07/13/2021 CLINICAL DATA:  Fever recent ERCP EXAM: ULTRASOUND ABDOMEN LIMITED RIGHT UPPER QUADRANT COMPARISON:  Ultrasound 05/05/2021, MRI 07/08/2021 FINDINGS: Gallbladder: Surgically absent Common bile duct: Diameter: 3.5 mm Liver: Echogenicity within normal limits. Linear echogenic areas within the right hepatic lobe with dirty shadowing suspected to represent air in the biliary tree. Portal vein is patent on color Doppler imaging with normal direction of blood flow towards the liver. Other: None. IMPRESSION: 1. Linear echogenic areas in the right hepatic lobe with features suggesting air in the biliary system potentially related to history of recent ERCP 2. Status post cholecystectomy Electronically Signed   By: Donavan Foil M.D.   On: 07/13/2021 22:02    Assessment and Plan   Jacob Montes is a 81 y.o. male with PMH of CAD, AS sp TAVR, HTN, HLD, BPH, GERD and obesity who was discharged yesterday after admission on 5/30. He came in with fever, epigastric and RUQ pain, nausea and vomiting, and  admitted for acute cholangitis.  He had elevated LFT.  MRCP (5/30)showed vague areas of heterogeneous enhancement with concern for possible pseudo lesion, hepatic abscess or mass.  Patient was started on IV Zosyn.  GI consulted.   Patient underwent ERCP (07/10/21) with removal of choledocholithiasis and sludge by biliary sphincterotomy and balloon extraction on 6/1. Pt had post ERCP acute hepatitis and isolated fever the next day.  Blood cultures negative. He was discharged on PO Augmentin at home  febrile illness  suspected acute cholangitis/ ?liver abscess -- readmitted within 24 hours due to high-grade fever at home -- G.I. consultation dr Marius Ditch obtained.  Recommends CT scan of the abdomen with contrast -- ID consultation for recurrent fever -- IV Zosyn -- lactic acid normal -- white count 11.2--- 8.8 -- UA negative for UTI -- blood culture from 6/4-- so far negative  elevated LFTs shows improvement then last discharge date -- continue to monitor --pt recently had ERCP with stone removal  Jacob Montes -- PPI  Hypertension -- amlodipine  Procedures: Family communication :son at bedside Consults : G.I., ID CODE STATUS: full DVT Prophylaxis : Lovenox Level of care: Telemetry Medical Status is: Inpatient Remains inpatient appropriate because: febrile illness-- trying to find other causes for fever. ID consultation pending    TOTAL TIME TAKING CARE OF THIS PATIENT: 35 minutes.  >50% time spent on counselling and coordination of care  Note: This dictation was prepared with Dragon dictation along with smaller phrase technology. Any transcriptional errors that result from this process are unintentional.  Fritzi Mandes M.D    [facility]   CC: Primary care physician; Jerrol Banana., MD

## 2021-07-15 ENCOUNTER — Encounter: Payer: Self-pay | Admitting: Internal Medicine

## 2021-07-15 DIAGNOSIS — K750 Abscess of liver: Secondary | ICD-10-CM | POA: Diagnosis present

## 2021-07-15 DIAGNOSIS — K8309 Other cholangitis: Secondary | ICD-10-CM | POA: Diagnosis not present

## 2021-07-15 LAB — CBC
HCT: 33.7 % — ABNORMAL LOW (ref 39.0–52.0)
Hemoglobin: 11.6 g/dL — ABNORMAL LOW (ref 13.0–17.0)
MCH: 32.9 pg (ref 26.0–34.0)
MCHC: 34.4 g/dL (ref 30.0–36.0)
MCV: 95.5 fL (ref 80.0–100.0)
Platelets: 229 10*3/uL (ref 150–400)
RBC: 3.53 MIL/uL — ABNORMAL LOW (ref 4.22–5.81)
RDW: 13.2 % (ref 11.5–15.5)
WBC: 11.2 10*3/uL — ABNORMAL HIGH (ref 4.0–10.5)
nRBC: 0 % (ref 0.0–0.2)

## 2021-07-15 MED ORDER — METOPROLOL TARTRATE 25 MG PO TABS
12.5000 mg | ORAL_TABLET | Freq: Two times a day (BID) | ORAL | Status: DC
  Administered 2021-07-15 – 2021-07-22 (×15): 12.5 mg via ORAL
  Filled 2021-07-15 (×15): qty 1

## 2021-07-15 NOTE — Progress Notes (Signed)
Progress Note    Jacob Montes  ZDG:[id number] DOB: 1940/07/16  DOA: 07/13/2021 PCP: Jerrol Banana., MD      Brief Narrative:    Medical records reviewed and are as summarized below:  Jacob Montes is a 81 y.o. male with medical history significant for CAD, aortic stenosis s/p TAVR, hypertension, hyperlipidemia, BPH, CAD, obesity, recent discharge from the hospital on 07/12/2021 after hospitalization on 07/08/2021 for acute cholangitis with choledocholithiasis s/p ERCP.  He presented to the hospital with fever despite being on oral antibiotics since discharge from the hospital. CT abdomen and pelvis was concerning for liver abscesses.     Assessment/Plan:   Principal Problem:   Liver abscess Active Problems:   Elevated liver enzymes   History of CAD   Morbid obesity (HCC)   Benign essential hypertension   Ascending cholangitis   Hyperlipidemia   CKD (chronic kidney disease)    Body mass index is 37.33 kg/m.  (Obesity)  Liver abscesses: Continue IV Zosyn.  Discontinue IV fluids.  Follow-up with ID for further recommendations.  Recent acute cholangitis, choledocholithiasis: S/p ERCP on 07/10/2021 with stone extraction.  Appreciate input from GI.  GI has signed off.  Elevated liver enzymes: Improved.  Other comorbidities include hypertension, GERD   Diet Order             Diet Heart Room service appropriate? Yes; Fluid consistency: Thin  Diet effective now                            Consultants: Gastroenterologist, infectious disease  Procedures: None    Medications:    amLODipine  10 mg Oral Daily   enoxaparin (LOVENOX) injection  0.5 mg/kg Subcutaneous Q24H   metoprolol tartrate  12.5 mg Oral BID   multivitamin with minerals  1 tablet Oral Daily   pantoprazole  40 mg Oral Daily   Continuous Infusions:  piperacillin-tazobactam 3.375 g (07/15/21 1515)     Anti-infectives (From admission, onward)    Start     Dose/Rate Route  Frequency Ordered Stop   07/14/21 2200  Ampicillin-Sulbactam (UNASYN) 3 g in sodium chloride 0.9 % 100 mL IVPB  Status:  Discontinued        3 g 200 mL/hr over 30 Minutes Intravenous Every 6 hours 07/14/21 1331 07/14/21 1456   07/14/21 1500  piperacillin-tazobactam (ZOSYN) IVPB 3.375 g        3.375 g 12.5 mL/hr over 240 Minutes Intravenous Every 8 hours 07/14/21 1456     07/14/21 1200  cefTRIAXone (ROCEPHIN) 2 g in sodium chloride 0.9 % 100 mL IVPB  Status:  Discontinued        2 g 200 mL/hr over 30 Minutes Intravenous Every 24 hours 07/14/21 0724 07/14/21 1323   07/14/21 1000  metroNIDAZOLE (FLAGYL) IVPB 500 mg  Status:  Discontinued        500 mg 100 mL/hr over 60 Minutes Intravenous Every 12 hours 07/14/21 0724 07/14/21 1323   07/13/21 2145  piperacillin-tazobactam (ZOSYN) IVPB 3.375 g        3.375 g 100 mL/hr over 30 Minutes Intravenous  Once 07/13/21 2130 07/13/21 2300   07/13/21 2145  cefTRIAXone (ROCEPHIN) 1 g in sodium chloride 0.9 % 100 mL IVPB        1 g 200 mL/hr over 30 Minutes Intravenous  Once 07/13/21 2130 07/13/21 2227              Family  Communication/Anticipated D/C date and plan/Code Status   DVT prophylaxis:      Code Status: Full Code  Family Communication: Plan discussed with his wife at the bedside Disposition Plan: Plan to discharge home in 1 to 2 days   Status is: Inpatient Remains inpatient appropriate because: IV antibiotics       Subjective:   Interval events noted.  He feels better today.  No abdominal pain, nausea or vomiting.  Objective:    Vitals:   07/15/21 0345 07/15/21 0610 07/15/21 0755 07/15/21 1514  BP:   (!) 125/42 (!) 127/46  Pulse:   76 64  Resp:   18 18  Temp:  98.6 F (37 C) 98.3 F (36.8 C) 99.1 F (37.3 C)  TempSrc:  Oral Oral Oral  SpO2: 92%  92% 93%  Weight:      Height:       No data found.   Intake/Output Summary (Last 24 hours) at 07/15/2021 1519 Last data filed at 07/15/2021 1024 Gross per 24  hour  Intake 1354.69 ml  Output --  Net 1354.69 ml   Filed Weights   07/13/21 1956 07/13/21 2354  Weight: 104.8 kg 104.9 kg    Exam:  GEN: NAD SKIN: No rash EYES: EOMI ENT: MMM CV: RRR PULM: CTA B ABD: soft, obese, NT, +BS CNS: AAO x 3, non focal EXT: No edema or tenderness        Data Reviewed:   I have personally reviewed following labs and imaging studies:  Labs: Labs show the following:   Basic Metabolic Panel: Recent Labs  Lab 07/10/21 0519 07/11/21 0501 07/12/21 0318 07/13/21 2007 07/14/21 0717  NA 138 138 137 135 138  K 4.3 4.1 4.2 3.8 3.5  CL 105 107 105 104 106  CO2 '27 27 27 23 27  '$ GLUCOSE 112* 101* 101* 208* 119*  BUN '16 18 15 17 14  '$ CREATININE 0.72 0.65 0.83 0.74 0.59*  CALCIUM 8.9 8.7* 8.9 8.9 8.8*  MG 1.8 2.0 2.1  --   --   PHOS 3.5 3.8 3.1  --   --    GFR Estimated Creatinine Clearance: 83.5 mL/min (A) (by C-G formula based on SCr of 0.59 mg/dL (L)). Liver Function Tests: Recent Labs  Lab 07/11/21 0501 07/11/21 1551 07/12/21 0318 07/13/21 2007 07/14/21 0717  AST 437*  476* 298* 173* 65* 47*  ALT 321*  322* 296* 217* 127* 100*  ALKPHOS 251*  265* 291* 240* 218* 173*  BILITOT 3.3*  3.6* 1.7* 1.4* 1.4* 0.8  PROT 5.9*  6.0* 6.9 6.3* 7.0 6.1*  ALBUMIN 2.9*  2.9* 3.2* 3.0* 3.3* 2.9*   Recent Labs  Lab 07/11/21 0501 07/12/21 0318  LIPASE 16 16   No results for input(s): AMMONIA in the last 168 hours. Coagulation profile No results for input(s): INR, PROTIME in the last 168 hours.  CBC: Recent Labs  Lab 07/10/21 0519 07/11/21 0501 07/13/21 2007 07/14/21 0717 07/15/21 0503  WBC 10.1 5.5 11.2* 8.8 11.2*  NEUTROABS  --   --  8.4*  --   --   HGB 12.7* 11.8* 12.7* 11.4* 11.6*  HCT 37.4* 34.3* 35.3* 33.1* 33.7*  MCV 93.7 93.5 94.6 93.8 95.5  PLT 181 179 246 217 229   Cardiac Enzymes: No results for input(s): CKTOTAL, CKMB, CKMBINDEX, TROPONINI in the last 168 hours. BNP (last 3 results) No results for input(s):  PROBNP in the last 8760 hours. CBG: No results for input(s): GLUCAP in the last 168 hours.  D-Dimer: No results for input(s): DDIMER in the last 72 hours. Hgb A1c: No results for input(s): HGBA1C in the last 72 hours. Lipid Profile: No results for input(s): CHOL, HDL, LDLCALC, TRIG, CHOLHDL, LDLDIRECT in the last 72 hours. Thyroid function studies: No results for input(s): TSH, T4TOTAL, T3FREE, THYROIDAB in the last 72 hours.  Invalid input(s): FREET3 Anemia work up: No results for input(s): VITAMINB12, FOLATE, FERRITIN, TIBC, IRON, RETICCTPCT in the last 72 hours. Sepsis Labs: Recent Labs  Lab 07/08/21 2017 07/09/21 0531 07/11/21 0501 07/13/21 2007 07/14/21 0717 07/15/21 0503  WBC  --    < > 5.5 11.2* 8.8 11.2*  LATICACIDVEN 1.5  --   --  1.7  --   --    < > = values in this interval not displayed.    Microbiology Recent Results (from the past 240 hour(s))  Culture, blood (Routine X 2) w Reflex to ID Panel     Status: None (Preliminary result)   Collection Time: 07/11/21  3:51 PM   Specimen: BLOOD  Result Value Ref Range Status   Specimen Description BLOOD BLOOD LEFT HAND  Final   Special Requests   Final    BOTTLES DRAWN AEROBIC AND ANAEROBIC Blood Culture adequate volume   Culture   Final    NO GROWTH 4 DAYS Performed at [facility], [address], [place], [place] [address]    Report Status PENDING  Incomplete  Culture, blood (Routine X 2) w Reflex to ID Panel     Status: None (Preliminary result)   Collection Time: 07/11/21  4:04 PM   Specimen: BLOOD  Result Value Ref Range Status   Specimen Description BLOOD BLOOD RIGHT HAND  Final   Special Requests   Final    BOTTLES DRAWN AEROBIC AND ANAEROBIC Blood Culture adequate volume   Culture   Final    NO GROWTH 4 DAYS Performed at [facility], [address] [place], [place] [address]    Report Status PENDING  Incomplete  Blood culture (routine x 2)     Status: None (Preliminary result)    Collection Time: 07/13/21  8:07 PM   Specimen: BLOOD  Result Value Ref Range Status   Specimen Description BLOOD RIGHT HAND  Final   Special Requests   Final    BOTTLES DRAWN AEROBIC AND ANAEROBIC Blood Culture adequate volume   Culture   Final    NO GROWTH 2 DAYS Performed at [facility], [address] [place], [place] [address]    Report Status PENDING  Incomplete  Blood culture (routine x 2)     Status: None (Preliminary result)   Collection Time: 07/13/21  9:33 PM   Specimen: BLOOD  Result Value Ref Range Status   Specimen Description BLOOD RIGHT FA  Final   Special Requests   Final    BOTTLES DRAWN AEROBIC AND ANAEROBIC Blood Culture adequate volume   Culture   Final    NO GROWTH 2 DAYS Performed at [facility], [address], [place], [place] [address]    Report Status PENDING  Incomplete    Procedures and diagnostic studies:  DG Chest 2 View  Result Date: 07/13/2021 CLINICAL DATA:  Fever. EXAM: CHEST - 2 VIEW COMPARISON:  Radiograph 04/22/2021 FINDINGS: Post median sternotomy and TAVR. Stable mild cardiomegaly. Unchanged mediastinal contours. No focal airspace disease or pleural effusion. No pulmonary edema. No pneumothorax. Thoracic spondylosis. IMPRESSION: Stable mild cardiomegaly. No acute abnormality. Electronically Signed   By: Threasa Beards  Sanford M.D.   On: 07/13/2021 20:26   CT ABDOMEN PELVIS W CONTRAST  Result Date: 07/14/2021 CLINICAL DATA:  MD NOTE; son at bedside patient was discharged June 3 at home with diagnosis of acute cholangitis and to finish up oral antibiotic as outpatient. He started having fever high-grade fever over the weekend. EXAM: CT ABDOMEN AND PELVIS WITH CONTRAST TECHNIQUE: Multidetector CT imaging of the abdomen and pelvis was performed using the standard protocol following bolus administration of intravenous contrast. RADIATION DOSE REDUCTION: This exam was performed according to the departmental dose-optimization program  which includes automated exposure control, adjustment of the mA and/or kV according to patient size and/or use of iterative reconstruction technique. CONTRAST:  12m OMNIPAQUE IOHEXOL 300 MG/ML  SOLN COMPARISON:  MRI on 07/08/2021, ultrasound of the abdomen on 07/13/2021, CT of the abdomen and pelvis on 11/15/2020 FINDINGS: Lower chest: Lung bases are unremarkable. Partially imaged aortic valve replacement. Hepatobiliary: Prior cholecystectomy. Liver contour is normal. There is mildly heterogeneous appearance of the dome of the LEFT hepatic lobe, with numerous subtle low-attenuation areas measuring 5 millimeters or smaller. Pancreas: Unremarkable. No pancreatic ductal dilatation or surrounding inflammatory changes. Spleen: Normal in size without focal abnormality. Adrenals/Urinary Tract: Adrenal glands are normal. Kidneys are symmetric in size and enhancement. Tiny low-attenuation lesion in the RIGHT UPPER pole region is consistent with small cyst based on Hounsfield unit criteria. Punctate intrarenal calculus identified in the UPPER pole of the LEFT kidney. The ureters are unremarkable without evidence for urinary tract obstruction. The bladder and visualized portion of the urethra are normal. Stomach/Bowel: Stomach and small bowel loops are normal in appearance. There are numerous colonic diverticula. No acute diverticulitis. The appendix is well seen and normal in appearance. Vascular/Lymphatic: There is atherosclerotic calcification of the abdominal aorta. No associated aneurysm. Portacaval lymph node is 1.1 centimeters on image 29 of series 2. RIGHT external iliac node is 0.8 centimeters on image 77 of series 2. Reproductive: Prostate is unremarkable. Other: Fat containing bilateral inguinal hernias noted. There is focal subcutaneous gas in the LEFT anterior abdominal wall, consistent with injection site. Musculoskeletal: There are degenerative changes primarily in the LOWER lumbar spine and at the lumbosacral  junction. No lytic or blastic lesions. Previous median sternotomy. IMPRESSION: 1. Mildly heterogeneous appearance of the liver, with tiny, less than 5 millimeter lesions. The appearance is similar to findings on recent MRI and consistent with inflammatory changes. Small abscesses cannot be excluded. 2. Cholecystectomy. 3. Portacaval lymph node is 1.1 centimeters, likely reactive. 4. Possible nonobstructing intrarenal calculus in the UPPER pole of the LEFT kidney. 5. Colonic diverticulosis. 6.  Aortic atherosclerosis.  (ICD10-I70.0) 7. Bilateral fat containing inguinal hernias. Electronically Signed   By: ENolon NationsM.D.   On: 07/14/2021 17:14   UKoreaABDOMEN LIMITED RUQ (LIVER/GB)  Result Date: 07/13/2021 CLINICAL DATA:  Fever recent ERCP EXAM: ULTRASOUND ABDOMEN LIMITED RIGHT UPPER QUADRANT COMPARISON:  Ultrasound 05/05/2021, MRI 07/08/2021 FINDINGS: Gallbladder: Surgically absent Common bile duct: Diameter: 3.5 mm Liver: Echogenicity within normal limits. Linear echogenic areas within the right hepatic lobe with dirty shadowing suspected to represent air in the biliary tree. Portal vein is patent on color Doppler imaging with normal direction of blood flow towards the liver. Other: None. IMPRESSION: 1. Linear echogenic areas in the right hepatic lobe with features suggesting air in the biliary system potentially related to history of recent ERCP 2. Status post cholecystectomy Electronically Signed   By: KDonavan FoilM.D.   On: 07/13/2021 22:02  LOS: 2 days   Caileen Veracruz  [facility]   Pager on [email or web address]. If 7PM-7AM, please contact night-coverage at [email or web address]     07/15/2021, 3:19 PM

## 2021-07-15 NOTE — Progress Notes (Signed)
[name], MD [facility]   [address] [place] [address], [place], [place] [address] Phone: [phone] Fax : [phone]   Subjective: The patient states that he is feeling much better today.  The patient had an ERCP with stone extraction and a large sphincterotomy.  There is no retained stones at the time of the ERCP.  The patient did have multiple lesions in the liver consistent with small abscesses.   Objective: Vital signs in last 24 hours: Vitals:   07/15/21 0327 07/15/21 0345 07/15/21 0610 07/15/21 0755  BP: (!) 136/55   (!) 125/42  Pulse: 86   76  Resp: 20   18  Temp: 99.3 F (37.4 C)  98.6 F (37 C) 98.3 F (36.8 C)  TempSrc: Oral  Oral Oral  SpO2: (!) 88% 92%  92%  Weight:      Height:       Weight change:   Intake/Output Summary (Last 24 hours) at 07/15/2021 1137 Last data filed at 07/15/2021 1024 Gross per 24 hour  Intake 1354.69 ml  Output --  Net 1354.69 ml     Exam: General: Patient sitting up in a chair in no apparent distress. Alert and oriented x3  Lab Results: '@LABTEST2'$ @ Micro Results: Recent Results (from the past 240 hour(s))  Culture, blood (Routine X 2) w Reflex to ID Panel     Status: None (Preliminary result)   Collection Time: 07/11/21  3:51 PM   Specimen: BLOOD  Result Value Ref Range Status   Specimen Description BLOOD BLOOD LEFT HAND  Final   Special Requests   Final    BOTTLES DRAWN AEROBIC AND ANAEROBIC Blood Culture adequate volume   Culture   Final    NO GROWTH 4 DAYS Performed at [facility], [address] [place], [place] [address]    Report Status PENDING  Incomplete  Culture, blood (Routine X 2) w Reflex to ID Panel     Status: None (Preliminary result)   Collection Time: 07/11/21  4:04 PM   Specimen: BLOOD  Result Value Ref Range Status   Specimen Description BLOOD BLOOD RIGHT HAND  Final   Special Requests   Final    BOTTLES DRAWN AEROBIC AND ANAEROBIC Blood Culture adequate volume   Culture   Final    NO GROWTH  4 DAYS Performed at [facility], [address] [place], [place] [address]    Report Status PENDING  Incomplete  Blood culture (routine x 2)     Status: None (Preliminary result)   Collection Time: 07/13/21  8:07 PM   Specimen: BLOOD  Result Value Ref Range Status   Specimen Description BLOOD RIGHT HAND  Final   Special Requests   Final    BOTTLES DRAWN AEROBIC AND ANAEROBIC Blood Culture adequate volume   Culture   Final    NO GROWTH 2 DAYS Performed at [facility], [address] [place], [place] [address]    Report Status PENDING  Incomplete  Blood culture (routine x 2)     Status: None (Preliminary result)   Collection Time: 07/13/21  9:33 PM   Specimen: BLOOD  Result Value Ref Range Status   Specimen Description BLOOD RIGHT FA  Final   Special Requests   Final    BOTTLES DRAWN AEROBIC AND ANAEROBIC Blood Culture adequate volume   Culture   Final    NO GROWTH 2 DAYS Performed at [facility], [address], [place], [place] [address]    Report  Status PENDING  Incomplete   Studies/Results: DG Chest 2 View  Result Date: 07/13/2021 CLINICAL DATA:  Fever. EXAM: CHEST - 2 VIEW COMPARISON:  Radiograph 04/22/2021 FINDINGS: Post median sternotomy and TAVR. Stable mild cardiomegaly. Unchanged mediastinal contours. No focal airspace disease or pleural effusion. No pulmonary edema. No pneumothorax. Thoracic spondylosis. IMPRESSION: Stable mild cardiomegaly. No acute abnormality. Electronically Signed   By: Keith Rake M.D.   On: 07/13/2021 20:26   CT ABDOMEN PELVIS W CONTRAST  Result Date: 07/14/2021 CLINICAL DATA:  MD NOTE; son at bedside patient was discharged June 3 at home with diagnosis of acute cholangitis and to finish up oral antibiotic as outpatient. He started having fever high-grade fever over the weekend. EXAM: CT ABDOMEN AND PELVIS WITH CONTRAST TECHNIQUE: Multidetector CT imaging of the abdomen and pelvis was performed using the  standard protocol following bolus administration of intravenous contrast. RADIATION DOSE REDUCTION: This exam was performed according to the departmental dose-optimization program which includes automated exposure control, adjustment of the mA and/or kV according to patient size and/or use of iterative reconstruction technique. CONTRAST:  148m OMNIPAQUE IOHEXOL 300 MG/ML  SOLN COMPARISON:  MRI on 07/08/2021, ultrasound of the abdomen on 07/13/2021, CT of the abdomen and pelvis on 11/15/2020 FINDINGS: Lower chest: Lung bases are unremarkable. Partially imaged aortic valve replacement. Hepatobiliary: Prior cholecystectomy. Liver contour is normal. There is mildly heterogeneous appearance of the dome of the LEFT hepatic lobe, with numerous subtle low-attenuation areas measuring 5 millimeters or smaller. Pancreas: Unremarkable. No pancreatic ductal dilatation or surrounding inflammatory changes. Spleen: Normal in size without focal abnormality. Adrenals/Urinary Tract: Adrenal glands are normal. Kidneys are symmetric in size and enhancement. Tiny low-attenuation lesion in the RIGHT UPPER pole region is consistent with small cyst based on Hounsfield unit criteria. Punctate intrarenal calculus identified in the UPPER pole of the LEFT kidney. The ureters are unremarkable without evidence for urinary tract obstruction. The bladder and visualized portion of the urethra are normal. Stomach/Bowel: Stomach and small bowel loops are normal in appearance. There are numerous colonic diverticula. No acute diverticulitis. The appendix is well seen and normal in appearance. Vascular/Lymphatic: There is atherosclerotic calcification of the abdominal aorta. No associated aneurysm. Portacaval lymph node is 1.1 centimeters on image 29 of series 2. RIGHT external iliac node is 0.8 centimeters on image 77 of series 2. Reproductive: Prostate is unremarkable. Other: Fat containing bilateral inguinal hernias noted. There is focal subcutaneous  gas in the LEFT anterior abdominal wall, consistent with injection site. Musculoskeletal: There are degenerative changes primarily in the LOWER lumbar spine and at the lumbosacral junction. No lytic or blastic lesions. Previous median sternotomy. IMPRESSION: 1. Mildly heterogeneous appearance of the liver, with tiny, less than 5 millimeter lesions. The appearance is similar to findings on recent MRI and consistent with inflammatory changes. Small abscesses cannot be excluded. 2. Cholecystectomy. 3. Portacaval lymph node is 1.1 centimeters, likely reactive. 4. Possible nonobstructing intrarenal calculus in the UPPER pole of the LEFT kidney. 5. Colonic diverticulosis. 6.  Aortic atherosclerosis.  (ICD10-I70.0) 7. Bilateral fat containing inguinal hernias. Electronically Signed   By: ENolon NationsM.D.   On: 07/14/2021 17:14   [place]ABDOMEN LIMITED RUQ (LIVER/GB)  Result Date: 07/13/2021 CLINICAL DATA:  Fever recent ERCP EXAM: ULTRASOUND ABDOMEN LIMITED RIGHT UPPER QUADRANT COMPARISON:  Ultrasound 05/05/2021, MRI 07/08/2021 FINDINGS: Gallbladder: Surgically absent Common bile duct: Diameter: 3.5 mm Liver: Echogenicity within normal limits. Linear echogenic areas within the right hepatic lobe with dirty shadowing suspected to represent air in  the biliary tree. Portal vein is patent on color Doppler imaging with normal direction of blood flow towards the liver. Other: None. IMPRESSION: 1. Linear echogenic areas in the right hepatic lobe with features suggesting air in the biliary system potentially related to history of recent ERCP 2. Status post cholecystectomy Electronically Signed   By: Donavan Foil M.D.   On: 07/13/2021 22:02   Medications: I have reviewed the patient's current medications. Scheduled Meds:  amLODipine  10 mg Oral Daily   enoxaparin (LOVENOX) injection  0.5 mg/kg Subcutaneous Q24H   metoprolol tartrate  12.5 mg Oral BID   multivitamin with minerals  1 tablet Oral Daily   pantoprazole  40 mg  Oral Daily   Continuous Infusions:  sodium chloride 75 mL/hr at 07/15/21 0936   piperacillin-tazobactam 3.375 g (07/15/21 0609)   PRN Meds:.sodium chloride, acetaminophen, docusate sodium, HYDROmorphone (DILAUDID) injection, montelukast, ondansetron **OR** ondansetron (ZOFRAN) IV   Assessment: Principal Problem:   Acute cholangitis with choledocholithiasis Active Problems:   Benign essential hypertension   Ascending cholangitis   Hyperlipidemia   CKD (chronic kidney disease)   History of CAD   Morbid obesity (HCC)   Elevated liver enzymes    Plan: This patient is status post ERCP with a diagnosis of cholangitis although only stones were seen and no sign of any pus was seen coming out of the common bile duct.  The multiple stones were removed and the sphincterotomy was extended.  The patient continued to have symptoms consistent with his infection.  His bilirubin is normal therefore there is no further obstruction.  The patient is being followed by infectious disease.  Nothing further to do from a GI point of view.  I will sign off.  Please call if any further GI concerns or questions.  We would like to thank you for the opportunity to participate in the care of Jacob Montes.    LOS: 2 days   Lewayne Bunting 07/15/2021, 11:37 AM Pager [phone] 7am-5pm  Check AMION for 5pm -7am coverage and on weekends

## 2021-07-15 NOTE — Progress Notes (Signed)
ID Pt feeling much better No fever or chills Walked with PT No pain abdomen  Wife at bed side   O/e awake and alert, looks well Patient Vitals for the past 24 hrs:  BP Temp Temp src Pulse Resp SpO2  07/15/21 1514 (!) 127/46 99.1 F (37.3 C) Oral 64 18 93 %  07/15/21 0755 (!) 125/42 98.3 F (36.8 C) Oral 76 18 92 %  07/15/21 0610 -- 98.6 F (37 C) Oral -- -- --  07/15/21 0345 -- -- -- -- -- 92 %  07/15/21 0327 (!) 136/55 99.3 F (37.4 C) Oral 86 20 (!) 88 %  07/15/21 0100 -- (!) 100.4 F (38 C) -- -- -- --  07/14/21 2328 134/61 (!) 100.9 F (38.3 C) -- 86 20 93 %  07/14/21 2230 -- 100 F (37.8 C) Oral -- -- --  07/14/21 2159 140/66 -- -- 95 -- --  07/14/21 2101 (!) 107/52 99.2 F (37.3 C) Oral 98 20 91 %    Chest CTA Hss1s2 Abd soft Cns non focal  Labs    Latest Ref Rng & Units 07/15/2021    5:03 AM 07/14/2021    7:17 AM 07/13/2021    8:07 PM  CBC  WBC 4.0 - 10.5 K/uL 11.2   8.8   11.2    Hemoglobin 13.0 - 17.0 g/dL 11.6   11.4   12.7    Hematocrit 39.0 - 52.0 % 33.7   33.1   35.3    Platelets 150 - 400 K/uL 229   217   246         Latest Ref Rng & Units 07/14/2021    7:17 AM 07/13/2021    8:07 PM 07/12/2021    3:18 AM  CMP  Glucose 70 - 99 mg/dL 119   208   101    BUN 8 - 23 mg/dL '14   17   15    '$ Creatinine 0.61 - 1.24 mg/dL 0.59   0.74   0.83    Sodium 135 - 145 mmol/L 138   135   137    Potassium 3.5 - 5.1 mmol/L 3.5   3.8   4.2    Chloride 98 - 111 mmol/L 106   104   105    CO2 22 - 32 mmol/L '27   23   27    '$ Calcium 8.9 - 10.3 mg/dL 8.8   8.9   8.9    Total Protein 6.5 - 8.1 g/dL 6.1   7.0   6.3    Total Bilirubin 0.3 - 1.2 mg/dL 0.8   1.4   1.4    Alkaline Phos 38 - 126 U/L 173   218   240    AST 15 - 41 U/L 47   65   173    ALT 0 - 44 U/L 100   127   217      Micro BC NG  Impression/recommendation Ascending cholangitis secondary to CBD obstruction Underwent ERCP on 07/10/2021 with biliary sphincterotomy and removal of sludge and stones Intermittent  fever for the past 2 weeks  Liver abscesses small Discussed with radiology tomorrow and see whether it can be aspirated Currently on Zosyn Will need 4 weeks of antibiotics Decide whether to send him on Zosyn or IV ertapenem  Cholecystectomy in January 2022.  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

## 2021-07-15 NOTE — Plan of Care (Signed)
  Problem: Clinical Measurements: Goal: Ability to maintain clinical measurements within normal limits will improve Outcome: Progressing Goal: Will remain free from infection Outcome: Progressing Goal: Diagnostic test results will improve Outcome: Progressing Goal: Respiratory complications will improve Outcome: Progressing Goal: Cardiovascular complication will be avoided Outcome: Progressing   Problem: Pain Managment: Goal: General experience of comfort will improve Outcome: Progressing   Pt is involved in and agrees with the plan of care. Pt had a fever last night; tylenol given. HR improved now in 80s. No complaints of pain. Independent in her room.

## 2021-07-16 DIAGNOSIS — K8309 Other cholangitis: Secondary | ICD-10-CM | POA: Diagnosis not present

## 2021-07-16 DIAGNOSIS — K750 Abscess of liver: Secondary | ICD-10-CM | POA: Diagnosis not present

## 2021-07-16 LAB — COMPREHENSIVE METABOLIC PANEL
ALT: 72 U/L — ABNORMAL HIGH (ref 0–44)
AST: 52 U/L — ABNORMAL HIGH (ref 15–41)
Albumin: 3 g/dL — ABNORMAL LOW (ref 3.5–5.0)
Alkaline Phosphatase: 142 U/L — ABNORMAL HIGH (ref 38–126)
Anion gap: 5 (ref 5–15)
BUN: 14 mg/dL (ref 8–23)
CO2: 28 mmol/L (ref 22–32)
Calcium: 8.6 mg/dL — ABNORMAL LOW (ref 8.9–10.3)
Chloride: 103 mmol/L (ref 98–111)
Creatinine, Ser: 0.69 mg/dL (ref 0.61–1.24)
GFR, Estimated: >60 mL/min (ref >60)
Glucose, Bld: 107 mg/dL — ABNORMAL HIGH (ref 70–99)
Potassium: 3.9 mmol/L (ref 3.5–5.1)
Sodium: 136 mmol/L (ref 135–145)
Total Bilirubin: 0.6 mg/dL (ref 0.3–1.2)
Total Protein: 6.2 g/dL — ABNORMAL LOW (ref 6.5–8.1)

## 2021-07-16 LAB — CULTURE, BLOOD (ROUTINE X 2)
Culture: NO GROWTH
Culture: NO GROWTH
Special Requests: ADEQUATE
Special Requests: ADEQUATE

## 2021-07-16 LAB — CBC WITH DIFFERENTIAL/PLATELET
Abs Immature Granulocytes: 0.02 10*3/uL (ref 0.00–0.07)
Basophils Absolute: 0 10*3/uL (ref 0.0–0.1)
Basophils Relative: 0 %
Eosinophils Absolute: 0.2 10*3/uL (ref 0.0–0.5)
Eosinophils Relative: 3 %
HCT: 33.1 % — ABNORMAL LOW (ref 39.0–52.0)
Hemoglobin: 11.2 g/dL — ABNORMAL LOW (ref 13.0–17.0)
Immature Granulocytes: 0 %
Lymphocytes Relative: 27 %
Lymphs Abs: 2 10*3/uL (ref 0.7–4.0)
MCH: 32.5 pg (ref 26.0–34.0)
MCHC: 33.8 g/dL (ref 30.0–36.0)
MCV: 95.9 fL (ref 80.0–100.0)
Monocytes Absolute: 1.1 10*3/uL — ABNORMAL HIGH (ref 0.1–1.0)
Monocytes Relative: 14 %
Neutro Abs: 4 10*3/uL (ref 1.7–7.7)
Neutrophils Relative %: 56 %
Platelets: 242 10*3/uL (ref 150–400)
RBC: 3.45 MIL/uL — ABNORMAL LOW (ref 4.22–5.81)
RDW: 13.2 % (ref 11.5–15.5)
WBC: 7.3 10*3/uL (ref 4.0–10.5)
nRBC: 0 % (ref 0.0–0.2)

## 2021-07-16 LAB — PROCALCITONIN: Procalcitonin: 0.14 ng/mL

## 2021-07-16 NOTE — Progress Notes (Addendum)
Progress Note    Jacob Montes  KKX:[id number] DOB: 22-Mar-1940  DOA: 07/13/2021 PCP: Jerrol Banana., MD      Brief Narrative:    Medical records reviewed and are as summarized below:  Jacob Montes is a 81 y.o. male with medical history significant for CAD, aortic stenosis s/p TAVR, hypertension, hyperlipidemia, BPH, CAD, obesity, recent discharge from the hospital on 07/12/2021 after hospitalization on 07/08/2021 for acute cholangitis with choledocholithiasis s/p ERCP.  He presented to the hospital with fever despite being on oral antibiotics since discharge from the hospital. CT abdomen and pelvis was concerning for liver abscesses.     Assessment/Plan:   Principal Problem:   Liver abscess Active Problems:   Elevated liver enzymes   History of CAD   Morbid obesity (HCC)   Benign essential hypertension   Ascending cholangitis   Hyperlipidemia   CKD (chronic kidney disease)    Body mass index is 37.33 kg/m.  (Obesity)  Liver abscesses, fever: Continue IV Zosyn.  Follow-up with ID for further recommendations.  Monitor CBC.  Recent acute cholangitis, choledocholithiasis: S/p ERCP on 07/10/2021 with stone extraction.  Appreciate input from GI.  GI has signed off.  Elevated liver enzymes: Improved.  Other comorbidities include hypertension, GERD   Diet Order             Diet Heart Room service appropriate? Yes; Fluid consistency: Thin  Diet effective now                            Consultants: Gastroenterologist, infectious disease  Procedures: None    Medications:    amLODipine  10 mg Oral Daily   enoxaparin (LOVENOX) injection  0.5 mg/kg Subcutaneous Q24H   metoprolol tartrate  12.5 mg Oral BID   multivitamin with minerals  1 tablet Oral Daily   pantoprazole  40 mg Oral Daily   Continuous Infusions:  piperacillin-tazobactam 3.375 g (07/16/21 0615)     Anti-infectives (From admission, onward)    Start     Dose/Rate Route  Frequency Ordered Stop   07/14/21 2200  Ampicillin-Sulbactam (UNASYN) 3 g in sodium chloride 0.9 % 100 mL IVPB  Status:  Discontinued        3 g 200 mL/hr over 30 Minutes Intravenous Every 6 hours 07/14/21 1331 07/14/21 1456   07/14/21 1500  piperacillin-tazobactam (ZOSYN) IVPB 3.375 g        3.375 g 12.5 mL/hr over 240 Minutes Intravenous Every 8 hours 07/14/21 1456     07/14/21 1200  cefTRIAXone (ROCEPHIN) 2 g in sodium chloride 0.9 % 100 mL IVPB  Status:  Discontinued        2 g 200 mL/hr over 30 Minutes Intravenous Every 24 hours 07/14/21 0724 07/14/21 1323   07/14/21 1000  metroNIDAZOLE (FLAGYL) IVPB 500 mg  Status:  Discontinued        500 mg 100 mL/hr over 60 Minutes Intravenous Every 12 hours 07/14/21 0724 07/14/21 1323   07/13/21 2145  piperacillin-tazobactam (ZOSYN) IVPB 3.375 g        3.375 g 100 mL/hr over 30 Minutes Intravenous  Once 07/13/21 2130 07/13/21 2300   07/13/21 2145  cefTRIAXone (ROCEPHIN) 1 g in sodium chloride 0.9 % 100 mL IVPB        1 g 200 mL/hr over 30 Minutes Intravenous  Once 07/13/21 2130 07/13/21 2227              Family  Communication/Anticipated D/C date and plan/Code Status   DVT prophylaxis:      Code Status: Full Code  Family Communication: Plan discussed with his wife at the bedside Disposition Plan: Plan to discharge home in 1 to 2 days   Status is: Inpatient Remains inpatient appropriate because: IV antibiotics       Subjective:   Interval events noted.  He had fever overnight with Tmax of 102.9 F.  No abdominal pain  Objective:    Vitals:   07/16/21 0130 07/16/21 0237 07/16/21 0333 07/16/21 0749  BP: (!) 154/68 (!) 135/55 132/62 (!) 128/52  Pulse: 100 83 80 65  Resp: '20 18 20 18  '$ Temp: (!) 102.9 F (39.4 C) (!) 101.6 F (38.7 C) 99.9 F (37.7 C) 98.1 F (36.7 C)  TempSrc: Oral  Oral Oral  SpO2: 90% 94% 92% 94%  Weight:      Height:       No data found.   Intake/Output Summary (Last 24 hours) at  07/16/2021 1530 Last data filed at 07/16/2021 1438 Gross per 24 hour  Intake 480 ml  Output --  Net 480 ml   Filed Weights   07/13/21 1956 07/13/21 2354  Weight: 104.8 kg 104.9 kg    Exam:  GEN: NAD SKIN: No rash EYES: EOMI ENT: MMM CV: RRR PULM: CTA B ABD: soft, ND, NT, +BS CNS: AAO x 3, non focal EXT: No edema or tenderness        Data Reviewed:   I have personally reviewed following labs and imaging studies:  Labs: Labs show the following:   Basic Metabolic Panel: Recent Labs  Lab 07/10/21 0519 07/11/21 0501 07/12/21 0318 07/13/21 2007 07/14/21 0717  NA 138 138 137 135 138  K 4.3 4.1 4.2 3.8 3.5  CL 105 107 105 104 106  CO2 '27 27 27 23 27  '$ GLUCOSE 112* 101* 101* 208* 119*  BUN '16 18 15 17 14  '$ CREATININE 0.72 0.65 0.83 0.74 0.59*  CALCIUM 8.9 8.7* 8.9 8.9 8.8*  MG 1.8 2.0 2.1  --   --   PHOS 3.5 3.8 3.1  --   --    GFR Estimated Creatinine Clearance: 83.5 mL/min (A) (by C-G formula based on SCr of 0.59 mg/dL (L)). Liver Function Tests: Recent Labs  Lab 07/11/21 0501 07/11/21 1551 07/12/21 0318 07/13/21 2007 07/14/21 0717  AST 437*  476* 298* 173* 65* 47*  ALT 321*  322* 296* 217* 127* 100*  ALKPHOS 251*  265* 291* 240* 218* 173*  BILITOT 3.3*  3.6* 1.7* 1.4* 1.4* 0.8  PROT 5.9*  6.0* 6.9 6.3* 7.0 6.1*  ALBUMIN 2.9*  2.9* 3.2* 3.0* 3.3* 2.9*   Recent Labs  Lab 07/11/21 0501 07/12/21 0318  LIPASE 16 16   No results for input(s): AMMONIA in the last 168 hours. Coagulation profile No results for input(s): INR, PROTIME in the last 168 hours.  CBC: Recent Labs  Lab 07/10/21 0519 07/11/21 0501 07/13/21 2007 07/14/21 0717 07/15/21 0503  WBC 10.1 5.5 11.2* 8.8 11.2*  NEUTROABS  --   --  8.4*  --   --   HGB 12.7* 11.8* 12.7* 11.4* 11.6*  HCT 37.4* 34.3* 35.3* 33.1* 33.7*  MCV 93.7 93.5 94.6 93.8 95.5  PLT 181 179 246 217 229   Cardiac Enzymes: No results for input(s): CKTOTAL, CKMB, CKMBINDEX, TROPONINI in the last 168  hours. BNP (last 3 results) No results for input(s): PROBNP in the last 8760 hours. CBG: No results  for input(s): GLUCAP in the last 168 hours. D-Dimer: No results for input(s): DDIMER in the last 72 hours. Hgb A1c: No results for input(s): HGBA1C in the last 72 hours. Lipid Profile: No results for input(s): CHOL, HDL, LDLCALC, TRIG, CHOLHDL, LDLDIRECT in the last 72 hours. Thyroid function studies: No results for input(s): TSH, T4TOTAL, T3FREE, THYROIDAB in the last 72 hours.  Invalid input(s): FREET3 Anemia work up: No results for input(s): VITAMINB12, FOLATE, FERRITIN, TIBC, IRON, RETICCTPCT in the last 72 hours. Sepsis Labs: Recent Labs  Lab 07/11/21 0501 07/13/21 2007 07/14/21 0717 07/15/21 0503  WBC 5.5 11.2* 8.8 11.2*  LATICACIDVEN  --  1.7  --   --     Microbiology Recent Results (from the past 240 hour(s))  Culture, blood (Routine X 2) w Reflex to ID Panel     Status: None   Collection Time: 07/11/21  3:51 PM   Specimen: BLOOD  Result Value Ref Range Status   Specimen Description BLOOD BLOOD LEFT HAND  Final   Special Requests   Final    BOTTLES DRAWN AEROBIC AND ANAEROBIC Blood Culture adequate volume   Culture   Final    NO GROWTH 5 DAYS Performed at [facility], [address], [place], [place] [address]    Report Status 07/16/2021 FINAL  Final  Culture, blood (Routine X 2) w Reflex to ID Panel     Status: None   Collection Time: 07/11/21  4:04 PM   Specimen: BLOOD  Result Value Ref Range Status   Specimen Description BLOOD BLOOD RIGHT HAND  Final   Special Requests   Final    BOTTLES DRAWN AEROBIC AND ANAEROBIC Blood Culture adequate volume   Culture   Final    NO GROWTH 5 DAYS Performed at [facility], [address], [place], [place] [address]    Report Status 07/16/2021 FINAL  Final  Blood culture (routine x 2)     Status: None (Preliminary result)   Collection Time: 07/13/21  8:07 PM   Specimen: BLOOD  Result Value Ref  Range Status   Specimen Description BLOOD RIGHT HAND  Final   Special Requests   Final    BOTTLES DRAWN AEROBIC AND ANAEROBIC Blood Culture adequate volume   Culture   Final    NO GROWTH 3 DAYS Performed at [facility], [address], [place], [place] [address]    Report Status PENDING  Incomplete  Blood culture (routine x 2)     Status: None (Preliminary result)   Collection Time: 07/13/21  9:33 PM   Specimen: BLOOD  Result Value Ref Range Status   Specimen Description BLOOD RIGHT FA  Final   Special Requests   Final    BOTTLES DRAWN AEROBIC AND ANAEROBIC Blood Culture adequate volume   Culture   Final    NO GROWTH 3 DAYS Performed at [facility], [address], [place], [place] [address]    Report Status PENDING  Incomplete    Procedures and diagnostic studies:  CT ABDOMEN PELVIS W CONTRAST  Result Date: 07/14/2021 CLINICAL DATA:  MD NOTE; son at bedside patient was discharged June 3 at home with diagnosis of acute cholangitis and to finish up oral antibiotic as outpatient. He started having fever high-grade fever over the weekend. EXAM: CT ABDOMEN AND PELVIS WITH CONTRAST TECHNIQUE: Multidetector CT imaging of the abdomen and pelvis was performed using the standard protocol following bolus administration of intravenous contrast. RADIATION DOSE REDUCTION: This exam was  performed according to the departmental dose-optimization program which includes automated exposure control, adjustment of the mA and/or kV according to patient size and/or use of iterative reconstruction technique. CONTRAST:  129m OMNIPAQUE IOHEXOL 300 MG/ML  SOLN COMPARISON:  MRI on 07/08/2021, ultrasound of the abdomen on 07/13/2021, CT of the abdomen and pelvis on 11/15/2020 FINDINGS: Lower chest: Lung bases are unremarkable. Partially imaged aortic valve replacement. Hepatobiliary: Prior cholecystectomy. Liver contour is normal. There is mildly heterogeneous appearance of the dome of the LEFT  hepatic lobe, with numerous subtle low-attenuation areas measuring 5 millimeters or smaller. Pancreas: Unremarkable. No pancreatic ductal dilatation or surrounding inflammatory changes. Spleen: Normal in size without focal abnormality. Adrenals/Urinary Tract: Adrenal glands are normal. Kidneys are symmetric in size and enhancement. Tiny low-attenuation lesion in the RIGHT UPPER pole region is consistent with small cyst based on Hounsfield unit criteria. Punctate intrarenal calculus identified in the UPPER pole of the LEFT kidney. The ureters are unremarkable without evidence for urinary tract obstruction. The bladder and visualized portion of the urethra are normal. Stomach/Bowel: Stomach and small bowel loops are normal in appearance. There are numerous colonic diverticula. No acute diverticulitis. The appendix is well seen and normal in appearance. Vascular/Lymphatic: There is atherosclerotic calcification of the abdominal aorta. No associated aneurysm. Portacaval lymph node is 1.1 centimeters on image 29 of series 2. RIGHT external iliac node is 0.8 centimeters on image 77 of series 2. Reproductive: Prostate is unremarkable. Other: Fat containing bilateral inguinal hernias noted. There is focal subcutaneous gas in the LEFT anterior abdominal wall, consistent with injection site. Musculoskeletal: There are degenerative changes primarily in the LOWER lumbar spine and at the lumbosacral junction. No lytic or blastic lesions. Previous median sternotomy. IMPRESSION: 1. Mildly heterogeneous appearance of the liver, with tiny, less than 5 millimeter lesions. The appearance is similar to findings on recent MRI and consistent with inflammatory changes. Small abscesses cannot be excluded. 2. Cholecystectomy. 3. Portacaval lymph node is 1.1 centimeters, likely reactive. 4. Possible nonobstructing intrarenal calculus in the UPPER pole of the LEFT kidney. 5. Colonic diverticulosis. 6.  Aortic atherosclerosis.  (ICD10-I70.0)  7. Bilateral fat containing inguinal hernias. Electronically Signed   By: ENolon NationsM.D.   On: 07/14/2021 17:14               LOS: 3 days   Kerrigan Glendening  [facility]   Pager on [email or web address] If 7PM-7AM, please contact night-coverage at [email or web address]     07/16/2021, 3:30 PM

## 2021-07-16 NOTE — Care Management Important Message (Signed)
Important Message  Patient Details  Name: Jacob Montes MRN: [id number] Date of Birth: 13-Dec-1940   Medicare Important Message Given:  N/A - LOS <3 / Initial given by admissions     Dannette Barbara 07/16/2021, 8:24 AM

## 2021-07-16 NOTE — [place] Notes (Signed)
ID Pt had chills and fever early this morning Now feeling better No pain abdomen   O/e awake and alert, looks well Patient Vitals for the past 24 hrs:  BP Temp Temp src Pulse Resp SpO2  07/16/21 0749 (!) 128/52 98.1 F (36.7 C) Oral 65 18 94 %  07/16/21 0333 132/62 99.9 F (37.7 C) Oral 80 20 92 %  07/16/21 0237 (!) 135/55 (!) 101.6 F (38.7 C) -- 83 18 94 %  07/16/21 0130 (!) 154/68 (!) 102.9 F (39.4 C) Oral 100 20 90 %  07/16/21 0053 -- (!) 101.1 F (38.4 C) Oral -- -- --  07/16/21 0034 -- 99.9 F (37.7 C) -- -- -- --  07/15/21 2249 -- 99.6 F (37.6 C) Oral -- -- --  07/15/21 2128 -- (!) 100.8 F (38.2 C) Oral -- -- --  07/15/21 2019 (!) 140/56 (!) 101 F (38.3 C) Oral 83 18 93 %    Chest CTA Hss1s2 Abd soft Cns non focal  Labs    Latest Ref Rng & Units 07/16/2021    3:40 PM 07/15/2021    5:03 AM 07/14/2021    7:17 AM  CBC  WBC 4.0 - 10.5 K/uL 7.3   11.2   8.8    Hemoglobin 13.0 - 17.0 g/dL 11.2   11.6   11.4    Hematocrit 39.0 - 52.0 % 33.1   33.7   33.1    Platelets 150 - 400 K/uL 242   229   217         Latest Ref Rng & Units 07/16/2021    3:40 PM 07/14/2021    7:17 AM 07/13/2021    8:07 PM  CMP  Glucose 70 - 99 mg/dL 107   119   208    BUN 8 - 23 mg/dL '14   14   17    '$ Creatinine 0.61 - 1.24 mg/dL 0.69   0.59   0.74    Sodium 135 - 145 mmol/L 136   138   135    Potassium 3.5 - 5.1 mmol/L 3.9   3.5   3.8    Chloride 98 - 111 mmol/L 103   106   104    CO2 22 - 32 mmol/L '28   27   23    '$ Calcium 8.9 - 10.3 mg/dL 8.6   8.8   8.9    Total Protein 6.5 - 8.1 g/dL 6.2   6.1   7.0    Total Bilirubin 0.3 - 1.2 mg/dL 0.6   0.8   1.4    Alkaline Phos 38 - 126 U/L 142   173   218    AST 15 - 41 U/L 52   47   65    ALT 0 - 44 U/L 72   100   127      Micro BC NG  Impression/recommendation Ascending cholangitis secondary to CBD obstruction Underwent ERCP on 07/10/2021 with biliary sphincterotomy and removal of sludge and stones Intermittent fever for the past 2  weeks Continues to have fever ??? Liver abscesses small Discussed with radiology and there is nothing to aspirate  is non specific and likely some edema Currently on Zosyn, if he continues to have fever will change to meropenem Also will send blood culture if temp > 101 Cbc and cmp repeated and it is improving Will need 4 weeks of antibiotics Pt needs to be afebrile for 24-48 hrs  off tylenol before PICC can  be placed  Cholecystectomy in January 2022.  Has had 3 episodes of cholangitis since then We need to rule out other causes  Coronary artery disease status post CABG  Status post TAVR Will get 2 echo to make sure no veg  Discussed the management with the patient and his wife at bedside.

## 2021-07-16 NOTE — TOC Initial Note (Addendum)
Transition of Care [facility]) - Initial/Assessment Note    Patient Details  Name: Jacob Montes MRN: [id number] Date of Birth: 1940-12-18  Transition of Care [facility]) CM/SW Contact:    Candie Chroman, LCSW Phone Number: 07/16/2021, 10:56 AM  Clinical Narrative:  CSW met with patient. Wife at bedside. CSW introduced role and explained that discharge planning would be discussed. Plan for IV abx at discharge. Discussed with [employer] representative, Carolynn Sayers, yesterday. Provided CMS scores for home health agencies that serve his zip code. Wife will review and call CSW with preference. Wife said education will need to be provided to both her and their son who lives in the area. No further concerns. CSW encouraged patient and his wife to contact CSW as needed. CSW will continue to follow patient and his wife for support and facilitate return home when stable.              3:30 pm: Received voicemail from wife that they prefer [place] [facility]). Representative is reviewing referral.  Expected Discharge Plan: [place] Barriers to Discharge: Continued Medical Work up   Patient Goals and CMS Choice   CMS Medicare.gov Compare Post Acute Care list provided to:: Patient (Wife at bedside)    Expected Discharge Plan and Services Expected Discharge Plan: [place] Acute Care Choice: [place] arrangements for the past 2 months: Single Family Home                           HH Arranged: IV Antibiotics HH Agency: [employer] Date HH Agency Contacted: 07/15/21   Representative spoke with at [place]: Carolynn Sayers  Prior Living Arrangements/Services Living arrangements for the past 2 months: [place] Lives with:: Spouse Patient language and need for interpreter reviewed:: Yes Do you feel safe going back to the place where you live?: Yes      Need for Family Participation in Patient Care: Yes (Comment) Care giver support  system in place?: Yes (comment)   Criminal Activity/Legal Involvement Pertinent to Current Situation/Hospitalization: No - Comment as needed  Activities of Daily Living Home Assistive Devices/Equipment: None ADL Screening (condition at time of admission) Patient's cognitive ability adequate to safely complete daily activities?: Yes Is the patient deaf or have difficulty hearing?: No Does the patient have difficulty seeing, even when wearing glasses/contacts?: No Does the patient have difficulty concentrating, remembering, or making decisions?: No Patient able to express need for assistance with ADLs?: Yes Does the patient have difficulty dressing or bathing?: No Independently performs ADLs?: Yes (appropriate for developmental age) Does the patient have difficulty walking or climbing stairs?: No Weakness of Legs: None Weakness of Arms/Hands: None  Permission Sought/Granted Permission sought to share information with : Family Supports    Share Information with NAME: Marico Buckle     Permission granted to share info w Relationship: Wife  Permission granted to share info w Contact Information: [phone]  Emotional Assessment Appearance:: Appears stated age Attitude/Demeanor/Rapport: Engaged, Gracious Affect (typically observed): Accepting, Appropriate, Calm, Pleasant Orientation: : Oriented to Self, Oriented to Place, Oriented to  Time, Oriented to Situation Alcohol / Substance Use: Not Applicable Psych Involvement: No (comment)  Admission diagnosis:  Ascending cholangitis [K83.09] Fever, unspecified fever cause [R50.9] Patient Active Problem List   Diagnosis Date Noted   Liver abscess 07/15/2021   Choledocholithiasis 07/10/2021   Elevated liver enzymes 07/09/2021   Acute cholangitis  with choledocholithiasis 07/08/2021   Atypical chest pain 04/22/2021   History of CAD 04/22/2021   Paroxysmal tachycardia ([place]) 04/22/2021   History of transcatheter aortic valve replacement  (TAVR) 04/22/2021   Morbid obesity ([place]) 04/22/2021   CKD (chronic kidney disease) 12/03/2020   Bacteremia    Common bile duct stone    Ascending cholangitis 11/17/2018   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 03/15/5571   Dysmetabolic syndrome 37/80/1081   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   Hyperglycemia 11/16/2018   Tear of meniscus of left knee 05/24/2018   Constipation 10/07/2016   Aortic stenosis 10/15/2015   Benign essential hypertension 10/15/2015   Carotid stenosis, bilateral 10/15/2015   Hypogonadism, 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   Nephrolithiasis 10/15/2015   Sleep apnea 10/15/2015   Hyperlipidemia 10/15/2015   Polyosteoarthritis, 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

## 2021-07-17 ENCOUNTER — Inpatient Hospital Stay (HOSPITAL_COMMUNITY)
Admit: 2021-07-17 | Discharge: 2021-07-17 | Disposition: A | Discharge status: Home / Self Care | Payer: Medicare Other | Attending: Infectious Diseases | Admitting: Infectious Diseases

## 2021-07-17 DIAGNOSIS — K8309 Other cholangitis: Secondary | ICD-10-CM | POA: Diagnosis not present

## 2021-07-17 DIAGNOSIS — K750 Abscess of liver: Secondary | ICD-10-CM | POA: Diagnosis not present

## 2021-07-17 DIAGNOSIS — R509 Fever, unspecified: Secondary | ICD-10-CM | POA: Diagnosis not present

## 2021-07-17 LAB — ECHOCARDIOGRAM COMPLETE
AR max vel: 1.03 cm2
AV Area VTI: 0.99 cm2
AV Area mean vel: 1.04 cm2
AV Mean grad: 23 mmHg
AV Peak grad: 44.1 mmHg
Ao pk vel: 3.32 m/s
Area-P 1/2: 3.1 cm2
Height: 66 in
MV VTI: 1.65 cm2
S' Lateral: 4.38 cm
Weight: 3700.2 oz

## 2021-07-17 MED ORDER — SODIUM CHLORIDE 0.9 % IV SOLN
1.0000 g | Freq: Three times a day (TID) | INTRAVENOUS | Status: DC
  Administered 2021-07-17 – 2021-07-19 (×6): 1 g via INTRAVENOUS
  Filled 2021-07-17: qty 1
  Filled 2021-07-17: qty 20
  Filled 2021-07-17: qty 1
  Filled 2021-07-17: qty 20
  Filled 2021-07-17 (×2): qty 1
  Filled 2021-07-17: qty 20

## 2021-07-17 NOTE — TOC Progression Note (Signed)
Transition of Care [facility]) - Progression Note    Patient Details  Name: Jacob Montes MRN: [id number] Date of Birth: 26-Feb-1940  Transition of Care [facility]) CM/SW [place], LCSW Phone Number: 07/17/2021, 8:50 AM  Clinical Narrative:  [facility] is able to accept referral for [facility]. [employer] representative is aware.   Expected Discharge Plan: [place] Barriers to Discharge: Continued Medical Work up  Expected Discharge Plan and Services Expected Discharge Plan: [place] Choice: [place] arrangements for the past 2 months: Single Family Home                           HH Arranged: IV Antibiotics HH Agency: [employer] Date HH Agency Contacted: 07/15/21   Representative spoke with at [place]: [place] (SDOH) Interventions    Readmission Risk Interventions     No data to display

## 2021-07-17 NOTE — Progress Notes (Signed)
ID Son at bed side  Pt had cd fever early this morning No pain abdomen Eating well No nausea or vomiting    O/e awake and alert, looks well Patient Vitals for the past 24 hrs:  BP Temp Temp src Pulse Resp SpO2  07/17/21 1615 (!) 149/57 98.4 F (36.9 C) Oral 67 18 95 %  07/17/21 0824 (!) 146/54 98.7 F (37.1 C) Oral 76 18 93 %  07/17/21 0700 -- (!) 101 F (38.3 C) Oral -- -- --  07/17/21 0544 -- (!) 100.5 F (38.1 C) Oral -- -- --  07/17/21 0400 -- (!) 100.7 F (38.2 C) Oral -- -- --  07/17/21 0342 (!) 161/57 -- -- 83 -- --  07/17/21 0337 (!) 170/58 (!) 100.5 F (38.1 C) Oral 86 20 93 %  07/17/21 0058 -- 99.4 F (37.4 C) Oral -- -- --  07/16/21 2200 -- 99 F (37.2 C) Oral -- -- --  07/16/21 2002 (!) 140/54 100 F (37.8 C) Oral 80 16 91 %    Chest CTA [id number] Abd soft Cns non focal  Labs    Latest Ref Rng & Units 07/16/2021    3:40 PM 07/15/2021    5:03 AM 07/14/2021    7:17 AM  CBC  WBC 4.0 - 10.5 K/uL 7.3  11.2  8.8   Hemoglobin 13.0 - 17.0 g/dL 11.2  11.6  11.4   Hematocrit 39.0 - 52.0 % 33.1  33.7  33.1   Platelets 150 - 400 K/uL 242  229  217        Latest Ref Rng & Units 07/16/2021    3:40 PM 07/14/2021    7:17 AM 07/13/2021    8:07 PM  CMP  Glucose 70 - 99 mg/dL 107  119  208   BUN 8 - 23 mg/dL '14  14  17   '$ Creatinine 0.61 - 1.24 mg/dL 0.69  0.59  0.74   Sodium 135 - 145 mmol/L 136  138  135   Potassium 3.5 - 5.1 mmol/L 3.9  3.5  3.8   Chloride 98 - 111 mmol/L 103  106  104   CO2 22 - 32 mmol/L '28  27  23   '$ Calcium 8.9 - 10.3 mg/dL 8.6  8.8  8.9   Total Protein 6.5 - 8.1 g/dL 6.2  6.1  7.0   Total Bilirubin 0.3 - 1.2 mg/dL 0.6  0.8  1.4   Alkaline Phos 38 - 126 U/L 142  173  218   AST 15 - 41 U/L 52  47  65   ALT 0 - 44 U/L 72  100  127     Micro BC NG  Impression/recommendation Ascending cholangitis secondary to CBD obstruction Underwent ERCP on 07/10/2021 with biliary sphincterotomy and removal of sludge and stones Intermittent fever for the past 3  weeks Continues to have fever ??? Liver abscesses small Discussed with radiology and there is nothing to aspirate  as non specific and likely some edema Zosyn changed to meropenem Blood culture sent this morning No leucocytosis LFTS much imrproved- near normal If fever recurs then may consider PET scan VS repeat CY=T abdomen ( but will wait for another 48 hrs), doppler legs to r.o DVT  Will need 4 weeks of antibiotics Pt needs to be afebrile for 48 hrs  off tylenol before PICC can be placed  Cholecystectomy in January 2022.  Has had 3 episodes of cholangitis since then We need to rule out other causes  Coronary artery disease status post CABG  Status post TAVR 2 echo no obvious vegetation Discussed the management with the patient and his son at bedisde

## 2021-07-17 NOTE — Progress Notes (Signed)
Progress Note    Jacob Montes  ZCH:[id number] DOB: 1941/01/22  DOA: 07/13/2021 PCP: Jerrol Banana., MD      Brief Narrative:    Medical records reviewed and are as summarized below:  Jacob Montes is a 81 y.o. male with medical history significant for CAD, aortic stenosis s/p TAVR, hypertension, hyperlipidemia, BPH, CAD, obesity, recent discharge from the hospital on 07/12/2021 after hospitalization on 07/08/2021 for acute cholangitis with choledocholithiasis s/p ERCP.  He presented to the hospital with fever despite being on oral antibiotics since discharge from the hospital. CT abdomen and pelvis was concerning for liver abscesses.     Assessment/Plan:   Principal Problem:   Liver abscess Active Problems:   Elevated liver enzymes   History of CAD   Morbid obesity (HCC)   Benign essential hypertension   Ascending cholangitis   Hyperlipidemia   CKD (chronic kidney disease)    Body mass index is 37.33 kg/m.  (Obesity)  ? Liver abscesses, recurrent fever: Blood culture was obtained today.  Continue IV Zosyn.  2D echo has been ordered to exclude vegetations.  He will have to be afebrile for at least 24 hours before PICC line placement.  Follow-up with ID for further recommendations.  Recent acute cholangitis, choledocholithiasis: S/p ERCP on 07/10/2021 with stone extraction.  Liver enzymes are better.  Appreciate input from GI.  GI has signed off.  Elevated liver enzymes: Improved.  Other comorbidities include hypertension, GERD   Diet Order             Diet Heart Room service appropriate? Yes; Fluid consistency: Thin  Diet effective now                            Consultants: Gastroenterologist, infectious disease  Procedures: None    Medications:    amLODipine  10 mg Oral Daily   enoxaparin (LOVENOX) injection  0.5 mg/kg Subcutaneous Q24H   metoprolol tartrate  12.5 mg Oral BID   multivitamin with minerals  1 tablet Oral Daily    pantoprazole  40 mg Oral Daily   Continuous Infusions:  meropenem (MERREM) IV 1 g (07/17/21 1400)     Anti-infectives (From admission, onward)    Start     Dose/Rate Route Frequency Ordered Stop   07/17/21 1400  meropenem (MERREM) 1 g in sodium chloride 0.9 % 100 mL IVPB        1 g 200 mL/hr over 30 Minutes Intravenous Every 8 hours 07/17/21 1055     07/14/21 2200  Ampicillin-Sulbactam (UNASYN) 3 g in sodium chloride 0.9 % 100 mL IVPB  Status:  Discontinued        3 g 200 mL/hr over 30 Minutes Intravenous Every 6 hours 07/14/21 1331 07/14/21 1456   07/14/21 1500  piperacillin-tazobactam (ZOSYN) IVPB 3.375 g  Status:  Discontinued        3.375 g 12.5 mL/hr over 240 Minutes Intravenous Every 8 hours 07/14/21 1456 07/17/21 1055   07/14/21 1200  cefTRIAXone (ROCEPHIN) 2 g in sodium chloride 0.9 % 100 mL IVPB  Status:  Discontinued        2 g 200 mL/hr over 30 Minutes Intravenous Every 24 hours 07/14/21 0724 07/14/21 1323   07/14/21 1000  metroNIDAZOLE (FLAGYL) IVPB 500 mg  Status:  Discontinued        500 mg 100 mL/hr over 60 Minutes Intravenous Every 12 hours 07/14/21 0724 07/14/21 1323   07/13/21  2145  piperacillin-tazobactam (ZOSYN) IVPB 3.375 g        3.375 g 100 mL/hr over 30 Minutes Intravenous  Once 07/13/21 2130 07/13/21 2300   07/13/21 2145  cefTRIAXone (ROCEPHIN) 1 g in sodium chloride 0.9 % 100 mL IVPB        1 g 200 mL/hr over 30 Minutes Intravenous  Once 07/13/21 2130 07/13/21 2227              Family Communication/Anticipated D/C date and plan/Code Status   DVT prophylaxis:      Code Status: Full Code  Family Communication: Plan discussed with his wife at the bedside Disposition Plan: Plan to discharge home in 1 to 2 days   Status is: Inpatient Remains inpatient appropriate because: IV antibiotics       Subjective:   Interval events noted.  He still having fevers.  Tmax was 101 F this morning.  No abdominal pain, no vomiting.  His wife is at  the bedside.  Objective:    Vitals:   07/17/21 0400 07/17/21 0544 07/17/21 0700 07/17/21 0824  BP:    (!) 146/54  Pulse:    76  Resp:    18  Temp: (!) 100.7 F (38.2 C) (!) 100.5 F (38.1 C) (!) 101 F (38.3 C) 98.7 F (37.1 C)  TempSrc: Oral Oral Oral Oral  SpO2:    93%  Weight:      Height:       No data found.   Intake/Output Summary (Last 24 hours) at 07/17/2021 1429 Last data filed at 07/16/2021 1438 Gross per 24 hour  Intake 240 ml  Output --  Net 240 ml   Filed Weights   07/13/21 1956 07/13/21 2354  Weight: 104.8 kg 104.9 kg    Exam:  GEN: NAD SKIN: No rash EYES: EOMI ENT: MMM CV: RRR PULM: CTA B ABD: soft, ND, NT, +BS CNS: AAO x 3, non focal EXT: No edema or tenderness    Data Reviewed:   I have personally reviewed following labs and imaging studies:  Labs: Labs show the following:   Basic Metabolic Panel: Recent Labs  Lab 07/11/21 0501 07/12/21 0318 07/13/21 2007 07/14/21 0717 07/16/21 1540  NA 138 137 135 138 136  K 4.1 4.2 3.8 3.5 3.9  CL 107 105 104 106 103  CO2 '27 27 23 27 28  '$ GLUCOSE 101* 101* 208* 119* 107*  BUN '18 15 17 14 14  '$ CREATININE 0.65 0.83 0.74 0.59* 0.69  CALCIUM 8.7* 8.9 8.9 8.8* 8.6*  MG 2.0 2.1  --   --   --   PHOS 3.8 3.1  --   --   --    GFR Estimated Creatinine Clearance: 83.5 mL/min (by C-G formula based on SCr of 0.69 mg/dL). Liver Function Tests: Recent Labs  Lab 07/11/21 1551 07/12/21 0318 07/13/21 2007 07/14/21 0717 07/16/21 1540  AST 298* 173* 65* 47* 52*  ALT 296* 217* 127* 100* 72*  ALKPHOS 291* 240* 218* 173* 142*  BILITOT 1.7* 1.4* 1.4* 0.8 0.6  PROT 6.9 6.3* 7.0 6.1* 6.2*  ALBUMIN 3.2* 3.0* 3.3* 2.9* 3.0*   Recent Labs  Lab 07/11/21 0501 07/12/21 0318  LIPASE 16 16   No results for input(s): "AMMONIA" in the last 168 hours. Coagulation profile No results for input(s): "INR", "PROTIME" in the last 168 hours.  CBC: Recent Labs  Lab 07/11/21 0501 07/13/21 2007 07/14/21 0717  07/15/21 0503 07/16/21 1540  WBC 5.5 11.2* 8.8 11.2* 7.3  NEUTROABS  --  8.4*  --   --  4.0  HGB 11.8* 12.7* 11.4* 11.6* 11.2*  HCT 34.3* 35.3* 33.1* 33.7* 33.1*  MCV 93.5 94.6 93.8 95.5 95.9  PLT 179 246 217 229 242   Cardiac Enzymes: No results for input(s): "CKTOTAL", "CKMB", "CKMBINDEX", "TROPONINI" in the last 168 hours. BNP (last 3 results) No results for input(s): "PROBNP" in the last 8760 hours. CBG: No results for input(s): "GLUCAP" in the last 168 hours. D-Dimer: No results for input(s): "DDIMER" in the last 72 hours. Hgb A1c: No results for input(s): "HGBA1C" in the last 72 hours. Lipid Profile: No results for input(s): "CHOL", "HDL", "LDLCALC", "TRIG", "CHOLHDL", "LDLDIRECT" in the last 72 hours. Thyroid function studies: No results for input(s): "TSH", "T4TOTAL", "T3FREE", "THYROIDAB" in the last 72 hours.  Invalid input(s): "FREET3" Anemia work up: No results for input(s): "VITAMINB12", "FOLATE", "FERRITIN", "TIBC", "IRON", "RETICCTPCT" in the last 72 hours. Sepsis Labs: Recent Labs  Lab 07/13/21 2007 07/14/21 0717 07/15/21 0503 07/16/21 1540  PROCALCITON  --   --   --  0.14  WBC 11.2* 8.8 11.2* 7.3  LATICACIDVEN 1.7  --   --   --     Microbiology Recent Results (from the past 240 hour(s))  Culture, blood (Routine X 2) w Reflex to ID Panel     Status: None   Collection Time: 07/11/21  3:51 PM   Specimen: BLOOD  Result Value Ref Range Status   Specimen Description BLOOD BLOOD LEFT HAND  Final   Special Requests   Final    BOTTLES DRAWN AEROBIC AND ANAEROBIC Blood Culture adequate volume   Culture   Final    NO GROWTH 5 DAYS Performed at [facility], [address], [place], [place] [address]    Report Status 07/16/2021 FINAL  Final  Culture, blood (Routine X 2) w Reflex to ID Panel     Status: None   Collection Time: 07/11/21  4:04 PM   Specimen: BLOOD  Result Value Ref Range Status   Specimen Description BLOOD BLOOD RIGHT HAND  Final    Special Requests   Final    BOTTLES DRAWN AEROBIC AND ANAEROBIC Blood Culture adequate volume   Culture   Final    NO GROWTH 5 DAYS Performed at [facility], [address], [place], [place] [address]    Report Status 07/16/2021 FINAL  Final  Blood culture (routine x 2)     Status: None (Preliminary result)   Collection Time: 07/13/21  8:07 PM   Specimen: BLOOD  Result Value Ref Range Status   Specimen Description BLOOD RIGHT HAND  Final   Special Requests   Final    BOTTLES DRAWN AEROBIC AND ANAEROBIC Blood Culture adequate volume   Culture   Final    NO GROWTH 4 DAYS Performed at [facility], [address] [place], [place] [address]    Report Status PENDING  Incomplete  Blood culture (routine x 2)     Status: None (Preliminary result)   Collection Time: 07/13/21  9:33 PM   Specimen: BLOOD  Result Value Ref Range Status   Specimen Description BLOOD RIGHT FA  Final   Special Requests   Final    BOTTLES DRAWN AEROBIC AND ANAEROBIC Blood Culture adequate volume   Culture   Final    NO GROWTH 4 DAYS Performed at [facility], [address] [place], [place] [address]    Report Status PENDING  Incomplete  Procedures and diagnostic studies:  ECHOCARDIOGRAM COMPLETE  Result Date: 07/17/2021    ECHOCARDIOGRAM REPORT   Patient Name:   AUGUSTINO SAVASTANO Date of Exam: 07/17/2021 Medical Rec #:  [id number]   Height:       66.0 in Accession #:    [id number]  Weight:       231.3 lb Date of Birth:  [birth year]   BSA:          2.127 m Patient Age:    24 years    BP:           161/57 mmHg Patient Gender: M           HR:           69 bpm. Exam Location:  [facility] Procedure: 2D Echo, Cardiac Doppler and Color Doppler Indications:     R50.9 Fever  History:         Patient has prior history of Echocardiogram examinations, most                  recent 01/16/2021. CAD, Prior CABG, Stroke; Risk                  Factors:Hypertension, Dyslipidemia and Sleep Apnea. S/P TAVR                   03/2017.  Sonographer:     Charmayne Sheer Referring Phys:  [id number] Tsosie Billing Diagnosing Phys: Ida Rogue MD  Sonographer Comments: Technically difficult study due to poor echo windows. Image acquisition challenging due to patient body habitus. IMPRESSIONS  1. No valve vegetation, though challenging images  2. Left ventricular ejection fraction, by estimation, is 60 to 65%. The left ventricle has normal function. The left ventricle has no regional wall motion abnormalities. Left ventricular diastolic parameters are consistent with Grade II diastolic dysfunction (pseudonormalization).  3. Right ventricular systolic function is normal. The right ventricular size is normal. Tricuspid regurgitation signal is inadequate for assessing PA pressure.  4. Left atrial size was moderately dilated.  5. The mitral valve is normal in structure. Mild to moderate mitral valve regurgitation. No evidence of mitral stenosis.  6. The aortic valve was not well visualized. Aortic valve regurgitation is not visualized. Moderate aortic valve stenosis. Aortic valve area, by VTI measures 0.99 cm. Aortic valve mean gradient measures 23.0 mmHg. Aortic valve Vmax measures 3.32 m/s.  7. The inferior vena cava is normal in size with greater than 50% respiratory variability, suggesting right atrial pressure of 3 mmHg. FINDINGS  Left Ventricle: Left ventricular ejection fraction, by estimation, is 60 to 65%. The left ventricle has normal function. The left ventricle has no regional wall motion abnormalities. The left ventricular internal cavity size was normal in size. There is  no left ventricular hypertrophy. Left ventricular diastolic parameters are consistent with Grade II diastolic dysfunction (pseudonormalization). Right Ventricle: The right ventricular size is normal. No increase in right ventricular wall thickness. Right ventricular systolic function is normal. Tricuspid regurgitation signal is inadequate for assessing PA  pressure. Left Atrium: Left atrial size was moderately dilated. Right Atrium: Right atrial size was normal in size. Pericardium: There is no evidence of pericardial effusion. Mitral Valve: The mitral valve is normal in structure. Mild to moderate mitral valve regurgitation. No evidence of mitral valve stenosis. MV peak gradient, 8.9 mmHg. The mean mitral valve gradient is 3.0 mmHg. Tricuspid Valve: The tricuspid valve is normal in structure. Tricuspid valve regurgitation is not demonstrated. No evidence of tricuspid stenosis.  Aortic Valve: The aortic valve was not well visualized. Aortic valve regurgitation is not visualized. Moderate aortic stenosis is present. Aortic valve mean gradient measures 23.0 mmHg. Aortic valve peak gradient measures 44.1 mmHg. Aortic valve area, by  VTI measures 0.99 cm. Pulmonic Valve: The pulmonic valve was normal in structure. Pulmonic valve regurgitation is not visualized. No evidence of pulmonic stenosis. Aorta: The aortic root is normal in size and structure. Venous: The inferior vena cava is normal in size with greater than 50% respiratory variability, suggesting right atrial pressure of 3 mmHg. IAS/Shunts: No atrial level shunt detected by color flow Doppler.  LEFT VENTRICLE PLAX 2D LVIDd:         5.66 cm   Diastology LVIDs:         4.38 cm   LV e' medial:    5.00 cm/s LV PW:         1.04 cm   LV E/e' medial:  25.6 LV IVS:        0.99 cm   LV e' lateral:   7.72 cm/s LVOT diam:     1.90 cm   LV E/e' lateral: 16.6 LV SV:         71 LV SV Index:   33 LVOT Area:     2.84 cm  RIGHT VENTRICLE RV Basal diam:  3.64 cm RV S prime:     13.50 cm/s LEFT ATRIUM             Index LA diam:        5.70 cm 2.68 cm/m LA Vol (A2C):   63.8 ml 29.99 ml/m LA Vol (A4C):   98.8 ml 46.45 ml/m LA Biplane Vol: 84.4 ml 39.68 ml/m  AORTIC VALVE                     PULMONIC VALVE AV Area (Vmax):    1.03 cm      PV Vmax:       1.33 m/s AV Area (Vmean):   1.04 cm      PV Vmean:      85.600 cm/s AV Area  (VTI):     0.99 cm      PV VTI:        0.258 m AV Vmax:           332.00 cm/s   PV Peak grad:  7.1 mmHg AV Vmean:          228.000 cm/s  PV Mean grad:  3.0 mmHg AV VTI:            0.722 m AV Peak Grad:      44.1 mmHg AV Mean Grad:      23.0 mmHg LVOT Vmax:         121.00 cm/s LVOT Vmean:        83.900 cm/s LVOT VTI:          0.251 m LVOT/AV VTI ratio: 0.35  AORTA Ao Root diam: 3.40 cm MITRAL VALVE MV Area (PHT): 3.10 cm     SHUNTS MV Area VTI:   1.65 cm     Systemic VTI:  0.25 m MV Peak grad:  8.9 mmHg     Systemic Diam: 1.90 cm MV Mean grad:  3.0 mmHg MV Vmax:       1.49 m/s MV Vmean:      85.5 cm/s MV Decel Time: 245 msec MV E velocity: 128.00 cm/s MV A velocity: 101.00 cm/s MV E/A ratio:  1.27  Ida Rogue MD Electronically signed by Ida Rogue MD Signature Date/Time: 07/17/2021/12:40:07 PM    Final                LOS: 4 days   Jennye Boroughs  [facility]   Pager on [email or web address]. If 7PM-7AM, please contact night-coverage at [email or web address]     07/17/2021, 2:29 PM

## 2021-07-17 NOTE — Progress Notes (Signed)
*  PRELIMINARY RESULTS* Echocardiogram 2D Echocardiogram has been performed.  Jacob Montes 07/17/2021, 9:00 AM

## 2021-07-18 ENCOUNTER — Inpatient Hospital Stay: Payer: Medicare Other

## 2021-07-18 DIAGNOSIS — R509 Fever, unspecified: Secondary | ICD-10-CM | POA: Diagnosis not present

## 2021-07-18 DIAGNOSIS — K750 Abscess of liver: Secondary | ICD-10-CM | POA: Diagnosis not present

## 2021-07-18 DIAGNOSIS — K8309 Other cholangitis: Secondary | ICD-10-CM | POA: Diagnosis not present

## 2021-07-18 LAB — CBC WITH DIFFERENTIAL/PLATELET
Abs Immature Granulocytes: 0.02 10*3/uL (ref 0.00–0.07)
Basophils Absolute: 0 10*3/uL (ref 0.0–0.1)
Basophils Relative: 0 %
Eosinophils Absolute: 0 10*3/uL (ref 0.0–0.5)
Eosinophils Relative: 0 %
HCT: 34.9 % — ABNORMAL LOW (ref 39.0–52.0)
Hemoglobin: 11.6 g/dL — ABNORMAL LOW (ref 13.0–17.0)
Immature Granulocytes: 0 %
Lymphocytes Relative: 26 %
Lymphs Abs: 2.3 10*3/uL (ref 0.7–4.0)
MCH: 32.1 pg (ref 26.0–34.0)
MCHC: 33.2 g/dL (ref 30.0–36.0)
MCV: 96.7 fL (ref 80.0–100.0)
Monocytes Absolute: 0.7 10*3/uL (ref 0.1–1.0)
Monocytes Relative: 8 %
Neutro Abs: 5.9 10*3/uL (ref 1.7–7.7)
Neutrophils Relative %: 66 %
Platelets: 306 10*3/uL (ref 150–400)
RBC: 3.61 MIL/uL — ABNORMAL LOW (ref 4.22–5.81)
RDW: 13.1 % (ref 11.5–15.5)
WBC: 9 10*3/uL (ref 4.0–10.5)
nRBC: 0 % (ref 0.0–0.2)

## 2021-07-18 LAB — CULTURE, BLOOD (ROUTINE X 2)
Culture: NO GROWTH
Culture: NO GROWTH
Special Requests: ADEQUATE
Special Requests: ADEQUATE

## 2021-07-18 LAB — SEDIMENTATION RATE: Sed Rate: 98 mm/hr — ABNORMAL HIGH (ref 0–20)

## 2021-07-18 LAB — HEPATIC FUNCTION PANEL
ALT: 61 U/L — ABNORMAL HIGH (ref 0–44)
AST: 49 U/L — ABNORMAL HIGH (ref 15–41)
Albumin: 2.9 g/dL — ABNORMAL LOW (ref 3.5–5.0)
Alkaline Phosphatase: 132 U/L — ABNORMAL HIGH (ref 38–126)
Bilirubin, Direct: 0.3 mg/dL — ABNORMAL HIGH (ref 0.0–0.2)
Indirect Bilirubin: 0.7 mg/dL (ref 0.3–0.9)
Total Bilirubin: 1 mg/dL (ref 0.3–1.2)
Total Protein: 6.4 g/dL — ABNORMAL LOW (ref 6.5–8.1)

## 2021-07-18 LAB — HIV ANTIBODY (ROUTINE TESTING W REFLEX): HIV Screen 4th Generation wRfx: NONREACTIVE

## 2021-07-18 LAB — PROCALCITONIN: Procalcitonin: <0.1 ng/mL

## 2021-07-18 LAB — C-REACTIVE PROTEIN: CRP: 8.2 mg/dL — ABNORMAL HIGH (ref <1.0)

## 2021-07-18 LAB — LACTATE DEHYDROGENASE: LDH: 206 U/L — ABNORMAL HIGH (ref 98–192)

## 2021-07-18 LAB — TSH: TSH: 0.592 u[IU]/mL (ref 0.350–4.500)

## 2021-07-18 NOTE — Progress Notes (Signed)
Date of Admission:  07/13/2021    ID: Gilberto Stanforth is a 81 y.o. male Principal Problem:   Liver abscess Active Problems:   Benign essential hypertension   Ascending cholangitis   Hyperlipidemia   CKD (chronic kidney disease)   History of CAD   Morbid obesity (HCC)   Elevated liver enzymes    Subjective: Has fever early morning and after 1 dose of tylenol no more fever No pain abdomen Feels well otherwise   Medications:   amLODipine  10 mg Oral Daily   enoxaparin (LOVENOX) injection  0.5 mg/kg Subcutaneous Q24H   metoprolol tartrate  12.5 mg Oral BID   multivitamin with minerals  1 tablet Oral Daily   pantoprazole  40 mg Oral Daily    Objective: Vital signs in last 24 hours: Temp:  [98.4 F (36.9 C)-102.9 F (39.4 C)] 100.1 F (37.8 C) (06/09 0450) Pulse Rate:  [67-92] 71 (06/09 0450) Resp:  [18-19] 19 (06/09 0450) BP: (128-149)/(51-61) 128/51 (06/09 0450) SpO2:  [92 %-95 %] 94 % (06/09 0450)   PHYSICAL EXAM:  General: Alert, cooperative, no distress, appears stated age.  Head: Normocephalic, without obvious abnormality, atraumatic. Eyes: Conjunctivae clear, anicteric sclerae. Pupils are equal ENT Nares normal. No drainage or sinus tenderness. Lips, mucosa, and tongue normal. No Thrush Neck: Supple, symmetrical, no adenopathy, thyroid: non tender no carotid bruit and no JVD. Back: No CVA tenderness. Lungs: Clear to auscultation bilaterally. No Wheezing or Rhonchi. No rales. Heart: Regular rate and rhythm, no murmur, rub or gallop. Abdomen: Soft, non-tender,not distended. Bowel sounds normal. No masses Extremities: atraumatic, no cyanosis. No edema. No clubbing Skin: No rashes or lesions. Or bruising Lymph: Cervical, supraclavicular normal. Neurologic: Grossly non-focal  Lab Results Recent Labs    07/16/21 1540  WBC 7.3  HGB 11.2*  HCT 33.1*  NA 136  K 3.9  CL 103  CO2 28  BUN 14  CREATININE 0.69   Liver Panel Recent Labs    07/16/21 1540   PROT 6.2*  ALBUMIN 3.0*  AST 52*  ALT 72*  ALKPHOS 142*  BILITOT 0.6   Sedimentation Rate Recent Labs    07/18/21 0515  ESRSEDRATE 98*   C-Reactive Protein No results for input(s): "CRP" in the last 72 hours.  Microbiology:  Studies/Results: ECHOCARDIOGRAM COMPLETE  Result Date: 07/17/2021    ECHOCARDIOGRAM REPORT   Patient Name:   BURNICE OESTREICHER Date of Exam: 07/17/2021 Medical Rec #:  [id number]   Height:       66.0 in Accession #:    [id number]  Weight:       231.3 lb Date of Birth:  05/21/1940   BSA:          2.127 m Patient Age:    48 years    BP:           161/57 mmHg Patient Gender: M           HR:           69 bpm. Exam Location:  [facility] Procedure: 2D Echo, Cardiac Doppler and Color Doppler Indications:     R50.9 Fever  History:         Patient has prior history of Echocardiogram examinations, most                  recent 01/16/2021. CAD, Prior CABG, Stroke; Risk                  Factors:Hypertension, Dyslipidemia and Sleep Apnea. 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  Sonographer:     Charmayne Sheer Referring Phys:  [id number] Tsosie Billing Diagnosing Phys: Ida Rogue MD  Sonographer Comments: Technically difficult study due to poor echo windows. Image acquisition challenging due to patient body habitus. IMPRESSIONS  1. No valve vegetation, though challenging images  2. Left ventricular ejection fraction, by estimation, is 60 to 65%. The left ventricle has normal function. The left ventricle has no regional wall motion abnormalities. Left ventricular diastolic parameters are consistent with Grade II diastolic dysfunction (pseudonormalization).  3. Right ventricular systolic function is normal. The right ventricular size is normal. Tricuspid regurgitation signal is inadequate for assessing PA pressure.  4. Left atrial size was moderately dilated.  5. The mitral valve is normal in structure. Mild to moderate mitral valve regurgitation. No evidence of mitral stenosis.  6. The aortic valve was not  well visualized. Aortic valve regurgitation is not visualized. Moderate aortic valve stenosis. Aortic valve area, by VTI measures 0.99 cm. Aortic valve mean gradient measures 23.0 mmHg. Aortic valve Vmax measures 3.32 m/s.  7. The inferior vena cava is normal in size with greater than 50% respiratory variability, suggesting right atrial pressure of 3 mmHg. FINDINGS  Left Ventricle: Left ventricular ejection fraction, by estimation, is 60 to 65%. The left ventricle has normal function. The left ventricle has no regional wall motion abnormalities. The left ventricular internal cavity size was normal in size. There is  no left ventricular hypertrophy. Left ventricular diastolic parameters are consistent with Grade II diastolic dysfunction (pseudonormalization). Right Ventricle: The right ventricular size is normal. No increase in right ventricular wall thickness. Right ventricular systolic function is normal. Tricuspid regurgitation signal is inadequate for assessing PA pressure. Left Atrium: Left atrial size was moderately dilated. Right Atrium: Right atrial size was normal in size. Pericardium: There is no evidence of pericardial effusion. Mitral Valve: The mitral valve is normal in structure. Mild to moderate mitral valve regurgitation. No evidence of mitral valve stenosis. MV peak gradient, 8.9 mmHg. The mean mitral valve gradient is 3.0 mmHg. Tricuspid Valve: The tricuspid valve is normal in structure. Tricuspid valve regurgitation is not demonstrated. No evidence of tricuspid stenosis. Aortic Valve: The aortic valve was not well visualized. Aortic valve regurgitation is not visualized. Moderate aortic stenosis is present. Aortic valve mean gradient measures 23.0 mmHg. Aortic valve peak gradient measures 44.1 mmHg. Aortic valve area, by  VTI measures 0.99 cm. Pulmonic Valve: The pulmonic valve was normal in structure. Pulmonic valve regurgitation is not visualized. No evidence of pulmonic stenosis. Aorta: The  aortic root is normal in size and structure. Venous: The inferior vena cava is normal in size with greater than 50% respiratory variability, suggesting right atrial pressure of 3 mmHg. IAS/Shunts: No atrial level shunt detected by color flow Doppler.  LEFT VENTRICLE PLAX 2D LVIDd:         5.66 cm   Diastology LVIDs:         4.38 cm   LV e' medial:    5.00 cm/s LV PW:         1.04 cm   LV E/e' medial:  25.6 LV IVS:        0.99 cm   LV e' lateral:   7.72 cm/s LVOT diam:     1.90 cm   LV E/e' lateral: 16.6 LV SV:         71 LV SV Index:  33 LVOT Area:     2.84 cm  RIGHT VENTRICLE RV Basal diam:  3.64 cm RV S prime:     13.50 cm/s LEFT ATRIUM             Index LA diam:        5.70 cm 2.68 cm/m LA Vol (A2C):   63.8 ml 29.99 ml/m LA Vol (A4C):   98.8 ml 46.45 ml/m LA Biplane Vol: 84.4 ml 39.68 ml/m  AORTIC VALVE                     PULMONIC VALVE AV Area (Vmax):    1.03 cm      PV Vmax:       1.33 m/s AV Area (Vmean):   1.04 cm      PV Vmean:      85.600 cm/s AV Area (VTI):     0.99 cm      PV VTI:        0.258 m AV Vmax:           332.00 cm/s   PV Peak grad:  7.1 mmHg AV Vmean:          228.000 cm/s  PV Mean grad:  3.0 mmHg AV VTI:            0.722 m AV Peak Grad:      44.1 mmHg AV Mean Grad:      23.0 mmHg LVOT Vmax:         121.00 cm/s LVOT Vmean:        83.900 cm/s LVOT VTI:          0.251 m LVOT/AV VTI ratio: 0.35  AORTA Ao Root diam: 3.40 cm MITRAL VALVE MV Area (PHT): 3.10 cm     SHUNTS MV Area VTI:   1.65 cm     Systemic VTI:  0.25 m MV Peak grad:  8.9 mmHg     Systemic Diam: 1.90 cm MV Mean grad:  3.0 mmHg MV Vmax:       1.49 m/s MV Vmean:      85.5 cm/s MV Decel Time: 245 msec MV E velocity: 128.00 cm/s MV A velocity: 101.00 cm/s MV E/A ratio:  1.27 Ida Rogue MD Electronically signed by Ida Rogue MD Signature Date/Time: 07/17/2021/12:40:07 PM    Final      Assessment/Plan:  Ascending 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  Patient has had intermittent fever for the past 3 weeks.  Every day he has a fever.  Has been on multiple antibiotics initially Unasyn then Zosyn and yesterday was started on meropenem.  leukocytosis has resolved and LFTs are much improved. Need to rule out drug fever versus other noninfectious causes   The MRI/MRCP done on 5/31 had shown some enhancement in the liver.  But that is not an abscess as per IR The plan would be to continue meropenem  today and if fever recurs in the morning  then I would stop the meropenem and watch him off antibiotics for couple of days. If fever continues then we need to do imaging like MRCP versus CT of the abdomen to look at the liver Currently there is no plan for PICC line or long-term antibiotics until we know what is going on with the patient   Cholecystectomy in January 2022.  Has had 2 episodes of cholangitis since then.  Coronary artery disease status post CABG  Status post TAVR 2D echo no obvious vegetation  Discussed the management with the patient and his wife at bedside in detail.  patient seen with hospitalist ID will follow him peripherally this weekend.  Call if needed.

## 2021-07-18 NOTE — Progress Notes (Signed)
   07/18/21 0248  Assess: MEWS Score  Temp (!) 102.9 F (39.4 C)  Assess: MEWS Score  MEWS Temp 2  MEWS Systolic 0  MEWS Pulse 0  MEWS RR 0  MEWS LOC 0  MEWS Score 2  MEWS Score Color Yellow  Assess: if the MEWS score is Yellow or Red  Were vital signs taken at a resting state? Yes  Focused Assessment Change from prior assessment (see assessment flowsheet)  Does the patient meet 2 or more of the SIRS criteria? Yes  Does the patient have a confirmed or suspected source of infection? No  Provider and Rapid Response Notified? No  MEWS guidelines implemented *See Row Information* Yes  Treat  MEWS Interventions Administered prn meds/treatments  Pain Scale 0-10  Pain Score 0  Take Vital Signs  Increase Vital Sign Frequency  Yellow: Q 2hr X 2 then Q 4hr X 2, if remains yellow, continue Q 4hrs  Notify: Charge Nurse/RN  Name of Charge Nurse/RN Notified Cherlynn Perches  Date Charge Nurse/RN Notified 07/18/21  Time Charge Nurse/RN Notified 0250  Notify: Provider  Provider Name/Title not at this time  Notify: Rapid Response  Name of Rapid Response RN Notified Not at this time  Assess: SIRS CRITERIA  SIRS Temperature  1  SIRS Pulse 1  SIRS Respirations  0  SIRS WBC 0  SIRS Score Sum  2

## 2021-07-18 NOTE — Progress Notes (Addendum)
Progress Note    Jacob Montes  JKK:[id number] DOB: 25-Oct-1940  DOA: 07/13/2021 PCP: Jerrol Banana., MD      Brief Narrative:    Medical records reviewed and are as summarized below:  [name] is a 81 y.o. male with medical history significant for CAD, aortic stenosis s/p TAVR, hypertension, hyperlipidemia, BPH, CAD, obesity, recent discharge from the hospital on 07/12/2021 after hospitalization on 07/08/2021 for acute cholangitis with choledocholithiasis s/p ERCP.  He presented to the hospital with fever despite being on oral antibiotics since discharge from the hospital. CT abdomen and pelvis was concerning for liver abscesses.     Assessment/Plan:   Principal Problem:   Fever Active Problems:   Elevated liver enzymes   Liver abscess   History of CAD   Morbid obesity (HCC)   Benign essential hypertension   Ascending cholangitis   Hyperlipidemia   CKD (chronic kidney disease)    Body mass index is 37.33 kg/m.  (Obesity)  Recurrent fever: Etiology unclear.  Liver abscesses unlikely per radiologist.  No growth on blood cultures thus far.  IV Zosyn has been changed to meropenem.  No vegetations on 2D echo. Lower extremity venous duplex did not show any DVT. Per Dr. Delaine Lame, continue IV meropenem for 24 hours and discontinue antibiotics (if fever recurs) to monitor patient off antibiotics.  Recent acute cholangitis, choledocholithiasis: S/p ERCP on 07/10/2021 with stone extraction.  Liver enzymes are better.  Appreciate input from GI.  GI has signed off.  Elevated liver enzymes: Improved.  Other comorbidities include hypertension, GERD   Diet Order             Diet Heart Room service appropriate? Yes; Fluid consistency: Thin  Diet effective now                            Consultants: Gastroenterologist, infectious disease  Procedures: None    Medications:    amLODipine  10 mg Oral Daily   enoxaparin (LOVENOX) injection  0.5  mg/kg Subcutaneous Q24H   metoprolol tartrate  12.5 mg Oral BID   multivitamin with minerals  1 tablet Oral Daily   pantoprazole  40 mg Oral Daily   Continuous Infusions:  meropenem (MERREM) IV 1 g (07/18/21 1501)     Anti-infectives (From admission, onward)    Start     Dose/Rate Route Frequency Ordered Stop   07/17/21 1400  meropenem (MERREM) 1 g in sodium chloride 0.9 % 100 mL IVPB        1 g 200 mL/hr over 30 Minutes Intravenous Every 8 hours 07/17/21 1055     07/14/21 2200  Ampicillin-Sulbactam (UNASYN) 3 g in sodium chloride 0.9 % 100 mL IVPB  Status:  Discontinued        3 g 200 mL/hr over 30 Minutes Intravenous Every 6 hours 07/14/21 1331 07/14/21 1456   07/14/21 1500  piperacillin-tazobactam (ZOSYN) IVPB 3.375 g  Status:  Discontinued        3.375 g 12.5 mL/hr over 240 Minutes Intravenous Every 8 hours 07/14/21 1456 07/17/21 1055   07/14/21 1200  cefTRIAXone (ROCEPHIN) 2 g in sodium chloride 0.9 % 100 mL IVPB  Status:  Discontinued        2 g 200 mL/hr over 30 Minutes Intravenous Every 24 hours 07/14/21 0724 07/14/21 1323   07/14/21 1000  metroNIDAZOLE (FLAGYL) IVPB 500 mg  Status:  Discontinued  500 mg 100 mL/hr over 60 Minutes Intravenous Every 12 hours 07/14/21 0724 07/14/21 1323   07/13/21 2145  piperacillin-tazobactam (ZOSYN) IVPB 3.375 g        3.375 g 100 mL/hr over 30 Minutes Intravenous  Once 07/13/21 2130 07/13/21 2300   07/13/21 2145  cefTRIAXone (ROCEPHIN) 1 g in sodium chloride 0.9 % 100 mL IVPB        1 g 200 mL/hr over 30 Minutes Intravenous  Once 07/13/21 2130 07/13/21 2227              Family Communication/Anticipated D/C date and plan/Code Status   DVT prophylaxis:      Code Status: Full Code  Family Communication: Plan discussed with his wife at the bedside Disposition Plan: Plan to discharge home in 1 to 2 days   Status is: Inpatient Remains inpatient appropriate because: IV antibiotics       Subjective:   He had  fever overnight with Tmax of 102.9.  No abdominal pain or vomiting.  His wife is at the bedside.  Patient was seen together with Dr. Delaine Lame, [place].  Objective:    Vitals:   07/18/21 0437 07/18/21 0450 07/18/21 0957 07/18/21 1632  BP:  (!) 128/51 (!) 111/43 (!) 130/46  Pulse:  71 70 66  Resp:  '19 18 19  '$ Temp: (!) 100.6 F (38.1 C) 100.1 F (37.8 C) 97.6 F (36.4 C) 99.1 F (37.3 C)  TempSrc: Oral  Oral Oral  SpO2:  94% 95% 94%  Weight:      Height:       No data found.   Intake/Output Summary (Last 24 hours) at 07/18/2021 1720 Last data filed at 07/18/2021 1047 Gross per 24 hour  Intake 680 ml  Output 300 ml  Net 380 ml   Filed Weights   07/13/21 1956 07/13/21 2354  Weight: 104.8 kg 104.9 kg    Exam:  GEN: NAD SKIN: No rash EYES: EOMI ENT: MMM CV: RRR PULM: CTA B ABD: soft, ND, NT, +BS CNS: AAO x 3, non focal EXT: Mild bilateral leg edema.  No erythema or tenderness     Data Reviewed:   I have personally reviewed following labs and imaging studies:  Labs: Labs show the following:   Basic Metabolic Panel: Recent Labs  Lab 07/12/21 0318 07/13/21 2007 07/14/21 0717 07/16/21 1540  NA 137 135 138 136  K 4.2 3.8 3.5 3.9  CL 105 104 106 103  CO2 '27 23 27 28  '$ GLUCOSE 101* 208* 119* 107*  BUN '15 17 14 14  '$ CREATININE 0.83 0.74 0.59* 0.69  CALCIUM 8.9 8.9 8.8* 8.6*  MG 2.1  --   --   --   PHOS 3.1  --   --   --    GFR Estimated Creatinine Clearance: 83.5 mL/min (by C-G formula based on SCr of 0.69 mg/dL). Liver Function Tests: Recent Labs  Lab 07/12/21 0318 07/13/21 2007 07/14/21 0717 07/16/21 1540 07/18/21 1034  AST 173* 65* 47* 52* 49*  ALT 217* 127* 100* 72* 61*  ALKPHOS 240* 218* 173* 142* 132*  BILITOT 1.4* 1.4* 0.8 0.6 1.0  PROT 6.3* 7.0 6.1* 6.2* 6.4*  ALBUMIN 3.0* 3.3* 2.9* 3.0* 2.9*   Recent Labs  Lab 07/12/21 0318  LIPASE 16   No results for input(s): "AMMONIA" in the last 168 hours. Coagulation profile No results for  input(s): "INR", "PROTIME" in the last 168 hours.  CBC: Recent Labs  Lab 07/13/21 2007 07/14/21 2440 07/15/21 0503  07/16/21 1540 07/18/21 1034  WBC 11.2* 8.8 11.2* 7.3 9.0  NEUTROABS 8.4*  --   --  4.0 5.9  HGB 12.7* 11.4* 11.6* 11.2* 11.6*  HCT 35.3* 33.1* 33.7* 33.1* 34.9*  MCV 94.6 93.8 95.5 95.9 96.7  PLT 246 217 229 242 306   Cardiac Enzymes: No results for input(s): "CKTOTAL", "CKMB", "CKMBINDEX", "TROPONINI" in the last 168 hours. BNP (last 3 results) No results for input(s): "PROBNP" in the last 8760 hours. CBG: No results for input(s): "GLUCAP" in the last 168 hours. D-Dimer: No results for input(s): "DDIMER" in the last 72 hours. Hgb A1c: No results for input(s): "HGBA1C" in the last 72 hours. Lipid Profile: No results for input(s): "CHOL", "HDL", "LDLCALC", "TRIG", "CHOLHDL", "LDLDIRECT" in the last 72 hours. Thyroid function studies: Recent Labs    07/18/21 1034  TSH 0.592   Anemia work up: No results for input(s): "VITAMINB12", "FOLATE", "FERRITIN", "TIBC", "IRON", "RETICCTPCT" in the last 72 hours. Sepsis Labs: Recent Labs  Lab 07/13/21 2007 07/14/21 0717 07/15/21 0503 07/16/21 1540 07/18/21 1034  PROCALCITON  --   --   --  0.14 <0.10  WBC 11.2* 8.8 11.2* 7.3 9.0  LATICACIDVEN 1.7  --   --   --   --     Microbiology Recent Results (from the past 240 hour(s))  Culture, blood (Routine X 2) w Reflex to ID Panel     Status: None   Collection Time: 07/11/21  3:51 PM   Specimen: BLOOD  Result Value Ref Range Status   Specimen Description BLOOD BLOOD LEFT HAND  Final   Special Requests   Final    BOTTLES DRAWN AEROBIC AND ANAEROBIC Blood Culture adequate volume   Culture   Final    NO GROWTH 5 DAYS Performed at [facility], [address] [place], [place] [address]    Report Status 07/16/2021 FINAL  Final  Culture, blood (Routine X 2) w Reflex to ID Panel     Status: None   Collection Time: 07/11/21  4:04 PM   Specimen: BLOOD   Result Value Ref Range Status   Specimen Description BLOOD BLOOD RIGHT HAND  Final   Special Requests   Final    BOTTLES DRAWN AEROBIC AND ANAEROBIC Blood Culture adequate volume   Culture   Final    NO GROWTH 5 DAYS Performed at [facility], [address], [place], [place] [address]    Report Status 07/16/2021 FINAL  Final  Blood culture (routine x 2)     Status: None   Collection Time: 07/13/21  8:07 PM   Specimen: BLOOD  Result Value Ref Range Status   Specimen Description BLOOD RIGHT HAND  Final   Special Requests   Final    BOTTLES DRAWN AEROBIC AND ANAEROBIC Blood Culture adequate volume   Culture   Final    NO GROWTH 5 DAYS Performed at [facility], [address] [place], [place] [address]    Report Status 07/18/2021 FINAL  Final  Blood culture (routine x 2)     Status: None   Collection Time: 07/13/21  9:33 PM   Specimen: BLOOD  Result Value Ref Range Status   Specimen Description BLOOD RIGHT FA  Final   Special Requests   Final    BOTTLES DRAWN AEROBIC AND ANAEROBIC Blood Culture adequate volume   Culture   Final    NO GROWTH 5 DAYS Performed at [facility], [address], [place], [place] [address]  Report Status 07/18/2021 FINAL  Final  Culture, blood (Routine X 2) w Reflex to ID Panel     Status: None (Preliminary result)   Collection Time: 07/17/21 10:21 AM   Specimen: BLOOD LEFT HAND  Result Value Ref Range Status   Specimen Description BLOOD LEFT HAND  Final   Special Requests   Final    BOTTLES DRAWN AEROBIC AND ANAEROBIC Blood Culture results may not be optimal due to an excessive volume of blood received in culture bottles   Culture   Final    NO GROWTH < 24 HOURS Performed at [facility], [address] [place], [place] [address]    Report Status PENDING  Incomplete  Culture, blood (Routine X 2) w Reflex to ID Panel     Status: None (Preliminary result)   Collection Time: 07/17/21 10:21 AM    Specimen: BLOOD LEFT ARM  Result Value Ref Range Status   Specimen Description BLOOD LEFT ARM  Final   Special Requests   Final    BOTTLES DRAWN AEROBIC AND ANAEROBIC Blood Culture results may not be optimal due to an excessive volume of blood received in culture bottles   Culture   Final    NO GROWTH < 24 HOURS Performed at [facility], [address] [place], [place] [address]    Report Status PENDING  Incomplete    Procedures and diagnostic studies:  US Venous Img Lower Bilateral (DVT)  Result Date: 07/18/2021 CLINICAL DATA:  Bilateral lower extremity swelling EXAM: BILATERAL LOWER EXTREMITY VENOUS DOPPLER ULTRASOUND TECHNIQUE: Gray-scale sonography with graded compression, as well as color Doppler and duplex ultrasound were performed to evaluate the lower extremity deep venous systems from the level of the common femoral vein and including the common femoral, femoral, profunda femoral, popliteal and calf veins including the posterior tibial, peroneal and gastrocnemius veins when visible. Spectral Doppler was utilized to evaluate flow at rest and with distal augmentation maneuvers in the common femoral, femoral and popliteal veins. COMPARISON:  None Available. FINDINGS: RIGHT LOWER EXTREMITY Common Femoral Vein: No evidence of thrombus. Normal compressibility, respiratory phasicity and response to augmentation. Saphenofemoral Junction: No evidence of thrombus. Normal compressibility and flow on color Doppler imaging. Profunda Femoral Vein: No evidence of thrombus. Normal compressibility and flow on color Doppler imaging. Femoral Vein: No evidence of thrombus. Normal compressibility, respiratory phasicity and response to augmentation. Popliteal Vein: No evidence of thrombus. Normal compressibility, respiratory phasicity and response to augmentation. Calf Veins: No evidence of thrombus. Normal compressibility and flow on color Doppler imaging. Other Findings: Right popliteal fossa Baker's  cyst measures 1.6 x 2.3 x 0.8 cm LEFT LOWER EXTREMITY Common Femoral Vein: No evidence of thrombus. Normal compressibility, respiratory phasicity and response to augmentation. Saphenofemoral Junction: No evidence of thrombus. Normal compressibility and flow on color Doppler imaging. Profunda Femoral Vein: No evidence of thrombus. Normal compressibility and flow on color Doppler imaging. Femoral Vein: No evidence of thrombus. Normal compressibility, respiratory phasicity and response to augmentation. Popliteal Vein: No evidence of thrombus. Normal compressibility, respiratory phasicity and response to augmentation. Calf Veins: No evidence of thrombus. Normal compressibility and flow on color Doppler imaging. IMPRESSION: No evidence of deep venous thrombosis in either lower extremity. Electronically Signed   By: Jerilynn Mages.  Shick M.D.   On: 07/18/2021 16:23   ECHOCARDIOGRAM COMPLETE  Result Date: 07/17/2021    ECHOCARDIOGRAM REPORT   Patient Name:   TREVONN HALLUM Date of Exam: 07/17/2021 Medical Rec #:  [id number]   Height:  66.0 in Accession #:    [id number]  Weight:       231.3 lb Date of Birth:  08/29/1940   BSA:          2.127 m Patient Age:    82 years    BP:           161/57 mmHg Patient Gender: M           HR:           69 bpm. Exam Location:  [facility] Procedure: 2D Echo, Cardiac Doppler and Color Doppler Indications:     R50.9 Fever  History:         Patient has prior history of Echocardiogram examinations, most                  recent 01/16/2021. CAD, Prior CABG, Stroke; Risk                  Factors:Hypertension, Dyslipidemia and Sleep Apnea. S/P TAVR                  03/2017.  Sonographer:     Charmayne Sheer Referring Phys:  [id number] Tsosie Billing Diagnosing Phys: Ida Rogue MD  Sonographer Comments: Technically difficult study due to poor echo windows. Image acquisition challenging due to patient body habitus. IMPRESSIONS  1. No valve vegetation, though challenging images  2. Left ventricular ejection  fraction, by estimation, is 60 to 65%. The left ventricle has normal function. The left ventricle has no regional wall motion abnormalities. Left ventricular diastolic parameters are consistent with Grade II diastolic dysfunction (pseudonormalization).  3. Right ventricular systolic function is normal. The right ventricular size is normal. Tricuspid regurgitation signal is inadequate for assessing PA pressure.  4. Left atrial size was moderately dilated.  5. The mitral valve is normal in structure. Mild to moderate mitral valve regurgitation. No evidence of mitral stenosis.  6. The aortic valve was not well visualized. Aortic valve regurgitation is not visualized. Moderate aortic valve stenosis. Aortic valve area, by VTI measures 0.99 cm. Aortic valve mean gradient measures 23.0 mmHg. Aortic valve Vmax measures 3.32 m/s.  7. The inferior vena cava is normal in size with greater than 50% respiratory variability, suggesting right atrial pressure of 3 mmHg. FINDINGS  Left Ventricle: Left ventricular ejection fraction, by estimation, is 60 to 65%. The left ventricle has normal function. The left ventricle has no regional wall motion abnormalities. The left ventricular internal cavity size was normal in size. There is  no left ventricular hypertrophy. Left ventricular diastolic parameters are consistent with Grade II diastolic dysfunction (pseudonormalization). Right Ventricle: The right ventricular size is normal. No increase in right ventricular wall thickness. Right ventricular systolic function is normal. Tricuspid regurgitation signal is inadequate for assessing PA pressure. Left Atrium: Left atrial size was moderately dilated. Right Atrium: Right atrial size was normal in size. Pericardium: There is no evidence of pericardial effusion. Mitral Valve: The mitral valve is normal in structure. Mild to moderate mitral valve regurgitation. No evidence of mitral valve stenosis. MV peak gradient, 8.9 mmHg. The mean mitral  valve gradient is 3.0 mmHg. Tricuspid Valve: The tricuspid valve is normal in structure. Tricuspid valve regurgitation is not demonstrated. No evidence of tricuspid stenosis. Aortic Valve: The aortic valve was not well visualized. Aortic valve regurgitation is not visualized. Moderate aortic stenosis is present. Aortic valve mean gradient measures 23.0 mmHg. Aortic valve peak gradient measures 44.1 mmHg. Aortic valve area, by  VTI measures 0.99  cm. Pulmonic Valve: The pulmonic valve was normal in structure. Pulmonic valve regurgitation is not visualized. No evidence of pulmonic stenosis. Aorta: The aortic root is normal in size and structure. Venous: The inferior vena cava is normal in size with greater than 50% respiratory variability, suggesting right atrial pressure of 3 mmHg. IAS/Shunts: No atrial level shunt detected by color flow Doppler.  LEFT VENTRICLE PLAX 2D LVIDd:         5.66 cm   Diastology LVIDs:         4.38 cm   LV e' medial:    5.00 cm/s LV PW:         1.04 cm   LV E/e' medial:  25.6 LV IVS:        0.99 cm   LV e' lateral:   7.72 cm/s LVOT diam:     1.90 cm   LV E/e' lateral: 16.6 LV SV:         71 LV SV Index:   33 LVOT Area:     2.84 cm  RIGHT VENTRICLE RV Basal diam:  3.64 cm RV S prime:     13.50 cm/s LEFT ATRIUM             Index LA diam:        5.70 cm 2.68 cm/m LA Vol (A2C):   63.8 ml 29.99 ml/m LA Vol (A4C):   98.8 ml 46.45 ml/m LA Biplane Vol: 84.4 ml 39.68 ml/m  AORTIC VALVE                     PULMONIC VALVE AV Area (Vmax):    1.03 cm      PV Vmax:       1.33 m/s AV Area (Vmean):   1.04 cm      PV Vmean:      85.600 cm/s AV Area (VTI):     0.99 cm      PV VTI:        0.258 m AV Vmax:           332.00 cm/s   PV Peak grad:  7.1 mmHg AV Vmean:          228.000 cm/s  PV Mean grad:  3.0 mmHg AV VTI:            0.722 m AV Peak Grad:      44.1 mmHg AV Mean Grad:      23.0 mmHg LVOT Vmax:         121.00 cm/s LVOT Vmean:        83.900 cm/s LVOT VTI:          0.251 m LVOT/AV VTI ratio:  0.35  AORTA Ao Root diam: 3.40 cm MITRAL VALVE MV Area (PHT): 3.10 cm     SHUNTS MV Area VTI:   1.65 cm     Systemic VTI:  0.25 m MV Peak grad:  8.9 mmHg     Systemic Diam: 1.90 cm MV Mean grad:  3.0 mmHg MV Vmax:       1.49 m/s MV Vmean:      85.5 cm/s MV Decel Time: 245 msec MV E velocity: 128.00 cm/s MV A velocity: 101.00 cm/s MV E/A ratio:  1.27 Ida Rogue MD Electronically signed by Ida Rogue MD Signature Date/Time: 07/17/2021/12:40:07 PM    Final                LOS: 5 days   Jennye Boroughs  [facility]  Pager on [email or web address]. If 7PM-7AM, please contact night-coverage at [email or web address]     07/18/2021, 5:20 PM

## 2021-07-18 NOTE — Care Management Important Message (Signed)
Important Message  Patient Details  Name: Jacob Montes MRN: [id number] Date of Birth: [birth year]   Medicare Important Message Given:  Yes     Dannette Barbara 07/18/2021, 11:21 AM

## 2021-07-18 NOTE — Progress Notes (Signed)
   07/18/21 0730  Assess: MEWS Score  Temp 97.8 F (36.6 C)  BP (!) 120/52  Pulse Rate 72  Level of Consciousness Alert  SpO2 94 %  O2 Device Room Air  Assess: MEWS Score  MEWS Temp 0  MEWS Systolic 0  MEWS Pulse 0  MEWS RR 0  MEWS LOC 0  MEWS Score 0  MEWS Score Color Green  Assess: SIRS CRITERIA  SIRS Temperature  0  SIRS Pulse 0  SIRS Respirations  0  SIRS WBC 0  SIRS Score Sum  0   Patient stable. Will continue to montior.

## 2021-07-19 DIAGNOSIS — R509 Fever, unspecified: Secondary | ICD-10-CM | POA: Diagnosis not present

## 2021-07-19 DIAGNOSIS — K750 Abscess of liver: Secondary | ICD-10-CM | POA: Diagnosis not present

## 2021-07-19 LAB — ANA COMPREHENSIVE PANEL

## 2021-07-19 LAB — FUNGITELL, SERUM: Fungitell Result: <31 pg/mL

## 2021-07-19 MED ORDER — MEROPENEM 1 G IV SOLR
1.0000 g | Freq: Three times a day (TID) | INTRAVENOUS | Status: DC
  Administered 2021-07-19 – 2021-07-22 (×10): 1 g via INTRAVENOUS
  Filled 2021-07-19 (×5): qty 20
  Filled 2021-07-19: qty 1
  Filled 2021-07-19 (×5): qty 20

## 2021-07-19 NOTE — Progress Notes (Signed)
Progress Note    Jacob Montes  DSK:[id number] DOB: [birth year]  DOA: 07/13/2021 PCP: Jerrol Banana., MD      Brief Narrative:    Medical records reviewed and are as summarized below:  Jacob Montes is a 81 y.o. male with medical history significant for CAD, aortic stenosis s/p TAVR, hypertension, hyperlipidemia, BPH, CAD, obesity, recent discharge from the hospital on 07/12/2021 after hospitalization on 07/08/2021 for acute cholangitis with choledocholithiasis s/p ERCP.  He presented to the hospital with fever despite being on oral antibiotics since discharge from the hospital. CT abdomen and pelvis was concerning for liver abscesses.     Assessment/Plan:   Principal Problem:   Fever Active Problems:   Elevated liver enzymes   Liver abscess   History of CAD   Morbid obesity (HCC)   Benign essential hypertension   Ascending cholangitis   Hyperlipidemia   CKD (chronic kidney disease)    Body mass index is 37.33 kg/m.  (Obesity)  Recurrent fever: Etiology unclear.  Liver abscesses unlikely per radiologist.   No growth on blood cultures thus far. No vegetations on 2D echo. Lower extremity venous duplex did not show any DVT. Continue IV meropenem for now since patient was afebrile overnight.  Plan to repeat abdominal imaging on 07/21/2021.  Case was discussed with Dr. Delaine Lame, [place] specialist, today.  Patient and his wife have been updated on the plan of care.  Recent acute cholangitis, choledocholithiasis: S/p ERCP on 07/10/2021 with stone extraction.  Liver enzymes are better.  Appreciate input from GI.  GI has signed off.  Elevated liver enzymes: Improved.  Other comorbidities include hypertension, GERD   Diet Order             Diet Heart Room service appropriate? Yes; Fluid consistency: Thin  Diet effective now                            Consultants: Gastroenterologist, infectious disease  Procedures: None    Medications:     amLODipine  10 mg Oral Daily   enoxaparin (LOVENOX) injection  0.5 mg/kg Subcutaneous Q24H   metoprolol tartrate  12.5 mg Oral BID   multivitamin with minerals  1 tablet Oral Daily   pantoprazole  40 mg Oral Daily   Continuous Infusions:  meropenem (MERREM) IV 1 g (07/19/21 1348)     Anti-infectives (From admission, onward)    Start     Dose/Rate Route Frequency Ordered Stop   07/19/21 1400  meropenem (MERREM) 1 g in sodium chloride 0.9 % 100 mL IVPB        1 g 200 mL/hr over 30 Minutes Intravenous Every 8 hours 07/19/21 1031     07/17/21 1400  meropenem (MERREM) 1 g in sodium chloride 0.9 % 100 mL IVPB  Status:  Discontinued        1 g 200 mL/hr over 30 Minutes Intravenous Every 8 hours 07/17/21 1055 07/19/21 0936   07/14/21 2200  Ampicillin-Sulbactam (UNASYN) 3 g in sodium chloride 0.9 % 100 mL IVPB  Status:  Discontinued        3 g 200 mL/hr over 30 Minutes Intravenous Every 6 hours 07/14/21 1331 07/14/21 1456   07/14/21 1500  piperacillin-tazobactam (ZOSYN) IVPB 3.375 g  Status:  Discontinued        3.375 g 12.5 mL/hr over 240 Minutes Intravenous Every 8 hours 07/14/21 1456 07/17/21 1055   07/14/21 1200  cefTRIAXone (ROCEPHIN) 2  g in sodium chloride 0.9 % 100 mL IVPB  Status:  Discontinued        2 g 200 mL/hr over 30 Minutes Intravenous Every 24 hours 07/14/21 0724 07/14/21 1323   07/14/21 1000  metroNIDAZOLE (FLAGYL) IVPB 500 mg  Status:  Discontinued        500 mg 100 mL/hr over 60 Minutes Intravenous Every 12 hours 07/14/21 0724 07/14/21 1323   07/13/21 2145  piperacillin-tazobactam (ZOSYN) IVPB 3.375 g        3.375 g 100 mL/hr over 30 Minutes Intravenous  Once 07/13/21 2130 07/13/21 2300   07/13/21 2145  cefTRIAXone (ROCEPHIN) 1 g in sodium chloride 0.9 % 100 mL IVPB        1 g 200 mL/hr over 30 Minutes Intravenous  Once 07/13/21 2130 07/13/21 2227              Family Communication/Anticipated D/C date and plan/Code Status   DVT prophylaxis:      Code  Status: Full Code  Family Communication: Plan discussed with his wife at the bedside Disposition Plan: Plan to discharge home in 2 days   Status is: Inpatient Remains inpatient appropriate because: IV antibiotics       Subjective:   Interval events noted.  No fever or chills overnight.  No abdominal pain.  Objective:    Vitals:   07/19/21 0300 07/19/21 0437 07/19/21 0806 07/19/21 0817  BP:  140/74 (!) 129/47 (!) 134/52  Pulse:  86 69 65  Resp:  (!) '22 20 18  '$ Temp: 98.6 F (37 C) 99.3 F (37.4 C) 98.9 F (37.2 C) 98.6 F (37 C)  TempSrc:  Oral Oral Oral  SpO2:  94% 92% 91%  Weight:      Height:       No data found.   Intake/Output Summary (Last 24 hours) at 07/19/2021 1400 Last data filed at 07/19/2021 1348 Gross per 24 hour  Intake 360 ml  Output --  Net 360 ml   Filed Weights   07/13/21 1956 07/13/21 2354  Weight: 104.8 kg 104.9 kg    Exam:  GEN: NAD SKIN: No rash EYES: EOMI ENT: MMM CV: RRR PULM: CTA B ABD: soft, ND, NT, +BS CNS: AAO x 3, non focal EXT: B/l leg edema, no tenderness     Data Reviewed:   I have personally reviewed following labs and imaging studies:  Labs: Labs show the following:   Basic Metabolic Panel: Recent Labs  Lab 07/13/21 2007 07/14/21 0717 07/16/21 1540  NA 135 138 136  K 3.8 3.5 3.9  CL 104 106 103  CO2 '23 27 28  '$ GLUCOSE 208* 119* 107*  BUN '17 14 14  '$ CREATININE 0.74 0.59* 0.69  CALCIUM 8.9 8.8* 8.6*   GFR Estimated Creatinine Clearance: 83.5 mL/min (by C-G formula based on SCr of 0.69 mg/dL). Liver Function Tests: Recent Labs  Lab 07/13/21 2007 07/14/21 0717 07/16/21 1540 07/18/21 1034  AST 65* 47* 52* 49*  ALT 127* 100* 72* 61*  ALKPHOS 218* 173* 142* 132*  BILITOT 1.4* 0.8 0.6 1.0  PROT 7.0 6.1* 6.2* 6.4*  ALBUMIN 3.3* 2.9* 3.0* 2.9*   No results for input(s): "LIPASE", "AMYLASE" in the last 168 hours.  No results for input(s): "AMMONIA" in the last 168 hours. Coagulation profile No  results for input(s): "INR", "PROTIME" in the last 168 hours.  CBC: Recent Labs  Lab 07/13/21 2007 07/14/21 0717 07/15/21 0503 07/16/21 1540 07/18/21 1034  WBC 11.2* 8.8 11.2* 7.3  9.0  NEUTROABS 8.4*  --   --  4.0 5.9  HGB 12.7* 11.4* 11.6* 11.2* 11.6*  HCT 35.3* 33.1* 33.7* 33.1* 34.9*  MCV 94.6 93.8 95.5 95.9 96.7  PLT 246 217 229 242 306   Cardiac Enzymes: No results for input(s): "CKTOTAL", "CKMB", "CKMBINDEX", "TROPONINI" in the last 168 hours. BNP (last 3 results) No results for input(s): "PROBNP" in the last 8760 hours. CBG: No results for input(s): "GLUCAP" in the last 168 hours. D-Dimer: No results for input(s): "DDIMER" in the last 72 hours. Hgb A1c: No results for input(s): "HGBA1C" in the last 72 hours. Lipid Profile: No results for input(s): "CHOL", "HDL", "LDLCALC", "TRIG", "CHOLHDL", "LDLDIRECT" in the last 72 hours. Thyroid function studies: Recent Labs    07/18/21 1034  TSH 0.592   Anemia work up: No results for input(s): "VITAMINB12", "FOLATE", "FERRITIN", "TIBC", "IRON", "RETICCTPCT" in the last 72 hours. Sepsis Labs: Recent Labs  Lab 07/13/21 2007 07/14/21 0717 07/15/21 0503 07/16/21 1540 07/18/21 1034  PROCALCITON  --   --   --  0.14 <0.10  WBC 11.2* 8.8 11.2* 7.3 9.0  LATICACIDVEN 1.7  --   --   --   --     Microbiology Recent Results (from the past 240 hour(s))  Culture, blood (Routine X 2) w Reflex to ID Panel     Status: None   Collection Time: 07/11/21  3:51 PM   Specimen: BLOOD  Result Value Ref Range Status   Specimen Description BLOOD BLOOD LEFT HAND  Final   Special Requests   Final    BOTTLES DRAWN AEROBIC AND ANAEROBIC Blood Culture adequate volume   Culture   Final    NO GROWTH 5 DAYS Performed at [facility], [address] [place], [place] [address]    Report Status 07/16/2021 FINAL  Final  Culture, blood (Routine X 2) w Reflex to ID Panel     Status: None   Collection Time: 07/11/21  4:04 PM   Specimen:  BLOOD  Result Value Ref Range Status   Specimen Description BLOOD BLOOD RIGHT HAND  Final   Special Requests   Final    BOTTLES DRAWN AEROBIC AND ANAEROBIC Blood Culture adequate volume   Culture   Final    NO GROWTH 5 DAYS Performed at [facility], [address] [place], [place] [address]    Report Status 07/16/2021 FINAL  Final  Blood culture (routine x 2)     Status: None   Collection Time: 07/13/21  8:07 PM   Specimen: BLOOD  Result Value Ref Range Status   Specimen Description BLOOD RIGHT HAND  Final   Special Requests   Final    BOTTLES DRAWN AEROBIC AND ANAEROBIC Blood Culture adequate volume   Culture   Final    NO GROWTH 5 DAYS Performed at [facility], [address], [place], [place] [address]    Report Status 07/18/2021 FINAL  Final  Blood culture (routine x 2)     Status: None   Collection Time: 07/13/21  9:33 PM   Specimen: BLOOD  Result Value Ref Range Status   Specimen Description BLOOD RIGHT FA  Final   Special Requests   Final    BOTTLES DRAWN AEROBIC AND ANAEROBIC Blood Culture adequate volume   Culture   Final    NO GROWTH 5 DAYS Performed at [facility], [address], [place], [place] [address]    Report Status 07/18/2021 FINAL  Final  Culture,  blood (Routine X 2) w Reflex to ID Panel     Status: None (Preliminary result)   Collection Time: 07/17/21 10:21 AM   Specimen: BLOOD LEFT HAND  Result Value Ref Range Status   Specimen Description BLOOD LEFT HAND  Final   Special Requests   Final    BOTTLES DRAWN AEROBIC AND ANAEROBIC Blood Culture results may not be optimal due to an excessive volume of blood received in culture bottles   Culture   Final    NO GROWTH < 24 HOURS Performed at [facility], [address], [place], [place] [address]    Report Status PENDING  Incomplete  Culture, blood (Routine X 2) w Reflex to ID Panel     Status: None (Preliminary result)   Collection Time: 07/17/21 10:21 AM    Specimen: BLOOD LEFT ARM  Result Value Ref Range Status   Specimen Description BLOOD LEFT ARM  Final   Special Requests   Final    BOTTLES DRAWN AEROBIC AND ANAEROBIC Blood Culture results may not be optimal due to an excessive volume of blood received in culture bottles   Culture   Final    NO GROWTH < 24 HOURS Performed at [facility], [address] [place], [place] [address]    Report Status PENDING  Incomplete    Procedures and diagnostic studies:  US Venous Img Lower Bilateral (DVT)  Result Date: 07/18/2021 CLINICAL DATA:  Bilateral lower extremity swelling EXAM: BILATERAL LOWER EXTREMITY VENOUS DOPPLER ULTRASOUND TECHNIQUE: Gray-scale sonography with graded compression, as well as color Doppler and duplex ultrasound were performed to evaluate the lower extremity deep venous systems from the level of the common femoral vein and including the common femoral, femoral, profunda femoral, popliteal and calf veins including the posterior tibial, peroneal and gastrocnemius veins when visible. Spectral Doppler was utilized to evaluate flow at rest and with distal augmentation maneuvers in the common femoral, femoral and popliteal veins. COMPARISON:  None Available. FINDINGS: RIGHT LOWER EXTREMITY Common Femoral Vein: No evidence of thrombus. Normal compressibility, respiratory phasicity and response to augmentation. Saphenofemoral Junction: No evidence of thrombus. Normal compressibility and flow on color Doppler imaging. Profunda Femoral Vein: No evidence of thrombus. Normal compressibility and flow on color Doppler imaging. Femoral Vein: No evidence of thrombus. Normal compressibility, respiratory phasicity and response to augmentation. Popliteal Vein: No evidence of thrombus. Normal compressibility, respiratory phasicity and response to augmentation. Calf Veins: No evidence of thrombus. Normal compressibility and flow on color Doppler imaging. Other Findings: Right popliteal fossa Baker's  cyst measures 1.6 x 2.3 x 0.8 cm LEFT LOWER EXTREMITY Common Femoral Vein: No evidence of thrombus. Normal compressibility, respiratory phasicity and response to augmentation. Saphenofemoral Junction: No evidence of thrombus. Normal compressibility and flow on color Doppler imaging. Profunda Femoral Vein: No evidence of thrombus. Normal compressibility and flow on color Doppler imaging. Femoral Vein: No evidence of thrombus. Normal compressibility, respiratory phasicity and response to augmentation. Popliteal Vein: No evidence of thrombus. Normal compressibility, respiratory phasicity and response to augmentation. Calf Veins: No evidence of thrombus. Normal compressibility and flow on color Doppler imaging. IMPRESSION: No evidence of deep venous thrombosis in either lower extremity. Electronically Signed   By: Jerilynn Mages.  Shick M.D.   On: 07/18/2021 16:23               LOS: 6 days   Kilynn Fitzsimmons  [facility]   Pager on [email or web address]. If 7PM-7AM, please contact night-coverage at [email or web address]     07/19/2021, 2:00  PM

## 2021-07-20 DIAGNOSIS — R509 Fever, unspecified: Secondary | ICD-10-CM | POA: Diagnosis not present

## 2021-07-20 DIAGNOSIS — K750 Abscess of liver: Secondary | ICD-10-CM | POA: Diagnosis not present

## 2021-07-20 LAB — CREATININE, SERUM
Creatinine, Ser: 0.49 mg/dL — ABNORMAL LOW (ref 0.61–1.24)
GFR, Estimated: >60 mL/min (ref >60)

## 2021-07-20 NOTE — Progress Notes (Signed)
Progress Note    Jacob Montes  GQQ:[id number] DOB: 1940-06-07  DOA: 07/13/2021 PCP: Jerrol Banana., MD      Brief Narrative:    Medical records reviewed and are as summarized below:  Jacob Montes is a 81 y.o. male with medical history significant for CAD, aortic stenosis s/p TAVR, hypertension, hyperlipidemia, BPH, CAD, obesity, recent discharge from the hospital on 07/12/2021 after hospitalization on 07/08/2021 for acute cholangitis with choledocholithiasis s/p ERCP.  He presented to the hospital with fever despite being on oral antibiotics since discharge from the hospital. CT abdomen and pelvis was concerning for liver abscesses.     Assessment/Plan:   Principal Problem:   Fever Active Problems:   Elevated liver enzymes   Liver abscess   History of CAD   Morbid obesity (HCC)   Benign essential hypertension   Ascending cholangitis   Hyperlipidemia   CKD (chronic kidney disease)    Body mass index is 37.33 kg/m.  (Obesity)  Recurrent fever: Etiology unclear.  Liver abscesses unlikely per radiologist.   No growth on blood cultures thus far. No vegetations on 2D echo. Lower extremity venous duplex did not show any DVT. Continue IV meropenem.  Plan to repeat abdominal imaging tomorrow.  Follow-up with ID specialist, Dr. Steva Ready, for further recommendations.  Recent acute cholangitis, choledocholithiasis: S/p ERCP on 07/10/2021 with stone extraction.  Liver enzymes are better.  Appreciate input from GI.  GI has signed off.  Elevated liver enzymes: Improved.  Other comorbidities include hypertension, GERD   Diet Order             Diet Heart Room service appropriate? Yes; Fluid consistency: Thin  Diet effective now                            Consultants: Gastroenterologist, infectious disease  Procedures: None    Medications:    amLODipine  10 mg Oral Daily   enoxaparin (LOVENOX) injection  0.5 mg/kg Subcutaneous Q24H   metoprolol  tartrate  12.5 mg Oral BID   multivitamin with minerals  1 tablet Oral Daily   pantoprazole  40 mg Oral Daily   Continuous Infusions:  meropenem (MERREM) IV 1 g (07/20/21 0603)     Anti-infectives (From admission, onward)    Start     Dose/Rate Route Frequency Ordered Stop   07/19/21 1400  meropenem (MERREM) 1 g in sodium chloride 0.9 % 100 mL IVPB        1 g 200 mL/hr over 30 Minutes Intravenous Every 8 hours 07/19/21 1031     07/17/21 1400  meropenem (MERREM) 1 g in sodium chloride 0.9 % 100 mL IVPB  Status:  Discontinued        1 g 200 mL/hr over 30 Minutes Intravenous Every 8 hours 07/17/21 1055 07/19/21 0936   07/14/21 2200  Ampicillin-Sulbactam (UNASYN) 3 g in sodium chloride 0.9 % 100 mL IVPB  Status:  Discontinued        3 g 200 mL/hr over 30 Minutes Intravenous Every 6 hours 07/14/21 1331 07/14/21 1456   07/14/21 1500  piperacillin-tazobactam (ZOSYN) IVPB 3.375 g  Status:  Discontinued        3.375 g 12.5 mL/hr over 240 Minutes Intravenous Every 8 hours 07/14/21 1456 07/17/21 1055   07/14/21 1200  cefTRIAXone (ROCEPHIN) 2 g in sodium chloride 0.9 % 100 mL IVPB  Status:  Discontinued        2  g 200 mL/hr over 30 Minutes Intravenous Every 24 hours 07/14/21 0724 07/14/21 1323   07/14/21 1000  metroNIDAZOLE (FLAGYL) IVPB 500 mg  Status:  Discontinued        500 mg 100 mL/hr over 60 Minutes Intravenous Every 12 hours 07/14/21 0724 07/14/21 1323   07/13/21 2145  piperacillin-tazobactam (ZOSYN) IVPB 3.375 g        3.375 g 100 mL/hr over 30 Minutes Intravenous  Once 07/13/21 2130 07/13/21 2300   07/13/21 2145  cefTRIAXone (ROCEPHIN) 1 g in sodium chloride 0.9 % 100 mL IVPB        1 g 200 mL/hr over 30 Minutes Intravenous  Once 07/13/21 2130 07/13/21 2227              Family Communication/Anticipated D/C date and plan/Code Status   DVT prophylaxis:      Code Status: Full Code  Family Communication: Plan discussed with his wife at the bedside Disposition Plan:  Plan to discharge home in 1 to 2 days   Status is: Inpatient Remains inpatient appropriate because: IV antibiotics       Subjective:   No fever or chills overnight.  No abdominal pain or vomiting.  Objective:    Vitals:   07/19/21 1528 07/19/21 1941 07/20/21 0602 07/20/21 0751  BP: (!) 120/41 (!) 136/53 (!) 132/58 (!) 150/58  Pulse: 65 71 76 65  Resp: '18 18 18 16  '$ Temp: 98.3 F (36.8 C) 99.4 F (37.4 C) 98.6 F (37 C) 98 F (36.7 C)  TempSrc: Oral Oral Oral Oral  SpO2: 93% 94% 94% 95%  Weight:      Height:       No data found.   Intake/Output Summary (Last 24 hours) at 07/20/2021 1257 Last data filed at 07/20/2021 3790 Gross per 24 hour  Intake 480 ml  Output 0 ml  Net 480 ml   Filed Weights   07/13/21 1956 07/13/21 2354  Weight: 104.8 kg 104.9 kg    Exam:  GEN: NAD SKIN: No rash EYES: EOMI ENT: MMM CV: RRR PULM: CTA B ABD: soft, ND, NT, +BS CNS: AAO x 3, non focal EXT: Mild b/l leg edema, no tenderness      Data Reviewed:   I have personally reviewed following labs and imaging studies:  Labs: Labs show the following:   Basic Metabolic Panel: Recent Labs  Lab 07/13/21 2007 07/14/21 0717 07/16/21 1540 07/20/21 0605  NA 135 138 136  --   K 3.8 3.5 3.9  --   CL 104 106 103  --   CO2 '23 27 28  '$ --   GLUCOSE 208* 119* 107*  --   BUN '17 14 14  '$ --   CREATININE 0.74 0.59* 0.69 0.49*  CALCIUM 8.9 8.8* 8.6*  --    GFR Estimated Creatinine Clearance: 83.5 mL/min (A) (by C-G formula based on SCr of 0.49 mg/dL (L)). Liver Function Tests: Recent Labs  Lab 07/13/21 2007 07/14/21 0717 07/16/21 1540 07/18/21 1034  AST 65* 47* 52* 49*  ALT 127* 100* 72* 61*  ALKPHOS 218* 173* 142* 132*  BILITOT 1.4* 0.8 0.6 1.0  PROT 7.0 6.1* 6.2* 6.4*  ALBUMIN 3.3* 2.9* 3.0* 2.9*   No results for input(s): "LIPASE", "AMYLASE" in the last 168 hours.  No results for input(s): "AMMONIA" in the last 168 hours. Coagulation profile No results for  input(s): "INR", "PROTIME" in the last 168 hours.  CBC: Recent Labs  Lab 07/13/21 2007 07/14/21 2409 07/15/21 0503 07/16/21  1540 07/18/21 1034  WBC 11.2* 8.8 11.2* 7.3 9.0  NEUTROABS 8.4*  --   --  4.0 5.9  HGB 12.7* 11.4* 11.6* 11.2* 11.6*  HCT 35.3* 33.1* 33.7* 33.1* 34.9*  MCV 94.6 93.8 95.5 95.9 96.7  PLT 246 217 229 242 306   Cardiac Enzymes: No results for input(s): "CKTOTAL", "CKMB", "CKMBINDEX", "TROPONINI" in the last 168 hours. BNP (last 3 results) No results for input(s): "PROBNP" in the last 8760 hours. CBG: No results for input(s): "GLUCAP" in the last 168 hours. D-Dimer: No results for input(s): "DDIMER" in the last 72 hours. Hgb A1c: No results for input(s): "HGBA1C" in the last 72 hours. Lipid Profile: No results for input(s): "CHOL", "HDL", "LDLCALC", "TRIG", "CHOLHDL", "LDLDIRECT" in the last 72 hours. Thyroid function studies: Recent Labs    07/18/21 1034  TSH 0.592   Anemia work up: No results for input(s): "VITAMINB12", "FOLATE", "FERRITIN", "TIBC", "IRON", "RETICCTPCT" in the last 72 hours. Sepsis Labs: Recent Labs  Lab 07/13/21 2007 07/14/21 0717 07/15/21 0503 07/16/21 1540 07/18/21 1034  PROCALCITON  --   --   --  0.14 <0.10  WBC 11.2* 8.8 11.2* 7.3 9.0  LATICACIDVEN 1.7  --   --   --   --     Microbiology Recent Results (from the past 240 hour(s))  Culture, blood (Routine X 2) w Reflex to ID Panel     Status: None   Collection Time: 07/11/21  3:51 PM   Specimen: BLOOD  Result Value Ref Range Status   Specimen Description BLOOD BLOOD LEFT HAND  Final   Special Requests   Final    BOTTLES DRAWN AEROBIC AND ANAEROBIC Blood Culture adequate volume   Culture   Final    NO GROWTH 5 DAYS Performed at [facility], [address] [place], [place] [address]    Report Status 07/16/2021 FINAL  Final  Culture, blood (Routine X 2) w Reflex to ID Panel     Status: None   Collection Time: 07/11/21  4:04 PM   Specimen: BLOOD   Result Value Ref Range Status   Specimen Description BLOOD BLOOD RIGHT HAND  Final   Special Requests   Final    BOTTLES DRAWN AEROBIC AND ANAEROBIC Blood Culture adequate volume   Culture   Final    NO GROWTH 5 DAYS Performed at [facility] [address], [place], [place] [address]    Report Status 07/16/2021 FINAL  Final  Blood culture (routine x 2)     Status: None   Collection Time: 07/13/21  8:07 PM   Specimen: BLOOD  Result Value Ref Range Status   Specimen Description BLOOD RIGHT HAND  Final   Special Requests   Final    BOTTLES DRAWN AEROBIC AND ANAEROBIC Blood Culture adequate volume   Culture   Final    NO GROWTH 5 DAYS Performed at [facility], [address] [place], [place] [address]    Report Status 07/18/2021 FINAL  Final  Blood culture (routine x 2)     Status: None   Collection Time: 07/13/21  9:33 PM   Specimen: BLOOD  Result Value Ref Range Status   Specimen Description BLOOD RIGHT FA  Final   Special Requests   Final    BOTTLES DRAWN AEROBIC AND ANAEROBIC Blood Culture adequate volume   Culture   Final    NO GROWTH 5 DAYS Performed at [facility], [address] [place],  [address]  Report Status 07/18/2021 FINAL  Final  Culture, blood (Routine X 2) w Reflex to ID Panel     Status: None (Preliminary result)   Collection Time: 07/17/21 10:21 AM   Specimen: BLOOD LEFT HAND  Result Value Ref Range Status   Specimen Description BLOOD LEFT HAND  Final   Special Requests   Final    BOTTLES DRAWN AEROBIC AND ANAEROBIC Blood Culture results may not be optimal due to an excessive volume of blood received in culture bottles   Culture   Final    NO GROWTH 3 DAYS Performed at [facility], [address], [place], [place] [address]    Report Status PENDING  Incomplete  Culture, blood (Routine X 2) w Reflex to ID Panel     Status: None (Preliminary result)   Collection Time: 07/17/21 10:21 AM   Specimen:  BLOOD LEFT ARM  Result Value Ref Range Status   Specimen Description BLOOD LEFT ARM  Final   Special Requests   Final    BOTTLES DRAWN AEROBIC AND ANAEROBIC Blood Culture results may not be optimal due to an excessive volume of blood received in culture bottles   Culture   Final    NO GROWTH 3 DAYS Performed at [facility], [address] [place], [place] [address]    Report Status PENDING  Incomplete    Procedures and diagnostic studies:  US Venous Img Lower Bilateral (DVT)  Result Date: 07/18/2021 CLINICAL DATA:  Bilateral lower extremity swelling EXAM: BILATERAL LOWER EXTREMITY VENOUS DOPPLER ULTRASOUND TECHNIQUE: Gray-scale sonography with graded compression, as well as color Doppler and duplex ultrasound were performed to evaluate the lower extremity deep venous systems from the level of the common femoral vein and including the common femoral, femoral, profunda femoral, popliteal and calf veins including the posterior tibial, peroneal and gastrocnemius veins when visible. Spectral Doppler was utilized to evaluate flow at rest and with distal augmentation maneuvers in the common femoral, femoral and popliteal veins. COMPARISON:  None Available. FINDINGS: RIGHT LOWER EXTREMITY Common Femoral Vein: No evidence of thrombus. Normal compressibility, respiratory phasicity and response to augmentation. Saphenofemoral Junction: No evidence of thrombus. Normal compressibility and flow on color Doppler imaging. Profunda Femoral Vein: No evidence of thrombus. Normal compressibility and flow on color Doppler imaging. Femoral Vein: No evidence of thrombus. Normal compressibility, respiratory phasicity and response to augmentation. Popliteal Vein: No evidence of thrombus. Normal compressibility, respiratory phasicity and response to augmentation. Calf Veins: No evidence of thrombus. Normal compressibility and flow on color Doppler imaging. Other Findings: Right popliteal fossa Baker's cyst measures  1.6 x 2.3 x 0.8 cm LEFT LOWER EXTREMITY Common Femoral Vein: No evidence of thrombus. Normal compressibility, respiratory phasicity and response to augmentation. Saphenofemoral Junction: No evidence of thrombus. Normal compressibility and flow on color Doppler imaging. Profunda Femoral Vein: No evidence of thrombus. Normal compressibility and flow on color Doppler imaging. Femoral Vein: No evidence of thrombus. Normal compressibility, respiratory phasicity and response to augmentation. Popliteal Vein: No evidence of thrombus. Normal compressibility, respiratory phasicity and response to augmentation. Calf Veins: No evidence of thrombus. Normal compressibility and flow on color Doppler imaging. IMPRESSION: No evidence of deep venous thrombosis in either lower extremity. Electronically Signed   By: Jerilynn Mages.  Shick M.D.   On: 07/18/2021 16:23               LOS: 7 days   Brittanyann Wittner  [facility]   Pager on [email or web address]. If 7PM-7AM, please contact night-coverage at [email or web address]  07/20/2021, 12:57 PM

## 2021-07-21 ENCOUNTER — Other Ambulatory Visit: Payer: Self-pay | Admitting: Infectious Diseases

## 2021-07-21 ENCOUNTER — Inpatient Hospital Stay: Payer: Self-pay

## 2021-07-21 ENCOUNTER — Inpatient Hospital Stay: Payer: Medicare Other

## 2021-07-21 DIAGNOSIS — R509 Fever, unspecified: Secondary | ICD-10-CM | POA: Diagnosis not present

## 2021-07-21 DIAGNOSIS — K750 Abscess of liver: Secondary | ICD-10-CM | POA: Diagnosis not present

## 2021-07-21 DIAGNOSIS — K8309 Other cholangitis: Secondary | ICD-10-CM | POA: Diagnosis not present

## 2021-07-21 LAB — EPSTEIN BARR VRS(EBV DNA BY PCR): EBV DNA QN by PCR: POSITIVE IU/mL

## 2021-07-21 MED ORDER — SODIUM CHLORIDE 0.9% FLUSH: 10.0000 mL | INTRAVENOUS | Status: DC | PRN

## 2021-07-21 MED ORDER — SODIUM CHLORIDE 0.9% FLUSH
10.0000 mL | Freq: Two times a day (BID) | INTRAVENOUS | Status: DC
  Administered 2021-07-21 – 2021-07-22 (×2): 10 mL

## 2021-07-21 MED ORDER — IOHEXOL 300 MG/ML  SOLN
100.0000 mL | Freq: Once | INTRAMUSCULAR | Status: AC | PRN
  Administered 2021-07-21: 100 mL via INTRAVENOUS

## 2021-07-21 MED ORDER — CHLORHEXIDINE GLUCONATE CLOTH 2 % EX PADS
6.0000 | MEDICATED_PAD | Freq: Every day | CUTANEOUS | Status: DC
  Administered 2021-07-21 – 2021-07-22 (×2): 6 via TOPICAL

## 2021-07-21 MED ORDER — IOHEXOL 9 MG/ML PO SOLN
500.0000 mL | ORAL | Status: AC
  Administered 2021-07-21 (×2): 500 mL via ORAL

## 2021-07-21 NOTE — Progress Notes (Signed)
PHARMACY CONSULT NOTE FOR:  OUTPATIENT  PARENTERAL ANTIBIOTIC THERAPY (OPAT)  Indication: Liver phlegmon and ascending cholangitis Regimen: meropenem 1gm IV q8h End date: 08/07/2021  Please pull PIC at completion of IV antibiotics Fax weekly labs to Dr.[phone]  IV antibiotic discharge orders are pended. To discharging provider:  please sign these orders via discharge navigator,  Select New Orders & click on the button choice - Manage This Unsigned Work.     Thank you for allowing pharmacy to be a part of this patient's care.  Doreene Eland, PharmD, BCPS, BCIDP Work Cell: [phone] 07/21/2021 4:40 PM

## 2021-07-21 NOTE — Progress Notes (Signed)
Progress Note    Jacob Montes  DEY:[id number] DOB: 1940-11-11  DOA: 07/13/2021 PCP: Jerrol Banana., MD      Brief Narrative:    Medical records reviewed and are as summarized below:  Jacob Montes is a 81 y.o. male with medical history significant for CAD, aortic stenosis s/p TAVR, hypertension, hyperlipidemia, BPH, CAD, obesity, recent discharge from the hospital on 07/12/2021 after hospitalization on 07/08/2021 for acute cholangitis with choledocholithiasis s/p ERCP.  He presented to the hospital with fever despite being on oral antibiotics since discharge from the hospital. CT abdomen and pelvis was concerning for liver abscesses.     Assessment/Plan:   Principal Problem:   Fever Active Problems:   Elevated liver enzymes   Liver abscess   History of CAD   Morbid obesity (HCC)   Benign essential hypertension   Ascending cholangitis   Hyperlipidemia   CKD (chronic kidney disease)    Body mass index is 37.33 kg/m.  (Obesity)  Recurrent fever, ? liver abscesses: Repeat CT abdomen and pelvis showed multiple areas of inflammation in the liver which may represent phlegmon per radiologist. No growth on blood cultures thus far. No vegetations on 2D echo. Lower extremity venous duplex did not show any DVT. Plan to start patient on IV meropenem via PICC line.  Case discussed with Dr. Steva Ready, [place] specialist, via secure chat.   Recent acute cholangitis, choledocholithiasis: S/p ERCP on 07/10/2021 with stone extraction.  Liver enzymes are better.  Appreciate input from GI.  GI has signed off.  Elevated liver enzymes: Improved.  Liver cirrhosis with portal hypertension: Signs of liver cirrhosis were noted on CT abdomen pelvis done on 07/21/2021.  Outpatient follow-up with a gastroenterologist.  Other comorbidities include hypertension, GERD   Diet Order             Diet Heart Room service appropriate? Yes; Fluid consistency: Thin  Diet effective now                             Consultants: Gastroenterologist, infectious disease  Procedures: None    Medications:    amLODipine  10 mg Oral Daily   enoxaparin (LOVENOX) injection  0.5 mg/kg Subcutaneous Q24H   metoprolol tartrate  12.5 mg Oral BID   multivitamin with minerals  1 tablet Oral Daily   pantoprazole  40 mg Oral Daily   Continuous Infusions:  meropenem (MERREM) IV 1 g (07/21/21 1506)     Anti-infectives (From admission, onward)    Start     Dose/Rate Route Frequency Ordered Stop   07/19/21 1400  meropenem (MERREM) 1 g in sodium chloride 0.9 % 100 mL IVPB        1 g 200 mL/hr over 30 Minutes Intravenous Every 8 hours 07/19/21 1031     07/17/21 1400  meropenem (MERREM) 1 g in sodium chloride 0.9 % 100 mL IVPB  Status:  Discontinued        1 g 200 mL/hr over 30 Minutes Intravenous Every 8 hours 07/17/21 1055 07/19/21 0936   07/14/21 2200  Ampicillin-Sulbactam (UNASYN) 3 g in sodium chloride 0.9 % 100 mL IVPB  Status:  Discontinued        3 g 200 mL/hr over 30 Minutes Intravenous Every 6 hours 07/14/21 1331 07/14/21 1456   07/14/21 1500  piperacillin-tazobactam (ZOSYN) IVPB 3.375 g  Status:  Discontinued        3.375 g 12.5 mL/hr over  240 Minutes Intravenous Every 8 hours 07/14/21 1456 07/17/21 1055   07/14/21 1200  cefTRIAXone (ROCEPHIN) 2 g in sodium chloride 0.9 % 100 mL IVPB  Status:  Discontinued        2 g 200 mL/hr over 30 Minutes Intravenous Every 24 hours 07/14/21 0724 07/14/21 1323   07/14/21 1000  metroNIDAZOLE (FLAGYL) IVPB 500 mg  Status:  Discontinued        500 mg 100 mL/hr over 60 Minutes Intravenous Every 12 hours 07/14/21 0724 07/14/21 1323   07/13/21 2145  piperacillin-tazobactam (ZOSYN) IVPB 3.375 g        3.375 g 100 mL/hr over 30 Minutes Intravenous  Once 07/13/21 2130 07/13/21 2300   07/13/21 2145  cefTRIAXone (ROCEPHIN) 1 g in sodium chloride 0.9 % 100 mL IVPB        1 g 200 mL/hr over 30 Minutes Intravenous  Once 07/13/21 2130 07/13/21  2227              Family Communication/Anticipated D/C date and plan/Code Status   DVT prophylaxis:      Code Status: Full Code  Family Communication: Plan discussed with his wife at the bedside Disposition Plan: Plan to discharge home in 1 to 2 days   Status is: Inpatient Remains inpatient appropriate because: IV antibiotics       Subjective:   No fever or chills overnight.  No abdominal pain, nausea or vomiting.  Objective:    Vitals:   07/20/21 1514 07/20/21 1921 07/21/21 0614 07/21/21 0827  BP: (!) 141/54 (!) 134/59 (!) 134/48 (!) 142/53  Pulse: 69 68 65 63  Resp: '16 17 20 18  '$ Temp: 98 F (36.7 C) 98.9 F (37.2 C) 99 F (37.2 C) 98.7 F (37.1 C)  TempSrc: Oral Oral  Oral  SpO2: 90% 91% 93% 93%  Weight:      Height:       No data found.   Intake/Output Summary (Last 24 hours) at 07/21/2021 1623 Last data filed at 07/21/2021 1017 Gross per 24 hour  Intake 240 ml  Output --  Net 240 ml   Filed Weights   07/13/21 1956 07/13/21 2354  Weight: 104.8 kg 104.9 kg    Exam:  GEN: NAD SKIN: No rash EYES: EOMI ENT: MMM CV: RRR PULM: CTA B ABD: soft, ND, NT, +BS CNS: AAO x 3, non focal EXT: No edema or tenderness      Data Reviewed:   I have personally reviewed following labs and imaging studies:  Labs: Labs show the following:   Basic Metabolic Panel: Recent Labs  Lab 07/16/21 1540 07/20/21 0605  NA 136  --   K 3.9  --   CL 103  --   CO2 28  --   GLUCOSE 107*  --   BUN 14  --   CREATININE 0.69 0.49*  CALCIUM 8.6*  --    GFR Estimated Creatinine Clearance: 83.5 mL/min (A) (by C-G formula based on SCr of 0.49 mg/dL (L)). Liver Function Tests: Recent Labs  Lab 07/16/21 1540 07/18/21 1034  AST 52* 49*  ALT 72* 61*  ALKPHOS 142* 132*  BILITOT 0.6 1.0  PROT 6.2* 6.4*  ALBUMIN 3.0* 2.9*   No results for input(s): "LIPASE", "AMYLASE" in the last 168 hours.  No results for input(s): "AMMONIA" in the last 168  hours. Coagulation profile No results for input(s): "INR", "PROTIME" in the last 168 hours.  CBC: Recent Labs  Lab 07/15/21 0503 07/16/21 1540 07/18/21 1034  WBC 11.2* 7.3 9.0  NEUTROABS  --  4.0 5.9  HGB 11.6* 11.2* 11.6*  HCT 33.7* 33.1* 34.9*  MCV 95.5 95.9 96.7  PLT 229 242 306   Cardiac Enzymes: No results for input(s): "CKTOTAL", "CKMB", "CKMBINDEX", "TROPONINI" in the last 168 hours. BNP (last 3 results) No results for input(s): "PROBNP" in the last 8760 hours. CBG: No results for input(s): "GLUCAP" in the last 168 hours. D-Dimer: No results for input(s): "DDIMER" in the last 72 hours. Hgb A1c: No results for input(s): "HGBA1C" in the last 72 hours. Lipid Profile: No results for input(s): "CHOL", "HDL", "LDLCALC", "TRIG", "CHOLHDL", "LDLDIRECT" in the last 72 hours. Thyroid function studies: No results for input(s): "TSH", "T4TOTAL", "T3FREE", "THYROIDAB" in the last 72 hours.  Invalid input(s): "FREET3"  Anemia work up: No results for input(s): "VITAMINB12", "FOLATE", "FERRITIN", "TIBC", "IRON", "RETICCTPCT" in the last 72 hours. Sepsis Labs: Recent Labs  Lab 07/15/21 0503 07/16/21 1540 07/18/21 1034  PROCALCITON  --  0.14 <0.10  WBC 11.2* 7.3 9.0    Microbiology Recent Results (from the past 240 hour(s))  Blood culture (routine x 2)     Status: None   Collection Time: 07/13/21  8:07 PM   Specimen: BLOOD  Result Value Ref Range Status   Specimen Description BLOOD RIGHT HAND  Final   Special Requests   Final    BOTTLES DRAWN AEROBIC AND ANAEROBIC Blood Culture adequate volume   Culture   Final    NO GROWTH 5 DAYS Performed at [facility], [address] [place], [place] [address]    Report Status 07/18/2021 FINAL  Final  Blood culture (routine x 2)     Status: None   Collection Time: 07/13/21  9:33 PM   Specimen: BLOOD  Result Value Ref Range Status   Specimen Description BLOOD RIGHT FA  Final   Special Requests   Final    BOTTLES  DRAWN AEROBIC AND ANAEROBIC Blood Culture adequate volume   Culture   Final    NO GROWTH 5 DAYS Performed at [facility], [address] [place], [place] [address]    Report Status 07/18/2021 FINAL  Final  Culture, blood (Routine X 2) w Reflex to ID Panel     Status: None (Preliminary result)   Collection Time: 07/17/21 10:21 AM   Specimen: BLOOD LEFT HAND  Result Value Ref Range Status   Specimen Description BLOOD LEFT HAND  Final   Special Requests   Final    BOTTLES DRAWN AEROBIC AND ANAEROBIC Blood Culture results may not be optimal due to an excessive volume of blood received in culture bottles   Culture   Final    NO GROWTH 4 DAYS Performed at [facility], [address], [place], [place] [address]    Report Status PENDING  Incomplete  Culture, blood (Routine X 2) w Reflex to ID Panel     Status: None (Preliminary result)   Collection Time: 07/17/21 10:21 AM   Specimen: BLOOD LEFT ARM  Result Value Ref Range Status   Specimen Description BLOOD LEFT ARM  Final   Special Requests   Final    BOTTLES DRAWN AEROBIC AND ANAEROBIC Blood Culture results may not be optimal due to an excessive volume of blood received in culture bottles   Culture   Final    NO GROWTH 4 DAYS Performed at [facility], [address], [place], [place] [address]    Report Status PENDING  Incomplete  Procedures and diagnostic studies:  CT ABDOMEN PELVIS W CONTRAST  Result Date: 07/21/2021 CLINICAL DATA:  Intra-abdominal abscess/recurrent fevers in a 81 year old male with recent diagnosis of hepatic abnormalities. EXAM: CT ABDOMEN AND PELVIS WITH CONTRAST TECHNIQUE: Multidetector CT imaging of the abdomen and pelvis was performed using the standard protocol following bolus administration of intravenous contrast. RADIATION DOSE REDUCTION: This exam was performed according to the departmental dose-optimization program which includes automated exposure control, adjustment of  the mA and/or kV according to patient size and/or use of iterative reconstruction technique. CONTRAST:  122m OMNIPAQUE IOHEXOL 300 MG/ML  SOLN COMPARISON:  July 14, 2021. FINDINGS: Lower chest: Basilar atelectasis. No effusion or consolidation. Signs of trans arterial aortic valve replacement. Hepatobiliary: Small locules of gas are present in the LEFT hemiliver amidst the area of low attenuation that was discussed previously, now perhaps better defined measuring approximately 4.2 x 4.0 cm with small locules of gas along the margin. Other areas that were seen on previous MR imaging are not appreciated on the current study. This focal area in the lateral segment of the LEFT hepatic lobe was not appreciated as an abnormality on the MRI evaluation from Jul 08, 2021. The portal vein is grossly patent though somewhat diminutive. There are signs of portal venous hypertension with collateral pathways in the LEFT retroperitoneum and recanalized umbilical vein. No frank abscess is identified. Pancreas: Pancreatic atrophy without signs of inflammation or ductal dilation. Spleen: Calcification along the margin of the spleen which is top-normal size. No focal splenic lesion. Adrenals/Urinary Tract: Adrenal glands are normal. Smooth renal contours without hydronephrosis. No perivesical stranding. No suspicious renal lesion. No perinephric stranding. Stomach/Bowel: Normal appendix. Colonic diverticulosis and diverticular disease mainly in the sigmoid colon. Vascular/Lymphatic: Aortic atherosclerosis. No sign of aneurysm. Smooth contour of the IVC. There is no gastrohepatic or hepatoduodenal ligament lymphadenopathy. No retroperitoneal or mesenteric lymphadenopathy. No pelvic sidewall lymphadenopathy. Reproductive: Unremarkable to the extent evaluated on CT. Other: No ascites. Musculoskeletal: Separation of the inferior margin of the sternotomy without change. No acute musculoskeletal process or destructive bone finding.  IMPRESSION: 1. Area of low attenuation in the LEFT hepatic lobe is better defined than on previous imaging in the lateral segment of the LEFT hepatic lobe which shows no frank evidence of fluid density. Other areas may be improved but are not as well evaluated on CT. Findings may represent phlegmon and should be followed closely to ensure no developing abscess. 2. Small locules of gas adjacent to this area are favored to be related to recent ERCP and sphincterotomy. 3. Close follow-up is suggested. Would correlate with any continued symptoms of cholangitis. 4. Signs of liver disease/cirrhosis and portal hypertension 5. Colonic diverticulosis and diverticular disease mainly in the sigmoid colon. 6. Aortic atherosclerosis. Aortic Atherosclerosis (ICD10-I70.0). Electronically Signed   By: GZetta BillsM.D.   On: 07/21/2021 15:24               LOS: 8 days   Karlyn Glasco  [facility]   Pager on [email or web address] If 7PM-7AM, please contact night-coverage at [email or web address]     07/21/2021, 4:23 PM

## 2021-07-21 NOTE — Plan of Care (Signed)
Pt AAOx4, no pain. VS are stable. Plan for mobility, IV abx and CT today. Bed in lowest position. Call light within reach. Will continue to monitor.

## 2021-07-21 NOTE — Progress Notes (Signed)
Peripherally Inserted Central Catheter Placement  The IV Nurse has discussed with the patient and/or persons authorized to consent for the patient, the purpose of this procedure and the potential benefits and risks involved with this procedure.  The benefits include less needle sticks, lab draws from the catheter, and the patient may be discharged home with the catheter. Risks include, but not limited to, infection, bleeding, blood clot (thrombus formation), and puncture of an artery; nerve damage and irregular heartbeat and possibility to perform a PICC exchange if needed/ordered by physician.  Alternatives to this procedure were also discussed.  Bard Power PICC patient education guide, fact sheet on infection prevention and patient information card has been provided to patient /or left at bedside.    PICC Placement Documentation  PICC Single Lumen 07/21/21 Right Brachial 40 cm 0 cm (Active)  Indication for Insertion or Continuance of Line Home intravenous therapies (PICC only) 07/21/21 1826  Exposed Catheter (cm) 0 cm 07/21/21 1826  Site Assessment Clean, Dry, Intact 07/21/21 1826  Line Status Flushed;Saline locked;Blood return noted 07/21/21 1826  Dressing Type Transparent;Securing device 07/21/21 1826  Dressing Status Antimicrobial disc in place;Clean, Dry, Intact 07/21/21 1826  Safety Lock Not Applicable 42/87/68 1157  Line Adjustment (NICU/IV Team Only) No 07/21/21 1826  Dressing Intervention New dressing;Other (Comment) 07/21/21 1826  Dressing Change Due 07/28/21 07/21/21 1826       Jacob Montes 07/21/2021, 6:29 PM

## 2021-07-21 NOTE — Progress Notes (Signed)
   Date of Admission:  07/13/2021     ID: Jacob Montes is a 81 y.o. male  Principal Problem:   Fever Active Problems:   Benign essential hypertension   Ascending cholangitis   Hyperlipidemia   CKD (chronic kidney disease)   History of CAD   Morbid obesity (HCC)   Elevated liver enzymes   Liver abscess    Subjective: Pt doing well No fever > 48 hrs No pain abd   Medications:   amLODipine  10 mg Oral Daily   enoxaparin (LOVENOX) injection  0.5 mg/kg Subcutaneous Q24H   iohexol  500 mL Oral Q1H   metoprolol tartrate  12.5 mg Oral BID   multivitamin with minerals  1 tablet Oral Daily   pantoprazole  40 mg Oral Daily    Objective: Vital signs in last 24 hours: Patient Vitals for the past 24 hrs:  BP Temp Temp src Pulse Resp SpO2  07/21/21 0827 (!) 142/53 98.7 F (37.1 C) Oral 63 18 93 %  07/21/21 0614 (!) 134/48 99 F (37.2 C) -- 65 20 93 %  07/20/21 1921 (!) 134/59 98.9 F (37.2 C) Oral 68 17 91 %  07/20/21 1514 (!) 141/54 98 F (36.7 C) Oral 69 16 90 %       PHYSICAL EXAM:  General: Alert, cooperative, no distress, appears stated age.  Head: Normocephalic, without obvious abnormality, atraumatic. Eyes: Conjunctivae clear, anicteric sclerae. Pupils are equal ENT Nares normal. No drainage or sinus tenderness. Lips, mucosa, and tongue normal. No Thrush Neck: Supple, symmetrical, no adenopathy, thyroid: non tender no carotid bruit and no JVD. Back: No CVA tenderness. Lungs: Clear to auscultation bilaterally. No Wheezing or Rhonchi. No rales. Heart: Regular rate and rhythm, no murmur, rub or gallop. Abdomen: Soft, non-tender,not distended. Bowel sounds normal. No masses Extremities: atraumatic, no cyanosis. No edema. No clubbing Skin: No rashes or lesions. Or bruising Lymph: Cervical, supraclavicular normal. Neurologic: Grossly non-focal  Lab Results Recent Labs    07/20/21 0605  CREATININE 0.49*    Microbiology: 07/17/21- BC- Ng Studies/Results: 5/31  MRI   07/21/21   Left lobe liver 4.2 cm X 4 cm lesionwith locules of gas along the margin  Assessment/Plan: Ascending cholangitis secondary to CBD obstruction . S/p ERCP on 07/10/21 with biliary sphincterotomy and removal of sludge and stones Has multiple liver phlegmon- not evolved into a full abscess Repeat CT done today shows the left lobe lesion- Reviewed films with  radiologist- some of the lesions in the rt are much improved- left lobe lesion persist.  Pt on meropenem and fever has resolved. Will continue meropenem  for 3-4 weeks. Will repeat ultrasound  In 2-3 weeks  Cholecystectomy in jan 2022  CAD s/p CABG  S/p TAVR - 2 d echo no vegetation   Discussed the management with the patient and his wife and care team OPAT orders done Follow up as OP

## 2021-07-21 NOTE — Treatment Plan (Signed)
Diagnosis: Ascending chlonagitis Multiple liver phlegmon Baseline Creatinine <1    Allergies  Allergen Reactions   Atorvastatin Other (See Comments)    aches   Ramipril Other (See Comments)    cough    OPAT Orders Discharge antibiotics: Meropenem 1 gram Iv every 8 hours Duration: 3 weeks End Date: 08/07/21  [facility] Care Per Protocol:  Labs weekly while on IV antibiotics: _X_ CBC with differential  _X_ CMP   _X_ Please pull PIC at completion of IV antibiotics   Fax weekly labs to Dr.Jackie [phone]  Clinic Follow Up Appt:08/05/21 at 9.15 Am   Call [phone] with any questions

## 2021-07-22 DIAGNOSIS — M79675 Pain in left toe(s): Secondary | ICD-10-CM | POA: Diagnosis not present

## 2021-07-22 DIAGNOSIS — R509 Fever, unspecified: Secondary | ICD-10-CM | POA: Diagnosis not present

## 2021-07-22 DIAGNOSIS — B351 Tinea unguium: Secondary | ICD-10-CM | POA: Diagnosis not present

## 2021-07-22 DIAGNOSIS — M79674 Pain in right toe(s): Secondary | ICD-10-CM | POA: Diagnosis not present

## 2021-07-22 DIAGNOSIS — K750 Abscess of liver: Secondary | ICD-10-CM | POA: Diagnosis not present

## 2021-07-22 LAB — CMV DNA, QUANTITATIVE, PCR
CMV DNA Quant: NEGATIVE IU/mL
Log10 CMV Qn DNA Pl: UNDETERMINED log10 IU/mL

## 2021-07-22 LAB — CULTURE, BLOOD (ROUTINE X 2)
Culture: NO GROWTH
Culture: NO GROWTH

## 2021-07-22 MED ORDER — MEROPENEM IV (FOR PTA / DISCHARGE USE ONLY): 1.0000 g | Freq: Three times a day (TID) | INTRAVENOUS | 0 refills | Status: AC

## 2021-07-22 NOTE — TOC Transition Note (Signed)
Transition of Care [facility]) - CM/SW Discharge Note   Patient Details  Name: Jacob Montes MRN: [id number] Date of Birth: 04-25-1940  Transition of Care [facility]) CM/SW Contact:  Candie Chroman, LCSW Phone Number: 07/22/2021, 2:36 PM   Clinical Narrative:  Patient has orders to discharge home today. [facility] representative is aware. Left message for [facility] representative to notify. No further concerns. CSW signing off.  Final next level of care: Home w Home Health Services Barriers to Discharge: Barriers Resolved   Patient Goals and CMS Choice   CMS Medicare.gov Compare Post Acute Care list provided to:: Patient (Wife at bedside) Choice offered to / list presented to : Patient, Spouse  Discharge Placement                    Patient and family notified of of transfer: 07/22/21  Discharge Plan and Services     Post Acute Care Choice: [place] Arranged: RN, IV Antibiotics [place] Agency: [place] ([facility]), [facility] Date [facility] Agency Contacted: 07/22/21   Representative spoke with at [place]: [facility]: Floydene Flock, [place]: Carolynn Sayers  Social Determinants of Health (SDOH) Interventions     Readmission Risk Interventions     No data to display

## 2021-07-22 NOTE — Care Management Important Message (Signed)
Important Message  Patient Details  Name: Jacob Montes MRN: [id number] Date of Birth: October 30, 1940   Medicare Important Message Given:  Yes     Dannette Barbara 07/22/2021, 1:49 PM

## 2021-07-22 NOTE — TOC Progression Note (Signed)
Transition of Care [facility]) - Progression Note    Patient Details  Name: Jacob Montes MRN: [id number] Date of Birth: 1940-10-29  Transition of Care [facility]) CM/SW [place], LCSW Phone Number: 07/22/2021, 8:46 AM  Clinical Narrative:  Jeannene Patella with [facility] will be here around 9:30-10:00 for IV abx education with patient and family. They are ready for patient to discharge after mid-day dose. First home dose will be 10:00 pm. Sent secure chat to MD and RN to notify. Requested home health order for RN only.  Expected Discharge Plan: [place] Barriers to Discharge: Continued Medical Work up  Expected Discharge Plan and Services Expected Discharge Plan: [place] Choice: [place] arrangements for the past 2 months: Single Family Home                           HH Arranged: IV Antibiotics HH Agency: [facility] Date HH Agency Contacted: 07/15/21   Representative spoke with at [place]: [place] (SDOH) Interventions    Readmission Risk Interventions     No data to display

## 2021-07-22 NOTE — Plan of Care (Signed)
  Problem: Education: Goal: Knowledge of General Education information will improve Description Including pain rating scale, medication(s)/side effects and non-pharmacologic comfort measures Outcome: Progressing   

## 2021-07-22 NOTE — Discharge Summary (Signed)
Physician Discharge Summary   Patient: Jacob Montes MRN: [id number] DOB: November 25, 1940  Admit date:     07/13/2021  Discharge date: 07/22/2021  Discharge Physician: Jennye Boroughs   PCP: Jerrol Banana., MD   Recommendations at discharge:     Discharge Diagnoses: Principal Problem:   Fever Active Problems:   Elevated liver enzymes   Liver abscess   History of CAD   Morbid obesity ([place])   Benign essential hypertension   Ascending cholangitis   Hyperlipidemia   CKD (chronic kidney disease)  Resolved Problems:   * No resolved hospital problems. [facility]:  Jacob Montes is a 81 y.o. male with medical history significant for CAD, aortic stenosis s/p TAVR, hypertension, hyperlipidemia, BPH, CAD, obesity, recent discharge from the hospital on 07/12/2021 after hospitalization on 07/08/2021 for acute cholangitis with choledocholithiasis s/p ERCP.  He presented to the hospital with fever despite being on oral antibiotics since discharge from the hospital.  CT abdomen and pelvis showed multiple lesions that were concerning for liver abscesses.  He was treated with empiric IV antibiotics including IV ceftriaxone, IV Flagyl, IV Unasyn and IV Zosyn.  However, he continued to have recurrent fevers so he was switched to IV meropenem.  Repeat CT abdomen and pelvis showed improvement in the lesions that were initially noted in the liver.  The lesions were characterized as probable phlegmon.  He was afebrile for more than 48 hours and his condition has improved.  ID recommended that patient be discharged on IV meropenem through 08/07/2021.  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  This was discussed with the patient, his wife and son at the bedside.  Outpatient follow-up with gastroenterologist was recommended.         Consultants: Infectious disease specialist, gastroenterologist Procedures performed: PICC line in right  arm Disposition: Home health Diet recommendation:  Discharge Diet Orders (From admission, onward)     Start     Ordered   07/22/21 0000  Diet - low sodium heart healthy        07/22/21 1432           Cardiac diet DISCHARGE MEDICATION: Allergies as of 07/22/2021       Reactions   Atorvastatin Other (See Comments)   aches   Ramipril Other (See Comments)   cough        Medication List     STOP taking these medications    amoxicillin-clavulanate 875-125 MG tablet Commonly known as: AUGMENTIN       TAKE these medications    amLODipine 10 MG tablet Commonly known as: NORVASC Take 1 tablet by mouth once daily   aspirin 81 MG chewable tablet Chew 81 mg by mouth daily.   docusate sodium 250 MG capsule Commonly known as: COLACE Take 250 mg by mouth daily as needed for constipation.   furosemide 40 MG tablet Commonly known as: LASIX Take 1 tablet (40 mg total) by mouth 3 (three) times a week. What changed: additional instructions   meropenem  IVPB Commonly known as: MERREM Inject 1 g into the vein every 8 (eight) hours for 16 days. Indication:  Liver phlegmon and ascending cholangitis First Dose: Yes Last Day of Therapy:  08/07/2021 Labs - Once weekly:  CBC/D and CMP, Please pull PIC at completion of IV antibiotics Fax weekly labs to Dr.[phone] Method of administration: Mini-Bag Plus / Gravity Method of administration may be changed at  the discretion of home infusion pharmacist based upon assessment of the patient and/or caregiver's ability to self-administer the medication ordered.   metoprolol tartrate 25 MG tablet Commonly known as: LOPRESSOR Take 1 tablet (25 mg total) by mouth 2 (two) times daily.   montelukast 10 MG tablet Commonly known as: SINGULAIR Take 1 tablet (10 mg total) by mouth at bedtime as needed (allergies).   multivitamin tablet Take 1 tablet by mouth daily.   omeprazole 20 MG capsule Commonly known as:  PRILOSEC Take 20 mg by mouth daily. As needed   potassium chloride 10 MEQ tablet Commonly known as: KLOR-CON Take 1 tablet (10 mEq total) by mouth 3 (three) times a week. Take with lasix What changed: additional instructions   rosuvastatin 20 MG tablet Commonly known as: CRESTOR Take 1 tablet (20 mg total) by mouth daily.   tamsulosin 0.4 MG Caps capsule Commonly known as: FLOMAX Take 1 capsule by mouth once daily               Discharge Care Instructions  (From admission, onward)           Start     Ordered   07/22/21 0000  Change dressing on IV access line weekly and PRN  (Home infusion instructions - [facility] )        07/22/21 1432            Discharge Exam: Danley Danker Weights   07/13/21 1956 07/13/21 2354  Weight: 104.8 kg 104.9 kg   GEN: NAD SKIN: No rash EYES: EOMI ENT: MMM CV: RRR PULM: CTA B ABD: soft, ND, NT, +BS CNS: AAO x 3, non focal EXT: Mild b/l leg edema, no tenderness   Condition at discharge: good  The results of significant diagnostics from this hospitalization (including imaging, microbiology, ancillary and laboratory) are listed below for reference.   Imaging Studies: [place] EKG SITE RITE  Result Date: 07/21/2021 If Site Rite image not attached, placement could not be confirmed due to current cardiac rhythm.  CT ABDOMEN PELVIS W CONTRAST  Result Date: 07/21/2021 CLINICAL DATA:  Intra-abdominal abscess/recurrent fevers in a 81 year old male with recent diagnosis of hepatic abnormalities. EXAM: CT ABDOMEN AND PELVIS WITH CONTRAST TECHNIQUE: Multidetector CT imaging of the abdomen and pelvis was performed using the standard protocol following bolus administration of intravenous contrast. RADIATION DOSE REDUCTION: This exam was performed according to the departmental dose-optimization program which includes automated exposure control, adjustment of the mA and/or kV according to patient size and/or use of iterative reconstruction  technique. CONTRAST:  181m OMNIPAQUE IOHEXOL 300 MG/ML  SOLN COMPARISON:  July 14, 2021. FINDINGS: Lower chest: Basilar atelectasis. No effusion or consolidation. Signs of trans arterial aortic valve replacement. Hepatobiliary: Small locules of gas are present in the LEFT hemiliver amidst the area of low attenuation that was discussed previously, now perhaps better defined measuring approximately 4.2 x 4.0 cm with small locules of gas along the margin. Other areas that were seen on previous MR imaging are not appreciated on the current study. This focal area in the lateral segment of the LEFT hepatic lobe was not appreciated as an abnormality on the MRI evaluation from Jul 08, 2021. The portal vein is grossly patent though somewhat diminutive. There are signs of portal venous hypertension with collateral pathways in the LEFT retroperitoneum and recanalized umbilical vein. No frank abscess is identified. Pancreas: Pancreatic atrophy without signs of inflammation or ductal dilation. Spleen: Calcification along the margin of the spleen which is top-normal  size. No focal splenic lesion. Adrenals/Urinary Tract: Adrenal glands are normal. Smooth renal contours without hydronephrosis. No perivesical stranding. No suspicious renal lesion. No perinephric stranding. Stomach/Bowel: Normal appendix. Colonic diverticulosis and diverticular disease mainly in the sigmoid colon. Vascular/Lymphatic: Aortic atherosclerosis. No sign of aneurysm. Smooth contour of the IVC. There is no gastrohepatic or hepatoduodenal ligament lymphadenopathy. No retroperitoneal or mesenteric lymphadenopathy. No pelvic sidewall lymphadenopathy. Reproductive: Unremarkable to the extent evaluated on CT. Other: No ascites. Musculoskeletal: Separation of the inferior margin of the sternotomy without change. No acute musculoskeletal process or destructive bone finding. IMPRESSION: 1. Area of low attenuation in the LEFT hepatic lobe is better defined than on  previous imaging in the lateral segment of the LEFT hepatic lobe which shows no frank evidence of fluid density. Other areas may be improved but are not as well evaluated on CT. Findings may represent phlegmon and should be followed closely to ensure no developing abscess. 2. Small locules of gas adjacent to this area are favored to be related to recent ERCP and sphincterotomy. 3. Close follow-up is suggested. Would correlate with any continued symptoms of cholangitis. 4. Signs of liver disease/cirrhosis and portal hypertension 5. Colonic diverticulosis and diverticular disease mainly in the sigmoid colon. 6. Aortic atherosclerosis. Aortic Atherosclerosis (ICD10-I70.0). Electronically Signed   By: Zetta Bills M.D.   On: 07/21/2021 15:24   US Venous Img Lower Bilateral (DVT)  Result Date: 07/18/2021 CLINICAL DATA:  Bilateral lower extremity swelling EXAM: BILATERAL LOWER EXTREMITY VENOUS DOPPLER ULTRASOUND TECHNIQUE: Gray-scale sonography with graded compression, as well as color Doppler and duplex ultrasound were performed to evaluate the lower extremity deep venous systems from the level of the common femoral vein and including the common femoral, femoral, profunda femoral, popliteal and calf veins including the posterior tibial, peroneal and gastrocnemius veins when visible. Spectral Doppler was utilized to evaluate flow at rest and with distal augmentation maneuvers in the common femoral, femoral and popliteal veins. COMPARISON:  None Available. FINDINGS: RIGHT LOWER EXTREMITY Common Femoral Vein: No evidence of thrombus. Normal compressibility, respiratory phasicity and response to augmentation. Saphenofemoral Junction: No evidence of thrombus. Normal compressibility and flow on color Doppler imaging. Profunda Femoral Vein: No evidence of thrombus. Normal compressibility and flow on color Doppler imaging. Femoral Vein: No evidence of thrombus. Normal compressibility, respiratory phasicity and response to  augmentation. Popliteal Vein: No evidence of thrombus. Normal compressibility, respiratory phasicity and response to augmentation. Calf Veins: No evidence of thrombus. Normal compressibility and flow on color Doppler imaging. Other Findings: Right popliteal fossa Baker's cyst measures 1.6 x 2.3 x 0.8 cm LEFT LOWER EXTREMITY Common Femoral Vein: No evidence of thrombus. Normal compressibility, respiratory phasicity and response to augmentation. Saphenofemoral Junction: No evidence of thrombus. Normal compressibility and flow on color Doppler imaging. Profunda Femoral Vein: No evidence of thrombus. Normal compressibility and flow on color Doppler imaging. Femoral Vein: No evidence of thrombus. Normal compressibility, respiratory phasicity and response to augmentation. Popliteal Vein: No evidence of thrombus. Normal compressibility, respiratory phasicity and response to augmentation. Calf Veins: No evidence of thrombus. Normal compressibility and flow on color Doppler imaging. IMPRESSION: No evidence of deep venous thrombosis in either lower extremity. Electronically Signed   By: Jerilynn Mages.  Shick M.D.   On: 07/18/2021 16:23   ECHOCARDIOGRAM COMPLETE  Result Date: 07/17/2021    ECHOCARDIOGRAM REPORT   Patient Name:   Jacob Montes Date of Exam: 07/17/2021 Medical Rec #:  [id number]   Height:       66.0 in Accession #:  [id number]  Weight:       231.3 lb Date of Birth:  Aug 19, 1940   BSA:          2.127 m Patient Age:    55 years    BP:           161/57 mmHg Patient Gender: M           HR:           69 bpm. Exam Location:  [facility] Procedure: 2D Echo, Cardiac Doppler and Color Doppler Indications:     R50.9 Fever  History:         Patient has prior history of Echocardiogram examinations, most                  recent 01/16/2021. CAD, Prior CABG, Stroke; Risk                  Factors:Hypertension, Dyslipidemia and Sleep Apnea. S/P TAVR                  03/2017.  Sonographer:     Charmayne Sheer Referring Phys:  [id number] Tsosie Billing  Diagnosing Phys: Ida Rogue MD  Sonographer Comments: Technically difficult study due to poor echo windows. Image acquisition challenging due to patient body habitus. IMPRESSIONS  1. No valve vegetation, though challenging images  2. Left ventricular ejection fraction, by estimation, is 60 to 65%. The left ventricle has normal function. The left ventricle has no regional wall motion abnormalities. Left ventricular diastolic parameters are consistent with Grade II diastolic dysfunction (pseudonormalization).  3. Right ventricular systolic function is normal. The right ventricular size is normal. Tricuspid regurgitation signal is inadequate for assessing PA pressure.  4. Left atrial size was moderately dilated.  5. The mitral valve is normal in structure. Mild to moderate mitral valve regurgitation. No evidence of mitral stenosis.  6. The aortic valve was not well visualized. Aortic valve regurgitation is not visualized. Moderate aortic valve stenosis. Aortic valve area, by VTI measures 0.99 cm. Aortic valve mean gradient measures 23.0 mmHg. Aortic valve Vmax measures 3.32 m/s.  7. The inferior vena cava is normal in size with greater than 50% respiratory variability, suggesting right atrial pressure of 3 mmHg. FINDINGS  Left Ventricle: Left ventricular ejection fraction, by estimation, is 60 to 65%. The left ventricle has normal function. The left ventricle has no regional wall motion abnormalities. The left ventricular internal cavity size was normal in size. There is  no left ventricular hypertrophy. Left ventricular diastolic parameters are consistent with Grade II diastolic dysfunction (pseudonormalization). Right Ventricle: The right ventricular size is normal. No increase in right ventricular wall thickness. Right ventricular systolic function is normal. Tricuspid regurgitation signal is inadequate for assessing PA pressure. Left Atrium: Left atrial size was moderately dilated. Right Atrium: Right atrial  size was normal in size. Pericardium: There is no evidence of pericardial effusion. Mitral Valve: The mitral valve is normal in structure. Mild to moderate mitral valve regurgitation. No evidence of mitral valve stenosis. MV peak gradient, 8.9 mmHg. The mean mitral valve gradient is 3.0 mmHg. Tricuspid Valve: The tricuspid valve is normal in structure. Tricuspid valve regurgitation is not demonstrated. No evidence of tricuspid stenosis. Aortic Valve: The aortic valve was not well visualized. Aortic valve regurgitation is not visualized. Moderate aortic stenosis is present. Aortic valve mean gradient measures 23.0 mmHg. Aortic valve peak gradient measures 44.1 mmHg. Aortic valve area, by  VTI measures 0.99 cm. Pulmonic Valve: The pulmonic valve was  normal in structure. Pulmonic valve regurgitation is not visualized. No evidence of pulmonic stenosis. Aorta: The aortic root is normal in size and structure. Venous: The inferior vena cava is normal in size with greater than 50% respiratory variability, suggesting right atrial pressure of 3 mmHg. IAS/Shunts: No atrial level shunt detected by color flow Doppler.  LEFT VENTRICLE PLAX 2D LVIDd:         5.66 cm   Diastology LVIDs:         4.38 cm   LV e' medial:    5.00 cm/s LV PW:         1.04 cm   LV E/e' medial:  25.6 LV IVS:        0.99 cm   LV e' lateral:   7.72 cm/s LVOT diam:     1.90 cm   LV E/e' lateral: 16.6 LV SV:         71 LV SV Index:   33 LVOT Area:     2.84 cm  RIGHT VENTRICLE RV Basal diam:  3.64 cm RV S prime:     13.50 cm/s LEFT ATRIUM             Index LA diam:        5.70 cm 2.68 cm/m LA Vol (A2C):   63.8 ml 29.99 ml/m LA Vol (A4C):   98.8 ml 46.45 ml/m LA Biplane Vol: 84.4 ml 39.68 ml/m  AORTIC VALVE                     PULMONIC VALVE AV Area (Vmax):    1.03 cm      PV Vmax:       1.33 m/s AV Area (Vmean):   1.04 cm      PV Vmean:      85.600 cm/s AV Area (VTI):     0.99 cm      PV VTI:        0.258 m AV Vmax:           332.00 cm/s   PV Peak  grad:  7.1 mmHg AV Vmean:          228.000 cm/s  PV Mean grad:  3.0 mmHg AV VTI:            0.722 m AV Peak Grad:      44.1 mmHg AV Mean Grad:      23.0 mmHg LVOT Vmax:         121.00 cm/s LVOT Vmean:        83.900 cm/s LVOT VTI:          0.251 m LVOT/AV VTI ratio: 0.35  AORTA Ao Root diam: 3.40 cm MITRAL VALVE MV Area (PHT): 3.10 cm     SHUNTS MV Area VTI:   1.65 cm     Systemic VTI:  0.25 m MV Peak grad:  8.9 mmHg     Systemic Diam: 1.90 cm MV Mean grad:  3.0 mmHg MV Vmax:       1.49 m/s MV Vmean:      85.5 cm/s MV Decel Time: 245 msec MV E velocity: 128.00 cm/s MV A velocity: 101.00 cm/s MV E/A ratio:  1.27 Ida Rogue MD Electronically signed by Ida Rogue MD Signature Date/Time: 07/17/2021/12:40:07 PM    Final    CT ABDOMEN PELVIS W CONTRAST  Result Date: 07/14/2021 CLINICAL DATA:  MD NOTE; son at bedside patient was discharged June 3 at home with diagnosis of acute cholangitis  and to finish up oral antibiotic as outpatient. He started having fever high-grade fever over the weekend. EXAM: CT ABDOMEN AND PELVIS WITH CONTRAST TECHNIQUE: Multidetector CT imaging of the abdomen and pelvis was performed using the standard protocol following bolus administration of intravenous contrast. RADIATION DOSE REDUCTION: This exam was performed according to the departmental dose-optimization program which includes automated exposure control, adjustment of the mA and/or kV according to patient size and/or use of iterative reconstruction technique. CONTRAST:  159m OMNIPAQUE IOHEXOL 300 MG/ML  SOLN COMPARISON:  MRI on 07/08/2021, ultrasound of the abdomen on 07/13/2021, CT of the abdomen and pelvis on 11/15/2020 FINDINGS: Lower chest: Lung bases are unremarkable. Partially imaged aortic valve replacement. Hepatobiliary: Prior cholecystectomy. Liver contour is normal. There is mildly heterogeneous appearance of the dome of the LEFT hepatic lobe, with numerous subtle low-attenuation areas measuring 5 millimeters or  smaller. Pancreas: Unremarkable. No pancreatic ductal dilatation or surrounding inflammatory changes. Spleen: Normal in size without focal abnormality. Adrenals/Urinary Tract: Adrenal glands are normal. Kidneys are symmetric in size and enhancement. Tiny low-attenuation lesion in the RIGHT UPPER pole region is consistent with small cyst based on Hounsfield unit criteria. Punctate intrarenal calculus identified in the UPPER pole of the LEFT kidney. The ureters are unremarkable without evidence for urinary tract obstruction. The bladder and visualized portion of the urethra are normal. Stomach/Bowel: Stomach and small bowel loops are normal in appearance. There are numerous colonic diverticula. No acute diverticulitis. The appendix is well seen and normal in appearance. Vascular/Lymphatic: There is atherosclerotic calcification of the abdominal aorta. No associated aneurysm. Portacaval lymph node is 1.1 centimeters on image 29 of series 2. RIGHT external iliac node is 0.8 centimeters on image 77 of series 2. Reproductive: Prostate is unremarkable. Other: Fat containing bilateral inguinal hernias noted. There is focal subcutaneous gas in the LEFT anterior abdominal wall, consistent with injection site. Musculoskeletal: There are degenerative changes primarily in the LOWER lumbar spine and at the lumbosacral junction. No lytic or blastic lesions. Previous median sternotomy. IMPRESSION: 1. Mildly heterogeneous appearance of the liver, with tiny, less than 5 millimeter lesions. The appearance is similar to findings on recent MRI and consistent with inflammatory changes. Small abscesses cannot be excluded. 2. Cholecystectomy. 3. Portacaval lymph node is 1.1 centimeters, likely reactive. 4. Possible nonobstructing intrarenal calculus in the UPPER pole of the LEFT kidney. 5. Colonic diverticulosis. 6.  Aortic atherosclerosis.  (ICD10-I70.0) 7. Bilateral fat containing inguinal hernias. Electronically Signed   By: ENolon NationsM.D.   On: 07/14/2021 17:14   [place] LIMITED RUQ (LIVER/GB)  Result Date: 07/13/2021 CLINICAL DATA:  Fever recent ERCP EXAM: ULTRASOUND ABDOMEN LIMITED RIGHT UPPER QUADRANT COMPARISON:  Ultrasound 05/05/2021, MRI 07/08/2021 FINDINGS: Gallbladder: Surgically absent Common bile duct: Diameter: 3.5 mm Liver: Echogenicity within normal limits. Linear echogenic areas within the right hepatic lobe with dirty shadowing suspected to represent air in the biliary tree. Portal vein is patent on color Doppler imaging with normal direction of blood flow towards the liver. Other: None. IMPRESSION: 1. Linear echogenic areas in the right hepatic lobe with features suggesting air in the biliary system potentially related to history of recent ERCP 2. Status post cholecystectomy Electronically Signed   By: KDonavan FoilM.D.   On: 07/13/2021 22:02   DG Chest 2 View  Result Date: 07/13/2021 CLINICAL DATA:  Fever. EXAM: CHEST - 2 VIEW COMPARISON:  Radiograph 04/22/2021 FINDINGS: Post median sternotomy and TAVR. Stable mild cardiomegaly. Unchanged mediastinal contours. No focal airspace disease or pleural  effusion. No pulmonary edema. No pneumothorax. Thoracic spondylosis. IMPRESSION: Stable mild cardiomegaly. No acute abnormality. Electronically Signed   By: Keith Rake M.D.   On: 07/13/2021 20:26   DG C-Arm 1-60 Min-No Report  Result Date: 07/10/2021 Fluoroscopy was utilized by the requesting physician.  No radiographic interpretation.   MR ABDOMEN MRCP W WO CONTAST  Result Date: 07/08/2021 CLINICAL DATA:  Fever since last Tuesday with upper abdominal pain and elevated bilirubin with negative ultrasound. EXAM: MRI ABDOMEN WITHOUT AND WITH CONTRAST (INCLUDING MRCP) TECHNIQUE: Multiplanar multisequence MR imaging of the abdomen was performed both before and after the administration of intravenous contrast. Heavily T2-weighted images of the biliary and pancreatic ducts were obtained, and three-dimensional MRCP  images were rendered by post processing. CONTRAST:  22m GADAVIST GADOBUTROL 1 MMOL/ML IV SOLN COMPARISON:  Ultrasound of Jul 05, 2021 FINDINGS: Lower chest: No effusion or consolidation at the lung bases. Hepatobiliary: No substantial biliary duct dilation but with filling defect in the distal common bile duct that may reflect a group of small stones or coalescent sludgew. (Image 52/14) up to 8 mm greatest craniocaudal extent and approximately 4 x 3 mm in the axial plane but again without substantial biliary duct dilation following cholecystectomy. Vague areas of T1 hypointensity and T2 hypointensity near the cephalad RIGHT and LEFT hemiliver with corresponding heterogeneity of enhancement, slightly more pronounced arterial phase enhancement. The portal vein is patent. Areas of heterogeneity show a persistent enhancement across multiple phases with perhaps mild fading pattern and without defined margins, largest area measuring approximately 3.8 x 1.9 cm (image 20/23) Vague areas also in the LEFT hepatic lobe are less numerous. Pancreas: Mild pancreatic atrophy. No ductal dilation or sign of inflammation. Intrinsic T1 signal with mild preservation. Spleen:  Normal size and contour without visible lesion. Adrenals/Urinary Tract: No suspicious renal lesion, hydronephrosis or perinephric stranding with normal appearance of the bilateral adrenal glands. Stomach/Bowel: No acute findings to the extent evaluated on this abdominal MRI. There is a hyperenhancing lesion associated with the greater curvature of the stomach near the fundus (image 24/25) 11 mm. Enhancement characteristics are similar to that of adjacent spleen and the lesion is relatively homogeneous. Assessment of previous imaging shows this is not changed since October of 2022 and in fact is stable dating back to October of 2020. Vascular/Lymphatic: Collateral pathways in the LEFT retroperitoneum are stable and not fully assessed. No adenopathy in the abdomen  or in the pelvis. Other:  No ascites. Musculoskeletal: Schmorl's nodes and vertebral hemangiomata not changed since prior CT imaging. IMPRESSION: 1. Vague areas of heterogeneous enhancement, ill-defined on all submitted sequences raising the question of inflammatory pseudolesions in the liver potentially related to underlying cholangitis in the setting of suspected coalescent sludge and or collection of small stones in the distal common bile duct. No focal fluid though areas could represent early hepatic abscess. No current frank evidence of obstruction. Given that these areas have a near masslike appearance would suggest delayed imaging in 6-8 weeks outside of the acute setting following therapy with MRI utilizing Eovist contrast to exclude worsening or underlying neoplasm. 2. 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 Heat damaged red blood cell study may be helpful for further evaluation. This represents a benign or indolent process based on behavior as it is not changed since October of 2020. Electronically Signed  By: Zetta Bills M.D.   On: 07/08/2021 14:04   MR 3D Recon At Scanner  Result Date: 07/08/2021 CLINICAL DATA:  Fever since last Tuesday with upper abdominal pain and elevated bilirubin with negative ultrasound. EXAM: MRI ABDOMEN WITHOUT AND WITH CONTRAST (INCLUDING MRCP) TECHNIQUE: Multiplanar multisequence MR imaging of the abdomen was performed both before and after the administration of intravenous contrast. Heavily T2-weighted images of the biliary and pancreatic ducts were obtained, and three-dimensional MRCP images were rendered by post processing. CONTRAST:  28m GADAVIST GADOBUTROL 1 MMOL/ML IV SOLN COMPARISON:  Ultrasound of Jul 05, 2021 FINDINGS: Lower chest: No effusion or consolidation at  the lung bases. Hepatobiliary: No substantial biliary duct dilation but with filling defect in the distal common bile duct that may reflect a group of small stones or coalescent sludgew. (Image 52/14) up to 8 mm greatest craniocaudal extent and approximately 4 x 3 mm in the axial plane but again without substantial biliary duct dilation following cholecystectomy. Vague areas of T1 hypointensity and T2 hypointensity near the cephalad RIGHT and LEFT hemiliver with corresponding heterogeneity of enhancement, slightly more pronounced arterial phase enhancement. The portal vein is patent. Areas of heterogeneity show a persistent enhancement across multiple phases with perhaps mild fading pattern and without defined margins, largest area measuring approximately 3.8 x 1.9 cm (image 20/23) Vague areas also in the LEFT hepatic lobe are less numerous. Pancreas: Mild pancreatic atrophy. No ductal dilation or sign of inflammation. Intrinsic T1 signal with mild preservation. Spleen:  Normal size and contour without visible lesion. Adrenals/Urinary Tract: No suspicious renal lesion, hydronephrosis or perinephric stranding with normal appearance of the bilateral adrenal glands. Stomach/Bowel: No acute findings to the extent evaluated on this abdominal MRI. There is a hyperenhancing lesion associated with the greater curvature of the stomach near the fundus (image 24/25) 11 mm. Enhancement characteristics are similar to that of adjacent spleen and the lesion is relatively homogeneous. Assessment of previous imaging shows this is not changed since October of 2022 and in fact is stable dating back to October of 2020. Vascular/Lymphatic: Collateral pathways in the LEFT retroperitoneum are stable and not fully assessed. No adenopathy in the abdomen or in the pelvis. Other:  No ascites. Musculoskeletal: Schmorl's nodes and vertebral hemangiomata not changed since prior CT imaging. IMPRESSION: 1. Vague areas of heterogeneous enhancement,  ill-defined on all submitted sequences raising the question of inflammatory pseudolesions in the liver potentially related to underlying cholangitis in the setting of suspected coalescent sludge and or collection of small stones in the distal common bile duct. No focal fluid though areas could represent early hepatic abscess. No current frank evidence of obstruction. Given that these areas have a near masslike appearance would suggest delayed imaging in 6-8 weeks outside of the acute setting following therapy with MRI utilizing Eovist contrast to exclude worsening or underlying neoplasm. 2. 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 Heat damaged red blood cell study may be helpful for further evaluation. This represents a benign or indolent process based on behavior as it is not changed since October of 2020. Electronically Signed   By: GZetta BillsM.D.   On: 07/08/2021 14:04   UKoreaAbdomen Limited RUQ (LIVER/GB)  Result Date: 07/05/2021 CLINICAL DATA:  Post prandial abdominal pain and bloating this week. History of a cholecystectomy. EXAM: ULTRASOUND ABDOMEN LIMITED RIGHT  UPPER QUADRANT COMPARISON:  CT abdomen pelvis, 11/15/2020. FINDINGS: Gallbladder: Surgically absent. Common bile duct: Diameter: 5 mm.  Not visualized distally. Liver: No focal lesion identified. Within normal limits in parenchymal echogenicity. Portal vein is patent on color Doppler imaging with normal direction of blood flow towards the liver. Other: None. IMPRESSION: 1. No acute findings.  No bile duct dilation. 2. Status post cholecystectomy. Electronically Signed   By: Lajean Manes M.D.   On: 07/05/2021 12:25   DG Abd 1 View  Result Date: 07/04/2021 CLINICAL DATA:  Abdominal pain and distention EXAM: ABDOMEN - 1 VIEW COMPARISON:  None  Available. FINDINGS: Bowel gas pattern is nonspecific. Small amount of stool is seen in the colon. There is no fecal impaction in the rectosigmoid. Surgical clips are seen in the right upper quadrant. No abnormal masses or calcifications are seen. Degenerative changes are noted in the lumbar spine. IMPRESSION: Nonspecific bowel gas pattern. Small stool burden in the colon without signs of fecal impaction in the rectosigmoid. Electronically Signed   By: Elmer Picker M.D.   On: 07/04/2021 20:37    Microbiology: Results for orders placed or performed during the hospital encounter of 07/13/21  Blood culture (routine x 2)     Status: None   Collection Time: 07/13/21  8:07 PM   Specimen: BLOOD  Result Value Ref Range Status   Specimen Description BLOOD RIGHT HAND  Final   Special Requests   Final    BOTTLES DRAWN AEROBIC AND ANAEROBIC Blood Culture adequate volume   Culture   Final    NO GROWTH 5 DAYS Performed at [facility], [address], [place], [place] [address]    Report Status 07/18/2021 FINAL  Final  Blood culture (routine x 2)     Status: None   Collection Time: 07/13/21  9:33 PM   Specimen: BLOOD  Result Value Ref Range Status   Specimen Description BLOOD RIGHT FA  Final   Special Requests   Final    BOTTLES DRAWN AEROBIC AND ANAEROBIC Blood Culture adequate volume   Culture   Final    NO GROWTH 5 DAYS Performed at [facility], [address] [place], [place] [address]    Report Status 07/18/2021 FINAL  Final  Culture, blood (Routine X 2) w Reflex to ID Panel     Status: None   Collection Time: 07/17/21 10:21 AM   Specimen: BLOOD LEFT HAND  Result Value Ref Range Status   Specimen Description BLOOD LEFT HAND  Final   Special Requests   Final    BOTTLES DRAWN AEROBIC AND ANAEROBIC Blood Culture results may not be optimal due to an excessive volume of blood received in culture bottles   Culture   Final    NO GROWTH 5 DAYS Performed at [facility], [address] [place], [place] [address]    Report Status 07/22/2021 FINAL  Final  Culture, blood (Routine X 2) w Reflex to ID Panel     Status: None   Collection Time: 07/17/21 10:21 AM   Specimen: BLOOD LEFT ARM  Result Value Ref Range Status   Specimen Description BLOOD LEFT ARM  Final   Special Requests   Final    BOTTLES DRAWN AEROBIC AND ANAEROBIC Blood Culture results may not be optimal due to an excessive volume of blood received in culture bottles   Culture   Final    NO GROWTH 5 DAYS Performed at [facility], [address],  [place], [place] [address]    Report Status 07/22/2021 FINAL  Final    Labs: CBC: Recent Labs  Lab 07/16/21 1540 07/18/21 1034  WBC 7.3 9.0  NEUTROABS 4.0 5.9  HGB 11.2* 11.6*  HCT 33.1* 34.9*  MCV 95.9 96.7  PLT 242 440   Basic Metabolic Panel: Recent Labs  Lab 07/16/21 1540 07/20/21 0605  NA 136  --   K 3.9  --   CL 103  --   CO2 28  --   GLUCOSE 107*  --   BUN 14  --   CREATININE 0.69 0.49*  CALCIUM 8.6*  --    Liver Function Tests: Recent Labs  Lab 07/16/21 1540 07/18/21 1034  AST 52* 49*  ALT 72* 61*  ALKPHOS 142* 132*  BILITOT 0.6 1.0  PROT 6.2* 6.4*  ALBUMIN 3.0* 2.9*   CBG: No results for input(s): "GLUCAP" in the last 168 hours.  Discharge time spent: greater than 30 minutes.  Signed: Jennye Boroughs, MD [facility] 07/22/2021

## 2021-07-23 DIAGNOSIS — N189 Chronic kidney disease, unspecified: Secondary | ICD-10-CM | POA: Diagnosis not present

## 2021-07-23 DIAGNOSIS — H269 Unspecified cataract: Secondary | ICD-10-CM | POA: Diagnosis not present

## 2021-07-23 DIAGNOSIS — I2510 Atherosclerotic heart disease of native coronary artery without angina pectoris: Secondary | ICD-10-CM | POA: Diagnosis not present

## 2021-07-23 DIAGNOSIS — K766 Portal hypertension: Secondary | ICD-10-CM | POA: Diagnosis not present

## 2021-07-23 DIAGNOSIS — G4730 Sleep apnea, unspecified: Secondary | ICD-10-CM | POA: Diagnosis not present

## 2021-07-23 DIAGNOSIS — K750 Abscess of liver: Secondary | ICD-10-CM | POA: Diagnosis not present

## 2021-07-23 DIAGNOSIS — K7460 Unspecified cirrhosis of liver: Secondary | ICD-10-CM | POA: Diagnosis not present

## 2021-07-23 DIAGNOSIS — I129 Hypertensive chronic kidney disease with stage 1 through stage 4 chronic kidney disease, or unspecified chronic kidney disease: Secondary | ICD-10-CM | POA: Diagnosis not present

## 2021-07-23 DIAGNOSIS — G9782 Other postprocedural complications and disorders of nervous system: Secondary | ICD-10-CM | POA: Diagnosis not present

## 2021-07-23 DIAGNOSIS — K8040 Calculus of bile duct with cholecystitis, unspecified, without obstruction: Secondary | ICD-10-CM | POA: Diagnosis not present

## 2021-07-23 DIAGNOSIS — K219 Gastro-esophageal reflux disease without esophagitis: Secondary | ICD-10-CM | POA: Diagnosis not present

## 2021-07-23 DIAGNOSIS — M159 Polyosteoarthritis, unspecified: Secondary | ICD-10-CM | POA: Diagnosis not present

## 2021-07-23 DIAGNOSIS — J3800 Paralysis of vocal cords and larynx, unspecified: Secondary | ICD-10-CM | POA: Diagnosis not present

## 2021-07-23 DIAGNOSIS — E785 Hyperlipidemia, unspecified: Secondary | ICD-10-CM | POA: Diagnosis not present

## 2021-07-23 DIAGNOSIS — Z452 Encounter for adjustment and management of vascular access device: Secondary | ICD-10-CM | POA: Diagnosis not present

## 2021-07-23 LAB — QUANTIFERON-TB GOLD PLUS (RQFGPL)
QuantiFERON Mitogen Value: >10 IU/mL
QuantiFERON Nil Value: 1.31 IU/mL
QuantiFERON TB1 Ag Value: 0.71 IU/mL
QuantiFERON TB2 Ag Value: 0.64 IU/mL

## 2021-07-23 LAB — QUANTIFERON-TB GOLD PLUS: QuantiFERON-TB Gold Plus: NEGATIVE

## 2021-07-25 ENCOUNTER — Telehealth: Payer: Self-pay

## 2021-07-25 NOTE — Telephone Encounter (Signed)
LVM to return call and to make appt w/ PCP

## 2021-07-30 ENCOUNTER — Encounter: Payer: Self-pay | Admitting: Family Medicine

## 2021-07-30 ENCOUNTER — Ambulatory Visit (INDEPENDENT_AMBULATORY_CARE_PROVIDER_SITE_OTHER): Payer: Medicare Other | Admitting: Family Medicine

## 2021-07-30 VITALS — BP 116/54 | HR 55 | Resp 16 | Wt 229.3 lb

## 2021-07-30 DIAGNOSIS — R1084 Generalized abdominal pain: Secondary | ICD-10-CM

## 2021-07-30 DIAGNOSIS — R748 Abnormal levels of other serum enzymes: Secondary | ICD-10-CM

## 2021-07-30 DIAGNOSIS — B351 Tinea unguium: Secondary | ICD-10-CM | POA: Diagnosis not present

## 2021-07-30 DIAGNOSIS — K8050 Calculus of bile duct without cholangitis or cholecystitis without obstruction: Secondary | ICD-10-CM

## 2021-07-30 DIAGNOSIS — K750 Abscess of liver: Secondary | ICD-10-CM | POA: Diagnosis not present

## 2021-07-30 DIAGNOSIS — M79675 Pain in left toe(s): Secondary | ICD-10-CM | POA: Diagnosis not present

## 2021-07-30 DIAGNOSIS — K8309 Other cholangitis: Secondary | ICD-10-CM | POA: Diagnosis not present

## 2021-07-30 DIAGNOSIS — I350 Nonrheumatic aortic (valve) stenosis: Secondary | ICD-10-CM

## 2021-07-30 DIAGNOSIS — M79674 Pain in right toe(s): Secondary | ICD-10-CM | POA: Diagnosis not present

## 2021-07-30 DIAGNOSIS — I2510 Atherosclerotic heart disease of native coronary artery without angina pectoris: Secondary | ICD-10-CM

## 2021-07-30 NOTE — H&P (View-Only) (Signed)
Established patient visit  I,[name],acting as a scribe for Jacob Durie, MD.,have documented all relevant documentation on the behalf of Jacob Durie, MD,as directed by  Jacob Durie, MD while in the presence of Jacob Durie, MD.   Patient: Jacob Montes   DOB: 22-Nov-1940   81 y.o. Male  MRN: [id number] Visit Date: 07/30/2021  Today's healthcare provider: [name], MD   Chief Complaint  Patient presents with   Hospitalization Follow-up   Subjective    HPI  This is a transition of care visit for hospital admission cholangitis, fever, probable microliver abscesses. Patient is receiving IV antibiotics at home 3 times a day in the form of meropenem. He is still very weak but is afebrile and is starting to get his  strength back. His wife brings him in today.  Follow up Hospitalization  Patient was admitted to [facility] on 07/13/2021 and discharged on 07/22/2021. He was treated for Fever. Treatment for this included see notes in chart. Telephone follow up was done on 07/25/2021 He reports Fever compliance with treatment. He reports this condition is improved.  ----------------------------------------------------------------------------------------- -   Medications: Outpatient Medications Prior to Visit  Medication Sig   amLODipine (NORVASC) 10 MG tablet Take 1 tablet by mouth once daily   aspirin 81 MG chewable tablet Chew 81 mg by mouth daily.   docusate sodium (COLACE) 250 MG capsule Take 250 mg by mouth daily as needed for constipation.    furosemide (LASIX) 40 MG tablet Take 1 tablet (40 mg total) by mouth 3 (three) times a week. (Patient taking differently: Take 40 mg by mouth 3 (three) times a week. Montes/wed/fri)   meropenem (MERREM) IVPB Inject 1 g into the vein every 8 (eight) hours for 16 days. Indication:  Liver phlegmon and ascending cholangitis First Dose: Yes Last Day of Therapy:  08/07/2021 Labs - Once weekly:  CBC/D and  CMP, Please pull PIC at completion of IV antibiotics Fax weekly labs to Dr.[phone] Method of administration: Mini-Bag Plus / Gravity Method of administration may be changed at the discretion of home infusion pharmacist based upon assessment of the patient and/or caregiver's ability to self-administer the medication ordered.   metoprolol tartrate (LOPRESSOR) 25 MG tablet Take 1 tablet (25 mg total) by mouth 2 (two) times daily.   montelukast (SINGULAIR) 10 MG tablet Take 1 tablet (10 mg total) by mouth at bedtime as needed (allergies).   Multiple Vitamin (MULTIVITAMIN) tablet Take 1 tablet by mouth daily.   omeprazole (PRILOSEC) 20 MG capsule Take 20 mg by mouth daily. As needed   rosuvastatin (CRESTOR) 20 MG tablet Take 1 tablet (20 mg total) by mouth daily.   tamsulosin (FLOMAX) 0.4 MG CAPS capsule Take 1 capsule by mouth once daily   potassium chloride (KLOR-CON) 10 MEQ tablet Take 1 tablet (10 mEq total) by mouth 3 (three) times a week. Take with lasix (Patient taking differently: Take 10 mEq by mouth 3 (three) times a week. Take with lasix Montes wed frid)   No facility-administered medications prior to visit.    Review of Systems      Objective    BP (!) 116/54 (BP Location: Left Arm, Patient Position: Sitting, Cuff Size: Large)   Pulse (!) 55   Resp 16   Wt 229 lb 4.8 oz (104 kg)   SpO2 97%   BMI 37.01 kg/m     Physical Exam Vitals reviewed.  Constitutional:  Appearance: Normal appearance.  HENT:     Head: Normocephalic and atraumatic.     Right Ear: External ear normal.     Left Ear: External ear normal.  Eyes:     General: No scleral icterus.    Conjunctiva/sclera: Conjunctivae normal.  Cardiovascular:     Rate and Rhythm: Normal rate and regular rhythm.     Pulses: Normal pulses.     Heart sounds: Normal heart sounds.  Pulmonary:     Effort: Pulmonary effort is normal.     Breath sounds: Normal breath sounds.  Abdominal:     General: Bowel  sounds are normal. There is no distension.     Palpations: Abdomen is soft.     Tenderness: There is no abdominal tenderness.  Musculoskeletal:     Right lower leg: No edema.     Left lower leg: No edema.  Skin:    General: Skin is warm and dry.  Neurological:     General: No focal deficit present.     Mental Status: He is alert and oriented to person, place, and time.  Psychiatric:        Mood and Affect: Mood normal.        Behavior: Behavior normal.        Thought Content: Thought content normal.        Judgment: Judgment normal.       No results found for any visits on 07/30/21.  Assessment & Plan     1. Acute cholangitis Resolving.  I think the patient had a common stone which led to the cholangitis followed by microabscesses to the liver.  He is responding nicely to treatment.  Follow-up with GI and ID.  2. Generalized abdominal pain Resolved  3. Elevated liver enzymes Resolved  4. 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 Aortic valve stenosis, etiology of cardiac valve disease unspecified   7. Liver abscess On IV antibiotics   No follow-ups on file.      I, Jacob Durie, MD, have reviewed all documentation for this visit. The documentation on 08/03/21 for the exam, diagnosis, procedures, and orders are all accurate and complete.    Jacob Morejon Cranford Mon, MD  [facility] [phone] (phone) [phone] (fax)  [place]

## 2021-07-30 NOTE — Progress Notes (Unsigned)
      Established patient visit  I,Sherrilynn Gudgel,acting as a scribe for [name], MD.,have documented all relevant documentation on the behalf of [name], MD,as directed by  [name], MD while in the presence of [name], MD.   Patient: Jacob Montes   DOB: 1940-07-18   80 y.o. Male  MRN: [id number] Visit Date: 07/30/2021  Today's healthcare provider: [name], MD   Chief Complaint  Patient presents with   Hospitalization Follow-up   Subjective    HPI  Follow up Hospitalization  Patient was admitted to [facility] on 07/13/2021 and discharged on 07/22/2021. He was treated for Fever. Treatment for this included see notes in chart. Telephone follow up was done on 07/25/2021 He reports Fever compliance with treatment. He reports this condition is improved.  ----------------------------------------------------------------------------------------- -   Medications: Outpatient Medications Prior to Visit  Medication Sig   amLODipine (NORVASC) 10 MG tablet Take 1 tablet by mouth once daily   aspirin 81 MG chewable tablet Chew 81 mg by mouth daily.   docusate sodium (COLACE) 250 MG capsule Take 250 mg by mouth daily as needed for constipation.    furosemide (LASIX) 40 MG tablet Take 1 tablet (40 mg total) by mouth 3 (three) times a week. (Patient taking differently: Take 40 mg by mouth 3 (three) times a week. Mon/wed/fri)   meropenem (MERREM) IVPB Inject 1 g into the vein every 8 (eight) hours for 16 days. Indication:  Liver phlegmon and ascending cholangitis First Dose: Yes Last Day of Therapy:  08/07/2021 Labs - Once weekly:  CBC/D and CMP, Please pull PIC at completion of IV antibiotics Fax weekly labs to Dr.[phone] Method of administration: Mini-Bag Plus / Gravity Method of administration may be changed at the discretion of home infusion pharmacist based upon assessment of the patient and/or caregiver's ability to  self-administer the medication ordered.   metoprolol tartrate (LOPRESSOR) 25 MG tablet Take 1 tablet (25 mg total) by mouth 2 (two) times daily.   montelukast (SINGULAIR) 10 MG tablet Take 1 tablet (10 mg total) by mouth at bedtime as needed (allergies).   Multiple Vitamin (MULTIVITAMIN) tablet Take 1 tablet by mouth daily.   omeprazole (PRILOSEC) 20 MG capsule Take 20 mg by mouth daily. As needed   rosuvastatin (CRESTOR) 20 MG tablet Take 1 tablet (20 mg total) by mouth daily.   tamsulosin (FLOMAX) 0.4 MG CAPS capsule Take 1 capsule by mouth once daily   potassium chloride (KLOR-CON) 10 MEQ tablet Take 1 tablet (10 mEq total) by mouth 3 (three) times a week. Take with lasix (Patient taking differently: Take 10 mEq by mouth 3 (three) times a week. Take with lasix mon wed frid)   No facility-administered medications prior to visit.    Review of Systems      Objective    BP (!) 116/54 (BP Location: Left Arm, Patient Position: Sitting, Cuff Size: Large)   Pulse (!) 55   Resp 16   Wt 229 lb 4.8 oz (104 kg)   SpO2 97%   BMI 37.01 kg/m     Physical Exam  ***  No results found for any visits on 07/30/21.  Assessment & Plan     ***  No follow-ups on file.      {provider attestation***:1}   [name], MD  [facility] [phone] (phone) [phone] (fax)  [place]

## 2021-08-01 DIAGNOSIS — K750 Abscess of liver: Secondary | ICD-10-CM | POA: Diagnosis not present

## 2021-08-05 ENCOUNTER — Encounter: Payer: Self-pay | Admitting: Infectious Diseases

## 2021-08-05 ENCOUNTER — Ambulatory Visit: Payer: Medicare Other | Attending: Infectious Diseases | Admitting: Infectious Diseases

## 2021-08-05 VITALS — BP 117/51 | HR 56 | Temp 97.7°F | Ht 66.0 in | Wt 227.0 lb

## 2021-08-05 DIAGNOSIS — K831 Obstruction of bile duct: Secondary | ICD-10-CM | POA: Diagnosis not present

## 2021-08-05 DIAGNOSIS — R109 Unspecified abdominal pain: Secondary | ICD-10-CM | POA: Diagnosis not present

## 2021-08-05 DIAGNOSIS — I2510 Atherosclerotic heart disease of native coronary artery without angina pectoris: Secondary | ICD-10-CM | POA: Diagnosis not present

## 2021-08-05 DIAGNOSIS — Z951 Presence of aortocoronary bypass graft: Secondary | ICD-10-CM | POA: Insufficient documentation

## 2021-08-05 DIAGNOSIS — R7989 Other specified abnormal findings of blood chemistry: Secondary | ICD-10-CM | POA: Diagnosis not present

## 2021-08-05 DIAGNOSIS — I350 Nonrheumatic aortic (valve) stenosis: Secondary | ICD-10-CM | POA: Insufficient documentation

## 2021-08-05 DIAGNOSIS — K750 Abscess of liver: Secondary | ICD-10-CM | POA: Diagnosis not present

## 2021-08-05 DIAGNOSIS — Z9049 Acquired absence of other specified parts of digestive tract: Secondary | ICD-10-CM | POA: Diagnosis not present

## 2021-08-06 ENCOUNTER — Telehealth: Payer: Self-pay

## 2021-08-06 NOTE — Telephone Encounter (Signed)
Received call from [place] with [facility] requesting pull PICC orders. Relayed that okay to pull PICC after last dose on 08/07/21 per Dr. Delaine Lame. Orders repeated and verified.   Beryle Flock, RN

## 2021-08-07 ENCOUNTER — Ambulatory Visit: Payer: Self-pay

## 2021-08-07 NOTE — Telephone Encounter (Signed)
Patient's wife called and spoke to the [place] about the PICC line removal. Advised the agent to let the wife know the message will be reviewed by the provider and someone will call her back. Called office and spoke to [place], [place], routing to the provider for review and recommendation to the patient's wife.

## 2021-08-07 NOTE — Telephone Encounter (Signed)
Advised patient that his home health nurse had orders to pull the PICC line

## 2021-08-07 NOTE — Telephone Encounter (Signed)
Summary: PICC line removal   Pt wife stated pt has been home for 17 days with IV antibiotics. Today is the last day that he has IV antibiotics.  Home health people can't come over today; can a nurse come and take the PICC line out today.   Pt wife seeking clinical advice.     Called wife to get number to home health nurse. [place] (formerly Conservation officer, historic buildings) and spoke with [place]. Junious Silk that pt is leaving in the am for a trip and that PICC line needs to be removed. Was advised that [place] will speak to her clinical leader and will call pt's wife.  Called and spoke to Pt's wife and advised her of plan. Mrs. Biermann verbalized understanding. Reason for Disposition  General information question, no triage required and triager able to answer question  Answer Assessment - Initial Assessment Questions 1. REASON FOR CALL or QUESTION: "What is your reason for calling today?" or "How can I best help you?" or "What question do you have that I can help answer?"     Summary: PICC line removal   Pt wife stated pt has been home for 17 days with IV antibiotics. Today is the last day that he has IV antibiotics.  Homehealth people can't come over today; can a nurse come and take the PICC line out today.   Pt wife seeking clinical advice.  Protocols used: Information Only Call - No Triage-A-AH

## 2021-08-13 ENCOUNTER — Encounter: Payer: Self-pay | Admitting: Gastroenterology

## 2021-08-13 ENCOUNTER — Ambulatory Visit (INDEPENDENT_AMBULATORY_CARE_PROVIDER_SITE_OTHER): Payer: Medicare Other | Admitting: Gastroenterology

## 2021-08-13 VITALS — BP 129/62 | HR 57 | Temp 97.9°F | Ht 66.0 in | Wt 226.0 lb

## 2021-08-13 DIAGNOSIS — R935 Abnormal findings on diagnostic imaging of other abdominal regions, including retroperitoneum: Secondary | ICD-10-CM

## 2021-08-13 NOTE — Progress Notes (Signed)
Primary Care Physician: Jerrol Banana., MD  Primary Gastroenterologist:  Dr. [name]  Chief Complaint  Patient presents with   Follow-up    HPI: Jacob Montes is a 81 y.o. male here who comes for follow-up after being discharged from the hospital with choledocholithiasis and an ERCP. 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  The patient states he has been doing well since he stopped the antibiotics for what appears to be multiple liver abscesses.  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     docusate sodium (COLACE) 250 MG capsule Take 250 mg by mouth daily as needed for constipation.      furosemide (LASIX) 40 MG tablet Take 1 tablet (40 mg total) by mouth 3 (three) times a week. (Patient taking differently: Take 40 mg by mouth 3 (three) times a week. Mon/wed/fri) 39 tablet 3   metoprolol tartrate (LOPRESSOR) 25 MG tablet Take 1 tablet (25 mg total) by mouth 2 (two) times daily. 180 tablet 3   montelukast (SINGULAIR) 10 MG tablet Take 1 tablet (10 mg total) by mouth at bedtime as needed (allergies). 30 tablet 3   Multiple Vitamin (MULTIVITAMIN) tablet Take 1 tablet by mouth daily.     omeprazole  (PRILOSEC) 20 MG capsule Take 20 mg by mouth daily. As needed     rosuvastatin (CRESTOR) 20 MG tablet Take 1 tablet (20 mg total) by mouth daily. 90 tablet 0   tamsulosin (FLOMAX) 0.4 MG CAPS capsule Take 1 capsule by mouth once daily 90 capsule 0   potassium chloride (KLOR-CON) 10 MEQ tablet Take 1 tablet (10 mEq total) by mouth 3 (three) times a week. Take with lasix (Patient taking differently: Take 10 mEq by mouth 3 (three) times a week. Take with lasix mon wed frid) 90 tablet 3   No current facility-administered medications for this visit.    Allergies as of 08/13/2021 - Review Complete 08/13/2021  Allergen Reaction Noted   Atorvastatin Other (See Comments) 06/11/2005   Ramipril Other (See Comments) 01/12/2006    ROS:  General: Negative for anorexia, weight loss, fever, chills, fatigue, weakness. ENT: Negative for hoarseness, difficulty swallowing , nasal congestion. CV: Negative for chest pain, angina, palpitations, dyspnea on exertion, peripheral edema.  Respiratory: Negative for dyspnea at rest, dyspnea on exertion, cough, sputum, wheezing.  GI: See history of present illness. GU:  Negative for dysuria, hematuria, urinary incontinence, urinary frequency, nocturnal urination.  Endo: Negative for unusual weight change.    Physical Examination:   BP 129/62   Pulse (!) 57  Temp 97.9 F (36.6 C) (Oral)   Ht '5\' 6"'$  (1.676 m)   Wt 226 lb (102.5 kg)   BMI 36.48 kg/m   General: Well-nourished, well-developed in no acute distress.  Eyes: No icterus. Conjunctivae pink. Neuro: Alert and oriented x 3.  Grossly intact. Skin: Warm and dry, no jaundice.   Psych: Alert and cooperative, normal mood and affect.  Labs:    Imaging Studies: [place] EKG SITE RITE  Result Date: 07/21/2021 If Site Rite image not attached, placement could not be confirmed due to current cardiac rhythm.  CT ABDOMEN PELVIS W CONTRAST  Result Date: 07/21/2021 CLINICAL DATA:  Intra-abdominal abscess/recurrent  fevers in a 81 year old male with recent diagnosis of hepatic abnormalities. EXAM: CT ABDOMEN AND PELVIS WITH CONTRAST TECHNIQUE: Multidetector CT imaging of the abdomen and pelvis was performed using the standard protocol following bolus administration of intravenous contrast. RADIATION DOSE REDUCTION: This exam was performed according to the departmental dose-optimization program which includes automated exposure control, adjustment of the mA and/or kV according to patient size and/or use of iterative reconstruction technique. CONTRAST:  192m OMNIPAQUE IOHEXOL 300 MG/ML  SOLN COMPARISON:  July 14, 2021. FINDINGS: Lower chest: Basilar atelectasis. No effusion or consolidation. Signs of trans arterial aortic valve replacement. Hepatobiliary: Small locules of gas are present in the LEFT hemiliver amidst the area of low attenuation that was discussed previously, now perhaps better defined measuring approximately 4.2 x 4.0 cm with small locules of gas along the margin. Other areas that were seen on previous MR imaging are not appreciated on the current study. This focal area in the lateral segment of the LEFT hepatic lobe was not appreciated as an abnormality on the MRI evaluation from Jul 08, 2021. The portal vein is grossly patent though somewhat diminutive. There are signs of portal venous hypertension with collateral pathways in the LEFT retroperitoneum and recanalized umbilical vein. No frank abscess is identified. Pancreas: Pancreatic atrophy without signs of inflammation or ductal dilation. Spleen: Calcification along the margin of the spleen which is top-normal size. No focal splenic lesion. Adrenals/Urinary Tract: Adrenal glands are normal. Smooth renal contours without hydronephrosis. No perivesical stranding. No suspicious renal lesion. No perinephric stranding. Stomach/Bowel: Normal appendix. Colonic diverticulosis and diverticular disease mainly in the sigmoid colon. Vascular/Lymphatic: Aortic  atherosclerosis. No sign of aneurysm. Smooth contour of the IVC. There is no gastrohepatic or hepatoduodenal ligament lymphadenopathy. No retroperitoneal or mesenteric lymphadenopathy. No pelvic sidewall lymphadenopathy. Reproductive: Unremarkable to the extent evaluated on CT. Other: No ascites. Musculoskeletal: Separation of the inferior margin of the sternotomy without change. No acute musculoskeletal process or destructive bone finding. IMPRESSION: 1. Area of low attenuation in the LEFT hepatic lobe is better defined than on previous imaging in the lateral segment of the LEFT hepatic lobe which shows no frank evidence of fluid density. Other areas may be improved but are not as well evaluated on CT. Findings may represent phlegmon and should be followed closely to ensure no developing abscess. 2. Small locules of gas adjacent to this area are favored to be related to recent ERCP and sphincterotomy. 3. Close follow-up is suggested. Would correlate with any continued symptoms of cholangitis. 4. Signs of liver disease/cirrhosis and portal hypertension 5. Colonic diverticulosis and diverticular disease mainly in the sigmoid colon. 6. Aortic atherosclerosis. Aortic Atherosclerosis (ICD10-I70.0). Electronically Signed   By: GZetta BillsM.D.   On: 07/21/2021 15:24   [place] Img Lower Bilateral (DVT)  Result Date: 07/18/2021 CLINICAL DATA:  Bilateral lower extremity swelling  EXAM: BILATERAL LOWER EXTREMITY VENOUS DOPPLER ULTRASOUND TECHNIQUE: Gray-scale sonography with graded compression, as well as color Doppler and duplex ultrasound were performed to evaluate the lower extremity deep venous systems from the level of the common femoral vein and including the common femoral, femoral, profunda femoral, popliteal and calf veins including the posterior tibial, peroneal and gastrocnemius veins when visible. Spectral Doppler was utilized to evaluate flow at rest and with distal augmentation maneuvers in the common  femoral, femoral and popliteal veins. COMPARISON:  None Available. FINDINGS: RIGHT LOWER EXTREMITY Common Femoral Vein: No evidence of thrombus. Normal compressibility, respiratory phasicity and response to augmentation. Saphenofemoral Junction: No evidence of thrombus. Normal compressibility and flow on color Doppler imaging. Profunda Femoral Vein: No evidence of thrombus. Normal compressibility and flow on color Doppler imaging. Femoral Vein: No evidence of thrombus. Normal compressibility, respiratory phasicity and response to augmentation. Popliteal Vein: No evidence of thrombus. Normal compressibility, respiratory phasicity and response to augmentation. Calf Veins: No evidence of thrombus. Normal compressibility and flow on color Doppler imaging. Other Findings: Right popliteal fossa Baker's cyst measures 1.6 x 2.3 x 0.8 cm LEFT LOWER EXTREMITY Common Femoral Vein: No evidence of thrombus. Normal compressibility, respiratory phasicity and response to augmentation. Saphenofemoral Junction: No evidence of thrombus. Normal compressibility and flow on color Doppler imaging. Profunda Femoral Vein: No evidence of thrombus. Normal compressibility and flow on color Doppler imaging. Femoral Vein: No evidence of thrombus. Normal compressibility, respiratory phasicity and response to augmentation. Popliteal Vein: No evidence of thrombus. Normal compressibility, respiratory phasicity and response to augmentation. Calf Veins: No evidence of thrombus. Normal compressibility and flow on color Doppler imaging. IMPRESSION: No evidence of deep venous thrombosis in either lower extremity. Electronically Signed   By: Jerilynn Mages.  Shick M.D.   On: 07/18/2021 16:23   ECHOCARDIOGRAM COMPLETE  Result Date: 07/17/2021    ECHOCARDIOGRAM REPORT   Patient Name:   CHOZEN LATULIPPE Date of Exam: 07/17/2021 Medical Rec #:  [id number]   Height:       66.0 in Accession #:    [id number]  Weight:       231.3 lb Date of Birth:  19-May-1940   BSA:          2.127  m Patient Age:    15 years    BP:           161/57 mmHg Patient Gender: M           HR:           69 bpm. Exam Location:  [facility] Procedure: 2D Echo, Cardiac Doppler and Color Doppler Indications:     R50.9 Fever  History:         Patient has prior history of Echocardiogram examinations, most                  recent 01/16/2021. CAD, Prior CABG, Stroke; Risk                  Factors:Hypertension, Dyslipidemia and Sleep Apnea. S/P TAVR                  03/2017.  Sonographer:     Charmayne Sheer Referring Phys:  [id number] Tsosie Billing Diagnosing Phys: Ida Rogue MD  Sonographer Comments: Technically difficult study due to poor echo windows. Image acquisition challenging due to patient body habitus. IMPRESSIONS  1. No valve vegetation, though challenging images  2. Left ventricular ejection fraction, by estimation, is 60 to 65%. The left ventricle has normal function.  The left ventricle has no regional wall motion abnormalities. Left ventricular diastolic parameters are consistent with Grade II diastolic dysfunction (pseudonormalization).  3. Right ventricular systolic function is normal. The right ventricular size is normal. Tricuspid regurgitation signal is inadequate for assessing PA pressure.  4. Left atrial size was moderately dilated.  5. The mitral valve is normal in structure. Mild to moderate mitral valve regurgitation. No evidence of mitral stenosis.  6. The aortic valve was not well visualized. Aortic valve regurgitation is not visualized. Moderate aortic valve stenosis. Aortic valve area, by VTI measures 0.99 cm. Aortic valve mean gradient measures 23.0 mmHg. Aortic valve Vmax measures 3.32 m/s.  7. The inferior vena cava is normal in size with greater than 50% respiratory variability, suggesting right atrial pressure of 3 mmHg. FINDINGS  Left Ventricle: Left ventricular ejection fraction, by estimation, is 60 to 65%. The left ventricle has normal function. The left ventricle has no regional wall motion  abnormalities. The left ventricular internal cavity size was normal in size. There is  no left ventricular hypertrophy. Left ventricular diastolic parameters are consistent with Grade II diastolic dysfunction (pseudonormalization). Right Ventricle: The right ventricular size is normal. No increase in right ventricular wall thickness. Right ventricular systolic function is normal. Tricuspid regurgitation signal is inadequate for assessing PA pressure. Left Atrium: Left atrial size was moderately dilated. Right Atrium: Right atrial size was normal in size. Pericardium: There is no evidence of pericardial effusion. Mitral Valve: The mitral valve is normal in structure. Mild to moderate mitral valve regurgitation. No evidence of mitral valve stenosis. MV peak gradient, 8.9 mmHg. The mean mitral valve gradient is 3.0 mmHg. Tricuspid Valve: The tricuspid valve is normal in structure. Tricuspid valve regurgitation is not demonstrated. No evidence of tricuspid stenosis. Aortic Valve: The aortic valve was not well visualized. Aortic valve regurgitation is not visualized. Moderate aortic stenosis is present. Aortic valve mean gradient measures 23.0 mmHg. Aortic valve peak gradient measures 44.1 mmHg. Aortic valve area, by  VTI measures 0.99 cm. Pulmonic Valve: The pulmonic valve was normal in structure. Pulmonic valve regurgitation is not visualized. No evidence of pulmonic stenosis. Aorta: The aortic root is normal in size and structure. Venous: The inferior vena cava is normal in size with greater than 50% respiratory variability, suggesting right atrial pressure of 3 mmHg. IAS/Shunts: No atrial level shunt detected by color flow Doppler.  LEFT VENTRICLE PLAX 2D LVIDd:         5.66 cm   Diastology LVIDs:         4.38 cm   LV e' medial:    5.00 cm/s LV PW:         1.04 cm   LV E/e' medial:  25.6 LV IVS:        0.99 cm   LV e' lateral:   7.72 cm/s LVOT diam:     1.90 cm   LV E/e' lateral: 16.6 LV SV:         71 LV SV Index:    33 LVOT Area:     2.84 cm  RIGHT VENTRICLE RV Basal diam:  3.64 cm RV S prime:     13.50 cm/s LEFT ATRIUM             Index LA diam:        5.70 cm 2.68 cm/m LA Vol (A2C):   63.8 ml 29.99 ml/m LA Vol (A4C):   98.8 ml 46.45 ml/m LA Biplane Vol: 84.4 ml 39.68 ml/m  AORTIC  VALVE                     PULMONIC VALVE AV Area (Vmax):    1.03 cm      PV Vmax:       1.33 m/s AV Area (Vmean):   1.04 cm      PV Vmean:      85.600 cm/s AV Area (VTI):     0.99 cm      PV VTI:        0.258 m AV Vmax:           332.00 cm/s   PV Peak grad:  7.1 mmHg AV Vmean:          228.000 cm/s  PV Mean grad:  3.0 mmHg AV VTI:            0.722 m AV Peak Grad:      44.1 mmHg AV Mean Grad:      23.0 mmHg LVOT Vmax:         121.00 cm/s LVOT Vmean:        83.900 cm/s LVOT VTI:          0.251 m LVOT/AV VTI ratio: 0.35  AORTA Ao Root diam: 3.40 cm MITRAL VALVE MV Area (PHT): 3.10 cm     SHUNTS MV Area VTI:   1.65 cm     Systemic VTI:  0.25 m MV Peak grad:  8.9 mmHg     Systemic Diam: 1.90 cm MV Mean grad:  3.0 mmHg MV Vmax:       1.49 m/s MV Vmean:      85.5 cm/s MV Decel Time: 245 msec MV E velocity: 128.00 cm/s MV A velocity: 101.00 cm/s MV E/A ratio:  1.27 Ida Rogue MD Electronically signed by Ida Rogue MD Signature Date/Time: 07/17/2021/12:40:07 PM    Final    CT ABDOMEN PELVIS W CONTRAST  Result Date: 07/14/2021 CLINICAL DATA:  MD NOTE; son at bedside patient was discharged June 3 at home with diagnosis of acute cholangitis and to finish up oral antibiotic as outpatient. He started having fever high-grade fever over the weekend. EXAM: CT ABDOMEN AND PELVIS WITH CONTRAST TECHNIQUE: Multidetector CT imaging of the abdomen and pelvis was performed using the standard protocol following bolus administration of intravenous contrast. RADIATION DOSE REDUCTION: This exam was performed according to the departmental dose-optimization program which includes automated exposure control, adjustment of the mA and/or kV according to patient  size and/or use of iterative reconstruction technique. CONTRAST:  149m OMNIPAQUE IOHEXOL 300 MG/ML  SOLN COMPARISON:  MRI on 07/08/2021, ultrasound of the abdomen on 07/13/2021, CT of the abdomen and pelvis on 11/15/2020 FINDINGS: Lower chest: Lung bases are unremarkable. Partially imaged aortic valve replacement. Hepatobiliary: Prior cholecystectomy. Liver contour is normal. There is mildly heterogeneous appearance of the dome of the LEFT hepatic lobe, with numerous subtle low-attenuation areas measuring 5 millimeters or smaller. Pancreas: Unremarkable. No pancreatic ductal dilatation or surrounding inflammatory changes. Spleen: Normal in size without focal abnormality. Adrenals/Urinary Tract: Adrenal glands are normal. Kidneys are symmetric in size and enhancement. Tiny low-attenuation lesion in the RIGHT UPPER pole region is consistent with small cyst based on Hounsfield unit criteria. Punctate intrarenal calculus identified in the UPPER pole of the LEFT kidney. The ureters are unremarkable without evidence for urinary tract obstruction. The bladder and visualized portion of the urethra are normal. Stomach/Bowel: Stomach and small bowel loops are normal in appearance. There are numerous colonic diverticula. No acute  diverticulitis. The appendix is well seen and normal in appearance. Vascular/Lymphatic: There is atherosclerotic calcification of the abdominal aorta. No associated aneurysm. Portacaval lymph node is 1.1 centimeters on image 29 of series 2. RIGHT external iliac node is 0.8 centimeters on image 77 of series 2. Reproductive: Prostate is unremarkable. Other: Fat containing bilateral inguinal hernias noted. There is focal subcutaneous gas in the LEFT anterior abdominal wall, consistent with injection site. Musculoskeletal: There are degenerative changes primarily in the LOWER lumbar spine and at the lumbosacral junction. No lytic or blastic lesions. Previous median sternotomy. IMPRESSION: 1. Mildly  heterogeneous appearance of the liver, with tiny, less than 5 millimeter lesions. The appearance is similar to findings on recent MRI and consistent with inflammatory changes. Small abscesses cannot be excluded. 2. Cholecystectomy. 3. Portacaval lymph node is 1.1 centimeters, likely reactive. 4. Possible nonobstructing intrarenal calculus in the UPPER pole of the LEFT kidney. 5. Colonic diverticulosis. 6.  Aortic atherosclerosis.  (ICD10-I70.0) 7. Bilateral fat containing inguinal hernias. Electronically Signed   By: Nolon Nations M.D.   On: 07/14/2021 17:14    Assessment and Plan:   Jacob Montes is a 81 y.o. y/o male who comes in today with a history of choledocholithiasis with cholangitis and multiple liver abscesses.  The patient is being followed by infectious disease.  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  The patient will be set up for an EUS to evaluate this lesion.  The patient and his wife have been explained the plan and agree with it.     [name], MD. Marval Regal    Note: This dictation was prepared with Dragon dictation along with smaller phrase technology. Any transcriptional errors that result from this process are unintentional.

## 2021-08-14 ENCOUNTER — Telehealth: Payer: Self-pay

## 2021-08-14 NOTE — Telephone Encounter (Signed)
Msg sent to Mariea Clonts to set up EUS and pt is aware

## 2021-08-16 NOTE — Progress Notes (Unsigned)
Cardiology Office Note  Date:  08/16/2021   ID:  Jacob Montes, DOB [birth year], Jacob Montes  PCP:  Jacob Montes., Jacob Montes   No chief complaint on file.   HPI:  [name] is a 81 year old gentleman with coronary artery disease, bypass surgery three-vessel June 2009,  aortic valve stenosis with TAVR July 2018,  carotid stenosis with carotid endarterectomy 2009,  DVT/PE 2012,  prior smoker,  sleep apnea, not on CPAP Seen in the hospital October 2020  with abdominal pain, Rigors, chills fever  Common bile duct stone 8 mm with transaminitis , had ERCP Demand ischemia in the setting of above Who presents to establish care in the [place] office for his coronary disease, aortic valve prosthesis/TAVR  Some SOB when walking Takes lasix once a week Has edema No regular exercise program, poor conditioning Denies anginal symptoms Denies having episodes of tachycardia  Weight up 5 pounds in 2 months  Heart rate slow rate 47 today, on metoprolol 37.5 BID No symptoms  EKG personally reviewed by myself on todays visit NSR rate 47 IVCD  Other past medical hx TEE October 2020, Ejection fraction 60 to 65%, well-functioning aortic valve prosthesis trivial regurg  Lab work reviewed HBA1C 5.6 Total chol 133  Last heart catheterization November 2018   PMH:   has a past medical history of Aortic stenosis, CAD (coronary artery disease), Carotid artery disease ([place]), Cataract, DVT (deep venous thrombosis) ([place]) (08/2010), 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  PSH:    Past Surgical History:  Procedure Laterality Date   Carotid surgery Left 03/08/2007   CHOLECYSTECTOMY  02/16/2018   ENDOSCOPIC RETROGRADE CHOLANGIOPANCREATOGRAPHY (ERCP) WITH PROPOFOL N/A 07/10/2021    Procedure: ENDOSCOPIC RETROGRADE CHOLANGIOPANCREATOGRAPHY (ERCP) WITH PROPOFOL;  Surgeon: [name], Jacob Montes;  Location: [facility] ENDOSCOPY;  Service: Endoscopy;  Laterality: N/A;   ERCP N/A 11/18/2018   Procedure: ENDOSCOPIC RETROGRADE CHOLANGIOPANCREATOGRAPHY (ERCP);  Surgeon: [name], Jacob Montes;  Location: [facility] ENDOSCOPY;  Service: Endoscopy;  Laterality: N/A;   Heart Bypass N/A 07/2007   left shoulder surgery Left 06/03/2009   RIGHT AND LEFT HEART CATH  07/18/2007   right shoulder surgery  2003   TEE WITHOUT CARDIOVERSION N/A 11/21/2018   Procedure: TRANSESOPHAGEAL ECHOCARDIOGRAM (TEE);  Surgeon: Wellington Hampshire, Jacob Montes;  Location: [facility] ORS;  Service: Cardiovascular;  Laterality: N/A;   TVAR      Current Outpatient Medications  Medication Sig Dispense Refill   amLODipine (NORVASC) 10 MG tablet Take 1 tablet by mouth once daily 90 tablet 3   aspirin 81 MG chewable tablet Chew 81 mg by mouth daily.     docusate sodium (COLACE) 250 MG capsule Take 250 mg by mouth daily as needed for constipation.      furosemide (LASIX) 40 MG tablet Take 1 tablet (40 mg total) by mouth 3 (three) times a week. (Patient taking differently: Take 40 mg by mouth 3 (three) times a week. Mon/wed/fri) 39 tablet 3   metoprolol tartrate (LOPRESSOR) 25 MG tablet Take 1 tablet (25 mg total) by mouth 2 (two) times daily. 180 tablet 3   montelukast (SINGULAIR) 10 MG tablet Take 1 tablet (10 mg total) by mouth at bedtime as needed (allergies). 30 tablet 3   Multiple Vitamin (MULTIVITAMIN) tablet Take 1 tablet by mouth daily.     omeprazole (PRILOSEC) 20 MG capsule Take 20 mg by mouth daily. As needed  potassium chloride (KLOR-CON) 10 MEQ tablet Take 1 tablet (10 mEq total) by mouth 3 (three) times a week. Take with lasix (Patient taking differently: Take 10 mEq by mouth 3 (three) times a week. Take with lasix mon wed frid) 90 tablet 3   rosuvastatin (CRESTOR) 20 MG tablet Take 1 tablet (20 mg total) by mouth daily. 90 tablet 0    tamsulosin (FLOMAX) 0.4 MG CAPS capsule Take 1 capsule by mouth once daily 90 capsule 0   No current facility-administered medications for this visit.    Allergies:   Atorvastatin and Ramipril   Social History:  The patient  reports that he has quit smoking. His smoking use included cigarettes. He has never used smokeless tobacco. He reports that he does not drink alcohol and does not use drugs.   Family History:   family history includes Hypertension in his mother; Lung cancer in his father; Non-Hodgkin's lymphoma in his brother; Stroke in his mother.    Review of Systems: Review of Systems  Constitutional: Negative.   HENT: Negative.    Respiratory: Negative.    Cardiovascular: Negative.   Gastrointestinal: Negative.   Musculoskeletal: Negative.   Neurological: Negative.   Psychiatric/Behavioral: Negative.    All other systems reviewed and are negative.   PHYSICAL EXAM: VS:  There were no vitals taken for this visit. , BMI There is no height or weight on file to calculate BMI. Constitutional:  oriented to person, place, and time. No distress.  HENT:  Head: Grossly normal Eyes:  no discharge. No scleral icterus.  Neck: No JVD, no carotid bruits  Cardiovascular: Regular rate and rhythm, no murmurs appreciated Pulmonary/Chest: Clear to auscultation bilaterally, no wheezes or rails Abdominal: Soft.  no distension.  no tenderness.  Musculoskeletal: Normal range of motion Neurological:  normal muscle tone. Coordination normal. No atrophy Skin: Skin warm and dry Psychiatric: normal affect, pleasant  Recent Labs: 07/12/2021: Magnesium 2.1 07/16/2021: BUN 14; Potassium 3.9; Sodium 136 07/18/2021: ALT 61; Hemoglobin 11.6; Platelets 306; TSH 0.592 07/20/2021: Creatinine, Ser 0.49    Lipid Panel Lab Results  Component Value Date   CHOL 135 06/03/2020   HDL 47 06/03/2020   LDLCALC 67 06/03/2020   TRIG 114 06/03/2020      Wt Readings from Last 3 Encounters:  08/13/21 226 lb  (102.5 kg)  08/05/21 227 lb (103 kg)  07/30/21 229 lb 4.8 oz (104 kg)     ASSESSMENT AND PLAN:  Problem List Items Addressed This Visit   None Coronary artery disease with stable angina Blood pressure is well controlled on today's visit. No changes made to the medications.  Paroxysmal tachycardia Stable on metoprolol Metoprolol down to 25 BID for bradycardia  Aortic valve disease, stenosis S/p TAVR Echo end of 2022  Hyperlipidemia On Crestor 20 daily Cholesterol is at goal on the current lipid regimen. No changes to the medications were made.  Leg edema:  Lasix 40 mg up to 3x a week This should also help blood pressure  Total encounter time more than 25 minutes  Greater than 50% was spent in counseling and coordination of care with the patient   Signed, Esmond Plants, M.D., Ph.D. [place], [place]

## 2021-08-18 ENCOUNTER — Ambulatory Visit: Payer: Medicare Other | Admitting: Cardiovascular Disease

## 2021-08-18 ENCOUNTER — Encounter: Payer: Self-pay | Admitting: Cardiovascular Disease

## 2021-08-18 VITALS — BP 140/60 | HR 49 | Ht 66.0 in | Wt 228.0 lb

## 2021-08-18 DIAGNOSIS — I25118 Atherosclerotic heart disease of native coronary artery with other forms of angina pectoris: Secondary | ICD-10-CM | POA: Diagnosis not present

## 2021-08-18 DIAGNOSIS — I479 Paroxysmal tachycardia, unspecified: Secondary | ICD-10-CM | POA: Diagnosis not present

## 2021-08-18 DIAGNOSIS — Z952 Presence of prosthetic heart valve: Secondary | ICD-10-CM

## 2021-08-18 DIAGNOSIS — I6523 Occlusion and stenosis of bilateral carotid arteries: Secondary | ICD-10-CM

## 2021-08-18 DIAGNOSIS — I350 Nonrheumatic aortic (valve) stenosis: Secondary | ICD-10-CM | POA: Diagnosis not present

## 2021-08-18 DIAGNOSIS — I10 Essential (primary) hypertension: Secondary | ICD-10-CM

## 2021-08-18 DIAGNOSIS — E782 Mixed hyperlipidemia: Secondary | ICD-10-CM

## 2021-08-18 MED ORDER — FUROSEMIDE 40 MG PO TABS: ORAL_TABLET | ORAL | 3 refills | Status: DC

## 2021-08-18 MED ORDER — POTASSIUM CHLORIDE ER 10 MEQ PO TBCR: EXTENDED_RELEASE_TABLET | ORAL | 3 refills | Status: DC

## 2021-08-18 MED ORDER — ROSUVASTATIN CALCIUM 20 MG PO TABS: 20.0000 mg | ORAL_TABLET | Freq: Every day | ORAL | 3 refills | Status: DC

## 2021-08-18 NOTE — Patient Instructions (Addendum)
Monitor blood pressure, call with numbers Trying to get top number in 130 range, out of 140 range   Medication Instructions:  Please stop the metoprolol, heart rate is slow  Please increase the lasix  up to 5 days a week with potassium  If you need a refill on your cardiac medications before your next appointment, please call your pharmacy.   Lab work: No new labs needed  Testing/Procedures: Your physician has requested that you have a carotid duplex in 5 months. This test is an ultrasound of the carotid arteries in your neck. It looks at blood flow through these arteries that supply the brain with blood. Allow one hour for this exam. There are no restrictions or special instructions.   Follow-Up: At [facility], you and your health needs are our priority.  As part of our continuing mission to provide you with exceptional heart care, we have created designated Provider Care Teams.  These Care Teams include your primary Cardiologist (physician) and Advanced Practice Providers (APPs -  Physician Assistants and Nurse Practitioners) who all work together to provide you with the care you need, when you need it.  You will need a follow up appointment in 12 months  Providers on your designated Care Team:   Murray Hodgkins, NP [place] Faith, PA-C Cadence Kathlen Mody, [place]  COVID-19 Vaccine Information can be found at: [email or web address].co.uk For questions related to vaccine distribution or appointments, please email [email or web address] or call [phone].

## 2021-08-19 ENCOUNTER — Ambulatory Visit
Admission: RE | Admit: 2021-08-19 | Discharge: 2021-08-19 | Disposition: A | Discharge status: Home / Self Care | Payer: Medicare Other | Source: Ambulatory Visit | Attending: Infectious Diseases | Admitting: Infectious Diseases

## 2021-08-19 ENCOUNTER — Telehealth: Payer: Self-pay

## 2021-08-19 ENCOUNTER — Other Ambulatory Visit: Payer: Self-pay

## 2021-08-19 DIAGNOSIS — K750 Abscess of liver: Secondary | ICD-10-CM | POA: Insufficient documentation

## 2021-08-19 DIAGNOSIS — Z9049 Acquired absence of other specified parts of digestive tract: Secondary | ICD-10-CM | POA: Diagnosis not present

## 2021-08-19 NOTE — Telephone Encounter (Signed)
EUS has been scheduled for 08/21/21 with Dr. Mont Dutton. Instructions reviewed and copy of instructions sent to MyChart. Provided my contact information and encouraged to call with any questions.

## 2021-08-20 ENCOUNTER — Encounter: Payer: Self-pay | Admitting: Internal Medicine

## 2021-08-21 ENCOUNTER — Telehealth: Payer: Self-pay | Admitting: Infectious Diseases

## 2021-08-21 ENCOUNTER — Encounter: Payer: Self-pay | Admitting: Internal Medicine

## 2021-08-21 ENCOUNTER — Ambulatory Visit: Payer: Medicare Other | Admitting: General Practice

## 2021-08-21 ENCOUNTER — Encounter
Admission: RE | Disposition: A | Discharge status: Home / Self Care | Payer: Self-pay | Source: Ambulatory Visit | Attending: Internal Medicine

## 2021-08-21 ENCOUNTER — Other Ambulatory Visit: Payer: Self-pay

## 2021-08-21 ENCOUNTER — Ambulatory Visit
Admission: RE | Admit: 2021-08-21 | Discharge: 2021-08-21 | Disposition: A | Discharge status: Home / Self Care | Payer: Medicare Other | Source: Ambulatory Visit | Attending: Internal Medicine | Admitting: Internal Medicine

## 2021-08-21 DIAGNOSIS — R1084 Generalized abdominal pain: Secondary | ICD-10-CM | POA: Insufficient documentation

## 2021-08-21 DIAGNOSIS — I350 Nonrheumatic aortic (valve) stenosis: Secondary | ICD-10-CM | POA: Diagnosis not present

## 2021-08-21 DIAGNOSIS — K3189 Other diseases of stomach and duodenum: Secondary | ICD-10-CM | POA: Diagnosis not present

## 2021-08-21 DIAGNOSIS — R748 Abnormal levels of other serum enzymes: Secondary | ICD-10-CM | POA: Insufficient documentation

## 2021-08-21 DIAGNOSIS — I10 Essential (primary) hypertension: Secondary | ICD-10-CM | POA: Diagnosis not present

## 2021-08-21 DIAGNOSIS — K750 Abscess of liver: Secondary | ICD-10-CM | POA: Diagnosis not present

## 2021-08-21 DIAGNOSIS — K8050 Calculus of bile duct without cholangitis or cholecystitis without obstruction: Secondary | ICD-10-CM | POA: Diagnosis not present

## 2021-08-21 DIAGNOSIS — R935 Abnormal findings on diagnostic imaging of other abdominal regions, including retroperitoneum: Secondary | ICD-10-CM | POA: Diagnosis not present

## 2021-08-21 DIAGNOSIS — I639 Cerebral infarction, unspecified: Secondary | ICD-10-CM | POA: Diagnosis not present

## 2021-08-21 DIAGNOSIS — K319 Disease of stomach and duodenum, unspecified: Secondary | ICD-10-CM | POA: Diagnosis not present

## 2021-08-21 DIAGNOSIS — I2510 Atherosclerotic heart disease of native coronary artery without angina pectoris: Secondary | ICD-10-CM | POA: Diagnosis not present

## 2021-08-21 HISTORY — PX: EUS: SHX5427

## 2021-08-21 SURGERY — UPPER ENDOSCOPIC ULTRASOUND (EUS) LINEAR
Anesthesia: General

## 2021-08-21 MED ORDER — SODIUM CHLORIDE 0.9 % IV SOLN: INTRAVENOUS | Status: DC

## 2021-08-21 MED ORDER — LIDOCAINE HCL (CARDIAC) PF 100 MG/5ML IV SOSY
PREFILLED_SYRINGE | INTRAVENOUS | Status: DC | PRN
  Administered 2021-08-21: 80 mg via INTRAVENOUS

## 2021-08-21 MED ORDER — PROPOFOL 500 MG/50ML IV EMUL
INTRAVENOUS | Status: DC | PRN
  Administered 2021-08-21: 50 ug/kg/min via INTRAVENOUS

## 2021-08-21 MED ORDER — PROPOFOL 10 MG/ML IV BOLUS
INTRAVENOUS | Status: DC | PRN
  Administered 2021-08-21: 50 mg via INTRAVENOUS

## 2021-08-21 NOTE — Interval H&P Note (Signed)
History and Physical Interval Note:  08/21/2021 1:24 PM  Jacob Montes  has presented today for surgery, with the diagnosis of Gastric lesion.  The various methods of treatment have been discussed with the patient and family. After consideration of risks, benefits and other options for treatment, the patient has consented to  Procedure(s) with comments: UPPER ENDOSCOPIC ULTRASOUND (EUS) LINEAR (N/A) - [facility] as a surgical intervention.  The patient's history has been reviewed, patient examined, no change in status, stable for surgery.  I have reviewed the patient's chart and labs.  Questions were answered to the patient's satisfaction.     Tillie Rung

## 2021-08-21 NOTE — Transfer of Care (Signed)
Immediate Anesthesia Transfer of Care Note  Patient: Jacob Montes  Procedure(s) Performed: UPPER ENDOSCOPIC ULTRASOUND (EUS) LINEAR  Patient Location: PACU  Anesthesia Type:General  Level of Consciousness: sedated  Airway & Oxygen Therapy: Patient Spontanous Breathing and Patient connected to nasal cannula oxygen  Post-op Assessment: Report given to RN and Post -op Vital signs reviewed and stable  Post vital signs: Reviewed and stable  Last Vitals:  Vitals Value Taken Time  BP    Temp    Pulse    Resp    SpO2      Last Pain:  Vitals:   08/21/21 1245  TempSrc: Temporal  PainSc: 0-No pain         Complications: No notable events documented.

## 2021-08-21 NOTE — Anesthesia Preprocedure Evaluation (Signed)
Anesthesia Evaluation  Patient identified by MRN, date of birth, ID band Patient awake    Reviewed: Allergy & Precautions, NPO status , Patient's Chart, lab work & pertinent test results  History of Anesthesia Complications Negative for: history of anesthetic complications  Airway Mallampati: III  TM Distance: <3 FB Neck ROM: full    Dental  (+) Chipped, Poor Dentition, Missing   Pulmonary sleep apnea , former smoker,    Pulmonary exam normal        Cardiovascular Exercise Tolerance: Good hypertension, (-) angina+ CAD  Normal cardiovascular exam+ Valvular Problems/Murmurs AS      Neuro/Psych CVA negative psych ROS   GI/Hepatic Neg liver ROS, GERD  Controlled,  Endo/Other  negative endocrine ROS  Renal/GU Renal disease     Musculoskeletal  (+) Arthritis ,   Abdominal   Peds  Hematology negative hematology ROS (+)   Anesthesia Other Findings Past Medical History: No date: Aortic stenosis     Comment:  a. s/p TAVR 03/2017 No date: CAD (coronary artery disease)     Comment:  a. s/p 3-v CABG in 07/2007 No date: Carotid artery disease ([place])     Comment:  a. s/p left sided CEA complicated by RLN parlaysis  No date: Cataract 08/2010: DVT (deep venous thrombosis) ([place]) 11/16/2018: Generalized osteoarthritis No date: GERD (gastroesophageal reflux disease) No date: HLD (hyperlipidemia) 10/15/2015: Hyperlipidemia No date: Hypertension 11/16/2018: Nephrolithiasis 08/2010: Pulmonary embolism ([place]) No date: Skin cancer No date: Sleep apnea No date: Stroke [facility])  Past Surgical History: 03/08/2007: Carotid surgery; Left 02/16/2018: CHOLECYSTECTOMY 11/18/2018: ERCP; N/A     Comment:  Procedure: ENDOSCOPIC RETROGRADE               CHOLANGIOPANCREATOGRAPHY (ERCP);  Surgeon: [name],               MD;  Location: [facility] ENDOSCOPY;  Service: Endoscopy;                Laterality: N/A; 07/2007: Heart Bypass; N/A 06/03/2009:  left shoulder surgery; Left 07/18/2007: RIGHT AND LEFT HEART CATH 2003: right shoulder surgery 11/21/2018: TEE WITHOUT CARDIOVERSION; N/A     Comment:  Procedure: TRANSESOPHAGEAL ECHOCARDIOGRAM (TEE);                Surgeon: Wellington Hampshire, MD;  Location: [facility] ORS;                Service: Cardiovascular;  Laterality: N/A; No date: TVAR  BMI    Body Mass Index: 37.28 kg/m      Reproductive/Obstetrics negative OB ROS                            Anesthesia Physical  Anesthesia Plan  ASA: 3  Anesthesia Plan: General   Post-op Pain Management:    Induction: Intravenous  PONV Risk Score and Plan: Ondansetron, Propofol infusion and TIVA  Airway Management Planned: Mask  Additional Equipment:   Intra-op Plan:   Post-operative Plan: Extubation in OR  Informed Consent: I have reviewed the patients History and Physical, chart, labs and discussed the procedure including the risks, benefits and alternatives for the proposed anesthesia with the patient or authorized representative who has indicated his/her understanding and acceptance.     Dental Advisory Given  Plan Discussed with: Anesthesiologist, CRNA and Surgeon  Anesthesia Plan Comments: (Patient consented for risks of anesthesia including but not limited to:  - adverse reactions to medications - damage to eyes, teeth,  lips or other oral mucosa - nerve damage due to positioning  - sore throat or hoarseness - Damage to heart, brain, nerves, lungs, other parts of body or loss of life  Patient voiced understanding.)       Anesthesia Quick Evaluation

## 2021-08-21 NOTE — Anesthesia Postprocedure Evaluation (Signed)
Anesthesia Post Note  Patient: Jacob Montes  Procedure(s) Performed: UPPER ENDOSCOPIC ULTRASOUND (EUS) LINEAR  Patient location during evaluation: Endoscopy Anesthesia Type: General Level of consciousness: awake and alert Pain management: pain level controlled Vital Signs Assessment: post-procedure vital signs reviewed and stable Respiratory status: spontaneous breathing, nonlabored ventilation, respiratory function stable and patient connected to nasal cannula oxygen Cardiovascular status: blood pressure returned to baseline and stable Postop Assessment: no apparent nausea or vomiting Anesthetic complications: no   No notable events documented.   Last Vitals:  Vitals:   08/21/21 1245 08/21/21 1410  BP: 139/62 (!) 128/54  Pulse: 64 (!) 56  Resp: 18 17  Temp: (!) 36.2 C (!) 35.9 C  SpO2: 98% 97%    Last Pain:  Vitals:   08/21/21 1410  TempSrc: Temporal  PainSc:                  Dimas Millin

## 2021-08-21 NOTE — Telephone Encounter (Signed)
Called patient to let him know that his follow up US showed a possible cm abscess- He is totally asymptomatic after completing 4 weeks IV for liver phlegmon He underwent EUS today and the left lobe of the liver was visualized and was normal So will just observe for development of any symptoms Let him know to inform if any pain abdomen or fever Will Discuss with Dr.Wohl

## 2021-08-21 NOTE — Op Note (Signed)
[facility] Gastroenterology Patient Name: Jacob Montes Procedure Date: 08/21/2021 1:34 PM MRN: [id number] Account #: [id number] Date of Birth: 1941/01/06 Admit Type: Outpatient Age: 81 Room: [facility] ENDO ROOM 3 Gender: Male Note Status: Finalized Instrument Name: [employer]e Cabal Endoscope [id number] [id number] Procedure:             Upper EUS Indications:           Abnormal abdominal MRI: report of a gastric fundus                         nodule (stable in size when compared to imaging                         studies dating back to 2020 per radiology) Patient Profile:       Refer to note in patient chart for documentation of                         history and physical. Providers:             Murray Hodgkins. [place] Referring MD:          Janine Ores. Rosanna Randy, MD (Referring MD), [name] MD,                         MD (Referring MD) Medicines:             Propofol per Anesthesia Complications:         No immediate complications. Procedure:             Pre-Anesthesia Assessment:                        Prior to the procedure, a History and Physical was                         performed, and patient medications and allergies were                         reviewed. The patient is competent. The risks and                         benefits of the procedure and the sedation options and                         risks were discussed with the patient. All questions                         were answered and informed consent was obtained.                         Patient identification and proposed procedure were                         verified by the physician, the nurse and the                         anesthesiologist in the pre-procedure area. Mental                         Status  Examination: alert and oriented. Airway                         Examination: normal oropharyngeal airway and neck                         mobility. Respiratory Examination: clear to                          auscultation. CV Examination: normal. Prophylactic                         Antibiotics: The patient does not require prophylactic                         antibiotics. Prior Anticoagulants: The patient has                         taken no previous anticoagulant or antiplatelet                         agents. ASA Grade Assessment: III - A patient with                         severe systemic disease. After reviewing the risks and                         benefits, the patient was deemed in satisfactory                         condition to undergo the procedure. The anesthesia                         plan was to use monitored anesthesia care (MAC).                         Immediately prior to administration of medications,                         the patient was re-assessed for adequacy to receive                         sedatives. The heart rate, respiratory rate, oxygen                         saturations, blood pressure, adequacy of pulmonary                         ventilation, and response to care were monitored                         throughout the procedure. The physical status of the                         patient was re-assessed after the procedure.                        After obtaining informed consent, the endoscope was  passed under direct vision. Throughout the procedure,                         the patient's blood pressure, pulse, and oxygen                         saturations were monitored continuously. The Endoscope                         was introduced through the mouth, and advanced to the                         second part of duodenum. The Endoscope was introduced                         through the mouth, and advanced to the stomach for                         ultrasound examination from the esophagus and stomach.                         The upper EUS was accomplished without difficulty. The                         patient tolerated the procedure  well. Findings:      ENDOSCOPIC FINDING: :      The examined esophagus was endoscopically normal.      A single small submucosal nodule was found in the gastric fundus.      The exam of the stomach was otherwise normal.      The examined duodenum was endoscopically normal.      ENDOSONOGRAPHIC FINDING: :      An oval intramural (subepithelial) lesion was found in the fundus of the       stomach. The lesion was hypoechoic. Sonographically, the lesion appeared       to originate from the muscularis propria (Layer 4). The lesion measured       17 mm by 12 mm in diameter. The outer endosonographic borders were well       defined.      No perigastric lymphadenopathy seen.      Endosonographic imaging in the left lobe of the liver showed no       abnormalities. Impression:            EGD Impressions:                        - Normal esophagus.                        - A single small submucosal nodule was found in the                         stomach.                        - Normal examined duodenum.                        EUS Impressions:                        -  An intramural (subepithelial) lesion was found in                         the fundus of the stomach. The lesion appeared to                         originate from within the muscularis propria (Layer                         4). Tissue has not been obtained. 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 FNA was not performed                         given benign appearance and stability in size dating                         back to imaging scans from 2020.                        - No perigastric lymphadenopathy visualized.                        - Normal visualized portions of the liver.                        - No specimens collected. Recommendation:        - Discharge patient to home (ambulatory).                        - Repeat  the upper endoscopic ultrasound in 1 year for                         surveillance.                        - The findings and recommendations were discussed with                         the patient.                        - Return to referring physician as previously                         scheduled. Procedure Code(s):     --- Professional ---                        [phone], Esophagogastroduodenoscopy, flexible,                         transoral; with endoscopic ultrasound examination                         limited to the esophagus, stomach or duodenum, and  adjacent structures Diagnosis Code(s):     --- Professional ---                        R93.5, Abnormal findings on diagnostic imaging of                         other abdominal regions, including retroperitoneum                        K31.89, Other diseases of stomach and duodenum CPT copyright 2019 American Medical Association. All rights reserved. The codes documented in this report are preliminary and upon coder review may  be revised to meet current compliance requirements. Attending Participation:      I personally performed the entire procedure without the assistance of a       fellow, resident or surgical assistant. [place],  08/21/2021 2:10:33 PM This report has been signed electronically. Number of Addenda: 0 Note Initiated On: 08/21/2021 1:34 PM Estimated Blood Loss:  Estimated blood loss: none.      [facility]

## 2021-08-21 NOTE — Discharge Instructions (Signed)
Discharge home.

## 2021-08-22 ENCOUNTER — Encounter: Payer: Self-pay | Admitting: Internal Medicine

## 2021-09-03 ENCOUNTER — Ambulatory Visit: Payer: Medicare Other | Admitting: Dermatology

## 2021-09-03 DIAGNOSIS — L578 Other skin changes due to chronic exposure to nonionizing radiation: Secondary | ICD-10-CM | POA: Diagnosis not present

## 2021-09-03 DIAGNOSIS — L570 Actinic keratosis: Secondary | ICD-10-CM | POA: Diagnosis not present

## 2021-09-03 DIAGNOSIS — L820 Inflamed seborrheic keratosis: Secondary | ICD-10-CM

## 2021-09-03 MED ORDER — FLUOROURACIL 5 % EX CREA: TOPICAL_CREAM | Freq: Two times a day (BID) | CUTANEOUS | 1 refills | Status: DC

## 2021-09-03 NOTE — Progress Notes (Signed)
Follow-Up Visit   Subjective  Jacob Montes is a 81 y.o. male who presents for the following: Actinic Keratosis (6 month follow up - face, scalp, ears treated with LN2). The patient has spots, moles and lesions to be evaluated, some may be new or changing and the patient has concerns that these could be cancer.  The following portions of the chart were reviewed this encounter and updated as appropriate:   Tobacco  Allergies  Meds  Problems  Med Hx  Surg Hx  Fam Hx     Review of Systems:  No other skin or systemic complaints except as noted in HPI or Assessment and Plan.  Objective  Well appearing patient in no apparent distress; mood and affect are within normal limits.  A focused examination was performed including scalp, face, arms, hands. Relevant physical exam findings are noted in the Assessment and Plan.  Scalp/face (9) Erythematous thin papules/macules with gritty scale.   Left Forehead x 1, left sideburn x 2, left hand x 2 (5) Erythematous stuck-on, waxy papule or plaque   Assessment & Plan  AK (actinic keratosis) (9) Scalp/face  Actinic Damage - Severe, confluent actinic changes with pre-cancerous actinic keratoses  - Severe, chronic, not at goal, secondary to cumulative UV radiation exposure over time - diffuse scaly erythematous macules and papules with underlying dyspigmentation - Discussed Prescription "Field Treatment" for Severe, Chronic Confluent Actinic Changes with Pre-Cancerous Actinic Keratoses Field treatment involves treatment of an entire area of skin that has confluent Actinic Changes (Sun/ Ultraviolet light damage) and PreCancerous Actinic Keratoses by method of PhotoDynamic Therapy (PDT) and/or prescription Topical Chemotherapy agents such as 5-fluorouracil, 5-fluorouracil/calcipotriene, and/or imiquimod.  The purpose is to decrease the number of clinically evident and subclinical PreCancerous lesions to prevent progression to development of skin  cancer by chemically destroying early precancer changes that may or may not be visible.  It has been shown to reduce the risk of developing skin cancer in the treated area. As a result of treatment, redness, scaling, crusting, and open sores may occur during treatment course. One or more than one of these methods may be used and may have to be used several times to control, suppress and eliminate the PreCancerous changes. Discussed treatment course, expected reaction, and possible side effects. - Recommend daily broad spectrum sunscreen SPF 30+ to sun-exposed areas, reapply every 2 hours as needed.  - Staying in the shade or wearing long sleeves, sun glasses (UVA+UVB protection) and wide brim hats (4-inch brim around the entire circumference of the hat) are also recommended. - Call for new or changing lesions.  fluorouracil (EFUDEX) 5 % cream - Scalp/face Apply topically 2 (two) times daily. Apply twice daily to scalp and forehead for 7 days  Destruction of lesion - Scalp/face Complexity: simple   Destruction method: cryotherapy   Informed consent: discussed and consent obtained   Timeout:  patient name, date of birth, surgical site, and procedure verified Lesion destroyed using liquid nitrogen: Yes   Region frozen until ice ball extended beyond lesion: Yes   Outcome: patient tolerated procedure well with no complications   Post-procedure details: wound care instructions given    Start fluorouracil 5%/Calcipotriene cream bid to scalp and forehead  x 7 days  fluorouracil (EFUDEX) 5 % cream - Scalp/face Apply topically 2 (two) times daily. Apply twice daily to scalp and forehead for 7 days  Inflamed seborrheic keratosis (5) Left Forehead x 1, left sideburn x 2, left hand x 2 Symptomatic, irritating, patient  would like treated. Destruction of lesion - Left Forehead x 1, left sideburn x 2, left hand x 2 Complexity: simple   Destruction method: cryotherapy   Informed consent: discussed and  consent obtained   Timeout:  patient name, date of birth, surgical site, and procedure verified Lesion destroyed using liquid nitrogen: Yes   Region frozen until ice ball extended beyond lesion: Yes   Outcome: patient tolerated procedure well with no complications   Post-procedure details: wound care instructions given    Actinic Damage - chronic, secondary to cumulative UV radiation exposure/sun exposure over time - diffuse scaly erythematous macules with underlying dyspigmentation - Recommend daily broad spectrum sunscreen SPF 30+ to sun-exposed areas, reapply every 2 hours as needed.  - Recommend staying in the shade or wearing long sleeves, sun glasses (UVA+UVB protection) and wide brim hats (4-inch brim around the entire circumference of the hat). - Call for new or changing lesions.  Return in about 6 months (around 03/06/2022) for AK follow up.  I, Ashok Cordia, CMA, am acting as scribe for Sarina Ser, MD . Documentation: I have reviewed the above documentation for accuracy and completeness, and I agree with the above.  Sarina Ser, MD

## 2021-09-03 NOTE — Patient Instructions (Signed)
Start fluorouracil 5%/Calcipotriene cream twice daily to scalp and forehead  x 7 days   Due to recent changes in healthcare laws, you may see results of your pathology and/or laboratory studies on MyChart before the doctors have had a chance to review them. We understand that in some cases there may be results that are confusing or concerning to you. Please understand that not all results are received at the same time and often the doctors may need to interpret multiple results in order to provide you with the best plan of care or course of treatment. Therefore, we ask that you please give [place] 2 business days to thoroughly review all your results before contacting the office for clarification. Should we see a critical lab result, you will be contacted sooner.   If You Need Anything After Your Visit  If you have any questions or concerns for your doctor, please call our main line at [phone] and press option 4 to reach your doctor's medical assistant. If no one answers, please leave a voicemail as directed and we will return your call as soon as possible. Messages left after 4 pm will be answered the following business day.   You may also send [place] a message via [place]. We typically respond to MyChart messages within 1-2 business days.  For prescription refills, please ask your pharmacy to contact our office. Our fax number is [phone].  If you have an urgent issue when the clinic is closed that cannot wait until the next business day, you can page your doctor at the number below.    Please note that while we do our best to be available for urgent issues outside of office hours, we are not available 24/7.   If you have an urgent issue and are unable to reach [place], you may choose to seek medical care at your doctor's office, retail clinic, urgent care center, or emergency room.  If you have a medical emergency, please immediately call 911 or go to the emergency department.  Pager  Numbers  - Dr. Nehemiah Massed: [phone]  - Dr. Laurence Ferrari: [phone]  - Dr. Nicole Kindred: [phone]  In the event of inclement weather, please call our main line at [phone] for an update on the status of any delays or closures.  Dermatology Medication Tips: Please keep the boxes that topical medications come in in order to help keep track of the instructions about where and how to use these. Pharmacies typically print the medication instructions only on the boxes and not directly on the medication tubes.   If your medication is too expensive, please contact our office at [phone] option 4 or send [place] a message through [place].   We are unable to tell what your co-pay for medications will be in advance as this is different depending on your insurance coverage. However, we may be able to find a substitute medication at lower cost or fill out paperwork to get insurance to cover a needed medication.   If a prior authorization is required to get your medication covered by your insurance company, please allow [place] 1-2 business days to complete this process.  Drug prices often vary depending on where the prescription is filled and some pharmacies may offer cheaper prices.  The website www.goodrx.com contains coupons for medications through different pharmacies. The prices here do not account for what the cost may be with help from insurance (it may be cheaper with your insurance), but the website can give you the price if you did  not use any insurance.  - You can print the associated coupon and take it with your prescription to the pharmacy.  - You may also stop by our office during regular business hours and pick up a GoodRx coupon card.  - If you need your prescription sent electronically to a different pharmacy, notify our office through [facility] or by phone at [phone] option 4.     Si Usted Necesita Algo Despus de Su Visita  Tambin puede enviarnos un mensaje a travs de  Pharmacist, community. Por lo general respondemos a los mensajes de MyChart en el transcurso de 1 a 2 das hbiles.  Para renovar recetas, por favor pida a su farmacia que se ponga en contacto con nuestra oficina. Har[place] Dingwall de fax es [place] [phone].  Si tiene un asunto urgente cuando la clnica est cerrada y que no puede esperar hasta el siguiente da hbil, puede llamar/localizar a su doctor(a) al nmero que aparece a continuacin.   Por favor, tenga en cuenta que aunque hacemos todo lo posible para estar disponibles para asuntos urgentes fuera del horario de [place], no estamos disponibles las 24 horas del da, los 7 das de la [place].   Si tiene un problema urgente y no puede comunicarse con nosotros, puede optar por buscar atencin mdica  en el consultorio de su doctor(a), en una clnica privada, en un centro de atencin urgente o en una sala de emergencias.  Si tiene Engineering geologist, por favor llame inmediatamente al 911 o vaya a la sala de emergencias.  Nmeros de bper  - Dr. Nehemiah Massed: [phone]  - Dra. Moye: [phone]  - Dra. Nicole Kindred: [phone]  En caso de inclemencias del [place], por favor llame a Johnsie Kindred principal al [phone] para una actualizacin sobre el [place] de cualquier retraso o cierre.  Consejos para la medicacin en dermatologa: Por favor, guarde las cajas en las que vienen los medicamentos de uso tpico para ayudarle a seguir las instrucciones sobre dnde y cmo usarlos. Las farmacias generalmente imprimen las instrucciones del medicamento slo en las cajas y no directamente en los tubos del [place].   Si su medicamento es muy caro, por favor, pngase en contacto con Zigmund Daniel llamando al [phone] y presione la opcin 4 o envenos un mensaje a travs de Pharmacist, community.   No podemos decirle cul ser su copago por los medicamentos por adelantado ya que esto es diferente dependiendo de la cobertura de su seguro. Sin embargo, es posible que  podamos encontrar un medicamento sustituto a Electrical engineer un formulario para que el seguro cubra el medicamento que se considera necesario.   Si se requiere una autorizacin previa para que su compaa de seguros [place] su medicamento, por favor permtanos de 1 a 2 das hbiles para completar este proceso.  Los precios de los medicamentos varan con frecuencia dependiendo del Environmental consultant de dnde se surte la receta y alguna farmacias pueden ofrecer precios ms baratos.  El sitio web www.goodrx.com tiene cupones para medicamentos de Airline pilot. Los precios aqu no tienen en cuenta lo que podra costar con la ayuda del seguro (puede ser ms barato con su seguro), pero el sitio web puede darle el precio si no utiliz Research scientist (physical sciences).  - Puede imprimir el cupn correspondiente y llevarlo con su receta a la farmacia.  - Tambin puede pasar por nuestra oficina durante el horario de atencin regular y Charity fundraiser una tarjeta de cupones de GoodRx.  - Si necesita que su receta se enve  electrnicamente a [place] diferente, informe a nuestra oficina a travs de MyChart de [place] o por telfono llamando al [phone] y presione la opcin 4.

## 2021-09-07 ENCOUNTER — Encounter: Payer: Self-pay | Admitting: Dermatology

## 2021-09-23 ENCOUNTER — Other Ambulatory Visit: Payer: Self-pay | Admitting: Family Medicine

## 2021-10-02 ENCOUNTER — Encounter: Payer: Medicare Other | Admitting: Family Medicine

## 2021-10-06 ENCOUNTER — Other Ambulatory Visit: Payer: Self-pay | Admitting: Family Medicine

## 2021-10-06 MED ORDER — AMOXICILLIN-POT CLAVULANATE 875-125 MG PO TABS: 1.0000 | ORAL_TABLET | Freq: Two times a day (BID) | ORAL | 0 refills | Status: DC

## 2021-10-07 ENCOUNTER — Ambulatory Visit
Admission: RE | Admit: 2021-10-07 | Discharge: 2021-10-07 | Disposition: A | Discharge status: Home / Self Care | Payer: Medicare Other | Source: Ambulatory Visit | Attending: Family Medicine | Admitting: Family Medicine

## 2021-10-07 ENCOUNTER — Encounter: Payer: Self-pay | Admitting: Family Medicine

## 2021-10-07 ENCOUNTER — Ambulatory Visit
Admission: RE | Admit: 2021-10-07 | Discharge: 2021-10-07 | Disposition: A | Discharge status: Home / Self Care | Payer: Medicare Other | Attending: Family Medicine | Admitting: Family Medicine

## 2021-10-07 ENCOUNTER — Ambulatory Visit (INDEPENDENT_AMBULATORY_CARE_PROVIDER_SITE_OTHER): Payer: Medicare Other | Admitting: Family Medicine

## 2021-10-07 VITALS — BP 143/52 | HR 72 | Temp 98.2°F | Resp 16 | Ht 66.0 in | Wt 231.0 lb

## 2021-10-07 DIAGNOSIS — E1169 Type 2 diabetes mellitus with other specified complication: Secondary | ICD-10-CM | POA: Diagnosis not present

## 2021-10-07 DIAGNOSIS — R509 Fever, unspecified: Secondary | ICD-10-CM

## 2021-10-07 DIAGNOSIS — I25708 Atherosclerosis of coronary artery bypass graft(s), unspecified, with other forms of angina pectoris: Secondary | ICD-10-CM | POA: Diagnosis not present

## 2021-10-07 DIAGNOSIS — E785 Hyperlipidemia, unspecified: Secondary | ICD-10-CM

## 2021-10-07 DIAGNOSIS — Z952 Presence of prosthetic heart valve: Secondary | ICD-10-CM

## 2021-10-07 DIAGNOSIS — H40003 Preglaucoma, unspecified, bilateral: Secondary | ICD-10-CM | POA: Diagnosis not present

## 2021-10-07 DIAGNOSIS — E1165 Type 2 diabetes mellitus with hyperglycemia: Secondary | ICD-10-CM

## 2021-10-07 DIAGNOSIS — K819 Cholecystitis, unspecified: Secondary | ICD-10-CM

## 2021-10-07 NOTE — Progress Notes (Unsigned)
      I,Jacob Montes,acting as a scribe for [name], MD.,have documented all relevant documentation on the behalf of [name], MD,as directed by  [name], MD while in the presence of [name], MD.  Established patient visit   Patient: Jacob Montes   DOB: September 18, 1940   81 y.o. Male  MRN: [id number] Visit Date: 10/07/2021  Today's healthcare provider: [name], MD   Chief Complaint  Patient presents with   Fever    Patient complains of fever up to 102 starting yesterday. No other symptoms.    Subjective    HPI HPI     Fever    Additional comments: Patient complains of fever up to 102 starting yesterday. No other symptoms.       Last edited by Smitty Knudsen, CMA on 10/07/2021  8:05 AM.      ***  Medications: Outpatient Medications Prior to Visit  Medication Sig   amLODipine (NORVASC) 10 MG tablet Take 1 tablet by mouth once daily   amoxicillin-clavulanate (AUGMENTIN) 875-125 MG tablet Take 1 tablet by mouth 2 (two) times daily.   aspirin 81 MG chewable tablet Chew 81 mg by mouth daily.   docusate sodium (COLACE) 250 MG capsule Take 250 mg by mouth daily as needed for constipation.    fluorouracil (EFUDEX) 5 % cream Apply topically 2 (two) times daily. Apply twice daily to scalp and forehead for 7 days   furosemide (LASIX) 40 MG tablet Take 1 tablet (40 mg) by mouth daily 5 days per week   montelukast (SINGULAIR) 10 MG tablet Take 1 tablet (10 mg total) by mouth at bedtime as needed (allergies).   Multiple Vitamin (MULTIVITAMIN) tablet Take 1 tablet by mouth daily.   omeprazole (PRILOSEC) 20 MG capsule Take 20 mg by mouth daily. As needed   potassium chloride (KLOR-CON) 10 MEQ tablet Take 1 tablet (10 mEq) 5 days per week. Take with lasix   rosuvastatin (CRESTOR) 20 MG tablet Take 1 tablet by mouth once daily   tamsulosin (FLOMAX) 0.4 MG CAPS capsule Take 1 capsule by mouth once daily   No facility-administered  medications prior to visit.    Review of Systems  {Labs  Heme  Chem  Endocrine  Serology  Results Review (optional):23779}   Objective    BP (!) 143/52 (BP Location: Right Arm, Patient Position: Sitting, Cuff Size: Normal)   Pulse 72   Temp 98.2 F (36.8 C) (Oral)   Resp 16   Ht '5\' 6"'$  (1.676 m)   Wt 231 lb (104.8 kg)   SpO2 97%   BMI 37.28 kg/m  {Show previous vital signs (optional):23777}  Physical Exam  ***  No results found for any visits on 10/07/21.  Assessment & Plan     ***  No follow-ups on file.      {provider attestation***:1}   [name], MD  [facility] [phone] (phone) [phone] (fax)  [place]

## 2021-10-08 LAB — LIPID PANEL
Chol/HDL Ratio: 3.1 ratio (ref 0.0–5.0)
Cholesterol, Total: 133 mg/dL (ref 100–199)
HDL: 43 mg/dL (ref >39)
LDL Chol Calc (NIH): 67 mg/dL (ref 0–99)
Triglycerides: 133 mg/dL (ref 0–149)
VLDL Cholesterol Cal: 23 mg/dL (ref 5–40)

## 2021-10-08 LAB — COMPREHENSIVE METABOLIC PANEL
ALT: 25 IU/L (ref 0–44)
AST: 30 IU/L (ref 0–40)
Albumin/Globulin Ratio: 1.8 (ref 1.2–2.2)
Albumin: 4 g/dL (ref 3.8–4.8)
Alkaline Phosphatase: 56 IU/L (ref 44–121)
BUN/Creatinine Ratio: 22 (ref 10–24)
BUN: 18 mg/dL (ref 8–27)
Bilirubin Total: 1 mg/dL (ref 0.0–1.2)
CO2: 24 mmol/L (ref 20–29)
Calcium: 9.3 mg/dL (ref 8.6–10.2)
Chloride: 103 mmol/L (ref 96–106)
Creatinine, Ser: 0.83 mg/dL (ref 0.76–1.27)
Globulin, Total: 2.2 g/dL (ref 1.5–4.5)
Glucose: 102 mg/dL — ABNORMAL HIGH (ref 70–99)
Potassium: 4.7 mmol/L (ref 3.5–5.2)
Sodium: 141 mmol/L (ref 134–144)
Total Protein: 6.2 g/dL (ref 6.0–8.5)
eGFR: 88 mL/min/{1.73_m2} (ref >59)

## 2021-10-08 LAB — CBC
Hematocrit: 40 % (ref 37.5–51.0)
Hemoglobin: 14.2 g/dL (ref 13.0–17.7)
MCH: 33 pg (ref 26.6–33.0)
MCHC: 35.5 g/dL (ref 31.5–35.7)
MCV: 93 fL (ref 79–97)
Platelets: 108 10*3/uL — ABNORMAL LOW (ref 150–450)
RBC: 4.3 x10E6/uL (ref 4.14–5.80)
RDW: 13.4 % (ref 11.6–15.4)
WBC: 6 10*3/uL (ref 3.4–10.8)

## 2021-10-08 LAB — HEMOGLOBIN A1C
Est. average glucose Bld gHb Est-mCnc: 123 mg/dL
Hgb A1c MFr Bld: 5.9 % — ABNORMAL HIGH (ref 4.8–5.6)

## 2021-10-09 LAB — URINE CULTURE: Organism ID, Bacteria: NO GROWTH

## 2021-10-14 ENCOUNTER — Ambulatory Visit (INDEPENDENT_AMBULATORY_CARE_PROVIDER_SITE_OTHER): Payer: Medicare Other | Admitting: Family Medicine

## 2021-10-14 ENCOUNTER — Other Ambulatory Visit: Payer: Self-pay | Admitting: *Deleted

## 2021-10-14 ENCOUNTER — Encounter: Payer: Self-pay | Admitting: Family Medicine

## 2021-10-14 VITALS — BP 131/65 | HR 87 | Temp 98.7°F | Resp 16 | Wt 229.0 lb

## 2021-10-14 DIAGNOSIS — K5792 Diverticulitis of intestine, part unspecified, without perforation or abscess without bleeding: Secondary | ICD-10-CM | POA: Diagnosis not present

## 2021-10-14 DIAGNOSIS — R1084 Generalized abdominal pain: Secondary | ICD-10-CM

## 2021-10-14 DIAGNOSIS — R509 Fever, unspecified: Secondary | ICD-10-CM | POA: Diagnosis not present

## 2021-10-14 DIAGNOSIS — K8309 Other cholangitis: Secondary | ICD-10-CM

## 2021-10-14 DIAGNOSIS — N183 Chronic kidney disease, stage 3 unspecified: Secondary | ICD-10-CM | POA: Diagnosis not present

## 2021-10-14 LAB — POCT URINALYSIS DIPSTICK
Bilirubin, UA: NEGATIVE
Blood, UA: NEGATIVE
Glucose, UA: NEGATIVE
Ketones, UA: POSITIVE
Leukocytes, UA: NEGATIVE
Nitrite, UA: NEGATIVE
Protein, UA: POSITIVE — AB
Spec Grav, UA: 1.02 (ref 1.010–1.025)
Urobilinogen, UA: 0.2 E.U./dL
pH, UA: 6 (ref 5.0–8.0)

## 2021-10-14 MED ORDER — CIPROFLOXACIN HCL 500 MG PO TABS: 500.0000 mg | ORAL_TABLET | Freq: Two times a day (BID) | ORAL | 1 refills | Status: DC

## 2021-10-14 MED ORDER — METRONIDAZOLE 500 MG PO TABS: 500.0000 mg | ORAL_TABLET | Freq: Three times a day (TID) | ORAL | 1 refills | Status: DC

## 2021-10-14 NOTE — Progress Notes (Unsigned)
Established patient visit  I,[name],acting as a scribe for [name], MD.,have documented all relevant documentation on the behalf of [name], MD,as directed by  [name], MD while in the presence of [name], MD.   Patient: Jacob Montes   DOB: January 16, 1941   80 y.o. Male  MRN: [id number] Visit Date: 10/14/2021  Today's healthcare provider: [name], MD   Chief Complaint  Patient presents with   Abdominal Pain   Fever   Subjective    HPI  Patient is still having fever and low abdominal pain.   Medications: Outpatient Medications Prior to Visit  Medication Sig   amLODipine (NORVASC) 10 MG tablet Take 1 tablet by mouth once daily   amoxicillin-clavulanate (AUGMENTIN) 875-125 MG tablet Take 1 tablet by mouth 2 (two) times daily.   aspirin 81 MG chewable tablet Chew 81 mg by mouth daily.   docusate sodium (COLACE) 250 MG capsule Take 250 mg by mouth daily as needed for constipation.    fluorouracil (EFUDEX) 5 % cream Apply topically 2 (two) times daily. Apply twice daily to scalp and forehead for 7 days   furosemide (LASIX) 40 MG tablet Take 1 tablet (40 mg) by mouth daily 5 days per week   montelukast (SINGULAIR) 10 MG tablet Take 1 tablet (10 mg total) by mouth at bedtime as needed (allergies).   Multiple Vitamin (MULTIVITAMIN) tablet Take 1 tablet by mouth daily.   omeprazole (PRILOSEC) 20 MG capsule Take 20 mg by mouth daily. As needed   potassium chloride (KLOR-CON) 10 MEQ tablet Take 1 tablet (10 mEq) 5 days per week. Take with lasix   rosuvastatin (CRESTOR) 20 MG tablet Take 1 tablet by mouth once daily   tamsulosin (FLOMAX) 0.4 MG CAPS capsule Take 1 capsule by mouth once daily   No facility-administered medications prior to visit.    Review of Systems  {Labs  Heme  Chem  Endocrine  Serology  Results Review (optional):23779}   Objective    BP 131/65 (BP Location: Right Arm, Patient Position: Sitting, Cuff  Size: Large)   Pulse 87   Temp 98.7 F (37.1 C) (Oral)   Resp 16   Wt 229 lb (103.9 kg)   SpO2 96%   BMI 36.96 kg/m  {Show previous vital signs (optional):23777}  Physical Exam  ***  No results found for any visits on 10/14/21.  Assessment & Plan     1. Generalized abdominal pain  - CBC w/Diff/Platelet - Comprehensive Metabolic Panel (CMET) - Lipase  2. Acute cholangitis  - Comprehensive Metabolic Panel (CMET) - Lipase - Ambulatory referral to Gastroenterology - POCT urinalysis dipstick - CULTURE, URINE COMPREHENSIVE - metroNIDAZOLE (FLAGYL) 500 MG tablet; Take 1 tablet (500 mg total) by mouth 3 (three) times daily.  Dispense: 21 tablet; Refill: 1 - ciprofloxacin (CIPRO) 500 MG tablet; Take 1 tablet (500 mg total) by mouth 2 (two) times daily.  Dispense: 14 tablet; Refill: 1  3. Fever, unspecified fever cause  - CBC w/Diff/Platelet  4. Diverticulitis  - Ambulatory referral to Gastroenterology - POCT urinalysis dipstick - CULTURE, URINE COMPREHENSIVE - metroNIDAZOLE (FLAGYL) 500 MG tablet; Take 1 tablet (500 mg total) by mouth 3 (three) times daily.  Dispense: 21 tablet; Refill: 1 - ciprofloxacin (CIPRO) 500 MG tablet; Take 1 tablet (500 mg total) by mouth 2 (two) times daily.  Dispense: 14 tablet; Refill: 1  5. Stage 3 chronic kidney disease, unspecified whether stage 3a or 3b  CKD ([place])  - Comprehensive Metabolic Panel (CMET)   No follow-ups on file.      {provider attestation***:1}   [name], MD  [facility] [phone] (phone) [phone] (fax)  [place]

## 2021-10-15 LAB — COMPREHENSIVE METABOLIC PANEL
ALT: 15 IU/L (ref 0–44)
AST: 25 IU/L (ref 0–40)
Albumin/Globulin Ratio: 1.7 (ref 1.2–2.2)
Albumin: 4 g/dL (ref 3.8–4.8)
Alkaline Phosphatase: 62 IU/L (ref 44–121)
BUN/Creatinine Ratio: 16 (ref 10–24)
BUN: 12 mg/dL (ref 8–27)
Bilirubin Total: 1 mg/dL (ref 0.0–1.2)
CO2: 23 mmol/L (ref 20–29)
Calcium: 9 mg/dL (ref 8.6–10.2)
Chloride: 100 mmol/L (ref 96–106)
Creatinine, Ser: 0.75 mg/dL — ABNORMAL LOW (ref 0.76–1.27)
Globulin, Total: 2.4 g/dL (ref 1.5–4.5)
Glucose: 99 mg/dL (ref 70–99)
Potassium: 4.4 mmol/L (ref 3.5–5.2)
Sodium: 137 mmol/L (ref 134–144)
Total Protein: 6.4 g/dL (ref 6.0–8.5)
eGFR: 91 mL/min/{1.73_m2} (ref >59)

## 2021-10-15 LAB — CBC WITH DIFFERENTIAL/PLATELET
Basophils Absolute: 0 10*3/uL (ref 0.0–0.2)
Basos: 0 %
EOS (ABSOLUTE): 0 10*3/uL (ref 0.0–0.4)
Eos: 0 %
Hematocrit: 38.6 % (ref 37.5–51.0)
Hemoglobin: 13.4 g/dL (ref 13.0–17.7)
Immature Grans (Abs): 0 10*3/uL (ref 0.0–0.1)
Immature Granulocytes: 0 %
Lymphocytes Absolute: 2.5 10*3/uL (ref 0.7–3.1)
Lymphs: 20 %
MCH: 32.2 pg (ref 26.6–33.0)
MCHC: 34.7 g/dL (ref 31.5–35.7)
MCV: 93 fL (ref 79–97)
Monocytes Absolute: 1.1 10*3/uL — ABNORMAL HIGH (ref 0.1–0.9)
Monocytes: 9 %
Neutrophils Absolute: 8.6 10*3/uL — ABNORMAL HIGH (ref 1.4–7.0)
Neutrophils: 71 %
Platelets: 169 10*3/uL (ref 150–450)
RBC: 4.16 x10E6/uL (ref 4.14–5.80)
RDW: 13.4 % (ref 11.6–15.4)
WBC: 12.3 10*3/uL — ABNORMAL HIGH (ref 3.4–10.8)

## 2021-10-15 LAB — LIPASE: Lipase: 7 U/L — ABNORMAL LOW (ref 13–78)

## 2021-10-16 ENCOUNTER — Ambulatory Visit (INDEPENDENT_AMBULATORY_CARE_PROVIDER_SITE_OTHER): Payer: Medicare Other | Admitting: Family Medicine

## 2021-10-16 ENCOUNTER — Encounter: Payer: Self-pay | Admitting: Family Medicine

## 2021-10-16 VITALS — BP 125/46 | HR 73 | Resp 16 | Ht 66.0 in | Wt 229.0 lb

## 2021-10-16 DIAGNOSIS — G8929 Other chronic pain: Secondary | ICD-10-CM

## 2021-10-16 DIAGNOSIS — R1084 Generalized abdominal pain: Secondary | ICD-10-CM

## 2021-10-16 DIAGNOSIS — K5792 Diverticulitis of intestine, part unspecified, without perforation or abscess without bleeding: Secondary | ICD-10-CM | POA: Diagnosis not present

## 2021-10-16 DIAGNOSIS — K750 Abscess of liver: Secondary | ICD-10-CM

## 2021-10-16 DIAGNOSIS — Z0000 Encounter for general adult medical examination without abnormal findings: Secondary | ICD-10-CM

## 2021-10-16 DIAGNOSIS — R109 Unspecified abdominal pain: Secondary | ICD-10-CM | POA: Diagnosis not present

## 2021-10-16 DIAGNOSIS — K8309 Other cholangitis: Secondary | ICD-10-CM

## 2021-10-16 DIAGNOSIS — E1165 Type 2 diabetes mellitus with hyperglycemia: Secondary | ICD-10-CM | POA: Diagnosis not present

## 2021-10-16 DIAGNOSIS — Z23 Encounter for immunization: Secondary | ICD-10-CM

## 2021-10-16 LAB — CULTURE, URINE COMPREHENSIVE

## 2021-10-16 NOTE — Progress Notes (Signed)
Complete physical exam  I,Jacob Montes,acting as a scribe for Jacob Durie, MD.,have documented all relevant documentation on the behalf of Jacob Durie, MD,as directed by  Jacob Durie, MD while in the presence of Jacob Durie, MD.   Patient: Jacob Montes   DOB: February 21, 1940   81 y.o. Male  MRN: [id number] Visit Date: 10/16/2021  Today's healthcare provider: [name], MD   Chief Complaint  Patient presents with   Medicare Wellness   Subjective    Jacob Montes is a 81 y.o. male who presents today for a complete physical exam.  Patient is feeling better from a febrile illness that started on August 28.  His Tmax was 99 continues to have some diarrhea and dates that overall his abdominal pain is much less.  Overall he is feeling better. Patient had AWV with [facility] on 06/17/2021.   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 s/p 3-v CABG in 07/2007   Carotid artery disease (Jacob Montes)    a. 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   GERD (gastroesophageal reflux disease)    HLD (hyperlipidemia)    Hyperlipidemia 10/15/2015   Hypertension    Nephrolithiasis 11/16/2018   Pulmonary embolism (Jacob Montes) 08/2010   Skin cancer    Sleep apnea    Stroke [facility])    Past Surgical History:  Procedure Laterality Date   Carotid surgery Left 03/08/2007   CHOLECYSTECTOMY  02/16/2018   ENDOSCOPIC RETROGRADE CHOLANGIOPANCREATOGRAPHY (ERCP) WITH PROPOFOL N/A 07/10/2021   Procedure: ENDOSCOPIC RETROGRADE CHOLANGIOPANCREATOGRAPHY (ERCP) WITH PROPOFOL;  Surgeon: Jacob Lame, MD;  Location: [facility] ENDOSCOPY;  Service: Endoscopy;  Laterality: N/A;   ERCP N/A 11/18/2018   Procedure: ENDOSCOPIC RETROGRADE CHOLANGIOPANCREATOGRAPHY (ERCP);  Surgeon: Jacob Lame, MD;  Location: [facility] ENDOSCOPY;  Service: Endoscopy;   Laterality: N/A;   EUS N/A 08/21/2021   Procedure: UPPER ENDOSCOPIC ULTRASOUND (EUS) LINEAR;  Surgeon: Jacob Bodily, MD;  Location: [facility] ENDOSCOPY;  Service: Gastroenterology;  Laterality: N/A;  [facility]   Heart Bypass N/A 07/2007   left shoulder surgery Left 06/03/2009   RIGHT AND LEFT HEART CATH  07/18/2007   right shoulder surgery  2003   TEE WITHOUT CARDIOVERSION N/A 11/21/2018   Procedure: TRANSESOPHAGEAL ECHOCARDIOGRAM (TEE);  Surgeon: Jacob Hampshire, MD;  Location: [facility] ORS;  Service: Cardiovascular;  Laterality: N/A;   TVAR     Social History   Socioeconomic History   Marital status: Married    Spouse name: Not on file   Number of children: 2   Years of education: Not on file   Highest education level: Some college, no degree  Occupational History   Occupation: retired  Tobacco Use   Smoking status: Former    Types: Cigarettes    Quit date: 1979    Years since quitting: 44.7   Smokeless tobacco: Never   Tobacco comments:    Quit in [place] Use: Never used  Substance and Sexual Activity   Alcohol use: Never   Drug use: Never   Sexual activity: Yes    Partners: Female  Other Topics Concern   Not on file  Social History Narrative   Not on file   Social Determinants of Health   Financial Resource Strain:  Low Risk  (06/17/2021)   Overall Financial Resource Strain (CARDIA)    Difficulty of Paying Living Expenses: Not hard at all  Food Insecurity: No Food Insecurity (06/17/2021)   Hunger Vital Sign    Worried About Running Out of Food in the Last Year: Never true    Ran Out of Food in the Last Year: Never true  Transportation Needs: No Transportation Needs (06/17/2021)   PRAPARE - Hydrologist (Medical): No    Lack of Transportation (Non-Medical): No  Physical Activity: Insufficiently Active (06/17/2021)   Exercise Vital Sign    Days of Exercise per Week: 2 days    Minutes of Exercise per Session: 20 min   Stress: No Stress Concern Present (06/17/2021)   [place]    Feeling of Stress : Not at all  Social Connections: Moderately Integrated (06/17/2021)   Social Connection and Isolation Panel [NHANES]    Frequency of Communication with Friends and Family: More than three times a week    Frequency of Social Gatherings with Friends and Family: Once a week    Attends Religious Services: More than 4 times per year    Active Member of [employer] or Organizations: No    Attends Archivist Meetings: Never    Marital Status: Married  Human resources officer Violence: Not At Risk (06/17/2021)   Humiliation, Afraid, Rape, and Kick questionnaire    Fear of Current or Ex-Partner: No    Emotionally Abused: No    Physically Abused: No    Sexually Abused: No   Family Status  Relation Name Status   Mother  Deceased       stroke   Father  Deceased       lung cancer   Brother Buyer, retail Alive       non hodgkins lyphoma   Son  Alive   Family History  Problem Relation Age of Onset   Hypertension Mother    Stroke Mother    Lung cancer Father    Non-Hodgkin's lymphoma Brother    Allergies  Allergen Reactions   Atorvastatin Other (See Comments)    aches   Ramipril Other (See Comments)    cough    Patient Care Team: Jerrol Banana., MD as PCP - General (Family Medicine) [place], [place] (Optometry)   Medications: Outpatient Medications Prior to Visit  Medication Sig   amLODipine (NORVASC) 10 MG tablet Take 1 tablet by mouth once daily   aspirin 81 MG chewable tablet Chew 81 mg by mouth daily.   ciprofloxacin (CIPRO) 500 MG tablet Take 1 tablet (500 mg total) by mouth 2 (two) times daily.   docusate sodium (COLACE) 250 MG capsule Take 250 mg by mouth daily as needed for constipation.    fluorouracil (EFUDEX) 5 % cream Apply topically 2 (two) times daily. Apply twice daily to scalp and forehead for 7 days   furosemide  (LASIX) 40 MG tablet Take 1 tablet (40 mg) by mouth daily 5 days per week   metroNIDAZOLE (FLAGYL) 500 MG tablet Take 1 tablet (500 mg total) by mouth 3 (three) times daily.   montelukast (SINGULAIR) 10 MG tablet Take 1 tablet (10 mg total) by mouth at bedtime as needed (allergies).   Multiple Vitamin (MULTIVITAMIN) tablet Take 1 tablet by mouth daily.   omeprazole (PRILOSEC) 20 MG capsule Take 20 mg by mouth daily. As needed   potassium chloride (KLOR-CON) 10 MEQ tablet  Take 1 tablet (10 mEq) 5 days per week. Take with lasix   rosuvastatin (CRESTOR) 20 MG tablet Take 1 tablet by mouth once daily   tamsulosin (FLOMAX) 0.4 MG CAPS capsule Take 1 capsule by mouth once daily   [DISCONTINUED] amoxicillin-clavulanate (AUGMENTIN) 875-125 MG tablet Take 1 tablet by mouth 2 (two) times daily. (Patient not taking: Reported on 10/16/2021)   No facility-administered medications prior to visit.    Review of Systems  All other systems reviewed and are negative.     Objective     BP (!) 125/46 (BP Location: Left Arm, Patient Position: Sitting, Cuff Size: Normal)   Pulse 73   Resp 16   Ht '5\' 6"'$  (1.676 m)   Wt 229 lb (103.9 kg)   SpO2 94%   BMI 36.96 kg/m     Physical Exam Constitutional:      Appearance: Normal appearance.  HENT:     Head: Normocephalic and atraumatic.     Right Ear: Tympanic membrane, ear canal and external ear normal.     Left Ear: Tympanic membrane, ear canal and external ear normal.     Nose: Nose normal.     Mouth/Throat:     Mouth: Mucous membranes are moist.     Pharynx: Oropharynx is clear.  Eyes:     Extraocular Movements: Extraocular movements intact.     Conjunctiva/sclera: Conjunctivae normal.     Pupils: Pupils are equal, round, and reactive to light.  Cardiovascular:     Rate and Rhythm: Normal rate and regular rhythm.     Pulses: Normal pulses.     Heart sounds: Normal heart sounds.  Pulmonary:     Effort: Pulmonary effort is normal.     Breath  sounds: Normal breath sounds.  Abdominal:     General: Bowel sounds are normal.     Palpations: Abdomen is soft.     Tenderness: There is abdominal tenderness.     Comments: Some suprapubic tenderness surprisingly still present.  No guarding rebound or mass effect.  Musculoskeletal:        General: Normal range of motion.     Cervical back: Normal range of motion and neck supple.  Skin:    General: Skin is warm and dry.  Neurological:     General: No focal deficit present.     Mental Status: He is alert and oriented to person, place, and time. Mental status is at baseline.  Psychiatric:        Mood and Affect: Mood normal.        Behavior: Behavior normal.        Thought Content: Thought content normal.        Judgment: Judgment normal.       Last depression screening scores    08/05/2021    9:40 AM 07/03/2021   10:59 AM 06/17/2021   10:29 AM  PHQ 2/9 Scores  PHQ - 2 Score 0 0 0  PHQ- 9 Score  0 0   Last fall risk screening    08/05/2021    9:40 AM  Fall Risk   Falls in the past year? 0  Follow up Falls evaluation completed   Last Audit-C alcohol use screening    07/03/2021   10:59 AM  Alcohol Use Disorder Test (AUDIT)  1. How often do you have a drink containing alcohol? 0  2. How many drinks containing alcohol do you have on a typical day when you are drinking? 0  3. How  often do you have six or more drinks on one occasion? 0  AUDIT-C Score 0   A score of 3 or more in women, and 4 or more in men indicates increased risk for alcohol abuse, EXCEPT if all of the points are from question 1   No results found for any visits on 10/16/21.  Assessment & Plan    Routine Health Maintenance and Physical Exam  Exercise Activities and Dietary recommendations  Goals      DIET - EAT MORE FRUITS AND VEGETABLES     Exercise 3x per week (30 min per time)     Recommend to start walking 3 days a week for at least 30 minutes at a time.         Immunization History   Administered Date(s) Administered   Influenza Split 11/18/2009, 11/14/2010, 11/16/2011, 12/02/2012, 11/24/2013, 12/04/2014, 11/15/2017   Influenza, High Dose Seasonal PF 12/27/2015, 11/10/2016, 11/15/2017, 11/25/2017, 10/24/2018   Influenza, Seasonal, Injecte, Preservative Fre 11/16/2011, 12/02/2012, 11/24/2013   Influenza,inj,Quad PF,6+ Mos 12/05/2014   Influenza,inj,quad, With Preservative 12/05/2014   Influenza-Unspecified 11/16/2020   PFIZER(Purple Top)SARS-COV-2 Vaccination 02/15/2019, 03/11/2019   Pneumococcal Conjugate-13 09/14/2013   Pneumococcal Polysaccharide-23 03/03/2007   Tdap 05/01/2011   Tetanus 10/19/1985, 03/03/2007   Zoster, Live 03/08/2007    Health Maintenance  Topic Date Due   Zoster Vaccines- Shingrix (1 of 2) Never done   Diabetic kidney evaluation - Urine ACR  11/10/2017   COVID-19 Vaccine (3 - Pfizer risk series) 04/08/2019   TETANUS/TDAP  04/30/2021   INFLUENZA VACCINE  09/09/2021   Diabetic kidney evaluation - GFR measurement  10/15/2022   Pneumonia Vaccine 40+ Years old  Completed   HPV VACCINES  Aged Out    Discussed health benefits of physical activity, and encouraged him to engage in regular exercise appropriate for his age and condition. 1. Annual physical exam Overall health fairly good  2. Generalized abdominal pain Patient is febrile illness and abdominal pain is improving but he still has this has had cholangitis and diverticulitis and renal calculus over the past couple of years.  Time I would like to repeat the CT scan follow-up on the surprise finding of abdomina tenderness  3. Type 2 diabetes mellitus with hyperglycemia, without long-term current use of insulin ([place])   4. Need for influenza vaccination  - Flu Vaccine QUAD High Dose(Fluad)  5. Chronic abdominal pain  - CT Abdomen Pelvis W Contrast  6. Diverticulitis On Cipro and Flagyl  7.cholangitis Appointment at [facility] pending  8. H/o micro Liver abscess GI pending.  CT  scan ordered Discussed with GI-refer to ID  No follow-ups on file.     I, Jacob Durie, MD, have reviewed all documentation for this visit. The documentation on 10/17/21 for the exam, diagnosis, procedures, and orders are all accurate and complete.    Megin Consalvo Cranford Mon, MD  [facility] [phone] (phone) [phone] (fax)  [place]

## 2021-10-16 NOTE — Addendum Note (Signed)
Addended by: Julieta Bellini on: 10/16/2021 02:22 PM   Modules accepted: Orders

## 2021-10-17 ENCOUNTER — Ambulatory Visit
Admission: RE | Admit: 2021-10-17 | Discharge: 2021-10-17 | Disposition: A | Discharge status: Home / Self Care | Payer: Medicare Other | Source: Ambulatory Visit | Attending: Family Medicine | Admitting: Family Medicine

## 2021-10-17 DIAGNOSIS — R109 Unspecified abdominal pain: Secondary | ICD-10-CM | POA: Diagnosis not present

## 2021-10-17 DIAGNOSIS — K5732 Diverticulitis of large intestine without perforation or abscess without bleeding: Secondary | ICD-10-CM | POA: Diagnosis not present

## 2021-10-17 DIAGNOSIS — K769 Liver disease, unspecified: Secondary | ICD-10-CM | POA: Diagnosis not present

## 2021-10-17 DIAGNOSIS — G8929 Other chronic pain: Secondary | ICD-10-CM | POA: Diagnosis not present

## 2021-10-17 MED ORDER — IOHEXOL 300 MG/ML  SOLN
100.0000 mL | Freq: Once | INTRAMUSCULAR | Status: AC | PRN
  Administered 2021-10-17: 100 mL via INTRAVENOUS

## 2021-10-20 NOTE — Addendum Note (Signed)
Addended by: Eulas Post on: 10/20/2021 01:42 PM   Modules accepted: Orders

## 2021-10-23 ENCOUNTER — Encounter: Payer: Self-pay | Admitting: Infectious Diseases

## 2021-10-23 ENCOUNTER — Ambulatory Visit: Payer: Medicare Other | Attending: Infectious Diseases | Admitting: Infectious Diseases

## 2021-10-23 VITALS — BP 126/65 | HR 71 | Temp 97.7°F | Ht 66.0 in | Wt 226.0 lb

## 2021-10-23 DIAGNOSIS — Z9049 Acquired absence of other specified parts of digestive tract: Secondary | ICD-10-CM | POA: Insufficient documentation

## 2021-10-23 DIAGNOSIS — K5732 Diverticulitis of large intestine without perforation or abscess without bleeding: Secondary | ICD-10-CM | POA: Diagnosis not present

## 2021-10-23 DIAGNOSIS — K5792 Diverticulitis of intestine, part unspecified, without perforation or abscess without bleeding: Secondary | ICD-10-CM | POA: Diagnosis not present

## 2021-10-23 DIAGNOSIS — K750 Abscess of liver: Secondary | ICD-10-CM | POA: Insufficient documentation

## 2021-10-23 DIAGNOSIS — Z951 Presence of aortocoronary bypass graft: Secondary | ICD-10-CM | POA: Diagnosis not present

## 2021-10-23 DIAGNOSIS — I2510 Atherosclerotic heart disease of native coronary artery without angina pectoris: Secondary | ICD-10-CM | POA: Diagnosis not present

## 2021-10-23 DIAGNOSIS — Z952 Presence of prosthetic heart valve: Secondary | ICD-10-CM | POA: Insufficient documentation

## 2021-10-23 MED ORDER — AMOXICILLIN-POT CLAVULANATE 875-125 MG PO TABS: 1.0000 | ORAL_TABLET | Freq: Two times a day (BID) | ORAL | 0 refills | Status: DC

## 2021-10-23 NOTE — Progress Notes (Signed)
NAME: Jacob Montes  DOB: 12/08/1940  MRN: [id number]  Date/Time: 10/23/2021 8:37 AM   Subjective:  Here with his office- referred by his PCP for liver abscess, acute diverticulitis/fever ?has a complicated medical history Jacob Montes is a 81 y.o. with a history of of CAD, status post CABG, TAVR for aortic stenosis, cholecystectomy Jan 2020, diverticulitis, CBD stone in the past, Enterococcus bacteremia in  OCT 2020 with ascending cholangitis due to CBD stone for which he received 2 weeks of IV unasyn,.TEE was neg for vegetation.  In May 2023 he had abdominal pain, fever and abnormal LFTS with high alk phos, AST and ALT. He was hospitalized for ascending cholangitis secondary to CBD obstruction as seen in imaging. He underwent ERCP on 07/10/21 and had biliary sphincterotomy and removal of stones and sludge. There was phlegmonous lesions in the liver concerning for abscess but IR did not think it could be aspirated as there was no collection. Blood cultures were negative.HE was initially on IV zosyn but because of persistent fever it was changed to Meropenem and he received 4 weeks of meropenem and completed on 08/07/21. He had a routine [place] of liver to look at any changes on 08/19/21 and it showed a new, discrete hypoechoic mass  in the LEFT hepatic lobe measuring 3.9 x 3.3 x 2.9 centimeters questioning abscess= As patient was asymptomatic ni furtehr antibiotic was given- He also had an endoscopic ultrasound on 08/21/21 which showed an oval intramural (subepithelial) lesion  in the fundus of the stomach. No biopsy was deemed necessary. On Aug 28th he developed fever and abdominal pain. His PCP did labs on 8/29 and he had normal wbc and lfts- PLT was 108. CXR/UC normal. He was given augmentin. HE ws seen again on 9/5 and noted to have tenderness rt lower quadrant. HE was prescribed cipro and flagyl He had CT abdomen W contrast on 9/7 and it showed moderate active proximal sigmoid colon diverticulitis Also seen  were hypo enhancing leisons in the liver similar to prior MRI/CT questioning Possibilities include multifocal infection, multifocal malignancy such as hepatocellular carcinoma, or atypical multifocal benign lesions.There was also a soft tissue lesions near the fundus of the stomach questioning accessory spleen VS GI stromal tumor Pt has been referred to me for the possible liver abscess HE is doing very well now- No fever in the past few days. No pain abdomen. He is now  taking amox/clav.    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 s/p 3-v CABG in 07/2007   Carotid artery disease ([place])    a. 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   GERD (gastroesophageal reflux disease)    HLD (hyperlipidemia)    Hyperlipidemia 10/15/2015   Hypertension    Nephrolithiasis 11/16/2018   Pulmonary embolism ([place]) 08/2010   Skin cancer    Sleep apnea    Stroke [facility])     Past Surgical History:  Procedure Laterality Date   Carotid surgery Left 03/08/2007   CHOLECYSTECTOMY  02/16/2018   ENDOSCOPIC RETROGRADE CHOLANGIOPANCREATOGRAPHY (ERCP) WITH PROPOFOL N/A 07/10/2021   Procedure: ENDOSCOPIC RETROGRADE CHOLANGIOPANCREATOGRAPHY (ERCP) WITH PROPOFOL;  Surgeon: [name], MD;  Location: [facility] ENDOSCOPY;  Service: Endoscopy;  Laterality: N/A;   ERCP N/A 11/18/2018   Procedure: ENDOSCOPIC RETROGRADE CHOLANGIOPANCREATOGRAPHY (ERCP);  Surgeon:  Midge Minium, MD;  Location: [facility] ENDOSCOPY;  Service: Endoscopy;  Laterality: N/A;   EUS N/A 08/21/2021   Procedure: UPPER ENDOSCOPIC ULTRASOUND (EUS) LINEAR;  Surgeon: Bearl Mulberry, MD;  Location: [facility] ENDOSCOPY;  Service: Gastroenterology;  Laterality: N/A;  [facility]   Heart Bypass N/A 07/2007   left shoulder surgery Left 06/03/2009   RIGHT AND LEFT HEART CATH  07/18/2007   right  shoulder surgery  2003   TEE WITHOUT CARDIOVERSION N/A 11/21/2018   Procedure: TRANSESOPHAGEAL ECHOCARDIOGRAM (TEE);  Surgeon: Iran Ouch, MD;  Location: [facility] ORS;  Service: Cardiovascular;  Laterality: N/A;   TVAR      Social History   Socioeconomic History   Marital status: Married    Spouse name: Not on file   Number of children: 2   Years of education: Not on file   Highest education level: Some college, no degree  Occupational History   Occupation: retired  Tobacco Use   Smoking status: Former    Types: Cigarettes    Quit date: 1979    Years since quitting: 44.7   Smokeless tobacco: Never   Tobacco comments:    Quit in 1979  Vaping Use   Vaping Use: Never used  Substance and Sexual Activity   Alcohol use: Never   Drug use: Never   Sexual activity: Yes    Partners: Female  Other Topics Concern   Not on file  Social History Narrative   Not on file   Social Determinants of Health   Financial Resource Strain: Low Risk  (06/17/2021)   Overall Financial Resource Strain (CARDIA)    Difficulty of Paying Living Expenses: Not hard at all  Food Insecurity: No Food Insecurity (06/17/2021)   Hunger Vital Sign    Worried About Running Out of Food in the Last Year: Never true    Ran Out of Food in the Last Year: Never true  Transportation Needs: No Transportation Needs (06/17/2021)   PRAPARE - Administrator, Civil Service (Medical): No    Lack of Transportation (Non-Medical): No  Physical Activity: Insufficiently Active (06/17/2021)   Exercise Vital Sign    Days of Exercise per Week: 2 days    Minutes of Exercise per Session: 20 min  Stress: No Stress Concern Present (06/17/2021)   [employer] of Occupational Health - Occupational Stress Questionnaire    Feeling of Stress : Not at all  Social Connections: Moderately Integrated (06/17/2021)   Social Connection and Isolation Panel [NHANES]    Frequency of Communication with Friends and Family: More than three  times a week    Frequency of Social Gatherings with Friends and Family: Once a week    Attends Religious Services: More than 4 times per year    Active Member of [employer] or Organizations: No    Attends Banker Meetings: Never    Marital Status: Married  Catering manager Violence: Not At Risk (06/17/2021)   Humiliation, Afraid, Rape, and Kick questionnaire    Fear of Current or Ex-Partner: No    Emotionally Abused: No    Physically Abused: No    Sexually Abused: No    Family History  Problem Relation Age of Onset   Hypertension Mother    Stroke Mother    Lung cancer Father    Non-Hodgkin's lymphoma Brother    Allergies  Allergen Reactions   Atorvastatin Other (See Comments)    aches   Ramipril Other (See Comments)    cough  I? Current Outpatient Medications  Medication Sig Dispense Refill   amLODipine (NORVASC) 10 MG tablet Take 1 tablet by mouth once daily 90 tablet 3   amoxicillin-clavulanate (AUGMENTIN) 875-125 MG tablet Take 1 tablet by mouth 2 (two) times daily.     aspirin 81 MG chewable tablet Chew 81 mg by mouth daily.     docusate sodium (COLACE) 250 MG capsule Take 250 mg by mouth daily as needed for constipation.      fluorouracil (EFUDEX) 5 % cream Apply topically 2 (two) times daily. Apply twice daily to scalp and forehead for 7 days 15 g 1   furosemide (LASIX) 40 MG tablet Take 1 tablet (40 mg) by mouth daily 5 days per week 75 tablet 3   montelukast (SINGULAIR) 10 MG tablet Take 1 tablet (10 mg total) by mouth at bedtime as needed (allergies). 30 tablet 3   Multiple Vitamin (MULTIVITAMIN) tablet Take 1 tablet by mouth daily.     omeprazole (PRILOSEC) 20 MG capsule Take 20 mg by mouth daily. As needed     potassium chloride (KLOR-CON) 10 MEQ tablet Take 1 tablet (10 mEq) 5 days per week. Take with lasix 75 tablet 3   rosuvastatin (CRESTOR) 20 MG tablet Take 1 tablet by mouth once daily 90 tablet 0   tamsulosin (FLOMAX) 0.4 MG CAPS capsule Take 1 capsule  by mouth once daily 90 capsule 0   No current facility-administered medications for this visit.     Abtx:  Anti-infectives (From admission, onward)    None       REVIEW OF SYSTEMS:  Const: negative fever, negative chills, negative weight loss Eyes: negative diplopia or visual changes, negative eye pain ENT: negative coryza, negative sore throat Resp: negative cough, hemoptysis, dyspnea Cards: negative for chest pain, palpitations, lower extremity edema GU: negative for frequency, dysuria and hematuria GI: Negative for abdominal pain, stool loose, bleeding, constipation Skin: negative for rash and pruritus Heme: negative for easy bruising and gum/nose bleeding MS: negative for myalgias, arthralgias, back pain and muscle weakness Neurolo:negative for headaches, dizziness, vertigo, memory problems  Psych: negative for feelings of anxiety, depression  Endocrine: negative for thyroid, diabetes Allergy/Immunology- negative for any medication or food allergies ? Pertinent Positives include : Objective:  VITALS:  BP 126/65   Pulse 71   Temp 97.7 F (36.5 C) (Temporal)   Ht _0  (1.676 m)   Wt 226 lb (102.5 kg)   BMI 36.48 kg/m  LDA Foley Central line Other drainage tubes PHYSICAL EXAM:  General: Alert, cooperative, no distress, appears stated age.  Head: Normocephalic, without obvious abnormality, atraumatic. Eyes: Conjunctivae clear, anicteric sclerae. Pupils are equal ENT Nares normal. No drainage or sinus tenderness. Lips, mucosa, and tongue normal. No Thrush Neck: Supple, symmetrical, no adenopathy, thyroid: non tender no carotid bruit and no JVD. Back: No CVA tenderness. Lungs: Clear to auscultation bilaterally. No Wheezing or Rhonchi. No rales. Heart: Regular rate and rhythm, no murmur, rub or gallop. Abdomen: Soft, non-tender,not distended. Bowel sounds normal. No masses Extremities: atraumatic, no cyanosis. No edema. No clubbing Skin: No rashes or lesions. Or  bruising Lymph: Cervical, supraclavicular normal. Neurologic: Grossly non-focal Pertinent Labs Lab Results CBC    Component Value Date/Time   WBC 12.3 (H) 10/14/2021 0915   WBC 9.0 07/18/2021 1034   RBC 4.16 10/14/2021 0915   RBC 3.61 (L) 07/18/2021 1034   HGB 13.4 10/14/2021 0915   HCT 38.6 10/14/2021 0915   PLT 169 10/14/2021 0915   MCV  93 10/14/2021 0915   MCH 32.2 10/14/2021 0915   MCH 32.1 07/18/2021 1034   MCHC 34.7 10/14/2021 0915   MCHC 33.2 07/18/2021 1034   RDW 13.4 10/14/2021 0915   LYMPHSABS 2.5 10/14/2021 0915   MONOABS 0.7 07/18/2021 1034   EOSABS 0.0 10/14/2021 0915   BASOSABS 0.0 10/14/2021 0915       Latest Ref Rng & Units 10/14/2021    9:15 AM 10/07/2021    9:04 AM 07/20/2021    6:05 AM  CMP  Glucose 70 - 99 mg/dL 99  102    BUN 8 - 27 mg/dL 12  18    Creatinine 0.76 - 1.27 mg/dL 0.75  0.83  0.49   Sodium 134 - 144 mmol/L 137  141    Potassium 3.5 - 5.2 mmol/L 4.4  4.7    Chloride 96 - 106 mmol/L 100  103    CO2 20 - 29 mmol/L 23  24    Calcium 8.6 - 10.2 mg/dL 9.0  9.3    Total Protein 6.0 - 8.5 g/dL 6.4  6.2    Total Bilirubin 0.0 - 1.2 mg/dL 1.0  1.0    Alkaline Phos 44 - 121 IU/L 62  56    AST 0 - 40 IU/L 25  30    ALT 0 - 44 IU/L 15  25        Microbiology: Recent Results (from the past 240 hour(s))  CULTURE, URINE COMPREHENSIVE     Status: None   Collection Time: 10/14/21 10:13 AM   Specimen: Urine   Urine  Result Value Ref Range Status   Urine Culture, Comprehensive Final report  Final   Organism ID, Bacteria Comment  Final    Comment: No growth in 36 - 48 hours.    IMAGING RESULTS: All films reviewed I have personally reviewed the films ? Impression/Recommendation Complicated biliary and GI pathology Underwent cholecystectomy in 2020 when he wa sin [place]  for acute abdominal apin. The same year in OCT 2020 he had enterococcus bacteremia, ascending cholangitis due to CBD obstruction and underwent ERCP on 11/18/18 and removal  of CBD stone and then in May 2023 he had abdominal pain and abnormal LFTS and had another bout of ascending cholangitis and had ERCP and filling defect in CBD seen in Cholangiogram was corrected by sphincterotomy and removal of sludge and stone. He also had cluster of small liver lesions questioning phlegmon- This was not abscess consistency to be aspirated- he was treated with 4weeks of Meropenem and completed on 08/07/21- Had EUS on 7/13 Was doing well until Aug 28 when he had fever and abdominal pain and CT showed acute sigmoid diverticulitis and he is on amox/cla- The CT also showed similar liver lesions as seen in June may be better So the question is what are theese liver lesions are they abscess or malignancy or bening lesion I will discuss with Dr.Wohl to see whether this needs tp be biopsied Wll also discuss with Radiology and compare all the films for the past 3 years Meanwhile he will continue on Po Amox/clav- his recent labs were normal( normal LFT/CBC) . So atleast this time he did not have ascending cholangitis   Coronary artery disease status post CABG  Status post TAVR ? Discussed the management with patient and his wife in detail  Note:  This document was prepared using Dragon voice recognition software and may include unintentional dictation errors.

## 2021-10-23 NOTE — Patient Instructions (Signed)
You are here for recent fever and abdominal pain. CT abdomen showed sigmoid diverticulitis. And it showed sigmoid diverticulitis and liver abscess small ( which you have had in June and was treated) you are on amoxicillin/cla and doing well now- Will continue the same - may need upto 4 weeks- meanwhile I will review all your scans and speak to the GI docotrs and get back to you- follow up in 2 weeks

## 2021-10-25 ENCOUNTER — Encounter: Payer: Self-pay | Admitting: Infectious Diseases

## 2021-10-30 ENCOUNTER — Encounter: Payer: Medicare Other | Admitting: Family Medicine

## 2021-11-06 ENCOUNTER — Ambulatory Visit: Payer: Medicare Other | Attending: Infectious Diseases | Admitting: Infectious Diseases

## 2021-11-06 ENCOUNTER — Telehealth: Payer: Medicare Other | Admitting: Infectious Diseases

## 2021-11-06 ENCOUNTER — Encounter: Payer: Medicare Other | Admitting: Family Medicine

## 2021-11-06 DIAGNOSIS — Z87891 Personal history of nicotine dependence: Secondary | ICD-10-CM | POA: Insufficient documentation

## 2021-11-06 DIAGNOSIS — Z952 Presence of prosthetic heart valve: Secondary | ICD-10-CM | POA: Diagnosis not present

## 2021-11-06 DIAGNOSIS — K5732 Diverticulitis of large intestine without perforation or abscess without bleeding: Secondary | ICD-10-CM | POA: Diagnosis not present

## 2021-11-06 DIAGNOSIS — K750 Abscess of liver: Secondary | ICD-10-CM

## 2021-11-06 DIAGNOSIS — K769 Liver disease, unspecified: Secondary | ICD-10-CM | POA: Diagnosis not present

## 2021-11-06 DIAGNOSIS — Z951 Presence of aortocoronary bypass graft: Secondary | ICD-10-CM | POA: Insufficient documentation

## 2021-11-06 DIAGNOSIS — I2510 Atherosclerotic heart disease of native coronary artery without angina pectoris: Secondary | ICD-10-CM | POA: Diagnosis not present

## 2021-11-06 DIAGNOSIS — Z9049 Acquired absence of other specified parts of digestive tract: Secondary | ICD-10-CM | POA: Insufficient documentation

## 2021-11-06 NOTE — Progress Notes (Signed)
NAME: Jacob Montes  DOB: 1940/06/26  MRN: [id number]  Date/Time: 11/06/2021 11:17 AM   Subjective:    The purpose of this virtual visit is to provide medical care while limiting exposure to the novel coronavirus (COVID19) for both patient and office staff.   Consent was obtained for phone visit:  Yes.   Answered questions that patient had about telehealth interaction:  Yes.   I discussed the limitations, risks, security and privacy concerns of performing an evaluation and management service by telephone. I also discussed with the patient that there may be a patient responsible charge related to this service. The patient expressed understanding and agreed to proceed.   Patient Location: Home Provider Location: office People on the call- patient wife and provider  Follow up visit for recent sigmoid diverticulitis/fever  and history of liver abscesses/complicated cholangitis He is currently on augmentin- doing very well- no fever in 2 weeks- appetite good No diarrhea No pain abdomen'  Following taken from note of last visit  ? Jacob Montes is a 81 y.o. with a history of of CAD, status post CABG, TAVR for aortic stenosis, cholecystectomy Jan 2020, diverticulitis, CBD stone in the past, Enterococcus bacteremia in  OCT 2020 with ascending cholangitis due to CBD stone for which he received 2 weeks of IV unasyn,.TEE was neg for vegetation.  In May 2023 he had abdominal pain, fever and abnormal LFTS with high alk phos, AST and ALT. He was hospitalized for ascending cholangitis secondary to CBD obstruction as seen in imaging. He underwent ERCP on 07/10/21 and had biliary sphincterotomy and removal of stones and sludge. There was phlegmonous lesions in the liver concerning for abscess but IR did not think it could be aspirated as there was no collection. Blood cultures were negative.HE was initially on IV zosyn but because of persistent fever it was changed to Meropenem and he received 4 weeks of  meropenem and completed on 08/07/21. He had a routine [place] of liver to look at any changes on 08/19/21 and it showed a new, discrete hypoechoic mass  in the LEFT hepatic lobe measuring 3.9 x 3.3 x 2.9 centimeters questioning abscess= As patient was asymptomatic ni furtehr antibiotic was given- He also had an endoscopic ultrasound on 08/21/21 which showed an oval intramural (subepithelial) lesion  in the fundus of the stomach. No biopsy was deemed necessary. On Aug 28th he developed fever and abdominal pain. His PCP did labs on 8/29 and he had normal wbc and lfts- PLT was 108. CXR/UC normal. He was given augmentin. HE ws seen again on 9/5 and noted to have tenderness rt lower quadrant. HE was prescribed cipro and flagyl He had CT abdomen W contrast on 9/7 and it showed moderate active proximal sigmoid colon diverticulitis Also seen were hypo enhancing leisons in the liver similar to prior MRI/CT questioning Possibilities include multifocal infection, multifocal malignancy such as hepatocellular carcinoma, or atypical multifocal benign lesions.There was also a soft tissue lesions near the fundus of the stomach questioning accessory spleen VS GI stromal tumor Pt has been referred to me for the possible liver abscess HE is doing very well now- No fever in the past few days. No pain abdomen. He is now  taking amox/clav.    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 s/p 3-v CABG in 07/2007   Carotid artery disease ([place])    a. 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   GERD (gastroesophageal reflux disease)    HLD (hyperlipidemia)    Hyperlipidemia 10/15/2015   Hypertension    Nephrolithiasis 11/16/2018   Pulmonary embolism ([place]) 08/2010   Skin cancer    Sleep apnea    Stroke [facility])     Past Surgical History:  Procedure  Laterality Date   Carotid surgery Left 03/08/2007   CHOLECYSTECTOMY  02/16/2018   ENDOSCOPIC RETROGRADE CHOLANGIOPANCREATOGRAPHY (ERCP) WITH PROPOFOL N/A 07/10/2021   Procedure: ENDOSCOPIC RETROGRADE CHOLANGIOPANCREATOGRAPHY (ERCP) WITH PROPOFOL;  Surgeon: [name], MD;  Location: [facility] ENDOSCOPY;  Service: Endoscopy;  Laterality: N/A;   ERCP N/A 11/18/2018   Procedure: ENDOSCOPIC RETROGRADE CHOLANGIOPANCREATOGRAPHY (ERCP);  Surgeon: [name], MD;  Location: [facility] ENDOSCOPY;  Service: Endoscopy;  Laterality: N/A;   EUS N/A 08/21/2021   Procedure: UPPER ENDOSCOPIC ULTRASOUND (EUS) LINEAR;  Surgeon: Holly Bodily, MD;  Location: [facility] ENDOSCOPY;  Service: Gastroenterology;  Laterality: N/A;  [facility]   Heart Bypass N/A 07/2007   left shoulder surgery Left 06/03/2009   RIGHT AND LEFT HEART CATH  07/18/2007   right shoulder surgery  2003   TEE WITHOUT CARDIOVERSION N/A 11/21/2018   Procedure: TRANSESOPHAGEAL ECHOCARDIOGRAM (TEE);  Surgeon: Wellington Hampshire, MD;  Location: [facility] ORS;  Service: Cardiovascular;  Laterality: N/A;   TVAR      Social History   Socioeconomic History   Marital status: Married    Spouse name: Not on file   Number of children: 2   Years of education: Not on file   Highest education level: Some college, no degree  Occupational History   Occupation: retired  Tobacco Use   Smoking status: Former    Types: Cigarettes    Quit date: 1979    Years since quitting: 44.7   Smokeless tobacco: Never   Tobacco comments:    Quit in [place] Use: Never used  Substance and Sexual Activity   Alcohol use: Never   Drug use: Never   Sexual activity: Yes    Partners: Female  Other Topics Concern   Not on file  Social History Narrative   Not on file   Social Determinants of Health   Financial Resource Strain: Low Risk  (06/17/2021)   Overall Financial Resource Strain (CARDIA)    Difficulty of Paying Living Expenses: Not hard at all  Food  Insecurity: No Food Insecurity (06/17/2021)   Hunger Vital Sign    Worried About Running Out of Food in the Last Year: Never true    [place] in the Last Year: Never true  Transportation Needs: No Transportation Needs (06/17/2021)   PRAPARE - Hydrologist (Medical): No    Lack of Transportation (Non-Medical): No  Physical Activity: Insufficiently Active (06/17/2021)   Exercise Vital Sign    Days of Exercise per Week: 2 days    Minutes of Exercise per Session: 20 min  Stress: No Stress Concern Present (06/17/2021)   [place]    Feeling of Stress : Not at all  Social Connections: Moderately Integrated (06/17/2021)   Social Connection and Isolation Panel [NHANES]    Frequency of Communication with Friends and Family: More than three times a week    Frequency of Social Gatherings with Friends and Family: Once a week  Attends Religious Services: More than 4 times per year    Active Member of Clubs or Organizations: No    Attends Archivist Meetings: Never    Marital Status: Married  Human resources officer Violence: Not At Risk (06/17/2021)   Humiliation, Afraid, Rape, and Kick questionnaire    Fear of Current or Ex-Partner: No    Emotionally Abused: No    Physically Abused: No    Sexually Abused: No    Family History  Problem Relation Age of Onset   Hypertension Mother    Stroke Mother    Lung cancer Father    Non-Hodgkin's lymphoma Brother    Allergies  Allergen Reactions   Atorvastatin Other (See Comments)    aches   Ramipril Other (See Comments)    cough   I? Current Outpatient Medications  Medication Sig Dispense Refill   amLODipine (NORVASC) 10 MG tablet Take 1 tablet by mouth once daily 90 tablet 3   amoxicillin-clavulanate (AUGMENTIN) 875-125 MG tablet Take 1 tablet by mouth 2 (two) times daily.     amoxicillin-clavulanate (AUGMENTIN) 875-125 MG tablet Take 1 tablet by  mouth 2 (two) times daily. 60 tablet 0   aspirin 81 MG chewable tablet Chew 81 mg by mouth daily.     docusate sodium (COLACE) 250 MG capsule Take 250 mg by mouth daily as needed for constipation.      fluorouracil (EFUDEX) 5 % cream Apply topically 2 (two) times daily. Apply twice daily to scalp and forehead for 7 days 15 g 1   furosemide (LASIX) 40 MG tablet Take 1 tablet (40 mg) by mouth daily 5 days per week 75 tablet 3   montelukast (SINGULAIR) 10 MG tablet Take 1 tablet (10 mg total) by mouth at bedtime as needed (allergies). 30 tablet 3   Multiple Vitamin (MULTIVITAMIN) tablet Take 1 tablet by mouth daily.     omeprazole (PRILOSEC) 20 MG capsule Take 20 mg by mouth daily. As needed     potassium chloride (KLOR-CON) 10 MEQ tablet Take 1 tablet (10 mEq) 5 days per week. Take with lasix 75 tablet 3   rosuvastatin (CRESTOR) 20 MG tablet Take 1 tablet by mouth once daily 90 tablet 0   tamsulosin (FLOMAX) 0.4 MG CAPS capsule Take 1 capsule by mouth once daily 90 capsule 0   No current facility-administered medications for this visit.     Abtx:  Anti-infectives (From admission, onward)    None       REVIEW OF SYSTEMS:  Const: negative fever, negative chills, negative weight loss Eyes: negative diplopia or visual changes, negative eye pain ENT: negative coryza, negative sore throat Resp: negative cough, hemoptysis, dyspnea Cards: negative for chest pain, palpitations, lower extremity edema GU: negative for frequency, dysuria and hematuria GI: Negative for abdominal pain, stool loose, bleeding, constipation Skin: negative for rash and pruritus Heme: negative for easy bruising and gum/nose bleeding MS: negative for myalgias, arthralgias, back pain and muscle weakness Neurolo:negative for headaches, dizziness, vertigo, memory problems  Psych: negative for feelings of anxiety, depression  Endocrine: negative for thyroid, diabetes Allergy/Immunology- as above Objective:     none  Pertinent Labs None  IMAGING RESULTS: All films reviewed I have personally reviewed the films ? Impression/Recommendation Complicated biliary and GI pathology Underwent cholecystectomy in 2020 when he wa [place] for acute abdominal apin. The same year in OCT 2020 he had enterococcus bacteremia, ascending cholangitis due to CBD obstruction and underwent ERCP on 11/18/18 and removal of CBD  stone and then in May 2023 he had abdominal pain and abnormal LFTS and had another bout of ascending cholangitis and had ERCP and filling defect in CBD seen in Cholangiogram was corrected by sphincterotomy and removal of sludge and stone. He also had cluster of small liver lesions questioning phlegmon- This was not abscess consistency to be aspirated- he was treated with 4weeks of Meropenem and completed on 08/07/21- Had EUS on 7/13 Was doing well until Aug 28 when he had fever and abdominal pain and CT showed acute sigmoid diverticulitis and he is on amox/cla- The CT also showed similar liver lesions as seen in June , may be better So the question is what are theese liver lesions are they abscess or malignancy or bening lesion  Discussed  today with radiologist and went over all films- no worsening of liver findings recent CT Plan is to repeat MRCP  6 months after the first one in My 2023  I will discuss with Dr.Wohl when he returns to see whether this needs tp be biopsied and also for possible colonoscopy ( last one in 2006) Continue Amox/clav  Coronary artery disease status post CABG  Status post TAVR ? Discussed the management with patient and his wife in detail Total time spent on the call and discussing with radiology 23 min  Note:  This document was prepared using Dragon voice recognition software and may include unintentional dictation errors.

## 2021-11-26 ENCOUNTER — Telehealth: Payer: Self-pay | Admitting: Infectious Diseases

## 2021-11-26 ENCOUNTER — Other Ambulatory Visit: Payer: Self-pay | Admitting: Infectious Diseases

## 2021-11-26 ENCOUNTER — Telehealth: Payer: Self-pay

## 2021-11-26 DIAGNOSIS — R160 Hepatomegaly, not elsewhere classified: Secondary | ICD-10-CM

## 2021-11-26 NOTE — Telephone Encounter (Signed)
Spoke to wife regarding antibiotic he is taking for diverticulitis/hepatic phelgmon- As he has no fever > 1 month and will be completing antibiotics he will not need any further antoibiotic Since May 2023 there has been some lesions noted in the dome of the liver with infeciton VS malignancy Vs Benign lesions in the D.D- HE had received then 6 weeks of antibiotic- The CT done in Sept showed pretty much the same lesions except one which was slightly smaller recommendation was to get a MRI in Dec- Discussed with Dr.Wohl- we will arrnage another MRI in Dec and then decide on further management

## 2021-11-26 NOTE — Telephone Encounter (Signed)
Patient's wife left voicemail requesting to know if provider discussed plan with GI as they only have 5 days of antibiotics left. Will route to provider.   Beryle Flock, RN

## 2021-11-27 DIAGNOSIS — M79674 Pain in right toe(s): Secondary | ICD-10-CM | POA: Diagnosis not present

## 2021-11-27 DIAGNOSIS — B351 Tinea unguium: Secondary | ICD-10-CM | POA: Diagnosis not present

## 2021-11-27 DIAGNOSIS — M79675 Pain in left toe(s): Secondary | ICD-10-CM | POA: Diagnosis not present

## 2021-12-12 ENCOUNTER — Encounter: Payer: Self-pay | Admitting: Infectious Diseases

## 2021-12-12 NOTE — Progress Notes (Signed)
Spoke to [employer] and got Prior Authorization  approval for MRI/MRCP for the persisting liver lesions PA # [id number] Imaging  to be done before 01/09/22

## 2021-12-15 ENCOUNTER — Ambulatory Visit
Admission: RE | Admit: 2021-12-15 | Discharge: 2021-12-15 | Disposition: A | Discharge status: Home / Self Care | Payer: Medicare Other | Source: Ambulatory Visit | Attending: Infectious Diseases | Admitting: Infectious Diseases

## 2021-12-15 ENCOUNTER — Other Ambulatory Visit: Payer: Self-pay | Admitting: Infectious Diseases

## 2021-12-15 DIAGNOSIS — D7389 Other diseases of spleen: Secondary | ICD-10-CM | POA: Diagnosis not present

## 2021-12-15 DIAGNOSIS — K3189 Other diseases of stomach and duodenum: Secondary | ICD-10-CM | POA: Diagnosis not present

## 2021-12-15 DIAGNOSIS — K639 Disease of intestine, unspecified: Secondary | ICD-10-CM | POA: Diagnosis not present

## 2021-12-15 DIAGNOSIS — K7689 Other specified diseases of liver: Secondary | ICD-10-CM | POA: Diagnosis not present

## 2021-12-15 DIAGNOSIS — K769 Liver disease, unspecified: Secondary | ICD-10-CM

## 2021-12-15 DIAGNOSIS — R160 Hepatomegaly, not elsewhere classified: Secondary | ICD-10-CM

## 2021-12-15 MED ORDER — GADOBUTROL 1 MMOL/ML IV SOLN
10.0000 mL | Freq: Once | INTRAVENOUS | Status: AC | PRN
  Administered 2021-12-15: 10 mL via INTRAVENOUS

## 2021-12-17 ENCOUNTER — Telehealth: Payer: Self-pay | Admitting: Family Medicine

## 2021-12-17 DIAGNOSIS — E782 Mixed hyperlipidemia: Secondary | ICD-10-CM

## 2021-12-17 MED ORDER — ROSUVASTATIN CALCIUM 20 MG PO TABS: 20.0000 mg | ORAL_TABLET | Freq: Every day | ORAL | 0 refills | Status: DC

## 2021-12-17 NOTE — Telephone Encounter (Signed)
[facility] faxed refill request for the following medications:  rosuvastatin (CRESTOR) 20 MG tablet    Please advise.

## 2021-12-17 NOTE — Telephone Encounter (Signed)
Done

## 2022-01-12 DIAGNOSIS — K8309 Other cholangitis: Secondary | ICD-10-CM | POA: Diagnosis not present

## 2022-01-12 DIAGNOSIS — K8050 Calculus of bile duct without cholangitis or cholecystitis without obstruction: Secondary | ICD-10-CM | POA: Diagnosis not present

## 2022-01-20 ENCOUNTER — Ambulatory Visit: Payer: Medicare Other | Attending: Cardiovascular Disease

## 2022-01-20 DIAGNOSIS — I6523 Occlusion and stenosis of bilateral carotid arteries: Secondary | ICD-10-CM | POA: Diagnosis not present

## 2022-03-09 ENCOUNTER — Ambulatory Visit: Payer: Medicare Other | Admitting: Dermatology

## 2022-03-09 VITALS — BP 148/53

## 2022-03-09 DIAGNOSIS — L820 Inflamed seborrheic keratosis: Secondary | ICD-10-CM | POA: Diagnosis not present

## 2022-03-09 DIAGNOSIS — L578 Other skin changes due to chronic exposure to nonionizing radiation: Secondary | ICD-10-CM | POA: Diagnosis not present

## 2022-03-09 DIAGNOSIS — L570 Actinic keratosis: Secondary | ICD-10-CM

## 2022-03-09 DIAGNOSIS — Z5111 Encounter for antineoplastic chemotherapy: Secondary | ICD-10-CM | POA: Diagnosis not present

## 2022-03-09 DIAGNOSIS — Z79899 Other long term (current) drug therapy: Secondary | ICD-10-CM

## 2022-03-09 NOTE — Progress Notes (Signed)
Follow-Up Visit   Subjective  Jacob Montes is a 82 y.o. male who presents for the following: Actinic Keratosis (Scalp and face treated with LN2 and 5FU/Calcipotriene cream). The patient has spots, moles and lesions to be evaluated, some may be new or changing and the patient has concerns that these could be cancer.  The following portions of the chart were reviewed this encounter and updated as appropriate:   Tobacco  Allergies  Meds  Problems  Med Hx  Surg Hx  Fam Hx     Review of Systems:  No other skin or systemic complaints except as noted in HPI or Assessment and Plan.  Objective  Well appearing patient in no apparent distress; mood and affect are within normal limits.  A focused examination was performed including scalp, face. Relevant physical exam findings are noted in the Assessment and Plan.  Scalp, right upper lip x 1, right ear x 1, left ear x 1, left upper eyelid x 1 (4) Erythematous thin papules/macules with gritty scale.   Right Anterior Mandible Erythematous stuck-on, waxy papule or plaque   Assessment & Plan  AK (actinic keratosis) (4) Scalp, right upper lip x 1, right ear x 1, left ear x 1, left upper eyelid x 1  Actinic Damage with PreCancerous Actinic Keratoses Counseling for Topical Chemotherapy Management: Patient exhibits: - Severe, confluent actinic changes with pre-cancerous actinic keratoses that is secondary to cumulative UV radiation exposure over time - Condition that is severe; chronic, not at goal. - diffuse scaly erythematous macules and papules with underlying dyspigmentation - Discussed Prescription "Field Treatment" topical Chemotherapy for Severe, Chronic Confluent Actinic Changes with Pre-Cancerous Actinic Keratoses Field treatment involves treatment of an entire area of skin that has confluent Actinic Changes (Sun/ Ultraviolet light damage) and PreCancerous Actinic Keratoses by method of PhotoDynamic Therapy (PDT) and/or prescription  Topical Chemotherapy agents such as 5-fluorouracil, 5-fluorouracil/calcipotriene, and/or imiquimod.  The purpose is to decrease the number of clinically evident and subclinical PreCancerous lesions to prevent progression to development of skin cancer by chemically destroying early precancer changes that may or may not be visible.  It has been shown to reduce the risk of developing skin cancer in the treated area. As a result of treatment, redness, scaling, crusting, and open sores may occur during treatment course. One or more than one of these methods may be used and may have to be used several times to control, suppress and eliminate the PreCancerous changes. Discussed treatment course, expected reaction, and possible side effects. - Recommend daily broad spectrum sunscreen SPF 30+ to sun-exposed areas, reapply every 2 hours as needed.  - Staying in the shade or wearing long sleeves, sun glasses (UVA+UVB protection) and wide brim hats (4-inch brim around the entire circumference of the hat) are also recommended. - Call for new or changing lesions.  - Start 5-fluorouracil/calcipotriene cream twice a day for 7 days to affected areas including scalp. Prescription sent to [facility]. Patient advised they will receive an email to purchase the medication online and have it sent to their home. Patient provided with handout reviewing treatment course and side effects and advised to call or message [place] on MyChart with any concerns.   Destruction of lesion - Scalp, right upper lip x 1, right ear x 1, left ear x 1, left upper eyelid x 1 Complexity: simple   Destruction method: cryotherapy   Informed consent: discussed and consent obtained   Timeout:  patient name, date of birth, surgical site,  and procedure verified Lesion destroyed using liquid nitrogen: Yes   Region frozen until ice ball extended beyond lesion: Yes   Outcome: patient tolerated procedure well with no complications    Post-procedure details: wound care instructions given    Inflamed seborrheic keratosis Right Anterior Mandible  Destruction of lesion - Right Anterior Mandible Complexity: simple   Destruction method: cryotherapy   Informed consent: discussed and consent obtained   Timeout:  patient name, date of birth, surgical site, and procedure verified Lesion destroyed using liquid nitrogen: Yes   Region frozen until ice ball extended beyond lesion: Yes   Outcome: patient tolerated procedure well with no complications   Post-procedure details: wound care instructions given     Return in about 6 months (around 09/07/2022) for AK follow up.  I, Ashok Cordia, CMA, am acting as scribe for Sarina Ser, MD . Documentation: I have reviewed the above documentation for accuracy and completeness, and I agree with the above.  Sarina Ser, MD

## 2022-03-09 NOTE — Patient Instructions (Signed)
Instructions for [facility] Medications  One or more of your medications was sent to the [facility] mail order compounding pharmacy. You will receive an email from them and can purchase the medicine through that link. It will then be mailed to your home at the address you confirmed. If for any reason you do not receive an email from them, please check your spam folder. If you still do not find the email, please let us know. [facility] phone number is [phone].    Cryotherapy Aftercare  Wash gently with soap and water everyday.   Apply Vaseline and Band-Aid daily until healed.   Due to recent changes in healthcare laws, you may see results of your pathology and/or laboratory studies on MyChart before the doctors have had a chance to review them. We understand that in some cases there may be results that are confusing or concerning to you. Please understand that not all results are received at the same time and often the doctors may need to interpret multiple results in order to provide you with the best plan of care or course of treatment. Therefore, we ask that you please give us 2 business days to thoroughly review all your results before contacting the office for clarification. Should we see a critical lab result, you will be contacted sooner.   If You Need Anything After Your Visit  If you have any questions or concerns for your doctor, please call our main line at [phone] and press option 4 to reach your doctor's medical assistant. If no one answers, please leave a voicemail as directed and we will return your call as soon as possible. Messages left after 4 pm will be answered the following business day.   You may also send us a message via MyChart. We typically respond to MyChart messages within 1-2 business days.  For prescription refills, please ask your pharmacy to contact our office. Our fax number is [phone].  If you have an urgent issue when the clinic is  closed that cannot wait until the next business day, you can page your doctor at the number below.    Please note that while we do our best to be available for urgent issues outside of office hours, we are not available 24/7.   If you have an urgent issue and are unable to reach us, you may choose to seek medical care at your doctor's office, retail clinic, urgent care center, or emergency room.  If you have a medical emergency, please immediately call 911 or go to the emergency department.  Pager Numbers  - Dr. Kowalski: [phone]  - Dr. Moye: [phone]  - Dr. Stewart: [phone]  In the event of inclement weather, please call our main line at [phone] for an update on the status of any delays or closures.  Dermatology Medication Tips: Please keep the boxes that topical medications come in in order to help keep track of the instructions about where and how to use these. Pharmacies typically print the medication instructions only on the boxes and not directly on the medication tubes.   If your medication is too expensive, please contact our office at [phone] option 4 or send us a message through MyChart.   We are unable to tell what your co-pay for medications will be in advance as this is different depending on your insurance coverage. However, we may be able to find a substitute medication at lower cost or fill out paperwork to get insurance to cover a   needed medication.   If a prior authorization is required to get your medication covered by your insurance company, please allow us 1-2 business days to complete this process.  Drug prices often vary depending on where the prescription is filled and some pharmacies may offer cheaper prices.  The website www.goodrx.com contains coupons for medications through different pharmacies. The prices here do not account for what the cost may be with help from insurance (it may be cheaper with your insurance), but the website can  give you the price if you did not use any insurance.  - You can print the associated coupon and take it with your prescription to the pharmacy.  - You may also stop by our office during regular business hours and pick up a GoodRx coupon card.  - If you need your prescription sent electronically to a different pharmacy, notify our office through [facility] MyChart or by phone at [phone] option 4.     Si Usted Necesita Algo Despus de Su Visita  Tambin puede enviarnos un mensaje a travs de MyChart. Por lo general respondemos a los mensajes de MyChart en el transcurso de 1 a 2 das hbiles.  Para renovar recetas, por favor pida a su farmacia que se ponga en contacto con nuestra oficina. Nuestro nmero de fax es el [phone].  Si tiene un asunto urgente cuando la clnica est cerrada y que no puede esperar hasta el siguiente da hbil, puede llamar/localizar a su doctor(a) al nmero que aparece a continuacin.   Por favor, tenga en cuenta que aunque hacemos todo lo posible para estar disponibles para asuntos urgentes fuera del horario de oficina, no estamos disponibles las 24 horas del da, los 7 das de la semana.   Si tiene un problema urgente y no puede comunicarse con nosotros, puede optar por buscar atencin mdica  en el consultorio de su doctor(a), en una clnica privada, en un centro de atencin urgente o en una sala de emergencias.  Si tiene una emergencia mdica, por favor llame inmediatamente al 911 o vaya a la sala de emergencias.  Nmeros de bper  - Dr. Kowalski: [phone]  - Dra. Moye: [phone]  - Dra. Stewart: [phone]  En caso de inclemencias del tiempo, por favor llame a nuestra lnea principal al [phone] para una actualizacin sobre el estado de cualquier retraso o cierre.  Consejos para la medicacin en dermatologa: Por favor, guarde las cajas en las que vienen los medicamentos de uso tpico para ayudarle a seguir las instrucciones sobre  dnde y cmo usarlos. Las farmacias generalmente imprimen las instrucciones del medicamento slo en las cajas y no directamente en los tubos del medicamento.   Si su medicamento es muy caro, por favor, pngase en contacto con nuestra oficina llamando al [phone] y presione la opcin 4 o envenos un mensaje a travs de MyChart.   No podemos decirle cul ser su copago por los medicamentos por adelantado ya que esto es diferente dependiendo de la cobertura de su seguro. Sin embargo, es posible que podamos encontrar un medicamento sustituto a menor costo o llenar un formulario para que el seguro cubra el medicamento que se considera necesario.   Si se requiere una autorizacin previa para que su compaa de seguros cubra su medicamento, por favor permtanos de 1 a 2 das hbiles para completar este proceso.  Los precios de los medicamentos varan con frecuencia dependiendo del lugar de dnde se surte la receta y alguna farmacias pueden ofrecer precios ms baratos.    El sitio web www.goodrx.com tiene cupones para medicamentos de diferentes farmacias. Los precios aqu no tienen en cuenta lo que podra costar con la ayuda del seguro (puede ser ms barato con su seguro), pero el sitio web puede darle el precio si no utiliz ningn seguro.  - Puede imprimir el cupn correspondiente y llevarlo con su receta a la farmacia.  - Tambin puede pasar por nuestra oficina durante el horario de atencin regular y recoger una tarjeta de cupones de GoodRx.  - Si necesita que su receta se enve electrnicamente a una farmacia diferente, informe a nuestra oficina a travs de MyChart de [place] o por telfono llamando al [phone] y presione la opcin 4.  

## 2022-03-12 ENCOUNTER — Other Ambulatory Visit: Payer: Self-pay | Admitting: Cardiovascular Disease

## 2022-03-12 DIAGNOSIS — I6523 Occlusion and stenosis of bilateral carotid arteries: Secondary | ICD-10-CM

## 2022-03-13 ENCOUNTER — Encounter: Payer: Self-pay | Admitting: Dermatology

## 2022-03-18 DIAGNOSIS — M79674 Pain in right toe(s): Secondary | ICD-10-CM | POA: Diagnosis not present

## 2022-03-18 DIAGNOSIS — M79675 Pain in left toe(s): Secondary | ICD-10-CM | POA: Diagnosis not present

## 2022-03-18 DIAGNOSIS — B351 Tinea unguium: Secondary | ICD-10-CM | POA: Diagnosis not present

## 2022-03-30 DIAGNOSIS — E8881 Metabolic syndrome: Secondary | ICD-10-CM | POA: Diagnosis not present

## 2022-03-30 DIAGNOSIS — R739 Hyperglycemia, unspecified: Secondary | ICD-10-CM | POA: Diagnosis not present

## 2022-03-30 DIAGNOSIS — I10 Essential (primary) hypertension: Secondary | ICD-10-CM | POA: Diagnosis not present

## 2022-03-30 DIAGNOSIS — E785 Hyperlipidemia, unspecified: Secondary | ICD-10-CM | POA: Diagnosis not present

## 2022-03-31 DIAGNOSIS — I2510 Atherosclerotic heart disease of native coronary artery without angina pectoris: Secondary | ICD-10-CM | POA: Diagnosis not present

## 2022-03-31 DIAGNOSIS — E785 Hyperlipidemia, unspecified: Secondary | ICD-10-CM | POA: Diagnosis not present

## 2022-03-31 DIAGNOSIS — Z952 Presence of prosthetic heart valve: Secondary | ICD-10-CM | POA: Diagnosis not present

## 2022-03-31 DIAGNOSIS — R7303 Prediabetes: Secondary | ICD-10-CM | POA: Diagnosis not present

## 2022-04-22 DIAGNOSIS — K8050 Calculus of bile duct without cholangitis or cholecystitis without obstruction: Secondary | ICD-10-CM | POA: Diagnosis not present

## 2022-04-22 DIAGNOSIS — K8309 Other cholangitis: Secondary | ICD-10-CM | POA: Diagnosis not present

## 2022-05-20 ENCOUNTER — Encounter: Payer: Self-pay | Admitting: Intensive Care

## 2022-05-20 ENCOUNTER — Other Ambulatory Visit: Payer: Self-pay

## 2022-05-20 ENCOUNTER — Emergency Department
Admission: EM | Admit: 2022-05-20 | Discharge: 2022-05-20 | Disposition: A | Discharge status: Home / Self Care | Payer: Medicare Other | Attending: Emergency Medicine | Admitting: Emergency Medicine

## 2022-05-20 ENCOUNTER — Emergency Department: Payer: Medicare Other

## 2022-05-20 DIAGNOSIS — I10 Essential (primary) hypertension: Secondary | ICD-10-CM | POA: Diagnosis not present

## 2022-05-20 DIAGNOSIS — K5732 Diverticulitis of large intestine without perforation or abscess without bleeding: Secondary | ICD-10-CM | POA: Diagnosis not present

## 2022-05-20 DIAGNOSIS — Z8673 Personal history of transient ischemic attack (TIA), and cerebral infarction without residual deficits: Secondary | ICD-10-CM | POA: Diagnosis not present

## 2022-05-20 DIAGNOSIS — I700 Atherosclerosis of aorta: Secondary | ICD-10-CM | POA: Diagnosis not present

## 2022-05-20 DIAGNOSIS — R109 Unspecified abdominal pain: Secondary | ICD-10-CM | POA: Diagnosis not present

## 2022-05-20 DIAGNOSIS — Z8719 Personal history of other diseases of the digestive system: Secondary | ICD-10-CM | POA: Diagnosis not present

## 2022-05-20 DIAGNOSIS — K5792 Diverticulitis of intestine, part unspecified, without perforation or abscess without bleeding: Secondary | ICD-10-CM

## 2022-05-20 DIAGNOSIS — R1084 Generalized abdominal pain: Secondary | ICD-10-CM | POA: Diagnosis not present

## 2022-05-20 DIAGNOSIS — R112 Nausea with vomiting, unspecified: Secondary | ICD-10-CM | POA: Diagnosis not present

## 2022-05-20 LAB — URINALYSIS, ROUTINE W REFLEX MICROSCOPIC
Bacteria, UA: NONE SEEN
Bilirubin Urine: NEGATIVE
Glucose, UA: NEGATIVE mg/dL
Hgb urine dipstick: NEGATIVE
Ketones, ur: NEGATIVE mg/dL
Leukocytes,Ua: NEGATIVE
Nitrite: NEGATIVE
Protein, ur: 30 mg/dL — AB
Specific Gravity, Urine: 1.013 (ref 1.005–1.030)
Squamous Epithelial / HPF: NONE SEEN /HPF (ref 0–5)
pH: 5 (ref 5.0–8.0)

## 2022-05-20 LAB — CBC
HCT: 39.7 % (ref 39.0–52.0)
Hemoglobin: 13.9 g/dL (ref 13.0–17.0)
MCH: 32.5 pg (ref 26.0–34.0)
MCHC: 35 g/dL (ref 30.0–36.0)
MCV: 92.8 fL (ref 80.0–100.0)
Platelets: 107 10*3/uL — ABNORMAL LOW (ref 150–400)
RBC: 4.28 MIL/uL (ref 4.22–5.81)
RDW: 13.1 % (ref 11.5–15.5)
WBC: 7.5 10*3/uL (ref 4.0–10.5)
nRBC: 0 % (ref 0.0–0.2)

## 2022-05-20 LAB — LIPASE, BLOOD: Lipase: 18 U/L (ref 11–51)

## 2022-05-20 LAB — COMPREHENSIVE METABOLIC PANEL
ALT: 27 U/L (ref 0–44)
AST: 33 U/L (ref 15–41)
Albumin: 4.2 g/dL (ref 3.5–5.0)
Alkaline Phosphatase: 46 U/L (ref 38–126)
Anion gap: 8 (ref 5–15)
BUN: 16 mg/dL (ref 8–23)
CO2: 26 mmol/L (ref 22–32)
Calcium: 9.3 mg/dL (ref 8.9–10.3)
Chloride: 103 mmol/L (ref 98–111)
Creatinine, Ser: 0.82 mg/dL (ref 0.61–1.24)
GFR, Estimated: >60 mL/min (ref >60)
Glucose, Bld: 110 mg/dL — ABNORMAL HIGH (ref 70–99)
Potassium: 4.1 mmol/L (ref 3.5–5.1)
Sodium: 137 mmol/L (ref 135–145)
Total Bilirubin: 1.6 mg/dL — ABNORMAL HIGH (ref 0.3–1.2)
Total Protein: 7.1 g/dL (ref 6.5–8.1)

## 2022-05-20 MED ORDER — ONDANSETRON HCL 4 MG/2ML IJ SOLN
4.0000 mg | Freq: Once | INTRAMUSCULAR | Status: AC
  Administered 2022-05-20: 4 mg via INTRAVENOUS
  Filled 2022-05-20: qty 2

## 2022-05-20 MED ORDER — SODIUM CHLORIDE 0.9 % IV BOLUS
1000.0000 mL | Freq: Once | INTRAVENOUS | Status: AC
  Administered 2022-05-20: 1000 mL via INTRAVENOUS

## 2022-05-20 MED ORDER — ONDANSETRON 4 MG PO TBDP: 4.0000 mg | ORAL_TABLET | Freq: Three times a day (TID) | ORAL | 0 refills | Status: DC | PRN

## 2022-05-20 MED ORDER — IOHEXOL 300 MG/ML  SOLN
100.0000 mL | Freq: Once | INTRAMUSCULAR | Status: AC | PRN
  Administered 2022-05-20: 100 mL via INTRAVENOUS

## 2022-05-20 MED ORDER — AMOXICILLIN-POT CLAVULANATE 875-125 MG PO TABS
1.0000 | ORAL_TABLET | Freq: Once | ORAL | Status: AC
  Administered 2022-05-20: 1 via ORAL
  Filled 2022-05-20: qty 1

## 2022-05-20 MED ORDER — AMOXICILLIN-POT CLAVULANATE 875-125 MG PO TABS: 1.0000 | ORAL_TABLET | Freq: Two times a day (BID) | ORAL | 0 refills | Status: DC

## 2022-05-20 NOTE — ED Notes (Signed)
Patient in CT

## 2022-05-20 NOTE — ED Triage Notes (Signed)
Pt c/o abdominal pain that started this AM. C/o nausea.

## 2022-05-20 NOTE — ED Provider Notes (Signed)
[facility] Provider Note    Event Date/Time   First MD Initiated Contact with Patient 05/20/22 1815     (approximate)  History   Chief Complaint: Abdominal Pain  HPI  Jacob Montes is a 82 y.o. male with a past medical history of DVT, gastric reflux, hypertension, hyperlipidemia, prior CVA, who presents emergency department for nausea and vomiting.  According to the patient since this morning he has been feeling somewhat nauseated with some mild mid abdominal discomfort and vomited/dry heaves several times.  Patient states recent past medical history of cholecystectomy followed by 2 episodes of a retained stone leading to cholangitis.  States he was being overly cautious today and wanted to be evaluated due to this.  Patient denies any significant tenderness or discomfort currently.  No fever.  Physical Exam   Triage Vital Signs: ED Triage Vitals  Enc Vitals Group     BP 05/20/22 1741 (!) 142/53     Pulse Rate 05/20/22 1741 78     Resp 05/20/22 1741 18     Temp 05/20/22 1741 98.4 F (36.9 C)     Temp Source 05/20/22 1741 Oral     SpO2 05/20/22 1741 97 %     Weight 05/20/22 1739 226 lb (102.5 kg)     Height 05/20/22 1739 5\' 6"  (1.676 m)     Head Circumference --      Peak Flow --      Pain Score 05/20/22 1739 0     Pain Loc --      Pain Edu? --      Excl. in GC? --     Most recent vital signs: Vitals:   05/20/22 1741 05/20/22 2100  BP: (!) 142/53 (!) 145/48  Pulse: 78 74  Resp: 18 16  Temp: 98.4 F (36.9 C)   SpO2: 97% 93%    General: Awake, no distress.  CV:  Good peripheral perfusion.  Regular rate and rhythm  Resp:  Normal effort.  Equal breath sounds bilaterally.  Abd:  No distention.  Soft, nontender.  No rebound or guarding.  No significant tenderness identified.  ED Results / Procedures / Treatments   RADIOLOGY  I have reviewed and interpreted the CT images I do not see any obvious obstruction or significant abnormality seen on  my evaluation. Radiology has read the CT scan as possible very early diverticulitis of the sigmoid colon no other finding identified.   MEDICATIONS ORDERED IN ED: Medications  sodium chloride 0.9 % bolus 1,000 mL (1,000 mLs Intravenous New Bag/Given 05/20/22 1933)  ondansetron (ZOFRAN) injection 4 mg (4 mg Intravenous Given 05/20/22 1932)  iohexol (OMNIPAQUE) 300 MG/ML solution 100 mL (100 mLs Intravenous Contrast Given 05/20/22 1951)     IMPRESSION / MDM / ASSESSMENT AND PLAN / ED COURSE  I reviewed the triage vital signs and the nursing notes.  Patient's presentation is most consistent with acute presentation with potential threat to life or bodily function.  Patient presents emergency department for mild periumbilical abdominal discomfort as well as nausea and vomiting since earlier today.  Overall the patient appears well.  After Zofran and fluids patient states he is feeling much better. Patient's lab work today showed his CBC with a normal white blood cell count, reassuring chemistry including normal LFTs with a normal lipase.  Urinalysis shows no concerning findings.  CT scan shows what appears to be early diverticulitis.  I believe this would likely explain the patient's symptoms.  Will place the  patient on Augmentin twice daily as well as Zofran and have the patient follow-up with his doctor.  Patient agreeable to plan of care.  FINAL CLINICAL IMPRESSION(S) / ED DIAGNOSES   Diverticulitis  Rx / DC Orders   Augmentin Zofran  Note:  This document was prepared using Dragon voice recognition software and may include unintentional dictation errors.   Minna Antis, MD 05/20/22 2112

## 2022-05-22 ENCOUNTER — Telehealth: Payer: Self-pay

## 2022-05-22 NOTE — Telephone Encounter (Signed)
     Patient  visit on 05/20/2022  at [facility] was for abdominal pa in.  Have you been able to follow up with your primary care physician? Yes  The patient was or was not able to obtain any needed medicine or equipment. Patient was able to obtain medication.  Are there diet recommendations that you are having difficulty following? No  Patient expresses understanding of discharge instructions and education provided has no other needs at this time. Yes   Sly Parlee [facility]  [facility] Population Health Community Resource Care Guide   [email or web address]  ?? [phone]   Website: [email or web address]

## 2022-05-27 DIAGNOSIS — R051 Acute cough: Secondary | ICD-10-CM | POA: Diagnosis not present

## 2022-05-27 DIAGNOSIS — R509 Fever, unspecified: Secondary | ICD-10-CM | POA: Diagnosis not present

## 2022-05-27 DIAGNOSIS — R059 Cough, unspecified: Secondary | ICD-10-CM | POA: Diagnosis not present

## 2022-05-27 DIAGNOSIS — K5732 Diverticulitis of large intestine without perforation or abscess without bleeding: Secondary | ICD-10-CM | POA: Diagnosis not present

## 2022-05-27 DIAGNOSIS — R5381 Other malaise: Secondary | ICD-10-CM | POA: Diagnosis not present

## 2022-05-28 DIAGNOSIS — K5792 Diverticulitis of intestine, part unspecified, without perforation or abscess without bleeding: Secondary | ICD-10-CM | POA: Diagnosis not present

## 2022-06-07 DIAGNOSIS — K5792 Diverticulitis of intestine, part unspecified, without perforation or abscess without bleeding: Secondary | ICD-10-CM | POA: Diagnosis not present

## 2022-06-08 DIAGNOSIS — R509 Fever, unspecified: Secondary | ICD-10-CM | POA: Diagnosis not present

## 2022-06-08 DIAGNOSIS — R1084 Generalized abdominal pain: Secondary | ICD-10-CM | POA: Diagnosis not present

## 2022-06-09 DIAGNOSIS — K5732 Diverticulitis of large intestine without perforation or abscess without bleeding: Secondary | ICD-10-CM | POA: Diagnosis not present

## 2022-06-09 DIAGNOSIS — K750 Abscess of liver: Secondary | ICD-10-CM | POA: Diagnosis not present

## 2022-06-09 DIAGNOSIS — N183 Chronic kidney disease, stage 3 unspecified: Secondary | ICD-10-CM | POA: Diagnosis not present

## 2022-06-09 DIAGNOSIS — E1122 Type 2 diabetes mellitus with diabetic chronic kidney disease: Secondary | ICD-10-CM | POA: Diagnosis not present

## 2022-06-10 DIAGNOSIS — Z87891 Personal history of nicotine dependence: Secondary | ICD-10-CM | POA: Diagnosis not present

## 2022-06-10 DIAGNOSIS — R509 Fever, unspecified: Secondary | ICD-10-CM | POA: Diagnosis not present

## 2022-06-10 DIAGNOSIS — I2510 Atherosclerotic heart disease of native coronary artery without angina pectoris: Secondary | ICD-10-CM | POA: Diagnosis not present

## 2022-06-10 DIAGNOSIS — K5732 Diverticulitis of large intestine without perforation or abscess without bleeding: Secondary | ICD-10-CM | POA: Diagnosis not present

## 2022-06-10 DIAGNOSIS — Z9049 Acquired absence of other specified parts of digestive tract: Secondary | ICD-10-CM | POA: Diagnosis not present

## 2022-06-10 DIAGNOSIS — K769 Liver disease, unspecified: Secondary | ICD-10-CM | POA: Diagnosis not present

## 2022-06-10 DIAGNOSIS — E785 Hyperlipidemia, unspecified: Secondary | ICD-10-CM | POA: Diagnosis not present

## 2022-06-10 DIAGNOSIS — R197 Diarrhea, unspecified: Secondary | ICD-10-CM | POA: Diagnosis not present

## 2022-06-10 DIAGNOSIS — R109 Unspecified abdominal pain: Secondary | ICD-10-CM | POA: Diagnosis not present

## 2022-06-23 DIAGNOSIS — K5732 Diverticulitis of large intestine without perforation or abscess without bleeding: Secondary | ICD-10-CM | POA: Diagnosis not present

## 2022-06-23 DIAGNOSIS — N183 Chronic kidney disease, stage 3 unspecified: Secondary | ICD-10-CM | POA: Diagnosis not present

## 2022-06-23 DIAGNOSIS — R509 Fever, unspecified: Secondary | ICD-10-CM | POA: Diagnosis not present

## 2022-06-23 DIAGNOSIS — E860 Dehydration: Secondary | ICD-10-CM | POA: Diagnosis not present

## 2022-06-25 DIAGNOSIS — M79674 Pain in right toe(s): Secondary | ICD-10-CM | POA: Diagnosis not present

## 2022-06-25 DIAGNOSIS — B351 Tinea unguium: Secondary | ICD-10-CM | POA: Diagnosis not present

## 2022-06-25 DIAGNOSIS — M79675 Pain in left toe(s): Secondary | ICD-10-CM | POA: Diagnosis not present

## 2022-07-07 DIAGNOSIS — D72829 Elevated white blood cell count, unspecified: Secondary | ICD-10-CM | POA: Diagnosis not present

## 2022-07-07 DIAGNOSIS — K5792 Diverticulitis of intestine, part unspecified, without perforation or abscess without bleeding: Secondary | ICD-10-CM | POA: Diagnosis not present

## 2022-07-07 DIAGNOSIS — R197 Diarrhea, unspecified: Secondary | ICD-10-CM | POA: Diagnosis not present

## 2022-07-08 DIAGNOSIS — R197 Diarrhea, unspecified: Secondary | ICD-10-CM | POA: Diagnosis not present

## 2022-07-08 DIAGNOSIS — D72829 Elevated white blood cell count, unspecified: Secondary | ICD-10-CM | POA: Diagnosis not present

## 2022-07-23 DIAGNOSIS — D72829 Elevated white blood cell count, unspecified: Secondary | ICD-10-CM | POA: Diagnosis not present

## 2022-07-23 DIAGNOSIS — K5792 Diverticulitis of intestine, part unspecified, without perforation or abscess without bleeding: Secondary | ICD-10-CM | POA: Diagnosis not present

## 2022-07-23 DIAGNOSIS — A0472 Enterocolitis due to Clostridium difficile, not specified as recurrent: Secondary | ICD-10-CM | POA: Diagnosis not present

## 2022-07-30 DIAGNOSIS — C49A2 Gastrointestinal stromal tumor of stomach: Secondary | ICD-10-CM | POA: Diagnosis not present

## 2022-07-30 DIAGNOSIS — K8050 Calculus of bile duct without cholangitis or cholecystitis without obstruction: Secondary | ICD-10-CM | POA: Diagnosis not present

## 2022-07-30 DIAGNOSIS — K5792 Diverticulitis of intestine, part unspecified, without perforation or abscess without bleeding: Secondary | ICD-10-CM | POA: Diagnosis not present

## 2022-07-30 DIAGNOSIS — D649 Anemia, unspecified: Secondary | ICD-10-CM | POA: Diagnosis not present

## 2022-07-30 DIAGNOSIS — A498 Other bacterial infections of unspecified site: Secondary | ICD-10-CM | POA: Diagnosis not present

## 2022-07-30 DIAGNOSIS — K8309 Other cholangitis: Secondary | ICD-10-CM | POA: Diagnosis not present

## 2022-08-20 ENCOUNTER — Ambulatory Visit: Payer: Medicare Other | Admitting: Dermatology

## 2022-08-20 ENCOUNTER — Encounter: Payer: Self-pay | Admitting: Dermatology

## 2022-08-20 VITALS — BP 140/60

## 2022-08-20 DIAGNOSIS — L578 Other skin changes due to chronic exposure to nonionizing radiation: Secondary | ICD-10-CM | POA: Diagnosis not present

## 2022-08-20 DIAGNOSIS — Z7189 Other specified counseling: Secondary | ICD-10-CM

## 2022-08-20 DIAGNOSIS — W908XXA Exposure to other nonionizing radiation, initial encounter: Secondary | ICD-10-CM | POA: Diagnosis not present

## 2022-08-20 DIAGNOSIS — L820 Inflamed seborrheic keratosis: Secondary | ICD-10-CM

## 2022-08-20 DIAGNOSIS — L570 Actinic keratosis: Secondary | ICD-10-CM

## 2022-08-20 NOTE — Patient Instructions (Addendum)
Cryotherapy Aftercare  Wash gently with soap and water everyday.   Apply Vaseline and Band-Aid daily until healed.     Due to recent changes in healthcare laws, you may see results of your pathology and/or laboratory studies on MyChart before the doctors have had a chance to review them. We understand that in some cases there may be results that are confusing or concerning to you. Please understand that not all results are received at the same time and often the doctors may need to interpret multiple results in order to provide you with the best plan of care or course of treatment. Therefore, we ask that you please give us 2 business days to thoroughly review all your results before contacting the office for clarification. Should we see a critical lab result, you will be contacted sooner.   If You Need Anything After Your Visit  If you have any questions or concerns for your doctor, please call our main line at [phone] and press option 4 to reach your doctor's medical assistant. If no one answers, please leave a voicemail as directed and we will return your call as soon as possible. Messages left after 4 pm will be answered the following business day.   You may also send us a message via MyChart. We typically respond to MyChart messages within 1-2 business days.  For prescription refills, please ask your pharmacy to contact our office. Our fax number is [phone].  If you have an urgent issue when the clinic is closed that cannot wait until the next business day, you can page your doctor at the number below.    Please note that while we do our best to be available for urgent issues outside of office hours, we are not available 24/7.   If you have an urgent issue and are unable to reach us, you may choose to seek medical care at your doctor's office, retail clinic, urgent care center, or emergency room.  If you have a medical emergency, please immediately call 911 or go to the  emergency department.  Pager Numbers  - Dr. Kowalski: [phone]  - Dr. Moye: [phone]  - Dr. Stewart: [phone]  In the event of inclement weather, please call our main line at [phone] for an update on the status of any delays or closures.  Dermatology Medication Tips: Please keep the boxes that topical medications come in in order to help keep track of the instructions about where and how to use these. Pharmacies typically print the medication instructions only on the boxes and not directly on the medication tubes.   If your medication is too expensive, please contact our office at [phone] option 4 or send us a message through MyChart.   We are unable to tell what your co-pay for medications will be in advance as this is different depending on your insurance coverage. However, we may be able to find a substitute medication at lower cost or fill out paperwork to get insurance to cover a needed medication.   If a prior authorization is required to get your medication covered by your insurance company, please allow us 1-2 business days to complete this process.  Drug prices often vary depending on where the prescription is filled and some pharmacies may offer cheaper prices.  The website www.goodrx.com contains coupons for medications through different pharmacies. The prices here do not account for what the cost may be with help from insurance (it may be cheaper with your insurance), but the website can   give you the price if you did not use any insurance.  - You can print the associated coupon and take it with your prescription to the pharmacy.  - You may also stop by our office during regular business hours and pick up a GoodRx coupon card.  - If you need your prescription sent electronically to a different pharmacy, notify our office through  [facility] MyChart or by phone at [phone] option 4.     Si Usted Necesita Algo Despus de Su Visita  Tambin puede  enviarnos un mensaje a travs de MyChart. Por lo general respondemos a los mensajes de MyChart en el transcurso de 1 a 2 das hbiles.  Para renovar recetas, por favor pida a su farmacia que se ponga en contacto con nuestra oficina. Nuestro nmero de fax es el [phone].  Si tiene un asunto urgente cuando la clnica est cerrada y que no puede esperar hasta el siguiente da hbil, puede llamar/localizar a su doctor(a) al nmero que aparece a continuacin.   Por favor, tenga en cuenta que aunque hacemos todo lo posible para estar disponibles para asuntos urgentes fuera del horario de oficina, no estamos disponibles las 24 horas del da, los 7 das de la semana.   Si tiene un problema urgente y no puede comunicarse con nosotros, puede optar por buscar atencin mdica  en el consultorio de su doctor(a), en una clnica privada, en un centro de atencin urgente o en una sala de emergencias.  Si tiene una emergencia mdica, por favor llame inmediatamente al 911 o vaya a la sala de emergencias.  Nmeros de bper  - Dr. Kowalski: [phone]  - Dra. Moye: [phone]  - Dra. Stewart: [phone]  En caso de inclemencias del tiempo, por favor llame a nuestra lnea principal al [phone] para una actualizacin sobre el estado de cualquier retraso o cierre.  Consejos para la medicacin en dermatologa: Por favor, guarde las cajas en las que vienen los medicamentos de uso tpico para ayudarle a seguir las instrucciones sobre dnde y cmo usarlos. Las farmacias generalmente imprimen las instrucciones del medicamento slo en las cajas y no directamente en los tubos del medicamento.   Si su medicamento es muy caro, por favor, pngase en contacto con nuestra oficina llamando al [phone] y presione la opcin 4 o envenos un mensaje a travs de MyChart.   No podemos decirle cul ser su copago por los medicamentos por adelantado ya que esto es diferente dependiendo de la cobertura de su seguro.  Sin embargo, es posible que podamos encontrar un medicamento sustituto a menor costo o llenar un formulario para que el seguro cubra el medicamento que se considera necesario.   Si se requiere una autorizacin previa para que su compaa de seguros cubra su medicamento, por favor permtanos de 1 a 2 das hbiles para completar este proceso.  Los precios de los medicamentos varan con frecuencia dependiendo del lugar de dnde se surte la receta y alguna farmacias pueden ofrecer precios ms baratos.  El sitio web www.goodrx.com tiene cupones para medicamentos de diferentes farmacias. Los precios aqu no tienen en cuenta lo que podra costar con la ayuda del seguro (puede ser ms barato con su seguro), pero el sitio web puede darle el precio si no utiliz ningn seguro.  - Puede imprimir el cupn correspondiente y llevarlo con su receta a la farmacia.  - Tambin puede pasar por nuestra oficina durante el horario de atencin regular y recoger una tarjeta de cupones de GoodRx.  -   Si necesita que su receta se enve electrnicamente a una farmacia diferente, informe a nuestra oficina a travs de MyChart de [facility] o por telfono llamando al [phone] y presione la opcin 4.  

## 2022-08-20 NOTE — Progress Notes (Addendum)
Follow-Up Visit   Subjective  Jacob Montes is a 82 y.o. male who presents for the following: AK 34m f/u, put had small reaction with 5FU/Calcipotriene cream to scalp bid for 1wk The patient has spots, moles and lesions to be evaluated, some may be new or changing and the patient may have concern these could be cancer.  The following portions of the chart were reviewed this encounter and updated as appropriate: medications, allergies, medical history  Review of Systems:  No other skin or systemic complaints except as noted in HPI or Assessment and Plan.  Objective  Well appearing patient in no apparent distress; mood and affect are within normal limits. A focused examination was performed of the following areas: Face, scalp, ears, arms Relevant exam findings are noted in the Assessment and Plan.  L lower eyelid margin x 1, Scalp, face, ears x 18 (18) Pink scaly macules  L face x 3, arms x 2 (5) Stuck on waxy paps with erythema    Assessment & Plan   AK (actinic keratosis) (19) L lower eyelid margin x 1; Scalp, face, ears x 18 (18)  ACTINIC DAMAGE WITH PRECANCEROUS ACTINIC KERATOSES Counseling for Topical Chemotherapy Management: Patient exhibits: - Severe, confluent actinic changes with pre-cancerous actinic keratoses that is secondary to cumulative UV radiation exposure over time - Condition that is severe; chronic, not at goal. - diffuse scaly erythematous macules and papules with underlying dyspigmentation - Discussed Prescription "Field Treatment" topical Chemotherapy for Severe, Chronic Confluent Actinic Changes with Pre-Cancerous Actinic Keratoses Field treatment involves treatment of an entire area of skin that has confluent Actinic Changes (Sun/ Ultraviolet light damage) and PreCancerous Actinic Keratoses by method of PhotoDynamic Therapy (PDT) and/or prescription Topical Chemotherapy agents such as 5-fluorouracil, 5-fluorouracil/calcipotriene, and/or imiquimod.  The  purpose is to decrease the number of clinically evident and subclinical PreCancerous lesions to prevent progression to development of skin cancer by chemically destroying early precancer changes that may or may not be visible.  It has been shown to reduce the risk of developing skin cancer in the treated area. As a result of treatment, redness, scaling, crusting, and open sores may occur during treatment course. One or more than one of these methods may be used and may have to be used several times to control, suppress and eliminate the PreCancerous changes. Discussed treatment course, expected reaction, and possible side effects. - Recommend daily broad spectrum sunscreen SPF 30+ to sun-exposed areas, reapply every 2 hours as needed.  - Staying in the shade or wearing long sleeves, sun glasses (UVA+UVB protection) and wide brim hats (4-inch brim around the entire circumference of the hat) are also recommended. - Call for new or changing lesions.  - May consider Red Light treatment in future   Recheck R sup helix on f/u  Destruction of lesion - L lower eyelid margin x 1; Scalp, face, ears x 18 (18) Complexity: simple   Destruction method: cryotherapy   Informed consent: discussed and consent obtained   Timeout:  patient name, date of birth, surgical site, and procedure verified Lesion destroyed using liquid nitrogen: Yes   Region frozen until ice ball extended beyond lesion: Yes   Outcome: patient tolerated procedure well with no complications   Post-procedure details: wound care instructions given    Inflamed seborrheic keratosis (5) L face x 3, arms x 2  Symptomatic, irritating, patient would like treated.   Destruction of lesion - L face x 3, arms x 2 (5) Complexity: simple  Destruction method: cryotherapy   Informed consent: discussed and consent obtained   Timeout:  patient name, date of birth, surgical site, and procedure verified Lesion destroyed using liquid nitrogen: Yes    Region frozen until ice ball extended beyond lesion: Yes   Outcome: patient tolerated procedure well with no complications   Post-procedure details: wound care instructions given     Return in about 7 months (around 03/23/2023) for AK f/u, recheck Ak R sup helix.  I, Ardis Rowan, RMA, am acting as scribe for Armida Sans, MD .  Documentation: I have reviewed the above documentation for accuracy and completeness, and I agree with the above.  Armida Sans, MD

## 2022-08-21 ENCOUNTER — Encounter: Payer: Self-pay | Admitting: Dermatology

## 2022-08-25 ENCOUNTER — Encounter: Payer: Self-pay | Admitting: *Deleted

## 2022-08-31 ENCOUNTER — Encounter: Payer: Self-pay | Admitting: *Deleted

## 2022-09-01 ENCOUNTER — Ambulatory Visit: Payer: Medicare Other | Admitting: Anesthesiology

## 2022-09-01 ENCOUNTER — Encounter: Payer: Self-pay | Admitting: *Deleted

## 2022-09-01 ENCOUNTER — Ambulatory Visit
Admission: RE | Admit: 2022-09-01 | Discharge: 2022-09-01 | Disposition: A | Discharge status: Home / Self Care | Payer: Medicare Other | Attending: Gastroenterology | Admitting: Gastroenterology

## 2022-09-01 ENCOUNTER — Encounter
Admission: RE | Disposition: A | Discharge status: Home / Self Care | Payer: Self-pay | Source: Home / Self Care | Attending: Gastroenterology

## 2022-09-01 DIAGNOSIS — E785 Hyperlipidemia, unspecified: Secondary | ICD-10-CM | POA: Insufficient documentation

## 2022-09-01 DIAGNOSIS — Z951 Presence of aortocoronary bypass graft: Secondary | ICD-10-CM | POA: Diagnosis not present

## 2022-09-01 DIAGNOSIS — K219 Gastro-esophageal reflux disease without esophagitis: Secondary | ICD-10-CM | POA: Diagnosis not present

## 2022-09-01 DIAGNOSIS — I2510 Atherosclerotic heart disease of native coronary artery without angina pectoris: Secondary | ICD-10-CM | POA: Diagnosis not present

## 2022-09-01 DIAGNOSIS — I10 Essential (primary) hypertension: Secondary | ICD-10-CM | POA: Insufficient documentation

## 2022-09-01 DIAGNOSIS — M159 Polyosteoarthritis, unspecified: Secondary | ICD-10-CM | POA: Insufficient documentation

## 2022-09-01 DIAGNOSIS — Z86718 Personal history of other venous thrombosis and embolism: Secondary | ICD-10-CM | POA: Diagnosis not present

## 2022-09-01 DIAGNOSIS — D123 Benign neoplasm of transverse colon: Secondary | ICD-10-CM | POA: Insufficient documentation

## 2022-09-01 DIAGNOSIS — G4730 Sleep apnea, unspecified: Secondary | ICD-10-CM | POA: Insufficient documentation

## 2022-09-01 DIAGNOSIS — Z7982 Long term (current) use of aspirin: Secondary | ICD-10-CM | POA: Insufficient documentation

## 2022-09-01 DIAGNOSIS — Z85828 Personal history of other malignant neoplasm of skin: Secondary | ICD-10-CM | POA: Diagnosis not present

## 2022-09-01 DIAGNOSIS — K640 First degree hemorrhoids: Secondary | ICD-10-CM | POA: Diagnosis not present

## 2022-09-01 DIAGNOSIS — K649 Unspecified hemorrhoids: Secondary | ICD-10-CM | POA: Diagnosis not present

## 2022-09-01 DIAGNOSIS — Z952 Presence of prosthetic heart valve: Secondary | ICD-10-CM | POA: Insufficient documentation

## 2022-09-01 DIAGNOSIS — I350 Nonrheumatic aortic (valve) stenosis: Secondary | ICD-10-CM | POA: Insufficient documentation

## 2022-09-01 DIAGNOSIS — Z86711 Personal history of pulmonary embolism: Secondary | ICD-10-CM | POA: Insufficient documentation

## 2022-09-01 DIAGNOSIS — K5730 Diverticulosis of large intestine without perforation or abscess without bleeding: Secondary | ICD-10-CM | POA: Diagnosis not present

## 2022-09-01 DIAGNOSIS — Z87891 Personal history of nicotine dependence: Secondary | ICD-10-CM | POA: Diagnosis not present

## 2022-09-01 DIAGNOSIS — Z79899 Other long term (current) drug therapy: Secondary | ICD-10-CM | POA: Diagnosis not present

## 2022-09-01 DIAGNOSIS — K635 Polyp of colon: Secondary | ICD-10-CM | POA: Diagnosis not present

## 2022-09-01 HISTORY — PX: POLYPECTOMY: SHX5525

## 2022-09-01 HISTORY — PX: COLONOSCOPY WITH PROPOFOL: SHX5780

## 2022-09-01 SURGERY — COLONOSCOPY WITH PROPOFOL
Anesthesia: General

## 2022-09-01 MED ORDER — SODIUM CHLORIDE 0.9 % IV SOLN: INTRAVENOUS | Status: DC

## 2022-09-01 MED ORDER — LIDOCAINE HCL (CARDIAC) PF 100 MG/5ML IV SOSY
PREFILLED_SYRINGE | INTRAVENOUS | Status: DC | PRN
  Administered 2022-09-01: 50 mg via INTRAVENOUS

## 2022-09-01 MED ORDER — PROPOFOL 500 MG/50ML IV EMUL
INTRAVENOUS | Status: DC | PRN
  Administered 2022-09-01: 100 ug/kg/min via INTRAVENOUS

## 2022-09-01 MED ORDER — DEXMEDETOMIDINE HCL IN NACL 80 MCG/20ML IV SOLN
INTRAVENOUS | Status: DC | PRN
  Administered 2022-09-01: 12 ug via INTRAVENOUS

## 2022-09-01 MED ORDER — PROPOFOL 10 MG/ML IV BOLUS
INTRAVENOUS | Status: DC | PRN
  Administered 2022-09-01: 60 mg via INTRAVENOUS

## 2022-09-01 MED ORDER — LIDOCAINE HCL (PF) 2 % IJ SOLN
INTRAMUSCULAR | Status: AC
  Filled 2022-09-01: qty 5

## 2022-09-01 MED ORDER — PROPOFOL 10 MG/ML IV BOLUS
INTRAVENOUS | Status: AC
  Filled 2022-09-01: qty 20

## 2022-09-01 MED ORDER — DEXMEDETOMIDINE HCL IN NACL 80 MCG/20ML IV SOLN
INTRAVENOUS | Status: AC
  Filled 2022-09-01: qty 20

## 2022-09-01 NOTE — Transfer of Care (Signed)
Immediate Anesthesia Transfer of Care Note  Patient: Jacob Montes  Procedure(s) Performed: COLONOSCOPY WITH PROPOFOL POLYPECTOMY  Patient Location: PACU  Anesthesia Type:General  Level of Consciousness: drowsy and responds to stimulation  Airway & Oxygen Therapy: Patient Spontanous Breathing  Post-op Assessment: Report given to RN and Post -op Vital signs reviewed and stable  Post vital signs: Reviewed and stable  Last Vitals:  Vitals Value Taken Time  BP 94/50 09/01/22 1127  Temp 36.3 C 09/01/22 1126  Pulse 61 09/01/22 1129  Resp 15 09/01/22 1129  SpO2 98 % 09/01/22 1129  Vitals shown include unfiled device data.  Last Pain:  Vitals:   09/01/22 1126  TempSrc: Temporal  PainSc: Asleep         Complications: No notable events documented.

## 2022-09-01 NOTE — H&P (Signed)
Outpatient short stay form Pre-procedure 09/01/2022  Regis Bill, MD  Primary Physician: Bosie Clos, MD  Reason for visit:  History of diverticulitis  History of present illness:    82 y/o gentleman with history of hypertension, TAVR, CABG, and sleep apnea here for colonoscopy for history of diverticulitis earlier this year. Symptoms have resolved. No blood thinners. No significant abdominal surgeries. No family history of GI malignancies.    Current Facility-Administered Medications:    0.9 %  sodium chloride infusion, , Intravenous, Continuous, Kolleen Ochsner, Rossie Muskrat, MD, Last Rate: 20 mL/hr at 09/01/22 0948, New Bag at 09/01/22 0948  Medications Prior to Admission  Medication Sig Dispense Refill Last Dose   amLODipine (NORVASC) 10 MG tablet Take 1 tablet by mouth once daily 90 tablet 3 09/01/2022 at 0700   amoxicillin-clavulanate (AUGMENTIN) 875-125 MG tablet Take 1 tablet by mouth 2 (two) times daily. (Patient not taking: Reported on 08/31/2022) 20 tablet 0 Completed Course   aspirin 81 MG chewable tablet Chew 81 mg by mouth daily.      docusate sodium (COLACE) 250 MG capsule Take 250 mg by mouth daily as needed for constipation.       furosemide (LASIX) 40 MG tablet Take 1 tablet (40 mg) by mouth daily 5 days per week 75 tablet 3    montelukast (SINGULAIR) 10 MG tablet Take 1 tablet (10 mg total) by mouth at bedtime as needed (allergies). 30 tablet 3    Multiple Vitamin (MULTIVITAMIN) tablet Take 1 tablet by mouth daily.      omeprazole (PRILOSEC) 20 MG capsule Take 20 mg by mouth daily. As needed      ondansetron (ZOFRAN-ODT) 4 MG disintegrating tablet Take 1 tablet (4 mg total) by mouth every 8 (eight) hours as needed for nausea or vomiting. 20 tablet 0    potassium chloride (KLOR-CON) 10 MEQ tablet Take 1 tablet (10 mEq) 5 days per week. Take with lasix 75 tablet 3    rosuvastatin (CRESTOR) 20 MG tablet Take 1 tablet (20 mg total) by mouth daily. 90 tablet 0     tamsulosin (FLOMAX) 0.4 MG CAPS capsule Take 1 capsule by mouth once daily 90 capsule 0      Allergies  Allergen Reactions   Atorvastatin Other (See Comments)    aches   Ramipril Other (See Comments)    cough     Past Medical History:  Diagnosis Date   Actinic keratosis    Aortic stenosis    a. s/p TAVR 03/2017   CAD (coronary artery disease)    a. s/p 3-v CABG in 07/2007   Carotid artery disease (HCC)    a. 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    Physical Exam  Gen: Alert, oriented. Appears stated age.  HEENT: PERRLA. Lungs: No respiratory distress CV: RRR Abd: soft, benign, no masses Ext: No edema    Planned procedures: Proceed with colonoscopy. The patient understands the nature of the planned procedure, indications, risks, alternatives and potential complications including but not limited to bleeding, infection, perforation, damage to internal organs  and possible oversedation/side effects from anesthesia. The patient agrees and gives consent to proceed.  Please refer to procedure notes for findings, recommendations and patient disposition/instructions.     Regis Bill, MD [facility]

## 2022-09-01 NOTE — Interval H&P Note (Signed)
History and Physical Interval Note:  09/01/2022 10:49 AM  Jacob Montes  has presented today for surgery, with the diagnosis of Diverticulitis.  The various methods of treatment have been discussed with the patient and family. After consideration of risks, benefits and other options for treatment, the patient has consented to  Procedure(s): COLONOSCOPY WITH PROPOFOL (N/A) as a surgical intervention.  The patient's history has been reviewed, patient examined, no change in status, stable for surgery.  I have reviewed the patient's chart and labs.  Questions were answered to the patient's satisfaction.     Regis Bill  Ok to proceed with colonoscopy

## 2022-09-01 NOTE — Op Note (Signed)
[facility]astroenter[facility] Patient Name: Jacob Montes Procedure Date: 09/01/2022 11:00 AM MRN: [id number] Account #: [id number] Date of Birth: 02-04-1941 Admit Type: Outpatient Age: 82 Room: [facility] ENDO ROOM 1 Gender: Male Note Status: Finalized Instrument Name: Colonoscope [id number] Procedure:             Colonoscopy Indications:           Follow-up of diverticulitis Providers:             Eather Colas MD, MD Referring MD:          Ferdinand Lango. Sullivan Lone, MD (Referring MD) Medicines:             Monitored Anesthesia Care Complications:         No immediate complications. Estimated blood loss:                         Minimal. Procedure:             Pre-Anesthesia Assessment:                        - Prior to the procedure, a History and Physical was                         performed, and patient medications and allergies were                         reviewed. The patient is competent. The risks and                         benefits of the procedure and the sedation options and                         risks were discussed with the patient. All questions                         were answered and informed consent was obtained.                         Patient identification and proposed procedure were                         verified by the physician, the nurse, the                         anesthesiologist, the anesthetist and the technician                         in the endoscopy suite. Mental Status Examination:                         alert and oriented. Airway Examination: normal                         oropharyngeal airway and neck mobility. Respiratory                         Examination: clear to auscultation. CV Examination:  normal. Prophylactic Antibiotics: The patient does not                         require prophylactic antibiotics. Prior                         Anticoagulants: The patient has taken no anticoagulant                          or antiplatelet agents. ASA Grade Assessment: III - A                         patient with severe systemic disease. After reviewing                         the risks and benefits, the patient was deemed in                         satisfactory condition to undergo the procedure. The                         anesthesia plan was to use monitored anesthesia care                         (MAC). Immediately prior to administration of                         medications, the patient was re-assessed for adequacy                         to receive sedatives. The heart rate, respiratory                         rate, oxygen saturations, blood pressure, adequacy of                         pulmonary ventilation, and response to care were                         monitored throughout the procedure. The physical                         status of the patient was re-assessed after the                         procedure.                        After obtaining informed consent, the colonoscope was                         passed under direct vision. Throughout the procedure,                         the patient's blood pressure, pulse, and oxygen                         saturations were monitored continuously. The  Colonoscope was introduced through the anus and                         advanced to the the terminal ileum. The colonoscopy                         was performed without difficulty. The patient                         tolerated the procedure well. The quality of the bowel                         preparation was good. The terminal ileum, ileocecal                         valve, appendiceal orifice, and rectum were                         photographed. Findings:      The perianal and digital rectal examinations were normal.      The terminal ileum appeared normal.      Two sessile polyps were found in the proximal transverse colon. The       polyps were 2 to 3 mm in size. These  polyps were removed with a cold       snare. Resection and retrieval were complete. Estimated blood loss was       minimal.      Multiple large-mouthed and small-mouthed diverticula were found in the       sigmoid colon.      Internal hemorrhoids were found during retroflexion. The hemorrhoids       were Grade I (internal hemorrhoids that do not prolapse).      The exam was otherwise without abnormality on direct and retroflexion       views. Impression:            - The examined portion of the ileum was normal.                        - Two 2 to 3 mm polyps in the proximal transverse                         colon, removed with a cold snare. Resected and                         retrieved.                        - Diverticulosis in the sigmoid colon.                        - Internal hemorrhoids.                        - The examination was otherwise normal on direct and                         retroflexion views. Recommendation:        - Discharge patient to home.                        -  Resume previous diet.                        - Continue present medications.                        - Await pathology results.                        - Repeat colonoscopy is not recommended due to current                         age (61 years or older) for surveillance.                        - Return to referring physician as previously                         scheduled. Procedure Code(s):     --- Professional ---                        [phone], Colonoscopy, flexible; with removal of                         tumor(s), polyp(s), or other lesion(s) by snare                         technique Diagnosis Code(s):     --- Professional ---                        K64.0, First degree hemorrhoids                        D12.3, Benign neoplasm of transverse colon (hepatic                         flexure or splenic flexure)                        K57.32, Diverticulitis of large intestine without                          perforation or abscess without bleeding                        K57.30, Diverticulosis of large intestine without                         perforation or abscess without bleeding CPT copyright 2022 American [employer]. All rights reserved. The codes documented in this report are preliminary and upon coder review may  be revised to meet current compliance requirements. Eather Colas MD, MD 09/01/2022 11:26:58 AM Number of Addenda: 0 Note Initiated On: 09/01/2022 11:00 AM Scope Withdrawal Time: 0 hours 7 minutes 59 seconds  Total Procedure Duration: 0 hours 15 minutes 12 seconds  Estimated Blood Loss:  Estimated blood loss was minimal.      [facility]

## 2022-09-01 NOTE — Anesthesia Preprocedure Evaluation (Signed)
Anesthesia Evaluation  Patient identified by MRN, date of birth, ID band Patient awake    Reviewed: Allergy & Precautions, NPO status , Patient's Chart, lab work & pertinent test results  History of Anesthesia Complications Negative for: history of anesthetic complications  Airway Mallampati: III  TM Distance: <3 FB Neck ROM: full    Dental  (+) Chipped, Poor Dentition, Missing   Pulmonary neg shortness of breath, sleep apnea , neg COPD, neg recent URI, former smoker   Pulmonary exam normal        Cardiovascular Exercise Tolerance: Good hypertension, (-) angina + CAD and + CABG  (-) Past MI and (-) Cardiac Stents Normal cardiovascular exam(-) dysrhythmias + Valvular Problems/Murmurs (TAVR)      Neuro/Psych neg Seizures CVA  negative psych ROS   GI/Hepatic Neg liver ROS,GERD  Controlled,,  Endo/Other  negative endocrine ROS    Renal/GU Renal disease     Musculoskeletal   Abdominal   Peds  Hematology negative hematology ROS (+)   Anesthesia Other Findings Past Medical History: No date: Aortic stenosis     Comment:  a. s/p TAVR 03/2017 No date: CAD (coronary artery disease)     Comment:  a. s/p 3-v CABG in 07/2007 No date: Carotid artery disease (HCC)     Comment:  a. s/p left sided CEA complicated by RLN parlaysis  No date: Cataract 08/2010: DVT (deep venous thrombosis) (HCC) 11/16/2018: Generalized osteoarthritis No date: GERD (gastroesophageal reflux disease) No date: HLD (hyperlipidemia) 10/15/2015: Hyperlipidemia No date: Hypertension 11/16/2018: Nephrolithiasis 08/2010: Pulmonary embolism (HCC) No date: Skin cancer No date: Sleep apnea No date: Stroke [facility])  Past Surgical History: 03/08/2007: Carotid surgery; Left 02/16/2018: CHOLECYSTECTOMY 11/18/2018: ERCP; N/A     Comment:  Procedure: ENDOSCOPIC RETROGRADE               CHOLANGIOPANCREATOGRAPHY (ERCP);  Surgeon: Midge Minium,               MD;   Location[facility] ENDOSCOPY;  Service: Endoscopy;                Laterality: N/A; 07/2007: Heart Bypass; N/A 06/03/2009: left shoulder surgery; Left 07/18/2007: RIGHT AND LEFT HEART CATH 2003: right shoulder surgery 11/21/2018: TEE WITHOUT CARDIOVERSION; N/A     Comment:  Procedure: TRANSESOPHAGEAL ECHOCARDIOGRAM (TEE);                Surgeon: Iran Ouch, MD;  Location: [facility] ORS;                Service: Cardiovascular;  Laterality: N/A; No date: TVAR  BMI    Body Mass Index: 37.28 kg/m      Reproductive/Obstetrics negative OB ROS                             Anesthesia Physical Anesthesia Plan  ASA: 3  Anesthesia Plan: General   Post-op Pain Management:    Induction: Intravenous  PONV Risk Score and Plan: 2 and Treatment may vary due to age or medical condition, Propofol infusion and TIVA  Airway Management Planned: Natural Airway and Nasal Cannula  Additional Equipment:   Intra-op Plan:   Post-operative Plan:   Informed Consent: I have reviewed the patients History and Physical, chart, labs and discussed the procedure including the risks, benefits and alternatives for the proposed anesthesia with the patient or authorized representative who has indicated his/her understanding and acceptance.     Dental Advisory Given  Plan Discussed with: Anesthesiologist, CRNA and Surgeon  Anesthesia Plan Comments:         Anesthesia Quick Evaluation

## 2022-09-02 ENCOUNTER — Encounter: Payer: Self-pay | Admitting: Gastroenterology

## 2022-09-02 NOTE — Anesthesia Postprocedure Evaluation (Signed)
Anesthesia Post Note  Patient: Jacob Montes  Procedure(s) Performed: COLONOSCOPY WITH PROPOFOL POLYPECTOMY  Patient location during evaluation: Endoscopy Anesthesia Type: General Level of consciousness: awake and alert Pain management: pain level controlled Vital Signs Assessment: post-procedure vital signs reviewed and stable Respiratory status: spontaneous breathing, nonlabored ventilation, respiratory function stable and patient connected to nasal cannula oxygen Cardiovascular status: blood pressure returned to baseline and stable Postop Assessment: no apparent nausea or vomiting Anesthetic complications: no   No notable events documented.   Last Vitals:  Vitals:   09/01/22 1146 09/01/22 1155  BP: (!) 123/45 (!) 136/45  Pulse:    Resp:    Temp:    SpO2:      Last Pain:  Vitals:   09/02/22 0756  TempSrc:   PainSc: 0-No pain                 Lenard Simmer

## 2022-09-07 NOTE — Progress Notes (Unsigned)
Cardiology Office Note  Date:  09/08/2022   ID:  Jacob Montes, DOB [birth year], MRN [id number]  PCP:  Jacob Clos, MD   Chief Complaint  Patient presents with   12 month follow up     "Doing well." Patient c/o swelling in legs with more on the left. Medications reviewed by the patient verbally.     HPI:  [name] is a 82--year-old gentleman with coronary artery disease, bypass surgery three-vessel June 2009,  aortic valve stenosis with TAVR July 2018,  PAD, carotid stenosis with carotid endarterectomy 2009,  DVT/PE 2012,  prior smoker,  sleep apnea, not on CPAP Seen in the hospital October 2020  with abdominal pain, Rigors, chills fever  Common bile duct stone 8 mm with transaminitis , had ERCP Demand ischemia in the setting of above Who presents for f/u of her coronary disease, aortic valve prosthesis/TAVR  Last seen by myself in clinic July 2023 In follow-up today reports he is doing well Blood pressure measurements at home well-controlled, typically 120 up to 130 systolic  Denies any chest pain concerning for angina No regular exercise program Denies significant shortness of breath on exertion, Reports having mild increased leg swelling on the left, site of prior DVT in 2012  Echocardiogram June 2023, TAVR valve mean gradient 23 mmHg  EKG personally reviewed by myself on todays visit EKG Interpretation Date/Time:  Tuesday September 08 2022 12:04:31 EDT Ventricular Rate:  59 PR Interval:  184 QRS Duration:  140 QT Interval:  438 QTC Calculation: 433 R Axis:   -28  Text Interpretation: Sinus bradycardia Non-specific intra-ventricular conduction block Minimal voltage criteria for LVH, may be normal variant ( Cornell product ) When compared with ECG of 13-Jul-2021 20:24, PREVIOUS ECG IS PRESENT Confirmed by Julien Nordmann [phone]) on 09/08/2022 12:23:26 PM   Other past medical hx Gall stone last month, 07/2021, with sepsis Treated with ABX, ID assisted with  antibiotics 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  Has more procedure scheduled, EUS PICC line pulled out last week, no chills fever since that time  TEE October 2020, Ejection fraction 60 to 65%, well-functioning aortic valve prosthesis trivial regurg  Lab work reviewed HBA1C 5.6 Total chol 133  Last heart catheterization November 2018   PMH:   has a past medical history of Actinic keratosis, Aortic stenosis, CAD (coronary artery disease), Carotid artery disease (HCC), Cataract, DVT (deep venous thrombosis) (HCC) (08/2010), Generalized osteoarthritis (11/16/2018), GERD (gastroesophageal reflux disease), HLD (hyperlipidemia), Hyperlipidemia (10/15/2015), Hypertension, Nephrolithiasis (11/16/2018), Pulmonary embolism (HCC) (08/2010), Skin cancer, Sleep apnea, and Stroke (HCC).  PSH:    Past Surgical History:  Procedure Laterality Date   CARDIAC CATHETERIZATION     Carotid surgery Left 03/08/2007   CHOLECYSTECTOMY  02/16/2018   COLONOSCOPY WITH PROPOFOL N/A 09/01/2022   Procedure: COLONOSCOPY WITH PROPOFOL;  Surgeon: Regis Bill, MD;  Location: [facility] ENDOSCOPY;  Service: Endoscopy;  Laterality: N/A;   ENDOSCOPIC RETROGRADE CHOLANGIOPANCREATOGRAPHY (ERCP) WITH PROPOFOL N/A 07/10/2021   Procedure: ENDOSCOPIC RETROGRADE CHOLANGIOPANCREATOGRAPHY (ERCP) WITH PROPOFOL;  Surgeon: Midge Minium, MD;  Location: [facility] ENDOSCOPY;  Service: Endoscopy;  Laterality: N/A;   ERCP N/A 11/18/2018   Procedure: ENDOSCOPIC RETROGRADE CHOLANGIOPANCREATOGRAPHY (ERCP);  Surgeon: Midge Minium, MD;  Location: [facility] ENDOSCOPY;  Service: Endoscopy;  Laterality: N/A;   EUS N/A 08/21/2021   Procedure: UPPER ENDOSCOPIC ULTRASOUND (EUS) LINEAR;  Surgeon: Bearl Mulberry, MD;  Location: [facility] ENDOSCOPY;  Service: Gastroenterology;  Laterality: N/A;  [employer]  Heart Bypass N/A 07/2007   left shoulder surgery Left 06/03/2009   POLYPECTOMY  09/01/2022    Procedure: POLYPECTOMY;  Surgeon: Regis Bill, MD;  Location: [facility] ENDOSCOPY;  Service: Endoscopy;;   RIGHT AND LEFT HEART CATH  07/18/2007   right shoulder surgery  2003   TEE WITHOUT CARDIOVERSION N/A 11/21/2018   Procedure: TRANSESOPHAGEAL ECHOCARDIOGRAM (TEE);  Surgeon: Iran Ouch, MD;  Location: [facility] ORS;  Service: Cardiovascular;  Laterality: N/A;   TVAR      Current Outpatient Medications  Medication Sig Dispense Refill   amLODipine (NORVASC) 10 MG tablet Take 1 tablet by mouth once daily 90 tablet 3   aspirin 81 MG chewable tablet Chew 81 mg by mouth daily.     docusate sodium (COLACE) 250 MG capsule Take 250 mg by mouth daily as needed for constipation.      furosemide (LASIX) 40 MG tablet Take 1 tablet (40 mg) by mouth daily 5 days per week 75 tablet 3   montelukast (SINGULAIR) 10 MG tablet Take 1 tablet (10 mg total) by mouth at bedtime as needed (allergies). 30 tablet 3   Multiple Vitamin (MULTIVITAMIN) tablet Take 1 tablet by mouth daily.     omeprazole (PRILOSEC) 20 MG capsule Take 20 mg by mouth daily. As needed     ondansetron (ZOFRAN-ODT) 4 MG disintegrating tablet Take 1 tablet (4 mg total) by mouth every 8 (eight) hours as needed for nausea or vomiting. 20 tablet 0   potassium chloride (KLOR-CON) 10 MEQ tablet Take 1 tablet (10 mEq) 5 days per week. Take with lasix 75 tablet 3   rosuvastatin (CRESTOR) 20 MG tablet Take 1 tablet (20 mg total) by mouth daily. 90 tablet 0   tamsulosin (FLOMAX) 0.4 MG CAPS capsule Take 1 capsule by mouth once daily 90 capsule 0   amoxicillin-clavulanate (AUGMENTIN) 875-125 MG tablet Take 1 tablet by mouth 2 (two) times daily. (Patient not taking: Reported on 08/31/2022) 20 tablet 0   No current facility-administered medications for this visit.    Allergies:   Atorvastatin and Ramipril   Social History:  The patient  reports that he quit smoking about 45 years ago. His smoking use included cigarettes. He has never used  smokeless tobacco. He reports that he does not drink alcohol and does not use drugs.   Family History:   family history includes Hypertension in his mother; Lung cancer in his father; Non-Hodgkin's lymphoma in his brother; Stroke in his mother.    Review of Systems: Review of Systems  Constitutional: Negative.   HENT: Negative.    Respiratory: Negative.    Cardiovascular: Negative.   Gastrointestinal: Negative.   Musculoskeletal: Negative.   Neurological: Negative.   Psychiatric/Behavioral: Negative.    All other systems reviewed and are negative.   PHYSICAL EXAM: VS:  BP (!) 120/48 (BP Location: Left Arm, Patient Position: Sitting, Cuff Size: Normal)   Pulse (!) 59   Ht 5\' 6"  (1.676 m)   Wt 207 lb 6 oz (94.1 kg)   SpO2 98%   BMI 33.47 kg/m  , BMI Body mass index is 33.47 kg/m. Constitutional:  oriented to person, place, and time. No distress.  HENT:  Head: Grossly normal Eyes:  no discharge. No scleral icterus.  Neck: No JVD, no carotid bruits  Cardiovascular: Regular rate and rhythm, 1/6 systolic ejection murmur right sternal border Pulmonary/Chest: Clear to auscultation bilaterally, no wheezes or rails Abdominal: Soft.  no distension.  no tenderness.  Musculoskeletal: Normal range of  motion Neurological:  normal muscle tone. Coordination normal. No atrophy Skin: Skin warm and dry Psychiatric: normal affect, pleasant   Recent Labs: 05/20/2022: ALT 27; BUN 16; Creatinine, Ser 0.82; Hemoglobin 13.9; Platelets 107; Potassium 4.1; Sodium 137    Lipid Panel Lab Results  Component Value Date   CHOL 133 10/07/2021   HDL 43 10/07/2021   LDLCALC 67 10/07/2021   TRIG 133 10/07/2021      Wt Readings from Last 3 Encounters:  09/08/22 207 lb 6 oz (94.1 kg)  09/01/22 203 lb (92.1 kg)  05/20/22 226 lb (102.5 kg)     ASSESSMENT AND PLAN:  Problem List Items Addressed This Visit       Cardiology Problems   Benign essential hypertension   Hyperlipidemia   Aortic  stenosis   Relevant Orders   EKG 12-Lead (Completed)   Paroxysmal tachycardia (HCC) - Primary   Relevant Orders   EKG 12-Lead (Completed)   Carotid stenosis, bilateral   Other Visit Diagnoses     Coronary artery disease of native artery of native heart with stable angina pectoris (HCC)       Relevant Orders   EKG 12-Lead (Completed)      Coronary artery disease with stable angina Currently with no symptoms of angina. No further workup at this time. Continue current medication regimen.  Paroxysmal tachycardia Heart rate well-controlled, recordings at home typically 60 to 70 bpm  Aortic valve disease, stenosis S/p TAVR echocardiogram performed June 2023, mean gradient low 20 range No significant murmur on exam, asymptomatic Repeat echo 2025  Hyperlipidemia On Crestor 20 daily Cholesterol is at goal on the current lipid regimen. No changes to the medications were made.  Leg edema:  Prior history chronic leg swelling, reports it is worse on the left than the right Prior DVT on the left 2012 Denies any acute changes in swelling, no pain in left leg Recommend compression hose If symptoms of swelling on left get worse, could order repeat ultrasound  Total encounter time more than 30 minutes  Greater than 50% was spent in counseling and coordination of care with the patient   Signed, Dossie Arbour, M.D., Ph.D. [facility] [place], [place] [phone]

## 2022-09-08 ENCOUNTER — Encounter: Payer: Self-pay | Admitting: Cardiovascular Disease

## 2022-09-08 ENCOUNTER — Ambulatory Visit: Payer: Medicare Other | Attending: Cardiovascular Disease | Admitting: Cardiovascular Disease

## 2022-09-08 VITALS — BP 120/48 | HR 59 | Ht 66.0 in | Wt 207.4 lb

## 2022-09-08 DIAGNOSIS — I6523 Occlusion and stenosis of bilateral carotid arteries: Secondary | ICD-10-CM

## 2022-09-08 DIAGNOSIS — I479 Paroxysmal tachycardia, unspecified: Secondary | ICD-10-CM | POA: Diagnosis not present

## 2022-09-08 DIAGNOSIS — E782 Mixed hyperlipidemia: Secondary | ICD-10-CM

## 2022-09-08 DIAGNOSIS — I10 Essential (primary) hypertension: Secondary | ICD-10-CM

## 2022-09-08 DIAGNOSIS — I25118 Atherosclerotic heart disease of native coronary artery with other forms of angina pectoris: Secondary | ICD-10-CM | POA: Diagnosis not present

## 2022-09-08 DIAGNOSIS — I350 Nonrheumatic aortic (valve) stenosis: Secondary | ICD-10-CM | POA: Diagnosis not present

## 2022-09-08 MED ORDER — POTASSIUM CHLORIDE ER 10 MEQ PO TBCR: EXTENDED_RELEASE_TABLET | ORAL | 3 refills | Status: DC

## 2022-09-08 MED ORDER — FUROSEMIDE 40 MG PO TABS: ORAL_TABLET | ORAL | 3 refills | Status: DC

## 2022-09-08 NOTE — Patient Instructions (Signed)

## 2022-10-19 DIAGNOSIS — M79674 Pain in right toe(s): Secondary | ICD-10-CM | POA: Diagnosis not present

## 2022-10-19 DIAGNOSIS — M79675 Pain in left toe(s): Secondary | ICD-10-CM | POA: Diagnosis not present

## 2022-10-19 DIAGNOSIS — B351 Tinea unguium: Secondary | ICD-10-CM | POA: Diagnosis not present

## 2022-11-12 DIAGNOSIS — I10 Essential (primary) hypertension: Secondary | ICD-10-CM | POA: Diagnosis not present

## 2022-11-12 DIAGNOSIS — E1165 Type 2 diabetes mellitus with hyperglycemia: Secondary | ICD-10-CM | POA: Diagnosis not present

## 2022-11-12 DIAGNOSIS — N183 Chronic kidney disease, stage 3 unspecified: Secondary | ICD-10-CM | POA: Diagnosis not present

## 2022-11-12 DIAGNOSIS — E785 Hyperlipidemia, unspecified: Secondary | ICD-10-CM | POA: Diagnosis not present

## 2022-11-18 DIAGNOSIS — M549 Dorsalgia, unspecified: Secondary | ICD-10-CM | POA: Diagnosis not present

## 2022-11-18 DIAGNOSIS — I10 Essential (primary) hypertension: Secondary | ICD-10-CM | POA: Diagnosis not present

## 2022-11-18 DIAGNOSIS — E1165 Type 2 diabetes mellitus with hyperglycemia: Secondary | ICD-10-CM | POA: Diagnosis not present

## 2022-11-18 DIAGNOSIS — E119 Type 2 diabetes mellitus without complications: Secondary | ICD-10-CM | POA: Diagnosis not present

## 2022-11-18 DIAGNOSIS — G8929 Other chronic pain: Secondary | ICD-10-CM | POA: Diagnosis not present

## 2022-11-18 DIAGNOSIS — Z0000 Encounter for general adult medical examination without abnormal findings: Secondary | ICD-10-CM | POA: Diagnosis not present

## 2022-12-01 DIAGNOSIS — M5030 Other cervical disc degeneration, unspecified cervical region: Secondary | ICD-10-CM | POA: Diagnosis not present

## 2022-12-01 DIAGNOSIS — N183 Chronic kidney disease, stage 3 unspecified: Secondary | ICD-10-CM | POA: Diagnosis not present

## 2022-12-01 DIAGNOSIS — G4486 Cervicogenic headache: Secondary | ICD-10-CM | POA: Diagnosis not present

## 2022-12-09 DIAGNOSIS — E1122 Type 2 diabetes mellitus with diabetic chronic kidney disease: Secondary | ICD-10-CM | POA: Diagnosis not present

## 2022-12-09 DIAGNOSIS — N183 Chronic kidney disease, stage 3 unspecified: Secondary | ICD-10-CM | POA: Diagnosis not present

## 2022-12-09 DIAGNOSIS — G4486 Cervicogenic headache: Secondary | ICD-10-CM | POA: Diagnosis not present

## 2022-12-09 DIAGNOSIS — Z8719 Personal history of other diseases of the digestive system: Secondary | ICD-10-CM | POA: Diagnosis not present

## 2023-01-25 DIAGNOSIS — B351 Tinea unguium: Secondary | ICD-10-CM | POA: Diagnosis not present

## 2023-01-25 DIAGNOSIS — M79675 Pain in left toe(s): Secondary | ICD-10-CM | POA: Diagnosis not present

## 2023-01-25 DIAGNOSIS — M79674 Pain in right toe(s): Secondary | ICD-10-CM | POA: Diagnosis not present

## 2023-02-16 ENCOUNTER — Ambulatory Visit: Payer: Medicare Other | Attending: Cardiovascular Disease

## 2023-02-16 DIAGNOSIS — I6523 Occlusion and stenosis of bilateral carotid arteries: Secondary | ICD-10-CM

## 2023-03-04 ENCOUNTER — Emergency Department
Admission: EM | Admit: 2023-03-04 | Discharge: 2023-03-04 | Disposition: A | Discharge status: Home / Self Care | Payer: Medicare Other | Attending: Emergency Medicine | Admitting: Emergency Medicine

## 2023-03-04 ENCOUNTER — Emergency Department: Payer: Medicare Other

## 2023-03-04 ENCOUNTER — Other Ambulatory Visit: Payer: Self-pay

## 2023-03-04 DIAGNOSIS — R1011 Right upper quadrant pain: Secondary | ICD-10-CM | POA: Diagnosis present

## 2023-03-04 LAB — CBC WITH DIFFERENTIAL/PLATELET
Abs Immature Granulocytes: 0.03 10*3/uL (ref 0.00–0.07)
Basophils Absolute: 0 10*3/uL (ref 0.0–0.1)
Basophils Relative: 0 %
Eosinophils Absolute: 0 10*3/uL (ref 0.0–0.5)
Eosinophils Relative: 0 %
HCT: 39.1 % (ref 39.0–52.0)
Hemoglobin: 13.7 g/dL (ref 13.0–17.0)
Immature Granulocytes: 0 %
Lymphocytes Relative: 25 %
Lymphs Abs: 2 10*3/uL (ref 0.7–4.0)
MCH: 32.9 pg (ref 26.0–34.0)
MCHC: 35 g/dL (ref 30.0–36.0)
MCV: 94 fL (ref 80.0–100.0)
Monocytes Absolute: 1 10*3/uL (ref 0.1–1.0)
Monocytes Relative: 12 %
Neutro Abs: 5 10*3/uL (ref 1.7–7.7)
Neutrophils Relative %: 63 %
Platelets: 110 10*3/uL — ABNORMAL LOW (ref 150–400)
RBC: 4.16 MIL/uL — ABNORMAL LOW (ref 4.22–5.81)
RDW: 12.9 % (ref 11.5–15.5)
WBC: 8 10*3/uL (ref 4.0–10.5)
nRBC: 0 % (ref 0.0–0.2)

## 2023-03-04 LAB — COMPREHENSIVE METABOLIC PANEL
ALT: 26 U/L (ref 0–44)
AST: 33 U/L (ref 15–41)
Albumin: 3.8 g/dL (ref 3.5–5.0)
Alkaline Phosphatase: 49 U/L (ref 38–126)
Anion gap: 11 (ref 5–15)
BUN: 16 mg/dL (ref 8–23)
CO2: 23 mmol/L (ref 22–32)
Calcium: 9 mg/dL (ref 8.9–10.3)
Chloride: 105 mmol/L (ref 98–111)
Creatinine, Ser: 0.81 mg/dL (ref 0.61–1.24)
GFR, Estimated: >60 mL/min (ref >60)
Glucose, Bld: 90 mg/dL (ref 70–99)
Potassium: 4.5 mmol/L (ref 3.5–5.1)
Sodium: 139 mmol/L (ref 135–145)
Total Bilirubin: 1.5 mg/dL — ABNORMAL HIGH (ref 0.0–1.2)
Total Protein: 7 g/dL (ref 6.5–8.1)

## 2023-03-04 LAB — URINALYSIS, ROUTINE W REFLEX MICROSCOPIC
Bilirubin Urine: NEGATIVE
Glucose, UA: NEGATIVE mg/dL
Hgb urine dipstick: NEGATIVE
Ketones, ur: NEGATIVE mg/dL
Leukocytes,Ua: NEGATIVE
Nitrite: NEGATIVE
Protein, ur: NEGATIVE mg/dL
Specific Gravity, Urine: 1.012 (ref 1.005–1.030)
pH: 5 (ref 5.0–8.0)

## 2023-03-04 MED ORDER — IOHEXOL 350 MG/ML SOLN
100.0000 mL | Freq: Once | INTRAVENOUS | Status: AC | PRN
  Administered 2023-03-04: 100 mL via INTRAVENOUS

## 2023-03-04 NOTE — ED Triage Notes (Signed)
Pt to ED for right flank pain started yesterday. Denies urinary sx, n/v.

## 2023-03-04 NOTE — ED Provider Triage Note (Signed)
Emergency Medicine Provider Triage Evaluation Note  Jacob Montes , a 83 y.o. male  was evaluated in triage.  Pt complains of right flank and upper abd pain, no fever at home, history of kidney stones and biliary obstructions . No CP or SOB  Review of Systems  Positive:  Negative:   Physical Exam  BP (!) 137/47 (BP Location: Left Arm)   Pulse 76   Temp 99 F (37.2 C) (Oral)   Resp 18   Ht 5\' 6"  (1.676 m)   Wt 93.4 kg   SpO2 96%   BMI 33.25 kg/m  Gen:   Awake, no distress   Resp:  Normal effort  MSK:   Moves extremities without difficulty  Other:  Ruq tender  Medical Decision Making  Medically screening exam initiated at 1:12 PM.  Appropriate orders placed.  Jacob Montes was informed that the remainder of the evaluation will be completed by another provider, this initial triage assessment does not replace that evaluation, and the importance of remaining in the ED until their evaluation is complete.  Patient stable, having right flank and RUQ pain with tenderness. VSS, NAD, check [place] and stone study.    Christen Bame, [place] 03/04/23 1315

## 2023-03-04 NOTE — ED Triage Notes (Signed)
First Nurse Note: Patient to ED via [facility] from home for abd pain- started last PM. RUQ pain- worse with movement. Took tylenol around 8am and gave some relief. Hx of kidney stones  EMS VS WNL

## 2023-03-04 NOTE — Discharge Instructions (Addendum)
Please return to the ER right away if you start developing any high fevers severe pain, chest pain difficulty breathing, diarrhea bloody diarrhea vomiting or other concerns arise.  Please return to the emergency room right away if you are to develop a fever, severe nausea, your pain becomes severe or worsens, you are unable to keep food down, begin vomiting any dark or bloody fluid, you develop any dark or bloody stools, feel dehydrated, or other new concerns or symptoms arise.

## 2023-03-04 NOTE — ED Provider Notes (Signed)
[facility] Provider Note    Event Date/Time   First MD Initiated Contact with Patient 03/04/23 1518     (approximate)   History   Flank Pain   HPI  Jacob Montes is a 83 y.o. male history of coronary disease previous kidney stones cholangitis status postcholecystectomy    Started about 2 days ago started having some low-grade fevers vomited once and has had a fairly consistent pain just below the rib cage worsened by deep breathing over the right upper abdomen.  He still eating had slight nausea couple days ago but that is resolved.  He also relates about a week ago after eating he had an episode of vomiting.  No diarrhea.  No chest pain.  No shortness of breath.  He is quite comfortable when laying at rest does not wish for any pain medication but when he takes a deep breath it hurts in his right lower lung right upper abdomen.  No rashes.  No back pain.  Symptoms are worsened by trying to sit up and move about at the house.  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   Physical Exam   Triage Vital Signs: ED Triage Vitals  Encounter Vitals Group     BP 03/04/23 1256 (!) 137/47     Systolic BP Percentile --      Diastolic BP Percentile --      Pulse Rate 03/04/23 1256 76     Resp 03/04/23 1256 18     Temp 03/04/23 1256 99 F (37.2 C)     Temp Source 03/04/23 1256 Oral     SpO2 03/04/23 1256 96 %     Weight 03/04/23 1312 206 lb (93.4 kg)     Height 03/04/23 1312 5\' 6"  (1.676 m)     Head Circumference --      Peak Flow --      Pain Score 03/04/23 1311 10     Pain Loc --      Pain Education --      Exclude from Growth Chart --     Most recent vital signs: Vitals:   03/04/23 1645 03/04/23 1806  BP: (!) 143/40 (!) 135/47  Pulse: 73 75  Resp: 18 (!) 21  Temp:  98.9 F (37.2 C)  SpO2: 97% 95%     General: Awake, no distress.  Very pleasant accompanied by son and  his wife CV:  Good peripheral perfusion.  Normal tones Resp:  Normal effort.  Clear bilateral but reports pleuritic pain with deep inspiration over the very right lower chest wall anterior Abd:  No distention.  Soft nontender nondistended except he has reproducible point tenderness of the right upper abdomen with no noted pain or masses.  There is no pain in remaining quadrants including the left lower quadrant.  There is no rebound tenderness anywhere.  No distention Other:     ED Results / Procedures / Treatments   Labs (all labs ordered are listed, but only abnormal results are displayed) Labs Reviewed  COMPREHENSIVE METABOLIC PANEL - Abnormal; Notable for the following components:      Result Value   Total Bilirubin 1.5 (*)    All other components within normal limits  URINALYSIS, ROUTINE W REFLEX MICROSCOPIC - Abnormal; Notable for the following components:   Color, Urine YELLOW (*)    APPearance CLEAR (*)    All  other components within normal limits  CBC WITH DIFFERENTIAL/PLATELET - Abnormal; Notable for the following components:   RBC 4.16 (*)    Platelets 110 (*)    All other components within normal limits  CBC WITH DIFFERENTIAL/PLATELET   CBC and metabolic panel are unremarkable.  Normal white count.  Comprehensive metabolic panel normal with no abnormalities including LFTs with exception to very minimally elevated bilirubin (very minimal) - in keeping within 9 mo ago.   Normal urinalysis no blood  EKG  And inter by me at 1305 heart rate 70 QRS 120 QTc 430 Normal sinus rhythm, occasional slight irregularity.  No evidence of acute ischemia.  Minimal nonspecific conduction block   RADIOLOGY    US Abdomen Limited RUQ (LIVER/GB) Result Date: 03/04/2023 CLINICAL DATA:  One day history of right upper quadrant abdominal pain EXAM: ULTRASOUND ABDOMEN LIMITED RIGHT UPPER QUADRANT COMPARISON:  None Available. FINDINGS: Gallbladder: Surgically absent. Common bile duct:  Diameter: 3 mm Liver: No focal lesion identified. Within normal limits in parenchymal echogenicity. Portal vein is patent on color Doppler imaging with normal direction of blood flow towards the liver. Other: None. IMPRESSION: Status post cholecystectomy. Otherwise normal right upper quadrant ultrasound examination. Electronically Signed   By: Agustin Cree M.D.   On: 03/04/2023 14:37   CT ABDOMEN PELVIS WO CONTRAST Result Date: 03/04/2023 CLINICAL DATA:  Abdominal/flank pain, stone suspected EXAM: CT ABDOMEN AND PELVIS WITHOUT CONTRAST TECHNIQUE: Multidetector CT imaging of the abdomen and pelvis was performed following the standard protocol without IV contrast. RADIATION DOSE REDUCTION: This exam was performed according to the departmental dose-optimization program which includes automated exposure control, adjustment of the mA and/or kV according to patient size and/or use of iterative reconstruction technique. COMPARISON:  CT abdomen/pelvis dated May 20, 2022. FINDINGS: Lower chest: No acute abnormality. Partially visualized aortic valve prosthesis. Hepatobiliary: No suspicious focal hepatic lesion identified within the limits of an unenhanced exam. Status post cholecystectomy. Similar pneumobilia is noted likely related to prior instrumentation. Pancreas: Unremarkable. Spleen: Normal in size without focal abnormality. Adrenals/Urinary Tract: Adrenal glands are unremarkable. There are a few nonobstructive punctate stones bilaterally, measuring up to 2 mm. No ureteral calculi. No hydronephrosis. Bladder is unremarkable. Stomach/Bowel: Stomach is within normal limits. Appendix appears normal. No evidence of obstruction. Colonic diverticulosis with subtle pericolonic fat stranding involving a short segment of the sigmoid colon, suggestive of early diverticulitis. No evidence of loculated collection or perforation. Vascular/Lymphatic: The abdominal aorta is normal in caliber. Aortic atherosclerosis. No enlarged  abdominal or pelvic lymph nodes. Reproductive: Prostate is unremarkable. Other: No abdominopelvic ascites. No intraperitoneal free air. Small fat containing bilateral inguinal hernias. Musculoskeletal: No acute osseous abnormality. No suspicious osseous lesion. Multilevel degenerative changes of the spine. IMPRESSION: 1. Colonic diverticulosis with subtle pericolonic fat stranding involving a short segment of the sigmoid colon, suggestive of mild early diverticulitis. No evidence of perforation or abscess. 2. Nonobstructive punctate bilateral renal calculi. 3. Additional unchanged chronic findings, as described above. 4.  Aortic Atherosclerosis (ICD10-I70.0). Electronically Signed   By: Hart Robinsons M.D.   On: 03/04/2023 14:18    CTA IMPRESSION: 1. Motion degradation limits assessment for PE. Questionable filling defect within a lingular subsegmental vessel, difficult to exclude small PE but suspect largely related to motion artifact. No CT evidence for acute intrathoracic abnormality. 2. No CT evidence for acute intra-abdominal or pelvic abnormality. Diverticular disease of the colon without acute inflammatory process. 3. Aortic atherosclerosis.   PROCEDURES:  Critical Care performed: No  Procedures  MEDICATIONS ORDERED IN ED: Medications  iohexol (OMNIPAQUE) 350 MG/ML injection 100 mL (100 mLs Intravenous Contrast Given 03/04/23 1639)     IMPRESSION / MDM / ASSESSMENT AND PLAN / ED COURSE  I reviewed the triage vital signs and the nursing notes.                              Differential diagnosis includes, but is not limited to, possibly musculoskeletal pain pleuritic pain, no tachycardia or hypoxia but given he has a history of remote DVT and now has a reproducible fairly pleuritic pain in his right upper quadrant versus right lower chest I think after review of his noncontrasted imaging would be reasonable to obtain CT imaging with contrast to exclude PE and also provide  contrasted imaging of the upper abdomen.  Patient and his family agreeable with this plan.  He does not wish for any pain medication at this time  No acute cardiac symptoms ECG without acute ischemic abnormality.  Labs reassuring.  He saw him IV getting flulike symptoms a couple days ago and he and his family reports he had negative flu and COVID testing done at Dr. Elisabeth Cara office just 2 days ago, does not wish for repeat  Pain is reproducible located in the right upper abdomen seems superficial possibly musculoskeletal worsen when he sits up.  However given his history of cholangitis and biliary issues in the past I think would be reasonable to also include the upper abdomen on contrast-enhanced imaging.  Alert well-appearing nontoxic.  Patient's presentation is most consistent with acute complicated illness / injury requiring diagnostic workup.      Clinical Course as of 03/04/23 1942  Thu Mar 04, 2023  1933 A questionable lingular filling defect is noted, possibly artifactual.  He the patient does not have symptoms of left-sided pleuritic pain which I think would argue somewhat against a PE.  There is no right sided PE noted. [MQ]    Clinical Course User Index [MQ] Sharyn Creamer, MD   Imaging finding of possible mild sigmoid diverticulitis does not seem to correlate well with his symptoms or physical exam.  He has no left lower quadrant pain.  ----------------------------------------- 7:41 PM on 03/04/2023 ----------------------------------------- Patient reports that he started passing flatus and the pain has improved quite a bit.  He is now comfortable and reports that area of pain in the right upper abdomen is getting much better.  At this juncture and with his repeat imaging with contrast that shows no evidence of obvious diverticulitis, no obvious pulmonary embolism and his symptoms being largely resolved I am apt to believe that this may be related to gastrointestinal/perhaps  trapped gas type symptomatology.  He has no evidence to support acute diverticulitis at this time, no left lower quadrant pain, no ongoing pleuritic pain.  Doubt PE  Discussed with the patient and his family, they are comfortable with plan to discharge, take over-the-counter anti-gas medication such as Gas-X, and follow-up closely with Dr. Sullivan Lone.  Discussed and reviewed careful return precautions  Return precautions and treatment recommendations and follow-up discussed with the patient who is agreeable with the plan.    FINAL CLINICAL IMPRESSION(S) / ED DIAGNOSES   Final diagnoses:  Colicky RUQ abdominal pain     Rx / DC Orders   ED Discharge Orders     None        Note:  This document was prepared using Dragon voice recognition software  and may include unintentional dictation errors.   Sharyn Creamer, MD 03/04/23 [phone]

## 2023-03-05 LAB — CBC WITH DIFFERENTIAL/PLATELET

## 2023-03-23 ENCOUNTER — Ambulatory Visit: Payer: Medicare Other | Admitting: Dermatology

## 2023-03-30 ENCOUNTER — Other Ambulatory Visit: Payer: Self-pay | Admitting: Infectious Diseases

## 2023-03-30 ENCOUNTER — Ambulatory Visit
Admission: RE | Admit: 2023-03-30 | Discharge: 2023-03-30 | Disposition: A | Discharge status: Home / Self Care | Payer: Medicare Other | Source: Ambulatory Visit | Attending: Infectious Diseases | Admitting: Infectious Diseases

## 2023-03-30 DIAGNOSIS — R9389 Abnormal findings on diagnostic imaging of other specified body structures: Secondary | ICD-10-CM

## 2023-03-30 DIAGNOSIS — R509 Fever, unspecified: Secondary | ICD-10-CM | POA: Insufficient documentation

## 2023-03-30 DIAGNOSIS — R1084 Generalized abdominal pain: Secondary | ICD-10-CM | POA: Insufficient documentation

## 2023-03-30 DIAGNOSIS — R053 Chronic cough: Secondary | ICD-10-CM

## 2023-03-30 MED ORDER — IOHEXOL 300 MG/ML  SOLN
100.0000 mL | Freq: Once | INTRAMUSCULAR | Status: AC | PRN
  Administered 2023-03-30: 100 mL via INTRAVENOUS

## 2023-06-06 IMAGING — CR DG ABDOMEN 1V
1 series · 2 of 2 positions shown · non-contrast
Comparison: None Available.

CLINICAL DATA: Abdominal pain and distention

EXAM:
ABDOMEN - 1 VIEW

[Series 1: dg abd 1 view · 0.14mm/px · 2 of 2 slices shown]
[im 1/2]
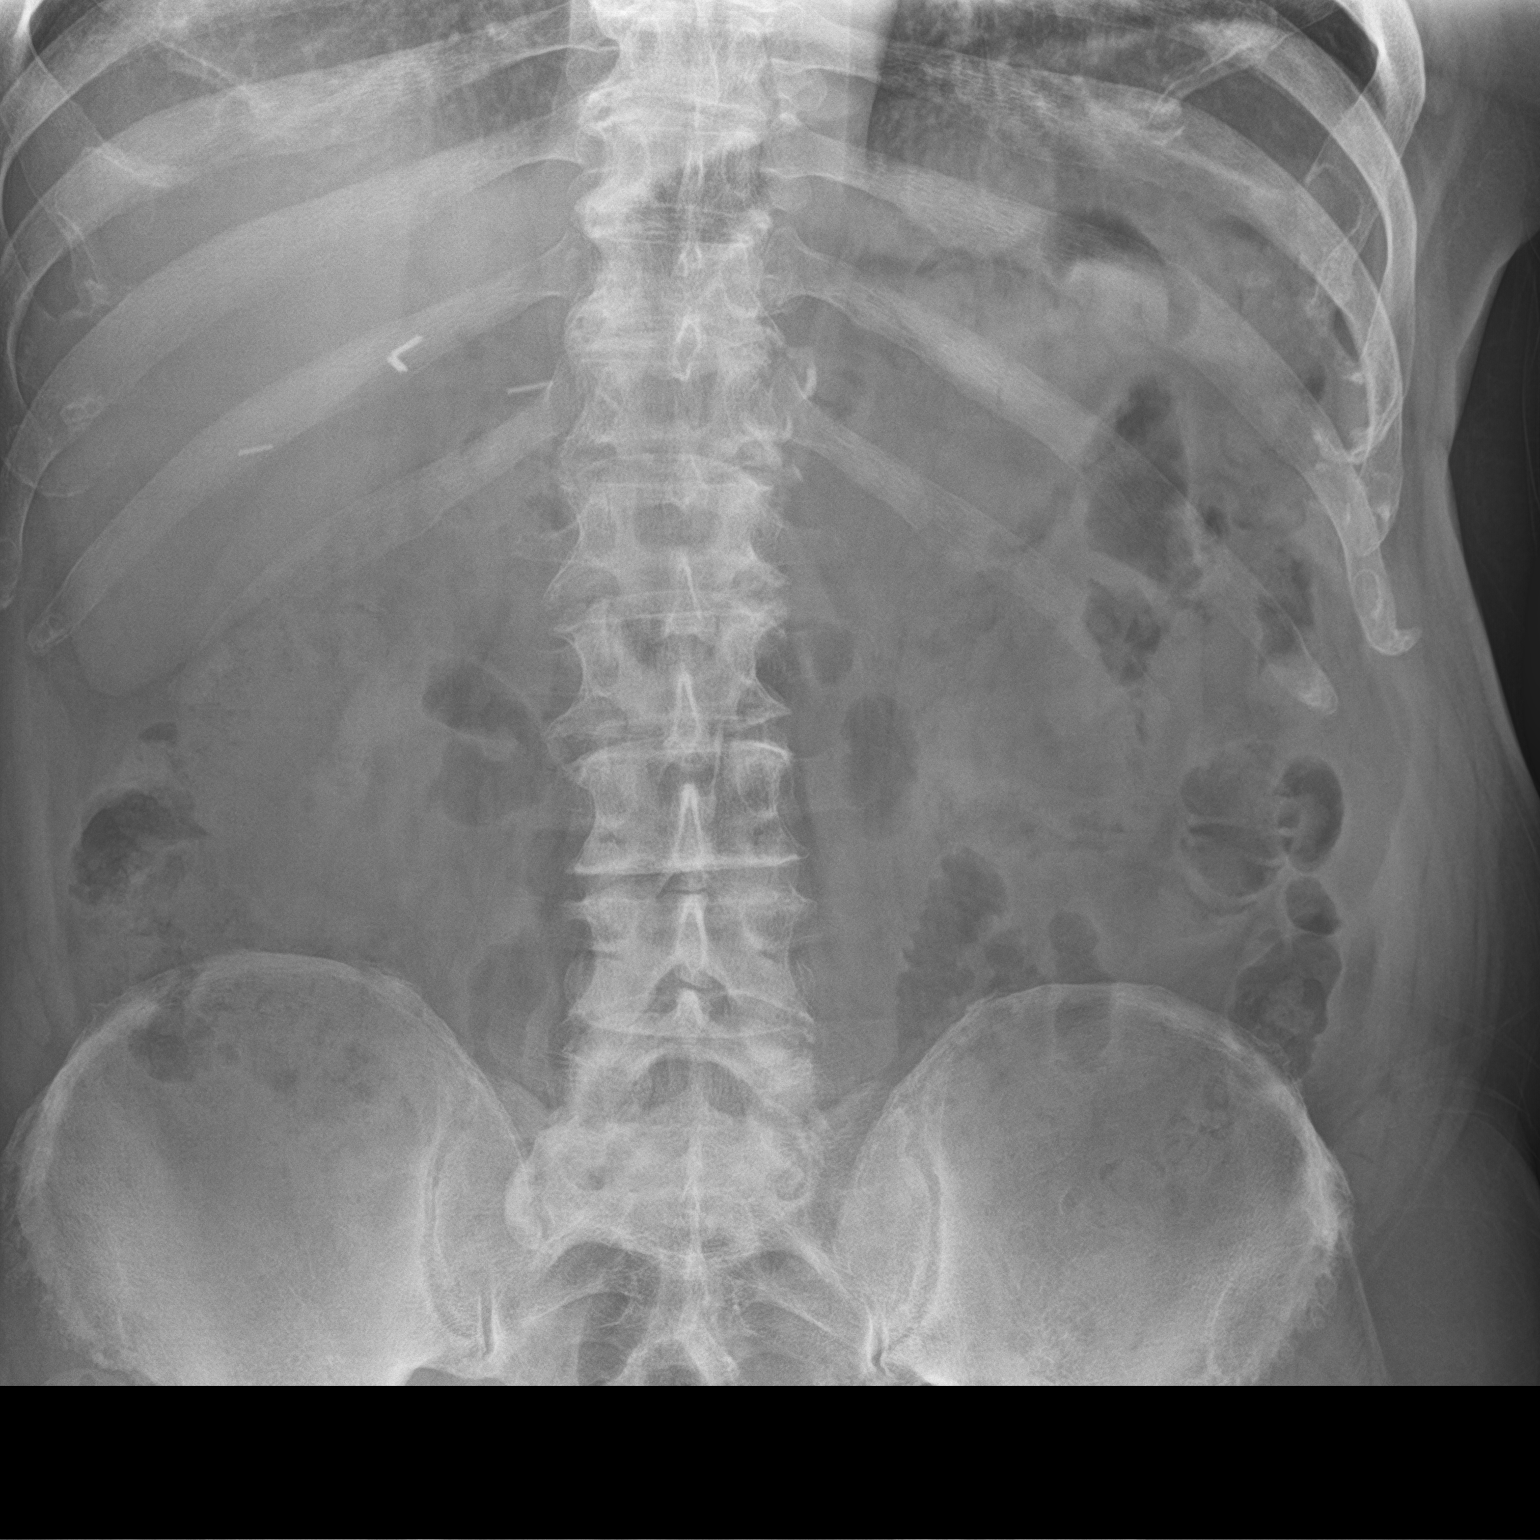
[im 2/2]
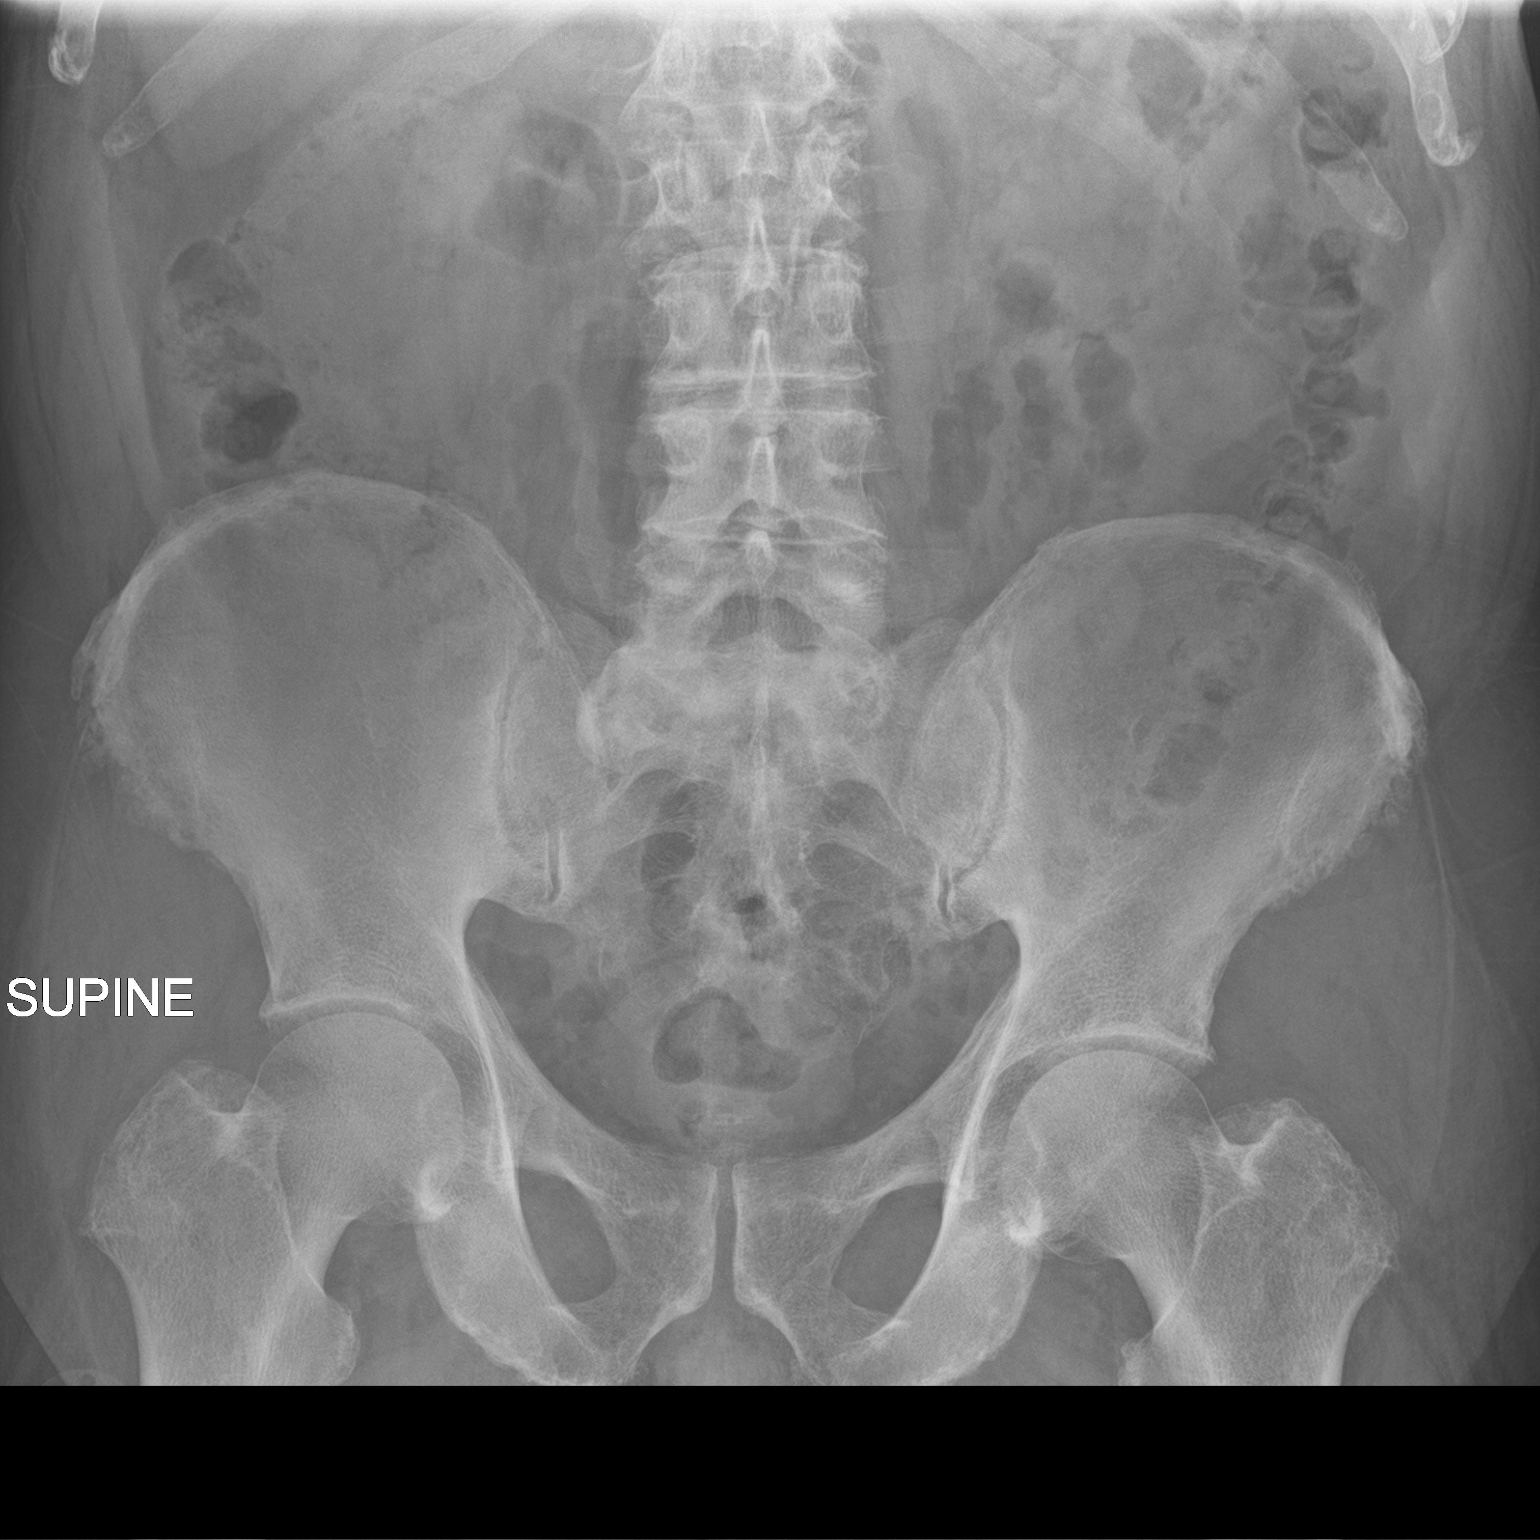

[2 of 2 positions shown; findings below may reference images not displayed]

FINDINGS: Bowel gas pattern is nonspecific. Small amount of stool is seen in
the colon. There is no fecal impaction in the rectosigmoid. Surgical
clips are seen in the right upper quadrant. No abnormal masses or
calcifications are seen. Degenerative changes are noted in the
lumbar spine.
IMPRESSION: Nonspecific bowel gas pattern. Small stool burden in the colon
without signs of fecal impaction in the rectosigmoid.

## 2023-06-11 IMAGING — MR MR ABDOMEN WO/W CM MRCP
20 of 21 series · 45 of 48 positions shown · IV contrast (gadavist)
Comparison: Ultrasound July 05, 2021

CLINICAL DATA: Fever since last [REDACTED] with upper abdominal pain
and elevated bilirubin with negative ultrasound.

EXAM:
MRI ABDOMEN WITHOUT AND WITH CONTRAST (INCLUDING MRCP)
TECHNIQUE: Multiplanar multisequence MR imaging of the abdomen was performed
both before and after the administration of intravenous contrast.
Heavily T2-weighted images of the biliary and pancreatic ducts were
obtained, and three-dimensional MRCP images were rendered by post
processing.
CONTRAST:  10mL GADAVIST GADOBUTROL 1 MMOL/ML IV SOLN

[Series 3: T2 · coronal · 6.5mm · 1.19mm/px · 1 of 33 slices shown (1 of 2)]
[im 1/33]
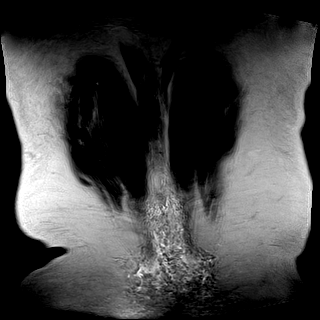

[Series 4: T2 · axial · 6.0mm · 1.19mm/px · 1 of 34 slices shown (2 of 2)]
[im 1/34]
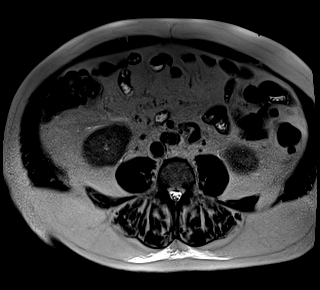

[Series 5: T1 · axial · 3.0mm · 1.19mm/px · z∈[-101,+112]mm · 2 of 72 slices shown (1 of 2)]
[im 1/72]
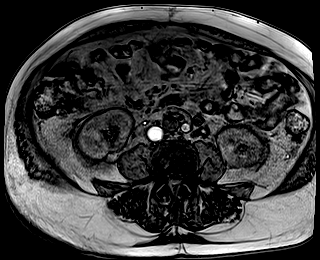
[im 72/72]
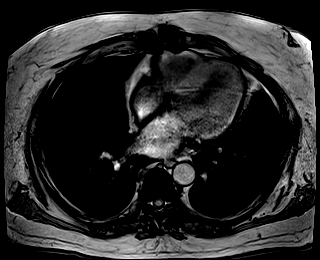

[Series 6: T1 · axial · 3.0mm · 1.19mm/px · z∈[-101,+112]mm · 2 of 72 slices shown (2 of 2)]
[im 1/72]
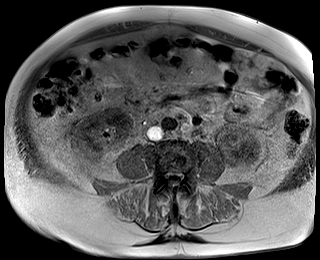
[im 72/72]
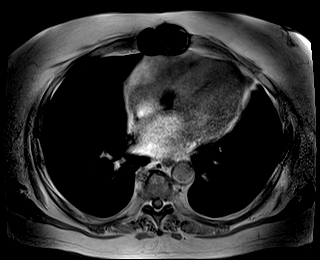

[Series 9: T2 fat-sat · axial · 6.0mm · 1.19mm/px · 1 of 36 slices shown]
[im 1/36]
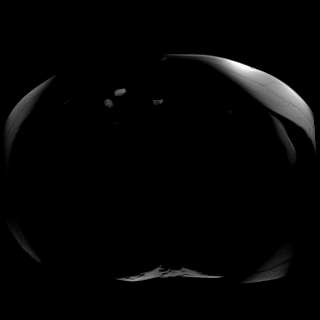

[Series 10: ax dwi_tracew · axial · 6.0mm · 1.49mm/px · z∈[-121,+131]mm · 4 of 108 slices shown]
[im 1/108]
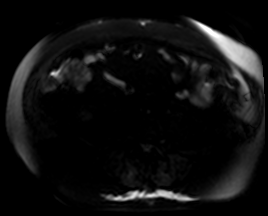
[im 36/108]
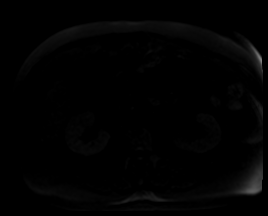
[im 72/108]
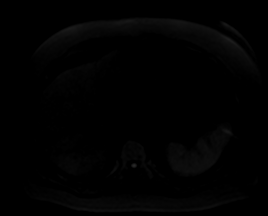
[im 108/108]
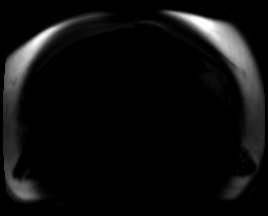

[Series 11: ax dwi_adc · axial · 6.0mm · 1.49mm/px · 1 of 36 slices shown]
[im 1/36]
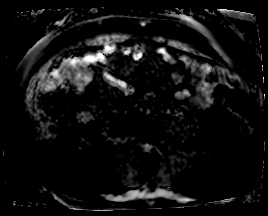

[Series 16: MRCP · coronal · 3.0mm · 1.12mm/px · 1 of 17 slices shown]
[im 1/17]
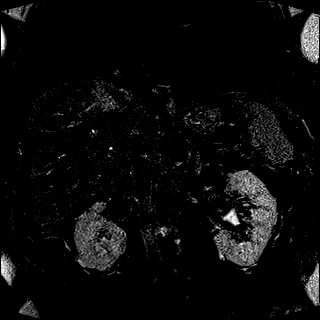

[Series 17: radials · oblique · 50.0mm · 0.78mm/px · 1 of 5 slices shown]
[im 1/5]
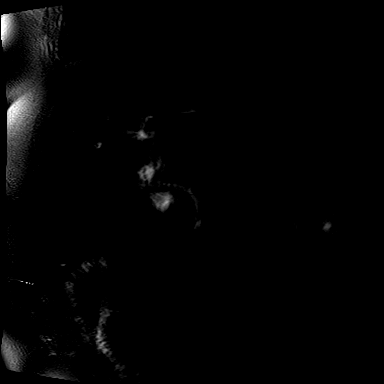

[Series 18: T1 dynamic fat-sat · axial · non-contrast · 3.0mm · 1.25mm/px · z∈[-96,+123]mm · 3 of 74 slices shown (1 of 5)]
[im 1/74]
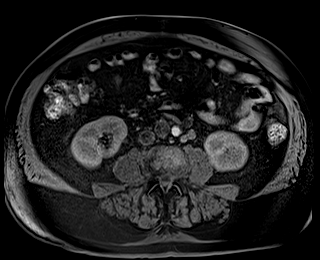
[im 37/74]
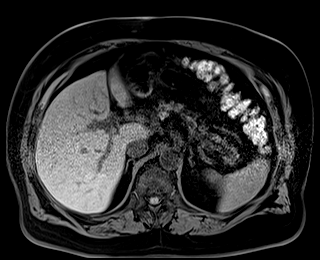
[im 74/74]
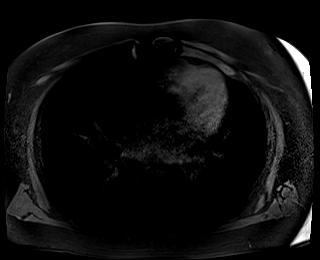

[Series 19: T1 dynamic fat-sat post-contrast · axial · 3.0mm · 1.25mm/px · z∈[-96,+123]mm · 3 of 74 slices shown (1 of 4)]
[im 1/74]
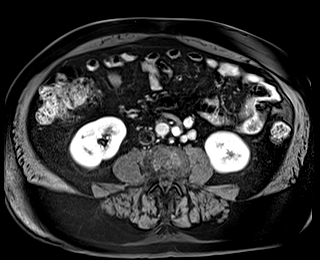
[im 37/74]
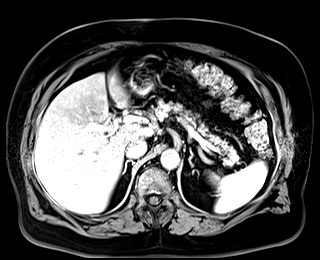
[im 74/74]
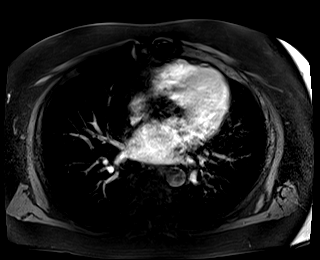

[Series 20: T1 dynamic fat-sat · axial · 3.0mm · 1.25mm/px · z∈[-96,+123]mm · 3 of 74 slices shown (2 of 5)]
[im 1/74]
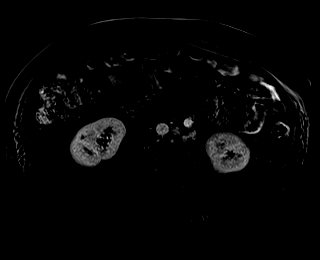
[im 37/74]
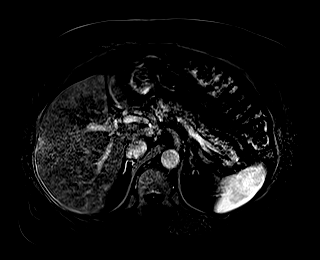
[im 74/74]
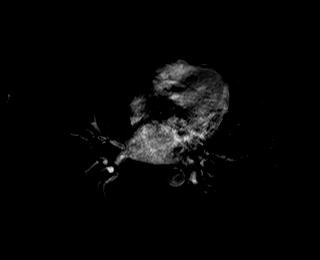

[Series 21: T1 dynamic fat-sat post-contrast · axial · 3.0mm · 1.25mm/px · z∈[-96,+123]mm · 3 of 74 slices shown (2 of 4)]
[im 1/74]
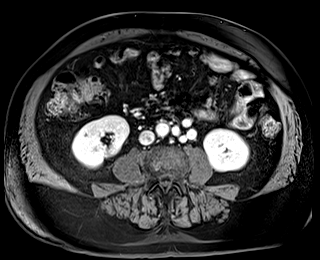
[im 37/74]
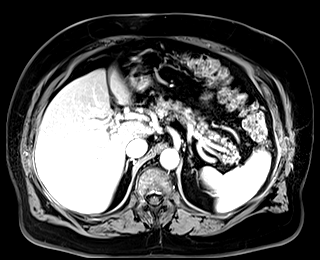
[im 74/74]
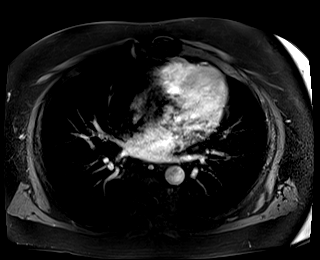

[Series 22: T1 dynamic fat-sat · axial · 3.0mm · 1.25mm/px · z∈[-96,+123]mm · 3 of 74 slices shown (3 of 5)]
[im 1/74]
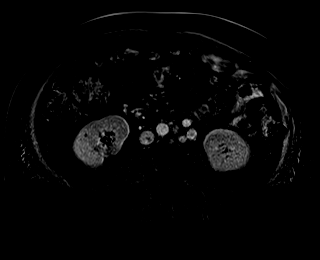
[im 37/74]
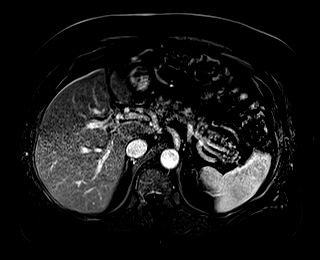
[im 74/74]
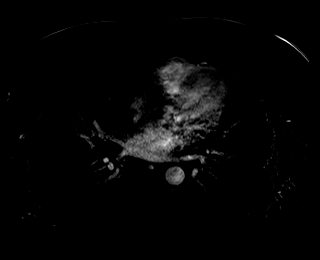

[Series 23: T1 dynamic fat-sat post-contrast · axial · 3.0mm · 1.25mm/px · z∈[-96,+123]mm · 3 of 74 slices shown (3 of 4)]
[im 1/74]
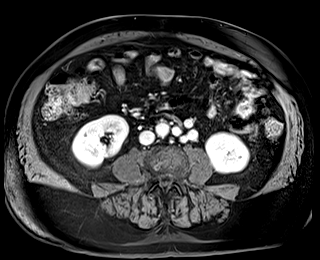
[im 37/74]
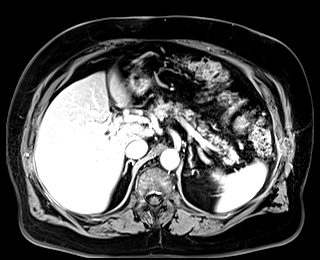
[im 74/74]
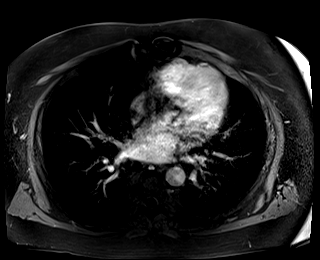

[Series 24: T1 dynamic fat-sat · axial · 3.0mm · 1.25mm/px · z∈[-96,+123]mm · 3 of 74 slices shown (4 of 5)]
[im 1/74]
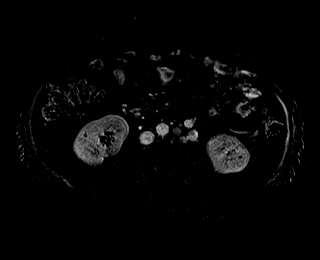
[im 37/74]
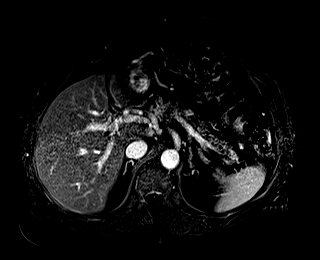
[im 74/74]
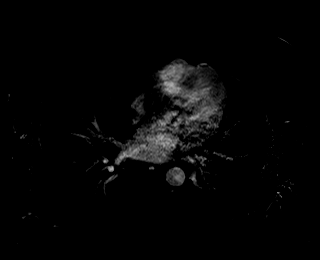

[Series 25: T1 dynamic post-contrast · coronal · 3.0mm · 1.31mm/px · 3 of 74 slices shown]
[im 1/74]
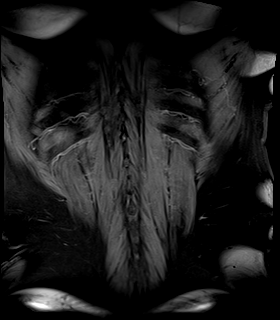
[im 37/74]
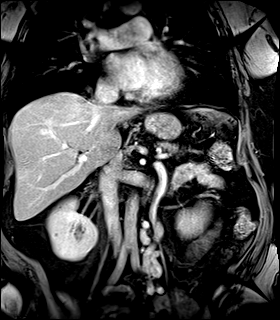
[im 74/74]
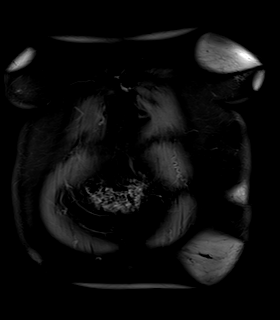

[Series 26: T1 dynamic fat-sat post-contrast · axial · 3.0mm · 1.25mm/px · z∈[-96,+123]mm · 3 of 74 slices shown (4 of 4)]
[im 1/74]
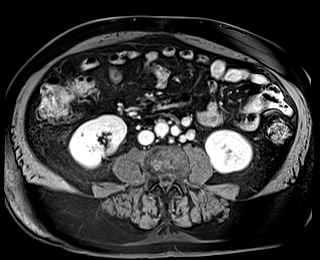
[im 37/74]
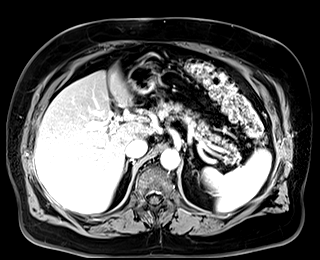
[im 74/74]
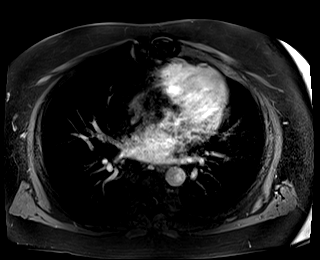

[Series 27: T1 dynamic fat-sat · axial · 3.0mm · 1.25mm/px · z∈[-96,+123]mm · 3 of 74 slices shown (5 of 5)]
[im 1/74]
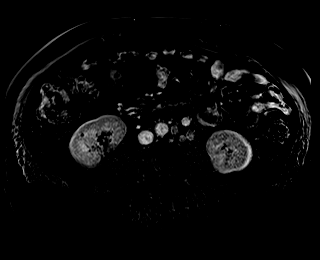
[im 37/74]
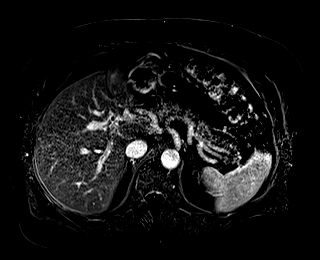
[im 74/74]
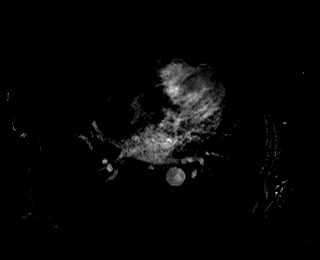

[Series 1023: mpr auxiliary images · axial · 1.0mm · 0.49mm/px · 1 of 1 slices shown]
[im 1/1]
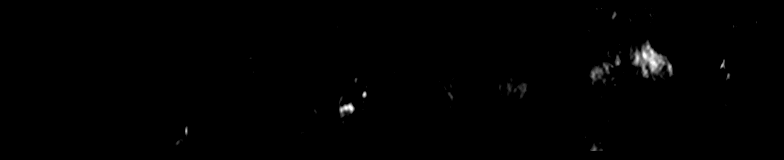

[45 of 48 positions shown; findings below may reference images not displayed]

FINDINGS: Lower chest: No effusion or consolidation at the lung bases.

Hepatobiliary: No substantial biliary duct dilation but with filling
defect in the distal common bile duct that may reflect a group of
small stones or coalescent sludgew. (Image 52/14) up to 8 mm
greatest craniocaudal extent and approximately 4 x 3 mm in the axial
plane but again without substantial biliary duct dilation following
cholecystectomy.

Vague areas of T1 hypointensity and T2 hypointensity near the
cephalad RIGHT and LEFT hemiliver with corresponding heterogeneity
of enhancement, slightly more pronounced arterial phase enhancement.
The portal vein is patent.

Areas of heterogeneity show a persistent enhancement across multiple
phases with perhaps mild fading pattern and without defined margins,
largest area measuring approximately 3.8 x 1.9 cm (image 20/23)

Vague areas also in the LEFT hepatic lobe are less numerous.

Pancreas: Mild pancreatic atrophy. No ductal dilation or sign of
inflammation.

Intrinsic T1 signal with mild preservation.

Spleen:  Normal size and contour without visible lesion.

Adrenals/Urinary Tract: No suspicious renal lesion, hydronephrosis
or perinephric stranding with normal appearance of the bilateral
adrenal glands.

Stomach/Bowel: No acute findings to the extent evaluated on this
abdominal MRI. There is a hyperenhancing lesion associated with the
greater curvature of the stomach near the fundus (image 24/25) 11
mm. Enhancement characteristics are similar to that of adjacent
spleen and the lesion is relatively homogeneous. Assessment of
previous imaging shows this is not changed since Monday November, 2020 and
in fact is stable dating back to Saturday November, 2018.

Vascular/Lymphatic: Collateral pathways in the LEFT retroperitoneum
are stable and not fully assessed. No adenopathy in the abdomen or
in the pelvis.

Other:  No ascites.

Musculoskeletal: Schmorl's nodes and vertebral hemangiomata not
changed since prior CT imaging.
IMPRESSION: 1. Vague areas of heterogeneous enhancement, ill-defined on all
submitted sequences raising the question of inflammatory
pseudolesions in the liver potentially related to underlying
cholangitis in the setting of suspected coalescent sludge and or
collection of small stones in the distal common bile duct. No focal
fluid though areas could represent early hepatic abscess. No current
frank evidence of obstruction. Given that these areas have a near
masslike appearance would suggest delayed imaging in 6-8 weeks
outside of the acute setting following therapy with MRI utilizing
Eovist contrast to exclude worsening or underlying neoplasm.
2. Small enhancing lesion near the greater curvature of the stomach
is favored to represent splenosis or small splenule those difficult
to separate from the stomach, therefore would include the
possibility of gastrointestinal stromal tumor. Heat damaged red
blood cell study may be helpful for further evaluation. This
represents a benign or indolent process based on behavior as it is
not changed since Saturday November, 2018.

## 2023-07-28 ENCOUNTER — Encounter: Payer: Self-pay | Admitting: Dermatology

## 2023-07-28 ENCOUNTER — Ambulatory Visit: Payer: Medicare Other | Admitting: Dermatology

## 2023-07-28 DIAGNOSIS — L820 Inflamed seborrheic keratosis: Secondary | ICD-10-CM

## 2023-07-28 DIAGNOSIS — W908XXA Exposure to other nonionizing radiation, initial encounter: Secondary | ICD-10-CM | POA: Diagnosis not present

## 2023-07-28 DIAGNOSIS — L570 Actinic keratosis: Secondary | ICD-10-CM | POA: Diagnosis not present

## 2023-07-28 DIAGNOSIS — Z1283 Encounter for screening for malignant neoplasm of skin: Secondary | ICD-10-CM

## 2023-07-28 DIAGNOSIS — Z872 Personal history of diseases of the skin and subcutaneous tissue: Secondary | ICD-10-CM

## 2023-07-28 DIAGNOSIS — Z85828 Personal history of other malignant neoplasm of skin: Secondary | ICD-10-CM

## 2023-07-28 DIAGNOSIS — L814 Other melanin hyperpigmentation: Secondary | ICD-10-CM | POA: Diagnosis not present

## 2023-07-28 DIAGNOSIS — D229 Melanocytic nevi, unspecified: Secondary | ICD-10-CM

## 2023-07-28 DIAGNOSIS — D1801 Hemangioma of skin and subcutaneous tissue: Secondary | ICD-10-CM

## 2023-07-28 DIAGNOSIS — L821 Other seborrheic keratosis: Secondary | ICD-10-CM

## 2023-07-28 DIAGNOSIS — L578 Other skin changes due to chronic exposure to nonionizing radiation: Secondary | ICD-10-CM

## 2023-07-28 NOTE — Patient Instructions (Addendum)

## 2023-07-28 NOTE — Progress Notes (Signed)
 Follow-Up Visit   Subjective  Jacob Montes is a 83 y.o. male who presents for the following: Skin Cancer Screening and Full Body Skin Exam. Hx of AKs. Hx of skin cancer. Hx of PDT and 5FU/Calcipotriene in the past.   The patient presents for Total-Body Skin Exam (TBSE) for skin cancer screening and mole check. The patient has spots, moles and lesions to be evaluated, some may be new or changing and the patient may have concern these could be cancer.  This patient is accompanied in the office by his wife.  The following portions of the chart were reviewed this encounter and updated as appropriate: medications, allergies, medical history  Review of Systems:  No other skin or systemic complaints except as noted in HPI or Assessment and Plan.  Objective  Well appearing patient in no apparent distress; mood and affect are within normal limits.  A full examination was performed including scalp, head, eyes, ears, nose, lips, neck, chest, axillae, abdomen, back, buttocks, bilateral upper extremities, bilateral lower extremities, hands, feet, fingers, toes, fingernails, and toenails. All findings within normal limits unless otherwise noted below.   Relevant physical exam findings are noted in the Assessment and Plan.  Scalp, face, ears x16 (16) Erythematous thin papules/macules with gritty scale.  Scalp x16 (16) Erythematous keratotic or waxy stuck-on papule or plaque. Right superior helix, Left lower eyelid margin Clear today. No sign of recurrence  Assessment & Plan   SKIN CANCER SCREENING PERFORMED TODAY.  HISTORY OF SKIN CANCER - Clear. Observe for recurrence.  - Call clinic for new or changing lesions.   - Recommend regular skin exams, daily broad-spectrum spf 30+ sunscreen use, and photoprotection.      LENTIGINES, SEBORRHEIC KERATOSES, HEMANGIOMAS - Benign normal skin lesions - Benign-appearing - Call for any changes  MELANOCYTIC NEVI - Tan-brown and/or pink-flesh-colored  symmetric macules and papules - Benign appearing on exam today - Observation - Call clinic for new or changing moles - Recommend daily use of broad spectrum spf 30+ sunscreen to sun-exposed areas.   AK (ACTINIC KERATOSIS) (16) Scalp, face, ears x16 (16) Actinic keratoses are precancerous spots that appear secondary to cumulative UV radiation exposure/sun exposure over time. They are chronic with expected duration over 1 year. A portion of actinic keratoses will progress to squamous cell carcinoma of the skin. It is not possible to reliably predict which spots will progress to skin cancer and so treatment is recommended to prevent development of skin cancer.  Recommend daily broad spectrum sunscreen SPF 30+ to sun-exposed areas, reapply every 2 hours as needed.  Recommend staying in the shade or wearing long sleeves, sun glasses (UVA+UVB protection) and wide brim hats (4-inch brim around the entire circumference of the hat). Call for new or changing lesions. Destruction of lesion - Scalp, face, ears x16 (16) Complexity: simple   Destruction method: cryotherapy   Informed consent: discussed and consent obtained   Timeout:  patient name, date of birth, surgical site, and procedure verified Lesion destroyed using liquid nitrogen: Yes   Region frozen until ice ball extended beyond lesion: Yes   Outcome: patient tolerated procedure well with no complications   Post-procedure details: wound care instructions given   Additional details:  Prior to procedure, discussed risks of blister formation, small wound, skin dyspigmentation, or rare scar following cryotherapy. Recommend Vaseline ointment to treated areas while healing.  INFLAMED SEBORRHEIC KERATOSIS (16) Scalp x16 (16) Symptomatic, irritating, patient would like treated. Destruction of lesion - Scalp x16 (16) Complexity:  simple   Destruction method: cryotherapy   Informed consent: discussed and consent obtained   Timeout:  patient name,  date of birth, surgical site, and procedure verified Lesion destroyed using liquid nitrogen: Yes   Region frozen until ice ball extended beyond lesion: Yes   Outcome: patient tolerated procedure well with no complications   Post-procedure details: wound care instructions given   Additional details:  Prior to procedure, discussed risks of blister formation, small wound, skin dyspigmentation, or rare scar following cryotherapy. Recommend Vaseline ointment to treated areas while healing.  HISTORY OF ACTINIC KERATOSIS Right superior helix, Left lower eyelid margin Watch for recurrence. Clear todsy.   Return in about 9 months (around 04/26/2024) for AK/scalp Follow Up.  I, Jill Parcell, CMA, am acting as scribe for Celine Collard, MD.   Documentation: I have reviewed the above documentation for accuracy and completeness, and I agree with the above.  Celine Collard, MD

## 2023-08-06 ENCOUNTER — Ambulatory Visit: Admitting: Cardiovascular Disease

## 2023-09-13 NOTE — Progress Notes (Unsigned)
 Cardiology Office Note  Date:  09/14/2023   ID:  Jacob Montes, DOB 01/06/1941, MRN [id number]  PCP:  Jacob Charlie CROME, MD   Chief Complaint  Patient presents with   12 month follow up     Doing well. Denies chest pain or shortness of breath.     HPI:  [name] is a 83--year-old gentleman with coronary artery disease, bypass surgery three-vessel June 2009,  aortic valve stenosis with TAVR July 2018,  PAD, carotid stenosis with left carotid endarterectomy 2009,  DVT/PE 2012,  prior smoker,  sleep apnea, not on CPAP Seen in the hospital October 2020  with abdominal pain, Rigors, chills fever  Common bile duct stone 8 mm with transaminitis , had ERCP Demand ischemia in the setting of above Who presents for f/u of her coronary disease, aortic valve prosthesis/TAVR  Last seen by myself in clinic July 2024 In follow-up today reports overall doing well Bothered by periodic leg cramps Continues to take Lasix  5 days a week Leg swelling worse on the left than the right (prior history of DVT on left) Sedentary, no regular exercise program  Imaging reviewed Carotid ultrasound January 2025 nonobstructive disease Patent CEA on the left  Last echocardiogram June 2023 TAVR valve mean gradient 23 mmHg  EKG personally reviewed by myself on todays visit EKG Interpretation Date/Time:  Tuesday September 14 2023 16:00:05 EDT Ventricular Rate:  64 PR Interval:  192 QRS Duration:  140 QT Interval:  446 QTC Calculation: 460 R Axis:   -15  Text Interpretation: Normal sinus rhythm Non-specific intra-ventricular conduction block Minimal voltage criteria for LVH, may be normal variant ( Cornell product ) T wave abnormality, consider lateral ischemia When compared with ECG of 04-Mar-2023 13:02, Premature atrial complexes are no longer Present QRS axis Shifted left T wave inversion no longer evident in Inferior leads T wave inversion now evident in Lateral leads Confirmed by Jacob Montes  [phone]) on 09/14/2023 4:56:47 PM   Other past medical hx Gall stone last month, 07/2021, with sepsis Treated with ABX, ID assisted with antibiotics 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  PSH:    Past Surgical History:  Procedure Laterality Date   CARDIAC CATHETERIZATION     Carotid surgery Left 03/08/2007   CHOLECYSTECTOMY  02/16/2018   COLONOSCOPY WITH PROPOFOL  N/A 09/01/2022   Procedure: COLONOSCOPY WITH PROPOFOL ;  Surgeon: Jacob Ole DASEN, MD;  Location: [facility] ENDOSCOPY;  Service: Endoscopy;  Laterality: N/A;   ENDOSCOPIC RETROGRADE CHOLANGIOPANCREATOGRAPHY (ERCP) WITH PROPOFOL  N/A 07/10/2021   Procedure: ENDOSCOPIC RETROGRADE CHOLANGIOPANCREATOGRAPHY (ERCP) WITH PROPOFOL ;  Surgeon: Jacob Carmine, MD;  Location: [facility] ENDOSCOPY;  Service: Endoscopy;  Laterality: N/A;   ERCP N/A 11/18/2018   Procedure: ENDOSCOPIC RETROGRADE CHOLANGIOPANCREATOGRAPHY (ERCP);  Surgeon: Jacob Carmine, MD;  Location: [facility] ENDOSCOPY;  Service: Endoscopy;  Laterality: N/A;   EUS N/A 08/21/2021   Procedure: UPPER  ENDOSCOPIC ULTRASOUND (EUS) LINEAR;  Surgeon: Jacob Asberry LABOR, MD;  Location: [facility] ENDOSCOPY;  Service:  Gastroenterology;  Laterality: N/A;  [facility]   Heart Bypass N/A 07/2007   left shoulder surgery Left 06/03/2009   POLYPECTOMY  09/01/2022   Procedure: POLYPECTOMY;  Surgeon: Jacob Ole DASEN, MD;  Location: [facility] ENDOSCOPY;  Service: Endoscopy;;   RIGHT AND LEFT HEART CATH  07/18/2007   right shoulder surgery  2003   TEE WITHOUT CARDIOVERSION N/A 11/21/2018   Procedure: TRANSESOPHAGEAL ECHOCARDIOGRAM (TEE);  Surgeon: Jacob Deatrice LABOR, MD;  Location: [facility] ORS;  Service: Cardiovascular;  Laterality: N/A;   TVAR      Current Outpatient Medications  Medication Sig Dispense Refill   amLODipine  (NORVASC ) 10 MG tablet Take 1 tablet by mouth once daily 90 tablet 3   amoxicillin -clavulanate (AUGMENTIN ) 875-125 MG tablet Take 1 tablet by mouth 2 (two) times daily. 20 tablet 0   aspirin  81 MG chewable tablet Chew 81 mg by mouth daily.     docusate sodium  (COLACE) 250 MG capsule Take 250 mg by mouth daily as needed for constipation.      furosemide  (LASIX ) 40 MG tablet Take 1 tablet (40 mg) by mouth daily 5 days per week 75 tablet 3   montelukast  (SINGULAIR ) 10 MG tablet Take 1 tablet (10 mg total) by mouth at bedtime as needed (allergies). 30 tablet 3   Multiple Vitamin (MULTIVITAMIN) tablet Take 1 tablet by mouth daily.     omeprazole (PRILOSEC) 20 MG capsule Take 20 mg by mouth daily. As needed     ondansetron  (ZOFRAN -ODT) 4 MG disintegrating tablet Take 1 tablet (4 mg total) by mouth every 8 (eight) hours as needed for nausea or vomiting. 20 tablet 0   potassium chloride  (KLOR-CON ) 10 MEQ tablet Take 1 tablet (10 mEq) 5 days per week. Take with lasix  75 tablet 3   rosuvastatin  (CRESTOR ) 20 MG tablet Take 1 tablet (20 mg total) by mouth daily. 90 tablet 0   tamsulosin  (FLOMAX ) 0.4 MG CAPS capsule Take 1 capsule by mouth once daily 90 capsule 0   No current facility-administered medications for this visit.    Allergies:   Atorvastatin and Ramipril   Social History:  The patient  reports that  he quit smoking about 46 years ago. His smoking use included cigarettes. He has never used smokeless tobacco. He reports that he does not drink alcohol and does not use drugs.   Family History:   family history includes Hypertension in his mother; Lung cancer in his father; Non-Hodgkin's lymphoma in his brother; Stroke in his mother.   Review of Systems: Review of Systems  Constitutional: Negative.   HENT: Negative.    Respiratory: Negative.    Cardiovascular: Negative.   Gastrointestinal: Negative.   Musculoskeletal: Negative.   Neurological: Negative.   Psychiatric/Behavioral: Negative.    All other systems reviewed and are negative.  PHYSICAL EXAM: VS:  BP (!) 130/52 (BP Location: Left Arm, Patient Position: Sitting, Cuff Size: Normal)   Pulse 64   Ht 5' 6 (1.676 m)   Wt 212 lb 8 oz (96.4 kg)   SpO2 96%   BMI 34.30 kg/m  , BMI Body mass index is 34.3 kg/m. Constitutional:  oriented to person, place, and time. No distress.  HENT:  Head: Grossly normal Eyes:  no discharge. No scleral icterus.  Neck: No JVD, no carotid bruits  Cardiovascular: Regular rate and rhythm, 2/6 systolic ejection murmur right sternal border Pulmonary/Chest: Clear to auscultation bilaterally, no  wheezes or rales Abdominal: Soft.  no distension.  no tenderness.  Musculoskeletal: Normal range of motion Neurological:  normal muscle tone. Coordination normal. No atrophy Skin: Skin warm and dry Psychiatric: normal affect, pleasant  Recent Labs: 03/04/2023: ALT 26; BUN 16; Creatinine, Ser 0.81; Hemoglobin 13.7; Platelets 110; Potassium 4.5; Sodium 139    Lipid Panel Lab Results  Component Value Date   CHOL 133 10/07/2021   HDL 43 10/07/2021   LDLCALC 67 10/07/2021   TRIG 133 10/07/2021    Wt Readings from Last 3 Encounters:  09/14/23 212 lb 8 oz (96.4 kg)  03/04/23 206 lb (93.4 kg)  09/08/22 207 lb 6 oz (94.1 kg)     ASSESSMENT AND PLAN:  Problem List Items Addressed This Visit        Cardiology Problems   Hyperlipidemia   Aortic stenosis   Relevant Orders   EKG 12-Lead (Completed)   Benign essential hypertension   Paroxysmal tachycardia (HCC)   Relevant Orders   EKG 12-Lead (Completed)   Carotid stenosis, bilateral   Other Visit Diagnoses       Coronary artery disease of native artery of native heart with stable angina pectoris (HCC)       Relevant Orders   EKG 12-Lead (Completed)      Coronary artery disease with stable angina Currently with no symptoms of angina. No further workup at this time. Continue current medication regimen.  Paroxysmal tachycardia Denies significant tachycardia Currently not on beta-blocker  Aortic valve disease, stenosis S/p TAVR echocardiogram performed June 2023, mean gradient low 20 range Murmur appreciated on exam Repeat echo ordered  Hyperlipidemia On Crestor  20 daily Cholesterol well-controlled though having some cramping in his legs - Recommend he try a trial hold of the Crestor , if symptoms improve would restart 10 mg daily  Leg edema:  Prior DVT on the left 2012 Leg swelling worse on the left than the right Given stable renal function, recommend Lasix  40 daily with potassium 10 daily    Signed, Velinda Lunger, M.D., Ph.D. [facility] [place], [place] [phone]

## 2023-09-14 ENCOUNTER — Ambulatory Visit: Attending: Cardiovascular Disease | Admitting: Cardiovascular Disease

## 2023-09-14 ENCOUNTER — Encounter: Payer: Self-pay | Admitting: Cardiovascular Disease

## 2023-09-14 DIAGNOSIS — I479 Paroxysmal tachycardia, unspecified: Secondary | ICD-10-CM | POA: Diagnosis not present

## 2023-09-14 DIAGNOSIS — I350 Nonrheumatic aortic (valve) stenosis: Secondary | ICD-10-CM

## 2023-09-14 DIAGNOSIS — I25118 Atherosclerotic heart disease of native coronary artery with other forms of angina pectoris: Secondary | ICD-10-CM

## 2023-09-14 DIAGNOSIS — I10 Essential (primary) hypertension: Secondary | ICD-10-CM | POA: Diagnosis not present

## 2023-09-14 DIAGNOSIS — E782 Mixed hyperlipidemia: Secondary | ICD-10-CM

## 2023-09-14 DIAGNOSIS — I6523 Occlusion and stenosis of bilateral carotid arteries: Secondary | ICD-10-CM

## 2023-09-14 MED ORDER — FUROSEMIDE 40 MG PO TABS: 40.0000 mg | ORAL_TABLET | Freq: Every day | ORAL | 3 refills | Status: DC

## 2023-09-14 MED ORDER — POTASSIUM CHLORIDE ER 10 MEQ PO TBCR: 10.0000 meq | EXTENDED_RELEASE_TABLET | Freq: Every day | ORAL | 3 refills | Status: DC

## 2023-09-14 NOTE — Patient Instructions (Addendum)
 Medication Instructions:   Please increase the lasix  up to 40 mg daily Potassium 10 meq daily  If you need a refill on your cardiac medications before your next appointment, please call your pharmacy.   Lab work: No new labs needed  Testing/Procedures:  Your physician has requested that you have an echocardiogram. Echocardiography is a painless test that uses sound waves to create images of your heart. It provides your doctor with information about the size and shape of your heart and how well your heart's chambers and valves are working.   You may receive an ultrasound enhancing agent through an IV if needed to better visualize your heart during the echo. This procedure takes approximately one hour.  There are no restrictions for this procedure.  This will take place at [address] [facility] [address], [place] [address]  Please note: We ask at that you not bring children with you during ultrasound (echo/ vascular) testing. Due to room size and safety concerns, children are not allowed in the ultrasound rooms during exams. Our front office staff cannot provide observation of children in our lobby area while testing is being conducted. An adult accompanying a patient to their appointment will only be allowed in the ultrasound room at the discretion of the ultrasound technician under special circumstances. We apologize for any inconvenience.   Follow-Up: At [facility], you and your health needs are our priority.  As part of our continuing mission to provide you with exceptional heart care, we have created designated Provider Care Teams.  These Care Teams include your primary Cardiologist (physician) and Advanced Practice Providers (APPs -  Physician Assistants and Nurse Practitioners) who all work together to provide you with the care you need, when you need it.  You will need a follow up appointment in 6 months  Providers on your designated Care Team:   Lonni Meager, NP Bernardino Bring, PA-C Cadence [place], [place]  COVID-19 Vaccine Information can be found at: [email or web address] For questions related to vaccine distribution or appointments, please email [email or web address] or call [phone].

## 2023-10-07 ENCOUNTER — Other Ambulatory Visit

## 2023-11-11 ENCOUNTER — Ambulatory Visit: Attending: Cardiovascular Disease

## 2023-11-11 DIAGNOSIS — I350 Nonrheumatic aortic (valve) stenosis: Secondary | ICD-10-CM | POA: Diagnosis not present

## 2023-11-11 LAB — ECHOCARDIOGRAM COMPLETE
AR max vel: 0.86 cm2
AV Area VTI: 0.84 cm2
AV Area mean vel: 0.84 cm2
AV Mean grad: 32 mmHg
AV Peak grad: 53 mmHg
Ao pk vel: 3.64 m/s
Area-P 1/2: 3.89 cm2
MV VTI: 2.3 cm2
S' Lateral: 5.2 cm

## 2023-11-15 ENCOUNTER — Ambulatory Visit: Payer: Self-pay | Admitting: Cardiovascular Disease

## 2023-12-31 ENCOUNTER — Telehealth: Payer: Self-pay | Admitting: Cardiovascular Disease

## 2023-12-31 NOTE — Telephone Encounter (Signed)
 Pt c/o Shortness Of Breath: STAT if SOB developed within the last 24 hours or pt is noticeably SOB on the phone  1. Are you currently SOB (can you hear that pt is SOB on the phone)?   No  2. How long have you been experiencing SOB? Last couple of weeks  3. Are you SOB when sitting or when up moving around?   When up and moving around  4. Are you currently experiencing any other symptoms?   No  Wife Lysbeth) stated they are concerned patient has been having SOB when up and moving around but not when sitting still.

## 2023-12-31 NOTE — Telephone Encounter (Signed)
 Called and spoke with patient. Patient with complaint of intermittent shortness of breath with exertion times two weeks. Denies any increase in leg swelling. Patient states that he has been taking his lasix  daily. Current vital signs 119/54, heart rate 54 and oxygen saturation 96. Patient offered appointment for 01/10/24 but is not able to make that appointment. Patient scheduled to be seen in clinic on 01/13/24. Patient made aware of ED precaution should any new symptoms develop or worsen.  Patient verbalizes understanding.

## 2024-01-03 ENCOUNTER — Ambulatory Visit: Attending: Cardiovascular Disease | Admitting: Cardiovascular Disease

## 2024-01-03 ENCOUNTER — Encounter: Payer: Self-pay | Admitting: Cardiovascular Disease

## 2024-01-03 VITALS — BP 140/60 | HR 70 | Ht 66.0 in | Wt 208.2 lb

## 2024-01-03 DIAGNOSIS — I25118 Atherosclerotic heart disease of native coronary artery with other forms of angina pectoris: Secondary | ICD-10-CM | POA: Diagnosis not present

## 2024-01-03 DIAGNOSIS — I350 Nonrheumatic aortic (valve) stenosis: Secondary | ICD-10-CM

## 2024-01-03 DIAGNOSIS — I10 Essential (primary) hypertension: Secondary | ICD-10-CM

## 2024-01-03 DIAGNOSIS — I479 Paroxysmal tachycardia, unspecified: Secondary | ICD-10-CM

## 2024-01-03 DIAGNOSIS — R0602 Shortness of breath: Secondary | ICD-10-CM

## 2024-01-03 MED ORDER — METOPROLOL SUCCINATE ER 25 MG PO TB24: 25.0000 mg | ORAL_TABLET | Freq: Every day | ORAL | 3 refills | Status: DC

## 2024-01-03 MED ORDER — FUROSEMIDE 20 MG PO TABS: ORAL_TABLET | ORAL | 1 refills | Status: DC

## 2024-01-03 NOTE — Patient Instructions (Addendum)
 Medication Instructions:   Please increase the lasix  up to 20 mg twice a day alternating with 20 mg daily  Please start metoprolol  succinate 25 mg daily at dinner  If you need a refill on your cardiac medications before your next appointment, please call your pharmacy.   Lab work: No new labs needed  Testing/Procedures:  Your provider has ordered a Lexiscan / Exercise Myoview  Stress test. This will take place at [facility]. Please report to the [facility] medical mall entrance. The volunteers at the first desk will direct you where to go.  [facility] MYOVIEW   Your provider has ordered a Stress Test with nuclear imaging. The purpose of this test is to evaluate the blood supply to your heart muscle. This procedure is referred to as a Non-Invasive Stress Test. This is because other than having an IV started in your vein, nothing is inserted or invades your body. Cardiac stress tests are done to find areas of poor blood flow to the heart by determining the extent of coronary artery disease (CAD). Some patients exercise on a treadmill, which naturally increases the blood flow to your heart, while others who are unable to walk on a treadmill due to physical limitations will have a pharmacologic/chemical stress agent called Lexiscan  . This medicine will mimic walking on a treadmill by temporarily increasing your coronary blood flow.   Please note: these test may take anywhere between 2-4 hours to complete  How to prepare for your Myoview  test:  Nothing to eat for 6 hours prior to the test No caffeine for 24 hours prior to test No smoking 24 hours prior to test. Your medication may be taken with water.  If your doctor stopped a medication because of this test, do not take that medication. Ladies, please do not wear dresses.  Skirts or pants are appropriate. Please wear a short sleeve shirt. No perfume, cologne or lotion. Wear comfortable walking shoes. No heels!   PLEASE NOTIFY THE OFFICE AT LEAST 24 HOURS  IN ADVANCE IF YOU ARE UNABLE TO KEEP YOUR APPOINTMENT.  [phone] AND  PLEASE NOTIFY NUCLEAR MEDICINE AT [facility] AT LEAST 24 HOURS IN ADVANCE IF YOU ARE UNABLE TO KEEP YOUR APPOINTMENT. [phone]   Follow-Up: At [facility], you and your health needs are our priority.  As part of our continuing mission to provide you with exceptional heart care, we have created designated Provider Care Teams.  These Care Teams include your primary Cardiologist (physician) and Advanced Practice Providers (APPs -  Physician Assistants and Nurse Practitioners) who all work together to provide you with the care you need, when you need it.  You will need a follow up appointment in 2 month  Providers on your designated Care Team:   Lonni Meager, NP Bernardino Bring, PA-C Cadence [place], [place]  COVID-19 Vaccine Information can be found at: [email or web address] For questions related to vaccine distribution or appointments, please email [email or web address] or call [phone])[phone].

## 2024-01-03 NOTE — Progress Notes (Signed)
 Cardiology Office Note  Date:  01/03/2024   ID:  Jacob Montes, DOB May 10, 1940, MRN [id number]  PCP:  Jacob Charlie CROME, MD   Chief Complaint  Patient presents with   Shortness of Breath    Patient c/o feeling bloated, shortness of breath and bilateral LE edema.     HPI:  [name] is a 83--year-old gentleman with coronary artery disease, bypass surgery three-vessel June 2009,  aortic valve stenosis with TAVR Jan 19, 2017,  PAD, carotid stenosis with left carotid endarterectomy 2009,  DVT/PE 2012,  prior smoker,  sleep apnea, not on CPAP Seen in the hospital October 2020  with abdominal pain, Rigors, chills fever  Common bile duct stone 8 mm with transaminitis , had ERCP Demand ischemia in the setting of above Who presents for f/u of his coronary disease, aortic valve prosthesis/TAVR  LOV 8/25 In follow-up today reports having worsening shortness of breath for the past few weeks, etiology unclear Reports taking his Lasix  20 mg daily Weight down 4 pounds from August 2025  Heart racing overnight, has to sit up Pulse recorded in the 90s, blood pressure stable on these episodes   echocardiogram October 2025 Low normal ejection fraction 50%, TAVR valve with mean gradient from 23 up to 32 over 2 years  Sedentary, no regular exercise program  Imaging reviewed Carotid ultrasound January 2025 nonobstructive disease Patent CEA on the left  EKG personally reviewed by myself on todays visit EKG Interpretation Date/Time:  Monday January 03 2024 16:16:03 EST Ventricular Rate:  70 PR Interval:  196 QRS Duration:  142 QT Interval:  432 QTC Calculation: 466 R Axis:   -23  Text Interpretation: Sinus rhythm with occasional Premature ventricular complexes Left bundle branch block When compared with ECG of 14-Sep-2023 16:00, Premature ventricular complexes are now Present Confirmed by Jacob Montes [phone]) on 01/03/2024 4:33:01 PM   Other past medical hx Gall stone last  month, 07/2021, with sepsis Treated with ABX, ID assisted with antibiotics 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  PSH:    Past Surgical History:  Procedure Laterality Date   CARDIAC CATHETERIZATION     Carotid surgery Left 03/08/2007   CHOLECYSTECTOMY  02/16/2018   COLONOSCOPY WITH PROPOFOL  N/A 09/01/2022   Procedure: COLONOSCOPY WITH PROPOFOL ;  Surgeon: Jacob Ole DASEN, MD;  Location: [facility] ENDOSCOPY;  Service: Endoscopy;  Laterality: N/A;   ENDOSCOPIC RETROGRADE CHOLANGIOPANCREATOGRAPHY (ERCP) WITH PROPOFOL  N/A 07/10/2021   Procedure: ENDOSCOPIC RETROGRADE CHOLANGIOPANCREATOGRAPHY (ERCP) WITH PROPOFOL ;  Surgeon: Jacob Carmine, MD;  Location: [facility] ENDOSCOPY;  Service: Endoscopy;  Laterality: N/A;   ERCP N/A 11/18/2018   Procedure: ENDOSCOPIC RETROGRADE CHOLANGIOPANCREATOGRAPHY (ERCP);  Surgeon: Jacob Carmine, MD;  Location: [facility] ENDOSCOPY;  Service: Endoscopy;  Laterality: N/A;   EUS N/A 08/21/2021   Procedure: UPPER ENDOSCOPIC ULTRASOUND (EUS) LINEAR;  Surgeon: Burbridge, Asberry LABOR, MD;  Location: [facility] ENDOSCOPY;  Service: Gastroenterology;  Laterality: N/A;  [facility]   Heart Bypass N/A 07/2007   left shoulder  surgery Left 06/03/2009   POLYPECTOMY  09/01/2022   Procedure: POLYPECTOMY;  Surgeon: Jacob Ole DASEN, MD;  Location: [facility] ENDOSCOPY;  Service: Endoscopy;;   RIGHT AND LEFT HEART CATH  07/18/2007   right shoulder surgery  2003   TEE WITHOUT CARDIOVERSION N/A 11/21/2018   Procedure: TRANSESOPHAGEAL ECHOCARDIOGRAM (TEE);  Surgeon: Jacob Deatrice LABOR, MD;  Location: [facility] ORS;  Service: Cardiovascular;  Laterality: N/A;   TVAR      Current Outpatient Medications  Medication Sig Dispense Refill   amLODipine  (NORVASC ) 10 MG tablet Take 1 tablet by mouth once daily 90 tablet 3   aspirin  81 MG chewable tablet Chew 81 mg by mouth daily.     cetirizine (ZYRTEC) 10 MG tablet Take 10 mg by mouth daily.     docusate sodium  (COLACE) 250 MG capsule Take 250 mg by mouth daily as needed for constipation.      Lactobacillus (PROBIOTIC ACIDOPHILUS PO) Take by mouth daily.     metoprolol  succinate (TOPROL -XL) 25 MG 24 hr tablet Take 1 tablet (25 mg total) by mouth daily. Take with or immediately following a meal. 90 tablet 3   montelukast  (SINGULAIR ) 10 MG tablet Take 1 tablet (10 mg total) by mouth at bedtime as needed (allergies). 30 tablet 3   Multiple Vitamin (MULTIVITAMIN) tablet Take 1 tablet by mouth daily.     omeprazole (PRILOSEC) 20 MG capsule Take 20 mg by mouth daily. As needed     ondansetron  (ZOFRAN -ODT) 4 MG disintegrating tablet Take 1 tablet (4 mg total) by mouth every 8 (eight) hours as needed for nausea or vomiting. 20 tablet 0   potassium chloride  (KLOR-CON ) 10 MEQ tablet Take 1 tablet (10 mEq total) by mouth daily. Take with lasix  90 tablet 3   rosuvastatin  (CRESTOR ) 20 MG tablet Take 1 tablet (20 mg total) by mouth daily. 90 tablet 0   furosemide  (LASIX ) 20 MG tablet Take 20 MG ( 1 tablet) twice daily alternating with 20 MG (1 tablet) once daily. 135 tablet 1   tamsulosin  (FLOMAX ) 0.4 MG CAPS capsule Take 1 capsule by mouth once daily (Patient not taking: Reported on 01/03/2024) 90  capsule 0   No current facility-administered medications for this visit.    Allergies:   Atorvastatin and Ramipril   Social History:  The patient  reports that he quit smoking about 46 years ago. His smoking use included cigarettes. He has never used smokeless tobacco. He reports that he does not drink alcohol and does not use drugs.   Family History:   family history includes Hypertension in his mother; Lung cancer in his father; Non-Hodgkin's lymphoma in his brother; Stroke in his mother.   Review of Systems: Review of Systems  Constitutional: Negative.   HENT: Negative.    Respiratory: Negative.    Cardiovascular: Negative.   Gastrointestinal: Negative.   Musculoskeletal: Negative.   Neurological: Negative.   Psychiatric/Behavioral: Negative.    All other systems reviewed and are negative.  PHYSICAL EXAM: VS:  BP (!) 140/60 (BP Location: Left Arm, Patient Position: Sitting, Cuff Size: Normal)   Pulse 70   Ht 5' 6 (1.676 m)   Wt 208 lb 4 oz (94.5 kg)   SpO2 93%   BMI 33.61 kg/m  , BMI Body mass index is 33.61 kg/m. Constitutional:  oriented to person, place, and time. No distress.  HENT:  Head: Normocephalic and atraumatic.  Eyes:  no discharge. No scleral icterus.  Neck: Normal range of motion. Neck supple. No JVD present.  Cardiovascular: Normal rate, regular rhythm, normal heart sounds and intact distal pulses. Exam reveals no gallop and no friction rub. No edema 2-3/6 systolic ejection murmur appreciated right sternal border radiating to the carotids Pulmonary/Chest: Effort normal and breath sounds normal. No stridor. No respiratory distress.  no wheezes.  no rales.  no tenderness.  Abdominal: Soft.  no distension.  no tenderness.  Musculoskeletal: Normal range of motion.  no  tenderness or deformity.  Neurological:  normal muscle tone. Coordination normal. No atrophy Skin: Skin is warm and dry. No rash noted. not diaphoretic.  Psychiatric:  normal mood and affect.  behavior is normal. Thought content normal.   Recent Labs: 03/04/2023: ALT 26; BUN 16; Creatinine, Ser 0.81; Hemoglobin 13.7; Platelets 110; Potassium 4.5; Sodium 139    Lipid Panel Lab Results  Component Value Date   CHOL 133 10/07/2021   HDL 43 10/07/2021   LDLCALC 67 10/07/2021   TRIG 133 10/07/2021    Wt Readings from Last 3 Encounters:  01/03/24 208 lb 4 oz (94.5 kg)  09/14/23 212 lb 8 oz (96.4 kg)  03/04/23 206 lb (93.4 kg)     ASSESSMENT AND PLAN:  Problem List Items Addressed This Visit       Cardiology Problems   Aortic stenosis   Relevant Medications   furosemide  (LASIX ) 20 MG tablet   metoprolol  succinate (TOPROL -XL) 25 MG 24 hr tablet   Other Relevant Orders   EKG 12-Lead (Completed)   Benign essential hypertension   Relevant Medications   furosemide  (LASIX ) 20 MG tablet   metoprolol  succinate (TOPROL -XL) 25 MG 24 hr tablet   Other Relevant Orders   EKG 12-Lead (Completed)   Paroxysmal tachycardia (HCC) - Primary   Relevant Medications   furosemide  (LASIX ) 20 MG tablet   metoprolol  succinate (TOPROL -XL) 25 MG 24 hr tablet   Other Relevant Orders   EKG 12-Lead (Completed)   Other Visit Diagnoses       Coronary artery disease of native artery of native heart with stable angina pectoris       Relevant Medications   furosemide  (LASIX ) 20 MG tablet   metoprolol  succinate (TOPROL -XL) 25 MG 24 hr tablet   Other Relevant Orders   EKG 12-Lead (Completed)   NM Myocar Multi W/Spect W/Wall Motion / EF     Shortness of breath       Relevant Orders   NM Myocar Multi W/Spect W/Wall Motion / EF      Shortness of breath Etiology unclear, unable to exclude ischemia versus worsening prosthetic aortic valve with mean gradient 32 mmHg - Appears grossly euvolemic - Recommend Lexi scan Myoview  to rule out ischemia - Suggested Lasix  20 twice daily alternating with 20 daily - Metoprolol  succinate 25 daily in the evening for palpitations overnight - If symptoms  persist may need transesophageal echo to evaluate prosthetic aortic valve  Coronary artery disease with stable angina History of CABG - Myoview  as above  Paroxysmal tachycardia Reports having some tachycardia while sleeping - Recommend starting metoprolol  succinate 25 mg in the evening  Aortic valve disease, stenosis S/p TAVR echocardiogram performed June 2023, mean gradient low 20 range -Most recent echocardiogram with gradient 32 mmHg -Also with worsening shortness of breath past several weeks, workup as above -If symptoms persist may need transesophageal echo  Hyperlipidemia On Crestor  20 daily Cholesterol at goal  Leg edema:  Prior DVT on  the left 2012 Leg swelling worse on the left than the right Lasix  20 twice daily alternating with 20 daily as detailed above Compression hose when traveling   Signed, Velinda Lunger, M.D., Ph.D. [facility] [place], [place] [phone]

## 2024-01-03 NOTE — Telephone Encounter (Signed)
 Called and spoke with patient. Notified him of the following from Dr. Gollan.  Appears he is scheduled to see Cadence in a week or so Might need Myoview  given underlying coronary disease If that is unrevealing may need TEE to look at his aortic valve where the mean gradient is trending higher now in the moderate to severe range if accurate Thx TGollan  Patient verbalizes understanding.

## 2024-01-04 ENCOUNTER — Other Ambulatory Visit: Payer: Self-pay | Admitting: Cardiovascular Disease

## 2024-01-04 ENCOUNTER — Telehealth: Payer: Self-pay | Admitting: Cardiovascular Disease

## 2024-01-04 MED ORDER — FUROSEMIDE 20 MG PO TABS: ORAL_TABLET | ORAL | 1 refills | Status: DC

## 2024-01-04 NOTE — Telephone Encounter (Signed)
 Pt c/o medication issue:  1. Name of Medication:   metoprolol  succinate (TOPROL -XL) 25 MG 24 hr tablet    2. How are you currently taking this medication (dosage and times per day)? unknown  3. Are you having a reaction (difficulty breathing--STAT)? no  4. What is your medication issue? Pt's spouse called to verify medication please advise

## 2024-01-04 NOTE — Telephone Encounter (Signed)
 Fax rec'vd from [facility] pharmacy wanting clarification on directions of furosemide . Fax scanned to media

## 2024-01-04 NOTE — Telephone Encounter (Signed)
 Called and spoke with wife per DPR. All questions answers regarding Metoprolol . Wife verbalizes understanding and appreciation for call.

## 2024-01-05 ENCOUNTER — Encounter
Admission: RE | Admit: 2024-01-05 | Discharge: 2024-01-05 | Disposition: A | Discharge status: Home / Self Care | Source: Ambulatory Visit | Attending: Cardiovascular Disease | Admitting: Cardiovascular Disease

## 2024-01-05 ENCOUNTER — Ambulatory Visit (HOSPITAL_COMMUNITY)

## 2024-01-05 DIAGNOSIS — R0602 Shortness of breath: Secondary | ICD-10-CM | POA: Diagnosis present

## 2024-01-05 DIAGNOSIS — I25118 Atherosclerotic heart disease of native coronary artery with other forms of angina pectoris: Secondary | ICD-10-CM | POA: Diagnosis not present

## 2024-01-05 LAB — NM MYOCAR MULTI W/SPECT W/WALL MOTION / EF
LV dias vol: 284 mL (ref 62–150)
LV sys vol: 199 mL (ref 4.2–5.8)
MPHR: 137 {beats}/min
Nuc Stress EF: 30 %
Peak HR: 81 {beats}/min
Percent HR: 59 %
Rest HR: 70 {beats}/min
Rest Nuclear Isotope Dose: 10 mCi
SDS: 6
SRS: 1
SSS: 4
ST Depression (mm): 0 mm
Stress Nuclear Isotope Dose: 31.5 mCi
TID: 1.12

## 2024-01-05 MED ORDER — TECHNETIUM TC 99M TETROFOSMIN IV KIT
31.4600 | PACK | Freq: Once | INTRAVENOUS | Status: AC | PRN
  Administered 2024-01-05: 31.46 via INTRAVENOUS

## 2024-01-05 MED ORDER — TECHNETIUM TC 99M TETROFOSMIN IV KIT
10.0000 | PACK | Freq: Once | INTRAVENOUS | Status: AC | PRN
  Administered 2024-01-05: 9.95 via INTRAVENOUS

## 2024-01-05 MED ORDER — REGADENOSON 0.4 MG/5ML IV SOLN
0.4000 mg | Freq: Once | INTRAVENOUS | Status: AC
  Administered 2024-01-05: 0.4 mg via INTRAVENOUS

## 2024-01-09 ENCOUNTER — Ambulatory Visit: Payer: Self-pay | Admitting: Cardiovascular Disease

## 2024-01-13 ENCOUNTER — Ambulatory Visit: Admitting: Medical

## 2024-01-14 ENCOUNTER — Telehealth: Payer: Self-pay | Admitting: Cardiovascular Disease

## 2024-01-14 NOTE — Telephone Encounter (Signed)
 Pt wife wants to know how his medication furosemide  (LASIX ) 20 MG tablet should be taken. She questions why he would be going down to taking 20 mg every other day if he was taking 40 mg before if he needs to fix the fluid. Please advise.

## 2024-01-14 NOTE — Telephone Encounter (Signed)
 Called and spoke with wife per DPR. Gave wife updated instructions on Lasix . Wife verbalizes understanding.

## 2024-01-18 ENCOUNTER — Ambulatory Visit
Admission: RE | Admit: 2024-01-18 | Discharge: 2024-01-18 | Disposition: A | Source: Ambulatory Visit | Attending: Family Medicine | Admitting: Family Medicine

## 2024-01-18 ENCOUNTER — Encounter: Payer: Self-pay | Admitting: Family Medicine

## 2024-01-18 ENCOUNTER — Other Ambulatory Visit: Payer: Self-pay | Admitting: Family Medicine

## 2024-01-18 DIAGNOSIS — R0602 Shortness of breath: Secondary | ICD-10-CM

## 2024-01-18 DIAGNOSIS — Z86718 Personal history of other venous thrombosis and embolism: Secondary | ICD-10-CM

## 2024-01-18 MED ORDER — IOPAMIDOL (ISOVUE-370) INJECTION 76%
100.0000 mL | Freq: Once | INTRAVENOUS | Status: AC | PRN
  Administered 2024-01-18: 100 mL via INTRAVENOUS

## 2024-01-19 ENCOUNTER — Other Ambulatory Visit: Payer: Self-pay

## 2024-01-19 ENCOUNTER — Encounter: Payer: Self-pay | Admitting: Emergency Medicine

## 2024-01-19 ENCOUNTER — Inpatient Hospital Stay
Admission: EM | Admit: 2024-01-19 | Discharge: 2024-01-22 | DRG: 291 | Disposition: A | Attending: Student | Admitting: Student

## 2024-01-19 ENCOUNTER — Emergency Department

## 2024-01-19 DIAGNOSIS — I509 Heart failure, unspecified: Principal | ICD-10-CM

## 2024-01-19 DIAGNOSIS — I5033 Acute on chronic diastolic (congestive) heart failure: Secondary | ICD-10-CM | POA: Diagnosis not present

## 2024-01-19 LAB — BASIC METABOLIC PANEL WITH GFR
Anion gap: 7 (ref 5–15)
BUN: 20 mg/dL (ref 8–23)
CO2: 28 mmol/L (ref 22–32)
Calcium: 9.9 mg/dL (ref 8.9–10.3)
Chloride: 104 mmol/L (ref 98–111)
Creatinine, Ser: 0.85 mg/dL (ref 0.61–1.24)
GFR, Estimated: >60 mL/min (ref >60)
Glucose, Bld: 108 mg/dL — ABNORMAL HIGH (ref 70–99)
Potassium: 4.6 mmol/L (ref 3.5–5.1)
Sodium: 140 mmol/L (ref 135–145)

## 2024-01-19 LAB — CBC
HCT: 40.7 % (ref 39.0–52.0)
Hemoglobin: 13.8 g/dL (ref 13.0–17.0)
MCH: 32.1 pg (ref 26.0–34.0)
MCHC: 33.9 g/dL (ref 30.0–36.0)
MCV: 94.7 fL (ref 80.0–100.0)
Platelets: 171 K/uL (ref 150–400)
RBC: 4.3 MIL/uL (ref 4.22–5.81)
RDW: 14.6 % (ref 11.5–15.5)
WBC: 7.6 K/uL (ref 4.0–10.5)
nRBC: 0 % (ref 0.0–0.2)

## 2024-01-19 LAB — TROPONIN T, HIGH SENSITIVITY
Troponin T High Sensitivity: 99 ng/L — ABNORMAL HIGH (ref 0–19)
Troponin T High Sensitivity: 117 ng/L (ref 0–19)
Troponin T High Sensitivity: 101 ng/L (ref 0–19)
Troponin T High Sensitivity: 108 ng/L (ref 0–19)

## 2024-01-19 LAB — MAGNESIUM: Magnesium: 2 mg/dL (ref 1.7–2.4)

## 2024-01-19 LAB — PRO BRAIN NATRIURETIC PEPTIDE: Pro Brain Natriuretic Peptide: 1383 pg/mL — ABNORMAL HIGH (ref <300.0)

## 2024-01-19 MED ORDER — METOPROLOL SUCCINATE ER 25 MG PO TB24
25.0000 mg | ORAL_TABLET | Freq: Every day | ORAL | Status: DC
  Administered 2024-01-20 – 2024-01-22 (×3): 25 mg via ORAL
  Filled 2024-01-19 (×3): qty 1

## 2024-01-19 MED ORDER — ASPIRIN 81 MG PO CHEW
81.0000 mg | CHEWABLE_TABLET | Freq: Every day | ORAL | Status: DC
  Administered 2024-01-20 – 2024-01-22 (×3): 81 mg via ORAL
  Filled 2024-01-19 (×3): qty 1

## 2024-01-19 MED ORDER — FUROSEMIDE 10 MG/ML IJ SOLN
40.0000 mg | Freq: Once | INTRAMUSCULAR | Status: AC
  Administered 2024-01-19: 40 mg via INTRAVENOUS
  Filled 2024-01-19: qty 4

## 2024-01-19 MED ORDER — ACETAMINOPHEN 650 MG RE SUPP: 650.0000 mg | Freq: Four times a day (QID) | RECTAL | Status: DC | PRN

## 2024-01-19 MED ORDER — ENOXAPARIN SODIUM 60 MG/0.6ML IJ SOSY
0.5000 mg/kg | PREFILLED_SYRINGE | INTRAMUSCULAR | Status: DC
  Administered 2024-01-19 – 2024-01-21 (×3): 47.5 mg via SUBCUTANEOUS
  Filled 2024-01-19 (×4): qty 0.6

## 2024-01-19 MED ORDER — ACETAMINOPHEN 325 MG PO TABS: 650.0000 mg | ORAL_TABLET | Freq: Four times a day (QID) | ORAL | Status: DC | PRN

## 2024-01-19 MED ORDER — AMLODIPINE BESYLATE 10 MG PO TABS
10.0000 mg | ORAL_TABLET | Freq: Every day | ORAL | Status: DC
  Administered 2024-01-20 – 2024-01-22 (×3): 10 mg via ORAL
  Filled 2024-01-19: qty 1
  Filled 2024-01-19 (×2): qty 2

## 2024-01-19 NOTE — ED Notes (Signed)
 Pt given lunch tray.

## 2024-01-19 NOTE — ED Triage Notes (Signed)
 Patient arrives with spouse c/o shortness of breath ongoing for weeks. States they have seen cardiology and had lasix  doubled however symptoms not improving. Reports cannot lay flat at night or walk any distance without breathing labored. Reports swelling in abdomen and legs.

## 2024-01-19 NOTE — ED Notes (Signed)
 Hospitalist at bedside

## 2024-01-19 NOTE — ED Provider Notes (Signed)
 [facility] Provider Note    Event Date/Time   First MD Initiated Contact with Patient 01/19/24 1010     (approximate)   History   Shortness of Breath   HPI  Jacob Montes is a 83 y.o. male with a past medical history of triple bypass, TAVR, presenting to the emergency department complaining of shortness of breath that has been worsening over the last several weeks.  The patient reports that he has doubled his Lasix  but that it does not seem to be helping his symptoms.  He reports that he is unable to lay flat at night and that he has significant swelling in his legs and abdomen.  He denies any chest pain, cough, congestion, dizziness, or lightheadedness.     Physical Exam   Triage Vital Signs: ED Triage Vitals  Encounter Vitals Group     BP 01/19/24 0847 138/61     Girls Systolic BP Percentile --      Girls Diastolic BP Percentile --      Boys Systolic BP Percentile --      Boys Diastolic BP Percentile --      Pulse Rate 01/19/24 0847 87     Resp 01/19/24 0847 18     Temp 01/19/24 0847 98 F (36.7 C)     Temp Source 01/19/24 0847 Oral     SpO2 01/19/24 0847 91 %     Weight 01/19/24 0847 208 lb (94.3 kg)     Height 01/19/24 0847 5' 6 (1.676 m)     Head Circumference --      Peak Flow --      Pain Score 01/19/24 0854 0     Pain Loc --      Pain Education --      Exclude from Growth Chart --     Most recent vital signs: Vitals:   01/19/24 0847  BP: 138/61  Pulse: 87  Resp: 18  Temp: 98 F (36.7 C)  SpO2: 91%     General: Awake, no distress.  CV:  Good peripheral perfusion.  Regular rate and rhythm Resp:  Normal effort.  Decreased breath sounds at bilateral bases Abd:  Mild distention.  Nontender to palpation Other:  2+ pitting edema to bilateral lower extremities   ED Results / Procedures / Treatments   Labs (all labs ordered are listed, but only abnormal results are displayed) Labs Reviewed  BASIC METABOLIC PANEL WITH GFR  - Abnormal; Notable for the following components:      Result Value   Glucose, Bld 108 (*)    All other components within normal limits  PRO BRAIN NATRIURETIC PEPTIDE - Abnormal; Notable for the following components:   Pro Brain Natriuretic Peptide 1,383.0 (*)    All other components within normal limits  CBC     EKG  ED ECG REPORT I, Reche CHRISTELLA Leventhal, the attending physician, personally viewed and interpreted this ECG.  Date: 01/19/2024 Rate: 80 Rhythm: normal sinus rhythm QRS Axis: normal Intervals: normal ST/T Wave abnormalities: LBBB Narrative Interpretation: no evidence of acute ischemia    RADIOLOGY X-ray interpreted by myself as cardiomegaly with pleural effusions.  Radiologist is in agreement with my interpretation and adds pulmonary interstitial edema    PROCEDURES:  Critical Care performed: No  Procedures   MEDICATIONS ORDERED IN ED: Medications - No data to display   IMPRESSION / MDM / ASSESSMENT AND PLAN / ED COURSE  I reviewed the triage vital signs and the  nursing notes.                              Differential diagnosis includes, but is not limited to, CHF exacerbation, pleural effusions, pulmonary edema, CAD  Patient's presentation is most consistent with exacerbation of chronic illness.  This patient is an 83 year old male with past medical history of CAD, CABG, TAVR, presenting to the emergency department complaining of worsening shortness of breath which has been ongoing for the last several weeks.  On workup today he is noted to have an elevated BNP as well as pulmonary edema and pleural effusions on chest x-ray.  IV Lasix  ordered.  Troponin is slightly elevated at 99.  EKG does not show any concerning ischemic changes.  Left bundle branch block which was present on previous EKGs.  Discussed with patient plan for admission for further workup and treatment he is agreeable.  I discussed patient's case with the hospitalist who is in agreement  with plan for admission at this time.      FINAL CLINICAL IMPRESSION(S) / ED DIAGNOSES   Final diagnoses:  Acute on chronic congestive heart failure, unspecified heart failure type (HCC)     Rx / DC Orders   ED Discharge Orders     None        Note:  This document was prepared using Dragon voice recognition software and may include unintentional dictation errors.   [place] Reche HERO, MD 01/19/24 1224

## 2024-01-19 NOTE — Hospital Course (Signed)
 83 yo   CHF exacerbation SOB, orthopnea, swelling in legs,   Doubled up lasix    XCR vascular congestion, small effusions  NO CP

## 2024-01-19 NOTE — Progress Notes (Signed)
 PHARMACIST - PHYSICIAN COMMUNICATION  CONCERNING:  Enoxaparin  (Lovenox ) for DVT Prophylaxis    RECOMMENDATION: Patient was prescribed enoxaprin 40mg  q24 hours for VTE prophylaxis.   Filed Weights   01/19/24 0847  Weight: 94.3 kg (208 lb)    Body mass index is 33.57 kg/m.  Estimated Creatinine Clearance: 70.8 mL/min (by C-G formula based on SCr of 0.85 mg/dL).   Based on [facility] policy patient is candidate for enoxaparin  0.5mg /kg TBW SQ every 24 hours based on BMI being >30.  DESCRIPTION: Pharmacy has adjusted enoxaparin  dose per [facility] policy.  Patient is now receiving enoxaparin  47.5 mg every 24 hours    Damien Napoleon, PharmD Clinical Pharmacist  01/19/2024 12:30 PM

## 2024-01-19 NOTE — H&P (Signed)
 History and Physical    Patient: Jacob Montes FMW:[id number] DOB: January 08, 1941 DOA: 01/19/2024 DOS: the patient was seen and examined on 01/19/2024 PCP: [name], MD  Patient coming from: Home  Chief Complaint:  Chief Complaint  Patient presents with   Shortness of Breath   HPI: Jacob Montes is a 83 y.o. 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  He presented with 2 weeks of shortness of breath, feeling bloated, and worsening BLE edema.   Patient stated that his symptoms have been going on for the past 2 weeks.  He states that he is dyspnea with minimal exertion, such as going to the toilet from his bedroom.  He denies any dyspnea at rest.  He also notes some worsening orthopnea and bilateral lower extremity edema over this time.  Patient also notes that he has some early satiety.  He states that he saw his primary care doctor and outpatient cardiologist.  He had a recent TTE which showed worsening of his prosthetic aortic valve and a Lexiscan  Myoview  with no evidence of significant ischemia.  Patient also increased his Lasix  to 20 mg twice daily with no improvement in his symptoms.  He discussed his worsening symptoms with his primary care physician who recommended that he present to the hospital for further evaluation.  Of note, patient does state that he has had some dietary indiscretion over the last week or so noting that he has had increased sodium intake.  ED course:  Normotensive, O2 sats 91% on room air. Troponin T 99, proBNP ~1300. EKG inverted T waves in Leads I II, x-ray with bilateral pleural effusions and interstitial pulmonary edema. Cta PE  w/p evidence of PE. He was given 1x dose of IV lasix  40mg .   Review of Systems: As mentioned in the history of present illness. All  other systems reviewed and are negative. Past Medical History:  Diagnosis Date   Actinic keratosis    Acute exacerbation of CHF (congestive heart failure) (HCC) 01/19/2024   Aortic stenosis    a. s/p TAVR 03/2017   CAD (coronary artery disease)    a. s/p 3-v CABG in 07/2007   Carotid artery disease    a. 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   GERD (gastroesophageal reflux disease)    HLD (hyperlipidemia)    Hyperlipidemia 10/15/2015   Hypertension    Nephrolithiasis 11/16/2018   Pulmonary embolism (HCC) 08/2010   Skin cancer    Sleep apnea    Stroke [facility])    Past Surgical History:  Procedure Laterality Date   CARDIAC CATHETERIZATION     Carotid surgery Left 03/08/2007   CHOLECYSTECTOMY  02/16/2018   COLONOSCOPY WITH PROPOFOL  N/A 09/01/2022   Procedure: COLONOSCOPY WITH PROPOFOL ;  Surgeon: Maryruth Ole DASEN, MD;  Location: [facility] ENDOSCOPY;  Service: Endoscopy;  Laterality: N/A;   ENDOSCOPIC RETROGRADE CHOLANGIOPANCREATOGRAPHY (ERCP) WITH PROPOFOL  N/A 07/10/2021   Procedure: ENDOSCOPIC RETROGRADE CHOLANGIOPANCREATOGRAPHY (ERCP) WITH PROPOFOL ;  Surgeon: Jinny Carmine, MD;  Location: [facility] ENDOSCOPY;  Service: Endoscopy;  Laterality: N/A;   ERCP N/A 11/18/2018   Procedure: ENDOSCOPIC RETROGRADE CHOLANGIOPANCREATOGRAPHY (ERCP);  Surgeon: Jinny Carmine, MD;  Location: [facility] ENDOSCOPY;  Service: Endoscopy;  Laterality: N/A;   EUS N/A 08/21/2021  Procedure: UPPER ENDOSCOPIC ULTRASOUND (EUS) LINEAR;  Surgeon: Queenie Asberry LABOR, MD;  Location: [facility] ENDOSCOPY;  Service: Gastroenterology;  Laterality: N/A;  [facility]   Heart Bypass N/A 07/2007   left shoulder surgery Left 06/03/2009   POLYPECTOMY  09/01/2022   Procedure: POLYPECTOMY;  Surgeon: Maryruth Ole DASEN, MD;  Location: [facility] ENDOSCOPY;  Service: Endoscopy;;   RIGHT AND LEFT HEART CATH  07/18/2007   right shoulder surgery   2003   TEE WITHOUT CARDIOVERSION N/A 11/21/2018   Procedure: TRANSESOPHAGEAL ECHOCARDIOGRAM (TEE);  Surgeon: Darron Deatrice LABOR, MD;  Location: [facility] ORS;  Service: Cardiovascular;  Laterality: N/A;   TVAR     Social History:  reports that he quit smoking about 46 years ago. His smoking use included cigarettes. He has never used smokeless tobacco. He reports that he does not drink alcohol and does not use drugs.  Allergies  Allergen Reactions   Atorvastatin Other (See Comments)    aches  myalgia  myalgia    aches  myalgia   Ramipril Other (See Comments) and Cough    cough    Family History  Problem Relation Age of Onset   Hypertension Mother    Stroke Mother    Lung cancer Father    Non-Hodgkin's lymphoma Brother     Prior to Admission medications   Medication Sig Start Date End Date Taking? Authorizing Provider  amLODipine  (NORVASC ) 10 MG tablet Take 1 tablet by mouth once daily 06/04/21  Yes [name], MD  aspirin  81 MG chewable tablet Chew 81 mg by mouth daily.   Yes [provider]  docusate sodium  (COLACE) 250 MG capsule Take 250 mg by mouth daily as needed for constipation.    Yes [provider]  fluticasone (FLONASE) 50 MCG/ACT nasal spray Place 2 sprays into both nostrils daily as needed for allergies or rhinitis. 11/05/23 11/04/24 Yes [provider]  furosemide  (LASIX ) 20 MG tablet Take 40 MG ( 2 tablet) daily alternating with 20 MG (1 tablet) once daily. 01/04/24  Yes Gollan, Timothy J, MD  metoprolol  succinate (TOPROL -XL) 25 MG 24 hr tablet Take 1 tablet (25 mg total) by mouth daily. Take with or immediately following a meal. 01/03/24 12/28/24 Yes Gollan, Timothy J, MD  montelukast  (SINGULAIR ) 10 MG tablet Take 1 tablet (10 mg total) by mouth at bedtime as needed (allergies). 05/22/19  Yes [name], MD  Multiple Vitamin (MULTIVITAMIN) tablet Take 1 tablet by mouth daily.   Yes [provider]  omeprazole (PRILOSEC)  20 MG capsule Take 20 mg by mouth daily. As needed   Yes [provider]  ondansetron  (ZOFRAN -ODT) 4 MG disintegrating tablet Take 1 tablet (4 mg total) by mouth every 8 (eight) hours as needed for nausea or vomiting. 05/20/22  Yes Dorothyann Drivers, MD  potassium chloride  (KLOR-CON ) 10 MEQ tablet Take 1 tablet (10 mEq total) by mouth daily. Take with lasix  09/14/23  Yes Gollan, Timothy J, MD  rosuvastatin  (CRESTOR ) 20 MG tablet Take 1 tablet (20 mg total) by mouth daily. 12/17/21  Yes Simmons-Robinson, Makiera, MD  cetirizine (ZYRTEC) 10 MG tablet Take 10 mg by mouth daily. 11/05/23   [provider]  Lactobacillus (PROBIOTIC ACIDOPHILUS PO) Take by mouth daily.    [provider]  tamsulosin  (FLOMAX ) 0.4 MG CAPS capsule Take 1 capsule by mouth once daily Patient not taking: Reported on 01/03/2024 12/18/20   [name], MD    Physical Exam: Vitals:   01/19/24 1100 01/19/24 1200 01/19/24  1300 01/19/24 1311  BP: (!) 119/47 (!) 122/52 (!) 132/56   Pulse: 82 83 83   Resp: 14 18 (!) 22   Temp:    (!) 97.5 F (36.4 C)  TempSrc:    Oral  SpO2: 96% 93% 92%   Weight:      Height:       Physical Exam  Constitutional: In no distress.  Cardiovascular: Normal rate, regular rhythm. 2+ bilateral lower extremity edema up to hips, elevated JVP  Pulmonary: Non labored breathing on room air, no wheezing or rales.   Abdominal: Soft. Non distended and non tender Musculoskeletal: Normal range of motion.     Neurological: Alert and oriented to person, place, and time. Non focal  Skin: Skin is warm and dry.   Data Reviewed:     Latest Ref Rng & Units 01/19/2024    8:48 AM 03/04/2023    1:11 PM 05/20/2022    5:46 PM  BMP  Glucose 70 - 99 mg/dL 891  90  889   BUN 8 - 23 mg/dL 20  16  16    Creatinine 0.61 - 1.24 mg/dL 9.14  9.18  9.17   Sodium 135 - 145 mmol/L 140  139  137   Potassium 3.5 - 5.1 mmol/L 4.6  4.5  4.1   Chloride 98 - 111 mmol/L 104  105  103   CO2 22 -  32 mmol/L 28  23  26    Calcium  8.9 - 10.3 mg/dL 9.9  9.0  9.3       Latest Ref Rng & Units 01/19/2024    8:48 AM 03/04/2023    1:43 PM 03/04/2023    1:11 PM  CBC  WBC 4.0 - 10.5 K/uL 7.6  8.0  SPECIMEN CLOTTED   Hemoglobin 13.0 - 17.0 g/dL 86.1  86.2    Hematocrit 39.0 - 52.0 % 40.7  39.1    Platelets 150 - 400 K/uL 171  110     Pro BNP 1383  Assessment and Plan: Acute on chronic HF exacerbation Likely due to dietary indiscretion.  TTE 11/2023 LVEF 50 to 55%, Unable to evaluate for RWMA,  G2DD, RV sys fxn nl, Mild to mod MR, prostehetic valve present mean gradient 32 from 23 (07/2021).  -Continue IV lasix  -Daily weights, strict I/Os -Resume metoprolol  given not in cardiogenic shock    CAD s/p 3v CABG PAD CAS s/p L carotid endarterectomy  Continue statin and aspirin     S/p TAVR 01/2017  Follows with cardiology outpatient.    HLD HTN Chronic continue home medications  H/o DVT/PE Not on [facility] currently   Advance Care Planning:   Code Status: Full Code   Consults: None  Family Communication: NOne  Severity of Illness: The appropriate patient status for this patient is OBSERVATION. Observation status is judged to be reasonable and necessary in order to provide the required intensity of service to ensure the patient's safety. The patient's presenting symptoms, physical exam findings, and initial radiographic and laboratory data in the context of their medical condition is felt to place them at decreased risk for further clinical deterioration. Furthermore, it is anticipated that the patient will be medically stable for discharge from the hospital within 2 midnights of admission.   Author: Alban Pepper, MD 01/19/2024 4:00 PM  For on call review [email or web address].

## 2024-01-20 ENCOUNTER — Observation Stay

## 2024-01-20 DIAGNOSIS — Z7982 Long term (current) use of aspirin: Secondary | ICD-10-CM | POA: Diagnosis not present

## 2024-01-20 DIAGNOSIS — E876 Hypokalemia: Secondary | ICD-10-CM | POA: Diagnosis present

## 2024-01-20 DIAGNOSIS — Z87891 Personal history of nicotine dependence: Secondary | ICD-10-CM | POA: Diagnosis not present

## 2024-01-20 DIAGNOSIS — Z952 Presence of prosthetic heart valve: Secondary | ICD-10-CM | POA: Diagnosis not present

## 2024-01-20 DIAGNOSIS — E66811 Obesity, class 1: Secondary | ICD-10-CM | POA: Diagnosis present

## 2024-01-20 DIAGNOSIS — F32A Depression, unspecified: Secondary | ICD-10-CM | POA: Diagnosis present

## 2024-01-20 DIAGNOSIS — I110 Hypertensive heart disease with heart failure: Secondary | ICD-10-CM | POA: Diagnosis present

## 2024-01-20 DIAGNOSIS — Z6833 Body mass index (BMI) 33.0-33.9, adult: Secondary | ICD-10-CM | POA: Diagnosis not present

## 2024-01-20 DIAGNOSIS — I5033 Acute on chronic diastolic (congestive) heart failure: Secondary | ICD-10-CM | POA: Diagnosis present

## 2024-01-20 DIAGNOSIS — Z888 Allergy status to other drugs, medicaments and biological substances status: Secondary | ICD-10-CM | POA: Diagnosis not present

## 2024-01-20 DIAGNOSIS — G4733 Obstructive sleep apnea (adult) (pediatric): Secondary | ICD-10-CM | POA: Diagnosis present

## 2024-01-20 DIAGNOSIS — I509 Heart failure, unspecified: Secondary | ICD-10-CM | POA: Diagnosis present

## 2024-01-20 DIAGNOSIS — E785 Hyperlipidemia, unspecified: Secondary | ICD-10-CM | POA: Diagnosis present

## 2024-01-20 DIAGNOSIS — Z86718 Personal history of other venous thrombosis and embolism: Secondary | ICD-10-CM | POA: Diagnosis not present

## 2024-01-20 DIAGNOSIS — F419 Anxiety disorder, unspecified: Secondary | ICD-10-CM | POA: Diagnosis present

## 2024-01-20 DIAGNOSIS — I350 Nonrheumatic aortic (valve) stenosis: Secondary | ICD-10-CM | POA: Diagnosis present

## 2024-01-20 DIAGNOSIS — Z951 Presence of aortocoronary bypass graft: Secondary | ICD-10-CM | POA: Diagnosis not present

## 2024-01-20 DIAGNOSIS — I2510 Atherosclerotic heart disease of native coronary artery without angina pectoris: Secondary | ICD-10-CM | POA: Diagnosis present

## 2024-01-20 DIAGNOSIS — Z86711 Personal history of pulmonary embolism: Secondary | ICD-10-CM | POA: Diagnosis not present

## 2024-01-20 DIAGNOSIS — Z85828 Personal history of other malignant neoplasm of skin: Secondary | ICD-10-CM | POA: Diagnosis not present

## 2024-01-20 DIAGNOSIS — Z7984 Long term (current) use of oral hypoglycemic drugs: Secondary | ICD-10-CM | POA: Diagnosis not present

## 2024-01-20 DIAGNOSIS — Z8249 Family history of ischemic heart disease and other diseases of the circulatory system: Secondary | ICD-10-CM | POA: Diagnosis not present

## 2024-01-20 DIAGNOSIS — K5900 Constipation, unspecified: Secondary | ICD-10-CM | POA: Diagnosis present

## 2024-01-20 DIAGNOSIS — E1151 Type 2 diabetes mellitus with diabetic peripheral angiopathy without gangrene: Secondary | ICD-10-CM | POA: Diagnosis present

## 2024-01-20 DIAGNOSIS — R6881 Early satiety: Secondary | ICD-10-CM | POA: Diagnosis present

## 2024-01-20 LAB — CBC
HCT: 37.4 % — ABNORMAL LOW (ref 39.0–52.0)
Hemoglobin: 12.9 g/dL — ABNORMAL LOW (ref 13.0–17.0)
MCH: 32.6 pg (ref 26.0–34.0)
MCHC: 34.5 g/dL (ref 30.0–36.0)
MCV: 94.4 fL (ref 80.0–100.0)
Platelets: 145 K/uL — ABNORMAL LOW (ref 150–400)
RBC: 3.96 MIL/uL — ABNORMAL LOW (ref 4.22–5.81)
RDW: 14.2 % (ref 11.5–15.5)
WBC: 6.7 K/uL (ref 4.0–10.5)
nRBC: 0 % (ref 0.0–0.2)

## 2024-01-20 LAB — BASIC METABOLIC PANEL WITH GFR
Anion gap: 10 (ref 5–15)
BUN: 22 mg/dL (ref 8–23)
CO2: 26 mmol/L (ref 22–32)
Calcium: 9.1 mg/dL (ref 8.9–10.3)
Chloride: 104 mmol/L (ref 98–111)
Creatinine, Ser: 0.75 mg/dL (ref 0.61–1.24)
GFR, Estimated: >60 mL/min (ref >60)
Glucose, Bld: 89 mg/dL (ref 70–99)
Potassium: 3.6 mmol/L (ref 3.5–5.1)
Sodium: 141 mmol/L (ref 135–145)

## 2024-01-20 MED ORDER — FUROSEMIDE 10 MG/ML IJ SOLN
60.0000 mg | Freq: Once | INTRAMUSCULAR | Status: AC
  Administered 2024-01-20: 60 mg via INTRAVENOUS
  Filled 2024-01-20: qty 8

## 2024-01-20 MED ORDER — ONDANSETRON HCL 4 MG/2ML IJ SOLN
4.0000 mg | Freq: Four times a day (QID) | INTRAMUSCULAR | Status: DC | PRN
  Filled 2024-01-20: qty 2

## 2024-01-20 MED ORDER — FUROSEMIDE 10 MG/ML IJ SOLN
40.0000 mg | Freq: Once | INTRAMUSCULAR | Status: AC
  Administered 2024-01-20: 40 mg via INTRAVENOUS
  Filled 2024-01-20: qty 4

## 2024-01-20 NOTE — ED Notes (Addendum)
 Patient refused to have the alarm on in the chair  I explained to patient how important we need to have this alarm patient stated I  don't use it at home but I will call for help before I get up  patient also had family in the room at this time. Notified nurse  Obie.

## 2024-01-20 NOTE — ED Notes (Signed)
 Patient resting in bed, no apparent distress noted, no needs voiced.

## 2024-01-20 NOTE — Care Management Obs Status (Signed)
 MEDICARE OBSERVATION STATUS NOTIFICATION   Patient Details  Name: Jacob Montes MRN: [id number] Date of Birth: 02-Apr-1940   Medicare Observation Status Notification Given:  Chaney BRANDY CHRISTIANE LELON, CMA 01/20/2024, 3:59 PM

## 2024-01-20 NOTE — Progress Notes (Signed)
 Progress Note   Patient: Jacob Montes FMW:[id number] DOB: [birth year] DOA: 01/19/2024     0 DOS: the patient was seen and examined on 01/20/2024   Brief hospital course: Per H&P HPI  DUWARD ALLBRITTON is a 83 y.o. 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  He presented with 2 weeks of shortness of breath, feeling bloated, and worsening BLE edema.    Patient stated that his symptoms have been going on for the past 2 weeks.  He states that he is dyspnea with minimal exertion, such as going to the toilet from his bedroom.  He denies any dyspnea at rest.  He also notes some worsening orthopnea and bilateral lower extremity edema over this time.  Patient also notes that he has some early satiety.  He states that he saw his primary care doctor and outpatient cardiologist.  He had a recent TTE which showed worsening of his prosthetic aortic valve and a Lexiscan  Myoview  with no evidence of significant ischemia.  Patient also increased his Lasix  to 20 mg twice daily with no improvement in his symptoms.   He discussed his worsening symptoms with his primary care physician who recommended that he present to the hospital for further evaluation.   Of note, patient does state that he has had some dietary indiscretion over the last week or so noting that he has had increased sodium intake.   ED course:  Normotensive, O2 sats 91% on room air. Troponin T 99, proBNP ~1300. EKG inverted T waves in Leads I II, x-ray with bilateral pleural effusions and interstitial pulmonary edema. Cta PE  w/p evidence of PE. He was given 1x dose of IV lasix  40mg .    Assessment and Plan: Acute on chronic HF exacerbation Likely due to dietary indiscretion.  TTE 11/2023 LVEF 50 to 55%, Unable to evaluate for RWMA,  G2DD, RV sys fxn nl,  Mild to mod MR, prostehetic valve present mean gradient 32 from 23 (07/2021).  Continue metoprolol  -Continue IV lasix  -Daily weights, strict I/Os   CAD s/p 3v CABG PAD CAS s/p L carotid endarterectomy  Continue statin and aspirin     S/p TAVR 01/2017  Follows with cardiology outpatient.  HLD HTN Chronic, conintue home medications  H/O DVT/PE  Not on [facility] currently.      Subjective: Patient feeling better this AM, orthopnea is improved. Able to lie flat now.   Physical Exam: Vitals:   01/20/24 0209 01/20/24 0230 01/20/24 0451 01/20/24 0800  BP: 117/60 (!) 113/46 (!) 108/46 (!) 113/49  Pulse: 94 86 91 84  Resp:   17 17  Temp:   97.6 F (36.4 C)   TempSrc:   Oral   SpO2: 94% 97% 97% 95%  Weight:      Height:       Physical Exam  Constitutional: In no distress.  Cardiovascular: Normal rate, regular rhythm. 2+ bilateral lower extremity edema up to the knee Pulmonary: Non labored breathing on room air, no wheezing or rales.   Abdominal: Soft. Non distended and non tender Musculoskeletal: Normal range of motion.     Neurological: Alert and oriented to person, place, and time. Non focal  Skin: Skin is warm and dry.   Data Reviewed:     Latest Ref Rng & Units 01/20/2024    4:53 AM 01/19/2024  8:48 AM 03/04/2023    1:43 PM  CBC  WBC 4.0 - 10.5 K/uL 6.7  7.6  8.0   Hemoglobin 13.0 - 17.0 g/dL 87.0  86.1  86.2   Hematocrit 39.0 - 52.0 % 37.4  40.7  39.1   Platelets 150 - 400 K/uL 145  171  110       Latest Ref Rng & Units 01/20/2024    4:53 AM 01/19/2024    8:48 AM 03/04/2023    1:11 PM  BMP  Glucose 70 - 99 mg/dL 89  891  90   BUN 8 - 23 mg/dL 22  20  16    Creatinine 0.61 - 1.24 mg/dL 9.24  9.14  9.18   Sodium 135 - 145 mmol/L 141  140  139   Potassium 3.5 - 5.1 mmol/L 3.6  4.6  4.5   Chloride 98 - 111 mmol/L 104  104  105   CO2 22 - 32 mmol/L 26  28  23    Calcium  8.9 - 10.3 mg/dL 9.1  9.9  9.0      Family Communication: Spouse at bedside    Disposition: Status is: Inpatient The patient will require care spanning > 2 midnights and should be moved to inpatient because: Requiring IV diuresis  Planned Discharge Destination: Home    Time spent: 35 minutes  Author: Alban Pepper, MD 01/20/2024 8:16 AM  For on call review [email or web address].

## 2024-01-21 LAB — BASIC METABOLIC PANEL WITH GFR
Anion gap: 9 (ref 5–15)
BUN: 21 mg/dL (ref 8–23)
CO2: 27 mmol/L (ref 22–32)
Calcium: 8.6 mg/dL — ABNORMAL LOW (ref 8.9–10.3)
Chloride: 106 mmol/L (ref 98–111)
Creatinine, Ser: 0.77 mg/dL (ref 0.61–1.24)
GFR, Estimated: >60 mL/min (ref >60)
Glucose, Bld: 88 mg/dL (ref 70–99)
Potassium: 3.4 mmol/L — ABNORMAL LOW (ref 3.5–5.1)
Sodium: 142 mmol/L (ref 135–145)

## 2024-01-21 LAB — BLOOD GAS, ARTERIAL
Acid-Base Excess: 3.2 mmol/L — ABNORMAL HIGH (ref 0.0–2.0)
Bicarbonate: 28.5 mmol/L — ABNORMAL HIGH (ref 20.0–28.0)
O2 Saturation: 89.2 %
Patient temperature: 37
pCO2 arterial: 45 mmHg (ref 32–48)
pH, Arterial: 7.41 (ref 7.35–7.45)
pO2, Arterial: 63 mmHg — ABNORMAL LOW (ref 83–108)

## 2024-01-21 LAB — MAGNESIUM: Magnesium: 1.9 mg/dL (ref 1.7–2.4)

## 2024-01-21 MED ORDER — POTASSIUM CHLORIDE 20 MEQ PO PACK
40.0000 meq | PACK | Freq: Once | ORAL | Status: AC
  Administered 2024-01-21: 40 meq via ORAL
  Filled 2024-01-21: qty 2

## 2024-01-21 MED ORDER — FUROSEMIDE 10 MG/ML IJ SOLN
60.0000 mg | Freq: Once | INTRAMUSCULAR | Status: AC
  Administered 2024-01-21: 60 mg via INTRAVENOUS
  Filled 2024-01-21: qty 6

## 2024-01-21 MED ORDER — FUROSEMIDE 10 MG/ML IJ SOLN
60.0000 mg | Freq: Once | INTRAMUSCULAR | Status: AC
  Administered 2024-01-21: 60 mg via INTRAVENOUS
  Filled 2024-01-21: qty 8

## 2024-01-21 MED ORDER — MAGNESIUM SULFATE 2 GM/50ML IV SOLN
2.0000 g | Freq: Once | INTRAVENOUS | Status: AC
  Administered 2024-01-21: 2 g via INTRAVENOUS
  Filled 2024-01-21: qty 50

## 2024-01-21 NOTE — Progress Notes (Signed)
 Mobility Specialist - Progress Note   Pre-mobility: SpO2-93%  During mobility: , SpO2-89% recovered to > 90% in less than 30 sec  Post-mobility: SPO2-92%   01/21/24 1700  Mobility  Activity Ambulated with assistance;Stood at bedside  Level of Assistance Standby assist, set-up cues, supervision of patient - no hands on  Assistive Device None  Distance Ambulated (ft) 100 ft  Range of Motion/Exercises Active;All extremities  Activity Response Tolerated well  Mobility visit 1 Mobility  Mobility Specialist Start Time (ACUTE ONLY) 1730  Mobility Specialist Stop Time (ACUTE ONLY) 1750  Mobility Specialist Time Calculation (min) (ACUTE ONLY) 20 min   Pt was standing up in room on RA with guest in the room with nurse. Pt agreed to mobility and O2 test. Pt O2 vitals were taken throughout activity. Pt is able today ambulate well. Pt did use recovery break to document vitals. Pt O2 did Desat to 89%, but recovered with breathing technique pursed breathing. Pt was able to ambulate back to the room with family and needs in reach.  Jacob Montes Mobility Specialist 01/21/2024, 5:55 PM

## 2024-01-21 NOTE — ED Notes (Signed)
 Pts nasal canula back on at this time due to low o2 stats in the high 80s

## 2024-01-21 NOTE — Progress Notes (Signed)
 REDS test performed x 2 with readings of 36% and 37%, suggesting mild volume overload.

## 2024-01-21 NOTE — TOC Initial Note (Addendum)
 Transition of Care [facility]) - Initial/Assessment Note    Patient Details  Name: Jacob Montes MRN: [id number] Date of Birth: 09-01-1940  Transition of Care [facility]) CM/SW Contact:    Nikoletta Varma L Gioia Ranes, LCSW Phone Number: 01/21/2024, 3:44 PM  Clinical Narrative:                  [facility] consult received. Patient may discharge today. Home Oxygen needed. CSW spoke with spouse and there was no preference for DME agency. Spouse agreeable to using [employer].   CSW spoke with [employer] rep and was advised a SAT test was needed in addition to the order. Secure chat updated to advise the medical team that a SAT test was needed and DME supply order.        Patient Goals and CMS Choice            Expected Discharge Plan and Services                                              Prior Living Arrangements/Services                       Activities of Daily Living      Permission Sought/Granted                  Emotional Assessment              Admission diagnosis:  Acute exacerbation of CHF (congestive heart failure) (HCC) [I50.9] Acute on chronic congestive heart failure, unspecified heart failure type [facility]) [I50.9] Patient Active Problem List   Diagnosis Date Noted   Acute exacerbation of CHF (congestive heart failure) (HCC) 01/19/2024   Fever 07/18/2021   Liver abscess 07/15/2021   Choledocholithiasis 07/10/2021   Elevated liver enzymes 07/09/2021   Acute cholangitis with choledocholithiasis 07/08/2021   Atypical chest pain 04/22/2021   History of CAD 04/22/2021   Paroxysmal tachycardia (HCC) 04/22/2021   History of transcatheter aortic valve replacement (TAVR) 04/22/2021   Morbid obesity (HCC) 04/22/2021   CKD (chronic kidney disease) 12/03/2020   Bacteremia    Common bile duct stone    Ascending cholangitis (HCC) 11/17/2018   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 11/16/2018   Dysmetabolic syndrome 11/16/2018   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   Hyperglycemia  11/16/2018   Tear of meniscus of left knee 05/24/2018   Constipation 10/07/2016   Aortic stenosis 10/15/2015   Benign essential hypertension 10/15/2015   Carotid stenosis, bilateral 10/15/2015   Hypogonadism, 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 [place] [address] Phone: [phone] Fax: [phone]     Social Drivers of Health (SDOH) Social History: SDOH Screenings   Food Insecurity: No Food Insecurity (12/02/2023)   Received from [facility]  Housing: Low Risk  (12/02/2023)   Received from [facility]  Transportation Needs: No Transportation Needs (12/02/2023)   Received from [facility]  Utilities: Not At Risk (12/02/2023)   Received from [facility]  Alcohol Screen: Low Risk (07/03/2021)  Depression (PHQ2-9): Low Risk (10/23/2021)  Financial Resource Strain: Low Risk  (12/02/2023)   Received from [facility]  Physical Activity: Inactive (12/02/2023)   Received from [facility]  Social Connections: Socially Integrated (11/05/2023)   Received from [facility]  Stress: No Stress Concern Present (11/05/2023)   Received from [facility]  Tobacco Use: Medium Risk (01/19/2024)  Health Literacy: Adequate Health Literacy (11/05/2023)   Received from [facility]    SDOH Interventions:     Readmission Risk Interventions     No data to display

## 2024-01-21 NOTE — Progress Notes (Signed)
 Patient continues to refuse bed alarm, states he is taking this lasix  and he needs to be able to go to the bathroom.  Urinal gving but states he going into the bathroom too.  Wanted alarm off.  Non compliant with alarm usage.Wife at bedside and educated about safety as well.

## 2024-01-21 NOTE — Progress Notes (Signed)
 Progress Note   Patient: Jacob Montes FMW:[id number] DOB: 1940-05-01 DOA: 01/19/2024     1 DOS: the patient was seen and examined on 01/21/2024   Brief hospital course: Per H&P HPI  Jacob Montes is a 83 y.o. 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  He presented with 2 weeks of shortness of breath, feeling bloated, and worsening BLE edema.    Patient stated that his symptoms have been going on for the past 2 weeks.  He states that he is dyspnea with minimal exertion, such as going to the toilet from his bedroom.  He denies any dyspnea at rest.  He also notes some worsening orthopnea and bilateral lower extremity edema over this time.  Patient also notes that he has some early satiety.  He states that he saw his primary care doctor and outpatient cardiologist.  He had a recent TTE which showed worsening of his prosthetic aortic valve and a Lexiscan  Myoview  with no evidence of significant ischemia.  Patient also increased his Lasix  to 20 mg twice daily with no improvement in his symptoms.   He discussed his worsening symptoms with his primary care physician who recommended that he present to the hospital for further evaluation.   Of note, patient does state that he has had some dietary indiscretion over the last week or so noting that he has had increased sodium intake.   ED course:  Normotensive, O2 sats 91% on room air. Troponin T 99, proBNP ~1300. EKG inverted T waves in Leads I II, x-ray with bilateral pleural effusions and interstitial pulmonary edema. Cta PE  w/p evidence of PE. He was given 1x dose of IV lasix  40mg .    Assessment and Plan: Acute on chronic HF exacerbation Likely due to dietary indiscretion.  TTE 11/2023 LVEF 50 to 55%, Unable to evaluate for RWMA,  G2DD, RV sys fxn nl,  Mild to mod MR, prostehetic valve present mean gradient 32 from 23 (07/2021).  Reds 36%.  Continues to have pitting lower extremity edema.  Patient noted to have oxygenation in the 80s after ambulation.  ABG was obtained which showed O2 saturation of 89% on room air -Continue IV lasix  -Patient will require home oxygen -Daily weights, strict I/Os  Hypokalemia Replace PRN   CAD s/p 3v CABG PAD CAS s/p L carotid endarterectomy  Continue statin and aspirin     S/p TAVR 01/2017  Follows with cardiology outpatient.  HLD HTN Chronic, conintue home medications  H/O DVT/PE  Not on [facility] currently.      Subjective: Feels breathing is improved, having some dyspnea after exertion though this also significantly proved from his presentation.  Physical Exam: Vitals:   01/21/24 1230 01/21/24 1415 01/21/24 1500 01/21/24 1552  BP: (!) 105/53 (!) 125/43 (!) 110/45 104/70  Pulse: 81 87 81 76  Resp: (!) 23 17 (!) 27 20  Temp:    98.2 F (36.8 C)  TempSrc:    Oral  SpO2: 93% 90% 92% 95%  Weight:      Height:       Physical Exam  Constitutional: In no distress.  Cardiovascular: Normal rate, regular rhythm. 2+ bilateral lower extremity edema  Pulmonary: Non labored breathing on room air, no wheezing or rales.   Abdominal: Soft. Non distended and non tender Musculoskeletal: Normal range of motion.  Neurological: Alert and oriented to person, place, and time. Non focal  Skin: Skin is warm and dry.    Data Reviewed:     Latest Ref Rng & Units 01/20/2024    4:53 AM 01/19/2024    8:48 AM 03/04/2023    1:43 PM  CBC  WBC 4.0 - 10.5 K/uL 6.7  7.6  8.0   Hemoglobin 13.0 - 17.0 g/dL 87.0  86.1  86.2   Hematocrit 39.0 - 52.0 % 37.4  40.7  39.1   Platelets 150 - 400 K/uL 145  171  110       Latest Ref Rng & Units 01/21/2024    7:49 AM 01/20/2024    4:53 AM 01/19/2024    8:48 AM  BMP  Glucose 70 - 99 mg/dL 88  89  891   BUN 8 - 23 mg/dL 21  22  20    Creatinine 0.61 - 1.24 mg/dL 9.22   9.24  9.14   Sodium 135 - 145 mmol/L 142  141  140   Potassium 3.5 - 5.1 mmol/L 3.4  3.6  4.6   Chloride 98 - 111 mmol/L 106  104  104   CO2 22 - 32 mmol/L 27  26  28    Calcium  8.9 - 10.3 mg/dL 8.6  9.1  9.9      Family Communication: Spouse at bedside   Disposition: Status is: Inpatient The patient will require care spanning > 2 midnights and should be moved to inpatient because: Requiring IV diuresis  Planned Discharge Destination: Home    Time spent: 35 minutes  Author: Alban Pepper, MD 01/21/2024 6:30 PM  For on call review [email or web address].

## 2024-01-21 NOTE — Progress Notes (Signed)
 Heart Failure Navigator Progress Note  Patient OTF at this time.  In route to room on the second floor. CHF Clinic appointment scheduled for 01/27/24 @ 8:30 AM. Navigator will try to reach out to patient before hospital discharge to provide HF education.   Navigator available for reassessment of patient.   Charmaine Pines, RN, BSN [facility] Heart Failure Navigator Secure Chat Only

## 2024-01-22 DIAGNOSIS — I5033 Acute on chronic diastolic (congestive) heart failure: Secondary | ICD-10-CM | POA: Diagnosis not present

## 2024-01-22 LAB — CBC
HCT: 38.8 % — ABNORMAL LOW (ref 39.0–52.0)
Hemoglobin: 13 g/dL (ref 13.0–17.0)
MCH: 32.1 pg (ref 26.0–34.0)
MCHC: 33.5 g/dL (ref 30.0–36.0)
MCV: 95.8 fL (ref 80.0–100.0)
Platelets: 152 K/uL (ref 150–400)
RBC: 4.05 MIL/uL — ABNORMAL LOW (ref 4.22–5.81)
RDW: 14.1 % (ref 11.5–15.5)
WBC: 7 K/uL (ref 4.0–10.5)
nRBC: 0 % (ref 0.0–0.2)

## 2024-01-22 LAB — BASIC METABOLIC PANEL WITH GFR
Anion gap: 6 (ref 5–15)
BUN: 24 mg/dL — ABNORMAL HIGH (ref 8–23)
CO2: 31 mmol/L (ref 22–32)
Calcium: 9.5 mg/dL (ref 8.9–10.3)
Chloride: 103 mmol/L (ref 98–111)
Creatinine, Ser: 0.87 mg/dL (ref 0.61–1.24)
GFR, Estimated: >60 mL/min (ref >60)
Glucose, Bld: 101 mg/dL — ABNORMAL HIGH (ref 70–99)
Potassium: 4.7 mmol/L (ref 3.5–5.1)
Sodium: 140 mmol/L (ref 135–145)

## 2024-01-22 LAB — PRO BRAIN NATRIURETIC PEPTIDE: Pro Brain Natriuretic Peptide: 1643 pg/mL — ABNORMAL HIGH (ref <300.0)

## 2024-01-22 LAB — MAGNESIUM: Magnesium: 2.2 mg/dL (ref 1.7–2.4)

## 2024-01-22 MED ORDER — EMPAGLIFLOZIN 10 MG PO TABS: 10.0000 mg | ORAL_TABLET | Freq: Every day | ORAL | 0 refills | Status: DC

## 2024-01-22 MED ORDER — FUROSEMIDE 20 MG PO TABS: ORAL_TABLET | ORAL | Status: DC

## 2024-01-22 NOTE — TOC Progression Note (Signed)
 Transition of Care [facility]) - Progression Note    Patient Details  Name: Jacob Montes MRN: [id number] Date of Birth: 10/01/1940  Transition of Care [facility]) CM/SW Contact  Victory Jackquline RAMAN, RN Phone Number: 01/22/2024, 8:27 AM  Clinical Narrative:  RNCM received a message via secure chat informing me that the patient needs oxygen delivered to bedside. RNCM sent an email to [employer] for oxygen order. RNCM will continue to follow for discharge planning needs.                        Expected Discharge Plan and Services                                               Social Drivers of Health (SDOH) Interventions SDOH Screenings   Food Insecurity: No Food Insecurity (01/21/2024)  Housing: Low Risk (01/21/2024)  Transportation Needs: No Transportation Needs (01/21/2024)  Utilities: Not At Risk (01/21/2024)  Alcohol Screen: Low Risk (07/03/2021)  Depression (PHQ2-9): Low Risk (10/23/2021)  Financial Resource Strain: Low Risk  (12/02/2023)   Received from [facility]  Physical Activity: Inactive (12/02/2023)   Received from [facility]  Social Connections: Socially Integrated (01/21/2024)  Stress: No Stress Concern Present (11/05/2023)   Received from [facility]  Tobacco Use: Medium Risk (01/19/2024)  Health Literacy: Adequate Health Literacy (11/05/2023)   Received from [facility]    Readmission Risk Interventions     No data to display

## 2024-01-22 NOTE — Discharge Instructions (Addendum)
 Please check your oxygen levels at rest and when walking. You should use between 1-2L of oxygen to keep your oxygen saturation >92%. You likely only need to wear your oxygen with activity. You will likely not need this long term. Your primary care doctor and cardiology team will be able to help you wean off of this.  Please also schedule a sleep study with your primary care physician as well.   Your lasix  will be increased to 40mg  daily until you see the HF team 12/18. Please continue to take your potassium with your lasix .   A new medication called jardiance  was also added. This medication helps your heart and also helps to manage your fluid levels. If you develop any lightheadedness or dizziness while taking increased lasix  and jardiance  please let your PCP or HF team know, your medications may need to be adjusted.

## 2024-01-24 ENCOUNTER — Telehealth: Payer: Self-pay | Admitting: Cardiovascular Disease

## 2024-01-24 NOTE — Discharge Summary (Addendum)
 Physician Discharge Summary   Patient: Jacob Montes MRN: [id number] DOB: 1940-05-13  Admit date:     01/19/2024  Discharge date: 01/22/2024  Discharge Physician: Alban Pepper   PCP: [name], MD   Recommendations at discharge:    BMP to for potassium and serum creatinine.   Discharge Diagnoses: Principal Problem:   Acute exacerbation of CHF (congestive heart failure) (HCC)  Resolved Problems:   * No resolved hospital problems. [place] Hospital Course: Per H&P HPI  Jacob Montes is a 83 y.o. 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  He presented with 2 weeks of shortness of breath, feeling bloated, and worsening BLE edema.    Patient stated that his symptoms have been going on for the past 2 weeks.  He states that he is dyspnea with minimal exertion, such as going to the toilet from his bedroom.  He denies any dyspnea at rest.  He also notes some worsening orthopnea and bilateral lower extremity edema over this time.  Patient also notes that he has some early satiety.  He states that he saw his primary care doctor and outpatient cardiologist.  He had a recent TTE which showed worsening of his prosthetic aortic valve and a Lexiscan  Myoview  with no evidence of significant ischemia.  Patient also increased his Lasix  to 20 mg twice daily with no improvement in his symptoms.   He discussed his worsening symptoms with his primary care physician who recommended that he present to the hospital for further evaluation.   Of note, patient does state that he has had some dietary indiscretion over the last week or so noting that he has had increased sodium intake.   ED course:  Normotensive, O2 sats 91% on room air. Troponin T 99, proBNP ~1300. EKG inverted T waves in Leads I II, x-ray with  bilateral pleural effusions and interstitial pulmonary edema. Cta PE  w/p evidence of PE. He was given 1x dose of IV lasix  40mg .   Assessment and Plan: Acute on chronic HF exacerbation Acute HF Likely due to dietary indiscretion.  Presented with orthopnea and DOE as well as BLE edema. BNP was elevated.  TTE 11/2023 LVEF 50 to 55%, Unable to evaluate for RWMA,  G2DD, RV sys fxn nl, Mild to mod MR, prostehetic valve present mean gradient 32 from 23 (07/2021).  Patient was discharged with IV lasix . He noted significant improvement in his symptoms, stating his orthopnea resolved, and he was able to walk further without dyspnea. He did continue to have BLE edema. He was noted to have oxygenation in the 80s after ambulation. ABG showed O2 sat of 89%. A REDs was obtained at 36%. BNP remained elevated but given his improvement patient was discharged with instructions for close follow up with HF team. Patient was discharged with PO lasix  40mg  daily. Attempted to d/c on SGLTi but this was unaffordable. He was also discharged on oxygen.   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   HLD HTN Chronic, conintue home medications   H/O DVT/PE  Not on [facility] currently.   OSA not on CPAP Needs sleep study  Class I obesity  BMI 33.51 kg/m2 noted  Complicates care.          Consultants: None Procedures performed: None  Disposition: Home Diet recommendation:  Discharge Diet Orders (From admission, onward)     Start     Ordered   01/22/24 0000  Diet - low sodium heart healthy        01/22/24 1057           Cardiac diet DISCHARGE MEDICATION: Allergies as of 01/22/2024       Reactions   Atorvastatin Other (See Comments)   aches myalgia myalgia    aches  myalgia   Ramipril Other (See Comments), Cough   cough        Medication List     TAKE these  medications    amLODipine  10 MG tablet Commonly known as: NORVASC  Take 1 tablet by mouth once daily   aspirin  81 MG chewable tablet Chew 81 mg by mouth daily.   cetirizine 10 MG tablet Commonly known as: ZYRTEC Take 10 mg by mouth daily.   docusate sodium  250 MG capsule Commonly known as: COLACE Take 250 mg by mouth daily as needed for constipation.   empagliflozin  10 MG Tabs tablet Commonly known as: Jardiance  Take 1 tablet (10 mg total) by mouth daily.   fluticasone 50 MCG/ACT nasal spray Commonly known as: FLONASE Place 2 sprays into both nostrils daily as needed for allergies or rhinitis.   furosemide  20 MG tablet Commonly known as: LASIX  Take 40 MG ( 2 tablet) daily until you see your HF team. What changed: additional instructions   metoprolol  succinate 25 MG 24 hr tablet Commonly known as: TOPROL -XL Take 1 tablet (25 mg total) by mouth daily. Take with or immediately following a meal.   montelukast  10 MG tablet Commonly known as: SINGULAIR  Take 1 tablet (10 mg total) by mouth at bedtime as needed (allergies).   multivitamin tablet Take 1 tablet by mouth daily.   omeprazole 20 MG capsule Commonly known as: PRILOSEC Take 20 mg by mouth daily. As needed   ondansetron  4 MG disintegrating tablet Commonly known as: ZOFRAN -ODT Take 1 tablet (4 mg total) by mouth every 8 (eight) hours as needed for nausea or vomiting.   potassium chloride  10 MEQ tablet Commonly known as: KLOR-CON  Take 1 tablet (10 mEq total) by mouth daily. Take with lasix    PROBIOTIC ACIDOPHILUS PO Take by mouth daily.   rosuvastatin  20 MG tablet Commonly known as: CRESTOR  Take 1 tablet (20 mg total) by mouth daily.   tamsulosin  0.4 MG Caps capsule Commonly known as: FLOMAX  Take 1 capsule by mouth once daily        Follow-up Information     [facility]. Go on 01/27/2024.   Specialty: Cardiology Why: Hospital Follow-Up 01/27/2024 @ 8:30  AM Please bring all medications to follow-up appointment [facility] Second Floor Free Valet Parking at the door Contact information: [address] [place]  [address] [phone]        [name], MD. Schedule an appointment as soon as possible for a visit in 1 week(s).   Specialty: Family Medicine Contact information: [address] [place] [address] [phone]                Discharge Exam: Fredricka Weights   01/19/24 0847 01/22/24 0503  Weight: 94.3 kg 94.2 kg   Physical Exam  Constitutional: In no distress.  Cardiovascular: Normal rate, regular rhythm. Systolic murmur.  bilateral lower extremity edema  Pulmonary: Non labored breathing on room air, no wheezing or rales.   Abdominal: Soft. Non distended and non tender Musculoskeletal: Normal range of motion.     Neurological: Alert and oriented to person, place, and time. Non focal  Skin: Skin is warm and dry.    Condition at discharge: improving  The results of significant diagnostics from this hospitalization (including imaging, microbiology, ancillary and laboratory) are listed below for reference.   Imaging Studies: US  Venous Img Lower Bilateral (DVT) Result Date: 01/20/2024 CLINICAL DATA:  83 year old male with lower extremity pain. EXAM: BILATERAL LOWER EXTREMITY VENOUS DOPPLER ULTRASOUND TECHNIQUE: Gray-scale sonography with graded compression, as well as color Doppler and duplex ultrasound were performed to evaluate the lower extremity deep venous systems from the level of the common femoral vein and including the common femoral, femoral, profunda femoral, popliteal and calf veins including the posterior tibial, peroneal and gastrocnemius veins when visible. The superficial great saphenous vein was also interrogated. Spectral Doppler was utilized to evaluate flow at rest and with distal augmentation maneuvers in the common  femoral, femoral and popliteal veins. COMPARISON:  None Available. FINDINGS: RIGHT LOWER EXTREMITY Common Femoral Vein: No evidence of thrombus. Normal compressibility, respiratory phasicity and response to augmentation. Saphenofemoral Junction: No evidence of thrombus. Normal compressibility and flow on color Doppler imaging. Profunda Femoral Vein: No evidence of thrombus. Normal compressibility and flow on color Doppler imaging. Femoral Vein: No evidence of thrombus. Normal compressibility, respiratory phasicity and response to augmentation. Popliteal Vein: No evidence of thrombus. Normal compressibility, respiratory phasicity and response to augmentation. Calf Veins: No evidence of thrombus. Normal compressibility and flow on color Doppler imaging. Other Findings:  None. LEFT LOWER EXTREMITY Common Femoral Vein: No evidence of thrombus. Normal compressibility, respiratory phasicity and response to augmentation. Saphenofemoral Junction: No evidence of thrombus. Normal compressibility and flow on color Doppler imaging. Profunda Femoral Vein: No evidence of thrombus. Normal compressibility and flow on color Doppler imaging. Femoral Vein: No evidence of thrombus. Normal compressibility, respiratory phasicity and response to augmentation. Popliteal Vein: No evidence of thrombus. Normal compressibility, respiratory phasicity and response to augmentation. Calf Veins: No evidence of thrombus. Normal compressibility and flow on color Doppler imaging. Other Findings:  None. IMPRESSION: No evidence of bilateral lower extremity deep venous thrombosis. Ester Sides, MD [facility] Radiology Electronically Signed   By: Ester Sides M.D.   On: 01/20/2024 13:01   DG Chest 2 View Result Date: 01/19/2024 CLINICAL DATA:  Shortness of breath. EXAM: CHEST - 2 VIEW COMPARISON:  CTA chest dated 01/18/2024. FINDINGS: The heart size and mediastinal contours are within normal limits.  Aortic valve replacement. Median sternotomy. Cardiomegaly with similar bilateral pleural effusions and interstitial pulmonary edema. No pneumothorax. No acute osseous abnormality. IMPRESSION: Cardiomegaly with bilateral pleural effusions and interstitial pulmonary edema, similar to the prior exam. Electronically Signed   By: Harrietta Sherry M.D.   On: 01/19/2024 09:52   CT Angio Chest Pulmonary Embolism (PE) W or WO Contrast Result Date: 01/18/2024 CLINICAL DATA:  Shortness of breath history of DVT EXAM: CT ANGIOGRAPHY CHEST WITH CONTRAST TECHNIQUE: Multidetector CT imaging of the chest was performed using the standard protocol during bolus administration of intravenous contrast. Multiplanar CT image reconstructions and MIPs were obtained to evaluate the vascular anatomy. RADIATION DOSE REDUCTION: This exam was performed according to the departmental dose-optimization program which includes automated exposure control, adjustment of the mA and/or kV according to patient size and/or use of iterative  reconstruction technique. CONTRAST:  ISOVUE -370 IOPAMIDOL  (ISOVUE -370) INJECTION 76% COMPARISON:  CT 03/30/2023 FINDINGS: Cardiovascular: Satisfactory opacification of the pulmonary arteries to the segmental level. No evidence of pulmonary embolism. Moderate aortic atherosclerosis. No aneurysm. Multi vessel coronary vascular calcification. Aortic valve replacement. Cardiomegaly. No sizable pericardial effusion. Reflux of contrast into the hepatic veins consistent with elevated right heart pressure. Prominent pulmonary trunk measuring 3.7 cm with slightly enlarged main pulmonary arteries. Mediastinum/Nodes: Patent trachea. No suspicious thyroid  mass. Multiple subcentimeter mediastinal lymph nodes. Borderline low right paratracheal nodes measuring up to 9 mm. Esophagus within normal limits Lungs/Pleura: Small bilateral pleural effusions do. Mild dependent atelectasis in the lower lobes. Mild atelectasis at the  lingula and right middle lobe. Upper Abdomen: Pneumobilia as before presumably due to sphincterotomy. No acute finding. Chronic calcification at the periphery of the spleen. Musculoskeletal: Sternotomy.  No acute osseous abnormality. Review of the MIP images confirms the above findings. IMPRESSION: 1. Negative for acute pulmonary embolus. 2. Cardiomegaly with small bilateral pleural effusions and mild dependent atelectasis. 3. Prominent pulmonary trunk and main pulmonary arteries suggesting pulmonary arterial hypertension. Reflux of contrast into the hepatic veins as may be seen with elevated right heart pressure 4. Aortic atherosclerosis. Aortic Atherosclerosis (ICD10-I70.0). Electronically Signed   By: Luke Bun M.D.   On: 01/18/2024 16:31   NM Myocar Multi W/Spect W/Wall Motion / EF Result Date: 01/05/2024   No ST deviation was noted. Pharmacological myocardial perfusion imaging study with no significant  ischemia Small region fixed defect distal inferolateral wall, unable to exclude prior MI No focal wall motion abnormality, EF estimated at 30% (poor estimation in the setting of GI and attenuation artifact) No EKG changes concerning for ischemia at peak stress or in recovery. Low risk scan Recent echo with low normal ejection fraction 50 to 55% Signed, Velinda Lunger, MD, Ph.D [facility]    Microbiology: Results for orders placed or performed in visit on 10/14/21  CULTURE, URINE COMPREHENSIVE     Status: None   Collection Time: 10/14/21 10:13 AM   Specimen: Urine   Urine  Result Value Ref Range Status   Urine Culture, Comprehensive Final report  Final   Organism ID, Bacteria Comment  Final    Comment: No growth in 36 - 48 hours.    Labs: CBC: Recent Labs  Lab 01/19/24 0848 01/20/24 0453 01/22/24 0350  WBC 7.6 6.7 7.0  HGB 13.8 12.9* 13.0  HCT 40.7 37.4* 38.8*  MCV 94.7 94.4 95.8  PLT 171 145* 152   Basic Metabolic Panel: Recent Labs  Lab 01/19/24 0848 01/19/24 1234  01/20/24 0453 01/21/24 0749 01/22/24 0350  NA 140  --  141 142 140  K 4.6  --  3.6 3.4* 4.7  CL 104  --  104 106 103  CO2 28  --  26 27 31   GLUCOSE 108*  --  89 88 101*  BUN 20  --  22 21 24*  CREATININE 0.85  --  0.75 0.77 0.87  CALCIUM  9.9  --  9.1 8.6* 9.5  MG  --  2.0  --  1.9 2.2   Liver Function Tests: No results for input(s): AST, ALT, ALKPHOS, BILITOT, PROT, ALBUMIN in the last 168 hours. CBG: No results for input(s): GLUCAP in the last 168 hours.  Discharge time spent: greater than 30 minutes.  Signed: Alban Pepper, MD [facility] 01/24/2024

## 2024-01-24 NOTE — Progress Notes (Signed)
 Heart Failure Nurse Navigator Progress Note  PCP: [name], MD PCP-Cardiologist: Evalene Lunger, MD Admission Diagnosis: Acute exacerbation of CHF (congestive heart failure) (HCC)  Admitted from: Home  Follow-up to patient discharge from [facility] 01/22/24: -Called patient remotely on 01/24/24 @ 1500 because  Navigator had tried to see him on 01/21/24 but patient was OTC at that time. He was then discharged on 01/22/24 from [facility]. CHF Clinic TOC had already been scheduled for him prior to discharge for 01/27/24 @ 8:30 AM.  When speaking with patient and wife on the phone he shared he was still having symptoms of SOB and swelling, same as it was at discharge.  Patient and wife had  called [facility] cardiology and scheduled a follow-up appointment for tomorrow 01/25/24 @ 2:20 PM.  Followed by the previously scheduled CHF TOC appointment on 01/27/24 @ 8:30 AM.  They were provided with CHF education and instructed to call EMS with worsening Heart Failure symptoms if needed prior to either of those follow-up appointments.   Presentation:   Jacob Montes 83 y.o. male who presented to the ED on 01/19/24 with shortness of breath ongoing for weeks.  Per note had seen cardiology and had lasix  doubled with no improved symptoms.  Unable to lay flat at night or walk any distance without labored breathing.  Swelling in abdomen and legs. Past medical history of Triple Bypass & TAVR. Denied any chest pain, cough, congestion, dizziness or lightheadedness. Pro-BNP- 1,383. HS-Troponin 99.  ECHO/ LVEF: Last ECHO 50-55% Grade II Diastolic Dysfunction   Clinical Course:  Past Medical History:  Diagnosis Date   Actinic keratosis    Acute exacerbation of CHF (congestive heart failure) (HCC) 01/19/2024   Aortic stenosis    a. s/p TAVR 03/2017   CAD (coronary artery disease)    a. s/p 3-v CABG in 07/2007   Carotid artery disease    a. 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   GERD (gastroesophageal reflux disease)    HLD (hyperlipidemia)    Hyperlipidemia 10/15/2015   Hypertension    Nephrolithiasis 11/16/2018   Pulmonary embolism (HCC) 08/2010   Skin cancer    Sleep apnea    Stroke [facility])      Social History   Socioeconomic History   Marital status: Married    Spouse name: Not on file   Number of children: 2   Years of education: Not on file   Highest education level: Some college, no degree  Occupational History   Occupation: retired  Tobacco Use   Smoking status: Former    Current packs/day: 0.00    Types: Cigarettes    Quit date: 1979    Years since quitting: 46.9   Smokeless tobacco: Never   Tobacco comments:    Quit in 1979  Vaping Use   Vaping status: Never Used  Substance and Sexual Activity   Alcohol use: Never   Drug use: Never   Sexual activity: Yes    Partners: Female  Other Topics Concern   Not on file  Social History Narrative   Not on file   Social Drivers of Health   Tobacco Use: Medium Risk (01/19/2024)   Patient History    Smoking Tobacco Use: Former    Smokeless Tobacco Use: Never    Passive Exposure: Not on file  Financial Resource Strain: Low Risk  (12/02/2023)   Received  from [facility]   Overall Financial Resource Strain (CARDIA)    Difficulty of Paying Living Expenses: Not hard at all  Food Insecurity: No Food Insecurity (01/21/2024)   Epic    Worried About Running Out of Food in the Last Year: Never true    Ran Out of Food in the Last Year: Never true  Transportation Needs: No Transportation Needs (01/21/2024)   Epic    Lack of Transportation (Medical): No    Lack of Transportation (Non-Medical): No  Physical Activity: Inactive (12/02/2023)   Received from [facility]   Exercise Vital Sign    On average, how many days per week do you engage in moderate to strenuous exercise (like a brisk walk)?:  0 days    On average, how many minutes do you engage in exercise at this level?: 0 min  Stress: No Stress Concern Present (11/05/2023)   Received from [facility] of Occupational Health - Occupational Stress Questionnaire    Feeling of Stress : Not at all  Social Connections: Socially Integrated (01/21/2024)   Social Connection and Isolation Panel    Frequency of Communication with Friends and Family: More than three times a week    Frequency of Social Gatherings with Friends and Family: More than three times a week    Attends Religious Services: More than 4 times per year    Active Member of Clubs or Organizations: Yes    Attends Banker Meetings: More than 4 times per year    Marital Status: Married  Depression (PHQ2-9): Low Risk (10/23/2021)   Depression (PHQ2-9)    PHQ-2 Score: 0  Alcohol Screen: Low Risk (07/03/2021)   Alcohol Screen    Last Alcohol Screening Score (AUDIT): 0  Housing: Low Risk (01/21/2024)   Epic    Unable to Pay for Housing in the Last Year: No    Number of Times Moved in the Last Year: 0    Homeless in the Last Year: No  Utilities: Not At Risk (01/21/2024)   Epic    Threatened with loss of utilities: No  Health Literacy: Adequate Health Literacy (11/05/2023)   Received from [facility]   [phone] Health Literacy    How often do you need to have someone help you when you read instructions, pamphlets, or other written material from your doctor or pharmacy?: Never   Education Assessment and Provision:  Detailed education and instructions provided on heart failure disease management including the following:  Signs and symptoms of Heart Failure When to call the physician Importance of daily weights Low sodium diet Fluid restriction Medication management Anticipated future follow-up appointments  Patient education given on each of the above topics.  Patient acknowledges understanding via  teach back method and acceptance of all instructions.  Education Materials:  Living Better With Heart Failure Booklet, HF zone tool, & Daily Weight Tracker Tool.  Patient has scale at home: Yes Patient has pill box at home: Yes    High Risk Criteria for Readmission and/or Poor Patient Outcomes: Heart failure hospital admissions (last 6 months): 1  No Show rate: 1% Difficult social situation: none determined. Demonstrates medication adherence: Yes Primary Language: English Literacy level: Reading, Writing & Comprehension  Barriers of Care:   Daily Weights Diet & Fluid Restrictions  Considerations/Referrals:  Referral made to Heart Failure Pharmacist Stewardship: No.  Patient already discharged. Referral made to Heart Failure CSW/NCM TOC: No Referral made to  Heart & Vascular TOC clinic: Yes. New CHF TOC @ [facility] 01/27/2024 @ 8:30 AM.  Items for Follow-up on DC/TOC: Daily Weights Diet & Fluid Restrictions Continued Heart Failure Education  Charmaine Pines, RN, BSN [facility] Heart Failure Navigator Secure Chat Only

## 2024-01-24 NOTE — Telephone Encounter (Signed)
 Pts wife calling to

## 2024-01-25 ENCOUNTER — Ambulatory Visit: Admitting: Medical

## 2024-01-25 ENCOUNTER — Encounter: Payer: Self-pay | Admitting: Medical

## 2024-01-25 VITALS — BP 107/56 | HR 77 | Ht 66.0 in | Wt 213.4 lb

## 2024-01-25 DIAGNOSIS — Z952 Presence of prosthetic heart valve: Secondary | ICD-10-CM

## 2024-01-25 DIAGNOSIS — Z79899 Other long term (current) drug therapy: Secondary | ICD-10-CM

## 2024-01-25 MED ORDER — FUROSEMIDE 40 MG PO TABS: 60.0000 mg | ORAL_TABLET | Freq: Every day | ORAL | 3 refills | Status: DC

## 2024-01-25 MED ORDER — POTASSIUM CHLORIDE ER 20 MEQ PO TBCR: 20.0000 meq | EXTENDED_RELEASE_TABLET | Freq: Every day | ORAL | 3 refills | Status: DC

## 2024-01-25 NOTE — Progress Notes (Unsigned)
 Cardiology Office Note   Date:  01/27/2024  ID:  Jacob Montes, DOB [birth year], MRN [id number] PCP: [name], MD  [facility] Providers Cardiologist:  None     History of Present Illness Jacob Montes is a 83 y.o. male CAD s/p CABG x3 June 2009, AVS status post TAVR January 19, 2017, PAD, carotid stenosis with left carotid endarterectomy in 2009, DVT/PE in 2012, prior smoker, sleep apnea not on CPAP who presents for follow-up of CAD and TAVR.  Echocardiogram in October 2025 showed EF of 50%, TAVR valve with mean gradient 32 mmHg (23 up to 32 over 2 years), mild to mod MR  He was seen 01/03/24 reporting SOB. It was noted on echo worsening prosthetic aortic valve with mean gradient . Recommended lasix  40mg  alternating with 20mg  daily. May need TEE if symptoms persist.  He was recently admitted for acute CHF. He increased lasix  to twice daily with no improvement. Pro-BNP 1300. HS trop 99. CXR showed b/l pleural effusions and interstitial pulmonary edema. He was diuresed with IV lasix . Pro-BNP remained elevated, but he was discharged home. Unable to afford SGLT2i at d/c.   Today, the patient reports he is not feeling any better since discharge. He feels sweling and breathing has not improved. He is having orthopnea at night and having to sue pillows and 2L O2 to breath. He denies chest pain. He is taking lasix  40mg  daily. He is mildly SOB at rest even.    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   Studies Reviewed      Myoview  lexiscan  12/2023  Pharmacological myocardial perfusion imaging study with no significant  ischemia Small region fixed defect distal inferolateral wall, unable to exclude prior MI No focal wall motion abnormality, EF estimated at 30% (poor estimation in the setting of GI and attenuation artifact) No EKG changes concerning for ischemia at peak stress or in recovery. Low risk scan Recent echo with low normal ejection  fraction 50 to 55%   Echo 11/2023   1. Technically difficult study.   2. Left ventricular ejection fraction, by estimation, is 50 to 55%. The  left ventricle has low normal function. Left ventricular endocardial  border not optimally defined to evaluate regional wall motion. The left  ventricular internal cavity size was  mildly dilated. Left ventricular diastolic parameters are consistent with  Grade II diastolic dysfunction (pseudonormalization). The average left  ventricular global longitudinal strain is -14.8 %. The global longitudinal  strain is abnormal.   3. Right ventricular systolic function is normal. The right ventricular  size is normal.   4. The mitral valve is abnormal. Mild to moderate mitral valve  regurgitation. Mild calcific mitral stenosis. Severe mitral annular  calcification.   5. TAVR present. Moderate stenosis. Vmax 3.64 m/s, DVI 0.26, MG 32 mmHg  with AVA 0.84. Mild perivalvular leak of the aortic prosthesis.   Comparison(s): A prior study was performed on 07/17/2021. TAVR Vmax  previously 3.32 m/s with MG , AVA 0.99, and DVI 0.34. Velocities and  gradients measure higher on today's exam.   Echo 07/2021 1. No valve vegetation, though challenging images   2. Left ventricular ejection fraction, by estimation, is 60 to 65%. The  left ventricle has normal function. The left ventricle has no regional  wall motion abnormalities. Left ventricular diastolic parameters are  consistent with Grade II diastolic  dysfunction (pseudonormalization).   3. Right ventricular systolic function is normal. The right ventricular  size is normal. Tricuspid regurgitation signal is inadequate for assessing  PA pressure.   4. Left atrial size was moderately dilated.   5. The mitral valve is normal in structure. Mild to moderate mitral valve  regurgitation. No evidence of mitral stenosis.   6. The aortic valve was not well visualized. Aortic valve regurgitation  is not  visualized. Moderate aortic valve stenosis. Aortic valve area, by  VTI measures 0.99 cm. Aortic valve mean gradient measures 23.0 mmHg.  Aortic valve Vmax measures 3.32 m/s.   7. The inferior vena cava is normal in size with greater than 50%  respiratory variability, suggesting right atrial pressure of 3 mmHg.   Echo 2022 1. Left ventricular ejection fraction, by estimation, is 60 to 65%. The  left ventricle has normal function. The left ventricle has no regional  wall motion abnormalities. The left ventricular internal cavity size was  mildly dilated. There is mild left  ventricular hypertrophy. Left ventricular diastolic parameters are  consistent with Grade II diastolic dysfunction (pseudonormalization).   2. Right ventricular systolic function is normal. The right ventricular  size is normal. There is normal pulmonary artery systolic pressure. The  estimated right ventricular systolic pressure is 35.5 mmHg.   3. Left atrial size was moderately dilated.   4. The mitral valve is normal in structure. Mild mitral valve  regurgitation. No evidence of mitral stenosis.   5. The aortic valve has been repaired/replaced. Aortic valve  regurgitation is mild. No aortic stenosis is present. There is a Sapien  prosthetic (TAVR) valve present in the aortic position. Procedure Date:  08/2016. Aortic valve area, by VTI measures 1.27  cm. Aortic valve mean gradient measures 23.3 mmHg. Aortic valve Vmax  measures 3.17 m/s.   6. The inferior vena cava is normal in size with greater than 50%  respiratory variability, suggesting right atrial pressure of 3 mmHg.   Echo TEE 11/2018  1. Left ventricular ejection fraction, by visual estimation, is 60 to  65%. The left ventricle has normal function. Normal left ventricular size.  There is mildly increased left ventricular hypertrophy.   2. Left ventricular diastolic function could not be evaluated pattern of  LV diastolic filling.   3. Global right  ventricle has normal systolic function.The right  ventricular size is normal. No increase in right ventricular wall  thickness.   4. Left atrial size was normal.   5. Right atrial size was normal.   6. The mitral valve is grossly normal. No evidence of mitral valve  regurgitation. Mild mitral stenosis.   7. The tricuspid valve is normal in structure. Tricuspid valve  regurgitation is trivial.   8. Aortic valve regurgitation is trivial by color flow Doppler. Normal  functioning TAVR prosthesis with trivial perivalvular regurgitation.   9. Structurally normal aortic valve, with no evidence of sclerosis or  stenosis.  10. The pulmonic valve was normal in structure. Pulmonic valve  regurgitation is not visualized by color flow Doppler.  11. TR signal is inadequate for assessing pulmonary artery systolic  pressure.  12. No vegetations to suggest endocarditis.   Echo 11/2018 1. Left ventricular ejection fraction, by visual estimation, is 60 to  65%. The left ventricle has normal function. Normal left ventricular size.  There is mildly increased left ventricular hypertrophy.   2. Left ventricular diastolic Doppler parameters are consistent with  pseudonormalization pattern of LV diastolic filling.   3. Global right ventricle has normal systolic function.The right  ventricular size is normal. No increase  in right ventricular wall  thickness.   4. Left atrial size was moderately dilated.   5. The aortic valve was not well visualized, Bioprosthetic TAVR   6. Aortic valve area, by VTI measures 1.29 cm.   7. Aortic valve mean gradient measures 16.3 mmHg.   8. Normal pulmonary artery systolic pressure.   9. Definity  contrast agent was given IV to delineate the left ventricular  endocardial borders.       Physical Exam VS:  BP (!) 107/56 (BP Location: Left Arm, Patient Position: Sitting, Cuff Size: Normal)   Pulse 77   Ht 5' 6 (1.676 m)   Wt 213 lb 6.4 oz (96.8 kg)   SpO2 90%   BMI  34.44 kg/m        Wt Readings from Last 3 Encounters:  01/27/24 214 lb 12.8 oz (97.4 kg)  01/25/24 213 lb 6.4 oz (96.8 kg)  01/22/24 207 lb 9.6 oz (94.2 kg)    GEN: Well nourished, well developed in no acute distress NECK: + JVD; No carotid bruits CARDIAC: RRR, + murmur, no rubs, gallops RESPIRATORY:  Clear to auscultation without rales, wheezing or rhonchi  ABDOMEN: Soft, non-tender, non-distended EXTREMITIES: 1+ lower leg edema; No deformity   ASSESSMENT AND PLAN  Acute diastolic heart failure Recent admission for acute heart failure treated with IV lasix  and sent home on lasix  40mg  daily. The patient does not feel much better, he feels swelling and breathing is getting worse. He is having orthopnea at night and using 2L O2. I will increase lasix  to 40mg  BID x 3 days, then down to lasix  60mg  daily. BMET in 2 weeks. Most recent echo showed normal pump function and worsening TAVR valve gradient 56mmHg>32mmHg. He had recent MPI for SOB that showed LVEF 30% and no ischemia. unsure if valve is contributing to acute CHF. 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 He has apt with heart failure team later this week.   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   CAD s/p CABG 2009 Recent MPI was low risk with no significant ischemia. No chest pain reported. Continue ASA, Toprol , and Crestor .   HLD LDL 70. Continue Crestor  20mg  daily.     Dispo: follow-up in 1 month  Signed, Zahlia Deshazer VEAR Fishman, PA-C

## 2024-01-25 NOTE — Patient Instructions (Signed)
 Medication Instructions:  Your physician recommends the following medication changes.  INCREASE: Lasix  to 40 mg by mouth twice a day for 3 days, then decrease to 60 mg by mouth daily. Potassium 20 meq by mouth daily   *If you need a refill on your cardiac medications before your next appointment, please call your pharmacy*  Lab Work: Your provider would like for you to return in 2 weeks to have the following labs drawn: BMP.   Please go to [facility] [address] ([facility] [address] [facility], [place] [address] You do not need an appointment.  They are open from 8 am- 4:30 pm.  Lunch from 1:00 pm- 2:00 pm You will not need to be fasting.    Testing/Procedures: Your physician has requested that you have an Limited echocardiogram. Echocardiography is a painless test that uses sound waves to create images of your heart. It provides your doctor with information about the size and shape of your heart and how well your hearts chambers and valves are working.   You may receive an ultrasound enhancing agent through an IV if needed to better visualize your heart during the echo. This procedure takes approximately one hour.  There are no restrictions for this procedure.  This will take place at [address] [facility]) [address], [place] [address]  Please note: We ask at that you not bring children with you during ultrasound (echo/ vascular) testing. Due to room size and safety concerns, children are not allowed in the ultrasound rooms during exams. Our front office staff cannot provide observation of children in our lobby area while testing is being conducted. An adult accompanying a patient to their appointment will only be allowed in the ultrasound room at the discretion of the ultrasound technician under special circumstances. We apologize for any inconvenience.   Follow-Up: At [facility], you and your health needs are our priority.  As part of our  continuing mission to provide you with exceptional heart care, our providers are all part of one team.  This team includes your primary Cardiologist (physician) and Advanced Practice Providers or APPs (Physician Assistants and Nurse Practitioners) who all work together to provide you with the care you need, when you need it.  Your next appointment:   1 month(s)  Provider:   Mikey Fishman, PA-C

## 2024-01-26 ENCOUNTER — Telehealth: Payer: Self-pay | Admitting: Family

## 2024-01-26 NOTE — Telephone Encounter (Signed)
 Called to confirm/remind patient of their appointment at the [facility] on 01/27/24.   Appointment:   [x] Confirmed  [] Left mess   [] No answer/No voice mail  [] VM Full/unable to leave message  [] Phone not in service  Patient reminded to bring all medications and/or complete list.  Confirmed patient has transportation. Gave directions, instructed to utilize valet parking.

## 2024-01-26 NOTE — Progress Notes (Unsigned)
 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  Echo 11/11/23: EF 50%, TAVR valve with mean gradient 32 mmHg, previously was 23  Admitted 01/19/24 with 2 weeks of shortness of breath, feeling bloated, and worsening BLE edema for ~ 2 weeks. Had increased lasix  at home but without improvement of symptoms. In the ED, normotensive, O2 sats 91% on room air. Troponin T 99, proBNP ~1300. EKG inverted T waves in Leads I II, x-ray with bilateral pleural effusions and interstitial pulmonary edema. Cta PE w/p evidence of PE. IV diuresed. ReDs reading elevated at 36%. SGLT2 was unaffordable.    He presents today, with his wife, for his initial TOC HF visit with a chief complaint of moderate shortness of breath. Has associated fatigue, right sided chest pain when taking a deep breath, bilateral pedal edema with L>R. Has been sleeping in recliner due to SOB/ weakness. He denies palpitations, dizziness. Has a decreased appetite. Has been taking lasix  80mg  daily for the past 2 days and after he takes 80mg  today, he is to decrease it down to 60mg  daily. Says that his edema is a little better but not as improved as he thought it would be. Says that cardiology was concerned that his valve may be worsening causing his worsening symptoms. Has upcoming echo scheduled for 03/02/24.   Has not started jardiance  because it will be ~$300 / month and they can't afford that.   Review of Systems: [y] = yes, [ ]  = no   General: Weight gain [ ] ; Weight loss [ ] ; Anorexia [ ] ; Fatigue davis.dad ]; Fever [ ] ; Chills [ ] ; Weakness davis.dad ]  Cardiac:  Chest pain/pressure davis.dad ]; Resting SOB [ ] ; Exertional SOB davis.dad ]; Orthopnea [ ] ; Pedal Edema davis.dad ]; Palpitations [ ] ; Syncope [ ] ; Presyncope [ ] ; Paroxysmal nocturnal dyspnea[ ]   Pulmonary: Cough davis.dad ]; Wheezing[ ] ; Hemoptysis[ ] ; Sputum [ ] ; Snoring [ ]   GI: Vomiting[ ] ; Dysphagia[ ] ; Melena[ ] ; Hematochezia [ ] ; Heartburn[ ] ; Abdominal pain [ ] ; Constipation [ ] ; Diarrhea [ ] ; BRBPR [ ]   GU: Hematuria[ ] ; Dysuria [ ] ; Nocturia[ ]   Vascular: Pain in legs with walking [ ] ; Pain in feet with lying flat [ ] ; Non-healing sores [ ] ; Stroke [ ] ; TIA [ ] ; Slurred speech [ ] ;  Neuro: Headaches[ ] ; Vertigo[ ] ; Seizures[ ] ; Paresthesias[ ] ;Blurred vision [ ] ; Diplopia [ ] ; Vision changes [ ]   Ortho/Skin: Arthritis [ ] ; Joint pain [ ] ; Muscle pain [ ] ; Joint swelling [ ] ; Back Pain [ ] ; Rash [ ]   Psych: Depression[ ] ; Anxiety[ ]   Heme: Bleeding problems [ ] ; Clotting disorders [ ] ; Anemia [ ]   Endocrine: Diabetes [ ] ; Thyroid  dysfunction[ ]    Past Medical History:  Diagnosis Date   Actinic keratosis    Acute exacerbation of CHF (congestive heart failure) (HCC) 01/19/2024   Aortic stenosis    a. s/p TAVR 03/2017   CAD (coronary artery disease)    a. s/p 3-v CABG in 07/2007   Carotid artery disease    a. 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     cetirizine (ZYRTEC) 10 MG tablet Take 10 mg by mouth daily.     docusate sodium  (COLACE) 250 MG capsule Take 250 mg by mouth  daily as needed for constipation.      empagliflozin  (JARDIANCE ) 10 MG TABS tablet Take 1 tablet (10 mg total) by mouth daily. 30 tablet 0   fluticasone (FLONASE) 50 MCG/ACT nasal spray Place 2 sprays into both nostrils daily as needed for allergies or rhinitis.     furosemide  (LASIX ) 40 MG tablet Take 1.5 tablets (60 mg total) by mouth daily. 135 tablet 3   Lactobacillus (PROBIOTIC ACIDOPHILUS PO) Take by mouth daily.     metoprolol  succinate (TOPROL -XL) 25 MG 24 hr tablet Take 1 tablet (25 mg total) by mouth daily. Take with or immediately following a meal. 90 tablet 3   montelukast  (SINGULAIR ) 10 MG tablet Take 1 tablet (10 mg total) by mouth at bedtime as needed (allergies). 30 tablet 3   Multiple Vitamin (MULTIVITAMIN) tablet Take 1 tablet by mouth daily.     omeprazole (PRILOSEC) 20 MG capsule Take 20 mg by mouth daily. As needed     ondansetron  (ZOFRAN -ODT) 4 MG disintegrating tablet Take 1 tablet (4 mg total) by mouth every 8 (eight) hours as needed for nausea or vomiting. 20 tablet 0   potassium chloride  20 MEQ TBCR Take 1 tablet (20 mEq total) by mouth daily. Take with lasix  90 tablet 3   rosuvastatin  (CRESTOR ) 20 MG tablet Take 1 tablet (20 mg total) by mouth daily. 90 tablet 0   tamsulosin  (FLOMAX ) 0.4 MG CAPS capsule Take 1 capsule by mouth once daily (Patient not taking: Reported on 01/25/2024) 90 capsule 0   No current facility-administered medications for this visit.    Allergies[1]    Social History   Socioeconomic History   Marital status: Married    Spouse name: Not on file   Number of children: 2   Years of education: Not on file   Highest education level: Some college, no degree  Occupational History   Occupation: retired  Tobacco Use   Smoking status: Former    Current packs/day: 0.00    Types: Cigarettes    Quit date: 1979    Years since quitting: 46.9   Smokeless tobacco: Never   Tobacco comments:    Quit in 1979  Vaping Use   Vaping status: Never Used   Substance and Sexual Activity   Alcohol use: Never   Drug use: Never   Sexual activity: Yes    Partners: Female  Other Topics Concern   Not on file  Social History Narrative   Not on file   Social Drivers of Health   Tobacco Use: Medium Risk (01/25/2024)   Patient History    Smoking Tobacco Use: Former    Smokeless Tobacco Use: Never    Passive Exposure: Not  on file  Financial Resource Strain: Low Risk  (12/02/2023)   Received from [facility]   Overall Financial Resource Strain (CARDIA)    Difficulty of Paying Living Expenses: Not hard at all  Food Insecurity: No Food Insecurity (01/21/2024)   Epic    Worried About Running Out of Food in the Last Year: Never true    Ran Out of Food in the Last Year: Never true  Transportation Needs: No Transportation Needs (01/21/2024)   Epic    Lack of Transportation (Medical): No    Lack of Transportation (Non-Medical): No  Physical Activity: Inactive (12/02/2023)   Received from [facility]   Exercise Vital Sign    On average, how many days per week do you engage in moderate to strenuous exercise (like a brisk walk)?: 0 days    On average, how many minutes do you engage in exercise at this level?: 0 min  Stress: No Stress Concern Present (11/05/2023)   Received from [facility] - Occupational Stress Questionnaire    Feeling of Stress : Not at all  Social Connections: Socially Integrated (01/21/2024)   Social Connection and Isolation Panel    Frequency of Communication with Friends and Family: More than three times a week    Frequency of Social Gatherings with Friends and Family: More than three times a week    Attends Religious Services: More than 4 times per year    Active Member of Clubs or Organizations: Yes    Attends Banker Meetings: More than 4 times per year    Marital Status: Married  Catering Manager Violence: Not  At Risk (01/21/2024)   Epic    Fear of Current or Ex-Partner: No    Emotionally Abused: No    Physically Abused: No    Sexually Abused: No  Depression (PHQ2-9): Low Risk (10/23/2021)   Depression (PHQ2-9)    PHQ-2 Score: 0  Alcohol Screen: Low Risk (07/03/2021)   Alcohol Screen    Last Alcohol Screening Score (AUDIT): 0  Housing: Low Risk (01/21/2024)   Epic    Unable to Pay for Housing in the Last Year: No    Number of Times Moved in the Last Year: 0    Homeless in the Last Year: No  Utilities: Not At Risk (01/21/2024)   Epic    Threatened with loss of utilities: No  Health Literacy: Adequate Health Literacy (11/05/2023)   Received from [facility]   [phone] Health Literacy    How often do you need to have someone help you when you read instructions, pamphlets, or other written material from your doctor or pharmacy?: Never      Family History  Problem Relation Age of Onset   Hypertension Mother    Stroke Mother    Lung cancer Father    Non-Hodgkin's lymphoma Brother    Vitals:   01/27/24 0824  BP: (!) 112/40  Pulse: 78  SpO2: 94%  Weight: 214 lb 12.8 oz (97.4 kg)   Wt Readings from Last 3 Encounters:  01/27/24 214 lb 12.8 oz (97.4 kg)  01/25/24 213 lb 6.4 oz (96.8 kg)  01/22/24 207 lb 9.6 oz (94.2 kg)   Lab Results  Component Value Date   CREATININE 0.87 01/22/2024   CREATININE 0.77 01/21/2024   CREATININE 0.75 01/20/2024       PHYSICAL EXAM:  General: Tired appearing elderly male in wheelchair.  Cor:  Elevated JVD. Regular rhythm, rate. 3/6 systolic ejection murmur appreciated right sternal border radiating to the carotids  Lungs: clear Abdomen: soft, nontender, nondistended. Extremities: 3+ pitting edema LLE, 2+ pitting RLE Neuro:. Affect pleasant   ECG: not done  ReDs reading: 41 %, abnormal   ASSESSMENT & PLAN:  1: Chronic heart failure with preserved ejection fraction- - unclear etiology at this time likely related to  worsening TAVR but he also has untreated OSA. Myuview 11/25 without significant ischemia.  - NYHA Montes III - moderately fluid up with worsening symptoms, elevated ReDs reading - ReDs in clinic today was 41% - Same day surgery unable to give IV Lasix  today so have scheduled him for IV lasix  tomorrow. BMET/ proBNP tomorrow as well. Since getting IV lasix  tomorrow, he will hold his oral furosemide  and resume it on Saturday.  - begin jardiance  10mg  daily. Samples provided today and pharm is working to see if eligible for a grant for assistance - Echo 11/11/23: EF 50%, TAVR valve with mean gradient 32, previously was 23   - continue furosemide  80mg  today and then reduce to 60mg  daily / potassium 20meq daily - continue toprol  25mg  daily - may benefit from MRA - weighing daily and monitoring sodium / fluid intake and also measuring urine output - proBNP 01/22/24 was 1643.0  2: TAVR- - TAVR January 19, 2017 - Echo 07/17/21: EF 60-65%, G2DD, normal RV, aortic mean gradient 23 - Echo 11/11/23: EF 50%, TAVR valve with mean gradient 32, it was 23 two years ago - has upcoming echo 03/02/24  3: CAD- - CABG x3 June 2009 - saw [facility] cardiology 12/25  4: HTN- - BP 112/40 - sees PCP later today - BMET 01/25/24 reviewed: sodium 141, potassium 4.4, creatinine 0.8, GFR 88. BMET tomorrow when he gets IV lasix .   5: OSA- - not wearing CPAP  6: HLD- - LDL 11/26/23 was 76 - continue rosuvastatin  20mg  daily   Return in 2 week, sooner if needed.   I spent 45 minutes reviewing records, interviewing/ examing patient and managing plan/ orders.   Jacob DELENA Class, FNP 01/26/2024     [1]  Allergies Allergen Reactions   Atorvastatin Other (See Comments)    aches  myalgia  myalgia    aches  myalgia   Ramipril Other (See Comments) and Cough    cough

## 2024-01-27 ENCOUNTER — Encounter: Payer: Self-pay | Admitting: Family

## 2024-01-27 ENCOUNTER — Inpatient Hospital Stay: Admitting: Family

## 2024-01-27 ENCOUNTER — Other Ambulatory Visit: Payer: Self-pay | Admitting: Family

## 2024-01-27 VITALS — BP 112/40 | HR 78 | Wt 214.8 lb

## 2024-01-27 DIAGNOSIS — G4733 Obstructive sleep apnea (adult) (pediatric): Secondary | ICD-10-CM

## 2024-01-27 DIAGNOSIS — I25118 Atherosclerotic heart disease of native coronary artery with other forms of angina pectoris: Secondary | ICD-10-CM

## 2024-01-27 DIAGNOSIS — Z952 Presence of prosthetic heart valve: Secondary | ICD-10-CM | POA: Diagnosis not present

## 2024-01-27 DIAGNOSIS — E782 Mixed hyperlipidemia: Secondary | ICD-10-CM

## 2024-01-27 DIAGNOSIS — I10 Essential (primary) hypertension: Secondary | ICD-10-CM | POA: Diagnosis not present

## 2024-01-27 DIAGNOSIS — I5032 Chronic diastolic (congestive) heart failure: Secondary | ICD-10-CM

## 2024-01-27 DIAGNOSIS — I5033 Acute on chronic diastolic (congestive) heart failure: Secondary | ICD-10-CM

## 2024-01-27 NOTE — Progress Notes (Signed)
 ReDS Vest / Clip - 01/27/24 0824       ReDS Vest / Clip   Station Marker D    Ruler Value 34    ReDS Value Range High volume overload    ReDS Actual Value 41

## 2024-01-27 NOTE — Patient Instructions (Signed)
 Medication Changes:  Please report to Same Day Surgery [facility]) tomorrow 01/28/24 at 9am. DO NOT TAKE furosemide  pill tomorrow at all. Please resume your 60mg  daily on Saturday.   Lab Work:  Lab work will be done at the time of your IV lasix  appointment tomorrow.    Follow-Up in: Please follow up with the Advanced Heart Failure Clinic in 2 weeks with Ellouise Class, FNP.   Thank you for choosing [facility] Failure [facility].    At the Advanced Heart Failure Clinic, you and your health needs are our priority. We have a designated team specialized in the treatment of Heart Failure. This Care Team includes your primary Heart Failure Specialized Cardiologist (physician), Advanced Practice Providers (APPs- Physician Assistants and Nurse Practitioners), and Pharmacist who all work together to provide you with the care you need, when you need it.   You may see any of the following providers on your designated Care Team at your next follow up:  Dr. Toribio Fuel Dr. Ezra Shuck Dr. Ria Commander Dr. Morene Brownie Ellouise Class, FNP Jaun Bash, RPH-CPP  Please be sure to bring in all your medications bottles to every appointment.   Need to Contact Us :  If you have any questions or concerns before your next appointment please send us  a message through [place] or call our office at [phone].    TO LEAVE A MESSAGE FOR THE NURSE SELECT OPTION 2, PLEASE LEAVE A MESSAGE INCLUDING: YOUR NAME DATE OF BIRTH CALL BACK NUMBER REASON FOR CALL**this is important as we prioritize the call backs  YOU WILL RECEIVE A CALL BACK THE SAME DAY AS LONG AS YOU CALL BEFORE 4:00 PM

## 2024-01-28 ENCOUNTER — Ambulatory Visit
Admission: RE | Admit: 2024-01-28 | Discharge: 2024-01-28 | Disposition: A | Discharge status: Home / Self Care | Source: Ambulatory Visit | Attending: Family | Admitting: Family

## 2024-01-28 ENCOUNTER — Ambulatory Visit: Payer: Self-pay | Admitting: Family

## 2024-01-28 ENCOUNTER — Telehealth: Payer: Self-pay

## 2024-01-28 DIAGNOSIS — I5033 Acute on chronic diastolic (congestive) heart failure: Secondary | ICD-10-CM | POA: Insufficient documentation

## 2024-01-28 LAB — BASIC METABOLIC PANEL WITH GFR
Anion gap: 7 (ref 5–15)
BUN: 14 mg/dL (ref 8–23)
CO2: 30 mmol/L (ref 22–32)
Calcium: 9.4 mg/dL (ref 8.9–10.3)
Chloride: 102 mmol/L (ref 98–111)
Creatinine, Ser: 0.8 mg/dL (ref 0.61–1.24)
GFR, Estimated: >60 mL/min
Glucose, Bld: 130 mg/dL — ABNORMAL HIGH (ref 70–99)
Potassium: 4.1 mmol/L (ref 3.5–5.1)
Sodium: 139 mmol/L (ref 135–145)

## 2024-01-28 LAB — PRO BRAIN NATRIURETIC PEPTIDE: Pro Brain Natriuretic Peptide: 1299 pg/mL — ABNORMAL HIGH

## 2024-01-28 MED ORDER — FUROSEMIDE 10 MG/ML IJ SOLN
80.0000 mg | Freq: Once | INTRAMUSCULAR | Status: AC
  Administered 2024-01-28: 80 mg via INTRAVENOUS

## 2024-01-28 MED ORDER — FUROSEMIDE 10 MG/ML IJ SOLN
INTRAMUSCULAR | Status: AC
  Filled 2024-01-28: qty 8

## 2024-01-28 MED ORDER — POTASSIUM CHLORIDE CRYS ER 20 MEQ PO TBCR
40.0000 meq | EXTENDED_RELEASE_TABLET | Freq: Once | ORAL | Status: AC
  Administered 2024-01-28: 40 meq via ORAL

## 2024-01-28 MED ORDER — POTASSIUM CHLORIDE CRYS ER 20 MEQ PO TBCR
EXTENDED_RELEASE_TABLET | ORAL | Status: AC
  Filled 2024-01-28: qty 2

## 2024-01-28 NOTE — Telephone Encounter (Signed)
 Spoke with the patient and his wife and advised them of the following recommendations. The patient and his wife verbalized understanding. They were offered the first available appointment on 02/09/24 with Dr. Gollan. The patients wife stated that her son transports them to all appointments and will be out of town on the 31st. She inquired whether this is an urgent procedure or if the patient could have the TEE on 02/16/23 instead.   Furth, Cadence H, PA-C  Desiderio Russell SAILOR, RN So, I want to change the limited TTE to a TEE to look at the valve. We can cancel limited echo. He reported one side paralyzed vocal chord which is moderate contraindication, but is unsure which side. We should schedule with Dr. Gollan.

## 2024-01-31 ENCOUNTER — Telehealth: Payer: Self-pay

## 2024-01-31 ENCOUNTER — Telehealth: Payer: Self-pay | Admitting: Cardiovascular Disease

## 2024-01-31 NOTE — Telephone Encounter (Signed)
 Wife is calling in regards to the patient fluids, and patient is very weak. Please advise

## 2024-01-31 NOTE — Telephone Encounter (Signed)
 Called and spoke with Dickey Balloon, patient's wife, per DPR.  Dickey states that patient has swollen lower extremities that is not being resolved with Lasix .  Dickey wants the patient to be seen in the office by a provider.  Patient scheduled for an office with Dr. Argentina on Tuesday, 02/01/24 at 0820.  Wife appreciative of the phone call and scheduling the office visit.  Wife advised of the protocols to take the patient to the Emergency room and/or call 911.  Dickey verbalized understanding with all questions and concerns addressed at this time.

## 2024-01-31 NOTE — Telephone Encounter (Signed)
 Callers name: Dickey Relation to patient:   Call back phone #:   Pharmacy (if applicable):   Issue/reason for call: pt's wife called concerned about pt's on going fluid retention. Spoke to pt. Pt audibly short of breath. Pt states that he has increased swelling in his feet and legs as well as new swelling in his hands. States that he also recently developed a dry cough. Pt bp have been soft, around 101/61. Pt wife states they were able to get an appt with [facility] and feels comfortable waiting until tomorrow morning for the appt. Advised if his shortness of breathing gets any worse to report to the emergency room. Pt and wife really want to avoid the emergency room. If at all possible.

## 2024-01-31 NOTE — Telephone Encounter (Signed)
 Per pt's wife, pt would like to proceed with TEE on 02/09/24.  TEE scheduled on 02/09/24 at 7:30 am with Dr. Gollan as recommended and instructions sent via mychart

## 2024-01-31 NOTE — Telephone Encounter (Signed)
 Please see previous encounter

## 2024-02-01 ENCOUNTER — Observation Stay
Admit: 2024-02-01 | Discharge: 2024-02-01 | Disposition: A | Discharge status: Home / Self Care | Attending: Hospitalist | Admitting: Hospitalist

## 2024-02-01 ENCOUNTER — Telehealth: Payer: Self-pay

## 2024-02-01 ENCOUNTER — Ambulatory Visit

## 2024-02-01 ENCOUNTER — Other Ambulatory Visit: Payer: Self-pay

## 2024-02-01 ENCOUNTER — Other Ambulatory Visit (HOSPITAL_COMMUNITY): Payer: Self-pay

## 2024-02-01 ENCOUNTER — Inpatient Hospital Stay
Admission: EM | Admit: 2024-02-01 | Discharge: 2024-02-06 | DRG: 286 | Attending: Osteopathic Medicine | Admitting: Osteopathic Medicine

## 2024-02-01 ENCOUNTER — Emergency Department

## 2024-02-01 DIAGNOSIS — J9601 Acute respiratory failure with hypoxia: Secondary | ICD-10-CM | POA: Diagnosis present

## 2024-02-01 DIAGNOSIS — Z7982 Long term (current) use of aspirin: Secondary | ICD-10-CM

## 2024-02-01 DIAGNOSIS — I509 Heart failure, unspecified: Secondary | ICD-10-CM

## 2024-02-01 DIAGNOSIS — I2489 Other forms of acute ischemic heart disease: Secondary | ICD-10-CM | POA: Diagnosis present

## 2024-02-01 DIAGNOSIS — I272 Pulmonary hypertension, unspecified: Secondary | ICD-10-CM | POA: Diagnosis present

## 2024-02-01 DIAGNOSIS — Z85828 Personal history of other malignant neoplasm of skin: Secondary | ICD-10-CM

## 2024-02-01 DIAGNOSIS — Z801 Family history of malignant neoplasm of trachea, bronchus and lung: Secondary | ICD-10-CM

## 2024-02-01 DIAGNOSIS — I350 Nonrheumatic aortic (valve) stenosis: Secondary | ICD-10-CM | POA: Diagnosis present

## 2024-02-01 DIAGNOSIS — Z86711 Personal history of pulmonary embolism: Secondary | ICD-10-CM

## 2024-02-01 DIAGNOSIS — I2510 Atherosclerotic heart disease of native coronary artery without angina pectoris: Secondary | ICD-10-CM | POA: Diagnosis present

## 2024-02-01 DIAGNOSIS — I5021 Acute systolic (congestive) heart failure: Secondary | ICD-10-CM

## 2024-02-01 DIAGNOSIS — Z951 Presence of aortocoronary bypass graft: Secondary | ICD-10-CM

## 2024-02-01 DIAGNOSIS — K219 Gastro-esophageal reflux disease without esophagitis: Secondary | ICD-10-CM | POA: Diagnosis present

## 2024-02-01 DIAGNOSIS — J45909 Unspecified asthma, uncomplicated: Secondary | ICD-10-CM | POA: Diagnosis present

## 2024-02-01 DIAGNOSIS — T82857D Stenosis of cardiac prosthetic devices, implants and grafts, subsequent encounter: Secondary | ICD-10-CM | POA: Diagnosis not present

## 2024-02-01 DIAGNOSIS — Z9049 Acquired absence of other specified parts of digestive tract: Secondary | ICD-10-CM

## 2024-02-01 DIAGNOSIS — Z807 Family history of other malignant neoplasms of lymphoid, hematopoietic and related tissues: Secondary | ICD-10-CM

## 2024-02-01 DIAGNOSIS — I359 Nonrheumatic aortic valve disorder, unspecified: Secondary | ICD-10-CM

## 2024-02-01 DIAGNOSIS — Z8673 Personal history of transient ischemic attack (TIA), and cerebral infarction without residual deficits: Secondary | ICD-10-CM

## 2024-02-01 DIAGNOSIS — E785 Hyperlipidemia, unspecified: Secondary | ICD-10-CM | POA: Diagnosis present

## 2024-02-01 DIAGNOSIS — Z888 Allergy status to other drugs, medicaments and biological substances status: Secondary | ICD-10-CM

## 2024-02-01 DIAGNOSIS — I10 Essential (primary) hypertension: Secondary | ICD-10-CM | POA: Diagnosis present

## 2024-02-01 DIAGNOSIS — I5031 Acute diastolic (congestive) heart failure: Secondary | ICD-10-CM

## 2024-02-01 DIAGNOSIS — Z952 Presence of prosthetic heart valve: Secondary | ICD-10-CM

## 2024-02-01 DIAGNOSIS — Z811 Family history of alcohol abuse and dependence: Secondary | ICD-10-CM

## 2024-02-01 DIAGNOSIS — Z1152 Encounter for screening for COVID-19: Secondary | ICD-10-CM

## 2024-02-01 DIAGNOSIS — I110 Hypertensive heart disease with heart failure: Principal | ICD-10-CM | POA: Diagnosis present

## 2024-02-01 DIAGNOSIS — I5033 Acute on chronic diastolic (congestive) heart failure: Secondary | ICD-10-CM | POA: Diagnosis not present

## 2024-02-01 DIAGNOSIS — E876 Hypokalemia: Secondary | ICD-10-CM | POA: Diagnosis present

## 2024-02-01 DIAGNOSIS — Z823 Family history of stroke: Secondary | ICD-10-CM

## 2024-02-01 DIAGNOSIS — Z79899 Other long term (current) drug therapy: Secondary | ICD-10-CM

## 2024-02-01 DIAGNOSIS — G4733 Obstructive sleep apnea (adult) (pediatric): Secondary | ICD-10-CM | POA: Diagnosis present

## 2024-02-01 DIAGNOSIS — Y831 Surgical operation with implant of artificial internal device as the cause of abnormal reaction of the patient, or of later complication, without mention of misadventure at the time of the procedure: Secondary | ICD-10-CM | POA: Diagnosis present

## 2024-02-01 DIAGNOSIS — I6523 Occlusion and stenosis of bilateral carotid arteries: Secondary | ICD-10-CM | POA: Diagnosis present

## 2024-02-01 DIAGNOSIS — Z86718 Personal history of other venous thrombosis and embolism: Secondary | ICD-10-CM

## 2024-02-01 DIAGNOSIS — Z87891 Personal history of nicotine dependence: Secondary | ICD-10-CM

## 2024-02-01 DIAGNOSIS — T82857A Stenosis of cardiac prosthetic devices, implants and grafts, initial encounter: Secondary | ICD-10-CM | POA: Diagnosis present

## 2024-02-01 DIAGNOSIS — Z8249 Family history of ischemic heart disease and other diseases of the circulatory system: Secondary | ICD-10-CM

## 2024-02-01 HISTORY — DX: Chronic diastolic (congestive) heart failure: I50.32

## 2024-02-01 LAB — BASIC METABOLIC PANEL WITH GFR
Anion gap: 8 (ref 5–15)
BUN: 25 mg/dL — ABNORMAL HIGH (ref 8–23)
CO2: 31 mmol/L (ref 22–32)
Calcium: 9.3 mg/dL (ref 8.9–10.3)
Chloride: 101 mmol/L (ref 98–111)
Creatinine, Ser: 0.87 mg/dL (ref 0.61–1.24)
GFR, Estimated: >60 mL/min
Glucose, Bld: 93 mg/dL (ref 70–99)
Potassium: 4.6 mmol/L (ref 3.5–5.1)
Sodium: 139 mmol/L (ref 135–145)

## 2024-02-01 LAB — CBC
HCT: 38.6 % — ABNORMAL LOW (ref 39.0–52.0)
Hemoglobin: 12.9 g/dL — ABNORMAL LOW (ref 13.0–17.0)
MCH: 32.6 pg (ref 26.0–34.0)
MCHC: 33.4 g/dL (ref 30.0–36.0)
MCV: 97.5 fL (ref 80.0–100.0)
Platelets: 133 K/uL — ABNORMAL LOW (ref 150–400)
RBC: 3.96 MIL/uL — ABNORMAL LOW (ref 4.22–5.81)
RDW: 14.6 % (ref 11.5–15.5)
WBC: 5.4 K/uL (ref 4.0–10.5)
nRBC: 0 % (ref 0.0–0.2)

## 2024-02-01 LAB — RESP PANEL BY RT-PCR (RSV, FLU A&B, COVID)  RVPGX2
Influenza A by PCR: NEGATIVE
Influenza B by PCR: NEGATIVE
Resp Syncytial Virus by PCR: NEGATIVE
SARS Coronavirus 2 by RT PCR: NEGATIVE

## 2024-02-01 LAB — PRO BRAIN NATRIURETIC PEPTIDE: Pro Brain Natriuretic Peptide: 2213 pg/mL — ABNORMAL HIGH

## 2024-02-01 LAB — TROPONIN T, HIGH SENSITIVITY: Troponin T High Sensitivity: 30 ng/L — ABNORMAL HIGH (ref 0–19)

## 2024-02-01 MED ORDER — METOPROLOL SUCCINATE ER 25 MG PO TB24: 25.0000 mg | ORAL_TABLET | Freq: Every day | ORAL | Status: DC

## 2024-02-01 MED ORDER — ROSUVASTATIN CALCIUM 20 MG PO TABS
20.0000 mg | ORAL_TABLET | Freq: Every day | ORAL | Status: DC
  Administered 2024-02-01 – 2024-02-02 (×2): 20 mg via ORAL
  Filled 2024-02-01: qty 1
  Filled 2024-02-01: qty 2
  Filled 2024-02-01: qty 1

## 2024-02-01 MED ORDER — PANTOPRAZOLE SODIUM 40 MG PO TBEC
40.0000 mg | DELAYED_RELEASE_TABLET | Freq: Two times a day (BID) | ORAL | Status: DC
  Administered 2024-02-01 – 2024-02-06 (×9): 40 mg via ORAL
  Filled 2024-02-01 (×9): qty 1

## 2024-02-01 MED ORDER — FUROSEMIDE 10 MG/ML IJ SOLN
80.0000 mg | Freq: Once | INTRAMUSCULAR | Status: AC
  Administered 2024-02-01: 80 mg via INTRAVENOUS
  Filled 2024-02-01: qty 8

## 2024-02-01 MED ORDER — MORPHINE SULFATE (PF) 2 MG/ML IV SOLN: 2.0000 mg | INTRAVENOUS | Status: DC | PRN

## 2024-02-01 MED ORDER — ENOXAPARIN SODIUM 40 MG/0.4ML IJ SOSY: 40.0000 mg | PREFILLED_SYRINGE | INTRAMUSCULAR | Status: DC

## 2024-02-01 MED ORDER — OXYCODONE HCL 5 MG PO TABS: 5.0000 mg | ORAL_TABLET | ORAL | Status: DC | PRN

## 2024-02-01 MED ORDER — POLYETHYLENE GLYCOL 3350 17 G PO PACK: 17.0000 g | PACK | Freq: Every day | ORAL | Status: DC | PRN

## 2024-02-01 MED ORDER — FUROSEMIDE 10 MG/ML IJ SOLN
40.0000 mg | Freq: Two times a day (BID) | INTRAMUSCULAR | Status: DC
  Administered 2024-02-01 – 2024-02-02 (×3): 40 mg via INTRAVENOUS
  Filled 2024-02-01 (×3): qty 4

## 2024-02-01 MED ORDER — ENOXAPARIN SODIUM 60 MG/0.6ML IJ SOSY
45.0000 mg | PREFILLED_SYRINGE | INTRAMUSCULAR | Status: DC
  Administered 2024-02-01 – 2024-02-03 (×3): 45 mg via SUBCUTANEOUS
  Filled 2024-02-01 (×3): qty 0.6

## 2024-02-01 MED ORDER — ACETAMINOPHEN 650 MG RE SUPP: 650.0000 mg | Freq: Four times a day (QID) | RECTAL | Status: DC | PRN

## 2024-02-01 MED ORDER — ONDANSETRON HCL 4 MG/2ML IJ SOLN: 4.0000 mg | Freq: Four times a day (QID) | INTRAMUSCULAR | Status: DC | PRN

## 2024-02-01 MED ORDER — ACETAMINOPHEN 325 MG PO TABS: 650.0000 mg | ORAL_TABLET | Freq: Four times a day (QID) | ORAL | Status: DC | PRN

## 2024-02-01 MED ORDER — ASPIRIN 81 MG PO CHEW
81.0000 mg | CHEWABLE_TABLET | Freq: Every day | ORAL | Status: DC
  Administered 2024-02-01 – 2024-02-05 (×5): 81 mg via ORAL
  Filled 2024-02-01 (×5): qty 1

## 2024-02-01 MED ORDER — AMLODIPINE BESYLATE 5 MG PO TABS: 10.0000 mg | ORAL_TABLET | Freq: Every day | ORAL | Status: DC

## 2024-02-01 MED ORDER — POTASSIUM CHLORIDE CRYS ER 20 MEQ PO TBCR
20.0000 meq | EXTENDED_RELEASE_TABLET | Freq: Every day | ORAL | Status: DC
  Administered 2024-02-01 – 2024-02-05 (×5): 20 meq via ORAL
  Filled 2024-02-01 (×6): qty 1

## 2024-02-01 MED ORDER — METOPROLOL SUCCINATE ER 25 MG PO TB24
25.0000 mg | ORAL_TABLET | Freq: Every day | ORAL | Status: DC
  Administered 2024-02-01 – 2024-02-02 (×2): 25 mg via ORAL
  Administered 2024-02-03: 12.5 mg via ORAL
  Administered 2024-02-05: 25 mg via ORAL
  Filled 2024-02-01 (×4): qty 1

## 2024-02-01 MED ORDER — PERFLUTREN LIPID MICROSPHERE
1.0000 mL | INTRAVENOUS | Status: AC | PRN
  Administered 2024-02-01: 2 mL via INTRAVENOUS

## 2024-02-01 MED ORDER — AMLODIPINE BESYLATE 5 MG PO TABS
10.0000 mg | ORAL_TABLET | Freq: Every day | ORAL | Status: DC
  Administered 2024-02-02: 10 mg via ORAL
  Filled 2024-02-01: qty 1

## 2024-02-01 MED ORDER — MONTELUKAST SODIUM 10 MG PO TABS: 10.0000 mg | ORAL_TABLET | Freq: Every evening | ORAL | Status: DC | PRN

## 2024-02-01 MED ORDER — ONDANSETRON HCL 4 MG PO TABS
4.0000 mg | ORAL_TABLET | Freq: Four times a day (QID) | ORAL | Status: DC | PRN
  Administered 2024-02-03: 4 mg via ORAL
  Filled 2024-02-01: qty 1

## 2024-02-01 MED ORDER — ADULT MULTIVITAMIN W/MINERALS CH
1.0000 | ORAL_TABLET | Freq: Every day | ORAL | Status: DC
  Administered 2024-02-01 – 2024-02-06 (×6): 1 via ORAL
  Filled 2024-02-01 (×7): qty 1

## 2024-02-01 MED ORDER — ROSUVASTATIN CALCIUM 20 MG PO TABS
20.0000 mg | ORAL_TABLET | Freq: Every day | ORAL | Status: DC
  Filled 2024-02-01: qty 1

## 2024-02-01 NOTE — H&P (Signed)
 "  History and Physical    Jacob Montes FMW:[id number] DOB: May 29, 1940 DOA: 02/01/2024  DOS: the patient was seen and examined on 02/01/2024  PCP: [name], MD   Patient coming from: Home  I have personally briefly reviewed patient's old medical records in [facility]  Chief Complaint: Shortness of breath  HPI: Jacob Montes is a pleasant 83 y.o. 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  Patient was in the hospital between 01/19/2024 and 02/08/2024 for decompensated heart failure.  Patient was treated with IV antibiotic with some improvement.  Patient was followed up in cardiology clinic on 01/27/2024, he was treated with IV diuretics then, today patient went to cardiology office where he reported not feeling well rather his swelling did not get better and got worse.  He complained of significant shortness of breath, orthopnea, hypoxia and lower extremity edema.  Oxygen saturation at the clinic today was 84% on room air.  Dr. Argentina from cardiology transferred the patient for further diuresis and treatment of congestive heart failure.  Patient denies any fever, chills, nausea, vomiting.  He complains of progressive worsening shortness of breath with leg swelling.  He also complains of some orthopnea denies any PND.  Denies any palpitations.  ED Course: Upon arrival to the ED, patient is found to be tachypneic at 22, hypoxic requiring oxygen, room air oxygen saturation was 84%, 92% on 2 L.  Patient was given 80 mg of IV Lasix  with some improvement.  Hospitalist service was consulted for evaluation for admission for acute congestive heart failure exacerbation with hypoxia requiring IV diuresis.  Review of Systems:  ROS  All  other systems negative except as noted in the HPI.  Past Medical History:  Diagnosis Date   Actinic keratosis    Acute exacerbation of CHF (congestive heart failure) (HCC) 01/19/2024   Aortic stenosis    a. s/p TAVR 03/2017   CAD (coronary artery disease)    a. s/p 3-v CABG in 07/2007   Cancer [facility])    Skin   Carotid artery disease    a. 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   GERD (gastroesophageal reflux disease)    HLD (hyperlipidemia)    Hyperlipidemia 10/15/2015   Hypertension    Nephrolithiasis 11/16/2018   Pulmonary embolism (HCC) 08/2010   Skin cancer    Sleep apnea    Stroke [facility])     Past Surgical History:  Procedure Laterality Date   CARDIAC CATHETERIZATION     CARDIAC VALVE REPLACEMENT  01-19-17   Carotid surgery Left 03/08/2007   CHOLECYSTECTOMY  02/16/2018   COLONOSCOPY WITH PROPOFOL  N/A 09/01/2022   Procedure: COLONOSCOPY WITH PROPOFOL ;  Surgeon: Maryruth Ole DASEN, MD;  Location: [facility] ENDOSCOPY;  Service: Endoscopy;  Laterality: N/A;   CORONARY ARTERY BYPASS GRAFT     ENDOSCOPIC RETROGRADE CHOLANGIOPANCREATOGRAPHY (ERCP) WITH PROPOFOL  N/A 07/10/2021   Procedure: ENDOSCOPIC RETROGRADE CHOLANGIOPANCREATOGRAPHY (ERCP) WITH PROPOFOL ;  Surgeon: Jinny Carmine, MD;  Location: [facility] ENDOSCOPY;  Service: Endoscopy;  Laterality: N/A;   ERCP N/A 11/18/2018   Procedure: ENDOSCOPIC RETROGRADE CHOLANGIOPANCREATOGRAPHY (ERCP);  Surgeon: Jinny Carmine, MD;  Location[facility] ENDOSCOPY;  Service: Endoscopy;  Laterality: N/A;   EUS N/A 08/21/2021   Procedure: UPPER ENDOSCOPIC ULTRASOUND (EUS) LINEAR;  Surgeon: Queenie Asberry LABOR, MD;  Location: [facility] ENDOSCOPY;  Service: Gastroenterology;  Laterality: N/A;  [facility]   EYE SURGERY  04-15-99   09-14-99   Heart Bypass N/A 07/2007   HERNIA REPAIR  03-12-88   Not sure of date   left shoulder surgery Left 06/03/2009   POLYPECTOMY   09/01/2022   Procedure: POLYPECTOMY;  Surgeon: Maryruth Ole DASEN, MD;  Location: [facility] ENDOSCOPY;  Service: Endoscopy;;   RIGHT AND LEFT HEART CATH  07/18/2007   right shoulder surgery  2003   TEE WITHOUT CARDIOVERSION N/A 11/21/2018   Procedure: TRANSESOPHAGEAL ECHOCARDIOGRAM (TEE);  Surgeon: Darron Deatrice LABOR, MD;  Location: [facility] ORS;  Service: Cardiovascular;  Laterality: N/A;   TVAR       reports that he quit smoking about 47 years ago. His smoking use included cigarettes. He has never used smokeless tobacco. He reports that he does not drink alcohol and does not use drugs.  Allergies[1]  Family History  Problem Relation Age of Onset   Hypertension Mother    Stroke Mother    Lung cancer Father    Alcohol abuse Father    Non-Hodgkin's lymphoma Brother    Varicose Veins Brother     Prior to Admission medications  Medication Sig Start Date End Date Taking? Authorizing Provider  amLODipine  (NORVASC ) 10 MG tablet Take 1 tablet by mouth once daily 06/04/21  Yes [name], MD  aspirin  81 MG chewable tablet Chew 81 mg by mouth daily.   Yes [provider]  docusate sodium  (COLACE) 250 MG capsule Take 250 mg by mouth daily as needed for constipation.    Yes [provider]  fluticasone (FLONASE) 50 MCG/ACT nasal spray Place 2 sprays into both nostrils daily as needed for allergies or rhinitis. 11/05/23 11/04/24 Yes [provider]  furosemide  (LASIX ) 40 MG tablet Take 1.5 tablets (60 mg total) by mouth daily. 01/25/24  Yes Furth, Cadence H, PA-C  metoprolol  succinate (TOPROL -XL) 25 MG 24 hr tablet Take 1 tablet (25 mg total) by mouth daily. Take with or immediately following a meal. 01/03/24 12/28/24 Yes Gollan, Timothy J, MD  montelukast  (SINGULAIR ) 10 MG tablet Take 1 tablet (10 mg total) by mouth at bedtime as needed (allergies). 05/22/19  Yes [name], MD  ondansetron  (ZOFRAN -ODT) 4 MG disintegrating tablet Take 1 tablet (4 mg total) by mouth  every 8 (eight) hours as needed for nausea or vomiting. 05/20/22  Yes Dorothyann Drivers, MD  potassium chloride  20 MEQ TBCR Take 1 tablet (20 mEq total) by mouth daily. Take with lasix  01/25/24  Yes Furth, Cadence H, PA-C  potassium chloride  SA (KLOR-CON  M) 20 MEQ tablet Take 20 mEq by mouth daily. 01/25/24  Yes [provider]  rosuvastatin  (CRESTOR ) 20 MG tablet Take 1 tablet (20 mg total) by mouth daily. 12/17/21  Yes Simmons-Robinson, Makiera, MD  cetirizine (ZYRTEC) 10 MG tablet Take 10 mg by mouth daily. 11/05/23   [provider]  Lactobacillus (PROBIOTIC ACIDOPHILUS PO) Take by mouth daily.    [provider]  Multiple Vitamin (MULTIVITAMIN) tablet Take 1 tablet by mouth daily.    [provider]  omeprazole (PRILOSEC) 20 MG capsule Take 20 mg by mouth daily. As needed    [provider]  tamsulosin  (FLOMAX ) 0.4 MG CAPS capsule Take 1 capsule by mouth once daily Patient not taking: No sig reported 12/18/20  [name], MD    Physical Exam: Vitals:   02/01/24 0906 02/01/24 1130 02/01/24 1230 02/01/24 1300  BP: (!) 144/52 (!) 118/51 (!) 130/55 (!) 130/45  Pulse: 77 70 72 70  Resp: (!) 22 20 15 15   Temp: 98.3 F (36.8 C)     SpO2: 92% 97% 98% 97%    Physical Exam   Constitutional: Alert, awake, calm, comfortable HEENT: Neck supple Respiratory: Bilateral decreased air entry at the bases.  He has a fine basal rales. Cardiovascular: Regular rate and rhythm, no murmurs / rubs / gallops.  3+ bilateral pitting edema in lower extremities, 2+ pedal pulses. No carotid bruits.  Abdomen: Soft, no tenderness, Bowel sounds positive.  Musculoskeletal: no clubbing / cyanosis. Good ROM, no contractures. Normal muscle tone.  Skin: no rashes, lesions, ulcers. Neurologic: CN 2-12 grossly intact. Sensation intact, No focal deficit identified Psychiatric: Alert and oriented x 3. Normal mood.    Labs on Admission: I have personally reviewed  following labs and imaging studies  CBC: Recent Labs  Lab 02/01/24 1042  WBC 5.4  HGB 12.9*  HCT 38.6*  MCV 97.5  PLT 133*   Basic Metabolic Panel: Recent Labs  Lab 01/28/24 0930 02/01/24 1042  NA 139 139  K 4.1 4.6  CL 102 101  CO2 30 31  GLUCOSE 130* 93  BUN 14 25*  CREATININE 0.80 0.87  CALCIUM  9.4 9.3   GFR: Estimated Creatinine Clearance: 69.3 mL/min (by C-G formula based on SCr of 0.87 mg/dL). Liver Function Tests: No results for input(s): AST, ALT, ALKPHOS, BILITOT, PROT, ALBUMIN in the last 168 hours. No results for input(s): LIPASE, AMYLASE in the last 168 hours. No results for input(s): AMMONIA in the last 168 hours. Coagulation Profile: No results for input(s): INR, PROTIME in the last 168 hours. Cardiac Enzymes: No results for input(s): CKTOTAL, CKMB, CKMBINDEX, TROPONINI, TROPONINIHS in the last 168 hours. BNP (last 3 results) No results for input(s): BNP in the last 8760 hours. HbA1C: No results for input(s): HGBA1C in the last 72 hours. CBG: No results for input(s): GLUCAP in the last 168 hours. Lipid Profile: No results for input(s): CHOL, HDL, LDLCALC, TRIG, CHOLHDL, LDLDIRECT in the last 72 hours. Thyroid  Function Tests: No results for input(s): TSH, T4TOTAL, FREET4, T3FREE, THYROIDAB in the last 72 hours. Anemia Panel: No results for input(s): VITAMINB12, FOLATE, FERRITIN, TIBC, IRON, RETICCTPCT in the last 72 hours. Urine analysis:    Component Value Date/Time   COLORURINE YELLOW (A) 03/04/2023 1311   APPEARANCEUR CLEAR (A) 03/04/2023 1311   LABSPEC 1.012 03/04/2023 1311   PHURINE 5.0 03/04/2023 1311   GLUCOSEU NEGATIVE 03/04/2023 1311   HGBUR NEGATIVE 03/04/2023 1311   BILIRUBINUR NEGATIVE 03/04/2023 1311   BILIRUBINUR Negative 10/14/2021 1059   KETONESUR NEGATIVE 03/04/2023 1311   PROTEINUR NEGATIVE 03/04/2023 1311   UROBILINOGEN 0.2 10/14/2021 1059   NITRITE  NEGATIVE 03/04/2023 1311   LEUKOCYTESUR NEGATIVE 03/04/2023 1311    Radiological Exams on Admission: I have personally reviewed images DG Chest 2 View Result Date: 02/01/2024 EXAM: 2 VIEW(S) XRAY OF THE CHEST 02/01/2024 09:25:00 AM COMPARISON: 01/19/2024 CLINICAL HISTORY: 83 year old male with persistent shortness of breath for 1 month. FINDINGS: LUNGS AND PLEURA: Vascular congestion/mild interstitial pulmonary edema. Small to moderate bilateral pleural effusions. Bibasilar airspace opacities. No pneumothorax. No significant change since 01/19/2024. HEART AND MEDIASTINUM: Cardiomegaly. Prosthetic aortic valve. BONES AND SOFT TISSUES: Prior median sternotomy. No acute osseous abnormality. IMPRESSION: 1. Pulmonary vascular congestion with bilateral pleural effusions not  significantly changed since December/11/2023 2. No new cardiopulmonary abnormality. Electronically signed by: Helayne Hurst MD 02/01/2024 10:44 AM EST RP Workstation: [id number]    EKG: My personal interpretation of EKG shows: Sinus rhythm at 76 bpm, no ST elevation    Assessment/Plan Principal Problem:   CHF (congestive heart failure) (HCC) Active Problems:   History of CAD   Morbid obesity (HCC)   Benign essential hypertension   Dyslipidemia    Assessment and Plan: 83 year old male with history of HTN, CAD s/p CABG, severe AS s/p TAVR, carotid stenosis s/p left carotid endarterectomy who came into ED when he was referred from cardiologist office for worsening congestive heart failure exacerbation.  1.  Acute exacerbation of congestive heart failure with hypoxia - Last echo was done which showed EF from 50 to 55%. - He was getting IV and p.o. Lasix  at outpatient but failed. - Patient had hypoxemia in the emergency and cardiology clinic. - He was saturating 84% on room air. - Dr. Argentina advised for more diuresis. - He received 80 mg IV Lasix  in the emergency room. - He will be placed on Lasix  40 mg twice daily. - He  has been placed on congestive heart failure protocol - Will follow the recommendation from cardiology  2.  HTN - Blood pressure has been stable - Continue amlodipine  10 mg daily, metoprolol  25 mg, Lasix  40 mg twice daily. - Continue monitor blood pressure  3.  HLD - Continue on rosuvastatin  20 mg  4.  History of asthma - Continue Singulair  - Will place him on nebulizer as needed  5.  History of CAD s/p CABG, AS s/p TAVR - Continue aspirin , metoprolol , rosuvastatin   DVT prophylaxis: Lovenox  Code Status: Full Code Family Communication: Wife was at bedside Disposition Plan: Home Consults called: Cardiac Admission status: Observation, Telemetry bed   Nena Rebel, MD [facility] 02/01/2024, 1:26 PM         [1]  Allergies Allergen Reactions   Atorvastatin Other (See Comments)    aches  myalgia  myalgia    aches  myalgia   Ramipril Other (See Comments) and Cough    cough   "

## 2024-02-01 NOTE — Progress Notes (Signed)
 Heart Failure Navigator Progress Note  [name] patient.  He has a follow-up scheduled for 02/11/24 @ 8:30 AM.  Patient and wife aware of scheduled appointment.  Also added to patient AVS for discharge.  Navigator will sign off at this time.  Charmaine Pines, RN, BSN [facility] Heart Failure Navigator Secure Chat Only

## 2024-02-01 NOTE — ED Triage Notes (Addendum)
 Pt comes with c/o sob for few weeks. Pt was admitted recently for this and placed on O2 as needed at home. Pt went to [facility] today for his sob and bilateral leg edema. Pt brought here to ED for further evaluation.  Pt has pitting edema noted to legs. Pt is on lasix . Pt has hx of CHF. Pt was in [place]s next door and they placed on 2L.

## 2024-02-01 NOTE — Progress Notes (Signed)
 " Cardiology Office Note   Date:  02/01/2024  ID:  Jacob Montes, DOB 18-Apr-1940, MRN [id number] PCP: [name], MD  [facility] Providers Cardiologist:  Evalene Lunger, MD     History of Present Illness Jacob Montes is a 83 y.o. male PMH HTN, CAD status post CABG 07/2007, severe AS s/p TAVR 01/2017, carotid stenosis status post left carotid endarterectomy 2009 who presents for further evaluation and management of lower extremity edema.  Patient had a recent hospitalization 01/19/2024-01/22/24 for decompensated heart failure.  He was treated with IV diuretics with some improvement.  He has had follow-up appointments with Cadence Furth on 01/25/2024 and Ellouise Class on 01/27/2024.  He was treated with IV diuretics on 01/27/2024.  He reports he is not feeling any better.  BNP 01/28/2024 was 1300; 1643 01/22/24 and 1383 01/19/24. Cr 0.80.   Patient says he is not feeling any better since discharge.  He still has significant orthopnea, hypoxemia, and lower extremity edema.  O2 saturation was 84% on presentation today.  Relevant CVD History -SPECT 12/2023 small fixed defect distal inferolateral wall -TTE 11/11/2023 LVEF 50 to 55%, grade 2 diastolic dysfunction, normal RV size and function, mild to moderate MR, moderate-severe stenosis of TAVR valve with mild PVL (Vmax 3.64, DVI 0.26, MG 32 mmHg with mild PVL). -TTE 07/2021 TAVR valve Vmax 3.32 with Mg 23, DVI 0.34   ROS: Pt denies any chest discomfort, jaw pain, arm pain, palpitations, syncope, presyncope.  Studies Reviewed I have independently reviewed the patient's ECG, previous cardiac testing, recent blood work, recent medical records.  Physical Exam VS:  BP 116/62 (BP Location: Left Arm, Patient Position: Sitting, Cuff Size: Large)   Pulse 72   Ht 5' 6 (1.676 m)   Wt 209 lb (94.8 kg)   SpO2 (!) 84% Comment: Notified provider of readings, will start patient on 2L of O2  BMI 33.73 kg/m        Wt Readings from Last 3  Encounters:  02/01/24 209 lb (94.8 kg)  01/27/24 214 lb 12.8 oz (97.4 kg)  01/25/24 213 lb 6.4 oz (96.8 kg)    GEN: No acute distress. NECK: JVP to the mandible CARDIAC: RRR, no murmurs, rubs, gallops. RESPIRATORY: Trace Rales EXTREMITIES:  Warm and well-perfused.  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  He is had outpatient diuretic failure with 2 different providers at this point.  He still complains of orthopnea, lower extremity edema, and is unable to sleep at night due to dyspnea.  His oxygen saturations were 84% on presentation to the office today, we had to give him some supplemental oxygen while here.  Etiology of his decompensated heart failure seems most consistent with progressive stenosis of his TAVR valve versus HFpEF.  His most recent echo showed V-max 3.64 with DVI 0.26 and MG 32.  He will likely need to be considered for valve-in-valve TAVR at some point.  In the meantime, given his hypoxemia, orthopnea, lower extremity edema, elevated JVP, and two failures of outpatient diuretics, I am recommending hospitalization.  Plan: - Proceed to ED for inpatient diuresis and management of acute decompensated heart failure - I will place referral to valve clinic for consideration of valve-in-valve TAVR or other treatment options - He has a TEE scheduled for 02/09/2024 to further evaluate his valve; we may want to consider doing  this inpatient after we have achieved good diuresis - Considered outpatient management; I do not think this will be effective since he has had two failed trials already (including IV diuresis with HF team)        Dispo: Proceed to ED for inpatient diuresis  Signed, Caron Poser, MD  "

## 2024-02-01 NOTE — Patient Instructions (Addendum)
 Medication Instructions:  Your physician recommends that you continue on your current medications as directed. Please refer to the Current Medication list given to you today.  *If you need a refill on your cardiac medications before your next appointment, please call your pharmacy*  Lab Work: No labs ordered today  If you have labs (blood work) drawn today and your tests are completely normal, you will receive your results only by: MyChart Message (if you have MyChart) OR A paper copy in the mail If you have any lab test that is abnormal or we need to change your treatment, we will call you to review the results.  Testing/Procedures: No test ordered today   Follow-Up:  Referral to [facility], you and your health needs are our priority.  As part of our continuing mission to provide you with exceptional heart care, our providers are all part of one team.  This team includes your primary Cardiologist (physician) and Advanced Practice Providers or APPs (Physician Assistants and Nurse Practitioners) who all work together to provide you with the care you need, when you need it.  Your next appointment:  January 26 @  9:40 am  with Dr. Gollan    Provider:   You may see Timothy Gollan, MD or one of the following Advanced Practice Providers on your designated Care Team:   Lonni Meager, NP Lesley Maffucci, PA-C Bernardino Bring, PA-C Cadence Pitts, PA-C Tylene Lunch, NP Barnie Hila, NP   We recommend signing up for the patient portal called MyChart.  Sign up information is provided on this After Visit Summary.  MyChart is used to connect with patients for Virtual Visits (Telemedicine).  Patients are able to view lab/test results, encounter notes, upcoming appointments, etc.  Non-urgent messages can be sent to your provider as well.   To learn more about what you can do with MyChart, go to [email or web address].   OTHER:  Pt needs IV diuretics; O2 supplement;   will walk pt [facility] ED with his family.

## 2024-02-01 NOTE — Telephone Encounter (Signed)
 Advanced Heart Failure Patient Advocate Encounter  The patient was approved for a [employer] grant that will help cover the cost of Cardiomyopathy medications.  Total amount awarded, $7,500.  Effective: 01/02/2024 - 12/31/2024.  BIN [id number] PCN [id number] [id number] ID [id number]  Patient not currently active for covered medications. Will be available for patient after discharge if needed.  Rachel DEL, CPhT Rx Patient Advocate Phone: [phone]

## 2024-02-01 NOTE — ED Notes (Signed)
 Unable to get any blood at this time. Pt stuck multiple times

## 2024-02-01 NOTE — ED Notes (Signed)
 Called CCMD for central monitoring at this time

## 2024-02-01 NOTE — ED Provider Notes (Signed)
 "  [facility] Provider Note    Event Date/Time   First MD Initiated Contact with Patient 02/01/24 1020     (approximate)   History   Shortness of Breath   HPI  Jacob Montes is a 83 y.o. male who comes in with concerns for shortness of breath over the past few weeks.  Patient was recently admitted for this and placed on O2 as needed at home.  He went to the [facility] today for shortness of breath and bilateral leg edema and sent here for further evaluation.  Patient was in the 80s and placed on 2 L.  Patient has been compliant with Lasix .  Patient reports continued leg swelling and shortness of breath in the setting of taking his Lasix  60 mg.  He reports already having ultrasounds done and I reviewed the records and patient did have ultrasounds on 12/11 that were negative for DVT.  He denies any new risk factors for DVT.  I reviewed the note from cardiology where patient was seen by Dr. Argentina who recommended admission for inpatient diuresis and management of acute decompensated heart failure.  Physical Exam   Triage Vital Signs: ED Triage Vitals  Encounter Vitals Group     BP 02/01/24 0906 (!) 144/52     Girls Systolic BP Percentile --      Girls Diastolic BP Percentile --      Boys Systolic BP Percentile --      Boys Diastolic BP Percentile --      Pulse Rate 02/01/24 0906 77     Resp 02/01/24 0906 (!) 22     Temp 02/01/24 0906 98.3 F (36.8 C)     Temp src --      SpO2 02/01/24 0905 92 %     Weight --      Height --      Head Circumference --      Peak Flow --      Pain Score 02/01/24 0905 0     Pain Loc --      Pain Education --      Exclude from Growth Chart --     Most recent vital signs: Vitals:   02/01/24 0905 02/01/24 0906  BP:  (!) 144/52  Pulse:  77  Resp:  (!) 22  Temp:  98.3 F (36.8 C)  SpO2: 92% 92%     General: Awake, no distress.  CV:  Good peripheral perfusion.  Resp:  Normal effort.  Abd:  No distention.   Other:  2+ edema bilaterally   ED Results / Procedures / Treatments   Labs (all labs ordered are listed, but only abnormal results are displayed) Labs Reviewed  BASIC METABOLIC PANEL WITH GFR  CBC  PRO BRAIN NATRIURETIC PEPTIDE     EKG  My interpretation of EKG:  Normal sinus rate of 76 without any ST elevation or T wave inversions except for aVL and aVF, V5 and V6  RADIOLOGY I have reviewed the xray personally and interpreted pleural effusions noted bilaterally   PROCEDURES:  Critical Care performed: Yes, see critical care procedure note(s)  .1-3 Lead EKG Interpretation  Performed by: Ernest Ronal BRAVO, MD Authorized by: Ernest Ronal BRAVO, MD     Interpretation: normal     ECG rate:  70   ECG rate assessment: normal     Rhythm: sinus rhythm     Ectopy: none     Conduction: normal   .Critical Care  Performed  by: Ernest Ronal BRAVO, MD Authorized by: Ernest Ronal BRAVO, MD   Critical care provider statement:    Critical care time (minutes):  30   Critical care was necessary to treat or prevent imminent or life-threatening deterioration of the following conditions:  Respiratory failure   Critical care was time spent personally by me on the following activities:  Development of treatment plan with patient or surrogate, discussions with consultants, evaluation of patient's response to treatment, examination of patient, ordering and review of laboratory studies, ordering and review of radiographic studies, ordering and performing treatments and interventions, pulse oximetry, re-evaluation of patient's condition and review of old charts    MEDICATIONS ORDERED IN ED: Medications  furosemide  (LASIX ) injection 80 mg (has no administration in time range)     IMPRESSION / MDM / ASSESSMENT AND PLAN / ED COURSE  I reviewed the triage vital signs and the nursing notes.   Patient's presentation is most consistent with severe exacerbation of chronic illness.   Patient with known CHF  comes in with worsening shortness of breath.  Typically on 2 L of oxygen having to be increased to 4 L of oxygen.  Patient seen by cardiology with concerns for unimproved CHF.  Workup was done to evaluate with x-rays to evaluate for pleural effusions as well as blood work.  I consider the possibility of DVT and discussed doing ultrasounds today but he states that he just had them done a week ago and denies any worsening of his symptoms they are just not improving significantly therefore we will hold off on repeat ultrasounds as this seems more consistent with patient's known CHF.  Patient's BNP is elevated and BMP is reassuring CBC reassuring.  Discussed with the hospitalist for admission due to my concerns for worsening CHF and cardiology wanting patient admitted for IV diuresis.  They did ask me to order a COVID test.  I also did add on troponin although he denies any chest pain I suspect it was slightly elevated from demand but just to ensure this got significantly more elevated than previous to suggest a new cardiac heart attack.  Patient will be admitted to the hospital team  he patient is on the cardiac monitor to evaluate for evidence of arrhythmia and/or significant heart rate changes.      FINAL CLINICAL IMPRESSION(S) / ED DIAGNOSES   Final diagnoses:  Acute on chronic congestive heart failure, unspecified heart failure type (HCC)     Rx / DC Orders   ED Discharge Orders     None        Note:  This document was prepared using Dragon voice recognition software and may include unintentional dictation errors.   Ernest Ronal BRAVO, MD 02/01/24 1137  "

## 2024-02-02 ENCOUNTER — Inpatient Hospital Stay: Admit: 2024-02-02 | Admitting: Nurse Practitioner

## 2024-02-02 ENCOUNTER — Encounter: Payer: Self-pay | Admitting: Hospitalist

## 2024-02-02 ENCOUNTER — Encounter (HOSPITAL_COMMUNITY): Payer: Self-pay

## 2024-02-02 DIAGNOSIS — I5023 Acute on chronic systolic (congestive) heart failure: Secondary | ICD-10-CM | POA: Diagnosis not present

## 2024-02-02 DIAGNOSIS — I110 Hypertensive heart disease with heart failure: Secondary | ICD-10-CM | POA: Diagnosis present

## 2024-02-02 DIAGNOSIS — Z87891 Personal history of nicotine dependence: Secondary | ICD-10-CM | POA: Diagnosis not present

## 2024-02-02 DIAGNOSIS — E785 Hyperlipidemia, unspecified: Secondary | ICD-10-CM

## 2024-02-02 DIAGNOSIS — J9601 Acute respiratory failure with hypoxia: Secondary | ICD-10-CM | POA: Diagnosis present

## 2024-02-02 DIAGNOSIS — I350 Nonrheumatic aortic (valve) stenosis: Secondary | ICD-10-CM | POA: Diagnosis present

## 2024-02-02 DIAGNOSIS — Z7401 Bed confinement status: Secondary | ICD-10-CM | POA: Diagnosis not present

## 2024-02-02 DIAGNOSIS — Y831 Surgical operation with implant of artificial internal device as the cause of abnormal reaction of the patient, or of later complication, without mention of misadventure at the time of the procedure: Secondary | ICD-10-CM | POA: Diagnosis present

## 2024-02-02 DIAGNOSIS — I2510 Atherosclerotic heart disease of native coronary artery without angina pectoris: Secondary | ICD-10-CM | POA: Diagnosis present

## 2024-02-02 DIAGNOSIS — I6523 Occlusion and stenosis of bilateral carotid arteries: Secondary | ICD-10-CM | POA: Diagnosis present

## 2024-02-02 DIAGNOSIS — I10 Essential (primary) hypertension: Secondary | ICD-10-CM | POA: Diagnosis not present

## 2024-02-02 DIAGNOSIS — I272 Pulmonary hypertension, unspecified: Secondary | ICD-10-CM | POA: Diagnosis present

## 2024-02-02 DIAGNOSIS — Z1152 Encounter for screening for COVID-19: Secondary | ICD-10-CM | POA: Diagnosis not present

## 2024-02-02 DIAGNOSIS — Z86718 Personal history of other venous thrombosis and embolism: Secondary | ICD-10-CM | POA: Diagnosis not present

## 2024-02-02 DIAGNOSIS — I5033 Acute on chronic diastolic (congestive) heart failure: Secondary | ICD-10-CM | POA: Diagnosis present

## 2024-02-02 DIAGNOSIS — E876 Hypokalemia: Secondary | ICD-10-CM | POA: Diagnosis present

## 2024-02-02 DIAGNOSIS — I359 Nonrheumatic aortic valve disorder, unspecified: Secondary | ICD-10-CM | POA: Diagnosis not present

## 2024-02-02 DIAGNOSIS — I959 Hypotension, unspecified: Secondary | ICD-10-CM | POA: Diagnosis not present

## 2024-02-02 DIAGNOSIS — T82857A Stenosis of cardiac prosthetic devices, implants and grafts, initial encounter: Secondary | ICD-10-CM | POA: Diagnosis present

## 2024-02-02 DIAGNOSIS — Z86711 Personal history of pulmonary embolism: Secondary | ICD-10-CM | POA: Diagnosis not present

## 2024-02-02 DIAGNOSIS — J810 Acute pulmonary edema: Secondary | ICD-10-CM | POA: Diagnosis not present

## 2024-02-02 DIAGNOSIS — Z952 Presence of prosthetic heart valve: Secondary | ICD-10-CM | POA: Diagnosis not present

## 2024-02-02 DIAGNOSIS — Z951 Presence of aortocoronary bypass graft: Secondary | ICD-10-CM

## 2024-02-02 DIAGNOSIS — J45909 Unspecified asthma, uncomplicated: Secondary | ICD-10-CM | POA: Diagnosis present

## 2024-02-02 DIAGNOSIS — J690 Pneumonitis due to inhalation of food and vomit: Secondary | ICD-10-CM | POA: Diagnosis not present

## 2024-02-02 DIAGNOSIS — G4733 Obstructive sleep apnea (adult) (pediatric): Secondary | ICD-10-CM | POA: Diagnosis present

## 2024-02-02 DIAGNOSIS — Z8673 Personal history of transient ischemic attack (TIA), and cerebral infarction without residual deficits: Secondary | ICD-10-CM | POA: Diagnosis not present

## 2024-02-02 DIAGNOSIS — I2489 Other forms of acute ischemic heart disease: Secondary | ICD-10-CM | POA: Diagnosis present

## 2024-02-02 DIAGNOSIS — Z7982 Long term (current) use of aspirin: Secondary | ICD-10-CM | POA: Diagnosis not present

## 2024-02-02 DIAGNOSIS — I5031 Acute diastolic (congestive) heart failure: Secondary | ICD-10-CM

## 2024-02-02 DIAGNOSIS — Z85828 Personal history of other malignant neoplasm of skin: Secondary | ICD-10-CM | POA: Diagnosis not present

## 2024-02-02 DIAGNOSIS — R410 Disorientation, unspecified: Secondary | ICD-10-CM | POA: Diagnosis not present

## 2024-02-02 DIAGNOSIS — Z79899 Other long term (current) drug therapy: Secondary | ICD-10-CM | POA: Diagnosis not present

## 2024-02-02 DIAGNOSIS — R0602 Shortness of breath: Secondary | ICD-10-CM | POA: Diagnosis present

## 2024-02-02 DIAGNOSIS — Z8249 Family history of ischemic heart disease and other diseases of the circulatory system: Secondary | ICD-10-CM | POA: Diagnosis not present

## 2024-02-02 LAB — COMPREHENSIVE METABOLIC PANEL WITH GFR
ALT: 17 U/L (ref 0–44)
AST: 31 U/L (ref 15–41)
Albumin: 3.7 g/dL (ref 3.5–5.0)
Alkaline Phosphatase: 78 U/L (ref 38–126)
Anion gap: 7 (ref 5–15)
BUN: 27 mg/dL — ABNORMAL HIGH (ref 8–23)
CO2: 33 mmol/L — ABNORMAL HIGH (ref 22–32)
Calcium: 9.4 mg/dL (ref 8.9–10.3)
Chloride: 100 mmol/L (ref 98–111)
Creatinine, Ser: 0.91 mg/dL (ref 0.61–1.24)
GFR, Estimated: >60 mL/min
Glucose, Bld: 87 mg/dL (ref 70–99)
Potassium: 4.1 mmol/L (ref 3.5–5.1)
Sodium: 140 mmol/L (ref 135–145)
Total Bilirubin: 1.4 mg/dL — ABNORMAL HIGH (ref 0.0–1.2)
Total Protein: 6.2 g/dL — ABNORMAL LOW (ref 6.5–8.1)

## 2024-02-02 LAB — CBC
HCT: 39.7 % (ref 39.0–52.0)
Hemoglobin: 13 g/dL (ref 13.0–17.0)
MCH: 32.3 pg (ref 26.0–34.0)
MCHC: 32.7 g/dL (ref 30.0–36.0)
MCV: 98.5 fL (ref 80.0–100.0)
Platelets: 142 K/uL — ABNORMAL LOW (ref 150–400)
RBC: 4.03 MIL/uL — ABNORMAL LOW (ref 4.22–5.81)
RDW: 14.6 % (ref 11.5–15.5)
WBC: 4.9 K/uL (ref 4.0–10.5)
nRBC: 0 % (ref 0.0–0.2)

## 2024-02-02 LAB — ECHOCARDIOGRAM COMPLETE
AV Mean grad: 54 mmHg
AV Peak grad: 81.7 mmHg
Ao pk vel: 4.52 m/s
Area-P 1/2: 4.26 cm2
Height: 66 in
MV M vel: 6.04 m/s
MV Peak grad: 145.7 mmHg
P 1/2 time: 199 ms
S' Lateral: 5.1 cm
Weight: 3344 [oz_av]

## 2024-02-02 LAB — PROTIME-INR
INR: 1.1 (ref 0.8–1.2)
Prothrombin Time: 15.2 s (ref 11.4–15.2)

## 2024-02-02 NOTE — Care Management Obs Status (Signed)
 MEDICARE OBSERVATION STATUS NOTIFICATION   Patient Details  Name: FLYNN GWYN MRN: [id number] Date of Birth: 1941-01-20   Medicare Observation Status Notification Given:  Chaney BRANDY CHRISTIANE LELON, CMA 02/02/2024, 12:13 PM

## 2024-02-02 NOTE — Plan of Care (Signed)
   Problem: Clinical Measurements: Goal: Respiratory complications will improve Outcome: Progressing Goal: Cardiovascular complication will be avoided Outcome: Progressing   Problem: Activity: Goal: Risk for activity intolerance will decrease Outcome: Progressing   Problem: Nutrition: Goal: Adequate nutrition will be maintained Outcome: Progressing

## 2024-02-02 NOTE — Progress Notes (Signed)
"  ° ° °  Informed Consent   Shared Decision Making/Informed Consent The risks, including but not limited to, [bleeding or vascular complications (1 in 500), pneumothorax (1 in 1600), arrhythmia (1 in 1000) and death (1 in 5000)], benefits (diagnostic support and/or management of heart failure, pulmonary hypertension) and alternatives of a right heart catheterization were discussed in detail with Jacob Montes and he is willing to proceed. The risks [stroke (1 in 1000), death (1 in 1000), kidney failure [usually temporary] (1 in 500), bleeding (1 in 200), allergic reaction [possibly serious] (1 in 200)], benefits (diagnostic support and management of coronary artery disease) and alternatives of a left heart cardiac catheterization were discussed in detail with Jacob Montes and he is willing to proceed.     Jacob Montes is tentatively scheduled for the morning of 02/04/2024.  Lonni Meager, NP 02/02/2024, 10:18 AM   "

## 2024-02-02 NOTE — Progress Notes (Signed)
 " PROGRESS NOTE    MASSON NALEPA   FMW:[id number] DOB: 1940/12/14  DOA: 02/01/2024 Date of Service: 02/02/2024 which is hospital day 0  PCP: [name], MD    Hospital course / significant events:   HPI: YUVRAJ PFEIFER is a pleasant 83 y.o. 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  Patient was in the hospital between 01/19/2024 and 02/08/2024 for decompensated heart failure.  Patient was treated with IV antibiotic with some improvement.  Patient was followed up in cardiology clinic on 01/27/2024, he was treated with IV diuretics then, today patient went to cardiology office where he reported not feeling well rather his swelling did not get better and got worse.  He complained of significant shortness of breath, orthopnea, hypoxia and lower extremity edema.  Oxygen saturation at the clinic today was 84% on room air.  Dr. Argentina from cardiology transferred the patient for further diuresis and treatment of congestive heart failure.  Patient denies any fever, chills, nausea, vomiting.  He complains of progressive worsening shortness of breath with leg swelling.  He also complains of some orthopnea denies any PND.  Denies any palpitations.   12/23: Upon arrival to the ED, patient is found to be tachypneic at 22, hypoxic requiring oxygen, room air oxygen saturation was 84%, 92% on 2 L.  Patient was given 80 mg of IV Lasix  with some improvement.  Hospitalist service was consulted for evaluation for admission for acute congestive heart failure exacerbation with hypoxia requiring IV diuresis. 12/24: Per cardiology - initial plan this morning was transfer to [facility] under Dr Delford for structural heart eval/imaging, through the morning this  plan evolved to now getting cardiac cath this Friday 12/26 and likely to [facility] from there      Consultants:  Cardiology - [facility]   Procedures/Surgeries: none      ASSESSMENT & PLAN:    Acute exacerbation of congestive heart failure with hypoxia Last echo EF from 50 to 55%. IV and p.o. Lasix  outpatient failed. Diuresis  Strict I&O, daily weight, salt/fluid restriction Cardiology following - plan transfer to [facility] under Dr Delford for structural heart eval/imaging   Acute hypoxic respiratory failure d/t CHF exacerbation He was saturating 84% on room air. Supplemental O2 as needed   HTN Blood pressure has been stable Continue amlodipine  10 mg daily, metoprolol  25 mg,  Diuresis as above Cardiology following   HLD Continue on rosuvastatin  20 mg   History of asthma Continue Singulair  Will place him on nebulizer as needed   History of CAD s/p CABG, AS s/p TAVR Continue aspirin , metoprolol , rosuvastatin      N/a based on BMI: There is no height or weight on file to calculate BMI.. Significantly low or high BMI is associated with higher medical risk.  Underweight - under 18  overweight - 25 to 29 obese - 30 or more Class 1 obesity: BMI of 30.0 to 34 Class 2 obesity: BMI of 35.0 to 39 Class 3 obesity: BMI of 40.0 to 49 Super Morbid Obesity: BMI 50-59 Super-super Morbid Obesity: BMI 60+ Healthy nutrition and physical activity advised as adjunct to other disease management and risk reduction treatments    DVT prophylaxis: lovenox  IV fluids: no continuous IV fluids  Nutrition: cardiac diet Central  lines / other devices: none  Code Status: FULL CODE ACP documentation reviewed:  none on file in VYNCA  TOC needs: TBD Medical barriers to dispo: pend cardiac cath. Expected medical readiness for transfer to [facility] likely 02/04/24.              Subjective / Brief ROS:  Patient reports no cconerns on rounds Denies CP/SOB.  Pain controlled.  Denies  new weakness.  Tolerating diet.  Reports no concerns w/ urination/defecation.   Family Communication: wife at bedside on rouds    Objective Findings:  Vitals:   02/02/24 1300 02/02/24 1400 02/02/24 1600 02/02/24 1700  BP: (!) 115/53 (!) 116/52 (!) 135/59 (!) 121/53  Pulse: 65 63 78 78  Resp:      Temp:  98.2 F (36.8 C)    TempSrc:      SpO2:    98%    Intake/Output Summary (Last 24 hours) at 02/02/2024 1728 Last data filed at 02/02/2024 1051 Gross per 24 hour  Intake --  Output 300 ml  Net -300 ml   There were no vitals filed for this visit.  Examination:  Physical Exam Constitutional:      General: He is not in acute distress.    Appearance: He is not ill-appearing.  Neurological:     Mental Status: He is alert and oriented to person, place, and time.  Psychiatric:        Mood and Affect: Mood normal.        Behavior: Behavior normal.          Scheduled Medications:   amLODipine   10 mg Oral Daily   aspirin   81 mg Oral Daily   enoxaparin  (LOVENOX ) injection  45 mg Subcutaneous Q24H   furosemide   40 mg Intravenous BID   metoprolol  succinate  25 mg Oral Daily   multivitamin with minerals  1 tablet Oral Daily   pantoprazole   40 mg Oral BID AC   potassium chloride  SA  20 mEq Oral Daily   rosuvastatin   20 mg Oral Daily    Continuous Infusions:   PRN Medications:  acetaminophen  **OR** acetaminophen , montelukast , morphine  injection, ondansetron  **OR** [DISCONTINUED] ondansetron  (ZOFRAN ) IV, oxyCODONE , polyethylene glycol  Antimicrobials from admission:  Anti-infectives (From admission, onward)    None           Data Reviewed:  I have personally reviewed the following...  CBC: Recent Labs  Lab 02/01/24 1042 02/02/24 0819  WBC 5.4 4.9  HGB 12.9* 13.0  HCT 38.6* 39.7  MCV 97.5 98.5  PLT 133* 142*   Basic Metabolic Panel: Recent Labs  Lab 01/28/24 0930 02/01/24 1042 02/02/24 0819  NA 139 139 140  K 4.1 4.6 4.1  CL 102 101 100   CO2 30 31 33*  GLUCOSE 130* 93 87  BUN 14 25* 27*  CREATININE 0.80 0.87 0.91  CALCIUM  9.4 9.3 9.4   GFR: Estimated Creatinine Clearance: 66.3 mL/min (by C-G formula based on SCr of 0.91 mg/dL). Liver Function Tests: Recent Labs  Lab 02/02/24 0819  AST 31  ALT 17  ALKPHOS 78  BILITOT 1.4*  PROT 6.2*  ALBUMIN 3.7   No results for input(s): LIPASE, AMYLASE in the last 168 hours. No results for input(s): AMMONIA in the last 168 hours. Coagulation Profile: Recent Labs  Lab 02/02/24 0819  INR 1.1   Cardiac Enzymes: No results for input(s): CKTOTAL, CKMB, CKMBINDEX, TROPONINI in the last 168 hours. BNP (last 3 results) Recent Labs  01/22/24 0347 01/28/24 0930 02/01/24 1042  PROBNP 1,643.0* 1,299.0* 2,213.0*   HbA1C: No results for input(s): HGBA1C in the last 72 hours. CBG: No results for input(s): GLUCAP in the last 168 hours. Lipid Profile: No results for input(s): CHOL, HDL, LDLCALC, TRIG, CHOLHDL, LDLDIRECT in the last 72 hours. Thyroid  Function Tests: No results for input(s): TSH, T4TOTAL, FREET4, T3FREE, THYROIDAB in the last 72 hours. Anemia Panel: No results for input(s): VITAMINB12, FOLATE, FERRITIN, TIBC, IRON, RETICCTPCT in the last 72 hours. Most Recent Urinalysis On File:     Component Value Date/Time   COLORURINE YELLOW (A) 03/04/2023 1311   APPEARANCEUR CLEAR (A) 03/04/2023 1311   LABSPEC 1.012 03/04/2023 1311   PHURINE 5.0 03/04/2023 1311   GLUCOSEU NEGATIVE 03/04/2023 1311   HGBUR NEGATIVE 03/04/2023 1311   BILIRUBINUR NEGATIVE 03/04/2023 1311   BILIRUBINUR Negative 10/14/2021 1059   KETONESUR NEGATIVE 03/04/2023 1311   PROTEINUR NEGATIVE 03/04/2023 1311   UROBILINOGEN 0.2 10/14/2021 1059   NITRITE NEGATIVE 03/04/2023 1311   LEUKOCYTESUR NEGATIVE 03/04/2023 1311   Sepsis Labs: @LABRCNTIP (procalcitonin:4,lacticidven:4) Microbiology: Recent Results (from the past 240 hours)  Resp panel  by RT-PCR (RSV, Flu A&B, Covid) Anterior Nasal Swab     Status: None   Collection Time: 02/01/24 11:56 AM   Specimen: Anterior Nasal Swab  Result Value Ref Range Status   SARS Coronavirus 2 by RT PCR NEGATIVE NEGATIVE Final    Comment: (NOTE) SARS-CoV-2 target nucleic acids are NOT DETECTED.  The SARS-CoV-2 RNA is generally detectable in upper respiratory specimens during the acute phase of infection. The lowest concentration of SARS-CoV-2 viral copies this assay can detect is 138 copies/mL. A negative result does not preclude SARS-Cov-2 infection and should not be used as the sole basis for treatment or other patient management decisions. A negative result may occur with  improper specimen collection/handling, submission of specimen other than nasopharyngeal swab, presence of viral mutation(s) within the areas targeted by this assay, and inadequate number of viral copies(<138 copies/mL). A negative result must be combined with clinical observations, patient history, and epidemiological information. The expected result is Negative.  Fact Sheet for Patients:  bloggercourse.com  Fact Sheet for Healthcare Providers:  seriousbroker.it  This test is no t yet approved or cleared by the [place]  FDA and  has been authorized for detection and/or diagnosis of SARS-CoV-2 by FDA under an Emergency Use Authorization (EUA). This EUA will remain  in effect (meaning this test can be used) for the duration of the COVID-19 declaration under Section 564(b)(1) of the Act, 21 U.S.C.section 360bbb-3(b)(1), unless the authorization is terminated  or revoked sooner.       Influenza A by PCR NEGATIVE NEGATIVE Final   Influenza B by PCR NEGATIVE NEGATIVE Final    Comment: (NOTE) The Xpert Xpress SARS-CoV-2/FLU/RSV plus assay is intended as an aid in the diagnosis of influenza from Nasopharyngeal swab specimens and should not be used as a sole  basis for treatment. Nasal washings and aspirates are unacceptable for Xpert Xpress SARS-CoV-2/FLU/RSV testing.  Fact Sheet for Patients: bloggercourse.com  Fact Sheet for Healthcare Providers: seriousbroker.it  This test is not yet approved or cleared by the [place]  FDA and has been authorized for detection and/or diagnosis of SARS-CoV-2 by FDA under an Emergency Use Authorization (EUA). This EUA will remain in effect (meaning this test can be used) for the duration of the COVID-19 declaration under Section 564(b)(1) of the Act, 21 U.S.C. section 360bbb-3(b)(1), unless the authorization is terminated or revoked.  Resp Syncytial Virus by PCR NEGATIVE NEGATIVE Final    Comment: (NOTE) Fact Sheet for Patients: bloggercourse.com  Fact Sheet for Healthcare Providers: seriousbroker.it  This test is not yet approved or cleared by the [place]  FDA and has been authorized for detection and/or diagnosis of SARS-CoV-2 by FDA under an Emergency Use Authorization (EUA). This EUA will remain in effect (meaning this test can be used) for the duration of the COVID-19 declaration under Section 564(b)(1) of the Act, 21 U.S.C. section 360bbb-3(b)(1), unless the authorization is terminated or revoked.  Performed at [facility], [address], [place], [place] [address]       Radiology Studies last 3 days: ECHOCARDIOGRAM COMPLETE Result Date: 02/02/2024    ECHOCARDIOGRAM REPORT   Patient Name:   DEFORREST BOGLE Date of Exam: 02/01/2024 Medical Rec #:  [id number]     Height:       66.0 in Accession #:    [id number]    Weight:       209.0 lb Date of Birth:  1940-06-13     BSA:          2.038 m Patient Age:    83 years      BP:           112/42 mmHg Patient Gender: M             HR:           77 bpm. Exam Location:  [facility] Procedure: 2D Echo, Cardiac Doppler, Color Doppler and  Intracardiac            Opacification Agent (Both Spectral and Color Flow Doppler were            utilized during procedure). Indications:     I50.31 Acute Diastolic Heart Failure  History:         Patient has prior history of Echocardiogram examinations, most                  recent 11/11/2023. CAD, TAVR. Left Carotid Endarterectomy. and                  Prior CABG, Stroke; Risk Factors:Dyslipidemia, Hypertension,                  Sleep Apnea and Former Smoker. Pulmonary embolism.                  Aortic Valve: 23 mm Sapien prosthetic, stented (TAVR) valve is                  present in the aortic position. Procedure Date: 01/19/2017.  Sonographer:     Carl Coma RDCS Referring Phys:  [id number] [facility] PAUDEL Diagnosing Phys: Caron Poser IMPRESSIONS  1. Left ventricular ejection fraction, by estimation, is 50 to 55%. The left ventricle has low normal function. The left ventricle has no regional wall motion abnormalities. The left ventricular internal cavity size was mildly dilated. Left ventricular diastolic parameters are consistent with Grade II diastolic dysfunction (pseudonormalization).  2. Right ventricular systolic function was not well visualized. The right ventricular size is not well visualized.  3. Left atrial size was moderately dilated.  4. Right atrial size was mildly dilated.  5. The mitral valve is degenerative. Mild mitral valve regurgitation. Mild calcific mitral stenosis. Severe mitral annular calcification.  6. There is a 23 mm Sapien prosthetic (TAVR) valve present in the aortic position. Procedure Date: 01/19/2017. Echo findings are consistent with severe stenosis and moderate  perivalvular leak of the aortic prosthesis. Aortic valve mean gradient measures  54.0 mmHg. Aortic valve Vmax measures 4.52 m/s. DVI 0.13.  7. The inferior vena cava is dilated in size with >50% respiratory variability, suggesting right atrial pressure of 8 mmHg. Comparison(s): A prior study was performed on  11/11/2023. TAVR indices now meet threshold for severe stenosis. Vmax previously 3.64, MG 32 mmHg, DVI 0.26. Recommend valve team consult. FINDINGS  Left Ventricle: Left ventricular ejection fraction, by estimation, is 50 to 55%. The left ventricle has low normal function. The left ventricle has no regional wall motion abnormalities. Definity  contrast agent was given IV to delineate the left ventricular endocardial borders. The left ventricular internal cavity size was mildly dilated. There is no left ventricular hypertrophy. Left ventricular diastolic parameters are consistent with Grade II diastolic dysfunction (pseudonormalization). Right Ventricle: The right ventricular size is not well visualized. Right vetricular wall thickness was not well visualized. Right ventricular systolic function was not well visualized. Left Atrium: Left atrial size was moderately dilated. Right Atrium: Right atrial size was mildly dilated. Pericardium: There is no evidence of pericardial effusion. Mitral Valve: The mitral valve is degenerative in appearance. There is severe calcification of the mitral valve leaflet(s). Moderate mitral annular calcification. Mild mitral valve regurgitation. Mild mitral valve stenosis. Tricuspid Valve: The tricuspid valve is not well visualized. Tricuspid valve regurgitation is not demonstrated. No evidence of tricuspid stenosis. There is a 23 mm Sapien prosthetic, stented (TAVR) valve present in the aortic position. Procedure Date: 01/19/2017. Pulmonic Valve: The pulmonic valve was not well visualized. Pulmonic valve regurgitation is not visualized. No evidence of pulmonic stenosis. Aorta: The aortic root is normal in size and structure. Venous: The inferior vena cava is dilated in size with greater than 50% respiratory variability, suggesting right atrial pressure of 8 mmHg. IAS/Shunts: The interatrial septum was not well visualized.  LEFT VENTRICLE PLAX 2D LVIDd:         6.30 cm Diastology LVIDs:          5.10 cm LV e' medial:    4.57 cm/s LV PW:         1.20 cm LV E/e' medial:  30.4 LV IVS:        0.70 cm LV e' lateral:   4.86 cm/s                        LV E/e' lateral: 28.6  RIGHT VENTRICLE             IVC RV Basal diam:  5.20 cm     IVC diam: 2.10 cm RV S prime:     10.09 cm/s TAPSE (M-mode): 2.2 cm LEFT ATRIUM             Index        RIGHT ATRIUM           Index LA diam:        5.80 cm 2.85 cm/m   RA Area:     18.30 cm LA Vol (A2C):   99.2 ml 48.69 ml/m  RA Volume:   53.50 ml  26.26 ml/m LA Vol (A4C):   79.4 ml 38.97 ml/m LA Biplane Vol: 90.8 ml 44.56 ml/m  AORTIC VALVE AV Vmax:           452.00 cm/s AV Vmean:          331.600 cm/s AV VTI:            0.942 m  AV Peak Grad:      81.7 mmHg AV Mean Grad:      54.0 mmHg LVOT Vmax:         56.93 cm/s LVOT Vmean:        38.600 cm/s LVOT VTI:          0.108 m LVOT/AV VTI ratio: 0.12 AI PHT:            199 msec  AORTA Ao Root diam: 2.90 cm MITRAL VALVE MV Area (PHT): 4.26 cm     SHUNTS MV Decel Time: 178 msec     Systemic VTI: 0.11 m MR Peak grad: 145.7 mmHg MR Mean grad: 85.5 mmHg MR Vmax:      603.50 cm/s MR Vmean:     430.0 cm/s MV E velocity: 139.00 cm/s MV A velocity: 73.85 cm/s MV E/A ratio:  1.88 Caron Poser Electronically signed by Caron Poser Signature Date/Time: 02/02/2024/9:07:08 AM    Final    DG Chest 2 View Result Date: 02/01/2024 EXAM: 2 VIEW(S) XRAY OF THE CHEST 02/01/2024 09:25:00 AM COMPARISON: 01/19/2024 CLINICAL HISTORY: 83 year old male with persistent shortness of breath for 1 month. FINDINGS: LUNGS AND PLEURA: Vascular congestion/mild interstitial pulmonary edema. Small to moderate bilateral pleural effusions. Bibasilar airspace opacities. No pneumothorax. No significant change since 01/19/2024. HEART AND MEDIASTINUM: Cardiomegaly. Prosthetic aortic valve. BONES AND SOFT TISSUES: Prior median sternotomy. No acute osseous abnormality. IMPRESSION: 1. Pulmonary vascular congestion with bilateral pleural effusions not  significantly changed since December/11/2023 2. No new cardiopulmonary abnormality. Electronically signed by: Helayne Hurst MD 02/01/2024 10:44 AM EST RP Workstation: [id number]        Laneta Blunt, DO [facility] 02/02/2024, 5:28 PM    Dictation software may have been used to generate the above note. Typos may occur and escape review in typed/dictated notes. Please contact Dr Blunt directly for clarity if needed.  Staff may message me via secure chat in Epic  but this may not receive an immediate response,  please page me for urgent matters!  If 7PM-7AM, please contact night coverage [email or web address]       "

## 2024-02-02 NOTE — ED Notes (Addendum)
 Pt alert, NAD, calm, interactive, resps e/u at rest, speaking in clear short phrases, sitting upright in recliner. Speaking with cards  PA provider. Wife present.

## 2024-02-02 NOTE — Hospital Course (Addendum)
 Hospital course / significant events:   HPI: Jacob Montes is a pleasant 83 y.o. 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  Patient was in the hospital between 01/19/2024 and 02/08/2024 for decompensated heart failure.  Patient was treated with IV antibiotic with some improvement.  Patient was followed up in cardiology clinic on 01/27/2024, he was treated with IV diuretics then, today patient went to cardiology office where he reported not feeling well rather his swelling did not get better and got worse.  He complained of significant shortness of breath, orthopnea, hypoxia and lower extremity edema.  Oxygen saturation at the clinic today was 84% on room air.  Dr. Argentina from cardiology transferred the patient for further diuresis and treatment of congestive heart failure.  Patient denies any fever, chills, nausea, vomiting.  He complains of progressive worsening shortness of breath with leg swelling.  He also complains of some orthopnea denies any PND.  Denies any palpitations.   12/23: Upon arrival to the ED, patient is found to be tachypneic at 22, hypoxic requiring oxygen, room air oxygen saturation was 84%, 92% on 2 L.  Patient was given 80 mg of IV Lasix  with some improvement.  Hospitalist service was consulted for evaluation for admission for acute congestive heart failure exacerbation with hypoxia requiring IV diuresis. 12/24: Per cardiology - initial plan this morning was transfer to [facility] under Dr Delford for structural heart eval/imaging, through the morning this plan evolved to now getting cardiac cath this Friday 12/26 and likely to [facility] from there  12/25: pt reports worsening productive cough, CXR does look like some pleural effusion  probably mild/moderate, repeated Flu/COVID as we have seen multiple flu cases, this was negative. Per cardiology - increased lasix  12/26: cardiac cath today - progressive aortic stenosis question leaflet thrombosis, started heparin  gtt. Plan for to [facility] on Sunday 12/28 12/27: continues to diurese      Consultants:  Cardiology - [facility]   Procedures/Surgeries: none      ASSESSMENT & PLAN:    Acute exacerbation of congestive heart failure with hypoxia Last echo EF from 50 to 55%. IV and p.o. Lasix  outpatient failed. Diuresis  Strict I&O, daily weight, salt/fluid restriction Cardiology following - plan transfer to [facility] 12/28 for structural heart eval/imaging   History of CAD s/p CABG, AS s/p TAVR Continue aspirin , metoprolol , rosuvastatin   Acute hypoxic respiratory failure d/t CHF exacerbation and  pleural effusion  He was saturating 84% on room air. Worsening cough today  Supplemental O2 as needed  Lasix  per cardiology  Check COVID/Flu --> negative   HTN Blood pressure has been stable Continue amlodipine  10 mg daily, metoprolol  25 mg,  Diuresis as above Cardiology following   HLD Continue on rosuvastatin  20 mg   History of asthma Wheezing on exam  Continue Singulair  Will place him on DuoNeb and Pulmicort  nebs scheduled        N/a based on BMI: There is no height or weight on file to calculate BMI.. Significantly low or high BMI is associated with higher medical risk.  Underweight - under 18  overweight - 25 to 29 obese - 30 or more Class 1 obesity: BMI of 30.0 to 34 Class 2 obesity: BMI of 35.0 to 39 Class 3 obesity: BMI  of 40.0 to 49 Super Morbid Obesity: BMI 50-59 Super-super Morbid Obesity: BMI 60+ Healthy nutrition and physical activity advised as adjunct to other disease management and risk reduction treatments    DVT prophylaxis: lovenox  IV fluids: no continuous IV fluids  Nutrition: cardiac diet Central lines / other devices: none  Code  Status: FULL CODE ACP documentation reviewed:  none on file in VYNCA  TOC needs: TBD Medical barriers to dispo: diuresin thru weekend. Expected medical readiness for transfer to [facility] likely 02/06/24.

## 2024-02-02 NOTE — Progress Notes (Signed)
" °  HEART AND VASCULAR CENTER   MULTIDISCIPLINARY HEART VALVE TEAM    Structural heart team consulted about [name] who is currently admitted for second CHF admission this month. He has a history of CABG in 2009 and TAVR with a 23 mm Edwards S3 in [place] (01/2017) _ valve card uploaded to Media. Per review of echos mean gradient was ~16 mmhg in 2020 and has progressively increased to 23 mm hg (2022) --> 32 mm hg and mild PVL (11/2023).   Echo 02/01/24 showed EF 50-55%, mild LV dilation, severe MAC with mild MR/MS and severe bioprosthetic TAVR valve stenosis with a mean gradient of 54 mm hg and mod PVL.   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 Plan to  diurese him at [facility], proceed with [facility] on Friday and then transfer to [facility] with an accepting MD, Dr Jana and place on Team C.   Dr Wendel with team C will see him in structural heart consultation Monday and we will get TAVR CT scans and make a plan for possible VIV TAVR.    Jacob Hummer PA-C  [facility]     "

## 2024-02-02 NOTE — Plan of Care (Signed)
   Problem: Education: Goal: Knowledge of General Education information will improve Description Including pain rating scale, medication(s)/side effects and non-pharmacologic comfort measures Outcome: Progressing

## 2024-02-02 NOTE — Consult Note (Addendum)
 "  Cardiology Consultation:   Patient ID: [id number] HECKMANN; [id number]; 19-Jun-1940   Admit date: 02/01/2024 Date of Consult: 02/02/2024  Primary Care Provider: [name], MD Primary Cardiologist: Perla Primary Electrophysiologist:  None   Patient Profile:   Jacob Montes is a 83 y.o. male with a hx of CAD status post three-vessel CABG in 07/2007, severe aortic stenosis status post TAVR in 01/2017 in [place], [place], carotid artery stenosis status post left CEA in 2009 complicated by right laryngeal nerve paralysis, DVT/PE in 2012, HTN, HLD intolerant to statins, OSA not on CPAP, prior tobacco use, and obesity who is being seen today for the evaluation of acute decompensated heart failure at the request of Dr. Roann.  History of Present Illness:   Jacob Montes was seen in the office in 09/2023 for routine follow-up.  Subsequent echo in 11/2023 showed an EF of 50 to 55%, grade 2 diastolic dysfunction, normal RV systolic function and ventricular cavity size, mild to moderate mitral regurgitation with severe mitral annular calcification, and moderate stenosis of TAVR with a mean gradient of 32 mmHg and a valve area of 0.84 cm.  Subsequent Lexiscan  MPI in 12/2023 showed no significant ischemia with an EF of 50 to 55% and was overall low risk.   He was admitted to the hospital from 12/10 through 01/22/2024 with decompensated heart failure treated with IV diuretics with some improvement.  During that admission he underwent CT of the chest which was negative for PE.  He was discharged home with supplemental oxygen.  He followed up in our office and in the [facility] CHF clinic, requiring IV diuresis on 12/18.  He was seen in our office on 02/01/2024 continuing to note significant orthopnea lower extremity swelling, and hypoxia.  Wife reports the patient noted significant exertional dyspnea with minimal ambulation.  Oxygen saturation 84% in our office prompting ER evaluation.  Upon arrival to [facility] ED  blood pressure was 144/52, heart rate 77 bpm, respiratory rate 22, afebrile, and was requiring supplemental oxygen at 2 L subsequently titrated to 6 L in the ED.  Chest x-ray showed pulmonary vascular congestion with bilateral pleural effusions similar to study from 01/19/2024.  Labs notable for BNP 2213, increased from 1299 four days prior, high-sensitivity troponin 30, Hgb 12.9, WBC 5.4, PLT 133, potassium 4.6, BUN 25, serum creatinine 0.87.  In the ED he received IV Lasix  80 mg and KCl repletion.  At time of cardiology consult this morning, he reports some improvement in his dyspnea with supplemental oxygen requirement decreased to 2 L.  He continues to note exertional shortness of breath with ambulation in the room.  Reports vigorous urine output with IV Lasix .  Has had some mild chest discomfort in the setting of the above.  No dizziness, presyncope, or syncope.  Continues to note lower extremity swelling.  Reports a weight increase of at least 7 pounds following initial hospital discharge earlier this month to 12/23.  Documented urine output 900 mL in the ER.  Echo pending.  Labs this morning are pending.  Our team has reached out to structural heart with recommendation for patient to be transferred to [facility] for further evaluation of aortic prosthesis stenosis.    Past Medical History:  Diagnosis Date   Actinic keratosis    Acute exacerbation of CHF (congestive heart failure) (HCC) 01/19/2024   Aortic stenosis    a. s/p TAVR 01/2017; b. 11/2023 Echo: EF 50-55%, GrII DD, nl RV fxn, mild-mod MR, mild Ca2+ MS,  Mod AS, AVA 0.84, mean gradient 32 mmHg, mild perivalvular leak.   CAD (coronary artery disease)    a. s/p 3-v CABG in 07/2007   Cancer [facility])    Skin   Carotid artery disease    a. 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   GERD (gastroesophageal reflux disease)    HLD  (hyperlipidemia)    Hyperlipidemia 10/15/2015   Hypertension    Nephrolithiasis 11/16/2018   Pulmonary embolism (HCC) 08/2010   Skin cancer    Sleep apnea    Stroke [facility])     Past Surgical History:  Procedure Laterality Date   CARDIAC CATHETERIZATION     CARDIAC VALVE REPLACEMENT  01-19-17   Carotid surgery Left 03/08/2007   CHOLECYSTECTOMY  02/16/2018   COLONOSCOPY WITH PROPOFOL  N/A 09/01/2022   Procedure: COLONOSCOPY WITH PROPOFOL ;  Surgeon: Maryruth Ole DASEN, MD;  Location: [facility] ENDOSCOPY;  Service: Endoscopy;  Laterality: N/A;   CORONARY ARTERY BYPASS GRAFT     ENDOSCOPIC RETROGRADE CHOLANGIOPANCREATOGRAPHY (ERCP) WITH PROPOFOL  N/A 07/10/2021   Procedure: ENDOSCOPIC RETROGRADE CHOLANGIOPANCREATOGRAPHY (ERCP) WITH PROPOFOL ;  Surgeon: Jinny Carmine, MD;  Location: [facility] ENDOSCOPY;  Service: Endoscopy;  Laterality: N/A;   ERCP N/A 11/18/2018   Procedure: ENDOSCOPIC RETROGRADE CHOLANGIOPANCREATOGRAPHY (ERCP);  Surgeon: Jinny Carmine, MD;  Location: [facility] ENDOSCOPY;  Service: Endoscopy;  Laterality: N/A;   EUS N/A 08/21/2021   Procedure: UPPER ENDOSCOPIC ULTRASOUND (EUS) LINEAR;  Surgeon: Queenie Asberry LABOR, MD;  Location: [facility] ENDOSCOPY;  Service: Gastroenterology;  Laterality: N/A;  [facility]   EYE SURGERY  04-15-99   09-14-99   Heart Bypass N/A 07/2007   HERNIA REPAIR  03-12-88   Not sure of date   left shoulder surgery Left 06/03/2009   POLYPECTOMY  09/01/2022   Procedure: POLYPECTOMY;  Surgeon: Maryruth Ole DASEN, MD;  Location: [facility] ENDOSCOPY;  Service: Endoscopy;;   RIGHT AND LEFT HEART CATH  07/18/2007   right shoulder surgery  2003   TEE WITHOUT CARDIOVERSION N/A 11/21/2018   Procedure: TRANSESOPHAGEAL ECHOCARDIOGRAM (TEE);  Surgeon: Darron Deatrice LABOR, MD;  Location: [facility] ORS;  Service: Cardiovascular;  Laterality: N/A;   TVAR       Home Meds: Prior to Admission medications  Medication Sig Start Date End Date Taking? Authorizing Provider  amLODipine  (NORVASC ) 10 MG tablet  Take 1 tablet by mouth once daily 06/04/21  Yes [name], MD  aspirin  81 MG chewable tablet Chew 81 mg by mouth daily.   Yes [provider]  docusate sodium  (COLACE) 250 MG capsule Take 250 mg by mouth daily as needed for constipation.    Yes [provider]  fluticasone (FLONASE) 50 MCG/ACT nasal spray Place 2 sprays into both nostrils daily as needed for allergies or rhinitis. 11/05/23 11/04/24 Yes [provider]  furosemide  (LASIX ) 40 MG tablet Take 1.5 tablets (60 mg total) by mouth daily. 01/25/24  Yes Furth, Cadence H, PA-C  metoprolol  succinate (TOPROL -XL) 25 MG 24 hr tablet Take 1 tablet (25 mg total) by mouth daily. Take with or immediately following a meal. 01/03/24 12/28/24 Yes Gollan, Timothy J, MD  montelukast  (SINGULAIR ) 10 MG tablet Take 1 tablet (10 mg total) by mouth at bedtime as needed (allergies). 05/22/19  Yes [name], MD  ondansetron  (ZOFRAN -ODT) 4 MG disintegrating tablet Take 1 tablet (4 mg total) by mouth every 8 (eight) hours as needed for nausea or vomiting. 05/20/22  Yes Dorothyann Drivers, MD  potassium chloride  20 MEQ TBCR Take 1 tablet (20 mEq total) by mouth daily. Take with lasix  01/25/24  Yes Furth, Cadence H, PA-C  potassium chloride  SA (KLOR-CON  M) 20 MEQ tablet Take 20 mEq by mouth daily. 01/25/24  Yes [provider]  rosuvastatin  (CRESTOR ) 20 MG tablet Take 1 tablet (20 mg total) by mouth daily. 12/17/21  Yes Simmons-Robinson, Makiera, MD  cetirizine (ZYRTEC) 10 MG tablet Take 10 mg by mouth daily. 11/05/23   [provider]  Lactobacillus (PROBIOTIC ACIDOPHILUS PO) Take by mouth daily.    [provider]  Multiple Vitamin (MULTIVITAMIN) tablet Take 1 tablet by mouth daily.    [provider]  omeprazole (PRILOSEC) 20 MG capsule Take 20 mg by mouth daily. As needed    [provider]  tamsulosin  (FLOMAX ) 0.4 MG CAPS capsule Take 1 capsule by mouth once daily Patient not  taking: No sig reported 12/18/20   [name], MD    Inpatient Medications: Scheduled Meds:  amLODipine   10 mg Oral Daily   aspirin   81 mg Oral Daily   enoxaparin  (LOVENOX ) injection  45 mg Subcutaneous Q24H   furosemide   40 mg Intravenous BID   metoprolol  succinate  25 mg Oral Daily   multivitamin with minerals  1 tablet Oral Daily   pantoprazole   40 mg Oral BID AC   potassium chloride  SA  20 mEq Oral Daily   rosuvastatin   20 mg Oral Daily   Continuous Infusions:  PRN Meds: acetaminophen  **OR** acetaminophen , montelukast , morphine  injection, ondansetron  **OR** [DISCONTINUED] ondansetron  (ZOFRAN ) IV, oxyCODONE , polyethylene glycol  Allergies:  Allergies[1]  Social History:   Social History   Socioeconomic History   Marital status: Married    Spouse name: Not on file   Number of children: 2   Years of education: Not on file   Highest education level: Some college, no degree  Occupational History   Occupation: retired  Tobacco Use   Smoking status: Former    Current packs/day: 0.00    Types: Cigarettes    Quit date: 1979    Years since quitting: 47.0   Smokeless tobacco: Never   Tobacco comments:    Quit in 1979  Vaping Use   Vaping status: Never Used  Substance and Sexual Activity   Alcohol use: Never   Drug use: Never   Sexual activity: Yes    Partners: Female  Other Topics Concern   Not on file  Social History Narrative   Not on file   Social Drivers of Health   Tobacco Use: Medium Risk (02/01/2024)   Patient History    Smoking Tobacco Use: Former    Smokeless Tobacco Use: Never    Passive Exposure: Not on Actuary Strain: Low Risk  (12/02/2023)   Received from [facility]   Overall Financial Resource Strain (CARDIA)    Difficulty of Paying Living Expenses: Not hard at all  Food Insecurity: No Food Insecurity (01/21/2024)   Epic    Worried About Radiation Protection Practitioner of Food in the Last Year: Never true    Ran Out of  Food in the Last Year: Never true  Transportation Needs: No Transportation Needs (01/21/2024)   Epic    Lack of Transportation (Medical): No    Lack of Transportation (Non-Medical): No  Physical Activity: Inactive (12/02/2023)   Received from [facility]   Exercise Vital Sign    On average, how many days per  week do you engage in moderate to strenuous exercise (like a brisk walk)?: 0 days    On average, how many minutes do you engage in exercise at this level?: 0 min  Stress: No Stress Concern Present (11/05/2023)   Received from [facility] - Occupational Stress Questionnaire    Feeling of Stress : Not at all  Social Connections: Socially Integrated (01/21/2024)   Social Connection and Isolation Panel    Frequency of Communication with Friends and Family: More than three times a week    Frequency of Social Gatherings with Friends and Family: More than three times a week    Attends Religious Services: More than 4 times per year    Active Member of Clubs or Organizations: Yes    Attends Banker Meetings: More than 4 times per year    Marital Status: Married  Catering Manager Violence: Not At Risk (01/21/2024)   Epic    Fear of Current or Ex-Partner: No    Emotionally Abused: No    Physically Abused: No    Sexually Abused: No  Depression (PHQ2-9): Low Risk (10/23/2021)   Depression (PHQ2-9)    PHQ-2 Score: 0  Alcohol Screen: Low Risk (07/03/2021)   Alcohol Screen    Last Alcohol Screening Score (AUDIT): 0  Housing: Low Risk (01/21/2024)   Epic    Unable to Pay for Housing in the Last Year: No    Number of Times Moved in the Last Year: 0    Homeless in the Last Year: No  Utilities: Not At Risk (01/21/2024)   Epic    Threatened with loss of utilities: No  Health Literacy: Adequate Health Literacy (11/05/2023)   Received from [facility]   [phone] Health Literacy    How  often do you need to have someone help you when you read instructions, pamphlets, or other written material from your doctor or pharmacy?: Never     Family History:   Family History  Problem Relation Age of Onset   Hypertension Mother    Stroke Mother    Lung cancer Father    Alcohol abuse Father    Non-Hodgkin's lymphoma Brother    Varicose Veins Brother     ROS:  Review of Systems  Constitutional:  Positive for malaise/fatigue. Negative for chills, diaphoresis, fever and weight loss.  HENT:  Negative for congestion.   Eyes:  Negative for discharge and redness.  Respiratory:  Positive for sputum production. Negative for cough, shortness of breath and wheezing.   Cardiovascular:  Positive for chest pain, orthopnea and leg swelling. Negative for palpitations, claudication and PND.  Gastrointestinal:  Negative for abdominal pain, heartburn, nausea and vomiting.  Musculoskeletal:  Negative for falls and myalgias.  Skin:  Negative for rash.  Neurological:  Positive for weakness. Negative for dizziness, tingling, tremors, sensory change, speech change, focal weakness and loss of consciousness.  Endo/Heme/Allergies:  Does not bruise/bleed easily.  Psychiatric/Behavioral:  Negative for substance abuse. The patient is not nervous/anxious.   All other systems reviewed and are negative.     Physical Exam/Data:   Vitals:   02/02/24 0700 02/02/24 0730 02/02/24 0800 02/02/24 0815  BP: (!) 112/44 (!) 112/45 (!) 116/49 (!) 112/44  Pulse: 65 (!) 58 62 64  Resp:    (!) 22  Temp:      TempSrc:      SpO2:        Intake/Output Summary (Last  24 hours) at 02/02/2024 0817 Last data filed at 02/01/2024 1625 Gross per 24 hour  Intake --  Output 900 ml  Net -900 ml   There were no vitals filed for this visit. There is no height or weight on file to calculate BMI.   Physical Exam: General: Well developed, well nourished, in no acute distress. Head: Normocephalic, atraumatic, sclera  non-icteric, no xanthomas, nares without discharge.  Neck: Negative for carotid bruits. JVD difficult to assess secondary to body habitus. Lungs: Diminished breath sounds along the bilateral bases. Breathing is unlabored.  Supplemental oxygen via nasal cannula. Heart: RRR with S1 S2. II/VI systolic murmur RUSB, no rubs, or gallops appreciated. Abdomen: Soft, non-tender, non-distended with normoactive bowel sounds. No hepatomegaly. No rebound/guarding. No obvious abdominal masses. Msk:  Strength and tone appear normal for age. Extremities: No clubbing or cyanosis.  Mild bilateral lower extremity edema.  Neuro: Alert and oriented X 3. No facial asymmetry. No focal deficit. Moves all extremities spontaneously. Psych:  Responds to questions appropriately with a normal affect.   EKG:  The EKG was personally reviewed and demonstrates: NSR, 76 bpm, right axis deviation, nonspecific IVCD with diffuse ST-T changes Telemetry:  Telemetry was personally reviewed and demonstrates: Sinus rhythm with occasional PACs, rare ventricular couplets, occasional PACs, and short atrial run  Weights: There were no vitals filed for this visit.  Relevant CV Studies:  2D echo 02/01/2024: 1. Left ventricular ejection fraction, by estimation, is 50 to 55%. The  left ventricle has low normal function. The left ventricle has no regional  wall motion abnormalities. The left ventricular internal cavity size was  mildly dilated. Left ventricular  diastolic parameters are consistent with Grade II diastolic dysfunction  (pseudonormalization).   2. Right ventricular systolic function was not well visualized. The right  ventricular size is not well visualized.   3. Left atrial size was moderately dilated.   4. Right atrial size was mildly dilated.   5. The mitral valve is degenerative. Mild mitral valve regurgitation.  Mild calcific mitral stenosis. Severe mitral annular calcification.   6. There is a 23 mm Sapien  prosthetic (TAVR) valve present in the aortic  position. Procedure Date: 01/19/2017. Echo findings are consistent with  severe stenosis and moderate perivalvular leak of the aortic prosthesis.  Aortic valve mean gradient measures   54.0 mmHg. Aortic valve Vmax measures 4.52 m/s. DVI 0.13.   7. The inferior vena cava is dilated in size with >50% respiratory  variability, suggesting right atrial pressure of 8 mmHg.  __________  Lexiscan  MPI 01/05/2024: Pharmacological myocardial perfusion imaging study with no significant  ischemia Small region fixed defect distal inferolateral wall, unable to exclude prior MI No focal wall motion abnormality, EF estimated at 30% (poor estimation in the setting of GI and attenuation artifact) No EKG changes concerning for ischemia at peak stress or in recovery. Low risk scan Recent echo with low normal ejection fraction 50 to 55% ___________  2D echo 11/11/2023:   1. Technically difficult study.   2. Left ventricular ejection fraction, by estimation, is 50 to 55%. The  left ventricle has low normal function. Left ventricular endocardial  border not optimally defined to evaluate regional wall motion. The left  ventricular internal cavity size was  mildly dilated. Left ventricular diastolic parameters are consistent with  Grade II diastolic dysfunction (pseudonormalization). The average left  ventricular global longitudinal strain is -14.8 %. The global longitudinal  strain is abnormal.   3. Right ventricular systolic function is  normal. The right ventricular  size is normal.   4. The mitral valve is abnormal. Mild to moderate mitral valve  regurgitation. Mild calcific mitral stenosis. Severe mitral annular  calcification.   5. TAVR present. Moderate stenosis. Vmax 3.64 m/s, DVI 0.26, MG 32 mmHg  with AVA 0.84. Mild perivalvular leak of the aortic prosthesis.   Comparison(s): A prior study was performed on 07/17/2021. TAVR Vmax  previously 3.32 m/s  with MG , AVA 0.99, and DVI 0.34. Velocities and  gradients measure higher on today's exam.    Laboratory Data:  Chemistry Recent Labs  Lab 01/28/24 0930 02/01/24 1042  NA 139 139  K 4.1 4.6  CL 102 101  CO2 30 31  GLUCOSE 130* 93  BUN 14 25*  CREATININE 0.80 0.87  CALCIUM  9.4 9.3  GFRNONAA >60 >60  ANIONGAP 7 8    No results for input(s): PROT, ALBUMIN, AST, ALT, ALKPHOS, BILITOT in the last 168 hours. Hematology Recent Labs  Lab 02/01/24 1042  WBC 5.4  RBC 3.96*  HGB 12.9*  HCT 38.6*  MCV 97.5  MCH 32.6  MCHC 33.4  RDW 14.6  PLT 133*   Cardiac EnzymesNo results for input(s): TROPONINI in the last 168 hours. No results for input(s): TROPIPOC in the last 168 hours.  BNP Recent Labs  Lab 01/28/24 0930 02/01/24 1042  PROBNP 1,299.0* 2,213.0*    DDimer No results for input(s): DDIMER in the last 168 hours.  Radiology/Studies:  DG Chest 2 View Result Date: 02/01/2024 IMPRESSION: 1. Pulmonary vascular congestion with bilateral pleural effusions not significantly changed since December/11/2023 2. No new cardiopulmonary abnormality. Electronically signed by: Helayne Hurst MD 02/01/2024 10:44 AM EST RP Workstation: [id number]    Assessment and Plan:   1. Acute hypoxic respiratory failure secondary to acute on chronic decompensated HFpEF:  - Remains volume up - Exacerbation likely multifactorial including progressive aortic prosthetic valve stenosis, obesity, and untreated sleep apnea  - Continue IV diuresis with IV Lasix  40 mg bid with KCl repletion   - Echo with low normal LV systolic function - Escalate GDMT as able  - Strict I's and O's, daily standing scale weights   2. 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 CAD status post CABG with elevated high-sensitivity troponin:  - High-sensitivity troponin flat trending and not consistent with ACS, likely secondary to supply/demand ischemia in the setting of volume overload with progressive moderate to severe prosthetic aortic valve stenosis  - Defer initiation of heparin  drip without dynamic elevation of troponin  - Echo with low normal LV systolic function and normal wall motion - ASA 81 mg, rosuvastatin  20 mg   4. HTN:  - Blood pressure well-controlled - Continue amlodipine  10 mg, Toprol  XL 25 mg  5. HLD with statin intolerance:  - LDL 76 in 11/2023  - Tolerating PTA rosuvastatin  20 mg   6.  Carotid  artery stenosis: - Status post left CEA in 2009 complicated by right laryngeal nerve paralysis - Carotid artery ultrasound in 02/2023 showed no evidence of hemodynamically significant stenosis in the bilateral ICAs - ASA and rosuvastatin   7.  Obesity with OSA: - No longer using CPAP - Recommend outpatient referral to sleep medicine      For questions or updates, please contact [facility] Please consult [email or web address] for contact info under Cardiology/STEMI.   Signed, Bernardino Bring, PA-C [facility] Pager: [phone] 02/02/2024, 8:17 AM       [1]  Allergies Allergen Reactions   Atorvastatin Other (See Comments)    aches  myalgia  myalgia    aches  myalgia   Ramipril Other (See Comments) and Cough    cough   "

## 2024-02-03 ENCOUNTER — Inpatient Hospital Stay

## 2024-02-03 ENCOUNTER — Encounter: Payer: Self-pay | Admitting: Hospitalist

## 2024-02-03 DIAGNOSIS — J9601 Acute respiratory failure with hypoxia: Secondary | ICD-10-CM | POA: Diagnosis not present

## 2024-02-03 DIAGNOSIS — I350 Nonrheumatic aortic (valve) stenosis: Secondary | ICD-10-CM | POA: Diagnosis not present

## 2024-02-03 DIAGNOSIS — I2510 Atherosclerotic heart disease of native coronary artery without angina pectoris: Secondary | ICD-10-CM | POA: Diagnosis not present

## 2024-02-03 DIAGNOSIS — I5033 Acute on chronic diastolic (congestive) heart failure: Secondary | ICD-10-CM | POA: Diagnosis not present

## 2024-02-03 DIAGNOSIS — I5031 Acute diastolic (congestive) heart failure: Secondary | ICD-10-CM | POA: Diagnosis not present

## 2024-02-03 LAB — RESP PANEL BY RT-PCR (RSV, FLU A&B, COVID)  RVPGX2
Influenza A by PCR: NEGATIVE
Influenza B by PCR: NEGATIVE
Resp Syncytial Virus by PCR: NEGATIVE
SARS Coronavirus 2 by RT PCR: NEGATIVE

## 2024-02-03 LAB — BASIC METABOLIC PANEL WITH GFR
Anion gap: 10 (ref 5–15)
BUN: 31 mg/dL — ABNORMAL HIGH (ref 8–23)
CO2: 31 mmol/L (ref 22–32)
Calcium: 9.2 mg/dL (ref 8.9–10.3)
Chloride: 99 mmol/L (ref 98–111)
Creatinine, Ser: 0.99 mg/dL (ref 0.61–1.24)
GFR, Estimated: >60 mL/min
Glucose, Bld: 94 mg/dL (ref 70–99)
Potassium: 4.4 mmol/L (ref 3.5–5.1)
Sodium: 140 mmol/L (ref 135–145)

## 2024-02-03 MED ORDER — ASPIRIN 81 MG PO CHEW
81.0000 mg | CHEWABLE_TABLET | ORAL | Status: AC
  Administered 2024-02-04: 81 mg via ORAL
  Filled 2024-02-03: qty 1

## 2024-02-03 MED ORDER — FREE WATER
250.0000 mL | Freq: Once | Status: AC
  Administered 2024-02-04: 250 mL via ORAL

## 2024-02-03 MED ORDER — FUROSEMIDE 10 MG/ML IJ SOLN
80.0000 mg | Freq: Two times a day (BID) | INTRAMUSCULAR | Status: AC
  Administered 2024-02-03 (×2): 80 mg via INTRAVENOUS
  Filled 2024-02-03 (×2): qty 8

## 2024-02-03 MED ORDER — ROSUVASTATIN CALCIUM 10 MG PO TABS
40.0000 mg | ORAL_TABLET | Freq: Every day | ORAL | Status: DC
  Administered 2024-02-03 – 2024-02-05 (×3): 40 mg via ORAL
  Filled 2024-02-03 (×3): qty 4

## 2024-02-03 MED ORDER — AMLODIPINE BESYLATE 5 MG PO TABS
5.0000 mg | ORAL_TABLET | Freq: Every day | ORAL | Status: DC
  Filled 2024-02-03: qty 1

## 2024-02-03 NOTE — H&P (Signed)
 "  History & Physical    Patient ID: LABRON BLOODGOOD MRN: [id number], DOB/AGE: [birth year]   Admit date: 02/01/2024   Primary Physician: [name], MD Primary Cardiologist: Evalene Lunger, MD    Patient Profile    83 year old male with a history of CAD status post CABG x 3 in June 2009, severe aortic stenosis status post TAVR in December 2018, hypertension, hyperlipidemia, statin intolerance, stroke, carotid stenosis status post left carotid endarterectomy in 2009 complicated by right laryngeal nerve paralysis, HFpEF, nephrolithiasis, remote tobacco abuse, sleep apnea, and remote PE (08/2010), who is being admitted on transfer from [facility] following admission for progressive heart failure, hypoxia, and dyspnea on 02/01/2024.  Past Medical History    Past Medical History:  Diagnosis Date   Actinic keratosis    Acute exacerbation of CHF (congestive heart failure) (HCC) 01/19/2024   Aortic stenosis s/p TAVR    a. 01/2017 s/p TAVR - 23mm Sapien; b. 11/2023 Echo: EF 50-55%, GrII DD, nl RV fxn, mild-mod MR, mild Ca2+ MS, Mod AS, AVA 0.84, mean gradient 32 mmHg, mild perivalvular leak; c. 01/2024 Echo: EF 50-55%, no rwma, GrII DD, nl RV fxn, mod dil LA, mildly dil RA, mild MR/MS, sev AS w/ mod perivalvular leak - mean grad , Vmax 4.52 m/s, DVI 0.13.   CAD (coronary artery disease)    a. 07/2007 s/p CABG x 3 [facility] [place]); b. 12/2023 MV: No isch/infarct; c. 01/2024 Cath: LM 95ost/m, LAD 118m, LCX 100ost/p/m, OM3 40, RCA small, 95p, 64m, RPDA fills via collats from LAD. VG->OM1 nl, VG->OM3 nl, LIMA->LAD nl.   Cancer (HCC)    Skin   Carotid artery disease    a. 2009 s/p left sided CEA complicated by RLN parlaysis; b. 02/2023 U/S: no significant dzs.   Cataract    Chronic heart failure with preserved ejection fraction (HFpEF) (HCC)    a. 01/2024 Echo: EF 50-55%, GrII DD, nl RV fxn, mod dil LA, mild MR/MS, severe AS, mod perivalvular leak; b. 01/2024 RHC: RA 3, RV 66/2, PA 55/17 (30), PCWP  20.   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   GERD (gastroesophageal reflux disease)    HLD (hyperlipidemia)    Hyperlipidemia 10/15/2015   Hypertension    Nephrolithiasis 11/16/2018   Pulmonary embolism (HCC) 08/2010   Skin cancer    Sleep apnea    Stroke [facility])     Past Surgical History:  Procedure Laterality Date   CARDIAC CATHETERIZATION     CARDIAC VALVE REPLACEMENT  01-19-17   Carotid surgery Left 03/08/2007   CHOLECYSTECTOMY  02/16/2018   COLONOSCOPY WITH PROPOFOL  N/A 09/01/2022   Procedure: COLONOSCOPY WITH PROPOFOL ;  Surgeon: Maryruth Ole DASEN, MD;  Location: [facility] ENDOSCOPY;  Service: Endoscopy;  Laterality: N/A;   CORONARY ARTERY BYPASS GRAFT     ENDOSCOPIC RETROGRADE CHOLANGIOPANCREATOGRAPHY (ERCP) WITH PROPOFOL  N/A 07/10/2021   Procedure: ENDOSCOPIC RETROGRADE CHOLANGIOPANCREATOGRAPHY (ERCP) WITH PROPOFOL ;  Surgeon: Jinny Carmine, MD;  Location: [facility] ENDOSCOPY;  Service: Endoscopy;  Laterality: N/A;   ERCP N/A 11/18/2018   Procedure: ENDOSCOPIC RETROGRADE CHOLANGIOPANCREATOGRAPHY (ERCP);  Surgeon: Jinny Carmine, MD;  Location: [facility] ENDOSCOPY;  Service: Endoscopy;  Laterality: N/A;   EUS N/A 08/21/2021   Procedure: UPPER ENDOSCOPIC ULTRASOUND (EUS) LINEAR;  Surgeon: Queenie Asberry LABOR, MD;  Location: [facility] ENDOSCOPY;  Service: Gastroenterology;  Laterality: N/A;  [facility]   EYE SURGERY  04-15-99   09-14-99   Heart Bypass N/A 07/2007   HERNIA REPAIR  03-12-88  Not sure of date   left shoulder surgery Left 06/03/2009   POLYPECTOMY  09/01/2022   Procedure: POLYPECTOMY;  Surgeon: Maryruth Ole DASEN, MD;  Location: [facility] ENDOSCOPY;  Service: Endoscopy;;   RIGHT AND LEFT HEART CATH  07/18/2007   right shoulder surgery  2003   RIGHT/LEFT HEART CATH AND CORONARY/GRAFT ANGIOGRAPHY N/A 02/04/2024   Procedure: RIGHT/LEFT HEART CATH AND CORONARY/GRAFT ANGIOGRAPHY;  Surgeon: Darron Deatrice LABOR, MD;  Location: [facility] INVASIVE CV LAB;  Service:  Cardiovascular;  Laterality: N/A;   TEE WITHOUT CARDIOVERSION N/A 11/21/2018   Procedure: TRANSESOPHAGEAL ECHOCARDIOGRAM (TEE);  Surgeon: Darron Deatrice LABOR, MD;  Location: [facility] ORS;  Service: Cardiovascular;  Laterality: N/A;   TVAR       Allergies  Allergies[1]  History of Present Illness    83 year old male with a history of CAD status post CABG x 3 in June 2009, severe aortic stenosis status post TAVR in December 2018, hypertension, hyperlipidemia, statin intolerance, stroke, carotid stenosis status post left carotid endarterectomy in 2009 complicated by right laryngeal nerve paralysis, HFpEF, nephrolithiasis, remote tobacco abuse, sleep apnea, and remote PE (08/2010).  Previously lived in [place], [place]  where he underwent three-vessel bypass in June 2009.  In the setting of progressive aortic stenosis, he underwent TAVR in December 2018.  Notes indicate that pre-TAVR diagnostic catheterization showed stable anatomy, though details are unclear.  He has been followed in our practice since 2020 and serial echocardiography, has shown progressive prosthetic valve aortic stenosis, with echo in October 2025 showing EF of 50 to 55% with moderate aortic stenosis and mean valve gradient of 32 mmHg.  Mild to moderate MR and mild calcific mitral stenosis were also noted at that time.  Unfortunately, over the past few months, he has had progressive dyspnea on exertion and lower extremity edema.  Stress testing in 12/2023 was low risk w/o ischemia/infarct.  He was admitted 12/10  01/22/2024 due to decompensated heart failure.  CT chest was neg for PE.  He was diuresed and d/c'd on supplemental O2.  He required outpt IV diuresis in [place] CHF clinic on 12/18 but remained markedly volume overloaded, dyspneic, and hypoxic (O2 sat 84% on RA) at office follow up on 02/01/2024, prompting readmission.  In the ED, CXR showed vascular congestion w/ small to mod bilat pleural effusions.  BNP up @ 2213.  Labs  otw unremarkable.  He was placed on IV lasix  and f/u echo showed EF of 50-55%, GrII DD, and severe AS w/ mean gradient of , DVI 0.13, and Vmax  of 4.69m/s.  Moderate perivalvular leak was also noted.  We consulted our structural heart team in [place], who recommended continued diuresis, R & L heart cath, and eventual transfer to [facility] for CT imaging to r/o thrombosis of the AoV prosthesis.  Pt required escalation of IV diuresis to Lasix  80 IV BID on 12/25 in the setting of ongoing dyspnea, orthopnea, edema, and limited response to a lower dose of lasix .  Decubitus films on 12/25 showed small R and moderate L layering effusions.  He underwent R & L heart cardiac catheterization on 12/26, showing significant underlying three-vessel CAD w/ 3/3 patent grafts (LIMA  LAD, VG  OM1, VG  OM3).  Right heart cath was notable for pulmonary hypertension in the setting of PCWP of 30/14 (20).  Further diuresis was recommended and he was also placed on heparin  infusion post-catheterization out of concern for potential prosthetic valve leaflet thrombosis.  He has responded well to intravenous diuresis though remains  volume overloaded.  He will be transferred to [facility] today for further evaluation by structural heart team.  Current Medications    Scheduled Meds:  aspirin   81 mg Oral Daily   budesonide  (PULMICORT ) nebulizer solution  0.25 mg Nebulization BID   furosemide   60 mg Intravenous BID   ipratropium-albuterol   3 mL Nebulization BID   metoprolol  succinate  25 mg Oral Daily   multivitamin with minerals  1 tablet Oral Daily   pantoprazole   40 mg Oral BID AC   potassium chloride  SA  40 mEq Oral BID   rosuvastatin   40 mg Oral Daily   sodium chloride  flush  3 mL Intravenous Q12H   Continuous Infusions:  heparin  1,100 Units/hr (02/06/24 0400)   PRN Meds:.acetaminophen  **OR** acetaminophen , montelukast , morphine  injection, ondansetron  (ZOFRAN ) IV, ondansetron  **OR** [DISCONTINUED] ondansetron  (ZOFRAN ) IV,  oxyCODONE , polyethylene glycol, sodium chloride  flush    Family History    Family History  Problem Relation Age of Onset   Hypertension Mother    Stroke Mother    Lung cancer Father    Alcohol abuse Father    Non-Hodgkin's lymphoma Brother    Varicose Veins Brother    He indicated that his mother is deceased. He indicated that his father is deceased. He indicated that both of his brothers are alive. He indicated that his son is alive.   Social History    Social History   Socioeconomic History   Marital status: Married    Spouse name: Not on file   Number of children: 2   Years of education: Not on file   Highest education level: Some college, no degree  Occupational History   Occupation: retired  Tobacco Use   Smoking status: Former    Current packs/day: 0.00    Types: Cigarettes    Quit date: 1979    Years since quitting: 47.0   Smokeless tobacco: Never   Tobacco comments:    Quit in 1979  Vaping Use   Vaping status: Never Used  Substance and Sexual Activity   Alcohol use: Never   Drug use: Never   Sexual activity: Yes    Partners: Female  Other Topics Concern   Not on file  Social History Narrative   Not on file   Social Drivers of Health   Tobacco Use: Medium Risk (02/01/2024)   Patient History    Smoking Tobacco Use: Former    Smokeless Tobacco Use: Never    Passive Exposure: Not on Actuary Strain: Low Risk  (12/02/2023)   Received from [facility]   Overall Financial Resource Strain (CARDIA)    Difficulty of Paying Living Expenses: Not hard at all  Food Insecurity: No Food Insecurity (02/03/2024)   Epic    Worried About Radiation Protection Practitioner of Food in the Last Year: Never true    Ran Out of Food in the Last Year: Never true  Transportation Needs: No Transportation Needs (02/03/2024)   Epic    Lack of Transportation (Medical): No    Lack of Transportation (Non-Medical): No  Physical Activity: Inactive (12/02/2023)    Received from [facility]   Exercise Vital Sign    On average, how many days per week do you engage in moderate to strenuous exercise (like a brisk walk)?: 0 days    On average, how many minutes do you engage in exercise at this level?: 0 min  Stress: No Stress Concern Present (11/05/2023)   Received from [facility]   [employer] of Occupational Health - Occupational Stress Questionnaire    Feeling of Stress : Not at all  Social Connections: Socially Integrated (02/03/2024)   Social Connection and Isolation Panel    Frequency of Communication with Friends and Family: More than three times a week    Frequency of Social Gatherings with Friends and Family: More than three times a week    Attends Religious Services: More than 4 times per year    Active Member of Clubs or Organizations: Yes    Attends Banker Meetings: More than 4 times per year    Marital Status: Married  Catering Manager Violence: Not At Risk (02/03/2024)   Epic    Fear of Current or Ex-Partner: No    Emotionally Abused: No    Physically Abused: No    Sexually Abused: No  Depression (PHQ2-9): Low Risk (10/23/2021)   Depression (PHQ2-9)    PHQ-2 Score: 0  Alcohol Screen: Low Risk (07/03/2021)   Alcohol Screen    Last Alcohol Screening Score (AUDIT): 0  Housing: Low Risk (02/03/2024)   Epic    Unable to Pay for Housing in the Last Year: No    Number of Times Moved in the Last Year: 0    Homeless in the Last Year: No  Utilities: Not At Risk (02/03/2024)   Epic    Threatened with loss of utilities: No  Health Literacy: Adequate Health Literacy (11/05/2023)   Received from [facility]   [phone] Health Literacy    How often do you need to have someone help you when you read instructions, pamphlets, or other written material from your doctor or pharmacy?: Never     Review of Systems    General:  No chills, fever, night sweats or weight changes.   Cardiovascular:  No chest pain, dyspnea on exertion, edema, orthopnea, palpitations, paroxysmal nocturnal dyspnea. Dermatological: No rash, lesions/masses Respiratory: No cough, dyspnea Urologic: No hematuria, dysuria Abdominal:   No nausea, vomiting, diarrhea, bright red blood per rectum, melena, or hematemesis Neurologic:  No visual changes, wkns, changes in mental status. All other systems reviewed and are otherwise negative except as noted above.  Physical Exam    Blood pressure (!) 109/46, pulse 65, temperature 98.6 F (37 C), resp. rate 18, height 5' 6 (1.676 m), weight 93.8 kg, SpO2 94%.  General: Pleasant, NAD Psych: Normal affect. Neuro: Alert and oriented X 3. Moves all extremities spontaneously. HEENT: Normal  Neck: Supple without bruits or JVD. Lungs:  Resp regular and unlabored, CTA. Heart: RRR, 2/6 SEM throughout, loudest @ the upper sternal borders.  2/6 diastolic murmur @ the LLSB.  No s3, s4. Abdomen: Soft, non-tender, non-distended, BS + x 4.  Extremities: No clubbing, cyanosis or edema. DP/PT 2+, Radials 2+ and equal bilaterally.  Labs    Recent Labs  Lab 01/19/24 1019 01/19/24 1234 01/19/24 1649 01/19/24 1842 02/01/24 1042  TRNPT 99* 117* 101* 108* 30*  ] Lab Results  Component Value Date   WBC 5.9 02/06/2024   HGB 13.1 02/06/2024   HCT 38.9 (L) 02/06/2024   MCV 94.4 02/06/2024   PLT 129 (L) 02/06/2024    Lab Results  Component Value Date   NA 141 02/06/2024   CL 98 02/06/2024   K 3.8 02/06/2024   CO2 36 (H) 02/06/2024   BUN 22 02/06/2024   CREATININE 1.20 02/06/2024   GFRNONAA >60 02/06/2024   CALCIUM  9.4 02/06/2024   PHOS 3.1 07/12/2021  ALBUMIN 3.7 02/02/2024   GLUCOSE 147 (H) 02/06/2024    Lab Results  Component Value Date   CHOL 133 10/07/2021   HDL 43 10/07/2021   LDLCALC 67 10/07/2021   TRIG 133 10/07/2021   ProBNP    Component Value Date/Time   PROBNP 2,213.0 (H) 02/01/2024 1042      Radiology Studies    DG Chest 2  View Result Date: 02/01/2024 EXAM: 2 VIEW(S) XRAY OF THE CHEST 02/01/2024 09:25:00 AM COMPARISON: 01/19/2024 CLINICAL HISTORY: 83 year old male with persistent shortness of breath for 1 month. FINDINGS: LUNGS AND PLEURA: Vascular congestion/mild interstitial pulmonary edema. Small to moderate bilateral pleural effusions. Bibasilar airspace opacities. No pneumothorax. No significant change since 01/19/2024. HEART AND MEDIASTINUM: Cardiomegaly. Prosthetic aortic valve. BONES AND SOFT TISSUES: Prior median sternotomy. No acute osseous abnormality. IMPRESSION: 1. Pulmonary vascular congestion with bilateral pleural effusions not significantly changed since December/11/2023 2. No new cardiopulmonary abnormality. Electronically signed by: Helayne Hurst MD 02/01/2024 10:44 AM EST RP Workstation: [id number]   US  Venous Img Lower Bilateral (DVT) Result Date: 01/20/2024 CLINICAL DATA:  83 year old male with lower extremity pain. EXAM: BILATERAL LOWER EXTREMITY VENOUS DOPPLER ULTRASOUND  IMPRESSION: No evidence of bilateral lower extremity deep venous thrombosis. Ester Sides, MD [facility] Rad[facility] Electronically Signed   By: Ester Sides M.D.   On: 01/20/2024 13:01   NM Myocar Multi W/Spect W/Wall Motion / EF Result Date: 01/05/2024   No ST deviation was noted. Pharmacological myocardial perfusion imaging study with no significant  ischemia Small region fixed defect distal inferolateral wall, unable to exclude prior MI No focal wall motion abnormality, EF estimated at 30% (poor estimation in the setting of GI and attenuation artifact) No EKG changes concerning for ischemia at peak stress or in recovery. Low risk scan Recent echo with low normal ejection fraction 50 to 55% Signed, Velinda Lunger, MD, Ph.D [facility]    ECG & Cardiac Imaging    02/01/2024 @ 09:07 - RSR, 76, Rightward axis, IVCD, poor R progression, inferolateral ST/T abnormalities - personally  reviewed.  Assessment & Plan    1. Acute hypoxemic respiratory failure/acute on chronic HFpEF/Bilateral Pleural effusions: Patient admitted for the second time in the month of December due to progressive dyspnea on exertion and volume overload in the setting of progressive bioprosthetic aortic valve stenosis. Saturating in the 80s at office visit on December 23. proBNP on admission 2213 and chest x-ray showing pulmonary vascular congestion and bilateral pleural effusions (small to mod - unchanged from earlier this month). Echo this admission with an EF of 50 to 55% and grade 2 diastolic dysfunction, with presence of severe aortic stenosis and mean aortic valve gradient of 54 mmHg.  Right and left heart cath showed persistently elevated filling pressures [wedge of 22, PA 55/17 (30)], and 3 of 3 patent grafts with native multivessel disease.  No targets for intervention.  Responded well to intravenous Lasix  + metolazone  2.5 mg yesterday and was minus 1.2 L.  Wt down 1.3 kg since yesterday.  Renal function relatively stable to creatinine slightly higher at 1.20.  He continues to have significant lower extremity edema and coarse breath sounds.  Will give additional 2.5 mg metolazone  today as he seems to respond better to this than to furosemide  by itself.  2.  Severe bioprosthetic aortic valve stenosis:  s/p TAVR w/ 23mm Sapien valve in [place], [place] in 01/2017.  Noted in outpt setting to have progressive AoV stenosis complicated by progressive dyspnea, volume overload,  and now his second admission this month due to heart failure.  Echo this admission w/ mean AoV gradient of 54 mmHg consistent with severe stenosis and moderate perivalvular leak.  Volume overloaded as outlined above.  Continue diuresis.  Structural heart team to see tomorrow with plan for CT to rule out valve thrombosis.  Continue heparin .  3.  CAD/demand ischemia: History of CABG x 3 in 2009 with low risk Myoview  November 2025.  Diagnostic  catheterization this admission with 3 of 3 patent grafts and severe native disease without targets for intervention.  Continue aspirin , beta-blocker, and statin therapy.  4.  Primary hypertension: Pressure stable on beta-blocker and diuretic therapy.  He is off of prior home dose of amlodipine , which may have been contributing some to his lower extremity edema.  5.  Hyperlipidemia: Continue rosuvastatin  40 mg-increase this admission in the setting of an LDL of 76 earlier this year.  No complaints of myalgias, which he experienced previously on atorvastatin.  Hypokalemia: Stable at 3.8 on potassium supplementation.  Risk Assessment/Risk Scores:        New [place]  Heart Association (NYHA) Functional Class NYHA Class IV    Signed, Lonni Meager, NP 02/06/2024, 10:31 AM        [1]  Allergies Allergen Reactions   Atorvastatin Other (See Comments)    aches  myalgia  myalgia    aches  myalgia   Ramipril Other (See Comments) and Cough    cough   "

## 2024-02-03 NOTE — Progress Notes (Addendum)
 "  Cardiology Progress Note   Patient Name: Jacob Montes Date of Encounter: 02/03/2024  Primary Cardiologist: Evalene Lunger, MD  Subjective   Slept ok - part-time in bed, part-time in chair.  Didn't feel orthopneic.  Has ambulated some in room and breathing slightly better.  Notes poor response to IV lasix  last night - only about 150 ml out since then.  Still w/ significant bilat LEE.  Has had a cough at home and now bringing up thick, clear sputum. Objective   Inpatient Medications    Scheduled Meds:  amLODipine   10 mg Oral Daily   aspirin   81 mg Oral Daily   enoxaparin  (LOVENOX ) injection  45 mg Subcutaneous Q24H   metoprolol  succinate  25 mg Oral Daily   multivitamin with minerals  1 tablet Oral Daily   pantoprazole   40 mg Oral BID AC   potassium chloride  SA  20 mEq Oral Daily   rosuvastatin   40 mg Oral Daily   Continuous Infusions:  PRN Meds: acetaminophen  **OR** acetaminophen , montelukast , morphine  injection, ondansetron  **OR** [DISCONTINUED] ondansetron  (ZOFRAN ) IV, oxyCODONE , polyethylene glycol   Vital Signs    Vitals:   02/03/24 0059 02/03/24 0358 02/03/24 0424 02/03/24 0500  BP:  (!) 124/46    Pulse:  68    Resp: 20 19  16   Temp:  98.6 F (37 C)    TempSrc:  Oral    SpO2:  95%    Weight:   95.6 kg     Intake/Output Summary (Last 24 hours) at 02/03/2024 0714 Last data filed at 02/02/2024 2100 Gross per 24 hour  Intake --  Output 550 ml  Net -550 ml   Filed Weights   02/03/24 0424  Weight: 95.6 kg    Physical Exam   GEN: Well nourished, well developed, in no acute distress.  HEENT: Grossly normal.  Neck: Supple, mod elev JVP, no carotid bruits or masses. Cardiac: RRR, 2/6 SEM loudest @ the upper sternal borders (heard throughout) w/ 2/6 diast murmur @ LLSB.  No rubs or gallops. No clubbing, cyanosis, 2+ bilat LE edema.  Radials 2+, DP/PT 2+ and equal bilaterally.  Respiratory:  Respirations regular and unlabored, markedly diminished @ bilat bases  ~ 1/3 way up.  Otw CTA. GI: Obese, soft, nontender, nondistended, BS + x 4. MS: no deformity or atrophy. Skin: warm and dry, no rash. Neuro:  Strength and sensation are intact. Psych: AAOx3.  Normal affect.  Labs    Chemistry Recent Labs  Lab 02/01/24 1042 02/02/24 0819 02/03/24 0400  NA 139 140 140  K 4.6 4.1 4.4  CL 101 100 99  CO2 31 33* 31  GLUCOSE 93 87 94  BUN 25* 27* 31*  CREATININE 0.87 0.91 0.99  CALCIUM  9.3 9.4 9.2  PROT  --  6.2*  --   ALBUMIN  --  3.7  --   AST  --  31  --   ALT  --  17  --   ALKPHOS  --  78  --   BILITOT  --  1.4*  --   GFRNONAA >60 >60 >60  ANIONGAP 8 7 10      Hematology Recent Labs  Lab 02/01/24 1042 02/02/24 0819  WBC 5.4 4.9  RBC 3.96* 4.03*  HGB 12.9* 13.0  HCT 38.6* 39.7  MCV 97.5 98.5  MCH 32.6 32.3  MCHC 33.4 32.7  RDW 14.6 14.6  PLT 133* 142*    Cardiac Enzymes  Recent Labs  Lab 01/19/24 1019  01/19/24 1234 01/19/24 1649 01/19/24 1842 02/01/24 1042  TRNPT 99* 117* 101* 108* 30*  ]  ProBNP    Component Value Date/Time   PROBNP 2,213.0 (H) 02/01/2024 1042   Lipids  Lab Results  Component Value Date   CHOL 133 10/07/2021   HDL 43 10/07/2021   LDLCALC 67 10/07/2021   TRIG 133 10/07/2021   CHOLHDL 3.1 10/07/2021    HbA1c  Lab Results  Component Value Date   HGBA1C 5.9 (H) 10/07/2021    Radiology    DG Chest 2 View Result Date: 02/01/2024 EXAM: 2 VIEW(S) XRAY OF THE CHEST 02/01/2024 09:25:00 AM COMPARISON: 01/19/2024 CLINICAL HISTORY: 83 year old male with persistent shortness of breath for 1 month. FINDINGS: LUNGS AND PLEURA: Vascular congestion/mild interstitial pulmonary edema. Small to moderate bilateral pleural effusions. Bibasilar airspace opacities. No pneumothorax. No significant change since 01/19/2024. HEART AND MEDIASTINUM: Cardiomegaly. Prosthetic aortic valve. BONES AND SOFT TISSUES: Prior median sternotomy. No acute osseous abnormality. IMPRESSION: 1. Pulmonary vascular congestion with  bilateral pleural effusions not significantly changed since December/11/2023 2. No new cardiopulmonary abnormality. Electronically signed by: Helayne Hurst MD 02/01/2024 10:44 AM EST RP Workstation: [id number]     Telemetry    RSR, mid-60's to low-70's, occas PVC/ventricular couplet - Personally Reviewed  Cardiac Studies   2D Echocardiogram 12.23.2025  1. Left ventricular ejection fraction, by estimation, is 50 to 55%. The  left ventricle has low normal function. The left ventricle has no regional  wall motion abnormalities. The left ventricular internal cavity size was  mildly dilated. Left ventricular  diastolic parameters are consistent with Grade II diastolic dysfunction  (pseudonormalization).   2. Right ventricular systolic function was not well visualized. The right  ventricular size is not well visualized.   3. Left atrial size was moderately dilated.   4. Right atrial size was mildly dilated.   5. The mitral valve is degenerative. Mild mitral valve regurgitation.  Mild calcific mitral stenosis. Severe mitral annular calcification.   6. There is a 23 mm Sapien prosthetic (TAVR) valve present in the aortic  position. Procedure Date: 01/19/2017. Echo findings are consistent with  severe stenosis and moderate perivalvular leak of the aortic prosthesis.  Aortic valve mean gradient measures   54.0 mmHg. Aortic valve Vmax measures 4.52 m/s. DVI 0.13.   7. The inferior vena cava is dilated in size with >50% respiratory  variability, suggesting right atrial pressure of 8 mmHg.  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  Assessment & Plan    1.  Acute hypoxemic respiratory failure/acute on chronic HFpEF/Bilateral Pleural effusions: Patient admitted for the second time in the month of December due to progressive dyspnea on exertion and volume overload in the setting of progressive bioprosthetic aortic valve stenosis.  Saturating in the 80s at office visit on December 23.  proBNP on admission 2213 and chest x-ray showing pulmonary vascular congestion and bilateral pleural effusions (small to mod - unchanged from  earlier this month).  Echo this admission with an EF of 50 to 55% and grade 2 diastolic dysfunction, with presence of severe aortic stenosis and mean aortic valve gradient of 54 mmHg.  He has been on furosemide  40 mg IV twice daily.  I's and O's are inaccurate as he spent much of the day yesterday in the emergency room.  He is listed at 95.6 kg this morning, which is 1.4 kg above his prior discharge weight.  He notes that he's only put out about 150 ml of dark urine since his PM dose of lasix  last night.  Persistent 2+ bilat LEE to knees.  Markedly diminished breath sounds @ bilat bases.  Renal fxn relatively stable w/ only slight rise in BUN/Creat to 31/0.99.  Bicarb stable @ 33.  Lasix  held this AM.  Will obtain decubitus films to better assess effusions.  R & L heart cath planned tomorrow which will assist in guiding diuresis.  With chronic lower ext edema, will reduce amlodipine  to 5 mg daily as this is likely contributing.  May need to wean off completely and add alternate agent (ARB).  Adding TEDS.  2.  Severe Bioprosthetic Aortic Valve Stenosis:  s/p TAVR w/ 23mm Sapien valve in [place], [place] in 01/2017.  Noted in outpt setting to have progressive AoV  stenosis complicated by progressive dyspnea, volume overload, and now his second admission this month due to heart failure.  Echo this admission w/ mean AoV gradient of 54 mmHg consistent with severe stenosis and moderate perivalvular leak.  Discussed with structural heart team in [place]. Breathing sl better w/ diuresis - held this AM.  Plan for right and left heart cardiac catheterization tomorrow morning followed by transfer to [facility] over the weekend for structural heart team eval and CT imaging of AoV to r/o thombosis.  Pt and wife aware of plan and are in agreement. Informed Consent   Shared Decision Making/Informed Consent The risks, including but not limited to, [bleeding or vascular complications (1 in 500), pneumothorax (1 in 1600), arrhythmia (1 in 1000) and death (1 in 5000)], benefits (diagnostic support and/or management of heart failure, pulmonary hypertension) and alternatives of a right heart catheterization were discussed in detail with Mr. Dunklee and he is willing to proceed. The risks [stroke (1 in 1000), death (1 in 1000), kidney failure [usually temporary] (1 in 500), bleeding (1 in 200), allergic reaction [possibly serious] (1 in 200)], benefits (diagnostic support and management of coronary artery disease) and alternatives of a cardiac catheterization were discussed in detail with Mr. Monforte and he is willing to proceed.      3.  CAD/demand ischemia: History of CABG of unknown number of vessels in 2009.  He had a low risk Myoview  in November 2025.  He has not been having chest pain but in the setting of heart failure admissions, troponin up to 117 on December 10, and less significantly elevated this admission, at 30.  Echo with an EF of 50-55% without regional wall motion abnormalities.  As outlined above, plan for right and left heart cardiac catheterization tomorrow.  Continue aspirin , beta-blocker, and statin therapy.  4.  Primary hypertension: Stable this morning.  Continue  beta-blocker.  Reducing amlodipine  to 5mg  in setting of chronic lower ext edema - may be clouding picture.  Consider weaning ccb further and adding ARB.  5.  Hyperlipidemia: LDL of 76 in October 2025.  LFTs normal this admission.  Currently on rosuvastatin  20 mg.  I will escalate to 40  mg daily given LDL not at goal.  Will need to watch for myalgias as he did not tolerate atorvastatin in the past.  Signed, Lonni Meager, NP  02/03/2024, 7:14 AM    For questions or updates, please contact   Please consult [email or web address] for contact info under Cardiology/STEMI.  "

## 2024-02-03 NOTE — Progress Notes (Addendum)
 " PROGRESS NOTE    Jacob Montes   FMW:[id number] DOB: 03/02/1940  DOA: 02/01/2024 Date of Service: 02/03/2024 which is hospital day 1  PCP: [name], MD    Hospital course / significant events:   HPI: Jacob Montes is a pleasant 83 y.o. 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  Patient was in the hospital between 01/19/2024 and 02/08/2024 for decompensated heart failure.  Patient was treated with IV antibiotic with some improvement.  Patient was followed up in cardiology clinic on 01/27/2024, he was treated with IV diuretics then, today patient went to cardiology office where he reported not feeling well rather his swelling did not get better and got worse.  He complained of significant shortness of breath, orthopnea, hypoxia and lower extremity edema.  Oxygen saturation at the clinic today was 84% on room air.  Dr. Argentina from cardiology transferred the patient for further diuresis and treatment of congestive heart failure.  Patient denies any fever, chills, nausea, vomiting.  He complains of progressive worsening shortness of breath with leg swelling.  He also complains of some orthopnea denies any PND.  Denies any palpitations.   12/23: Upon arrival to the ED, patient is found to be tachypneic at 22, hypoxic requiring oxygen, room air oxygen saturation was 84%, 92% on 2 L.  Patient was given 80 mg of IV Lasix  with some improvement.  Hospitalist service was consulted for evaluation for admission for acute congestive heart failure exacerbation with hypoxia requiring IV diuresis. 12/24: Per cardiology - initial plan this morning was transfer to [facility] under Dr Delford for structural heart eval/imaging, through the morning this  plan evolved to now getting cardiac cath this Friday 12/26 and likely to [facility] from there  12/25: pt reports worsening productive cough, CXR does look like some pleural effusion probably mild/moderate, repeated Flu/COVID as we have seen multiple flu cases, this was negative. Per cardiology - increased lasix      Consultants:  Cardiology - [facility]   Procedures/Surgeries: none      ASSESSMENT & PLAN:    Acute exacerbation of congestive heart failure with hypoxia Last echo EF from 50 to 55%. IV and p.o. Lasix  outpatient failed. Diuresis  Strict I&O, daily weight, salt/fluid restriction Cardiology following - plan transfer to [facility] under Dr Delford for structural heart eval/imaging   Acute hypoxic respiratory failure d/t CHF exacerbation and  pleural effusion  He was saturating 84% on room air. Worsening cough today  Supplemental O2 as needed  Lasix  increased today  Check COVID/Flu --> negative   HTN Blood pressure has been stable Continue amlodipine  10 mg daily, metoprolol  25 mg,  Diuresis as above Cardiology following   HLD Continue on rosuvastatin  20 mg   History of asthma Continue Singulair  Will place him on nebulizer as needed   History of CAD s/p CABG, AS s/p TAVR Continue aspirin , metoprolol , rosuvastatin      N/a based on BMI: There is no height or weight on file to calculate BMI.. Significantly low or high BMI is associated with higher medical risk.  Underweight - under 18  overweight - 25 to 29 obese - 30 or more Class 1 obesity: BMI of 30.0 to 34 Class 2 obesity: BMI of 35.0 to 39 Class  3 obesity: BMI of 40.0 to 49 Super Morbid Obesity: BMI 50-59 Super-super Morbid Obesity: BMI 60+ Healthy nutrition and physical activity advised as adjunct to other disease management and risk reduction treatments    DVT prophylaxis: lovenox  IV fluids: no continuous IV fluids  Nutrition: cardiac diet Central lines / other devices: none  Code Status: FULL  CODE ACP documentation reviewed:  none on file in [facility]  TOC needs: TBD Medical barriers to dispo: pend cardiac cath. Expected medical readiness for transfer to [facility] likely 02/04/24.              Subjective / Brief ROS:  Patient reports no cconerns on rounds, wife reminds him his cough is worse today  Denies CP/SOB.  Pain controlled.  Denies new weakness.  Tolerating diet.  Reports no concerns w/ urination/defecation.   Family Communication: wife at bedside on rouds    Objective Findings:  Vitals:   02/03/24 0500 02/03/24 0905 02/03/24 1447 02/03/24 1619  BP:  (!) 101/39  (!) 115/54  Pulse:  66  67  Resp: 16 18  18   Temp:  98.9 F (37.2 C)  99.6 F (37.6 C)  TempSrc:      SpO2:  99%  98%  Weight:      Height:   5' 6 (1.676 m)     Intake/Output Summary (Last 24 hours) at 02/03/2024 1623 Last data filed at 02/03/2024 1200 Gross per 24 hour  Intake 240 ml  Output 475 ml  Net -235 ml   Filed Weights   02/03/24 0424  Weight: 95.6 kg    Examination:  Physical Exam Constitutional:      General: He is not in acute distress.    Appearance: He is not ill-appearing.  Cardiovascular:     Rate and Rhythm: Normal rate and regular rhythm.  Pulmonary:     Effort: Pulmonary effort is normal.     Breath sounds: Examination of the right-lower field reveals decreased breath sounds and rales. Examination of the left-lower field reveals decreased breath sounds and rales. Decreased breath sounds and rales present.  Neurological:     Mental Status: He is alert and oriented to person, place, and time.  Psychiatric:        Mood and Affect: Mood normal.        Behavior: Behavior normal.          Scheduled Medications:   amLODipine   5 mg Oral Daily   aspirin   81 mg Oral Daily   [START ON 02/04/2024] aspirin   81 mg Oral Pre-Cath   enoxaparin  (LOVENOX ) injection  45 mg Subcutaneous Q24H   [START ON 02/04/2024] free water   250 mL Oral Once   furosemide    80 mg Intravenous BID   metoprolol  succinate  25 mg Oral Daily   multivitamin with minerals  1 tablet Oral Daily   pantoprazole   40 mg Oral BID AC   potassium chloride  SA  20 mEq Oral Daily   rosuvastatin   40 mg Oral Daily    Continuous Infusions:   PRN Medications:  acetaminophen  **OR** acetaminophen , montelukast , morphine  injection, ondansetron  **OR** [DISCONTINUED] ondansetron  (ZOFRAN ) IV, oxyCODONE , polyethylene glycol  Antimicrobials from admission:  Anti-infectives (From admission, onward)    None           Data Reviewed:  I have personally reviewed the following...  CBC: Recent Labs  Lab 02/01/24 1042 02/02/24 0819  WBC 5.4 4.9  HGB 12.9* 13.0  HCT 38.6* 39.7  MCV 97.5 98.5  PLT 133* 142*   Basic Metabolic Panel: Recent Labs  Lab 01/28/24 0930 02/01/24 1042 02/02/24 0819 02/03/24 0400  NA 139 139 140 140  K 4.1 4.6 4.1 4.4  CL 102 101 100 99  CO2 30 31 33* 31  GLUCOSE 130* 93 87 94  BUN 14 25* 27* 31*  CREATININE 0.80 0.87 0.91 0.99  CALCIUM  9.4 9.3 9.4 9.2   GFR: Estimated Creatinine Clearance: 61.2 mL/min (by C-G formula based on SCr of 0.99 mg/dL). Liver Function Tests: Recent Labs  Lab 02/02/24 0819  AST 31  ALT 17  ALKPHOS 78  BILITOT 1.4*  PROT 6.2*  ALBUMIN 3.7   No results for input(s): LIPASE, AMYLASE in the last 168 hours. No results for input(s): AMMONIA in the last 168 hours. Coagulation Profile: Recent Labs  Lab 02/02/24 0819  INR 1.1   Cardiac Enzymes: No results for input(s): CKTOTAL, CKMB, CKMBINDEX, TROPONINI in the last 168 hours. BNP (last 3 results) Recent Labs    01/22/24 0347 01/28/24 0930 02/01/24 1042  PROBNP 1,643.0* 1,299.0* 2,213.0*   HbA1C: No results for input(s): HGBA1C in the last 72 hours. CBG: No results for input(s): GLUCAP in the last 168 hours. Lipid Profile: No results for input(s): CHOL, HDL, LDLCALC, TRIG, CHOLHDL, LDLDIRECT in the last 72  hours. Thyroid  Function Tests: No results for input(s): TSH, T4TOTAL, FREET4, T3FREE, THYROIDAB in the last 72 hours. Anemia Panel: No results for input(s): VITAMINB12, FOLATE, FERRITIN, TIBC, IRON, RETICCTPCT in the last 72 hours. Most Recent Urinalysis On File:     Component Value Date/Time   COLORURINE YELLOW (A) 03/04/2023 1311   APPEARANCEUR CLEAR (A) 03/04/2023 1311   LABSPEC 1.012 03/04/2023 1311   PHURINE 5.0 03/04/2023 1311   GLUCOSEU NEGATIVE 03/04/2023 1311   HGBUR NEGATIVE 03/04/2023 1311   BILIRUBINUR NEGATIVE 03/04/2023 1311   BILIRUBINUR Negative 10/14/2021 1059   KETONESUR NEGATIVE 03/04/2023 1311   PROTEINUR NEGATIVE 03/04/2023 1311   UROBILINOGEN 0.2 10/14/2021 1059   NITRITE NEGATIVE 03/04/2023 1311   LEUKOCYTESUR NEGATIVE 03/04/2023 1311   Sepsis Labs: @LABRCNTIP (procalcitonin:4,lacticidven:4) Microbiology: Recent Results (from the past 240 hours)  Resp panel by RT-PCR (RSV, Flu A&B, Covid) Anterior Nasal Swab     Status: None   Collection Time: 02/01/24 11:56 AM   Specimen: Anterior Nasal Swab  Result Value Ref Range Status   SARS Coronavirus 2 by RT PCR NEGATIVE NEGATIVE Final    Comment: (NOTE) SARS-CoV-2 target nucleic acids are NOT DETECTED.  The SARS-CoV-2 RNA is generally detectable in upper respiratory specimens during the acute phase of infection. The lowest concentration of SARS-CoV-2 viral copies this assay can detect is 138 copies/mL. A negative result does not preclude SARS-Cov-2 infection and should not be used as the sole basis for treatment or other patient management decisions. A negative result may occur with  improper specimen collection/handling, submission of specimen other than nasopharyngeal swab, presence of viral mutation(s) within the areas targeted by this assay, and inadequate number of viral copies(<138 copies/mL). A negative result must be combined with clinical observations, patient history, and  epidemiological information. The expected result is Negative.  Fact Sheet for Patients:  bloggercourse.com  Fact Sheet for Healthcare Providers:  seriousbroker.it  This test is no t yet approved or cleared by the [place]  FDA and  has been authorized for detection and/or diagnosis of SARS-CoV-2 by FDA under an Emergency Use Authorization (EUA). This EUA will remain  in effect (meaning this test can be used) for the duration of  the COVID-19 declaration under Section 564(b)(1) of the Act, 21 U.S.C.section 360bbb-3(b)(1), unless the authorization is terminated  or revoked sooner.       Influenza A by PCR NEGATIVE NEGATIVE Final   Influenza B by PCR NEGATIVE NEGATIVE Final    Comment: (NOTE) The Xpert Xpress SARS-CoV-2/FLU/RSV plus assay is intended as an aid in the diagnosis of influenza from Nasopharyngeal swab specimens and should not be used as a sole basis for treatment. Nasal washings and aspirates are unacceptable for Xpert Xpress SARS-CoV-2/FLU/RSV testing.  Fact Sheet for Patients: bloggercourse.com  Fact Sheet for Healthcare Providers: seriousbroker.it  This test is not yet approved or cleared by the [place]  FDA and has been authorized for detection and/or diagnosis of SARS-CoV-2 by FDA under an Emergency Use Authorization (EUA). This EUA will remain in effect (meaning this test can be used) for the duration of the COVID-19 declaration under Section 564(b)(1) of the Act, 21 U.S.C. section 360bbb-3(b)(1), unless the authorization is terminated or revoked.     Resp Syncytial Virus by PCR NEGATIVE NEGATIVE Final    Comment: (NOTE) Fact Sheet for Patients: bloggercourse.com  Fact Sheet for Healthcare Providers: seriousbroker.it  This test is not yet approved or cleared by the [place]  FDA and has been  authorized for detection and/or diagnosis of SARS-CoV-2 by FDA under an Emergency Use Authorization (EUA). This EUA will remain in effect (meaning this test can be used) for the duration of the COVID-19 declaration under Section 564(b)(1) of the Act, 21 U.S.C. section 360bbb-3(b)(1), unless the authorization is terminated or revoked.  Performed at [facility], [address] [place], [place] [address]   Resp panel by RT-PCR (RSV, Flu A&B, Covid) Anterior Nasal Swab     Status: None   Collection Time: 02/03/24 12:25 PM   Specimen: Anterior Nasal Swab  Result Value Ref Range Status   SARS Coronavirus 2 by RT PCR NEGATIVE NEGATIVE Final    Comment: (NOTE) SARS-CoV-2 target nucleic acids are NOT DETECTED.  The SARS-CoV-2 RNA is generally detectable in upper respiratory specimens during the acute phase of infection. The lowest concentration of SARS-CoV-2 viral copies this assay can detect is 138 copies/mL. A negative result does not preclude SARS-Cov-2 infection and should not be used as the sole basis for treatment or other patient management decisions. A negative result may occur with  improper specimen collection/handling, submission of specimen other than nasopharyngeal swab, presence of viral mutation(s) within the areas targeted by this assay, and inadequate number of viral copies(<138 copies/mL). A negative result must be combined with clinical observations, patient history, and epidemiological information. The expected result is Negative.  Fact Sheet for Patients:  bloggercourse.com  Fact Sheet for Healthcare Providers:  seriousbroker.it  This test is no t yet approved or cleared by the [place]  FDA and  has been authorized for detection and/or diagnosis of SARS-CoV-2 by FDA under an Emergency Use Authorization (EUA). This EUA will remain  in effect (meaning this test can be used) for the duration of  the COVID-19 declaration under Section 564(b)(1) of the Act, 21 U.S.C.section 360bbb-3(b)(1), unless the authorization is terminated  or revoked sooner.       Influenza A by PCR NEGATIVE NEGATIVE Final   Influenza B by PCR NEGATIVE NEGATIVE Final    Comment: (NOTE) The Xpert Xpress SARS-CoV-2/FLU/RSV plus assay is intended as an aid in the diagnosis of influenza from Nasopharyngeal swab specimens and should not be used as a sole basis for treatment. Nasal washings  and aspirates are unacceptable for Xpert Xpress SARS-CoV-2/FLU/RSV testing.  Fact Sheet for Patients: bloggercourse.com  Fact Sheet for Healthcare Providers: seriousbroker.it  This test is not yet approved or cleared by the [place]  FDA and has been authorized for detection and/or diagnosis of SARS-CoV-2 by FDA under an Emergency Use Authorization (EUA). This EUA will remain in effect (meaning this test can be used) for the duration of the COVID-19 declaration under Section 564(b)(1) of the Act, 21 U.S.C. section 360bbb-3(b)(1), unless the authorization is terminated or revoked.     Resp Syncytial Virus by PCR NEGATIVE NEGATIVE Final    Comment: (NOTE) Fact Sheet for Patients: bloggercourse.com  Fact Sheet for Healthcare Providers: seriousbroker.it  This test is not yet approved or cleared by the [place]  FDA and has been authorized for detection and/or diagnosis of SARS-CoV-2 by FDA under an Emergency Use Authorization (EUA). This EUA will remain in effect (meaning this test can be used) for the duration of the COVID-19 declaration under Section 564(b)(1) of the Act, 21 U.S.C. section 360bbb-3(b)(1), unless the authorization is terminated or revoked.  Performed at [facility], [address] [place], [place] [address]       Radiology Studies last 3 days: ECHOCARDIOGRAM  COMPLETE Result Date: 02/02/2024    ECHOCARDIOGRAM REPORT   Patient Name:   Jacob Montes Date of Exam: 02/01/2024 Medical Rec #:  [id number]     Height:       66.0 in Accession #:    [id number]    Weight:       209.0 lb Date of Birth:  May 14, 1940     BSA:          2.038 m Patient Age:    83 years      BP:           112/42 mmHg Patient Gender: M             HR:           77 bpm. Exam Location:  [facility] Procedure: 2D Echo, Cardiac Doppler, Color Doppler and Intracardiac            Opacification Agent (Both Spectral and Color Flow Doppler were            utilized during procedure). Indications:     I50.31 Acute Diastolic Heart Failure  History:         Patient has prior history of Echocardiogram examinations, most                  recent 11/11/2023. CAD, TAVR. Left Carotid Endarterectomy. and                  Prior CABG, Stroke; Risk Factors:Dyslipidemia, Hypertension,                  Sleep Apnea and Former Smoker. Pulmonary embolism.                  Aortic Valve: 23 mm Sapien prosthetic, stented (TAVR) valve is                  present in the aortic position. Procedure Date: 01/19/2017.  Sonographer:     Carl Coma RDCS Referring Phys:  [id number] [facility] PAUDEL Diagnosing Phys: Caron Poser IMPRESSIONS  1. Left ventricular ejection fraction, by estimation, is 50 to 55%. The left ventricle has low normal function. The left ventricle has no regional wall motion abnormalities. The left ventricular internal cavity size was mildly  dilated. Left ventricular diastolic parameters are consistent with Grade II diastolic dysfunction (pseudonormalization).  2. Right ventricular systolic function was not well visualized. The right ventricular size is not well visualized.  3. Left atrial size was moderately dilated.  4. Right atrial size was mildly dilated.  5. The mitral valve is degenerative. Mild mitral valve regurgitation. Mild calcific mitral stenosis. Severe mitral annular calcification.  6. There is a 23 mm Sapien  prosthetic (TAVR) valve present in the aortic position. Procedure Date: 01/19/2017. Echo findings are consistent with severe stenosis and moderate perivalvular leak of the aortic prosthesis. Aortic valve mean gradient measures  54.0 mmHg. Aortic valve Vmax measures 4.52 m/s. DVI 0.13.  7. The inferior vena cava is dilated in size with >50% respiratory variability, suggesting right atrial pressure of 8 mmHg. Comparison(s): A prior study was performed on 11/11/2023. TAVR indices now meet threshold for severe stenosis. Vmax previously 3.64, MG 32 mmHg, DVI 0.26. Recommend valve team consult. FINDINGS  Left Ventricle: Left ventricular ejection fraction, by estimation, is 50 to 55%. The left ventricle has low normal function. The left ventricle has no regional wall motion abnormalities. Definity  contrast agent was given IV to delineate the left ventricular endocardial borders. The left ventricular internal cavity size was mildly dilated. There is no left ventricular hypertrophy. Left ventricular diastolic parameters are consistent with Grade II diastolic dysfunction (pseudonormalization). Right Ventricle: The right ventricular size is not well visualized. Right vetricular wall thickness was not well visualized. Right ventricular systolic function was not well visualized. Left Atrium: Left atrial size was moderately dilated. Right Atrium: Right atrial size was mildly dilated. Pericardium: There is no evidence of pericardial effusion. Mitral Valve: The mitral valve is degenerative in appearance. There is severe calcification of the mitral valve leaflet(s). Moderate mitral annular calcification. Mild mitral valve regurgitation. Mild mitral valve stenosis. Tricuspid Valve: The tricuspid valve is not well visualized. Tricuspid valve regurgitation is not demonstrated. No evidence of tricuspid stenosis. There is a 23 mm Sapien prosthetic, stented (TAVR) valve present in the aortic position. Procedure Date: 01/19/2017. Pulmonic  Valve: The pulmonic valve was not well visualized. Pulmonic valve regurgitation is not visualized. No evidence of pulmonic stenosis. Aorta: The aortic root is normal in size and structure. Venous: The inferior vena cava is dilated in size with greater than 50% respiratory variability, suggesting right atrial pressure of 8 mmHg. IAS/Shunts: The interatrial septum was not well visualized.  LEFT VENTRICLE PLAX 2D LVIDd:         6.30 cm Diastology LVIDs:         5.10 cm LV e' medial:    4.57 cm/s LV PW:         1.20 cm LV E/e' medial:  30.4 LV IVS:        0.70 cm LV e' lateral:   4.86 cm/s                        LV E/e' lateral: 28.6  RIGHT VENTRICLE             IVC RV Basal diam:  5.20 cm     IVC diam: 2.10 cm RV S prime:     10.09 cm/s TAPSE (M-mode): 2.2 cm LEFT ATRIUM             Index        RIGHT ATRIUM           Index LA diam:  5.80 cm 2.85 cm/m   RA Area:     18.30 cm LA Vol (A2C):   99.2 ml 48.69 ml/m  RA Volume:   53.50 ml  26.26 ml/m LA Vol (A4C):   79.4 ml 38.97 ml/m LA Biplane Vol: 90.8 ml 44.56 ml/m  AORTIC VALVE AV Vmax:           452.00 cm/s AV Vmean:          331.600 cm/s AV VTI:            0.942 m AV Peak Grad:      81.7 mmHg AV Mean Grad:      54.0 mmHg LVOT Vmax:         56.93 cm/s LVOT Vmean:        38.600 cm/s LVOT VTI:          0.108 m LVOT/AV VTI ratio: 0.12 AI PHT:            199 msec  AORTA Ao Root diam: 2.90 cm MITRAL VALVE MV Area (PHT): 4.26 cm     SHUNTS MV Decel Time: 178 msec     Systemic VTI: 0.11 m MR Peak grad: 145.7 mmHg MR Mean grad: 85.5 mmHg MR Vmax:      603.50 cm/s MR Vmean:     430.0 cm/s MV E velocity: 139.00 cm/s MV A velocity: 73.85 cm/s MV E/A ratio:  1.88 Caron Poser Electronically signed by Caron Poser Signature Date/Time: 02/02/2024/9:07:08 AM    Final    DG Chest 2 View Result Date: 02/01/2024 EXAM: 2 VIEW(S) XRAY OF THE CHEST 02/01/2024 09:25:00 AM COMPARISON: 01/19/2024 CLINICAL HISTORY: 83 year old male with persistent shortness of breath for 1  month. FINDINGS: LUNGS AND PLEURA: Vascular congestion/mild interstitial pulmonary edema. Small to moderate bilateral pleural effusions. Bibasilar airspace opacities. No pneumothorax. No significant change since 01/19/2024. HEART AND MEDIASTINUM: Cardiomegaly. Prosthetic aortic valve. BONES AND SOFT TISSUES: Prior median sternotomy. No acute osseous abnormality. IMPRESSION: 1. Pulmonary vascular congestion with bilateral pleural effusions not significantly changed since December/11/2023 2. No new cardiopulmonary abnormality. Electronically signed by: Helayne Hurst MD 02/01/2024 10:44 AM EST RP Workstation: [id number]        Laneta Blunt, DO [facility] 02/03/2024, 4:23 PM    Dictation software may have been used to generate the above note. Typos may occur and escape review in typed/dictated notes. Please contact Dr Blunt directly for clarity if needed.  Staff may message me via secure chat in Epic  but this may not receive an immediate response,  please page me for urgent matters!  If 7PM-7AM, please contact night coverage [email or web address]       "

## 2024-02-03 NOTE — H&P (View-Only) (Signed)
 "  Cardiology Progress Note   Patient Name: Jacob Montes Date of Encounter: 02/03/2024  Primary Cardiologist: Evalene Lunger, MD  Subjective   Slept ok - part-time in bed, part-time in chair.  Didn't feel orthopneic.  Has ambulated some in room and breathing slightly better.  Notes poor response to IV lasix  last night - only about 150 ml out since then.  Still w/ significant bilat LEE.  Has had a cough at home and now bringing up thick, clear sputum. Objective   Inpatient Medications    Scheduled Meds:  amLODipine   10 mg Oral Daily   aspirin   81 mg Oral Daily   enoxaparin  (LOVENOX ) injection  45 mg Subcutaneous Q24H   metoprolol  succinate  25 mg Oral Daily   multivitamin with minerals  1 tablet Oral Daily   pantoprazole   40 mg Oral BID AC   potassium chloride  SA  20 mEq Oral Daily   rosuvastatin   40 mg Oral Daily   Continuous Infusions:  PRN Meds: acetaminophen  **OR** acetaminophen , montelukast , morphine  injection, ondansetron  **OR** [DISCONTINUED] ondansetron  (ZOFRAN ) IV, oxyCODONE , polyethylene glycol   Vital Signs    Vitals:   02/03/24 0059 02/03/24 0358 02/03/24 0424 02/03/24 0500  BP:  (!) 124/46    Pulse:  68    Resp: 20 19  16   Temp:  98.6 F (37 C)    TempSrc:  Oral    SpO2:  95%    Weight:   95.6 kg     Intake/Output Summary (Last 24 hours) at 02/03/2024 0714 Last data filed at 02/02/2024 2100 Gross per 24 hour  Intake --  Output 550 ml  Net -550 ml   Filed Weights   02/03/24 0424  Weight: 95.6 kg    Physical Exam   GEN: Well nourished, well developed, in no acute distress.  HEENT: Grossly normal.  Neck: Supple, mod elev JVP, no carotid bruits or masses. Cardiac: RRR, 2/6 SEM loudest @ the upper sternal borders (heard throughout) w/ 2/6 diast murmur @ LLSB.  No rubs or gallops. No clubbing, cyanosis, 2+ bilat LE edema.  Radials 2+, DP/PT 2+ and equal bilaterally.  Respiratory:  Respirations regular and unlabored, markedly diminished @ bilat bases  ~ 1/3 way up.  Otw CTA. GI: Obese, soft, nontender, nondistended, BS + x 4. MS: no deformity or atrophy. Skin: warm and dry, no rash. Neuro:  Strength and sensation are intact. Psych: AAOx3.  Normal affect.  Labs    Chemistry Recent Labs  Lab 02/01/24 1042 02/02/24 0819 02/03/24 0400  NA 139 140 140  K 4.6 4.1 4.4  CL 101 100 99  CO2 31 33* 31  GLUCOSE 93 87 94  BUN 25* 27* 31*  CREATININE 0.87 0.91 0.99  CALCIUM  9.3 9.4 9.2  PROT  --  6.2*  --   ALBUMIN  --  3.7  --   AST  --  31  --   ALT  --  17  --   ALKPHOS  --  78  --   BILITOT  --  1.4*  --   GFRNONAA >60 >60 >60  ANIONGAP 8 7 10      Hematology Recent Labs  Lab 02/01/24 1042 02/02/24 0819  WBC 5.4 4.9  RBC 3.96* 4.03*  HGB 12.9* 13.0  HCT 38.6* 39.7  MCV 97.5 98.5  MCH 32.6 32.3  MCHC 33.4 32.7  RDW 14.6 14.6  PLT 133* 142*    Cardiac Enzymes  Recent Labs  Lab 01/19/24 1019  01/19/24 1234 01/19/24 1649 01/19/24 1842 02/01/24 1042  TRNPT 99* 117* 101* 108* 30*  ]  ProBNP    Component Value Date/Time   PROBNP 2,213.0 (H) 02/01/2024 1042   Lipids  Lab Results  Component Value Date   CHOL 133 10/07/2021   HDL 43 10/07/2021   LDLCALC 67 10/07/2021   TRIG 133 10/07/2021   CHOLHDL 3.1 10/07/2021    HbA1c  Lab Results  Component Value Date   HGBA1C 5.9 (H) 10/07/2021    Radiology    DG Chest 2 View Result Date: 02/01/2024 EXAM: 2 VIEW(S) XRAY OF THE CHEST 02/01/2024 09:25:00 AM COMPARISON: 01/19/2024 CLINICAL HISTORY: 83 year old male with persistent shortness of breath for 1 month. FINDINGS: LUNGS AND PLEURA: Vascular congestion/mild interstitial pulmonary edema. Small to moderate bilateral pleural effusions. Bibasilar airspace opacities. No pneumothorax. No significant change since 01/19/2024. HEART AND MEDIASTINUM: Cardiomegaly. Prosthetic aortic valve. BONES AND SOFT TISSUES: Prior median sternotomy. No acute osseous abnormality. IMPRESSION: 1. Pulmonary vascular congestion with  bilateral pleural effusions not significantly changed since December/11/2023 2. No new cardiopulmonary abnormality. Electronically signed by: Helayne Hurst MD 02/01/2024 10:44 AM EST RP Workstation: [id number]     Telemetry    RSR, mid-60's to low-70's, occas PVC/ventricular couplet - Personally Reviewed  Cardiac Studies   2D Echocardiogram 12.23.2025  1. Left ventricular ejection fraction, by estimation, is 50 to 55%. The  left ventricle has low normal function. The left ventricle has no regional  wall motion abnormalities. The left ventricular internal cavity size was  mildly dilated. Left ventricular  diastolic parameters are consistent with Grade II diastolic dysfunction  (pseudonormalization).   2. Right ventricular systolic function was not well visualized. The right  ventricular size is not well visualized.   3. Left atrial size was moderately dilated.   4. Right atrial size was mildly dilated.   5. The mitral valve is degenerative. Mild mitral valve regurgitation.  Mild calcific mitral stenosis. Severe mitral annular calcification.   6. There is a 23 mm Sapien prosthetic (TAVR) valve present in the aortic  position. Procedure Date: 01/19/2017. Echo findings are consistent with  severe stenosis and moderate perivalvular leak of the aortic prosthesis.  Aortic valve mean gradient measures   54.0 mmHg. Aortic valve Vmax measures 4.52 m/s. DVI 0.13.   7. The inferior vena cava is dilated in size with >50% respiratory  variability, suggesting right atrial pressure of 8 mmHg.  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  Assessment & Plan    1.  Acute hypoxemic respiratory failure/acute on chronic HFpEF/Bilateral Pleural effusions: Patient admitted for the second time in the month of December due to progressive dyspnea on exertion and volume overload in the setting of progressive bioprosthetic aortic valve stenosis.  Saturating in the 80s at office visit on December 23.  proBNP on admission 2213 and chest x-ray showing pulmonary vascular congestion and bilateral pleural effusions (small to mod - unchanged from  earlier this month).  Echo this admission with an EF of 50 to 55% and grade 2 diastolic dysfunction, with presence of severe aortic stenosis and mean aortic valve gradient of 54 mmHg.  He has been on furosemide  40 mg IV twice daily.  I's and O's are inaccurate as he spent much of the day yesterday in the emergency room.  He is listed at 95.6 kg this morning, which is 1.4 kg above his prior discharge weight.  He notes that he's only put out about 150 ml of dark urine since his PM dose of lasix  last night.  Persistent 2+ bilat LEE to knees.  Markedly diminished breath sounds @ bilat bases.  Renal fxn relatively stable w/ only slight rise in BUN/Creat to 31/0.99.  Bicarb stable @ 33.  Lasix  held this AM.  Will obtain decubitus films to better assess effusions.  R & L heart cath planned tomorrow which will assist in guiding diuresis.  With chronic lower ext edema, will reduce amlodipine  to 5 mg daily as this is likely contributing.  May need to wean off completely and add alternate agent (ARB).  Adding TEDS.  2.  Severe Bioprosthetic Aortic Valve Stenosis:  s/p TAVR w/ 23mm Sapien valve in [place], [place] in 01/2017.  Noted in outpt setting to have progressive AoV  stenosis complicated by progressive dyspnea, volume overload, and now his second admission this month due to heart failure.  Echo this admission w/ mean AoV gradient of 54 mmHg consistent with severe stenosis and moderate perivalvular leak.  Discussed with structural heart team in [place]. Breathing sl better w/ diuresis - held this AM.  Plan for right and left heart cardiac catheterization tomorrow morning followed by transfer to [facility] over the weekend for structural heart team eval and CT imaging of AoV to r/o thombosis.  Pt and wife aware of plan and are in agreement. Informed Consent   Shared Decision Making/Informed Consent The risks, including but not limited to, [bleeding or vascular complications (1 in 500), pneumothorax (1 in 1600), arrhythmia (1 in 1000) and death (1 in 5000)], benefits (diagnostic support and/or management of heart failure, pulmonary hypertension) and alternatives of a right heart catheterization were discussed in detail with Mr. Scheff and he is willing to proceed. The risks [stroke (1 in 1000), death (1 in 1000), kidney failure [usually temporary] (1 in 500), bleeding (1 in 200), allergic reaction [possibly serious] (1 in 200)], benefits (diagnostic support and management of coronary artery disease) and alternatives of a cardiac catheterization were discussed in detail with Mr. Stipes and he is willing to proceed.      3.  CAD/demand ischemia: History of CABG of unknown number of vessels in 2009.  He had a low risk Myoview  in November 2025.  He has not been having chest pain but in the setting of heart failure admissions, troponin up to 117 on December 10, and less significantly elevated this admission, at 30.  Echo with an EF of 50-55% without regional wall motion abnormalities.  As outlined above, plan for right and left heart cardiac catheterization tomorrow.  Continue aspirin , beta-blocker, and statin therapy.  4.  Primary hypertension: Stable this morning.  Continue  beta-blocker.  Reducing amlodipine  to 5mg  in setting of chronic lower ext edema - may be clouding picture.  Consider weaning ccb further and adding ARB.  5.  Hyperlipidemia: LDL of 76 in October 2025.  LFTs normal this admission.  Currently on rosuvastatin  20 mg.  I will escalate to 40  mg daily given LDL not at goal.  Will need to watch for myalgias as he did not tolerate atorvastatin in the past.  Signed, Lonni Meager, NP  02/03/2024, 7:14 AM    For questions or updates, please contact   Please consult [email or web address] for contact info under Cardiology/STEMI.  "

## 2024-02-04 ENCOUNTER — Encounter (HOSPITAL_COMMUNITY): Payer: Self-pay

## 2024-02-04 ENCOUNTER — Encounter: Payer: Self-pay | Admitting: Nurse Practitioner

## 2024-02-04 ENCOUNTER — Encounter
Admission: EM | Disposition: A | Discharge status: Other Acute Inpatient Hospital | Payer: Self-pay | Source: Ambulatory Visit | Attending: Osteopathic Medicine

## 2024-02-04 DIAGNOSIS — I359 Nonrheumatic aortic valve disorder, unspecified: Secondary | ICD-10-CM | POA: Diagnosis not present

## 2024-02-04 DIAGNOSIS — I2510 Atherosclerotic heart disease of native coronary artery without angina pectoris: Secondary | ICD-10-CM | POA: Diagnosis not present

## 2024-02-04 DIAGNOSIS — I5033 Acute on chronic diastolic (congestive) heart failure: Secondary | ICD-10-CM | POA: Diagnosis not present

## 2024-02-04 HISTORY — PX: RIGHT/LEFT HEART CATH AND CORONARY/GRAFT ANGIOGRAPHY: CATH118267

## 2024-02-04 LAB — POCT I-STAT EG7
Acid-Base Excess: 8 mmol/L — ABNORMAL HIGH (ref 0.0–2.0)
Bicarbonate: 34.7 mmol/L — ABNORMAL HIGH (ref 20.0–28.0)
Calcium, Ion: 1.2 mmol/L (ref 1.15–1.40)
HCT: 35 % — ABNORMAL LOW (ref 39.0–52.0)
Hemoglobin: 11.9 g/dL — ABNORMAL LOW (ref 13.0–17.0)
O2 Saturation: 66 %
Potassium: 3.6 mmol/L (ref 3.5–5.1)
Sodium: 139 mmol/L (ref 135–145)
TCO2: 36 mmol/L — ABNORMAL HIGH (ref 22–32)
pCO2, Ven: 56 mmHg (ref 44–60)
pH, Ven: 7.4 (ref 7.25–7.43)
pO2, Ven: 35 mmHg (ref 32–45)

## 2024-02-04 LAB — POCT I-STAT 7, (LYTES, BLD GAS, ICA,H+H)
Acid-Base Excess: 8 mmol/L — ABNORMAL HIGH (ref 0.0–2.0)
Bicarbonate: 33.8 mmol/L — ABNORMAL HIGH (ref 20.0–28.0)
Calcium, Ion: 1.19 mmol/L (ref 1.15–1.40)
HCT: 34 % — ABNORMAL LOW (ref 39.0–52.0)
Hemoglobin: 11.6 g/dL — ABNORMAL LOW (ref 13.0–17.0)
O2 Saturation: 93 %
Potassium: 3.7 mmol/L (ref 3.5–5.1)
Sodium: 138 mmol/L (ref 135–145)
TCO2: 35 mmol/L — ABNORMAL HIGH (ref 22–32)
pCO2 arterial: 52.7 mmHg — ABNORMAL HIGH (ref 32–48)
pH, Arterial: 7.415 (ref 7.35–7.45)
pO2, Arterial: 67 mmHg — ABNORMAL LOW (ref 83–108)

## 2024-02-04 LAB — BASIC METABOLIC PANEL WITH GFR
Anion gap: 8 (ref 5–15)
BUN: 31 mg/dL — ABNORMAL HIGH (ref 8–23)
CO2: 33 mmol/L — ABNORMAL HIGH (ref 22–32)
Calcium: 9 mg/dL (ref 8.9–10.3)
Chloride: 99 mmol/L (ref 98–111)
Creatinine, Ser: 1.05 mg/dL (ref 0.61–1.24)
GFR, Estimated: >60 mL/min
Glucose, Bld: 78 mg/dL (ref 70–99)
Potassium: 3.7 mmol/L (ref 3.5–5.1)
Sodium: 140 mmol/L (ref 135–145)

## 2024-02-04 LAB — HEPARIN LEVEL (UNFRACTIONATED): Heparin Unfractionated: 0.35 [IU]/mL (ref 0.30–0.70)

## 2024-02-04 SURGERY — RIGHT/LEFT HEART CATH AND CORONARY/GRAFT ANGIOGRAPHY
Anesthesia: Moderate Sedation

## 2024-02-04 MED ORDER — LIDOCAINE HCL 1 % IJ SOLN
INTRAMUSCULAR | Status: AC
  Filled 2024-02-04: qty 20

## 2024-02-04 MED ORDER — HEPARIN (PORCINE) IN NACL 2000-0.9 UNIT/L-% IV SOLN
INTRAVENOUS | Status: DC | PRN
  Administered 2024-02-04: 1000 mL

## 2024-02-04 MED ORDER — VERAPAMIL HCL 2.5 MG/ML IV SOLN
INTRAVENOUS | Status: DC | PRN
  Administered 2024-02-04: 2.5 mg via INTRAVENOUS

## 2024-02-04 MED ORDER — MIDAZOLAM HCL 2 MG/2ML IJ SOLN
INTRAMUSCULAR | Status: AC
  Filled 2024-02-04: qty 2

## 2024-02-04 MED ORDER — HEPARIN SODIUM (PORCINE) 1000 UNIT/ML IJ SOLN
INTRAMUSCULAR | Status: DC | PRN
  Administered 2024-02-04: 4500 [IU] via INTRAVENOUS

## 2024-02-04 MED ORDER — HEPARIN SODIUM (PORCINE) 1000 UNIT/ML IJ SOLN
INTRAMUSCULAR | Status: AC
  Filled 2024-02-04: qty 10

## 2024-02-04 MED ORDER — FUROSEMIDE 10 MG/ML IJ SOLN
80.0000 mg | Freq: Two times a day (BID) | INTRAMUSCULAR | Status: AC
  Administered 2024-02-04: 80 mg via INTRAVENOUS
  Filled 2024-02-04: qty 8

## 2024-02-04 MED ORDER — SODIUM CHLORIDE 0.9 % IV SOLN: 250.0000 mL | INTRAVENOUS | Status: AC | PRN

## 2024-02-04 MED ORDER — FUROSEMIDE 10 MG/ML IJ SOLN
INTRAMUSCULAR | Status: AC
  Administered 2024-02-04: 80 mg via INTRAVENOUS
  Filled 2024-02-04: qty 8

## 2024-02-04 MED ORDER — FUROSEMIDE 10 MG/ML IJ SOLN: 60.0000 mg | Freq: Two times a day (BID) | INTRAMUSCULAR | Status: DC

## 2024-02-04 MED ORDER — SODIUM CHLORIDE 0.9% FLUSH: 3.0000 mL | INTRAVENOUS | Status: DC | PRN

## 2024-02-04 MED ORDER — HEPARIN (PORCINE) 25000 UT/250ML-% IV SOLN
1100.0000 [IU]/h | INTRAVENOUS | Status: DC
  Administered 2024-02-04 – 2024-02-06 (×3): 1100 [IU]/h via INTRAVENOUS
  Filled 2024-02-04 (×3): qty 250

## 2024-02-04 MED ORDER — SODIUM CHLORIDE 0.9% FLUSH
3.0000 mL | Freq: Two times a day (BID) | INTRAVENOUS | Status: DC
  Administered 2024-02-04 – 2024-02-06 (×5): 3 mL via INTRAVENOUS

## 2024-02-04 MED ORDER — HEPARIN (PORCINE) IN NACL 1000-0.9 UT/500ML-% IV SOLN
INTRAVENOUS | Status: AC
  Filled 2024-02-04: qty 1000

## 2024-02-04 MED ORDER — FREE WATER
250.0000 mL | Freq: Once | Status: AC
  Administered 2024-02-04: 250 mL via ORAL

## 2024-02-04 MED ORDER — VERAPAMIL HCL 2.5 MG/ML IV SOLN
INTRAVENOUS | Status: AC
  Filled 2024-02-04: qty 2

## 2024-02-04 MED ORDER — ONDANSETRON HCL 4 MG/2ML IJ SOLN: 4.0000 mg | Freq: Four times a day (QID) | INTRAMUSCULAR | Status: DC | PRN

## 2024-02-04 MED ORDER — LIDOCAINE HCL (PF) 1 % IJ SOLN
INTRAMUSCULAR | Status: DC | PRN
  Administered 2024-02-04 (×2): 2 mL

## 2024-02-04 MED ORDER — FENTANYL CITRATE (PF) 100 MCG/2ML IJ SOLN
INTRAMUSCULAR | Status: AC
  Filled 2024-02-04: qty 2

## 2024-02-04 MED ORDER — IOHEXOL 300 MG/ML  SOLN
INTRAMUSCULAR | Status: DC | PRN
  Administered 2024-02-04: 77 mL

## 2024-02-04 SURGICAL SUPPLY — 12 items
CATH 5F 110X4 TIG (CATHETERS) IMPLANT
CATH BALLN WEDGE 5F 110CM (CATHETERS) IMPLANT
CATH INFINITI 5 FR IM (CATHETERS) IMPLANT
CATH INFINITI JR4 5F (CATHETERS) IMPLANT
DEVICE RAD COMP TR BAND LRG (VASCULAR PRODUCTS) IMPLANT
DRAPE BRACHIAL (DRAPES) IMPLANT
GLIDESHEATH SLEND SS 6F .021 (SHEATH) IMPLANT
GUIDEWIRE INQWIRE 1.5J.035X260 (WIRE) IMPLANT
PACK CARDIAC CATH (CUSTOM PROCEDURE TRAY) ×1 IMPLANT
SET ATX-X65L (MISCELLANEOUS) IMPLANT
SHEATH GLIDE SLENDER 4/5FR (SHEATH) IMPLANT
STATION PROTECTION PRESSURIZED (MISCELLANEOUS) IMPLANT

## 2024-02-04 NOTE — Progress Notes (Signed)
 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   Allergies[1]  Patient Measurements: Height: 5' 6 (167.6 cm) Weight: (P) 95 kg (209 lb 7 oz) IBW/kg (Calculated) : 63.8  Vital Signs: Temp: 98.2 F (36.8 C) (12/26 1014) Temp Source: Temporal (12/26 1014) BP: 114/46 (12/26 1045) Pulse Rate: 86 (12/26 1045)  Labs: Recent Labs    02/02/24 0819 02/03/24 0400 02/04/24 0439 02/04/24 0927 02/04/24 0931  HGB 13.0  --   --  11.6* 11.9*  HCT 39.7  --   --  34.0* 35.0*  PLT 142*  --   --   --   --   LABPROT 15.2  --   --   --   --   INR 1.1  --   --   --   --   CREATININE 0.91 0.99 1.05  --   --     Estimated Creatinine Clearance: 57.7 mL/min (by C-G formula based on SCr of 1.05 mg/dL).   Medical History: Past Medical History:  Diagnosis Date   Actinic keratosis    Acute exacerbation of CHF (congestive heart failure) (HCC) 01/19/2024   Aortic stenosis s/p TAVR    a. 01/2017 s/p TAVR - 23mm Sapien; b. 11/2023 Echo: EF 50-55%, GrII DD, nl RV fxn, mild-mod MR, mild Ca2+ MS, Mod AS, AVA 0.84, mean gradient 32 mmHg, mild perivalvular leak; c. 01/2024 Echo: EF 50-55%, no rwma, GrII DD, nl RV fxn, mod dil LA, mildly dil RA, mild MR/MS, sev AS w/ mod perivalvular leak - mean grad , Vmax 4.52 m/s, DVI 0.13.   CAD (coronary artery disease)    a. 07/2007 s/p CABG x 3 [facility], [place]); b. 12/2023 MV: No isch/infarct; c. 01/2024 Cath: LM 95ost/m, LAD 130m, LCX 100ost/p/m, OM3 40, RCA small, 95p, 78m, RPDA fills via collats from LAD. VG->OM1 nl, VG->OM3 nl, LIMA->LAD nl.   Cancer (HCC)    Skin   Carotid artery disease    a. 2009 s/p left sided CEA complicated by RLN parlaysis; b. 02/2023 U/S: no significant dzs.   Cataract    Chronic heart failure with preserved ejection fraction (HFpEF) (HCC)    a. 01/2024 Echo: EF 50-55%, GrII DD, nl RV fxn, mod dil LA, mild MR/MS, severe AS, mod perivalvular leak; b. 01/2024 RHC: RA  3, RV 66/2, PA 55/17 (30), PCWP 20.   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   GERD (gastroesophageal reflux disease)    HLD (hyperlipidemia)    Hyperlipidemia 10/15/2015   Hypertension    Nephrolithiasis 11/16/2018   Pulmonary embolism (HCC) 08/2010   Skin cancer    Sleep apnea    Stroke (HCC)     Medications:  Scheduled:   [MAR Hold] aspirin   81 mg Oral Daily   furosemide   80 mg Intravenous BID   [MAR Hold] metoprolol  succinate  25 mg Oral Daily   [MAR Hold] multivitamin with minerals  1 tablet Oral Daily   [MAR Hold] pantoprazole   40 mg Oral BID AC   [MAR Hold] potassium chloride  SA  20 mEq Oral Daily   [MAR Hold] rosuvastatin   40 mg Oral Daily   sodium chloride  flush  3 mL Intravenous Q12H   Infusions:   sodium chloride      PRN: sodium chloride , [MAR Hold] acetaminophen  **OR** [MAR Hold] acetaminophen , [MAR Hold] montelukast , [MAR Hold]  morphine  injection, ondansetron  (ZOFRAN ) IV, [MAR Hold] ondansetron  **OR** [DISCONTINUED] ondansetron  (ZOFRAN ) IV, [MAR Hold] oxyCODONE , [  MAR Hold] polyethylene glycol, sodium chloride  flush  Assessment: 83 y.o. 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 Cardiology orders are to begin IV heparin  2 hours post radial band removal  (removal at 1115)  Goal of Therapy:  anti-Xa level 0.3-0.7 units/ml Monitor platelets by anticoagulation protocol: Yes   Plan:   --Start heparin  infusion at 1100 units/hr beginning at 1315 --Check anti-Xa level in 8 hours after initiation --Continue to monitor H&H and platelets  Adriana JONETTA Bolster 02/04/2024,11:16 AM      [1]   Allergies Allergen Reactions   Atorvastatin Other (See Comments)    aches  myalgia  myalgia    aches  myalgia   Ramipril Other (See Comments) and Cough    cough

## 2024-02-04 NOTE — Interval H&P Note (Signed)
 History and Physical Interval Note:  02/04/2024 9:07 AM  Jacob Montes  has presented today for surgery, with the diagnosis of HFpEF; Severe Aortic Stenosis.  The various methods of treatment have been discussed with the patient and family. After consideration of risks, benefits and other options for treatment, the patient has consented to  Procedures: RIGHT/LEFT HEART CATH AND CORONARY/GRAFT ANGIOGRAPHY (N/A) as a surgical intervention.  The patient's history has been reviewed, patient examined, no change in status, stable for surgery.  I have reviewed the patient's chart and labs.  Questions were answered to the patient's satisfaction.     [name]

## 2024-02-04 NOTE — Plan of Care (Signed)
   Problem: Education: Goal: Knowledge of General Education information will improve Description Including pain rating scale, medication(s)/side effects and non-pharmacologic comfort measures Outcome: Progressing

## 2024-02-04 NOTE — Progress Notes (Signed)
 "  Cardiology Progress Note   Patient Name: Jacob Montes Date of Encounter: 02/04/2024  Primary Cardiologist: Evalene Lunger, MD  Subjective   Orthopneic overnight.  Coughing frequently - up and down, spent some time sitting up in chair.  No chest pain.  Didn't really notice much more UO yesterday. Objective   Inpatient Medications    Scheduled Meds:  amLODipine   5 mg Oral Daily   aspirin   81 mg Oral Daily   enoxaparin  (LOVENOX ) injection  45 mg Subcutaneous Q24H   metoprolol  succinate  25 mg Oral Daily   multivitamin with minerals  1 tablet Oral Daily   pantoprazole   40 mg Oral BID AC   potassium chloride  SA  20 mEq Oral Daily   rosuvastatin   40 mg Oral Daily   Continuous Infusions:  PRN Meds: acetaminophen  **OR** acetaminophen , montelukast , morphine  injection, ondansetron  **OR** [DISCONTINUED] ondansetron  (ZOFRAN ) IV, oxyCODONE , polyethylene glycol   Vital Signs    Vitals:   02/03/24 2359 02/04/24 0214 02/04/24 0342 02/04/24 0530  BP: (!) 104/48  (!) 99/31 (!) 109/44  Pulse: 74  75 65  Resp: 18  17 18   Temp:   98.8 F (37.1 C)   TempSrc:      SpO2: 92%  94% 94%  Weight:  (P) 95 kg    Height:        Intake/Output Summary (Last 24 hours) at 02/04/2024 0748 Last data filed at 02/04/2024 0521 Gross per 24 hour  Intake 490 ml  Output 1275 ml  Net -785 ml   Filed Weights   02/03/24 0424 02/04/24 0214  Weight: 95.6 kg (P) 95 kg    Physical Exam   GEN: Obese, in no acute distress.  HEENT: Grossly normal.  Neck: Supple, JVD to jaw, no carotid bruits or masses. Cardiac: RRR, 2/6 SEM throughout, loudest @ the upper sternal borders, 2/6 diastolic murmur @ LLSB, no rubs or gallops. No clubbing, cyanosis, 3+ bilat LE edema to knees.  Radials 2+, DP/PT 2+ and equal bilaterally.  Respiratory:  Respirations regular and unlabored, coarse breath sounds with crackles 1/2 way up bilaterally. GI: obese, soft, nontender, nondistended, BS + x 4. MS: no deformity or  atrophy. Skin: warm and dry, no rash. Neuro:  Strength and sensation are intact. Psych: AAOx3.  Normal affect.  Labs    Chemistry Recent Labs  Lab 02/02/24 0819 02/03/24 0400 02/04/24 0439  NA 140 140 140  K 4.1 4.4 3.7  CL 100 99 99  CO2 33* 31 33*  GLUCOSE 87 94 78  BUN 27* 31* 31*  CREATININE 0.91 0.99 1.05  CALCIUM  9.4 9.2 9.0  PROT 6.2*  --   --   ALBUMIN 3.7  --   --   AST 31  --   --   ALT 17  --   --   ALKPHOS 78  --   --   BILITOT 1.4*  --   --   GFRNONAA >60 >60 >60  ANIONGAP 7 10 8      Hematology Recent Labs  Lab 02/01/24 1042 02/02/24 0819  WBC 5.4 4.9  RBC 3.96* 4.03*  HGB 12.9* 13.0  HCT 38.6* 39.7  MCV 97.5 98.5  MCH 32.6 32.3  MCHC 33.4 32.7  RDW 14.6 14.6  PLT 133* 142*    Cardiac Enzymes  Recent Labs  Lab 01/19/24 1019 01/19/24 1234 01/19/24 1649 01/19/24 1842 02/01/24 1042  TRNPT 99* 117* 101* 108* 30*  ]  ProBNP    Component Value  Date/Time   PROBNP 2,213.0 (H) 02/01/2024 1042   Lipids  Lab Results  Component Value Date   CHOL 133 10/07/2021   HDL 43 10/07/2021   LDLCALC 67 10/07/2021   TRIG 133 10/07/2021   CHOLHDL 3.1 10/07/2021    HbA1c  Lab Results  Component Value Date   HGBA1C 5.9 (H) 10/07/2021    Radiology    DG Chest Bilateral Decubitus Result Date: 02/04/2024 CLINICAL DATA:  Pleural effusion. EXAM: CHEST - BILATERAL DECUBITUS VIEW COMPARISON:  02/01/2024 FINDINGS: Right-side-down decubitus film shows small layering right-sided pleural effusion. Left-side-down decubitus film shows moderate layering left pleural effusion. Basilar atelectasis or infiltrate again noted. The cardio pericardial silhouette is enlarged. IMPRESSION: Small right and moderate left layering pleural effusions. Electronically Signed   By: Camellia Candle M.D.   On: 02/04/2024 06:15   DG Chest 2 View Result Date: 02/01/2024 EXAM: 2 VIEW(S) XRAY OF THE CHEST 02/01/2024 09:25:00 AM COMPARISON: 01/19/2024 CLINICAL HISTORY: 83 year old  male with persistent shortness of breath for 1 month. FINDINGS: LUNGS AND PLEURA: Vascular congestion/mild interstitial pulmonary edema. Small to moderate bilateral pleural effusions. Bibasilar airspace opacities. No pneumothorax. No significant change since 01/19/2024. HEART AND MEDIASTINUM: Cardiomegaly. Prosthetic aortic valve. BONES AND SOFT TISSUES: Prior median sternotomy. No acute osseous abnormality. IMPRESSION: 1. Pulmonary vascular congestion with bilateral pleural effusions not significantly changed since December/11/2023 2. No new cardiopulmonary abnormality. Electronically signed by: Helayne Hurst MD 02/01/2024 10:44 AM EST RP Workstation: [id number]     Telemetry    RSR, occas PVCs, couplets, up to 3 beats NSVT - Personally Reviewed  Cardiac Studies   2D Echocardiogram 12.23.2025   1. Left ventricular ejection fraction, by estimation, is 50 to 55%. The  left ventricle has low normal function. The left ventricle has no regional  wall motion abnormalities. The left ventricular internal cavity size was  mildly dilated. Left ventricular  diastolic parameters are consistent with Grade II diastolic dysfunction  (pseudonormalization).   2. Right ventricular systolic function was not well visualized. The right  ventricular size is not well visualized.   3. Left atrial size was moderately dilated.   4. Right atrial size was mildly dilated.   5. The mitral valve is degenerative. Mild mitral valve regurgitation.  Mild calcific mitral stenosis. Severe mitral annular calcification.   6. There is a 23 mm Sapien prosthetic (TAVR) valve present in the aortic  position. Procedure Date: 01/19/2017. Echo findings are consistent with  severe stenosis and moderate perivalvular leak of the aortic prosthesis.  Aortic valve mean gradient measures   54.0 mmHg. Aortic valve Vmax measures 4.52 m/s. DVI 0.13.   7. The inferior vena cava is dilated in size with >50% respiratory  variability, suggesting  right atrial pressure of 8 mmHg.  Patient Profile     83 year old male with a history of CAD status post CABG in June 2009, severe aortic stenosis status post TAVR in December 2018, hypertension, hyperlipidemia, atorvastatin intolerance (ok on rosvua), stroke, carotid stenosis status post left carotid endarterectomy in 2009 complicated by right laryngeal nerve paralysis, HFpEF, nephrolithiasis, remote tobacco abuse, sleep apnea, and remote PE (08/2010), who was readmitted 12/23 in the setting of volume overload, progressive dyspnea, and progressive bioprosthetic AoV stenosis (mean gradient 11/2018  07/2021  11/2023  01/2024; Vmax 3.6 -> 4.5, DVI 0.26 -> 0.13).   Assessment & Plan    1.  Acute hypoxemic respiratory failure/acute on chronic HFpEF/Bilateral Pleural effusions: Patient admitted for the second time in the  month of December due to progressive dyspnea on exertion and volume overload in the setting of progressive bioprosthetic aortic valve stenosis.  Saturating in the 80s at office visit on December 23.  proBNP on admission 2213 and chest x-ray showing pulmonary vascular congestion and bilateral pleural effusions (small to mod - unchanged from earlier this month).  Echo this admission with an EF of 50 to 55% and grade 2 diastolic dysfunction, with presence of severe aortic stenosis and mean aortic valve gradient of 54 mmHg.  Volume up yesterday and lasix  increased to 80 IV BID.  -785 mL yesterday with weight down 0.6 kg this morning.  BUN and creatinine are stable at 31/1.05 respectively.  Bicarb mildly elevated but overall stable at 33.  Still markedly volume overloaded w/ crackles/rhonchi and cough and will require additional IV diuresis.  R & L heart cath today if pt is able to lie flat.  Pending results, will look to diurese today/tomorrow and transfer to [facility] on Sunday.  Pressures soft.  D/c amlodipine .  2.  Severe Bioprosthetic Aortic Valve Stenosis:  s/p TAVR w/  23mm Sapien valve in [place], [place] in 01/2017.  Noted in outpt setting to have progressive AoV stenosis complicated by progressive dyspnea, volume overload, and now his second admission this month due to heart failure.  Echo this admission w/ mean AoV gradient of 54 mmHg consistent with severe stenosis and moderate perivalvular leak.  Volume overloaded as outlined above.  Attempt @ R/L heart cath today.  Continue diuresis based on findings.  Likely transfer to [facility] on Sunday for structural heart eval and CT imaging of AoV to r/o thrombosis.  Add heparin  post-cath.  3.  CAD/demand ischemia: History of CABG of unknown number of vessels in 2009.  He had a low risk Myoview  in November 2025.  He has not been having chest pain but in the setting of heart failure admissions, troponin up to 117 on December 10, and less significantly elevated this admission, at 30.  Echo with an EF of 50-55% without regional wall motion abnormalities.  As outlined above, plan for right and left heart cardiac catheterization today.  Continue aspirin , beta-blocker, and statin therapy.   4.  Primary hypertension: Pressures softer over past 24 hrs.  D/c amlodipine  as this is likely contributing to lower ext edema anyway.  Continue beta-blocker.     5.  Hyperlipidemia: LDL of 76 in October 2025.  LFTs normal this admission.  Currently on rosuvastatin  20 mg.  I will escalate to 40 mg daily given LDL not at goal.  Will need to watch for myalgias as he did not tolerate atorvastatin in the past.  Signed, Lonni Meager, NP  02/04/2024, 7:48 AM    For questions or updates, please contact   Please consult [email or web address] for contact info under Cardiology/STEMI.  "

## 2024-02-04 NOTE — Progress Notes (Signed)
 Dr. Darron made aware of pt's wet cough and orthopnea. MD at bedside assessing pt at this time

## 2024-02-04 NOTE — Care Management Important Message (Signed)
 Important Message  Patient Details  Name: Jacob Montes MRN: [id number] Date of Birth: 11/17/1940   Important Message Given:  Yes - Medicare IM     [name] 02/04/2024, 4:48 PM

## 2024-02-04 NOTE — Progress Notes (Signed)
 " PROGRESS NOTE    Jacob Montes   FMW:[id number] DOB: 1940/03/13  DOA: 02/01/2024 Date of Service: 02/04/2024 which is hospital day 2  PCP: [name], MD    Hospital course / significant events:   HPI: Jacob Montes is a pleasant 83 y.o. 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  Patient was in the hospital between 01/19/2024 and 02/08/2024 for decompensated heart failure.  Patient was treated with IV antibiotic with some improvement.  Patient was followed up in cardiology clinic on 01/27/2024, he was treated with IV diuretics then, today patient went to cardiology office where he reported not feeling well rather his swelling did not get better and got worse.  He complained of significant shortness of breath, orthopnea, hypoxia and lower extremity edema.  Oxygen saturation at the clinic today was 84% on room air.  Dr. Argentina from cardiology transferred the patient for further diuresis and treatment of congestive heart failure.  Patient denies any fever, chills, nausea, vomiting.  He complains of progressive worsening shortness of breath with leg swelling.  He also complains of some orthopnea denies any PND.  Denies any palpitations.   12/23: Upon arrival to the ED, patient is found to be tachypneic at 22, hypoxic requiring oxygen, room air oxygen saturation was 84%, 92% on 2 L.  Patient was given 80 mg of IV Lasix  with some improvement.  Hospitalist service was consulted for evaluation for admission for acute congestive heart failure exacerbation with hypoxia requiring IV diuresis. 12/24: Per cardiology - initial plan this morning was transfer to [facility] under Dr Delford for structural heart eval/imaging, through the morning this  plan evolved to now getting cardiac cath this Friday 12/26 and likely to [facility] from there  12/25: pt reports worsening productive cough, CXR does look like some pleural effusion probably mild/moderate, repeated Flu/COVID as we have seen multiple flu cases, this was negative. Per cardiology - increased lasix  12/26: cardiac cath today - progressive aortic stenosis question leaflet thrombosis, started heparin  gtt. Plan for to [facility] on Sunday 12/28     Consultants:  Cardiology - [facility]   Procedures/Surgeries: none      ASSESSMENT & PLAN:    Acute exacerbation of congestive heart failure with hypoxia Last echo EF from 50 to 55%. IV and p.o. Lasix  outpatient failed. Diuresis  Strict I&O, daily weight, salt/fluid restriction Cardiology following - plan transfer to [facility] 12/28 for structural heart eval/imaging   Acute hypoxic respiratory failure d/t CHF exacerbation and  pleural effusion  He was saturating 84% on room air. Worsening cough today  Supplemental O2 as needed  Lasix  per cardiology  Check COVID/Flu --> negative   HTN Blood pressure has been stable Continue amlodipine  10 mg daily, metoprolol  25 mg,  Diuresis as above Cardiology following   HLD Continue on rosuvastatin  20 mg   History of asthma Continue Singulair  Will place him on nebulizer as needed   History of CAD s/p CABG, AS s/p TAVR Continue aspirin , metoprolol , rosuvastatin      N/a based on BMI: There is no height or weight on file to calculate BMI.. Significantly low or high BMI is associated with higher medical risk.  Underweight - under 18  overweight - 25 to 29 obese - 30  or more Class 1 obesity: BMI of 30.0 to 34 Class 2 obesity: BMI of 35.0 to 39 Class 3 obesity: BMI of 40.0 to 49 Super Morbid Obesity: BMI 50-59 Super-super Morbid Obesity: BMI 60+ Healthy nutrition and physical activity advised as adjunct to other disease management and risk reduction treatments    DVT prophylaxis:  lovenox  IV fluids: no continuous IV fluids  Nutrition: cardiac diet Central lines / other devices: none  Code Status: FULL CODE ACP documentation reviewed:  none on file in [facility]  TOC needs: TBD Medical barriers to dispo: diuresin thru weekend. Expected medical readiness for transfer to [facility] likely 02/06/24.              Subjective / Brief ROS:  Patient reports SOB is a bit better today Denies CP/SOB.  Pain controlled.  Denies new weakness.  Tolerating diet.  Reports no concerns w/ urination/defecation.   Family Communication: wife at bedside on rouds    Objective Findings:  Vitals:   02/04/24 1115 02/04/24 1145 02/04/24 1200 02/04/24 1600  BP: (!) 120/45 (!) 111/41 (!) 116/42 (!) 122/52  Pulse: 80 80 78 84  Resp: 19 20 17    Temp:    98 F (36.7 C)  TempSrc:    Oral  SpO2: 94% 91% 95% 94%  Weight:      Height:        Intake/Output Summary (Last 24 hours) at 02/04/2024 1659 Last data filed at 02/04/2024 1348 Gross per 24 hour  Intake 620 ml  Output 1400 ml  Net -780 ml   Filed Weights   02/03/24 0424 02/04/24 0214  Weight: 95.6 kg (P) 95 kg    Examination:  Physical Exam Constitutional:      General: He is not in acute distress.    Appearance: He is not ill-appearing.  Cardiovascular:     Rate and Rhythm: Normal rate and regular rhythm.  Pulmonary:     Effort: Pulmonary effort is normal.     Breath sounds: Examination of the right-lower field reveals decreased breath sounds and rales. Examination of the left-lower field reveals decreased breath sounds and rales. Decreased breath sounds and rales present.  Neurological:     Mental Status: He is alert and oriented to person, place, and time.  Psychiatric:        Mood and Affect: Mood normal.        Behavior: Behavior normal.          Scheduled Medications:   aspirin   81 mg Oral Daily   furosemide   80 mg Intravenous BID   metoprolol  succinate  25 mg Oral Daily   multivitamin  with minerals  1 tablet Oral Daily   pantoprazole   40 mg Oral BID AC   potassium chloride  SA  20 mEq Oral Daily   rosuvastatin   40 mg Oral Daily   sodium chloride  flush  3 mL Intravenous Q12H    Continuous Infusions:  sodium chloride      heparin  1,100 Units/hr (02/04/24 1318)    PRN Medications:  sodium chloride , acetaminophen  **OR** acetaminophen , montelukast , morphine  injection, ondansetron  (ZOFRAN ) IV, ondansetron  **OR** [DISCONTINUED] ondansetron  (ZOFRAN ) IV, oxyCODONE , polyethylene glycol, sodium chloride  flush  Antimicrobials from admission:  Anti-infectives (From admission, onward)    None           Data Reviewed:  I have personally reviewed the following...  CBC: Recent Labs  Lab 02/01/24 1042 02/02/24 0819 02/04/24 0927 02/04/24 0931  WBC 5.4 4.9  --   --  HGB 12.9* 13.0 11.6* 11.9*  HCT 38.6* 39.7 34.0* 35.0*  MCV 97.5 98.5  --   --   PLT 133* 142*  --   --    Basic Metabolic Panel: Recent Labs  Lab 02/01/24 1042 02/02/24 0819 02/03/24 0400 02/04/24 0439 02/04/24 0927 02/04/24 0931  NA 139 140 140 140 138 139  K 4.6 4.1 4.4 3.7 3.7 3.6  CL 101 100 99 99  --   --   CO2 31 33* 31 33*  --   --   GLUCOSE 93 87 94 78  --   --   BUN 25* 27* 31* 31*  --   --   CREATININE 0.87 0.91 0.99 1.05  --   --   CALCIUM  9.3 9.4 9.2 9.0  --   --    GFR: Estimated Creatinine Clearance: 57.7 mL/min (by C-G formula based on SCr of 1.05 mg/dL). Liver Function Tests: Recent Labs  Lab 02/02/24 0819  AST 31  ALT 17  ALKPHOS 78  BILITOT 1.4*  PROT 6.2*  ALBUMIN 3.7   No results for input(s): LIPASE, AMYLASE in the last 168 hours. No results for input(s): AMMONIA in the last 168 hours. Coagulation Profile: Recent Labs  Lab 02/02/24 0819  INR 1.1   Cardiac Enzymes: No results for input(s): CKTOTAL, CKMB, CKMBINDEX, TROPONINI in the last 168 hours. BNP (last 3 results) Recent Labs    01/22/24 0347 01/28/24 0930 02/01/24 1042  PROBNP  1,643.0* 1,299.0* 2,213.0*   HbA1C: No results for input(s): HGBA1C in the last 72 hours. CBG: No results for input(s): GLUCAP in the last 168 hours. Lipid Profile: No results for input(s): CHOL, HDL, LDLCALC, TRIG, CHOLHDL, LDLDIRECT in the last 72 hours. Thyroid  Function Tests: No results for input(s): TSH, T4TOTAL, FREET4, T3FREE, THYROIDAB in the last 72 hours. Anemia Panel: No results for input(s): VITAMINB12, FOLATE, FERRITIN, TIBC, IRON, RETICCTPCT in the last 72 hours. Most Recent Urinalysis On File:     Component Value Date/Time   COLORURINE YELLOW (A) 03/04/2023 1311   APPEARANCEUR CLEAR (A) 03/04/2023 1311   LABSPEC 1.012 03/04/2023 1311   PHURINE 5.0 03/04/2023 1311   GLUCOSEU NEGATIVE 03/04/2023 1311   HGBUR NEGATIVE 03/04/2023 1311   BILIRUBINUR NEGATIVE 03/04/2023 1311   BILIRUBINUR Negative 10/14/2021 1059   KETONESUR NEGATIVE 03/04/2023 1311   PROTEINUR NEGATIVE 03/04/2023 1311   UROBILINOGEN 0.2 10/14/2021 1059   NITRITE NEGATIVE 03/04/2023 1311   LEUKOCYTESUR NEGATIVE 03/04/2023 1311   Sepsis Labs: @LABRCNTIP (procalcitonin:4,lacticidven:4) Microbiology: Recent Results (from the past 240 hours)  Resp panel by RT-PCR (RSV, Flu A&B, Covid) Anterior Nasal Swab     Status: None   Collection Time: 02/01/24 11:56 AM   Specimen: Anterior Nasal Swab  Result Value Ref Range Status   SARS Coronavirus 2 by RT PCR NEGATIVE NEGATIVE Final    Comment: (NOTE) SARS-CoV-2 target nucleic acids are NOT DETECTED.  The SARS-CoV-2 RNA is generally detectable in upper respiratory specimens during the acute phase of infection. The lowest concentration of SARS-CoV-2 viral copies this assay can detect is 138 copies/mL. A negative result does not preclude SARS-Cov-2 infection and should not be used as the sole basis for treatment or other patient management decisions. A negative result may occur with  improper specimen collection/handling,  submission of specimen other than nasopharyngeal swab, presence of viral mutation(s) within the areas targeted by this assay, and inadequate number of viral copies(<138 copies/mL). A negative result must be combined with clinical observations, patient history, and epidemiological  information. The expected result is Negative.  Fact Sheet for Patients:  bloggercourse.com  Fact Sheet for Healthcare Providers:  seriousbroker.it  This test is no t yet approved or cleared by the [place]  FDA and  has been authorized for detection and/or diagnosis of SARS-CoV-2 by FDA under an Emergency Use Authorization (EUA). This EUA will remain  in effect (meaning this test can be used) for the duration of the COVID-19 declaration under Section 564(b)(1) of the Act, 21 U.S.C.section 360bbb-3(b)(1), unless the authorization is terminated  or revoked sooner.       Influenza A by PCR NEGATIVE NEGATIVE Final   Influenza B by PCR NEGATIVE NEGATIVE Final    Comment: (NOTE) The Xpert Xpress SARS-CoV-2/FLU/RSV plus assay is intended as an aid in the diagnosis of influenza from Nasopharyngeal swab specimens and should not be used as a sole basis for treatment. Nasal washings and aspirates are unacceptable for Xpert Xpress SARS-CoV-2/FLU/RSV testing.  Fact Sheet for Patients: bloggercourse.com  Fact Sheet for Healthcare Providers: seriousbroker.it  This test is not yet approved or cleared by the [place]  FDA and has been authorized for detection and/or diagnosis of SARS-CoV-2 by FDA under an Emergency Use Authorization (EUA). This EUA will remain in effect (meaning this test can be used) for the duration of the COVID-19 declaration under Section 564(b)(1) of the Act, 21 U.S.C. section 360bbb-3(b)(1), unless the authorization is terminated or revoked.     Resp Syncytial Virus by PCR NEGATIVE  NEGATIVE Final    Comment: (NOTE) Fact Sheet for Patients: bloggercourse.com  Fact Sheet for Healthcare Providers: seriousbroker.it  This test is not yet approved or cleared by the [place]  FDA and has been authorized for detection and/or diagnosis of SARS-CoV-2 by FDA under an Emergency Use Authorization (EUA). This EUA will remain in effect (meaning this test can be used) for the duration of the COVID-19 declaration under Section 564(b)(1) of the Act, 21 U.S.C. section 360bbb-3(b)(1), unless the authorization is terminated or revoked.  Performed at [facility], [address] [place], [place] [address]   Resp panel by RT-PCR (RSV, Flu A&B, Covid) Anterior Nasal Swab     Status: None   Collection Time: 02/03/24 12:25 PM   Specimen: Anterior Nasal Swab  Result Value Ref Range Status   SARS Coronavirus 2 by RT PCR NEGATIVE NEGATIVE Final    Comment: (NOTE) SARS-CoV-2 target nucleic acids are NOT DETECTED.  The SARS-CoV-2 RNA is generally detectable in upper respiratory specimens during the acute phase of infection. The lowest concentration of SARS-CoV-2 viral copies this assay can detect is 138 copies/mL. A negative result does not preclude SARS-Cov-2 infection and should not be used as the sole basis for treatment or other patient management decisions. A negative result may occur with  improper specimen collection/handling, submission of specimen other than nasopharyngeal swab, presence of viral mutation(s) within the areas targeted by this assay, and inadequate number of viral copies(<138 copies/mL). A negative result must be combined with clinical observations, patient history, and epidemiological information. The expected result is Negative.  Fact Sheet for Patients:  bloggercourse.com  Fact Sheet for Healthcare Providers:  seriousbroker.it  This test is  no t yet approved or cleared by the [place]  FDA and  has been authorized for detection and/or diagnosis of SARS-CoV-2 by FDA under an Emergency Use Authorization (EUA). This EUA will remain  in effect (meaning this test can be used) for the duration of the COVID-19 declaration under Section 564(b)(1) of the Act,  21 U.S.C.section 360bbb-3(b)(1), unless the authorization is terminated  or revoked sooner.       Influenza A by PCR NEGATIVE NEGATIVE Final   Influenza B by PCR NEGATIVE NEGATIVE Final    Comment: (NOTE) The Xpert Xpress SARS-CoV-2/FLU/RSV plus assay is intended as an aid in the diagnosis of influenza from Nasopharyngeal swab specimens and should not be used as a sole basis for treatment. Nasal washings and aspirates are unacceptable for Xpert Xpress SARS-CoV-2/FLU/RSV testing.  Fact Sheet for Patients: bloggercourse.com  Fact Sheet for Healthcare Providers: seriousbroker.it  This test is not yet approved or cleared by the [place]  FDA and has been authorized for detection and/or diagnosis of SARS-CoV-2 by FDA under an Emergency Use Authorization (EUA). This EUA will remain in effect (meaning this test can be used) for the duration of the COVID-19 declaration under Section 564(b)(1) of the Act, 21 U.S.C. section 360bbb-3(b)(1), unless the authorization is terminated or revoked.     Resp Syncytial Virus by PCR NEGATIVE NEGATIVE Final    Comment: (NOTE) Fact Sheet for Patients: bloggercourse.com  Fact Sheet for Healthcare Providers: seriousbroker.it  This test is not yet approved or cleared by the [place]  FDA and has been authorized for detection and/or diagnosis of SARS-CoV-2 by FDA under an Emergency Use Authorization (EUA). This EUA will remain in effect (meaning this test can be used) for the duration of the COVID-19 declaration under Section  564(b)(1) of the Act, 21 U.S.C. section 360bbb-3(b)(1), unless the authorization is terminated or revoked.  Performed at [facility] [address] [place], [place] [address]       Radiology Studies last 3 days: CARDIAC CATHETERIZATION Result Date: 02/04/2024   Ost LM to Mid LM lesion is 95% stenosed.   Ost Cx to Prox Cx lesion is 100% stenosed.   Mid LAD lesion is 100% stenosed.   Mid Cx lesion is 100% stenosed.   3rd Mrg lesion is 40% stenosed.   Prox RCA lesion is 95% stenosed.   Mid RCA lesion is 80% stenosed.   SVG graft was visualized by angiography and is normal in caliber.   SVG graft was visualized by angiography.   LIMA graft was visualized by angiography and is normal in caliber.   The graft exhibits no disease.   The graft exhibits mild focal disease.   The graft exhibits no disease. 1.  Significant underlying three-vessel coronary artery disease with patent grafts including LIMA to LAD, SVG to OM1 and SVG to OM 3.  There is significant disease in the right coronary artery but the vessel is small.  Significant ostial left main stenosis supplying only a first diagonal branch. 2.  Left ventricular angiography was not performed.  EF was normal by echo.  I did not attempt to cross the TAVR prosthesis given concerns about leaflet thrombosis. 3.  Right heart catheterization showed normal right atrial pressure, moderate pulmonary hypertension, moderately elevated wedge pressure and normal cardiac output. RA: 3 mmHg, RV is 66/2 mmHg, PW is 22 mmHg with prominent V wave, PA: 55/17 with a mean of 30 mmHg.  Cardiac output/cardiac index: 6.31/3.1. Recommendations:  start heparin  drip 2 hours after TR band removal due to concerns about TAVR leaflet thrombosis. Continue IV diuresis. Recommend medical therapy for coronary artery disease.  No revascularization is needed. Continue evaluation for aortic valve valve in valve procedure.   DG Chest Bilateral Decubitus Result Date:  02/04/2024 CLINICAL DATA:  Pleural effusion. EXAM: CHEST - BILATERAL DECUBITUS VIEW COMPARISON:  02/01/2024 FINDINGS: Right-side-down decubitus film shows small layering right-sided pleural effusion. Left-side-down decubitus film shows moderate layering left pleural effusion. Basilar atelectasis or infiltrate again noted. The cardio pericardial silhouette is enlarged. IMPRESSION: Small right and moderate left layering pleural effusions. Electronically Signed   By: Camellia Candle M.D.   On: 02/04/2024 06:15   ECHOCARDIOGRAM COMPLETE Result Date: 02/02/2024    ECHOCARDIOGRAM REPORT   Patient Name:   Jacob Montes Date of Exam: 02/01/2024 Medical Rec #:  [id number]     Height:       66.0 in Accession #:    [id number]    Weight:       209.0 lb Date of Birth:  [birth year]     BSA:          2.038 m Patient Age:    83 years      BP:           112/42 mmHg Patient Gender: M             HR:           77 bpm. Exam Location:  [facility] Procedure: 2D Echo, Cardiac Doppler, Color Doppler and Intracardiac            Opacification Agent (Both Spectral and Color Flow Doppler were            utilized during procedure). Indications:     I50.31 Acute Diastolic Heart Failure  History:         Patient has prior history of Echocardiogram examinations, most                  recent 11/11/2023. CAD, TAVR. Left Carotid Endarterectomy. and                  Prior CABG, Stroke; Risk Factors:Dyslipidemia, Hypertension,                  Sleep Apnea and Former Smoker. Pulmonary embolism.                  Aortic Valve: 23 mm Sapien prosthetic, stented (TAVR) valve is                  present in the aortic position. Procedure Date: 01/19/2017.  Sonographer:     Carl Coma RDCS Referring Phys:  [id number] [facility] Diagnosing Phys: Caron Poser IMPRESSIONS  1. Left ventricular ejection fraction, by estimation, is 50 to 55%. The left ventricle has low normal function. The left ventricle has no regional wall motion abnormalities. The left  ventricular internal cavity size was mildly dilated. Left ventricular diastolic parameters are consistent with Grade II diastolic dysfunction (pseudonormalization).  2. Right ventricular systolic function was not well visualized. The right ventricular size is not well visualized.  3. Left atrial size was moderately dilated.  4. Right atrial size was mildly dilated.  5. The mitral valve is degenerative. Mild mitral valve regurgitation. Mild calcific mitral stenosis. Severe mitral annular calcification.  6. There is a 23 mm Sapien prosthetic (TAVR) valve present in the aortic position. Procedure Date: 01/19/2017. Echo findings are consistent with severe stenosis and moderate perivalvular leak of the aortic prosthesis. Aortic valve mean gradient measures  54.0 mmHg. Aortic valve Vmax measures 4.52 m/s. DVI 0.13.  7. The inferior vena cava is dilated in size with >50% respiratory variability, suggesting right atrial pressure of 8 mmHg. Comparison(s): A prior study was performed on 11/11/2023. TAVR indices now meet threshold for severe stenosis. Vmax  previously 3.64, MG 32 mmHg, DVI 0.26. Recommend valve team consult. FINDINGS  Left Ventricle: Left ventricular ejection fraction, by estimation, is 50 to 55%. The left ventricle has low normal function. The left ventricle has no regional wall motion abnormalities. Definity  contrast agent was given IV to delineate the left ventricular endocardial borders. The left ventricular internal cavity size was mildly dilated. There is no left ventricular hypertrophy. Left ventricular diastolic parameters are consistent with Grade II diastolic dysfunction (pseudonormalization). Right Ventricle: The right ventricular size is not well visualized. Right vetricular wall thickness was not well visualized. Right ventricular systolic function was not well visualized. Left Atrium: Left atrial size was moderately dilated. Right Atrium: Right atrial size was mildly dilated. Pericardium: There is  no evidence of pericardial effusion. Mitral Valve: The mitral valve is degenerative in appearance. There is severe calcification of the mitral valve leaflet(s). Moderate mitral annular calcification. Mild mitral valve regurgitation. Mild mitral valve stenosis. Tricuspid Valve: The tricuspid valve is not well visualized. Tricuspid valve regurgitation is not demonstrated. No evidence of tricuspid stenosis. There is a 23 mm Sapien prosthetic, stented (TAVR) valve present in the aortic position. Procedure Date: 01/19/2017. Pulmonic Valve: The pulmonic valve was not well visualized. Pulmonic valve regurgitation is not visualized. No evidence of pulmonic stenosis. Aorta: The aortic root is normal in size and structure. Venous: The inferior vena cava is dilated in size with greater than 50% respiratory variability, suggesting right atrial pressure of 8 mmHg. IAS/Shunts: The interatrial septum was not well visualized.  LEFT VENTRICLE PLAX 2D LVIDd:         6.30 cm Diastology LVIDs:         5.10 cm LV e' medial:    4.57 cm/s LV PW:         1.20 cm LV E/e' medial:  30.4 LV IVS:        0.70 cm LV e' lateral:   4.86 cm/s                        LV E/e' lateral: 28.6  RIGHT VENTRICLE             IVC RV Basal diam:  5.20 cm     IVC diam: 2.10 cm RV S prime:     10.09 cm/s TAPSE (M-mode): 2.2 cm LEFT ATRIUM             Index        RIGHT ATRIUM           Index LA diam:        5.80 cm 2.85 cm/m   RA Area:     18.30 cm LA Vol (A2C):   99.2 ml 48.69 ml/m  RA Volume:   53.50 ml  26.26 ml/m LA Vol (A4C):   79.4 ml 38.97 ml/m LA Biplane Vol: 90.8 ml 44.56 ml/m  AORTIC VALVE AV Vmax:           452.00 cm/s AV Vmean:          331.600 cm/s AV VTI:            0.942 m AV Peak Grad:      81.7 mmHg AV Mean Grad:      54.0 mmHg LVOT Vmax:         56.93 cm/s LVOT Vmean:        38.600 cm/s LVOT VTI:          0.108 m LVOT/AV VTI ratio: 0.12 AI  PHT:            199 msec  AORTA Ao Root diam: 2.90 cm MITRAL VALVE MV Area (PHT): 4.26 cm     SHUNTS  MV Decel Time: 178 msec     Systemic VTI: 0.11 m MR Peak grad: 145.7 mmHg MR Mean grad: 85.5 mmHg MR Vmax:      603.50 cm/s MR Vmean:     430.0 cm/s MV E velocity: 139.00 cm/s MV A velocity: 73.85 cm/s MV E/A ratio:  1.88 Caron Poser Electronically signed by Caron Poser Signature Date/Time: 02/02/2024/9:07:08 AM    Final    DG Chest 2 View Result Date: 02/01/2024 EXAM: 2 VIEW(S) XRAY OF THE CHEST 02/01/2024 09:25:00 AM COMPARISON: 01/19/2024 CLINICAL HISTORY: 83 year old male with persistent shortness of breath for 1 month. FINDINGS: LUNGS AND PLEURA: Vascular congestion/mild interstitial pulmonary edema. Small to moderate bilateral pleural effusions. Bibasilar airspace opacities. No pneumothorax. No significant change since 01/19/2024. HEART AND MEDIASTINUM: Cardiomegaly. Prosthetic aortic valve. BONES AND SOFT TISSUES: Prior median sternotomy. No acute osseous abnormality. IMPRESSION: 1. Pulmonary vascular congestion with bilateral pleural effusions not significantly changed since December/11/2023 2. No new cardiopulmonary abnormality. Electronically signed by: Helayne Hurst MD 02/01/2024 10:44 AM EST RP Workstation: [id number]        Laneta Blunt, DO [facility] 02/04/2024, 4:59 PM    Dictation software may have been used to generate the above note. Typos may occur and escape review in typed/dictated notes. Please contact Dr Blunt directly for clarity if needed.  Staff may message me via secure chat in Epic  but this may not receive an immediate response,  please page me for urgent matters!  If 7PM-7AM, please contact night coverage [email or web address]       "

## 2024-02-05 DIAGNOSIS — I5033 Acute on chronic diastolic (congestive) heart failure: Secondary | ICD-10-CM

## 2024-02-05 DIAGNOSIS — I2510 Atherosclerotic heart disease of native coronary artery without angina pectoris: Secondary | ICD-10-CM | POA: Diagnosis not present

## 2024-02-05 DIAGNOSIS — I350 Nonrheumatic aortic (valve) stenosis: Secondary | ICD-10-CM

## 2024-02-05 DIAGNOSIS — I10 Essential (primary) hypertension: Secondary | ICD-10-CM

## 2024-02-05 DIAGNOSIS — Z952 Presence of prosthetic heart valve: Secondary | ICD-10-CM

## 2024-02-05 LAB — CBC
HCT: 33.9 % — ABNORMAL LOW (ref 39.0–52.0)
Hemoglobin: 11.4 g/dL — ABNORMAL LOW (ref 13.0–17.0)
MCH: 32.2 pg (ref 26.0–34.0)
MCHC: 33.6 g/dL (ref 30.0–36.0)
MCV: 95.8 fL (ref 80.0–100.0)
Platelets: 116 K/uL — ABNORMAL LOW (ref 150–400)
RBC: 3.54 MIL/uL — ABNORMAL LOW (ref 4.22–5.81)
RDW: 14.6 % (ref 11.5–15.5)
WBC: 5.2 K/uL (ref 4.0–10.5)
nRBC: 0 % (ref 0.0–0.2)

## 2024-02-05 LAB — BASIC METABOLIC PANEL WITH GFR
Anion gap: 8 (ref 5–15)
BUN: 29 mg/dL — ABNORMAL HIGH (ref 8–23)
CO2: 33 mmol/L — ABNORMAL HIGH (ref 22–32)
Calcium: 8.9 mg/dL (ref 8.9–10.3)
Chloride: 97 mmol/L — ABNORMAL LOW (ref 98–111)
Creatinine, Ser: 1.06 mg/dL (ref 0.61–1.24)
GFR, Estimated: >60 mL/min
Glucose, Bld: 121 mg/dL — ABNORMAL HIGH (ref 70–99)
Potassium: 3.3 mmol/L — ABNORMAL LOW (ref 3.5–5.1)
Sodium: 138 mmol/L (ref 135–145)

## 2024-02-05 LAB — HEPARIN LEVEL (UNFRACTIONATED): Heparin Unfractionated: 0.48 [IU]/mL (ref 0.30–0.70)

## 2024-02-05 MED ORDER — BUDESONIDE 0.25 MG/2ML IN SUSP
0.2500 mg | Freq: Two times a day (BID) | RESPIRATORY_TRACT | Status: DC
  Administered 2024-02-05 – 2024-02-06 (×2): 0.25 mg via RESPIRATORY_TRACT
  Filled 2024-02-05 (×2): qty 2

## 2024-02-05 MED ORDER — IPRATROPIUM-ALBUTEROL 0.5-2.5 (3) MG/3ML IN SOLN
3.0000 mL | Freq: Two times a day (BID) | RESPIRATORY_TRACT | Status: DC
  Administered 2024-02-05 – 2024-02-06 (×2): 3 mL via RESPIRATORY_TRACT
  Filled 2024-02-05 (×2): qty 3

## 2024-02-05 MED ORDER — FUROSEMIDE 10 MG/ML IJ SOLN
60.0000 mg | Freq: Two times a day (BID) | INTRAMUSCULAR | Status: DC
  Administered 2024-02-05 – 2024-02-06 (×3): 60 mg via INTRAVENOUS
  Filled 2024-02-05 (×3): qty 6

## 2024-02-05 MED ORDER — METOLAZONE 5 MG PO TABS
5.0000 mg | ORAL_TABLET | Freq: Once | ORAL | Status: AC
  Administered 2024-02-05: 5 mg via ORAL
  Filled 2024-02-05: qty 1

## 2024-02-05 MED ORDER — POTASSIUM CHLORIDE CRYS ER 20 MEQ PO TBCR
40.0000 meq | EXTENDED_RELEASE_TABLET | Freq: Two times a day (BID) | ORAL | Status: DC
  Administered 2024-02-05 – 2024-02-06 (×3): 40 meq via ORAL
  Filled 2024-02-05 (×3): qty 2

## 2024-02-05 NOTE — Progress Notes (Signed)
 " PROGRESS NOTE    Jacob Montes   FMW:[id number] DOB: 1940-07-13  DOA: 02/01/2024 Date of Service: 02/05/2024 which is hospital day 3  PCP: [name], MD    Hospital course / significant events:   HPI: Jacob Montes is a pleasant 83 y.o. 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  Patient was in the hospital between 01/19/2024 and 02/08/2024 for decompensated heart failure.  Patient was treated with IV antibiotic with some improvement.  Patient was followed up in cardiology clinic on 01/27/2024, he was treated with IV diuretics then, today patient went to cardiology office where he reported not feeling well rather his swelling did not get better and got worse.  He complained of significant shortness of breath, orthopnea, hypoxia and lower extremity edema.  Oxygen saturation at the clinic today was 84% on room air.  Dr. Argentina from cardiology transferred the patient for further diuresis and treatment of congestive heart failure.  Patient denies any fever, chills, nausea, vomiting.  He complains of progressive worsening shortness of breath with leg swelling.  He also complains of some orthopnea denies any PND.  Denies any palpitations.   12/23: Upon arrival to the ED, patient is found to be tachypneic at 22, hypoxic requiring oxygen, room air oxygen saturation was 84%, 92% on 2 L.  Patient was given 80 mg of IV Lasix  with some improvement.  Hospitalist service was consulted for evaluation for admission for acute congestive heart failure exacerbation with hypoxia requiring IV diuresis. 12/24: Per cardiology - initial plan this morning was transfer to [facility] under Dr Delford for structural heart eval/imaging, through the morning this  plan evolved to now getting cardiac cath this Friday 12/26 and likely to [facility] from there  12/25: pt reports worsening productive cough, CXR does look like some pleural effusion probably mild/moderate, repeated Flu/COVID as we have seen multiple flu cases, this was negative. Per cardiology - increased lasix  12/26: cardiac cath today - progressive aortic stenosis question leaflet thrombosis, started heparin  gtt. Plan for to [facility] on Sunday 12/28 12/27: continues to diurese      Consultants:  Cardiology - [facility]   Procedures/Surgeries: none      ASSESSMENT & PLAN:    Acute exacerbation of congestive heart failure with hypoxia Last echo EF from 50 to 55%. IV and p.o. Lasix  outpatient failed. Diuresis  Strict I&O, daily weight, salt/fluid restriction Cardiology following - plan transfer to [facility] 12/28 for structural heart eval/imaging   History of CAD s/p CABG, AS s/p TAVR Continue aspirin , metoprolol , rosuvastatin   Acute hypoxic respiratory failure d/t CHF exacerbation and  pleural effusion  He was saturating 84% on room air. Worsening cough today  Supplemental O2 as needed  Lasix  per cardiology  Check COVID/Flu --> negative   HTN Blood pressure has been stable Continue amlodipine  10 mg daily, metoprolol  25 mg,  Diuresis as above Cardiology following   HLD Continue on rosuvastatin  20 mg   History of asthma Wheezing on exam  Continue Singulair  Will place him on DuoNeb and Pulmicort  nebs scheduled        N/a based on BMI: There is no height or weight on file to calculate BMI.. Significantly low or high BMI is associated with higher medical risk.  Underweight - under 18  overweight - 25 to 29 obese - 30 or more Class 1 obesity: BMI of 30.0 to 34 Class 2 obesity: BMI of 35.0 to 39 Class 3 obesity: BMI of 40.0 to 49 Super Morbid Obesity: BMI 50-59 Super-super Morbid Obesity: BMI 60+ Healthy nutrition and physical activity advised as adjunct to other disease  management and risk reduction treatments    DVT prophylaxis: lovenox  IV fluids: no continuous IV fluids  Nutrition: cardiac diet Central lines / other devices: none  Code Status: FULL CODE ACP documentation reviewed:  none on file in [facility]  TOC needs: TBD Medical barriers to dispo: diuresin thru weekend. Expected medical readiness for transfer to [facility] likely 02/06/24.              Subjective / Brief ROS:  Patient reports SOB is about same Denies CP  Pain controlled.  Denies new weakness.  Tolerating diet.  Reports no concerns w/ urination/defecation.   Family Communication: wife at bedside on rouds    Objective Findings:  Vitals:   02/05/24 0500 02/05/24 0721 02/05/24 1113 02/05/24 1540  BP:  113/88 (!) 113/47 (!) 113/47  Pulse:  72 79 80  Resp:  20 20 20   Temp:  98.4 F (36.9 C) 98.4 F (36.9 C) 98.8 F (37.1 C)  TempSrc:      SpO2:  92% 97% 91%  Weight: 95.1 kg     Height:        Intake/Output Summary (Last 24 hours) at 02/05/2024 1542 Last data filed at 02/05/2024 1535 Gross per 24 hour  Intake 666.8 ml  Output 2100 ml  Net -1433.2 ml   Filed Weights   02/03/24 0424 02/04/24 0214 02/05/24 0500  Weight: 95.6 kg (P) 95 kg 95.1 kg    Examination:  Physical Exam Constitutional:      General: He is not in acute distress.    Appearance: He is not ill-appearing.  Cardiovascular:     Rate and Rhythm: Normal rate and regular rhythm.  Pulmonary:     Effort: Pulmonary effort is normal.     Breath sounds: Examination of the right-lower field reveals decreased breath sounds and rales. Examination of the left-lower field reveals decreased breath sounds and rales. Decreased breath sounds and rales present.  Neurological:     Mental Status: He is alert and oriented to person, place, and time.  Psychiatric:        Mood and Affect: Mood normal.        Behavior: Behavior normal.          Scheduled Medications:   aspirin   81 mg Oral  Daily   budesonide  (PULMICORT ) nebulizer solution  0.25 mg Nebulization BID   furosemide   60 mg Intravenous BID   ipratropium-albuterol   3 mL Nebulization BID   metolazone   5 mg Oral Once   metoprolol  succinate  25 mg Oral Daily   multivitamin with minerals  1 tablet Oral Daily   pantoprazole   40 mg Oral BID AC   potassium chloride  SA  40 mEq Oral BID   rosuvastatin   40 mg Oral Daily   sodium chloride  flush  3 mL Intravenous Q12H    Continuous Infusions:  heparin  1,100 Units/hr (02/05/24 1207)    PRN Medications:  acetaminophen  **OR** acetaminophen , montelukast , morphine  injection, ondansetron  (ZOFRAN ) IV, ondansetron  **OR** [DISCONTINUED] ondansetron  (ZOFRAN ) IV, oxyCODONE , polyethylene glycol, sodium chloride  flush  Antimicrobials from admission:  Anti-infectives (From admission, onward)    None  Data Reviewed:  I have personally reviewed the following...  CBC: Recent Labs  Lab 02/01/24 1042 02/02/24 0819 02/04/24 0927 02/04/24 0931 02/05/24 0401  WBC 5.4 4.9  --   --  5.2  HGB 12.9* 13.0 11.6* 11.9* 11.4*  HCT 38.6* 39.7 34.0* 35.0* 33.9*  MCV 97.5 98.5  --   --  95.8  PLT 133* 142*  --   --  116*   Basic Metabolic Panel: Recent Labs  Lab 02/01/24 1042 02/02/24 0819 02/03/24 0400 02/04/24 0439 02/04/24 0927 02/04/24 0931 02/05/24 0401  NA 139 140 140 140 138 139 138  K 4.6 4.1 4.4 3.7 3.7 3.6 3.3*  CL 101 100 99 99  --   --  97*  CO2 31 33* 31 33*  --   --  33*  GLUCOSE 93 87 94 78  --   --  121*  BUN 25* 27* 31* 31*  --   --  29*  CREATININE 0.87 0.91 0.99 1.05  --   --  1.06  CALCIUM  9.3 9.4 9.2 9.0  --   --  8.9   GFR: Estimated Creatinine Clearance: 57 mL/min (by C-G formula based on SCr of 1.06 mg/dL). Liver Function Tests: Recent Labs  Lab 02/02/24 0819  AST 31  ALT 17  ALKPHOS 78  BILITOT 1.4*  PROT 6.2*  ALBUMIN 3.7   No results for input(s): LIPASE, AMYLASE in the last 168 hours. No results for input(s):  AMMONIA in the last 168 hours. Coagulation Profile: Recent Labs  Lab 02/02/24 0819  INR 1.1   Cardiac Enzymes: No results for input(s): CKTOTAL, CKMB, CKMBINDEX, TROPONINI in the last 168 hours. BNP (last 3 results) Recent Labs    01/22/24 0347 01/28/24 0930 02/01/24 1042  PROBNP 1,643.0* 1,299.0* 2,213.0*   HbA1C: No results for input(s): HGBA1C in the last 72 hours. CBG: No results for input(s): GLUCAP in the last 168 hours. Lipid Profile: No results for input(s): CHOL, HDL, LDLCALC, TRIG, CHOLHDL, LDLDIRECT in the last 72 hours. Thyroid  Function Tests: No results for input(s): TSH, T4TOTAL, FREET4, T3FREE, THYROIDAB in the last 72 hours. Anemia Panel: No results for input(s): VITAMINB12, FOLATE, FERRITIN, TIBC, IRON, RETICCTPCT in the last 72 hours. Most Recent Urinalysis On File:     Component Value Date/Time   COLORURINE YELLOW (A) 03/04/2023 1311   APPEARANCEUR CLEAR (A) 03/04/2023 1311   LABSPEC 1.012 03/04/2023 1311   PHURINE 5.0 03/04/2023 1311   GLUCOSEU NEGATIVE 03/04/2023 1311   HGBUR NEGATIVE 03/04/2023 1311   BILIRUBINUR NEGATIVE 03/04/2023 1311   BILIRUBINUR Negative 10/14/2021 1059   KETONESUR NEGATIVE 03/04/2023 1311   PROTEINUR NEGATIVE 03/04/2023 1311   UROBILINOGEN 0.2 10/14/2021 1059   NITRITE NEGATIVE 03/04/2023 1311   LEUKOCYTESUR NEGATIVE 03/04/2023 1311   Sepsis Labs: @LABRCNTIP (procalcitonin:4,lacticidven:4) Microbiology: Recent Results (from the past 240 hours)  Resp panel by RT-PCR (RSV, Flu A&B, Covid) Anterior Nasal Swab     Status: None   Collection Time: 02/01/24 11:56 AM   Specimen: Anterior Nasal Swab  Result Value Ref Range Status   SARS Coronavirus 2 by RT PCR NEGATIVE NEGATIVE Final    Comment: (NOTE) SARS-CoV-2 target nucleic acids are NOT DETECTED.  The SARS-CoV-2 RNA is generally detectable in upper respiratory specimens during the acute phase of infection. The  lowest concentration of SARS-CoV-2 viral copies this assay can detect is 138 copies/mL. A negative result does not preclude SARS-Cov-2 infection and should not be used as the sole basis for treatment or other  patient management decisions. A negative result may occur with  improper specimen collection/handling, submission of specimen other than nasopharyngeal swab, presence of viral mutation(s) within the areas targeted by this assay, and inadequate number of viral copies(<138 copies/mL). A negative result must be combined with clinical observations, patient history, and epidemiological information. The expected result is Negative.  Fact Sheet for Patients:  bloggercourse.com  Fact Sheet for Healthcare Providers:  seriousbroker.it  This test is no t yet approved or cleared by the [place]  FDA and  has been authorized for detection and/or diagnosis of SARS-CoV-2 by FDA under an Emergency Use Authorization (EUA). This EUA will remain  in effect (meaning this test can be used) for the duration of the COVID-19 declaration under Section 564(b)(1) of the Act, 21 U.S.C.section 360bbb-3(b)(1), unless the authorization is terminated  or revoked sooner.       Influenza A by PCR NEGATIVE NEGATIVE Final   Influenza B by PCR NEGATIVE NEGATIVE Final    Comment: (NOTE) The Xpert Xpress SARS-CoV-2/FLU/RSV plus assay is intended as an aid in the diagnosis of influenza from Nasopharyngeal swab specimens and should not be used as a sole basis for treatment. Nasal washings and aspirates are unacceptable for Xpert Xpress SARS-CoV-2/FLU/RSV testing.  Fact Sheet for Patients: bloggercourse.com  Fact Sheet for Healthcare Providers: seriousbroker.it  This test is not yet approved or cleared by the [place]  FDA and has been authorized for detection and/or diagnosis of SARS-CoV-2 by FDA under  an Emergency Use Authorization (EUA). This EUA will remain in effect (meaning this test can be used) for the duration of the COVID-19 declaration under Section 564(b)(1) of the Act, 21 U.S.C. section 360bbb-3(b)(1), unless the authorization is terminated or revoked.     Resp Syncytial Virus by PCR NEGATIVE NEGATIVE Final    Comment: (NOTE) Fact Sheet for Patients: bloggercourse.com  Fact Sheet for Healthcare Providers: seriousbroker.it  This test is not yet approved or cleared by the [place]  FDA and has been authorized for detection and/or diagnosis of SARS-CoV-2 by FDA under an Emergency Use Authorization (EUA). This EUA will remain in effect (meaning this test can be used) for the duration of the COVID-19 declaration under Section 564(b)(1) of the Act, 21 U.S.C. section 360bbb-3(b)(1), unless the authorization is terminated or revoked.  Performed at [facility], [address] [place], [place] [address]   Resp panel by RT-PCR (RSV, Flu A&B, Covid) Anterior Nasal Swab     Status: None   Collection Time: 02/03/24 12:25 PM   Specimen: Anterior Nasal Swab  Result Value Ref Range Status   SARS Coronavirus 2 by RT PCR NEGATIVE NEGATIVE Final    Comment: (NOTE) SARS-CoV-2 target nucleic acids are NOT DETECTED.  The SARS-CoV-2 RNA is generally detectable in upper respiratory specimens during the acute phase of infection. The lowest concentration of SARS-CoV-2 viral copies this assay can detect is 138 copies/mL. A negative result does not preclude SARS-Cov-2 infection and should not be used as the sole basis for treatment or other patient management decisions. A negative result may occur with  improper specimen collection/handling, submission of specimen other than nasopharyngeal swab, presence of viral mutation(s) within the areas targeted by this assay, and inadequate number of viral copies(<138 copies/mL). A  negative result must be combined with clinical observations, patient history, and epidemiological information. The expected result is Negative.  Fact Sheet for Patients:  bloggercourse.com  Fact Sheet for Healthcare Providers:  seriousbroker.it  This test is no t yet approved or  cleared by the [place]  FDA and  has been authorized for detection and/or diagnosis of SARS-CoV-2 by FDA under an Emergency Use Authorization (EUA). This EUA will remain  in effect (meaning this test can be used) for the duration of the COVID-19 declaration under Section 564(b)(1) of the Act, 21 U.S.C.section 360bbb-3(b)(1), unless the authorization is terminated  or revoked sooner.       Influenza A by PCR NEGATIVE NEGATIVE Final   Influenza B by PCR NEGATIVE NEGATIVE Final    Comment: (NOTE) The Xpert Xpress SARS-CoV-2/FLU/RSV plus assay is intended as an aid in the diagnosis of influenza from Nasopharyngeal swab specimens and should not be used as a sole basis for treatment. Nasal washings and aspirates are unacceptable for Xpert Xpress SARS-CoV-2/FLU/RSV testing.  Fact Sheet for Patients: bloggercourse.com  Fact Sheet for Healthcare Providers: seriousbroker.it  This test is not yet approved or cleared by the [place]  FDA and has been authorized for detection and/or diagnosis of SARS-CoV-2 by FDA under an Emergency Use Authorization (EUA). This EUA will remain in effect (meaning this test can be used) for the duration of the COVID-19 declaration under Section 564(b)(1) of the Act, 21 U.S.C. section 360bbb-3(b)(1), unless the authorization is terminated or revoked.     Resp Syncytial Virus by PCR NEGATIVE NEGATIVE Final    Comment: (NOTE) Fact Sheet for Patients: bloggercourse.com  Fact Sheet for Healthcare  Providers: seriousbroker.it  This test is not yet approved or cleared by the [place]  FDA and has been authorized for detection and/or diagnosis of SARS-CoV-2 by FDA under an Emergency Use Authorization (EUA). This EUA will remain in effect (meaning this test can be used) for the duration of the COVID-19 declaration under Section 564(b)(1) of the Act, 21 U.S.C. section 360bbb-3(b)(1), unless the authorization is terminated or revoked.  Performed at [facility], [address], [place], [place] [address]       Radiology Studies last 3 days: CARDIAC CATHETERIZATION Result Date: 02/04/2024   Ost LM to Mid LM lesion is 95% stenosed.   Ost Cx to Prox Cx lesion is 100% stenosed.   Mid LAD lesion is 100% stenosed.   Mid Cx lesion is 100% stenosed.   3rd Mrg lesion is 40% stenosed.   Prox RCA lesion is 95% stenosed.   Mid RCA lesion is 80% stenosed.   SVG graft was visualized by angiography and is normal in caliber.   SVG graft was visualized by angiography.   LIMA graft was visualized by angiography and is normal in caliber.   The graft exhibits no disease.   The graft exhibits mild focal disease.   The graft exhibits no disease. 1.  Significant underlying three-vessel coronary artery disease with patent grafts including LIMA to LAD, SVG to OM1 and SVG to OM 3.  There is significant disease in the right coronary artery but the vessel is small.  Significant ostial left main stenosis supplying only a first diagonal branch. 2.  Left ventricular angiography was not performed.  EF was normal by echo.  I did not attempt to cross the TAVR prosthesis given concerns about leaflet thrombosis. 3.  Right heart catheterization showed normal right atrial pressure, moderate pulmonary hypertension, moderately elevated wedge pressure and normal cardiac output. RA: 3 mmHg, RV is 66/2 mmHg, PW is 22 mmHg with prominent V wave, PA: 55/17 with a mean of 30 mmHg.  Cardiac  output/cardiac index: 6.31/3.1. Recommendations:  start heparin  drip 2 hours after TR band removal due to  concerns about TAVR leaflet thrombosis. Continue IV diuresis. Recommend medical therapy for coronary artery disease.  No revascularization is needed. Continue evaluation for aortic valve valve in valve procedure.   DG Chest Bilateral Decubitus Result Date: 02/04/2024 CLINICAL DATA:  Pleural effusion. EXAM: CHEST - BILATERAL DECUBITUS VIEW COMPARISON:  02/01/2024 FINDINGS: Right-side-down decubitus film shows small layering right-sided pleural effusion. Left-side-down decubitus film shows moderate layering left pleural effusion. Basilar atelectasis or infiltrate again noted. The cardio pericardial silhouette is enlarged. IMPRESSION: Small right and moderate left layering pleural effusions. Electronically Signed   By: Camellia Candle M.D.   On: 02/04/2024 06:15   ECHOCARDIOGRAM COMPLETE Result Date: 02/02/2024    ECHOCARDIOGRAM REPORT   Patient Name:   Jacob Montes Date of Exam: 02/01/2024 Medical Rec #:  [id number]     Height:       66.0 in Accession #:    [id number]    Weight:       209.0 lb Date of Birth:  01-31-1941     BSA:          2.038 m Patient Age:    83 years      BP:           112/42 mmHg Patient Gender: M             HR:           77 bpm. Exam Location:  [facility] Procedure: 2D Echo, Cardiac Doppler, Color Doppler and Intracardiac            Opacification Agent (Both Spectral and Color Flow Doppler were            utilized during procedure). Indications:     I50.31 Acute Diastolic Heart Failure  History:         Patient has prior history of Echocardiogram examinations, most                  recent 11/11/2023. CAD, TAVR. Left Carotid Endarterectomy. and                  Prior CABG, Stroke; Risk Factors:Dyslipidemia, Hypertension,                  Sleep Apnea and Former Smoker. Pulmonary embolism.                  Aortic Valve: 23 mm Sapien prosthetic, stented (TAVR) valve is                  present in  the aortic position. Procedure Date: 01/19/2017.  Sonographer:     Carl Coma RDCS Referring Phys:  [id number] [facility] PAUDEL Diagnosing Phys: Caron Poser IMPRESSIONS  1. Left ventricular ejection fraction, by estimation, is 50 to 55%. The left ventricle has low normal function. The left ventricle has no regional wall motion abnormalities. The left ventricular internal cavity size was mildly dilated. Left ventricular diastolic parameters are consistent with Grade II diastolic dysfunction (pseudonormalization).  2. Right ventricular systolic function was not well visualized. The right ventricular size is not well visualized.  3. Left atrial size was moderately dilated.  4. Right atrial size was mildly dilated.  5. The mitral valve is degenerative. Mild mitral valve regurgitation. Mild calcific mitral stenosis. Severe mitral annular calcification.  6. There is a 23 mm Sapien prosthetic (TAVR) valve present in the aortic position. Procedure Date: 01/19/2017. Echo findings are consistent with severe stenosis and moderate perivalvular leak of the aortic prosthesis. Aortic valve mean  gradient measures  54.0 mmHg. Aortic valve Vmax measures 4.52 m/s. DVI 0.13.  7. The inferior vena cava is dilated in size with >50% respiratory variability, suggesting right atrial pressure of 8 mmHg. Comparison(s): A prior study was performed on 11/11/2023. TAVR indices now meet threshold for severe stenosis. Vmax previously 3.64, MG 32 mmHg, DVI 0.26. Recommend valve team consult. FINDINGS  Left Ventricle: Left ventricular ejection fraction, by estimation, is 50 to 55%. The left ventricle has low normal function. The left ventricle has no regional wall motion abnormalities. Definity  contrast agent was given IV to delineate the left ventricular endocardial borders. The left ventricular internal cavity size was mildly dilated. There is no left ventricular hypertrophy. Left ventricular diastolic parameters are consistent with Grade  II diastolic dysfunction (pseudonormalization). Right Ventricle: The right ventricular size is not well visualized. Right vetricular wall thickness was not well visualized. Right ventricular systolic function was not well visualized. Left Atrium: Left atrial size was moderately dilated. Right Atrium: Right atrial size was mildly dilated. Pericardium: There is no evidence of pericardial effusion. Mitral Valve: The mitral valve is degenerative in appearance. There is severe calcification of the mitral valve leaflet(s). Moderate mitral annular calcification. Mild mitral valve regurgitation. Mild mitral valve stenosis. Tricuspid Valve: The tricuspid valve is not well visualized. Tricuspid valve regurgitation is not demonstrated. No evidence of tricuspid stenosis. There is a 23 mm Sapien prosthetic, stented (TAVR) valve present in the aortic position. Procedure Date: 01/19/2017. Pulmonic Valve: The pulmonic valve was not well visualized. Pulmonic valve regurgitation is not visualized. No evidence of pulmonic stenosis. Aorta: The aortic root is normal in size and structure. Venous: The inferior vena cava is dilated in size with greater than 50% respiratory variability, suggesting right atrial pressure of 8 mmHg. IAS/Shunts: The interatrial septum was not well visualized.  LEFT VENTRICLE PLAX 2D LVIDd:         6.30 cm Diastology LVIDs:         5.10 cm LV e' medial:    4.57 cm/s LV PW:         1.20 cm LV E/e' medial:  30.4 LV IVS:        0.70 cm LV e' lateral:   4.86 cm/s                        LV E/e' lateral: 28.6  RIGHT VENTRICLE             IVC RV Basal diam:  5.20 cm     IVC diam: 2.10 cm RV S prime:     10.09 cm/s TAPSE (M-mode): 2.2 cm LEFT ATRIUM             Index        RIGHT ATRIUM           Index LA diam:        5.80 cm 2.85 cm/m   RA Area:     18.30 cm LA Vol (A2C):   99.2 ml 48.69 ml/m  RA Volume:   53.50 ml  26.26 ml/m LA Vol (A4C):   79.4 ml 38.97 ml/m LA Biplane Vol: 90.8 ml 44.56 ml/m  AORTIC VALVE AV  Vmax:           452.00 cm/s AV Vmean:          331.600 cm/s AV VTI:            0.942 m AV Peak Grad:      81.7  mmHg AV Mean Grad:      54.0 mmHg LVOT Vmax:         56.93 cm/s LVOT Vmean:        38.600 cm/s LVOT VTI:          0.108 m LVOT/AV VTI ratio: 0.12 AI PHT:            199 msec  AORTA Ao Root diam: 2.90 cm MITRAL VALVE MV Area (PHT): 4.26 cm     SHUNTS MV Decel Time: 178 msec     Systemic VTI: 0.11 m MR Peak grad: 145.7 mmHg MR Mean grad: 85.5 mmHg MR Vmax:      603.50 cm/s MR Vmean:     430.0 cm/s MV E velocity: 139.00 cm/s MV A velocity: 73.85 cm/s MV E/A ratio:  1.88 Caron Poser Electronically signed by Caron Poser Signature Date/Time: 02/02/2024/9:07:08 AM    Final         Laneta Blunt, DO [facility] 02/05/2024, 3:42 PM    Dictation software may have been used to generate the above note. Typos may occur and escape review in typed/dictated notes. Please contact Dr Blunt directly for clarity if needed.  Staff may message me via secure chat in Epic  but this may not receive an immediate response,  please page me for urgent matters!  If 7PM-7AM, please contact night coverage [email or web address]       "

## 2024-02-05 NOTE — Progress Notes (Signed)
 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   Allergies[1]  Patient Measurements: Height: 5' 6 (167.6 cm) Weight: 95.1 kg (209 lb 10.5 oz) IBW/kg (Calculated) : 63.8  Vital Signs: Temp: 99 F (37.2 C) (12/27 0420) Temp Source: Oral (12/27 0420) BP: 113/49 (12/27 0420) Pulse Rate: 80 (12/27 0420)  Labs: Recent Labs    02/02/24 0819 02/03/24 0400 02/04/24 0439 02/04/24 0927 02/04/24 0931 02/04/24 2203 02/05/24 0401  HGB 13.0  --   --  11.6* 11.9*  --  11.4*  HCT 39.7  --   --  34.0* 35.0*  --  33.9*  PLT 142*  --   --   --   --   --  116*  LABPROT 15.2  --   --   --   --   --   --   INR 1.1  --   --   --   --   --   --   HEPARINUNFRC  --   --   --   --   --  0.35 0.48  CREATININE 0.91 0.99 1.05  --   --   --  1.06    Estimated Creatinine Clearance: 57 mL/min (by C-G formula based on SCr of 1.06 mg/dL).   Medical History: Past Medical History:  Diagnosis Date   Actinic keratosis    Acute exacerbation of CHF (congestive heart failure) (HCC) 01/19/2024   Aortic stenosis s/p TAVR    a. 01/2017 s/p TAVR - 23mm Sapien; b. 11/2023 Echo: EF 50-55%, GrII DD, nl RV fxn, mild-mod MR, mild Ca2+ MS, Mod AS, AVA 0.84, mean gradient 32 mmHg, mild perivalvular leak; c. 01/2024 Echo: EF 50-55%, no rwma, GrII DD, nl RV fxn, mod dil LA, mildly dil RA, mild MR/MS, sev AS w/ mod perivalvular leak - mean grad , Vmax 4.52 m/s, DVI 0.13.   CAD (coronary artery disease)    a. 07/2007 s/p CABG x 3 [facility] [place]); b. 12/2023 MV: No isch/infarct; c. 01/2024 Cath: LM 95ost/m, LAD 151m, LCX 100ost/p/m, OM3 40, RCA small, 95p, 69m, RPDA fills via collats from LAD. VG->OM1 nl, VG->OM3 nl, LIMA->LAD nl.   Cancer (HCC)    Skin   Carotid artery disease    a. 2009 s/p left sided CEA complicated by RLN parlaysis; b. 02/2023 U/S: no significant dzs.   Cataract    Chronic heart failure with preserved ejection fraction (HFpEF)  (HCC)    a. 01/2024 Echo: EF 50-55%, GrII DD, nl RV fxn, mod dil LA, mild MR/MS, severe AS, mod perivalvular leak; b. 01/2024 RHC: RA 3, RV 66/2, PA 55/17 (30), PCWP 20.   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   GERD (gastroesophageal reflux disease)    HLD (hyperlipidemia)    Hyperlipidemia 10/15/2015   Hypertension    Nephrolithiasis 11/16/2018   Pulmonary embolism (HCC) 08/2010   Skin cancer    Sleep apnea    Stroke [facility])     Medications:  Scheduled:   aspirin   81 mg Oral Daily   metoprolol  succinate  25 mg Oral Daily   multivitamin with minerals  1 tablet Oral Daily   pantoprazole   40 mg Oral BID AC   potassium chloride  SA  20 mEq Oral Daily   rosuvastatin   40 mg Oral Daily   sodium chloride  flush  3 mL Intravenous Q12H   Infusions:   sodium chloride      heparin  1,100  Units/hr (02/04/24 2212)   PRN: sodium chloride , acetaminophen  **OR** acetaminophen , montelukast , morphine  injection, ondansetron  (ZOFRAN ) IV, ondansetron  **OR** [DISCONTINUED] ondansetron  (ZOFRAN ) IV, oxyCODONE , polyethylene glycol, sodium chloride  flush  Assessment: 83 y.o. 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 Cardiology orders are to begin IV heparin  2 hours post radial band removal  (removal at 1115)  Goal of Therapy:  anti-Xa level 0.3-0.7 units/ml Monitor platelets by anticoagulation protocol: Yes  12/26 2203 HL 0.35, therapeutic x 1 12/27 0401 HL 0.48, therapeutic x 2   Plan:   --Continue heparin  infusion at 1100 units/hr --Recheck HL daily w/ AM labs while therapeutic --Continue to monitor H&H and  platelets  Rankin CANDIE Dills, PharmD, MBA 02/05/2024 6:00 AM        [1]  Allergies Allergen Reactions   Atorvastatin Other (See Comments)    aches  myalgia  myalgia    aches  myalgia   Ramipril Other (See Comments) and Cough    cough

## 2024-02-05 NOTE — Progress Notes (Signed)
 "  Rounding Note   Patient Name: Jacob Montes Date of Encounter: 02/05/2024  [facility] Cardiologist: Evalene Lunger, MD   Subjective No acute events overnight, net -576 liters over the past 24 hours.  Scheduled Meds:  aspirin   81 mg Oral Daily   furosemide   60 mg Intravenous BID   metoprolol  succinate  25 mg Oral Daily   multivitamin with minerals  1 tablet Oral Daily   pantoprazole   40 mg Oral BID AC   potassium chloride  SA  40 mEq Oral BID   rosuvastatin   40 mg Oral Daily   sodium chloride  flush  3 mL Intravenous Q12H   Continuous Infusions:  heparin  1,100 Units/hr (02/05/24 1207)   PRN Meds: acetaminophen  **OR** acetaminophen , montelukast , morphine  injection, ondansetron  (ZOFRAN ) IV, ondansetron  **OR** [DISCONTINUED] ondansetron  (ZOFRAN ) IV, oxyCODONE , polyethylene glycol, sodium chloride  flush   Vital Signs  Vitals:   02/05/24 0420 02/05/24 0500 02/05/24 0721 02/05/24 1113  BP: (!) 113/49  113/88 (!) 113/47  Pulse: 80  72 79  Resp: 19  20 20   Temp: 99 F (37.2 C)  98.4 F (36.9 C) 98.4 F (36.9 C)  TempSrc: Oral     SpO2: 95%  92% 97%  Weight:  95.1 kg    Height:        Intake/Output Summary (Last 24 hours) at 02/05/2024 1335 Last data filed at 02/05/2024 0806 Gross per 24 hour  Intake 546.8 ml  Output 1350 ml  Net -803.2 ml      02/05/2024    5:00 AM 02/04/2024    2:14 AM 02/03/2024    4:24 AM  Last 3 Weights  Weight (lbs) 209 lb 10.5 oz 209 lb 7 oz 210 lb 12.2 oz  Weight (kg) 95.1 kg 95 kg 95.6 kg      Telemetry Sinus rhythm, PVCs- Personally Reviewed  ECG   - Personally Reviewed  Physical Exam  GEN: No acute distress.   Neck: No JVD Cardiac: RRR, 3/6 systolic murmur Respiratory: Diminished breath sounds bilaterally GI: Soft, nontender, non-distended  MS: 2+ edema; No deformity. Neuro:  Nonfocal  Psych: Normal affect   Labs High Sensitivity Troponin:  No results for input(s): TROPONINIHS in the last 720 hours.  Recent  Labs  Lab 01/19/24 1019 01/19/24 1234 01/19/24 1649 01/19/24 1842 02/01/24 1042  TRNPT 99* 117* 101* 108* 30*       Chemistry Recent Labs  Lab 02/02/24 0819 02/03/24 0400 02/04/24 0439 02/04/24 0927 02/04/24 0931 02/05/24 0401  NA 140 140 140 138 139 138  K 4.1 4.4 3.7 3.7 3.6 3.3*  CL 100 99 99  --   --  97*  CO2 33* 31 33*  --   --  33*  GLUCOSE 87 94 78  --   --  121*  BUN 27* 31* 31*  --   --  29*  CREATININE 0.91 0.99 1.05  --   --  1.06  CALCIUM  9.4 9.2 9.0  --   --  8.9  PROT 6.2*  --   --   --   --   --   ALBUMIN 3.7  --   --   --   --   --   AST 31  --   --   --   --   --   ALT 17  --   --   --   --   --   ALKPHOS 78  --   --   --   --   --  BILITOT 1.4*  --   --   --   --   --   GFRNONAA >60 >60 >60  --   --  >60  ANIONGAP 7 10 8   --   --  8    Lipids No results for input(s): CHOL, TRIG, HDL, LABVLDL, LDLCALC, CHOLHDL in the last 168 hours.  Hematology Recent Labs  Lab 02/01/24 1042 02/02/24 0819 02/04/24 0927 02/04/24 0931 02/05/24 0401  WBC 5.4 4.9  --   --  5.2  RBC 3.96* 4.03*  --   --  3.54*  HGB 12.9* 13.0 11.6* 11.9* 11.4*  HCT 38.6* 39.7 34.0* 35.0* 33.9*  MCV 97.5 98.5  --   --  95.8  MCH 32.6 32.3  --   --  32.2  MCHC 33.4 32.7  --   --  33.6  RDW 14.6 14.6  --   --  14.6  PLT 133* 142*  --   --  116*   Thyroid  No results for input(s): TSH, FREET4 in the last 168 hours.  BNP Recent Labs  Lab 02/01/24 1042  PROBNP 2,213.0*    DDimer No results for input(s): DDIMER in the last 168 hours.   Radiology  CARDIAC CATHETERIZATION Result Date: 02/04/2024   Ost LM to Mid LM lesion is 95% stenosed.   Ost Cx to Prox Cx lesion is 100% stenosed.   Mid LAD lesion is 100% stenosed.   Mid Cx lesion is 100% stenosed.   3rd Mrg lesion is 40% stenosed.   Prox RCA lesion is 95% stenosed.   Mid RCA lesion is 80% stenosed.   SVG graft was visualized by angiography and is normal in caliber.   SVG graft was visualized by angiography.    LIMA graft was visualized by angiography and is normal in caliber.   The graft exhibits no disease.   The graft exhibits mild focal disease.   The graft exhibits no disease. 1.  Significant underlying three-vessel coronary artery disease with patent grafts including LIMA to LAD, SVG to OM1 and SVG to OM 3.  There is significant disease in the right coronary artery but the vessel is small.  Significant ostial left main stenosis supplying only a first diagonal branch. 2.  Left ventricular angiography was not performed.  EF was normal by echo.  I did not attempt to cross the TAVR prosthesis given concerns about leaflet thrombosis. 3.  Right heart catheterization showed normal right atrial pressure, moderate pulmonary hypertension, moderately elevated wedge pressure and normal cardiac output. RA: 3 mmHg, RV is 66/2 mmHg, PW is 22 mmHg with prominent V wave, PA: 55/17 with a mean of 30 mmHg.  Cardiac output/cardiac index: 6.31/3.1. Recommendations:  start heparin  drip 2 hours after TR band removal due to concerns about TAVR leaflet thrombosis. Continue IV diuresis. Recommend medical therapy for coronary artery disease.  No revascularization is needed. Continue evaluation for aortic valve valve in valve procedure.    Cardiac Studies Left heart cath 12/26 with 2025 patent LIMA to LAD, patent SVG to OM1, patent SVG to OM 3 Echo 02/01/2024 EF 50 to 55%, bioprosthetic aortic valve mean gradient 54 mmHg  Patient Profile   83 y.o. male with history of CAD/CABG x 3 in 2005, TAVR 2018, hypertension, CVA, HFpEF presenting with shortness of breath and volume overload.  Assessment & Plan  Aortic stenosis history of TAVR in 2018 - Echo revealing severely elevated aortic valve gradient of 54 mmHg. -Continue diuresing IV Lasix  60 mg twice  daily, will give metolazone  5 mg x 1. -Patient on heparin  due to concern for leaflet thrombosis.  Continue heparin  drip. - Plan transfer to [facility] tomorrow on Monday for valve workup and  potential valve in valve procedure.  2.  CAD s/p CABG Left heart cath with patent LIMA graft, patent SVG to OM1 graft, patent SVG to OM 3 graft - Continue aspirin , Toprol -XL.  3.  HFpEF -2+ leg edema, diminished breath sounds. - Diuresing as in #1 above.  Signed, Redell Cave, MD  02/05/2024, 1:35 PM    "

## 2024-02-06 ENCOUNTER — Inpatient Hospital Stay (HOSPITAL_COMMUNITY)
Admission: EM | Admit: 2024-02-06 | Discharge: 2024-02-17 | DRG: 314 | Disposition: A | Discharge status: Skilled Nursing Facility | Source: Other Acute Inpatient Hospital | Attending: Internal Medicine | Admitting: Internal Medicine

## 2024-02-06 ENCOUNTER — Other Ambulatory Visit: Payer: Self-pay

## 2024-02-06 DIAGNOSIS — Z781 Physical restraint status: Secondary | ICD-10-CM

## 2024-02-06 DIAGNOSIS — Z6832 Body mass index (BMI) 32.0-32.9, adult: Secondary | ICD-10-CM | POA: Diagnosis not present

## 2024-02-06 DIAGNOSIS — I5033 Acute on chronic diastolic (congestive) heart failure: Secondary | ICD-10-CM | POA: Diagnosis present

## 2024-02-06 DIAGNOSIS — N179 Acute kidney failure, unspecified: Secondary | ICD-10-CM | POA: Diagnosis present

## 2024-02-06 DIAGNOSIS — Z953 Presence of xenogenic heart valve: Secondary | ICD-10-CM | POA: Diagnosis not present

## 2024-02-06 DIAGNOSIS — F32A Depression, unspecified: Secondary | ICD-10-CM | POA: Diagnosis present

## 2024-02-06 DIAGNOSIS — J9621 Acute and chronic respiratory failure with hypoxia: Secondary | ICD-10-CM | POA: Diagnosis present

## 2024-02-06 DIAGNOSIS — Z888 Allergy status to other drugs, medicaments and biological substances status: Secondary | ICD-10-CM

## 2024-02-06 DIAGNOSIS — J810 Acute pulmonary edema: Secondary | ICD-10-CM | POA: Diagnosis not present

## 2024-02-06 DIAGNOSIS — F05 Delirium due to known physiological condition: Secondary | ICD-10-CM | POA: Diagnosis not present

## 2024-02-06 DIAGNOSIS — Z7901 Long term (current) use of anticoagulants: Secondary | ICD-10-CM | POA: Diagnosis not present

## 2024-02-06 DIAGNOSIS — Z951 Presence of aortocoronary bypass graft: Secondary | ICD-10-CM

## 2024-02-06 DIAGNOSIS — I110 Hypertensive heart disease with heart failure: Secondary | ICD-10-CM | POA: Diagnosis present

## 2024-02-06 DIAGNOSIS — E669 Obesity, unspecified: Secondary | ICD-10-CM | POA: Diagnosis present

## 2024-02-06 DIAGNOSIS — Z7982 Long term (current) use of aspirin: Secondary | ICD-10-CM | POA: Diagnosis not present

## 2024-02-06 DIAGNOSIS — I959 Hypotension, unspecified: Secondary | ICD-10-CM | POA: Diagnosis not present

## 2024-02-06 DIAGNOSIS — E876 Hypokalemia: Secondary | ICD-10-CM | POA: Diagnosis present

## 2024-02-06 DIAGNOSIS — Z66 Do not resuscitate: Secondary | ICD-10-CM | POA: Diagnosis present

## 2024-02-06 DIAGNOSIS — I2510 Atherosclerotic heart disease of native coronary artery without angina pectoris: Secondary | ICD-10-CM | POA: Diagnosis present

## 2024-02-06 DIAGNOSIS — Y831 Surgical operation with implant of artificial internal device as the cause of abnormal reaction of the patient, or of later complication, without mention of misadventure at the time of the procedure: Secondary | ICD-10-CM | POA: Diagnosis present

## 2024-02-06 DIAGNOSIS — I350 Nonrheumatic aortic (valve) stenosis: Secondary | ICD-10-CM | POA: Diagnosis present

## 2024-02-06 DIAGNOSIS — Z86711 Personal history of pulmonary embolism: Secondary | ICD-10-CM

## 2024-02-06 DIAGNOSIS — Z8249 Family history of ischemic heart disease and other diseases of the circulatory system: Secondary | ICD-10-CM

## 2024-02-06 DIAGNOSIS — Z8673 Personal history of transient ischemic attack (TIA), and cerebral infarction without residual deficits: Secondary | ICD-10-CM

## 2024-02-06 DIAGNOSIS — Z9049 Acquired absence of other specified parts of digestive tract: Secondary | ICD-10-CM

## 2024-02-06 DIAGNOSIS — Z86718 Personal history of other venous thrombosis and embolism: Secondary | ICD-10-CM

## 2024-02-06 DIAGNOSIS — Z79899 Other long term (current) drug therapy: Secondary | ICD-10-CM

## 2024-02-06 DIAGNOSIS — I5023 Acute on chronic systolic (congestive) heart failure: Secondary | ICD-10-CM | POA: Diagnosis not present

## 2024-02-06 DIAGNOSIS — E785 Hyperlipidemia, unspecified: Secondary | ICD-10-CM | POA: Diagnosis present

## 2024-02-06 DIAGNOSIS — Z7951 Long term (current) use of inhaled steroids: Secondary | ICD-10-CM | POA: Diagnosis not present

## 2024-02-06 DIAGNOSIS — Z87891 Personal history of nicotine dependence: Secondary | ICD-10-CM

## 2024-02-06 DIAGNOSIS — Z85828 Personal history of other malignant neoplasm of skin: Secondary | ICD-10-CM | POA: Diagnosis not present

## 2024-02-06 DIAGNOSIS — T82857A Stenosis of cardiac prosthetic devices, implants and grafts, initial encounter: Principal | ICD-10-CM | POA: Diagnosis present

## 2024-02-06 DIAGNOSIS — T8209XA Other mechanical complication of heart valve prosthesis, initial encounter: Secondary | ICD-10-CM

## 2024-02-06 DIAGNOSIS — Z7401 Bed confinement status: Secondary | ICD-10-CM | POA: Diagnosis not present

## 2024-02-06 DIAGNOSIS — Z881 Allergy status to other antibiotic agents status: Secondary | ICD-10-CM

## 2024-02-06 DIAGNOSIS — R410 Disorientation, unspecified: Secondary | ICD-10-CM | POA: Diagnosis not present

## 2024-02-06 DIAGNOSIS — J690 Pneumonitis due to inhalation of food and vomit: Secondary | ICD-10-CM | POA: Diagnosis present

## 2024-02-06 LAB — CBC
HCT: 38.9 % — ABNORMAL LOW (ref 39.0–52.0)
Hemoglobin: 13.1 g/dL (ref 13.0–17.0)
MCH: 31.8 pg (ref 26.0–34.0)
MCHC: 33.7 g/dL (ref 30.0–36.0)
MCV: 94.4 fL (ref 80.0–100.0)
Platelets: 129 K/uL — ABNORMAL LOW (ref 150–400)
RBC: 4.12 MIL/uL — ABNORMAL LOW (ref 4.22–5.81)
RDW: 14.4 % (ref 11.5–15.5)
WBC: 5.9 K/uL (ref 4.0–10.5)
nRBC: 0 % (ref 0.0–0.2)

## 2024-02-06 LAB — BASIC METABOLIC PANEL WITH GFR
Anion gap: 8 (ref 5–15)
BUN: 22 mg/dL (ref 8–23)
CO2: 36 mmol/L — ABNORMAL HIGH (ref 22–32)
Calcium: 9.4 mg/dL (ref 8.9–10.3)
Chloride: 98 mmol/L (ref 98–111)
Creatinine, Ser: 1.2 mg/dL (ref 0.61–1.24)
GFR, Estimated: >60 mL/min
Glucose, Bld: 147 mg/dL — ABNORMAL HIGH (ref 70–99)
Potassium: 3.8 mmol/L (ref 3.5–5.1)
Sodium: 141 mmol/L (ref 135–145)

## 2024-02-06 LAB — HEPARIN LEVEL (UNFRACTIONATED): Heparin Unfractionated: 0.55 [IU]/mL (ref 0.30–0.70)

## 2024-02-06 MED ORDER — ACETAMINOPHEN 325 MG PO TABS: 650.0000 mg | ORAL_TABLET | Freq: Four times a day (QID) | ORAL | Status: AC | PRN

## 2024-02-06 MED ORDER — ADULT MULTIVITAMIN W/MINERALS CH
1.0000 | ORAL_TABLET | Freq: Every day | ORAL | Status: DC
  Administered 2024-02-07 – 2024-02-17 (×10): 1 via ORAL
  Filled 2024-02-06 (×4): qty 1

## 2024-02-06 MED ORDER — BUDESONIDE 0.25 MG/2ML IN SUSP
0.2500 mg | Freq: Two times a day (BID) | RESPIRATORY_TRACT | Status: DC
  Administered 2024-02-06 – 2024-02-16 (×19): 0.25 mg via RESPIRATORY_TRACT
  Filled 2024-02-06 (×7): qty 2

## 2024-02-06 MED ORDER — METOPROLOL SUCCINATE ER 25 MG PO TB24: 25.0000 mg | ORAL_TABLET | Freq: Every day | ORAL | Status: AC

## 2024-02-06 MED ORDER — MONTELUKAST SODIUM 10 MG PO TABS: 10.0000 mg | ORAL_TABLET | Freq: Every evening | ORAL | Status: DC | PRN

## 2024-02-06 MED ORDER — ACETAMINOPHEN 325 MG PO TABS
650.0000 mg | ORAL_TABLET | ORAL | Status: DC | PRN
  Administered 2024-02-10 – 2024-02-13 (×2): 650 mg via ORAL

## 2024-02-06 MED ORDER — ASPIRIN 81 MG PO TBEC
81.0000 mg | DELAYED_RELEASE_TABLET | Freq: Every day | ORAL | Status: DC
  Administered 2024-02-06 – 2024-02-16 (×11): 81 mg via ORAL
  Filled 2024-02-06 (×4): qty 1

## 2024-02-06 MED ORDER — ADULT MULTIVITAMIN W/MINERALS CH: 1.0000 | ORAL_TABLET | Freq: Every day | ORAL | Status: AC

## 2024-02-06 MED ORDER — IPRATROPIUM-ALBUTEROL 0.5-2.5 (3) MG/3ML IN SOLN: 3.0000 mL | Freq: Two times a day (BID) | RESPIRATORY_TRACT | Status: DC

## 2024-02-06 MED ORDER — ROSUVASTATIN CALCIUM 20 MG PO TABS
40.0000 mg | ORAL_TABLET | Freq: Every day | ORAL | Status: DC
  Administered 2024-02-06 – 2024-02-16 (×10): 40 mg via ORAL
  Filled 2024-02-06 (×4): qty 2

## 2024-02-06 MED ORDER — PANTOPRAZOLE SODIUM 40 MG PO TBEC
40.0000 mg | DELAYED_RELEASE_TABLET | Freq: Every day | ORAL | Status: DC
  Administered 2024-02-07 – 2024-02-17 (×9): 40 mg via ORAL
  Filled 2024-02-06 (×4): qty 1

## 2024-02-06 MED ORDER — FUROSEMIDE 10 MG/ML IJ SOLN
40.0000 mg | Freq: Two times a day (BID) | INTRAMUSCULAR | Status: DC
  Administered 2024-02-06 – 2024-02-07 (×2): 40 mg via INTRAVENOUS
  Filled 2024-02-06: qty 4

## 2024-02-06 MED ORDER — HEPARIN (PORCINE) 25000 UT/250ML-% IV SOLN
1200.0000 [IU]/h | INTRAVENOUS | Status: DC
  Administered 2024-02-06 – 2024-02-07 (×2): 1100 [IU]/h via INTRAVENOUS

## 2024-02-06 MED ORDER — IPRATROPIUM-ALBUTEROL 0.5-2.5 (3) MG/3ML IN SOLN
3.0000 mL | Freq: Two times a day (BID) | RESPIRATORY_TRACT | Status: DC
  Administered 2024-02-06 – 2024-02-09 (×6): 3 mL via RESPIRATORY_TRACT
  Filled 2024-02-06: qty 3

## 2024-02-06 MED ORDER — POTASSIUM CHLORIDE CRYS ER 20 MEQ PO TBCR
40.0000 meq | EXTENDED_RELEASE_TABLET | Freq: Two times a day (BID) | ORAL | Status: DC
  Administered 2024-02-06: 40 meq via ORAL
  Filled 2024-02-06: qty 2

## 2024-02-06 MED ORDER — METOLAZONE 5 MG PO TABS: 2.5000 mg | ORAL_TABLET | Freq: Every day | ORAL | Status: DC

## 2024-02-06 MED ORDER — METOPROLOL SUCCINATE ER 25 MG PO TB24
25.0000 mg | ORAL_TABLET | Freq: Every day | ORAL | Status: DC
  Administered 2024-02-06 – 2024-02-17 (×11): 25 mg via ORAL
  Filled 2024-02-06 (×5): qty 1

## 2024-02-06 MED ORDER — PANTOPRAZOLE SODIUM 40 MG PO TBEC: 40.0000 mg | DELAYED_RELEASE_TABLET | Freq: Two times a day (BID) | ORAL | Status: DC

## 2024-02-06 MED ORDER — BUDESONIDE 0.25 MG/2ML IN SUSP: 0.2500 mg | Freq: Two times a day (BID) | RESPIRATORY_TRACT | Status: DC

## 2024-02-06 MED ORDER — POLYETHYLENE GLYCOL 3350 17 G PO PACK: 17.0000 g | PACK | Freq: Every day | ORAL | Status: AC | PRN

## 2024-02-06 MED ORDER — OXYCODONE HCL 5 MG PO TABS: 5.0000 mg | ORAL_TABLET | ORAL | Status: DC | PRN

## 2024-02-06 MED ORDER — POLYETHYLENE GLYCOL 3350 17 G PO PACK: 17.0000 g | PACK | Freq: Every day | ORAL | Status: DC | PRN

## 2024-02-06 MED ORDER — POTASSIUM CHLORIDE CRYS ER 20 MEQ PO TBCR: 40.0000 meq | EXTENDED_RELEASE_TABLET | Freq: Two times a day (BID) | ORAL | Status: DC

## 2024-02-06 MED ORDER — MORPHINE SULFATE (PF) 2 MG/ML IV SOLN: 2.0000 mg | INTRAVENOUS | Status: DC | PRN

## 2024-02-06 MED ORDER — NITROGLYCERIN 0.4 MG SL SUBL: 0.4000 mg | SUBLINGUAL_TABLET | SUBLINGUAL | Status: DC | PRN

## 2024-02-06 MED ORDER — ROSUVASTATIN CALCIUM 40 MG PO TABS: 40.0000 mg | ORAL_TABLET | Freq: Every day | ORAL | Status: DC

## 2024-02-06 MED ORDER — SODIUM CHLORIDE 0.9% FLUSH: 3.0000 mL | INTRAVENOUS | Status: DC | PRN

## 2024-02-06 MED ORDER — FUROSEMIDE 10 MG/ML IJ SOLN: 40.0000 mg | Freq: Two times a day (BID) | INTRAMUSCULAR | Status: DC

## 2024-02-06 MED ORDER — METOLAZONE 2.5 MG PO TABS
2.5000 mg | ORAL_TABLET | Freq: Once | ORAL | Status: AC
  Administered 2024-02-06: 2.5 mg via ORAL
  Filled 2024-02-06: qty 1

## 2024-02-06 MED ORDER — ONDANSETRON HCL 4 MG/2ML IJ SOLN
4.0000 mg | Freq: Four times a day (QID) | INTRAMUSCULAR | Status: DC | PRN
  Administered 2024-02-07 – 2024-02-09 (×2): 4 mg via INTRAVENOUS

## 2024-02-06 MED ORDER — HEPARIN (PORCINE) 25000 UT/250ML-% IV SOLN: 1100.0000 [IU]/h | INTRAVENOUS | Status: DC

## 2024-02-06 MED ORDER — ONDANSETRON HCL 4 MG PO TABS: 4.0000 mg | ORAL_TABLET | Freq: Four times a day (QID) | ORAL | Status: DC | PRN

## 2024-02-06 MED ORDER — SODIUM CHLORIDE 0.9% FLUSH: 3.0000 mL | Freq: Two times a day (BID) | INTRAVENOUS | Status: DC

## 2024-02-06 MED ORDER — ONDANSETRON HCL 4 MG/2ML IJ SOLN: 4.0000 mg | Freq: Four times a day (QID) | INTRAMUSCULAR | Status: DC | PRN

## 2024-02-06 NOTE — Progress Notes (Signed)
 PHARMACY - ANTICOAGULATION CONSULT NOTE  Pharmacy Consult for heparin  infusion Indication: VTE, concern for AoV thrombosis  Allergies[1]  Patient Measurements:    Vital Signs: Temp: 98.9 F (37.2 C) (12/28 1617) Temp Source: Oral (12/28 1617) BP: 113/39 (12/28 1617) Pulse Rate: 85 (12/28 1617)  Labs: Recent Labs    02/04/24 0439 02/04/24 0927 02/04/24 0931 02/04/24 2203 02/05/24 0401 02/06/24 0354  HGB  --    < > 11.9*  --  11.4* 13.1  HCT  --    < > 35.0*  --  33.9* 38.9*  PLT  --   --   --   --  116* 129*  HEPARINUNFRC  --   --   --  0.35 0.48 0.55  CREATININE 1.05  --   --   --  1.06 1.20   < > = values in this interval not displayed.    Estimated Creatinine Clearance: 50 mL/min (by C-G formula based on SCr of 1.2 mg/dL).   Medical History: Past Medical History:  Diagnosis Date   Actinic keratosis    Acute exacerbation of CHF (congestive heart failure) (HCC) 01/19/2024   Aortic stenosis s/p TAVR    a. 01/2017 s/p TAVR - 23mm Sapien; b. 11/2023 Echo: EF 50-55%, GrII DD, nl RV fxn, mild-mod MR, mild Ca2+ MS, Mod AS, AVA 0.84, mean gradient 32 mmHg, mild perivalvular leak; c. 01/2024 Echo: EF 50-55%, no rwma, GrII DD, nl RV fxn, mod dil LA, mildly dil RA, mild MR/MS, sev AS w/ mod perivalvular leak - mean grad , Vmax 4.52 m/s, DVI 0.13.   CAD (coronary artery disease)    a. 07/2007 s/p CABG x 3 [facility] [place]); b. 12/2023 MV: No isch/infarct; c. 01/2024 Cath: LM 95ost/m, LAD 129m, LCX 100ost/p/m, OM3 40, RCA small, 95p, 36m, RPDA fills via collats from LAD. VG->OM1 nl, VG->OM3 nl, LIMA->LAD nl.   Cancer (HCC)    Skin   Carotid artery disease    a. 2009 s/p left sided CEA complicated by RLN parlaysis; b. 02/2023 U/S: no significant dzs.   Cataract    Chronic heart failure with preserved ejection fraction (HFpEF) (HCC)    a. 01/2024 Echo: EF 50-55%, GrII DD, nl RV fxn, mod dil LA, mild MR/MS, severe AS, mod perivalvular leak; b. 01/2024 RHC: RA 3, RV 66/2, PA  55/17 (30), PCWP 20.   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   GERD (gastroesophageal reflux disease)    HLD (hyperlipidemia)    Hyperlipidemia 10/15/2015   Hypertension    Nephrolithiasis 11/16/2018   Pulmonary embolism (HCC) 08/2010   Skin cancer    Sleep apnea    Stroke (HCC)     Medications:  Scheduled:   aspirin  EC  81 mg Oral Daily   budesonide  (PULMICORT ) nebulizer solution  0.25 mg Nebulization BID   furosemide   40 mg Intravenous BID   ipratropium-albuterol   3 mL Nebulization BID   [START ON 02/07/2024] metolazone   2.5 mg Oral Daily   metoprolol  succinate  25 mg Oral Daily   [START ON 02/07/2024] multivitamin with minerals  1 tablet Oral Daily   [START ON 02/07/2024] pantoprazole   40 mg Oral Q0600   potassium chloride   40 mEq Oral BID   rosuvastatin   40 mg Oral Daily   Infusions:    PRN: acetaminophen , montelukast , nitroGLYCERIN , ondansetron  (ZOFRAN ) IV, polyethylene glycol  Assessment: 83 y.o. male with medical history significant for HTN, CAD s/p CABG 07/2007, severe AS s/p TAVR 01/2027,  carotid stenosis s/p left carotid endarterectomy in 2009, HLD, GERD who was brought in from cardiology office for evaluation of lower extremity edema not getting better with outpatient treatment with Lasix . Now s/p [facility] cardiology wishes to start IV heparin  ISO suspected TAVR thrombosis. Cardiology orders are to begin IV heparin  2 hours post radial band removal  (removal at 1115)  Heparin  continued on transfer to   Goal of Therapy:  anti-Xa level 0.3-0.7 units/ml Monitor platelets by anticoagulation protocol: Yes  12/26 2203 HL 0.35, therapeutic x 1 12/27 0401 HL 0.48, therapeutic x 2 12/28 0354 HL 0.55, therapeutic x 3   Plan:   Continue heparin  infusion at 1100 units/hr at [facility] Recheck HL daily w/ AM labs while therapeutic Continue to monitor H&H and platelets  Larraine Brazier, PharmD Clinical Pharmacist 02/06/2024  5:08  PM **Pharmacist phone directory can now be found on [email or web address] ([email or web address]).  Listed under [facility].            [1]  Allergies Allergen Reactions   Atorvastatin Other (See Comments)    aches  myalgia  myalgia    aches  myalgia   Ramipril Other (See Comments) and Cough    cough

## 2024-02-06 NOTE — Plan of Care (Signed)
   Problem: Education: Goal: Knowledge of General Education information will improve Description: Including pain rating scale, medication(s)/side effects and non-pharmacologic comfort measures Outcome: Progressing   Problem: Clinical Measurements: Goal: Ability to maintain clinical measurements within normal limits will improve Outcome: Progressing   Problem: Clinical Measurements: Goal: Respiratory complications will improve Outcome: Progressing   Problem: Clinical Measurements: Goal: Cardiovascular complication will be avoided Outcome: Progressing

## 2024-02-06 NOTE — Plan of Care (Signed)

## 2024-02-06 NOTE — Progress Notes (Addendum)
 "  Cardiology Progress Note   Patient Name: Jacob Montes Date of Encounter: 02/06/2024  Primary Cardiologist: Timothy Gollan, MD  Subjective   Up and down last night due to nocturia but was able to lie flat relatively comfortably.  No chest pain.  Still coughing - nonproductive this AM. Objective   Inpatient Medications    Scheduled Meds:  aspirin   81 mg Oral Daily   budesonide  (PULMICORT ) nebulizer solution  0.25 mg Nebulization BID   furosemide   60 mg Intravenous BID   ipratropium-albuterol   3 mL Nebulization BID   metoprolol  succinate  25 mg Oral Daily   multivitamin with minerals  1 tablet Oral Daily   pantoprazole   40 mg Oral BID AC   potassium chloride  SA  40 mEq Oral BID   rosuvastatin   40 mg Oral Daily   sodium chloride  flush  3 mL Intravenous Q12H   Continuous Infusions:  heparin  1,100 Units/hr (02/06/24 0400)   PRN Meds: acetaminophen  **OR** acetaminophen , montelukast , morphine  injection, ondansetron  (ZOFRAN ) IV, ondansetron  **OR** [DISCONTINUED] ondansetron  (ZOFRAN ) IV, oxyCODONE , polyethylene glycol, sodium chloride  flush   Vital Signs    Vitals:   02/06/24 0411 02/06/24 0500 02/06/24 0746 02/06/24 0838  BP: (!) 103/45   (!) 109/46  Pulse: 65   65  Resp: 20   18  Temp: 97.7 F (36.5 C)   98.6 F (37 C)  TempSrc:      SpO2: 97%  95% 94%  Weight:  93.8 kg    Height:        Intake/Output Summary (Last 24 hours) at 02/06/2024 0953 Last data filed at 02/06/2024 0532 Gross per 24 hour  Intake 1776.59 ml  Output 3050 ml  Net -1273.41 ml   Filed Weights   02/04/24 0214 02/05/24 0500 02/06/24 0500  Weight: (P) 95 kg 95.1 kg 93.8 kg    Physical Exam   GEN: Well nourished, well developed, in no acute distress.  HEENT: Grossly normal.  Neck: Supple, mod elev JVD, no carotid bruits or masses. Cardiac: RRR, 3/6 SEM throughout, loudest @ USB, 2/6 diast murmur along L sternal border, no rubs or gallops. No clubbing, cyanosis, 2+ bilat LE edema to knees.   Radials 2+, DP/PT 2+ and equal bilaterally.  L radial and L brachial cath sites mildly ecchymotic w/o bleeding/bruit/hematoma. Respiratory:  Respirations regular and unlabored, scattered rhonchi. GI: Soft, nontender, nondistended, BS + x 4. MS: no deformity or atrophy. Skin: warm and dry, no rash. Neuro:  Strength and sensation are intact. Psych: AAOx3.  Normal affect.  Labs    Chemistry Recent Labs  Lab 02/02/24 450 [id number] 02/03/24 0400 02/04/24 0439 02/04/24 0927 02/04/24 0931 02/05/24 0401 02/06/24 0354  NA 140   < > 140   < > 139 138 141  K 4.1   < > 3.7   < > 3.6 3.3* 3.8  CL 100   < > 99  --   --  97* 98  CO2 33*   < > 33*  --   --  33* 36*  GLUCOSE 87   < > 78  --   --  121* 147*  BUN 27*   < > 31*  --   --  29* 22  CREATININE 0.91   < > 1.05  --   --  1.06 1.20  CALCIUM  9.4   < > 9.0  --   --  8.9 9.4  PROT 6.2*  --   --   --   --   --   --  ALBUMIN 3.7  --   --   --   --   --   --   AST 31  --   --   --   --   --   --   ALT 17  --   --   --   --   --   --   ALKPHOS 78  --   --   --   --   --   --   BILITOT 1.4*  --   --   --   --   --   --   GFRNONAA >60   < > >60  --   --  >60 >60  ANIONGAP 7   < > 8  --   --  8 8   < > = values in this interval not displayed.     Hematology Recent Labs  Lab 02/02/24 0819 02/04/24 0927 02/04/24 0931 02/05/24 0401 02/06/24 0354  WBC 4.9  --   --  5.2 5.9  RBC 4.03*  --   --  3.54* 4.12*  HGB 13.0   < > 11.9* 11.4* 13.1  HCT 39.7   < > 35.0* 33.9* 38.9*  MCV 98.5  --   --  95.8 94.4  MCH 32.3  --   --  32.2 31.8  MCHC 32.7  --   --  33.6 33.7  RDW 14.6  --   --  14.6 14.4  PLT 142*  --   --  116* 129*   < > = values in this interval not displayed.    Cardiac Enzymes  Recent Labs  Lab 01/19/24 1019 01/19/24 1234 01/19/24 1649 01/19/24 1842 02/01/24 1042  TRNPT 99* 117* 101* 108* 30*   ProBNP    Component Value Date/Time   PROBNP 2,213.0 (H) 02/01/2024 1042   Lipids  Lab Results  Component Value Date    CHOL 133 10/07/2021   HDL 43 10/07/2021   LDLCALC 67 10/07/2021   TRIG 133 10/07/2021   CHOLHDL 3.1 10/07/2021    HbA1c  Lab Results  Component Value Date   HGBA1C 5.9 (H) 10/07/2021    Radiology    DG Chest Bilateral Decubitus Result Date: 02/04/2024 CLINICAL DATA:  Pleural effusion. EXAM: CHEST - BILATERAL DECUBITUS VIEW COMPARISON:  02/01/2024 FINDINGS: Right-side-down decubitus film shows small layering right-sided pleural effusion. Left-side-down decubitus film shows moderate layering left pleural effusion. Basilar atelectasis or infiltrate again noted. The cardio pericardial silhouette is enlarged. IMPRESSION: Small right and moderate left layering pleural effusions. Electronically Signed   By: Camellia Candle M.D.   On: 02/04/2024 06:15     Telemetry    RSR, occas PVCs - Personally Reviewed  Cardiac Studies   2D Echocardiogram 12.23.2025    1. Left ventricular ejection fraction, by estimation, is 50 to 55%. The  left ventricle has low normal function. The left ventricle has no regional  wall motion abnormalities. The left ventricular internal cavity size was  mildly dilated. Left ventricular  diastolic parameters are consistent with Grade II diastolic dysfunction  (pseudonormalization).   2. Right ventricular systolic function was not well visualized. The right  ventricular size is not well visualized.   3. Left atrial size was moderately dilated.   4. Right atrial size was mildly dilated.   5. The mitral valve is degenerative. Mild mitral valve regurgitation.  Mild calcific mitral stenosis. Severe mitral annular calcification.   6. There is a 23 mm Sapien prosthetic (TAVR)  valve present in the aortic  position. Procedure Date: 01/19/2017. Echo findings are consistent with  severe stenosis and moderate perivalvular leak of the aortic prosthesis.  Aortic valve mean gradient measures   54.0 mmHg. Aortic valve Vmax measures 4.52 m/s. DVI 0.13.   7. The inferior vena  cava is dilated in size with >50% respiratory  variability, suggesting right atrial pressure of 8 mmHg. _____________   Cardiac Catheterization  12.26.2025  Diagnostic Dominance: Co-dominant  1.  Significant underlying three-vessel coronary artery disease with patent grafts including LIMA to LAD, SVG to OM1 and SVG to OM 3.  There is significant disease in the right coronary artery but the vessel is small.  Significant ostial left main stenosis supplying only a first diagonal branch. 2.  Left ventricular angiography was not performed.  EF was normal by echo.  I did not attempt to cross the TAVR prosthesis given concerns about leaflet thrombosis. 3.  Right heart catheterization showed normal right atrial pressure, moderate pulmonary hypertension, moderately elevated wedge pressure and normal cardiac output. RA: 3 mmHg, RV is 66/2 mmHg, PW is 22 mmHg with prominent V wave, PA: 55/17 with a mean of 30 mmHg.  Cardiac output/cardiac index: 6.31/3.1.   Recommendations:  start heparin  drip 2 hours after TR band removal due to concerns about TAVR leaflet thrombosis. Continue IV diuresis. Recommend medical therapy for coronary artery disease.  No revascularization is needed. Continue evaluation for aortic valve valve in valve procedure. 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   Cath this admission w/ multivessel CAD with 3 of 3 patent grafts and no targets for intervention, along with pulmonary hypertension and wedge of 22.  Assessment & Plan    1. Acute hypoxemic respiratory failure/acute on chronic HFpEF/Bilateral Pleural effusions: Patient admitted for the second time in the month of December due to progressive dyspnea on exertion and volume overload in the setting of progressive bioprosthetic aortic valve stenosis. Saturating in the 80s at office visit on December 23. proBNP on admission 2213 and chest x-ray showing pulmonary vascular congestion and bilateral pleural effusions (small to mod - unchanged from earlier this month). Echo this admission with an EF of 50 to 55% and grade 2 diastolic dysfunction, with presence of severe aortic stenosis and mean aortic valve gradient of 54 mmHg.  Right and left heart cath showed persistently elevated filling pressures [wedge of 22, PA 55/17 (30)], and 3 of 3 patent grafts with native multivessel disease.  No targets for intervention.  Responded well to intravenous Lasix  + metolazone  2.5 mg yesterday and was minus 1.2 L.  Wt down 1.3 kg since yesterday.  Renal function relatively stable to creatinine slightly higher at 1.20.  He continues to have significant lower extremity edema and coarse breath sounds.  Will give additional 2.5 mg metolazone  today as he seems to respond better to this than to furosemide  by itself.  2.  Severe bioprosthetic aortic valve stenosis:  s/p TAVR w/ 23mm Sapien valve in [place], [place] in 01/2017.  Noted in outpt setting to have progressive AoV stenosis complicated by progressive dyspnea, volume overload, and now his second admission this month due to heart failure.  Echo this admission w/ mean AoV gradient of 54 mmHg consistent with severe stenosis and moderate perivalvular leak.  Volume overloaded as outlined above.  Continue diuresis.  Structural heart team to see tomorrow with plan for CT to rule out valve  thrombosis.  Continue heparin .  3.  CAD/demand ischemia: History of CABG x 3 in 2009 with low risk Myoview  November 2025.  Diagnostic catheterization this admission with 3 of 3 patent grafts and severe native disease without targets for intervention.  Continue aspirin , beta-blocker, and statin therapy.  4.  Primary hypertension: Pressure stable on beta-blocker and diuretic therapy.  He is off of prior home dose of amlodipine , which may have been contributing some to his lower extremity edema.  5.  Hyperlipidemia: Continue rosuvastatin  40 mg-increase this admission in the setting of an LDL of 76 earlier this year.  No complaints of myalgias, which he experienced previously on atorvastatin.  Hypokalemia: Stable at 3.8 on potassium supplementation.  Signed, Lonni Meager, NP  02/06/2024, 9:53 AM    For questions or updates, please contact   Please consult [email or web address] for contact info under Cardiology/STEMI.  "

## 2024-02-06 NOTE — Discharge Summary (Signed)
 "    Physician Discharge Summary   Patient: Jacob Montes MRN: [id number]  DOB: 07-22-1940   Admit:     Date of Admission: 02/01/2024 Admitted from: home   Discharge: Date of discharge: 02/06/2024 Disposition: [place] hospital Condition at discharge: stable  CODE STATUS: FULL CODE     Discharge Physician: Laneta Blunt, DO [facility]     PCP: [name], MD  Recommendations for Outpatient Follow-up:  Follow up pending clinical course   Discharge Instructions     Discharge patient   Complete by: As directed    Jolynn Pack   Discharge disposition: [facility] Not Defined   Discharge patient date: 02/06/2024         Discharge Diagnoses: Principal Problem:   CHF (congestive heart failure) (HCC) Active Problems:   History of CAD   Morbid obesity (HCC)   Benign essential hypertension   Dyslipidemia   Aortic valve disorder   Acute on chronic heart failure with preserved ejection fraction (HFpEF) (HCC) See assessment/plan       Hospital course / significant events:   HPI: Jacob Montes is a pleasant 83 y.o. 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  Patient was in the hospital between 01/19/2024 and 02/08/2024 for decompensated heart failure.  Patient was treated with IV antibiotic with some improvement.  Patient was followed up in cardiology clinic on 01/27/2024, he was treated with IV diuretics then, today patient went to cardiology office where he reported not feeling well rather his swelling did not get better and got worse.  He complained of significant shortness of breath, orthopnea, hypoxia and lower extremity edema.  Oxygen saturation at the clinic today was  84% on room air.  Dr. Argentina from cardiology transferred the patient for further diuresis and treatment of congestive heart failure.  Patient denies any fever, chills, nausea, vomiting.  He complains of progressive worsening shortness of breath with leg swelling.  He also complains of some orthopnea denies any PND.  Denies any palpitations.   12/23: Upon arrival to the ED, patient is found to be tachypneic at 22, hypoxic requiring oxygen, room air oxygen saturation was 84%, 92% on 2 L.  Patient was given 80 mg of IV Lasix  with some improvement.  Hospitalist service was consulted for evaluation for admission for acute congestive heart failure exacerbation with hypoxia requiring IV diuresis. 12/24: Per cardiology - initial plan this morning was transfer to [facility] under Dr Delford for structural heart eval/imaging, through the morning this plan evolved to now getting cardiac cath this Friday 12/26 and likely to [facility] from there  12/25: pt reports worsening productive cough, CXR does look like some pleural effusion probably mild/moderate, repeated Flu/COVID as we have seen multiple flu cases, this was negative. Per cardiology - increased lasix  12/26: cardiac cath today - progressive aortic stenosis question leaflet thrombosis, started heparin  gtt. Plan for to [facility] on Sunday 12/28 12/27: continues to diurese  12/28: stable for transport to [facility]      Consultants:  Cardiology - [facility]   Procedures/Surgeries: none      ASSESSMENT & PLAN:    Acute exacerbation of congestive heart failure with hypoxia Last echo EF from 50 to 55%. IV and p.o. Lasix   outpatient failed. Diuresis  Strict I&O, daily weight, salt/fluid restriction Cardiology following - plan transfer to [facility] 12/28 for structural heart eval/imaging   History of CAD s/p CABG, AS s/p TAVR Continue aspirin , metoprolol , rosuvastatin   Acute hypoxic respiratory failure  He was saturating 84% on room air. Improved now 94% RA  more comfortable on O3  Supplemental O2 as needed  Lasix  per cardiology   HTN Blood pressure has been stable Continue amlodipine  10 mg daily, metoprolol  25 mg,  Diuresis as above Cardiology following   HLD Continue on rosuvastatin  20 mg   History of asthma Wheezing on exam  Continue Singulair  DuoNeb and Pulmicort  nebs scheduled        N/a based on BMI: There is no height or weight on file to calculate BMI.. Significantly low or high BMI is associated with higher medical risk.  Underweight - under 18  overweight - 25 to 29 obese - 30 or more Class 1 obesity: BMI of 30.0 to 34 Class 2 obesity: BMI of 35.0 to 39 Class 3 obesity: BMI of 40.0 to 49 Super Morbid Obesity: BMI 50-59 Super-super Morbid Obesity: BMI 60+ Healthy nutrition and physical activity advised as adjunct to other disease management and risk reduction treatments    DVT prophylaxis: lovenox  IV fluids: no continuous IV fluids  Nutrition: cardiac diet Central lines / other devices: none  Code Status: FULL CODE ACP documentation reviewed:  none on file in [facility]  TOC needs: TBD Medical barriers to dispo: diuresin thru weekend. Expected medical readiness for transfer to [facility] likely 02/06/24.             Discharge Instructions  Allergies as of 02/06/2024       Reactions   Atorvastatin Other (See Comments)   aches myalgia myalgia    aches  myalgia   Ramipril Other (See Comments), Cough   cough        Medication List     STOP taking these medications    amLODipine  10 MG tablet Commonly known as: NORVASC    cetirizine 10 MG tablet Commonly known as: ZYRTEC   docusate sodium  250 MG capsule Commonly known as: COLACE   fluticasone 50 MCG/ACT nasal spray Commonly known as: FLONASE   furosemide  40 MG tablet Commonly known as: LASIX  Replaced by: furosemide  10 MG/ML injection   omeprazole 20 MG capsule Commonly known as: PRILOSEC   ondansetron  4 MG disintegrating  tablet Commonly known as: ZOFRAN -ODT   Potassium Chloride  ER 20 MEQ Tbcr Replaced by: potassium chloride  SA 20 MEQ tablet   PROBIOTIC ACIDOPHILUS PO   tamsulosin  0.4 MG Caps capsule Commonly known as: FLOMAX        TAKE these medications    acetaminophen  325 MG tablet Commonly known as: TYLENOL  Take 2 tablets (650 mg total) by mouth every 6 (six) hours as needed for mild pain (pain score 1-3) or fever (or Fever >/= 101).   aspirin  81 MG chewable tablet Chew 81 mg by mouth daily.   budesonide  0.25 MG/2ML nebulizer solution Commonly known as: PULMICORT  Take 2 mLs (0.25 mg total) by nebulization 2 (two) times daily.   furosemide  10 MG/ML injection Commonly known as: LASIX  Inject 4 mLs (40 mg total) into the vein 2 (two) times daily. Replaces: furosemide  40 MG tablet   heparin  25000 UT/250ML infusion Inject 1,100 Units/hr into the vein continuous.   ipratropium-albuterol  0.5-2.5 (3) MG/3ML Soln Commonly known as: DUONEB Take 3 mLs by nebulization 2 (two) times daily.   metoprolol  succinate  25 MG 24 hr tablet Commonly known as: TOPROL -XL Take 1 tablet (25 mg total) by mouth daily. What changed: additional instructions   montelukast  10 MG tablet Commonly known as: SINGULAIR  Take 1 tablet (10 mg total) by mouth at bedtime as needed (allergies).   morphine  (PF) 2 MG/ML injection Inject 1 mL (2 mg total) into the vein every 4 (four) hours as needed.   multivitamin with minerals Tabs tablet Take 1 tablet by mouth daily. Start taking on: February 07, 2024   ondansetron  4 MG tablet Commonly known as: ZOFRAN  Take 1 tablet (4 mg total) by mouth every 6 (six) hours as needed for nausea.   ondansetron  4 MG/2ML Soln injection Commonly known as: ZOFRAN  Inject 2 mLs (4 mg total) into the vein every 6 (six) hours as needed for nausea.   oxyCODONE  5 MG immediate release tablet Commonly known as: Oxy IR/ROXICODONE  Take 1 tablet (5 mg total) by mouth every 4 (four) hours as  needed for moderate pain (pain score 4-6).   pantoprazole  40 MG tablet Commonly known as: PROTONIX  Take 1 tablet (40 mg total) by mouth 2 (two) times daily before a meal.   polyethylene glycol 17 g packet Commonly known as: MIRALAX  / GLYCOLAX  Take 17 g by mouth daily as needed for mild constipation.   potassium chloride  SA 20 MEQ tablet Commonly known as: KLOR-CON  M Take 2 tablets (40 mEq total) by mouth 2 (two) times daily. What changed:  how much to take when to take this Replaces: Potassium Chloride  ER 20 MEQ Tbcr   rosuvastatin  40 MG tablet Commonly known as: CRESTOR  Take 1 tablet (40 mg total) by mouth daily. What changed:  medication strength how much to take   sodium chloride  flush 0.9 % Soln Commonly known as: NS Inject 3 mLs into the vein every 12 (twelve) hours.   sodium chloride  flush 0.9 % Soln Commonly known as: NS Inject 3 mLs into the vein as needed.         Follow-up Information     [facility]. Go on 02/11/2024.   Specialty: Cardiology Why: Hospital Follow-Up 02/11/2024 @ 8:30 AM Please bring all medications to follow-up appointment [facility], [address] Free Valet Parking at the door. Contact information: [address] [place]  [address] [phone]                Allergies[1]   Subjective: pt feeling well today, still some SOB, no chest pain    Discharge Exam: BP (!) 120/48   Pulse 71   Temp 98.2 F (36.8 C)   Resp 17   Ht 5' 6 (1.676 m)   Wt 93.8 kg   SpO2 90%   BMI 33.38 kg/m  General: Pt is alert, awake, not in acute distress Cardiovascular: RRR, S1/S2 +, no rubs, no gallops Respiratory: CTA bilaterally, no wheezing, no rhonchi Extremities: +1 edema, no cyanosis     The results of significant diagnostics from this hospitalization (including imaging, microbiology, ancillary and laboratory) are listed below for  reference.     Microbiology: Recent Results (from the past 240 hours)  Resp panel by RT-PCR (RSV, Flu A&B, Covid) Anterior Nasal Swab     Status: None   Collection Time: 02/01/24 11:56 AM   Specimen: Anterior Nasal Swab  Result Value Ref Range Status   SARS Coronavirus 2 by RT PCR NEGATIVE NEGATIVE Final    Comment: (NOTE) SARS-CoV-2 target nucleic acids  are NOT DETECTED.  The SARS-CoV-2 RNA is generally detectable in upper respiratory specimens during the acute phase of infection. The lowest concentration of SARS-CoV-2 viral copies this assay can detect is 138 copies/mL. A negative result does not preclude SARS-Cov-2 infection and should not be used as the sole basis for treatment or other patient management decisions. A negative result may occur with  improper specimen collection/handling, submission of specimen other than nasopharyngeal swab, presence of viral mutation(s) within the areas targeted by this assay, and inadequate number of viral copies(<138 copies/mL). A negative result must be combined with clinical observations, patient history, and epidemiological information. The expected result is Negative.  Fact Sheet for Patients:  bloggercourse.com  Fact Sheet for Healthcare Providers:  seriousbroker.it  This test is no t yet approved or cleared by the [place]  FDA and  has been authorized for detection and/or diagnosis of SARS-CoV-2 by FDA under an Emergency Use Authorization (EUA). This EUA will remain  in effect (meaning this test can be used) for the duration of the COVID-19 declaration under Section 564(b)(1) of the Act, 21 U.S.C.section 360bbb-3(b)(1), unless the authorization is terminated  or revoked sooner.       Influenza A by PCR NEGATIVE NEGATIVE Final   Influenza B by PCR NEGATIVE NEGATIVE Final    Comment: (NOTE) The Xpert Xpress SARS-CoV-2/FLU/RSV plus assay is intended as an aid in the  diagnosis of influenza from Nasopharyngeal swab specimens and should not be used as a sole basis for treatment. Nasal washings and aspirates are unacceptable for Xpert Xpress SARS-CoV-2/FLU/RSV testing.  Fact Sheet for Patients: bloggercourse.com  Fact Sheet for Healthcare Providers: seriousbroker.it  This test is not yet approved or cleared by the [place]  FDA and has been authorized for detection and/or diagnosis of SARS-CoV-2 by FDA under an Emergency Use Authorization (EUA). This EUA will remain in effect (meaning this test can be used) for the duration of the COVID-19 declaration under Section 564(b)(1) of the Act, 21 U.S.C. section 360bbb-3(b)(1), unless the authorization is terminated or revoked.     Resp Syncytial Virus by PCR NEGATIVE NEGATIVE Final    Comment: (NOTE) Fact Sheet for Patients: bloggercourse.com  Fact Sheet for Healthcare Providers: seriousbroker.it  This test is not yet approved or cleared by the [place]  FDA and has been authorized for detection and/or diagnosis of SARS-CoV-2 by FDA under an Emergency Use Authorization (EUA). This EUA will remain in effect (meaning this test can be used) for the duration of the COVID-19 declaration under Section 564(b)(1) of the Act, 21 U.S.C. section 360bbb-3(b)(1), unless the authorization is terminated or revoked.  Performed at [facility], [address] [place], [place] [address]   Resp panel by RT-PCR (RSV, Flu A&B, Covid) Anterior Nasal Swab     Status: None   Collection Time: 02/03/24 12:25 PM   Specimen: Anterior Nasal Swab  Result Value Ref Range Status   SARS Coronavirus 2 by RT PCR NEGATIVE NEGATIVE Final    Comment: (NOTE) SARS-CoV-2 target nucleic acids are NOT DETECTED.  The SARS-CoV-2 RNA is generally detectable in upper respiratory specimens during the acute phase of  infection. The lowest concentration of SARS-CoV-2 viral copies this assay can detect is 138 copies/mL. A negative result does not preclude SARS-Cov-2 infection and should not be used as the sole basis for treatment or other patient management decisions. A negative result may occur with  improper specimen collection/handling, submission of specimen other than nasopharyngeal swab, presence of viral mutation(s) within the  areas targeted by this assay, and inadequate number of viral copies(<138 copies/mL). A negative result must be combined with clinical observations, patient history, and epidemiological information. The expected result is Negative.  Fact Sheet for Patients:  bloggercourse.com  Fact Sheet for Healthcare Providers:  seriousbroker.it  This test is no t yet approved or cleared by the [place]  FDA and  has been authorized for detection and/or diagnosis of SARS-CoV-2 by FDA under an Emergency Use Authorization (EUA). This EUA will remain  in effect (meaning this test can be used) for the duration of the COVID-19 declaration under Section 564(b)(1) of the Act, 21 U.S.C.section 360bbb-3(b)(1), unless the authorization is terminated  or revoked sooner.       Influenza A by PCR NEGATIVE NEGATIVE Final   Influenza B by PCR NEGATIVE NEGATIVE Final    Comment: (NOTE) The Xpert Xpress SARS-CoV-2/FLU/RSV plus assay is intended as an aid in the diagnosis of influenza from Nasopharyngeal swab specimens and should not be used as a sole basis for treatment. Nasal washings and aspirates are unacceptable for Xpert Xpress SARS-CoV-2/FLU/RSV testing.  Fact Sheet for Patients: bloggercourse.com  Fact Sheet for Healthcare Providers: seriousbroker.it  This test is not yet approved or cleared by the [place]  FDA and has been authorized for detection and/or diagnosis of SARS-CoV-2  by FDA under an Emergency Use Authorization (EUA). This EUA will remain in effect (meaning this test can be used) for the duration of the COVID-19 declaration under Section 564(b)(1) of the Act, 21 U.S.C. section 360bbb-3(b)(1), unless the authorization is terminated or revoked.     Resp Syncytial Virus by PCR NEGATIVE NEGATIVE Final    Comment: (NOTE) Fact Sheet for Patients: bloggercourse.com  Fact Sheet for Healthcare Providers: seriousbroker.it  This test is not yet approved or cleared by the [place]  FDA and has been authorized for detection and/or diagnosis of SARS-CoV-2 by FDA under an Emergency Use Authorization (EUA). This EUA will remain in effect (meaning this test can be used) for the duration of the COVID-19 declaration under Section 564(b)(1) of the Act, 21 U.S.C. section 360bbb-3(b)(1), unless the authorization is terminated or revoked.  Performed at [facility], [address] [place], [place] [address]      Labs: BNP (last 3 results) No results for input(s): BNP in the last 8760 hours. Basic Metabolic Panel: Recent Labs  Lab 02/02/24 0819 02/03/24 0400 02/04/24 0439 02/04/24 0927 02/04/24 0931 02/05/24 0401 02/06/24 0354  NA 140 140 140 138 139 138 141  K 4.1 4.4 3.7 3.7 3.6 3.3* 3.8  CL 100 99 99  --   --  97* 98  CO2 33* 31 33*  --   --  33* 36*  GLUCOSE 87 94 78  --   --  121* 147*  BUN 27* 31* 31*  --   --  29* 22  CREATININE 0.91 0.99 1.05  --   --  1.06 1.20  CALCIUM  9.4 9.2 9.0  --   --  8.9 9.4   Liver Function Tests: Recent Labs  Lab 02/02/24 0819  AST 31  ALT 17  ALKPHOS 78  BILITOT 1.4*  PROT 6.2*  ALBUMIN 3.7   No results for input(s): LIPASE, AMYLASE in the last 168 hours. No results for input(s): AMMONIA in the last 168 hours. CBC: Recent Labs  Lab 02/01/24 1042 02/02/24 0819 02/04/24 0927 02/04/24 0931 02/05/24 0401 02/06/24 0354  WBC 5.4  4.9  --   --  5.2 5.9  HGB  12.9* 13.0 11.6* 11.9* 11.4* 13.1  HCT 38.6* 39.7 34.0* 35.0* 33.9* 38.9*  MCV 97.5 98.5  --   --  95.8 94.4  PLT 133* 142*  --   --  116* 129*   Cardiac Enzymes: No results for input(s): CKTOTAL, CKMB, CKMBINDEX, TROPONINI in the last 168 hours. BNP: Invalid input(s): POCBNP CBG: No results for input(s): GLUCAP in the last 168 hours. D-Dimer No results for input(s): DDIMER in the last 72 hours. Hgb A1c No results for input(s): HGBA1C in the last 72 hours. Lipid Profile No results for input(s): CHOL, HDL, LDLCALC, TRIG, CHOLHDL, LDLDIRECT in the last 72 hours. Thyroid  function studies No results for input(s): TSH, T4TOTAL, T3FREE, THYROIDAB in the last 72 hours.  Invalid input(s): FREET3 Anemia work up No results for input(s): VITAMINB12, FOLATE, FERRITIN, TIBC, IRON, RETICCTPCT in the last 72 hours. Urinalysis    Component Value Date/Time   COLORURINE YELLOW (A) 03/04/2023 1311   APPEARANCEUR CLEAR (A) 03/04/2023 1311   LABSPEC 1.012 03/04/2023 1311   PHURINE 5.0 03/04/2023 1311   GLUCOSEU NEGATIVE 03/04/2023 1311   HGBUR NEGATIVE 03/04/2023 1311   BILIRUBINUR NEGATIVE 03/04/2023 1311   BILIRUBINUR Negative 10/14/2021 1059   KETONESUR NEGATIVE 03/04/2023 1311   PROTEINUR NEGATIVE 03/04/2023 1311   UROBILINOGEN 0.2 10/14/2021 1059   NITRITE NEGATIVE 03/04/2023 1311   LEUKOCYTESUR NEGATIVE 03/04/2023 1311   Sepsis Labs Recent Labs  Lab 02/01/24 1042 02/02/24 0819 02/05/24 0401 02/06/24 0354  WBC 5.4 4.9 5.2 5.9   Microbiology Recent Results (from the past 240 hours)  Resp panel by RT-PCR (RSV, Flu A&B, Covid) Anterior Nasal Swab     Status: None   Collection Time: 02/01/24 11:56 AM   Specimen: Anterior Nasal Swab  Result Value Ref Range Status   SARS Coronavirus 2 by RT PCR NEGATIVE NEGATIVE Final    Comment: (NOTE) SARS-CoV-2 target nucleic acids are NOT DETECTED.  The SARS-CoV-2  RNA is generally detectable in upper respiratory specimens during the acute phase of infection. The lowest concentration of SARS-CoV-2 viral copies this assay can detect is 138 copies/mL. A negative result does not preclude SARS-Cov-2 infection and should not be used as the sole basis for treatment or other patient management decisions. A negative result may occur with  improper specimen collection/handling, submission of specimen other than nasopharyngeal swab, presence of viral mutation(s) within the areas targeted by this assay, and inadequate number of viral copies(<138 copies/mL). A negative result must be combined with clinical observations, patient history, and epidemiological information. The expected result is Negative.  Fact Sheet for Patients:  bloggercourse.com  Fact Sheet for Healthcare Providers:  seriousbroker.it  This test is no t yet approved or cleared by the [place]  FDA and  has been authorized for detection and/or diagnosis of SARS-CoV-2 by FDA under an Emergency Use Authorization (EUA). This EUA will remain  in effect (meaning this test can be used) for the duration of the COVID-19 declaration under Section 564(b)(1) of the Act, 21 U.S.C.section 360bbb-3(b)(1), unless the authorization is terminated  or revoked sooner.       Influenza A by PCR NEGATIVE NEGATIVE Final   Influenza B by PCR NEGATIVE NEGATIVE Final    Comment: (NOTE) The Xpert Xpress SARS-CoV-2/FLU/RSV plus assay is intended as an aid in the diagnosis of influenza from Nasopharyngeal swab specimens and should not be used as a sole basis for treatment. Nasal washings and aspirates are unacceptable for Xpert Xpress SARS-CoV-2/FLU/RSV testing.  Fact Sheet for Patients: bloggercourse.com  Fact Sheet for Healthcare Providers: seriousbroker.it  This test is not yet approved or cleared by the  [place]  FDA and has been authorized for detection and/or diagnosis of SARS-CoV-2 by FDA under an Emergency Use Authorization (EUA). This EUA will remain in effect (meaning this test can be used) for the duration of the COVID-19 declaration under Section 564(b)(1) of the Act, 21 U.S.C. section 360bbb-3(b)(1), unless the authorization is terminated or revoked.     Resp Syncytial Virus by PCR NEGATIVE NEGATIVE Final    Comment: (NOTE) Fact Sheet for Patients: bloggercourse.com  Fact Sheet for Healthcare Providers: seriousbroker.it  This test is not yet approved or cleared by the [place]  FDA and has been authorized for detection and/or diagnosis of SARS-CoV-2 by FDA under an Emergency Use Authorization (EUA). This EUA will remain in effect (meaning this test can be used) for the duration of the COVID-19 declaration under Section 564(b)(1) of the Act, 21 U.S.C. section 360bbb-3(b)(1), unless the authorization is terminated or revoked.  Performed at [facility], [address] [place], [place] [address]   Resp panel by RT-PCR (RSV, Flu A&B, Covid) Anterior Nasal Swab     Status: None   Collection Time: 02/03/24 12:25 PM   Specimen: Anterior Nasal Swab  Result Value Ref Range Status   SARS Coronavirus 2 by RT PCR NEGATIVE NEGATIVE Final    Comment: (NOTE) SARS-CoV-2 target nucleic acids are NOT DETECTED.  The SARS-CoV-2 RNA is generally detectable in upper respiratory specimens during the acute phase of infection. The lowest concentration of SARS-CoV-2 viral copies this assay can detect is 138 copies/mL. A negative result does not preclude SARS-Cov-2 infection and should not be used as the sole basis for treatment or other patient management decisions. A negative result may occur with  improper specimen collection/handling, submission of specimen other than nasopharyngeal swab, presence of viral mutation(s)  within the areas targeted by this assay, and inadequate number of viral copies(<138 copies/mL). A negative result must be combined with clinical observations, patient history, and epidemiological information. The expected result is Negative.  Fact Sheet for Patients:  bloggercourse.com  Fact Sheet for Healthcare Providers:  seriousbroker.it  This test is no t yet approved or cleared by the [place]  FDA and  has been authorized for detection and/or diagnosis of SARS-CoV-2 by FDA under an Emergency Use Authorization (EUA). This EUA will remain  in effect (meaning this test can be used) for the duration of the COVID-19 declaration under Section 564(b)(1) of the Act, 21 U.S.C.section 360bbb-3(b)(1), unless the authorization is terminated  or revoked sooner.       Influenza A by PCR NEGATIVE NEGATIVE Final   Influenza B by PCR NEGATIVE NEGATIVE Final    Comment: (NOTE) The Xpert Xpress SARS-CoV-2/FLU/RSV plus assay is intended as an aid in the diagnosis of influenza from Nasopharyngeal swab specimens and should not be used as a sole basis for treatment. Nasal washings and aspirates are unacceptable for Xpert Xpress SARS-CoV-2/FLU/RSV testing.  Fact Sheet for Patients: bloggercourse.com  Fact Sheet for Healthcare Providers: seriousbroker.it  This test is not yet approved or cleared by the [place]  FDA and has been authorized for detection and/or diagnosis of SARS-CoV-2 by FDA under an Emergency Use Authorization (EUA). This EUA will remain in effect (meaning this test can be used) for the duration of the COVID-19 declaration under Section 564(b)(1) of the Act, 21 U.S.C. section 360bbb-3(b)(1), unless the authorization is terminated or revoked.     Resp Syncytial Virus  by PCR NEGATIVE NEGATIVE Final    Comment: (NOTE) Fact Sheet for  Patients: bloggercourse.com  Fact Sheet for Healthcare Providers: seriousbroker.it  This test is not yet approved or cleared by the [place]  FDA and has been authorized for detection and/or diagnosis of SARS-CoV-2 by FDA under an Emergency Use Authorization (EUA). This EUA will remain in effect (meaning this test can be used) for the duration of the COVID-19 declaration under Section 564(b)(1) of the Act, 21 U.S.C. section 360bbb-3(b)(1), unless the authorization is terminated or revoked.  Performed at [facility], [address] [place], [place] [address]    Imaging ECHOCARDIOGRAM COMPLETE Result Date: 02/02/2024    ECHOCARDIOGRAM REPORT   Patient Name:   JAISHAUN MCNAB Date of Exam: 02/01/2024 Medical Rec #:  [id number]     Height:       66.0 in Accession #:    [id number]    Weight:       209.0 lb Date of Birth:  1940/07/09     BSA:          2.038 m Patient Age:    83 years      BP:           112/42 mmHg Patient Gender: M             HR:           77 bpm. Exam Location:  [facility] Procedure: 2D Echo, Cardiac Doppler, Color Doppler and Intracardiac            Opacification Agent (Both Spectral and Color Flow Doppler were            utilized during procedure). Indications:     I50.31 Acute Diastolic Heart Failure  History:         Patient has prior history of Echocardiogram examinations, most                  recent 11/11/2023. CAD, TAVR. Left Carotid Endarterectomy. and                  Prior CABG, Stroke; Risk Factors:Dyslipidemia, Hypertension,                  Sleep Apnea and Former Smoker. Pulmonary embolism.                  Aortic Valve: 23 mm Sapien prosthetic, stented (TAVR) valve is                  present in the aortic position. Procedure Date: 01/19/2017.  Sonographer:     Carl Coma RDCS Referring Phys:  [id number] [facility] PAUDEL Diagnosing Phys: Caron Poser IMPRESSIONS  1. Left ventricular ejection fraction, by  estimation, is 50 to 55%. The left ventricle has low normal function. The left ventricle has no regional wall motion abnormalities. The left ventricular internal cavity size was mildly dilated. Left ventricular diastolic parameters are consistent with Grade II diastolic dysfunction (pseudonormalization).  2. Right ventricular systolic function was not well visualized. The right ventricular size is not well visualized.  3. Left atrial size was moderately dilated.  4. Right atrial size was mildly dilated.  5. The mitral valve is degenerative. Mild mitral valve regurgitation. Mild calcific mitral stenosis. Severe mitral annular calcification.  6. There is a 23 mm Sapien prosthetic (TAVR) valve present in the aortic position. Procedure Date: 01/19/2017. Echo findings are consistent with severe stenosis and moderate perivalvular leak of the aortic prosthesis. Aortic valve mean  gradient measures  54.0 mmHg. Aortic valve Vmax measures 4.52 m/s. DVI 0.13.  7. The inferior vena cava is dilated in size with >50% respiratory variability, suggesting right atrial pressure of 8 mmHg. Comparison(s): A prior study was performed on 11/11/2023. TAVR indices now meet threshold for severe stenosis. Vmax previously 3.64, MG 32 mmHg, DVI 0.26. Recommend valve team consult. FINDINGS  Left Ventricle: Left ventricular ejection fraction, by estimation, is 50 to 55%. The left ventricle has low normal function. The left ventricle has no regional wall motion abnormalities. Definity  contrast agent was given IV to delineate the left ventricular endocardial borders. The left ventricular internal cavity size was mildly dilated. There is no left ventricular hypertrophy. Left ventricular diastolic parameters are consistent with Grade II diastolic dysfunction (pseudonormalization). Right Ventricle: The right ventricular size is not well visualized. Right vetricular wall thickness was not well visualized. Right ventricular systolic function was not well  visualized. Left Atrium: Left atrial size was moderately dilated. Right Atrium: Right atrial size was mildly dilated. Pericardium: There is no evidence of pericardial effusion. Mitral Valve: The mitral valve is degenerative in appearance. There is severe calcification of the mitral valve leaflet(s). Moderate mitral annular calcification. Mild mitral valve regurgitation. Mild mitral valve stenosis. Tricuspid Valve: The tricuspid valve is not well visualized. Tricuspid valve regurgitation is not demonstrated. No evidence of tricuspid stenosis. There is a 23 mm Sapien prosthetic, stented (TAVR) valve present in the aortic position. Procedure Date: 01/19/2017. Pulmonic Valve: The pulmonic valve was not well visualized. Pulmonic valve regurgitation is not visualized. No evidence of pulmonic stenosis. Aorta: The aortic root is normal in size and structure. Venous: The inferior vena cava is dilated in size with greater than 50% respiratory variability, suggesting right atrial pressure of 8 mmHg. IAS/Shunts: The interatrial septum was not well visualized.  LEFT VENTRICLE PLAX 2D LVIDd:         6.30 cm Diastology LVIDs:         5.10 cm LV e' medial:    4.57 cm/s LV PW:         1.20 cm LV E/e' medial:  30.4 LV IVS:        0.70 cm LV e' lateral:   4.86 cm/s                        LV E/e' lateral: 28.6  RIGHT VENTRICLE             IVC RV Basal diam:  5.20 cm     IVC diam: 2.10 cm RV S prime:     10.09 cm/s TAPSE (M-mode): 2.2 cm LEFT ATRIUM             Index        RIGHT ATRIUM           Index LA diam:        5.80 cm 2.85 cm/m   RA Area:     18.30 cm LA Vol (A2C):   99.2 ml 48.69 ml/m  RA Volume:   53.50 ml  26.26 ml/m LA Vol (A4C):   79.4 ml 38.97 ml/m LA Biplane Vol: 90.8 ml 44.56 ml/m  AORTIC VALVE AV Vmax:           452.00 cm/s AV Vmean:          331.600 cm/s AV VTI:            0.942 m AV Peak Grad:      81.7 mmHg  AV Mean Grad:      54.0 mmHg LVOT Vmax:         56.93 cm/s LVOT Vmean:        38.600 cm/s LVOT VTI:           0.108 m LVOT/AV VTI ratio: 0.12 AI PHT:            199 msec  AORTA Ao Root diam: 2.90 cm MITRAL VALVE MV Area (PHT): 4.26 cm     SHUNTS MV Decel Time: 178 msec     Systemic VTI: 0.11 m MR Peak grad: 145.7 mmHg MR Mean grad: 85.5 mmHg MR Vmax:      603.50 cm/s MR Vmean:     430.0 cm/s MV E velocity: 139.00 cm/s MV A velocity: 73.85 cm/s MV E/A ratio:  1.88 Caron Poser Electronically signed by Caron Poser Signature Date/Time: 02/02/2024/9:07:08 AM    Final    DG Chest 2 View Result Date: 02/01/2024 EXAM: 2 VIEW(S) XRAY OF THE CHEST 02/01/2024 09:25:00 AM COMPARISON: 01/19/2024 CLINICAL HISTORY: 83 year old male with persistent shortness of breath for 1 month. FINDINGS: LUNGS AND PLEURA: Vascular congestion/mild interstitial pulmonary edema. Small to moderate bilateral pleural effusions. Bibasilar airspace opacities. No pneumothorax. No significant change since 01/19/2024. HEART AND MEDIASTINUM: Cardiomegaly. Prosthetic aortic valve. BONES AND SOFT TISSUES: Prior median sternotomy. No acute osseous abnormality. IMPRESSION: 1. Pulmonary vascular congestion with bilateral pleural effusions not significantly changed since December/11/2023 2. No new cardiopulmonary abnormality. Electronically signed by: Helayne Hurst MD 02/01/2024 10:44 AM EST RP Workstation: [id number]      Time coordinating discharge: over 30 minutes  SIGNED:  Laneta Blunt DO [facility]       [1]  Allergies Allergen Reactions   Atorvastatin Other (See Comments)    aches  myalgia  myalgia    aches  myalgia   Ramipril Other (See Comments) and Cough    cough   "

## 2024-02-06 NOTE — Progress Notes (Signed)
 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    Estimated Creatinine Clearance: 50.3 mL/min (by C-G formula based on SCr of 1.2 mg/dL).   Medical History: Past Medical History:  Diagnosis Date   Actinic keratosis    Acute exacerbation of CHF (congestive heart failure) (HCC) 01/19/2024   Aortic stenosis s/p TAVR    a. 01/2017 s/p TAVR - 23mm Sapien; b. 11/2023 Echo: EF 50-55%, GrII DD, nl RV fxn, mild-mod MR, mild Ca2+ MS, Mod AS, AVA 0.84, mean gradient 32 mmHg, mild perivalvular leak; c. 01/2024 Echo: EF 50-55%, no rwma, GrII DD, nl RV fxn, mod dil LA, mildly dil RA, mild MR/MS, sev AS w/ mod perivalvular leak - mean grad , Vmax 4.52 m/s, DVI 0.13.   CAD (coronary artery disease)    a. 07/2007 s/p CABG x 3 [facility] [place]); b. 12/2023 MV: No isch/infarct; c. 01/2024 Cath: LM 95ost/m, LAD 187m, LCX 100ost/p/m, OM3 40, RCA small, 95p, 82m, RPDA fills via collats from LAD. VG->OM1 nl, VG->OM3 nl, LIMA->LAD nl.   Cancer (HCC)    Skin   Carotid artery disease    a. 2009 s/p left sided CEA complicated by RLN parlaysis; b. 02/2023 U/S: no significant dzs.   Cataract    Chronic heart failure with preserved ejection fraction (HFpEF) (HCC)    a. 01/2024 Echo: EF 50-55%, GrII DD, nl RV fxn, mod dil LA, mild MR/MS, severe AS, mod  perivalvular leak; b. 01/2024 RHC: RA 3, RV 66/2, PA 55/17 (30), PCWP 20.   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   GERD (gastroesophageal reflux disease)    HLD (hyperlipidemia)    Hyperlipidemia 10/15/2015   Hypertension    Nephrolithiasis 11/16/2018   Pulmonary embolism (HCC) 08/2010   Skin cancer    Sleep apnea    Stroke (HCC)     Medications:  Scheduled:   aspirin   81 mg Oral Daily   budesonide  (PULMICORT ) nebulizer solution  0.25 mg Nebulization BID   furosemide   60 mg Intravenous BID   ipratropium-albuterol   3 mL Nebulization BID   metoprolol  succinate  25 mg Oral Daily   multivitamin with minerals  1 tablet Oral Daily   pantoprazole   40 mg Oral BID AC   potassium chloride  SA  40 mEq Oral BID   rosuvastatin   40 mg Oral Daily   sodium chloride  flush  3 mL Intravenous Q12H   Infusions:   heparin  1,100 Units/hr (02/06/24 0400)   PRN: acetaminophen  **OR** acetaminophen , montelukast , morphine  injection, ondansetron  (ZOFRAN ) IV, ondansetron  **OR** [DISCONTINUED] ondansetron  (ZOFRAN ) IV, oxyCODONE , polyethylene glycol, sodium chloride   flush  Assessment: 83 y.o. 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 Now s/p [facility] cardiology wishes to start IV heparin  ISO suspected TAVR thrombosis. Cardiology orders are to begin IV heparin  2 hours post radial band removal  (removal at 1115)  Goal of Therapy:  anti-Xa level 0.3-0.7 units/ml Monitor platelets by anticoagulation protocol: Yes  12/26 2203 HL 0.35, therapeutic x 1 12/27 0401 HL 0.48, therapeutic x 2 12/28 0354 HL 0.55, therapeutic x 3   Plan:   --Continue heparin  infusion at 1100 units/hr --Recheck HL daily w/ AM labs while  therapeutic --Continue to monitor H&H and platelets  Rankin CANDIE Dills, PharmD, [facility] 02/06/2024 4:55 AM         [1]  Allergies Allergen Reactions   Atorvastatin Other (See Comments)    aches  myalgia  myalgia    aches  myalgia   Ramipril Other (See Comments) and Cough    cough

## 2024-02-06 NOTE — H&P (Addendum)
 "  History & Physical [facility] Admission 12.28.2025    Patient ID: EVENS MENO MRN: [id number], DOB/AGE: 08/26/1940   Admit date: 02/06/2024   Primary Physician: [name], MD Primary Cardiologist: Evalene Lunger, MD    Patient Profile    83 year old male with a history of CAD status post CABG x 3 in June 2009, severe aortic stenosis status post TAVR in December 2018, hypertension, hyperlipidemia, statin intolerance, stroke, carotid stenosis status post left carotid endarterectomy in 2009 complicated by right laryngeal nerve paralysis, HFpEF, nephrolithiasis, remote tobacco abuse, sleep apnea, and remote PE (08/2010), who is being admitted on transfer from [facility] following admission for progressive heart failure, hypoxia, and dyspnea on 02/01/2024.  Past Medical History    Past Medical History:  Diagnosis Date   Actinic keratosis    Acute exacerbation of CHF (congestive heart failure) (HCC) 01/19/2024   Aortic stenosis s/p TAVR    a. 01/2017 s/p TAVR - 23mm Sapien; b. 11/2023 Echo: EF 50-55%, GrII DD, nl RV fxn, mild-mod MR, mild Ca2+ MS, Mod AS, AVA 0.84, mean gradient 32 mmHg, mild perivalvular leak; c. 01/2024 Echo: EF 50-55%, no rwma, GrII DD, nl RV fxn, mod dil LA, mildly dil RA, mild MR/MS, sev AS w/ mod perivalvular leak - mean grad , Vmax 4.52 m/s, DVI 0.13.   CAD (coronary artery disease)    a. 07/2007 s/p CABG x 3 [facility] [place]); b. 12/2023 MV: No isch/infarct; c. 01/2024 Cath: LM 95ost/m, LAD 141m, LCX 100ost/p/m, OM3 40, RCA small, 95p, 29m, RPDA fills via collats from LAD. VG->OM1 nl, VG->OM3 nl, LIMA->LAD nl.   Cancer (HCC)    Skin   Carotid artery disease    a. 2009 s/p left sided CEA complicated by RLN parlaysis; b. 02/2023 U/S: no significant dzs.   Cataract    Chronic heart failure with preserved ejection fraction (HFpEF) (HCC)    a. 01/2024 Echo: EF 50-55%, GrII DD, nl RV fxn, mod dil LA, mild MR/MS, severe AS, mod perivalvular leak; b. 01/2024 RHC:  RA 3, RV 66/2, PA 55/17 (30), PCWP 20.   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   GERD (gastroesophageal reflux disease)    HLD (hyperlipidemia)    Hyperlipidemia 10/15/2015   Hypertension    Nephrolithiasis 11/16/2018   Pulmonary embolism (HCC) 08/2010   Skin cancer    Sleep apnea    Stroke [facility])     Past Surgical History:  Procedure Laterality Date   CARDIAC CATHETERIZATION     CARDIAC VALVE REPLACEMENT  01-19-17   Carotid surgery Left 03/08/2007   CHOLECYSTECTOMY  02/16/2018   COLONOSCOPY WITH PROPOFOL  N/A 09/01/2022   Procedure: COLONOSCOPY WITH PROPOFOL ;  Surgeon: Maryruth Ole DASEN, MD;  Location: [facility] ENDOSCOPY;  Service: Endoscopy;  Laterality: N/A;   CORONARY ARTERY BYPASS GRAFT     ENDOSCOPIC RETROGRADE CHOLANGIOPANCREATOGRAPHY (ERCP) WITH PROPOFOL  N/A 07/10/2021   Procedure: ENDOSCOPIC RETROGRADE CHOLANGIOPANCREATOGRAPHY (ERCP) WITH PROPOFOL ;  Surgeon: Jinny Carmine, MD;  Location: [facility] ENDOSCOPY;  Service: Endoscopy;  Laterality: N/A;   ERCP N/A 11/18/2018   Procedure: ENDOSCOPIC RETROGRADE CHOLANGIOPANCREATOGRAPHY (ERCP);  Surgeon: Jinny Carmine, MD;  Location: [facility] ENDOSCOPY;  Service: Endoscopy;  Laterality: N/A;   EUS N/A 08/21/2021   Procedure: UPPER ENDOSCOPIC ULTRASOUND (EUS) LINEAR;  Surgeon: Queenie Asberry LABOR, MD;  Location: [facility] ENDOSCOPY;  Service: Gastroenterology;  Laterality: N/A;  [facility]   EYE SURGERY  04-15-99   09-14-99   Heart Bypass N/A 07/2007   HERNIA  REPAIR  03-12-88   Not sure of date   left shoulder surgery Left 06/03/2009   POLYPECTOMY  09/01/2022   Procedure: POLYPECTOMY;  Surgeon: Maryruth Ole DASEN, MD;  Location: [facility] ENDOSCOPY;  Service: Endoscopy;;   RIGHT AND LEFT HEART CATH  07/18/2007   right shoulder surgery  2003   RIGHT/LEFT HEART CATH AND CORONARY/GRAFT ANGIOGRAPHY N/A 02/04/2024   Procedure: RIGHT/LEFT HEART CATH AND CORONARY/GRAFT ANGIOGRAPHY;  Surgeon: Darron Deatrice LABOR, MD;   Location: [facility] INVASIVE CV LAB;  Service: Cardiovascular;  Laterality: N/A;   TEE WITHOUT CARDIOVERSION N/A 11/21/2018   Procedure: TRANSESOPHAGEAL ECHOCARDIOGRAM (TEE);  Surgeon: Darron Deatrice LABOR, MD;  Location: [facility] ORS;  Service: Cardiovascular;  Laterality: N/A;   TVAR       Allergies  [Allergies]  [Allergies] Allergen Reactions   Atorvastatin Other (See Comments)    aches  myalgia  myalgia    aches  myalgia   Ramipril Other (See Comments) and Cough    cough    History of Present Illness    83 year old male with a history of CAD status post CABG x 3 in June 2009, severe aortic stenosis status post TAVR in December 2018, hypertension, hyperlipidemia, statin intolerance, stroke, carotid stenosis status post left carotid endarterectomy in 2009 complicated by right laryngeal nerve paralysis, HFpEF, nephrolithiasis, remote tobacco abuse, sleep apnea, and remote PE (08/2010).  Previously lived in [place], [place]  where he underwent three-vessel bypass in June 2009.  In the setting of progressive aortic stenosis, he underwent TAVR in December 2018.  Notes indicate that pre-TAVR diagnostic catheterization showed stable anatomy, though details are unclear.  He has been followed in our practice since 2020 and serial echocardiography, has shown progressive prosthetic valve aortic stenosis, with echo in October 2025 showing EF of 50 to 55% with moderate aortic stenosis and mean valve gradient of 32 mmHg.  Mild to moderate MR and mild calcific mitral stenosis were also noted at that time.  Unfortunately, over the past few months, he has had progressive dyspnea on exertion and lower extremity edema.  Stress testing in 12/2023 was low risk w/o ischemia/infarct.  He was admitted 12/10  01/22/2024 due to decompensated heart failure.  CT chest was neg for PE.  He was diuresed and d/c'd on supplemental O2.  He required outpt IV diuresis in [facility] [facility] clinic on 12/18 but remained markedly  volume overloaded, dyspneic, and hypoxic (O2 sat 84% on RA) at office follow up on 02/01/2024, prompting readmission.  In the ED, CXR showed vascular congestion w/ small to mod bilat pleural effusions.  BNP up @ 2213.  Labs otw unremarkable.  He was placed on IV lasix  and f/u echo showed EF of 50-55%, GrII DD, and severe AS w/ mean gradient of , DVI 0.13, and Vmax  of 4.50m/s.  Moderate perivalvular leak was also noted.  We consulted our structural heart team in [place], who recommended continued diuresis, R & L heart cath, and eventual transfer to [facility] for CT imaging to r/o thrombosis of the AoV prosthesis.  Pt required escalation of IV diuresis to Lasix  80 IV BID on 12/25 in the setting of ongoing dyspnea, orthopnea, edema, and limited response to a lower dose of lasix .  Decubitus films on 12/25 showed small R and moderate L layering effusions.  He underwent R & L heart cardiac catheterization on 12/26, showing significant underlying three-vessel CAD w/ 3/3 patent grafts (LIMA  LAD, VG  OM1, VG  OM3).  Right heart cath was notable for  pulmonary hypertension in the setting of PCWP of 30/14 (20).  Further diuresis was recommended and he was also placed on heparin  infusion post-catheterization out of concern for potential prosthetic valve leaflet thrombosis.  He has responded well to intravenous diuresis following the addition of once daily metolazone , though remains volume overloaded.  He has been transferred to [facility] today for further evaluation by structural heart team and CT imaging of AoV.  Current Medications    Scheduled Meds:  aspirin   81 mg Oral Daily   budesonide  (PULMICORT ) nebulizer solution  0.25 mg Nebulization BID   furosemide   60 mg Intravenous BID   ipratropium-albuterol   3 mL Nebulization BID   metoprolol  succinate  25 mg Oral Daily   multivitamin with minerals  1 tablet Oral Daily   pantoprazole   40 mg Oral BID AC   potassium chloride  SA  40 mEq Oral BID   rosuvastatin   40 mg  Oral Daily   sodium chloride  flush  3 mL Intravenous Q12H   Continuous Infusions:  heparin  1,100 Units/hr (02/06/24 0400)   PRN Meds:.acetaminophen  **OR** acetaminophen , montelukast , morphine  injection, ondansetron  (ZOFRAN ) IV, ondansetron  **OR** [DISCONTINUED] ondansetron  (ZOFRAN ) IV, oxyCODONE , polyethylene glycol, sodium chloride  flush    Family History    Family History  Problem Relation Age of Onset   Hypertension Mother    Stroke Mother    Lung cancer Father    Alcohol abuse Father    Non-Hodgkin's lymphoma Brother    Varicose Veins Brother    He indicated that his mother is deceased. He indicated that his father is deceased. He indicated that both of his brothers are alive. He indicated that his son is alive.   Social History    Social History   Socioeconomic History   Marital status: Married    Spouse name: Not on file   Number of children: 2   Years of education: Not on file   Highest education level: Some college, no degree  Occupational History   Occupation: retired  Tobacco Use   Smoking status: Former    Current packs/day: 0.00    Types: Cigarettes    Quit date: 1979    Years since quitting: 47.0   Smokeless tobacco: Never   Tobacco comments:    Quit in 1979  Vaping Use   Vaping status: Never Used  Substance and Sexual Activity   Alcohol use: Never   Drug use: Never   Sexual activity: Yes    Partners: Female  Other Topics Concern   Not on file  Social History Narrative   Not on file   Social Drivers of Health   Tobacco Use: Medium Risk (02/01/2024)   Patient History    Smoking Tobacco Use: Former    Smokeless Tobacco Use: Never    Passive Exposure: Not on Actuary Strain: Low Risk  (12/02/2023)   Received from [facility]   Overall Financial Resource Strain (CARDIA)    Difficulty of Paying Living Expenses: Not hard at all  Food Insecurity: No Food Insecurity (02/03/2024)   Epic    Worried About Chiropractor of Food in the Last Year: Never true    Ran Out of Food in the Last Year: Never true  Transportation Needs: No Transportation Needs (02/03/2024)   Epic    Lack of Transportation (Medical): No    Lack of Transportation (Non-Medical): No  Physical Activity: Inactive (12/02/2023)   Received from [facility]   Exercise Vital Sign  On average, how many days per week do you engage in moderate to strenuous exercise (like a brisk walk)?: 0 days    On average, how many minutes do you engage in exercise at this level?: 0 min  Stress: No Stress Concern Present (11/05/2023)   Received from [facility] of Occupational Health - Occupational Stress Questionnaire    Feeling of Stress : Not at all  Social Connections: Socially Integrated (02/03/2024)   Social Connection and Isolation Panel    Frequency of Communication with Friends and Family: More than three times a week    Frequency of Social Gatherings with Friends and Family: More than three times a week    Attends Religious Services: More than 4 times per year    Active Member of Clubs or Organizations: Yes    Attends Banker Meetings: More than 4 times per year    Marital Status: Married  Catering Manager Violence: Not At Risk (02/03/2024)   Epic    Fear of Current or Ex-Partner: No    Emotionally Abused: No    Physically Abused: No    Sexually Abused: No  Depression (PHQ2-9): Low Risk (10/23/2021)   Depression (PHQ2-9)    PHQ-2 Score: 0  Alcohol Screen: Low Risk (07/03/2021)   Alcohol Screen    Last Alcohol Screening Score (AUDIT): 0  Housing: Low Risk (02/03/2024)   Epic    Unable to Pay for Housing in the Last Year: No    Number of Times Moved in the Last Year: 0    Homeless in the Last Year: No  Utilities: Not At Risk (02/03/2024)   Epic    Threatened with loss of utilities: No  Health Literacy: Adequate Health Literacy (11/05/2023)   Received from [facility]   [phone] Health Literacy    How often do you need to have someone help you when you read instructions, pamphlets, or other written material from your doctor or pharmacy?: Never     Review of Systems    General:  No chills, fever, night sweats or weight changes.  Cardiovascular:  No chest pain, dyspnea on exertion, edema, orthopnea, palpitations, paroxysmal nocturnal dyspnea. Dermatological: No rash, lesions/masses Respiratory: No cough, dyspnea Urologic: No hematuria, dysuria Abdominal:   No nausea, vomiting, diarrhea, bright red blood per rectum, melena, or hematemesis Neurologic:  No visual changes, wkns, changes in mental status. All other systems reviewed and are otherwise negative except as noted above.  Physical Exam    Blood pressure (!) 109/46, pulse 65, temperature 98.6 F (37 C), resp. rate 18, height 5' 6 (1.676 m), weight 93.8 kg, SpO2 94%.  General: Pleasant, NAD Psych: Normal affect. Neuro: Alert and oriented X 3. Moves all extremities spontaneously. HEENT: Normal  Neck: Supple without bruits or JVD. Lungs:  Resp regular and unlabored, CTA. Heart: RRR, 2/6 SEM throughout, loudest @ the upper sternal borders.  2/6 diastolic murmur @ the LLSB.  No s3, s4. Abdomen: Soft, non-tender, non-distended, BS + x 4.  Extremities: No clubbing, cyanosis or edema. DP/PT 2+, Radials 2+ and equal bilaterally.  Labs    Recent Labs  Lab 01/19/24 1019 01/19/24 1234 01/19/24 1649 01/19/24 1842 02/01/24 1042  TRNPT 99* 117* 101* 108* 30*  ] Lab Results  Component Value Date   WBC 5.9 02/06/2024   HGB 13.1 02/06/2024   HCT 38.9 (L) 02/06/2024   MCV 94.4 02/06/2024   PLT 129 (L) 02/06/2024  Lab Results  Component Value Date   NA 141 02/06/2024   CL 98 02/06/2024   K 3.8 02/06/2024   CO2 36 (H) 02/06/2024   BUN 22 02/06/2024   CREATININE 1.20 02/06/2024   GFRNONAA >60 02/06/2024   CALCIUM  9.4 02/06/2024   PHOS 3.1 07/12/2021   ALBUMIN 3.7  02/02/2024   GLUCOSE 147 (H) 02/06/2024    Lab Results  Component Value Date   CHOL 133 10/07/2021   HDL 43 10/07/2021   LDLCALC 67 10/07/2021   TRIG 133 10/07/2021   ProBNP    Component Value Date/Time   PROBNP 2,213.0 (H) 02/01/2024 1042      Radiology Studies    DG Chest 2 View Result Date: 02/01/2024 EXAM: 2 VIEW(S) XRAY OF THE CHEST 02/01/2024 09:25:00 AM COMPARISON: 01/19/2024 CLINICAL HISTORY: 83 year old male with persistent shortness of breath for 1 month. FINDINGS: LUNGS AND PLEURA: Vascular congestion/mild interstitial pulmonary edema. Small to moderate bilateral pleural effusions. Bibasilar airspace opacities. No pneumothorax. No significant change since 01/19/2024. HEART AND MEDIASTINUM: Cardiomegaly. Prosthetic aortic valve. BONES AND SOFT TISSUES: Prior median sternotomy. No acute osseous abnormality. IMPRESSION: 1. Pulmonary vascular congestion with bilateral pleural effusions not significantly changed since December/11/2023 2. No new cardiopulmonary abnormality. Electronically signed by: Helayne Hurst MD 02/01/2024 10:44 AM EST RP Workstation: [id number]   US  Venous Img Lower Bilateral (DVT) Result Date: 01/20/2024 CLINICAL DATA:  83 year old male with lower extremity pain. EXAM: BILATERAL LOWER EXTREMITY VENOUS DOPPLER ULTRASOUND  IMPRESSION: No evidence of bilateral lower extremity deep venous thrombosis. Ester Sides, MD [facility] Electronically Signed   By: Ester Sides M.D.   On: 01/20/2024 13:01   NM Myocar Multi W/Spect W/Wall Motion / EF Result Date: 01/05/2024   No ST deviation was noted. Pharmacological myocardial perfusion imaging study with no significant  ischemia Small region fixed defect distal inferolateral wall, unable to exclude prior MI No focal wall motion abnormality, EF estimated at 30% (poor estimation in the setting of GI and attenuation artifact) No EKG changes concerning for ischemia at  peak stress or in recovery. Low risk scan Recent echo with low normal ejection fraction 50 to 55% Signed, Velinda Lunger, MD, Ph.D [facility]    ECG & Cardiac Imaging    02/01/2024 @ 09:07 - RSR, 76, Rightward axis, IVCD, poor R progression, inferolateral ST/T abnormalities - personally reviewed.  Assessment & Plan    1. Acute hypoxemic respiratory failure/acute on chronic HFpEF/Bilateral Pleural effusions: Patient admitted for the second time in the month of December due to progressive dyspnea on exertion and volume overload in the setting of progressive bioprosthetic aortic valve stenosis. Saturating in the 80s at office visit on December 23. proBNP on admission 2213 and chest x-ray showing pulmonary vascular congestion and bilateral pleural effusions (small to mod - unchanged from earlier this month). Echo this admission with an EF of 50 to 55% and grade 2 diastolic dysfunction, with presence of severe aortic stenosis and mean aortic valve gradient of 54 mmHg.  Right and left heart cath showed persistently elevated filling pressures [wedge of 22, PA 55/17 (30)], and 3 of 3 patent grafts with native multivessel disease.  No targets for intervention.  Responded well to intravenous Lasix  + metolazone  2.5 mg yesterday and was minus 1.2 L.  Wt down 1.3 kg since yesterday.  Renal function relatively stable to creatinine slightly higher at 1.20.  He continues to have significant lower extremity edema and coarse breath sounds.  Will give  additional 2.5 mg metolazone  today as he seems to respond better to this than to furosemide  by itself.  2.  Severe bioprosthetic aortic valve stenosis:  s/p TAVR w/ 23mm Sapien valve in [place], [place] in 01/2017.  Noted in outpt setting to have progressive AoV stenosis complicated by progressive dyspnea, volume overload, and now his second admission this month due to heart failure.  Echo this admission w/ mean AoV gradient of 54 mmHg consistent with severe stenosis and moderate  perivalvular leak.  Volume overloaded as outlined above.  Continue diuresis.  Structural heart team to see tomorrow with plan for CT to rule out valve thrombosis.  Continue heparin .  3.  CAD/demand ischemia: History of CABG x 3 in 2009 with low risk Myoview  November 2025.  Diagnostic catheterization this admission with 3 of 3 patent grafts and severe native disease without targets for intervention.  Continue aspirin , beta-blocker, and statin therapy.  4.  Primary hypertension: Pressure stable on beta-blocker and diuretic therapy.  He is off of prior home dose of amlodipine , which may have been contributing some to his lower extremity edema.  5.  Hyperlipidemia: Continue rosuvastatin  40 mg-increase this admission in the setting of an LDL of 76 earlier this year.  No complaints of myalgias, which he experienced previously on atorvastatin.  6.  Hypokalemia: Stable at 3.8 on potassium supplementation.  Risk Assessment/Risk Scores:        New York  Heart Association (NYHA) Functional Class NYHA Class IV    Signed, Lonni Meager, NP 02/06/2024, 10:31 AM     "

## 2024-02-07 ENCOUNTER — Encounter (HOSPITAL_COMMUNITY): Payer: Self-pay | Admitting: Internal Medicine

## 2024-02-07 ENCOUNTER — Inpatient Hospital Stay (HOSPITAL_COMMUNITY)

## 2024-02-07 DIAGNOSIS — I5033 Acute on chronic diastolic (congestive) heart failure: Secondary | ICD-10-CM

## 2024-02-07 DIAGNOSIS — I350 Nonrheumatic aortic (valve) stenosis: Secondary | ICD-10-CM | POA: Diagnosis not present

## 2024-02-07 LAB — BASIC METABOLIC PANEL WITH GFR
Anion gap: 9 (ref 5–15)
BUN: 21 mg/dL (ref 8–23)
CO2: 35 mmol/L — ABNORMAL HIGH (ref 22–32)
Calcium: 9.9 mg/dL (ref 8.9–10.3)
Chloride: 97 mmol/L — ABNORMAL LOW (ref 98–111)
Creatinine, Ser: 1.32 mg/dL — ABNORMAL HIGH (ref 0.61–1.24)
GFR, Estimated: 54 mL/min — ABNORMAL LOW
Glucose, Bld: 137 mg/dL — ABNORMAL HIGH (ref 70–99)
Potassium: 4.1 mmol/L (ref 3.5–5.1)
Sodium: 141 mmol/L (ref 135–145)

## 2024-02-07 LAB — CBC
HCT: 38.5 % — ABNORMAL LOW (ref 39.0–52.0)
Hemoglobin: 13.1 g/dL (ref 13.0–17.0)
MCH: 32.5 pg (ref 26.0–34.0)
MCHC: 34 g/dL (ref 30.0–36.0)
MCV: 95.5 fL (ref 80.0–100.0)
Platelets: 136 K/uL — ABNORMAL LOW (ref 150–400)
RBC: 4.03 MIL/uL — ABNORMAL LOW (ref 4.22–5.81)
RDW: 14.6 % (ref 11.5–15.5)
WBC: 7.4 K/uL (ref 4.0–10.5)
nRBC: 0 % (ref 0.0–0.2)

## 2024-02-07 LAB — HEPARIN LEVEL (UNFRACTIONATED): Heparin Unfractionated: 0.35 [IU]/mL (ref 0.30–0.70)

## 2024-02-07 LAB — LIPOPROTEIN A (LPA): Lipoprotein (a): 174.5 nmol/L — ABNORMAL HIGH

## 2024-02-07 MED ORDER — POTASSIUM CHLORIDE CRYS ER 20 MEQ PO TBCR
40.0000 meq | EXTENDED_RELEASE_TABLET | Freq: Every day | ORAL | Status: DC
  Administered 2024-02-07 – 2024-02-11 (×5): 40 meq via ORAL
  Filled 2024-02-07 (×5): qty 2

## 2024-02-07 MED ORDER — IOHEXOL 350 MG/ML SOLN
100.0000 mL | Freq: Once | INTRAVENOUS | Status: AC | PRN
  Administered 2024-02-07: 100 mL via INTRAVENOUS

## 2024-02-07 NOTE — Plan of Care (Signed)

## 2024-02-07 NOTE — Progress Notes (Addendum)
 "  Progress Note  Patient Name: Jacob Montes Date of Encounter: 02/07/2024 [facility] Cardiologist: Evalene Lunger, MD   Interval Summary    Breathing much better today, LE edema has improved per his report.   Vital Signs Vitals:   02/06/24 2240 02/06/24 2241 02/07/24 0507 02/07/24 0821  BP: 117/60 117/60 (!) 104/49 (!) 119/48  Pulse: 80 80 75 70  Resp:  18 18 20   Temp:  98 F (36.7 C) 98.6 F (37 C) 98.4 F (36.9 C)  TempSrc:  Oral Oral Oral  SpO2:  95% 96% 94%  Weight:   92.1 kg   Height:        Intake/Output Summary (Last 24 hours) at 02/07/2024 0834 Last data filed at 02/07/2024 0225 Gross per 24 hour  Intake 499.32 ml  Output 1725 ml  Net -1225.68 ml      02/07/2024    5:07 AM 02/06/2024    5:43 PM 02/06/2024    5:00 AM  Last 3 Weights  Weight (lbs) 203 lb 206 lb 12.7 oz 206 lb 12.7 oz  Weight (kg) 92.08 kg 93.8 kg 93.8 kg      Telemetry/ECG   Sinus Rhythm - Personally Reviewed  Physical Exam  GEN: No acute distress.   Neck: No JVD Cardiac: RRR, 2/6 systolic murmur RUSB, no rubs, or gallops.  Respiratory: Mild crackles in bases  GI: Soft, nontender, non-distended  MS: No edema  Assessment & Plan   83 year old male with a history of CAD status post CABG x 3 in June 2009, severe aortic stenosis status post TAVR in December 2018, hypertension, hyperlipidemia, statin intolerance, stroke, carotid stenosis status post left carotid endarterectomy in 2009 complicated by right laryngeal nerve paralysis, HFpEF, nephrolithiasis, remote tobacco abuse, sleep apnea, and remote PE '12 who presented with progressive heart failure.   Acute hypoxemic respiratory failure Acute on chronic HFpEF -- Admitted for the second time in December due to progressive dyspnea on exertion and volume overload in the setting of progressive bioprosthetic aortic valve stenosis -- Echo 12/23 with LVEF of 50 to 55%, grade 2 diastolic dysfunction, severe aortic stenosis with  mean aortic valve gradient of 54 mmHg -- [facility] 12/26 wedge of 22, PA 55/17 (30), with 3 of 3 patent grafts no targets for intervention. -- s/p IV lasix  and metolazone  2.5 mg. Net - 1.2L  Severe bioprosthetic aortic valve stenosis S/p TAVR 23mm Sapien valve in [place]  01/2017 -- Noted in the outpatient setting to have progressive aortic valve stenosis complicated by progressive dyspnea, volume overload and recurrent admission -- Echo 12/23 with mean aortic valve gradient of 54 mmHg and moderate perivalvular leak -- Plan for TAVR CTs today (rule out thrombus), structural team evaluation   Mild AKI -- Cr with slight increase to 1.2>>1.32, will hold additional IV lasix  and metolazone  with plans for CTs  CAD s/p three-vessel CABG '09 -- Left heart cath 12/26 with 3 out of 3 patent grafts, no targets for intervention to native vessel disease -- continue aspirin , beta-blocker and statin  HTN -- stable -- continue Toprol  25mg  daily   HLD -- continue crestor  40mg  daily   For questions or updates, please contact [facility] Please consult [email or web address] for contact info under   Signed, Manuelita Rummer, NP    ATTENDING ATTESTATION:  After conducting a review of all available clinical information with the care team, interviewing the patient, and performing a physical exam, I agree with the findings and plan described in  this note.   GEN: No acute distress, AO x 3 HEENT:  MMM, no JVD, no scleral icterus Cardiac: RRR, 2/6 SEM Respiratory: Clear to auscultation bilaterally. GI: Soft, nontender, non-distended  MS: +1 edema; No deformity. Neuro:  Nonfocal  Vasc:  +2 radial pulses  Patient is a very pleasant 83 year old male with a history of coronary artery disease status post CABG in 2009 with recent coronary and bypass angiography demonstrating patent LIMA to LAD, vein graft to OM1, and vein graft to OM 2, aortic stenosis status post 23 mm SAPIEN 11 January 2017,  hypertension, and hyperlipidemia who has been admitted twice to [facility] hospital with acute on chronic diastolic heart failure.  His most recent echocardiogram that I reviewed demonstrated severe bioprosthetic TAVR valve degeneration with a mean gradient of over 50 mmHg.  He is currently on anticoagulation for possible leaflet thrombosis.    Currently patient is doing well.  He denies any significant shortness of breath.  He still has some mild orthopnea and mild peripheral edema but these are much improved.  Will obtain a TAVR protocol CTA.  If this shows leaflet thrombosis we will consider continuing anticoagulation.  Given the fact that his grafts are all patent this makes TAVR in TAVR likely feasible.  Will resume diuretics based on creatinine tomorrow.  Will plan on patient being discharged with cardiothoracic surgery follow-up as an outpatient.  He has no dental issues.  Discussed plan at length with patient and wife who understand and agree.  Demia Viera, MD Pager [phone]  "

## 2024-02-07 NOTE — Progress Notes (Signed)
 PHARMACY - ANTICOAGULATION CONSULT NOTE  Pharmacy Consult for heparin  infusion Indication: VTE, concern for AoV thrombosis  Allergies[1]  Patient Measurements: Height: 5' 6 (167.6 cm) Weight: 92.1 kg (203 lb) IBW/kg (Calculated) : 63.8 HEPARIN  DW (KG): 84  Vital Signs: Temp: 98.6 F (37 C) (12/29 0507) Temp Source: Oral (12/29 0507) BP: 104/49 (12/29 0507) Pulse Rate: 75 (12/29 0507)  Labs: Recent Labs    02/05/24 0401 02/06/24 0354 02/07/24 0453  HGB 11.4* 13.1 13.1  HCT 33.9* 38.9* 38.5*  PLT 116* 129* 136*  HEPARINUNFRC 0.48 0.55 0.35  CREATININE 1.06 1.20 1.32*    Estimated Creatinine Clearance: 45 mL/min (A) (by C-G formula based on SCr of 1.32 mg/dL (H)).   Medical History: Past Medical History:  Diagnosis Date   Actinic keratosis    Acute exacerbation of CHF (congestive heart failure) (HCC) 01/19/2024   Aortic stenosis s/p TAVR    a. 01/2017 s/p TAVR - 23mm Sapien; b. 11/2023 Echo: EF 50-55%, GrII DD, nl RV fxn, mild-mod MR, mild Ca2+ MS, Mod AS, AVA 0.84, mean gradient 32 mmHg, mild perivalvular leak; c. 01/2024 Echo: EF 50-55%, no rwma, GrII DD, nl RV fxn, mod dil LA, mildly dil RA, mild MR/MS, sev AS w/ mod perivalvular leak - mean grad , Vmax 4.52 m/s, DVI 0.13.   CAD (coronary artery disease)    a. 07/2007 s/p CABG x 3 [facility], ); b. 12/2023 MV: No isch/infarct; c. 01/2024 Cath: LM 95ost/m, LAD 141m, LCX 100ost/p/m, OM3 40, RCA small, 95p, 79m, RPDA fills via collats from LAD. VG->OM1 nl, VG->OM3 nl, LIMA->LAD nl.   Cancer (HCC)    Skin   Carotid artery disease    a. 2009 s/p left sided CEA complicated by RLN parlaysis; b. 02/2023 U/S: no significant dzs.   Cataract    Chronic heart failure with preserved ejection fraction (HFpEF) (HCC)    a. 01/2024 Echo: EF 50-55%, GrII DD, nl RV fxn, mod dil LA, mild MR/MS, severe AS, mod perivalvular leak; b. 01/2024 RHC: RA 3, RV 66/2, PA 55/17 (30), PCWP 20.   DVT (deep venous thrombosis) (HCC) 08/2010    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   GERD (gastroesophageal reflux disease)    HLD (hyperlipidemia)    Hyperlipidemia 10/15/2015   Hypertension    Nephrolithiasis 11/16/2018   Pulmonary embolism (HCC) 08/2010   Skin cancer    Sleep apnea    Stroke (HCC)     Medications:  Scheduled:   aspirin  EC  81 mg Oral Daily   budesonide  (PULMICORT ) nebulizer solution  0.25 mg Nebulization BID   furosemide   40 mg Intravenous BID   ipratropium-albuterol   3 mL Nebulization BID   metolazone   2.5 mg Oral Daily   metoprolol  succinate  25 mg Oral Daily   multivitamin with minerals  1 tablet Oral Daily   pantoprazole   40 mg Oral Q0600   potassium chloride   40 mEq Oral BID   rosuvastatin   40 mg Oral Daily   Infusions:   heparin  1,100 Units/hr (02/06/24 1845)    PRN: acetaminophen , montelukast , nitroGLYCERIN , ondansetron  (ZOFRAN ) IV, polyethylene glycol  Assessment: 83 y.o. 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 Now s/p [facility] cardiology wishes to start IV heparin  ISO suspected TAVR thrombosis.  02/07/24: Heparin  level 0.35, remains  therapeutic on heparin  1100 units/hr. No issues with infusion running or signs of bleeding per RN. CBC stable (Hgb 13.1, PLT 136).   Goal of Therapy:  anti-Xa level 0.3-0.7 units/ml Monitor platelets by anticoagulation protocol: Yes   Plan:   Continue heparin  infusion at 1100 units/hr at [facility] Recheck HL daily w/ AM labs while therapeutic Continue to monitor H&H and platelets  Morna Breach, PharmD, BCPS PGY2 Cardiology Pharmacy Resident 02/07/2024 7:30 AM     [1]  Allergies Allergen Reactions   Atorvastatin Other (See Comments)    aches  myalgia  myalgia    aches  myalgia   Ramipril  Other (See Comments) and Cough    cough

## 2024-02-07 NOTE — Plan of Care (Signed)
" °  Problem: Education: Goal: Knowledge of General Education information will improve Description: Including pain rating scale, medication(s)/side effects and non-pharmacologic comfort measures Outcome: Progressing   Problem: Health Behavior/Discharge Planning: Goal: Ability to manage health-related needs will improve Outcome: Progressing   Problem: Clinical Measurements: Goal: Ability to maintain clinical measurements within normal limits will improve Outcome: Progressing   Problem: Clinical Measurements: Goal: Will remain free from infection Outcome: Progressing   Problem: Clinical Measurements: Goal: Diagnostic test results will improve Outcome: Progressing   Problem: Activity: Goal: Risk for activity intolerance will decrease Outcome: Progressing   Problem: Skin Integrity: Goal: Risk for impaired skin integrity will decrease Outcome: Progressing   Problem: Activity: Goal: Ability to tolerate increased activity will improve Outcome: Progressing   Problem: Cardiac: Goal: Ability to achieve and maintain adequate cardiovascular perfusion will improve Outcome: Progressing   Problem: Health Behavior/Discharge Planning: Goal: Ability to safely manage health-related needs after discharge will improve Outcome: Progressing   "

## 2024-02-08 ENCOUNTER — Other Ambulatory Visit (HOSPITAL_COMMUNITY): Payer: Self-pay

## 2024-02-08 DIAGNOSIS — I350 Nonrheumatic aortic (valve) stenosis: Secondary | ICD-10-CM | POA: Diagnosis not present

## 2024-02-08 LAB — BASIC METABOLIC PANEL WITH GFR
Anion gap: 8 (ref 5–15)
BUN: 22 mg/dL (ref 8–23)
CO2: 35 mmol/L — ABNORMAL HIGH (ref 22–32)
Calcium: 9.5 mg/dL (ref 8.9–10.3)
Chloride: 97 mmol/L — ABNORMAL LOW (ref 98–111)
Creatinine, Ser: 1.34 mg/dL — ABNORMAL HIGH (ref 0.61–1.24)
GFR, Estimated: 53 mL/min — ABNORMAL LOW
Glucose, Bld: 123 mg/dL — ABNORMAL HIGH (ref 70–99)
Potassium: 3.5 mmol/L (ref 3.5–5.1)
Sodium: 140 mmol/L (ref 135–145)

## 2024-02-08 LAB — CBC
HCT: 35.5 % — ABNORMAL LOW (ref 39.0–52.0)
Hemoglobin: 12.1 g/dL — ABNORMAL LOW (ref 13.0–17.0)
MCH: 32.5 pg (ref 26.0–34.0)
MCHC: 34.1 g/dL (ref 30.0–36.0)
MCV: 95.4 fL (ref 80.0–100.0)
Platelets: 121 K/uL — ABNORMAL LOW (ref 150–400)
RBC: 3.72 MIL/uL — ABNORMAL LOW (ref 4.22–5.81)
RDW: 14.6 % (ref 11.5–15.5)
WBC: 6.5 K/uL (ref 4.0–10.5)
nRBC: 0 % (ref 0.0–0.2)

## 2024-02-08 LAB — HEPARIN LEVEL (UNFRACTIONATED): Heparin Unfractionated: 0.29 [IU]/mL — ABNORMAL LOW (ref 0.30–0.70)

## 2024-02-08 NOTE — Plan of Care (Signed)
   Problem: Education: Goal: Knowledge of General Education information will improve Description Including pain rating scale, medication(s)/side effects and non-pharmacologic comfort measures Outcome: Progressing

## 2024-02-08 NOTE — TOC Initial Note (Signed)
 Transition of Care [facility]) - Initial/Assessment Note    Patient Details  Name: Jacob Montes MRN: [id number] Date of Birth: Sep 09, 1940  [facility]) CM/SW Contact:    Sudie Erminio Deems, RN Phone Number: 02/08/2024, 12:41 PM  Clinical Narrative: Patient presented for hypoxia and progressive heart failure. PTA patient was from home with spouse. Patient has PCP Charlie Forte. Spouse was at the bedside during the visit and patient has oxygen via [employer] 2 Liters baseline. Patient has a 5 liter concentrator in the home. Patent has no history of HH needs. ICM will continue to follow for additional needs as the patient progresses.                   Expected Discharge Plan: Home/Self Care Barriers to Discharge: Continued Medical Work up   Patient Goals and CMS Choice Patient states their goals for this hospitalization and ongoing recovery are:: Plan to return home with spouse.          Expected Discharge Plan and Services   Discharge Planning Services: CM Consult Post Acute Care Choice: NA Living arrangements for the past 2 months: Single Family Home                   DME Agency: NA       HH Arranged: NA          Prior Living Arrangements/Services Living arrangements for the past 2 months: Single Family Home   Patient language and need for interpreter reviewed:: Yes Do you feel safe going back to the place where you live?: Yes      Need for Family Participation in Patient Care: No (Comment) Care giver support system in place?: No (comment) Current home services: DME (oxygen 2 liters baseline via [employer].) Criminal Activity/Legal Involvement Pertinent to Current Situation/Hospitalization: No - Comment as needed  Activities of Daily Living   ADL Screening (condition at time of admission) Independently performs ADLs?: Yes (appropriate for developmental age) Is the patient deaf or have difficulty hearing?: No Does the patient have difficulty seeing, even  when wearing glasses/contacts?: No Does the patient have difficulty concentrating, remembering, or making decisions?: No  Permission Sought/Granted Permission sought to share information with : Family Supports, Case Manager                Emotional Assessment Appearance:: Appears stated age Attitude/Demeanor/Rapport: Engaged Affect (typically observed): Appropriate Orientation: : Oriented to Self, Oriented to Place, Oriented to  Time Alcohol / Substance Use: Not Applicable Psych Involvement: No (comment)  Admission diagnosis:  Severe aortic stenosis [I35.0] Patient Active Problem List   Diagnosis Date Noted   Severe aortic stenosis 02/06/2024   Acute on chronic heart failure with preserved ejection fraction (HFpEF) (HCC) 02/05/2024   Aortic valve disorder 02/04/2024   CHF (congestive heart failure) (HCC) 02/01/2024   Acute exacerbation of CHF (congestive heart failure) (HCC) 01/19/2024   Fever 07/18/2021   Liver abscess 07/15/2021   Choledocholithiasis 07/10/2021   Elevated liver enzymes 07/09/2021   Acute cholangitis with choledocholithiasis 07/08/2021   Atypical chest pain 04/22/2021   History of CAD 04/22/2021   Paroxysmal tachycardia (HCC) 04/22/2021   History of transcatheter aortic valve replacement (TAVR) 04/22/2021   Morbid obesity (HCC) 04/22/2021   CKD (chronic kidney disease) 12/03/2020   Bacteremia    Common bile duct stone    Ascending cholangitis (HCC) 11/17/2018   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 11/16/2018   Dysmetabolic syndrome 11/16/2018   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  Hyperglycemia 11/16/2018   Tear of meniscus of left knee 05/24/2018   Constipation 10/07/2016   Aortic stenosis 10/15/2015   Benign essential hypertension 10/15/2015   Carotid stenosis, bilateral 10/15/2015   Hypogonadism, 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 [place] [address] Phone: [phone] Fax: [phone]  [facility] - [place], [place] - [address] [place] [address] Phone: [phone] Fax: [phone]     Social Drivers of Health (SDOH) Social History: SDOH Screenings   Food Insecurity: No Food Insecurity (02/06/2024)  Housing: Low Risk (02/06/2024)  Transportation Needs: No Transportation Needs (02/06/2024)  Utilities: Not At Risk (02/06/2024)  Alcohol Screen: Low Risk (07/03/2021)  Depression (PHQ2-9): Low Risk (10/23/2021)  Financial Resource Strain: Low Risk  (12/02/2023)   Received from [facility]  Physical Activity: Inactive (12/02/2023)   Received from [facility]  Social Connections: Socially Integrated (02/06/2024)  Stress: No Stress Concern Present (11/05/2023)   Received from [facility]  Tobacco Use: Medium Risk (02/07/2024)  Health Literacy: Adequate Health Literacy (11/05/2023)   Received from [facility]   SDOH Interventions:     Readmission Risk Interventions     No data to display

## 2024-02-08 NOTE — Progress Notes (Addendum)
 "  Progress Note  Patient Name: Jacob Montes Date of Encounter: 02/08/2024 [facility] Cardiologist: Jacob Lunger, MD   Interval Summary    Sitting up in the chair, Gas@1L  this morning.   Vital Signs Vitals:   02/08/24 0807 02/08/24 0841 02/08/24 0843 02/08/24 0850  BP:    (!) 106/48  Pulse: 74  74   Resp: 16 16  17   Temp:    99 F (37.2 C)  TempSrc:  Oral  Oral  SpO2: 95%     Weight:      Height:        Intake/Output Summary (Last 24 hours) at 02/08/2024 0907 Last data filed at 02/08/2024 9096 Gross per 24 hour  Intake 240 ml  Output 2015 ml  Net -1775 ml      02/08/2024    5:00 AM 02/07/2024    5:07 AM 02/06/2024    5:43 PM  Last 3 Weights  Weight (lbs) 203 lb 9.6 oz 203 lb 206 lb 12.7 oz  Weight (kg) 92.352 kg 92.08 kg 93.8 kg      Telemetry/ECG   Sinus Rhythm, PVCs - Personally Reviewed  Physical Exam  GEN: No acute distress.   Neck: No JVD Cardiac: RRR, 2/6 systolic murmur RUSB, no rubs, or gallops.  Respiratory: Clear to auscultation bilaterally. GI: Soft, nontender, non-distended  MS: 1+ LE edema with TED hose in place   Assessment & Plan   83 year old male with a history of CAD status post CABG x 3 in June 2009, severe aortic stenosis status post TAVR in December 2018, hypertension, hyperlipidemia, statin intolerance, stroke, carotid stenosis status post left carotid endarterectomy in 2009 complicated by right laryngeal nerve paralysis, HFpEF, nephrolithiasis, remote tobacco abuse, sleep apnea, and remote PE '12 who presented with progressive heart failure.    Acute hypoxemic respiratory failure Acute on chronic HFpEF -- Admitted for the second time in December due to progressive dyspnea on exertion and volume overload in the setting of progressive bioprosthetic aortic valve stenosis -- Echo 12/23 with LVEF of 50 to 55%, grade 2 diastolic dysfunction, severe aortic stenosis with mean aortic valve gradient of 54 mmHg -- [facility] 12/26 wedge  of 22, PA 55/17 (30), with 3 of 3 patent grafts no targets for intervention. -- s/p IV lasix  and metolazone  2.5 mg. Hold additional diuretics. Net - 3L, weight down 3lbs overnight   Severe bioprosthetic aortic valve stenosis S/p TAVR 23mm Sapien valve in [place]   01/2017 -- Noted in the outpatient setting to have progressive aortic valve stenosis complicated by progressive dyspnea, volume overload and recurrent admission -- Echo 12/23 with mean aortic valve gradient of 54 mmHg and moderate perivalvular leak -- TAVR CTs done yesterday. No thrombosis noted. Will stop IV heparin . Will discuss with MD, but plan for potential TAVR in TAVR later this week    Mild AKI -- Cr with slight increase to 1.2>>1.32>>1.34, will hold additional IV lasix  and metolazone  with plans for CTs   CAD s/p three-vessel CABG '09 -- Left heart cath 12/26 with 3 out of 3 patent grafts, no targets for intervention to native vessel disease -- continue aspirin , beta-blocker and statin   HTN -- stable -- continue Toprol  25mg  daily    HLD -- continue crestor  40mg  daily   For questions or updates, please contact [facility] Please consult [email or web address] for contact info under   Signed, Jacob Rummer, NP     ATTENDING ATTESTATION:  After conducting a review of all  available clinical information with the care team, interviewing the patient, and performing a physical exam, I agree with the findings and plan described in this note.   GEN: No acute distress, AO x 3 HEENT:  MMM, no JVD, no scleral icterus Cardiac: 2/6 SEM, no murmurs, rubs, or gallops.  Respiratory: Clear to auscultation bilaterally. GI: Soft, nontender, non-distended  MS: No edema; No deformity. Neuro:  Nonfocal  Vasc:  +2 radial pulses  Patient remained stable today.  He he did desaturate somewhat requiring a little bit more supplemental oxygen.  His creatinine is relatively stable at 1.34.  TAVR CTA was done yesterday.   We are awaiting vendor review.  Notably he has not on grafted nondominant right coronary artery.  Will need to make sure that coronary perfusion will not be jeopardized with a TAV in TAV configuration.  For now continue current therapy.  Agree with holding lasix  given elevated Cr.  Jacob Commisso, MD Pager [phone]   "

## 2024-02-08 NOTE — Progress Notes (Addendum)
 PHARMACY - ANTICOAGULATION CONSULT NOTE  Pharmacy Consult for heparin  infusion Indication: VTE, concern for AoV thrombosis  Allergies[1]  Patient Measurements: Height: 5' 6 (167.6 cm) Weight: 92.4 kg (203 lb 9.6 oz) IBW/kg (Calculated) : 63.8 HEPARIN  DW (KG): 84  Vital Signs: Temp: 98.6 F (37 C) (12/30 0421) Temp Source: Oral (12/30 0421) BP: 109/45 (12/30 0421) Pulse Rate: 76 (12/30 0421)  Labs: Recent Labs    02/06/24 0354 02/07/24 0453 02/08/24 0546  HGB 13.1 13.1 12.1*  HCT 38.9* 38.5* 35.5*  PLT 129* 136* 121*  HEPARINUNFRC 0.55 0.35 0.29*  CREATININE 1.20 1.32* 1.34*    Estimated Creatinine Clearance: 44.4 mL/min (A) (by C-G formula based on SCr of 1.34 mg/dL (H)).   Medical History: Past Medical History:  Diagnosis Date   Actinic keratosis    Acute exacerbation of CHF (congestive heart failure) (HCC) 01/19/2024   Aortic stenosis s/p TAVR    a. 01/2017 s/p TAVR - 23mm Sapien; b. 11/2023 Echo: EF 50-55%, GrII DD, nl RV fxn, mild-mod MR, mild Ca2+ MS, Mod AS, AVA 0.84, mean gradient 32 mmHg, mild perivalvular leak; c. 01/2024 Echo: EF 50-55%, no rwma, GrII DD, nl RV fxn, mod dil LA, mildly dil RA, mild MR/MS, sev AS w/ mod perivalvular leak - mean grad , Vmax 4.52 m/s, DVI 0.13.   CAD (coronary artery disease)    a. 07/2007 s/p CABG x 3 [facility] [place]); b. 12/2023 MV: No isch/infarct; c. 01/2024 Cath: LM 95ost/m, LAD 176m, LCX 100ost/p/m, OM3 40, RCA small, 95p, 60m, RPDA fills via collats from LAD. VG->OM1 nl, VG->OM3 nl, LIMA->LAD nl.   Cancer (HCC)    Skin   Carotid artery disease    a. 2009 s/p left sided CEA complicated by RLN parlaysis; b. 02/2023 U/S: no significant dzs.   Cataract    Chronic heart failure with preserved ejection fraction (HFpEF) (HCC)    a. 01/2024 Echo: EF 50-55%, GrII DD, nl RV fxn, mod dil LA, mild MR/MS, severe AS, mod perivalvular leak; b. 01/2024 RHC: RA 3, RV 66/2, PA 55/17 (30), PCWP 20.   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   GERD (gastroesophageal reflux disease)    HLD (hyperlipidemia)    Hyperlipidemia 10/15/2015   Hypertension    Nephrolithiasis 11/16/2018   Pulmonary embolism (HCC) 08/2010   Skin cancer    Sleep apnea    Stroke (HCC)     Medications:  Scheduled:   aspirin  EC  81 mg Oral Daily   budesonide  (PULMICORT ) nebulizer solution  0.25 mg Nebulization BID   ipratropium-albuterol   3 mL Nebulization BID   metoprolol  succinate  25 mg Oral Daily   multivitamin with minerals  1 tablet Oral Daily   pantoprazole   40 mg Oral Q0600   potassium chloride   40 mEq Oral Daily   rosuvastatin   40 mg Oral Daily   Infusions:   heparin  1,100 Units/hr (02/07/24 1235)    PRN: acetaminophen , montelukast , nitroGLYCERIN , ondansetron  (ZOFRAN ) IV, polyethylene glycol  Assessment: 83 y.o. 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 Now s/p [facility] cardiology wishes to start IV heparin  ISO suspected TAVR thrombosis.  02/08/24: Heparin  level 0.29, slightly subtherapeutic on heparin  1100 units/hr. No issues with infusion running or signs of bleeding  per RN. CBC stable (Hgb 12.1, PLT 121).   Goal of Therapy:  anti-Xa level 0.3-0.7 units/ml Monitor platelets by anticoagulation protocol: Yes   Plan:   Increase heparin  infusion to 1200 units/hr Recheck HL daily w/ AM labs while therapeutic Continue to monitor H&H and platelets  ADDENDUM 0930: Discussed with cardiology team, no notes of thrombus on the CT scans. Plan to discontinue heparin  at this time. Patient will continue on aspirin  81 mg daily.   Morna Breach, PharmD, BCPS PGY2 Cardiology Pharmacy Resident 02/08/2024 7:19 AM    [1]  Allergies Allergen  Reactions   Atorvastatin Other (See Comments)    aches  myalgia  myalgia    aches  myalgia   Ramipril Other (See Comments) and Cough    cough

## 2024-02-09 ENCOUNTER — Ambulatory Visit: Admission: RE | Admit: 2024-02-09 | Source: Home / Self Care | Admitting: Cardiovascular Disease

## 2024-02-09 ENCOUNTER — Encounter: Admission: RE | Payer: Self-pay | Source: Home / Self Care

## 2024-02-09 DIAGNOSIS — I350 Nonrheumatic aortic (valve) stenosis: Secondary | ICD-10-CM

## 2024-02-09 DIAGNOSIS — I5023 Acute on chronic systolic (congestive) heart failure: Secondary | ICD-10-CM

## 2024-02-09 LAB — URINALYSIS, ROUTINE W REFLEX MICROSCOPIC
Bilirubin Urine: NEGATIVE
Glucose, UA: NEGATIVE mg/dL
Hgb urine dipstick: NEGATIVE
Ketones, ur: NEGATIVE mg/dL
Leukocytes,Ua: NEGATIVE
Nitrite: NEGATIVE
Protein, ur: 100 mg/dL — AB
Specific Gravity, Urine: 1.015 (ref 1.005–1.030)
pH: 7 (ref 5.0–8.0)

## 2024-02-09 LAB — CBC
HCT: 38 % — ABNORMAL LOW (ref 39.0–52.0)
Hemoglobin: 12.8 g/dL — ABNORMAL LOW (ref 13.0–17.0)
MCH: 32.7 pg (ref 26.0–34.0)
MCHC: 33.7 g/dL (ref 30.0–36.0)
MCV: 96.9 fL (ref 80.0–100.0)
Platelets: 148 K/uL — ABNORMAL LOW (ref 150–400)
RBC: 3.92 MIL/uL — ABNORMAL LOW (ref 4.22–5.81)
RDW: 14.8 % (ref 11.5–15.5)
WBC: 7.3 K/uL (ref 4.0–10.5)
nRBC: 0 % (ref 0.0–0.2)

## 2024-02-09 LAB — TYPE AND SCREEN
ABO/RH(D): A POS
Antibody Screen: NEGATIVE

## 2024-02-09 LAB — BASIC METABOLIC PANEL WITH GFR
Anion gap: 8 (ref 5–15)
BUN: 22 mg/dL (ref 8–23)
CO2: 35 mmol/L — ABNORMAL HIGH (ref 22–32)
Calcium: 9.7 mg/dL (ref 8.9–10.3)
Chloride: 97 mmol/L — ABNORMAL LOW (ref 98–111)
Creatinine, Ser: 1.5 mg/dL — ABNORMAL HIGH (ref 0.61–1.24)
GFR, Estimated: 46 mL/min — ABNORMAL LOW
Glucose, Bld: 87 mg/dL (ref 70–99)
Potassium: 4.2 mmol/L (ref 3.5–5.1)
Sodium: 140 mmol/L (ref 135–145)

## 2024-02-09 LAB — PROTIME-INR
INR: 1.1 (ref 0.8–1.2)
Prothrombin Time: 14.9 s (ref 11.4–15.2)

## 2024-02-09 LAB — SURGICAL PCR SCREEN
MRSA, PCR: NEGATIVE
Staphylococcus aureus: NEGATIVE

## 2024-02-09 LAB — ABO/RH: ABO/RH(D): A POS

## 2024-02-09 SURGERY — ECHOCARDIOGRAM, TRANSESOPHAGEAL
Anesthesia: General

## 2024-02-09 MED ORDER — POTASSIUM CHLORIDE 2 MEQ/ML IV SOLN
80.0000 meq | INTRAVENOUS | Status: DC
  Filled 2024-02-09: qty 40

## 2024-02-09 MED ORDER — MAGNESIUM SULFATE 50 % IJ SOLN
40.0000 meq | INTRAMUSCULAR | Status: DC
  Filled 2024-02-09: qty 9.85

## 2024-02-09 MED ORDER — DEXMEDETOMIDINE HCL IN NACL 400 MCG/100ML IV SOLN
0.1000 ug/kg/h | INTRAVENOUS | Status: DC
  Filled 2024-02-09: qty 100

## 2024-02-09 MED ORDER — HEPARIN 30,000 UNITS/1000 ML (OHS) CELLSAVER SOLUTION
Status: DC
  Filled 2024-02-09: qty 1000

## 2024-02-09 MED ORDER — NOREPINEPHRINE 4 MG/250ML-% IV SOLN
0.0000 ug/min | INTRAVENOUS | Status: DC
  Filled 2024-02-09: qty 250

## 2024-02-09 MED ORDER — CEFAZOLIN SODIUM-DEXTROSE 2-4 GM/100ML-% IV SOLN
2.0000 g | INTRAVENOUS | Status: DC
  Filled 2024-02-09: qty 100

## 2024-02-09 MED ORDER — ENOXAPARIN SODIUM 40 MG/0.4ML IJ SOSY
40.0000 mg | PREFILLED_SYRINGE | Freq: Every day | INTRAMUSCULAR | Status: DC
  Administered 2024-02-09 – 2024-02-17 (×9): 40 mg via SUBCUTANEOUS
  Filled 2024-02-09 (×9): qty 0.4

## 2024-02-09 MED ORDER — IPRATROPIUM-ALBUTEROL 0.5-2.5 (3) MG/3ML IN SOLN
3.0000 mL | Freq: Four times a day (QID) | RESPIRATORY_TRACT | Status: DC | PRN
  Administered 2024-02-11 (×2): 3 mL via RESPIRATORY_TRACT
  Filled 2024-02-09 (×2): qty 3

## 2024-02-09 NOTE — Plan of Care (Signed)
" °  Problem: Education: Goal: Knowledge of General Education information will improve Description: Including pain rating scale, medication(s)/side effects and non-pharmacologic comfort measures Outcome: Progressing   Problem: Clinical Measurements: Goal: Will remain free from infection Outcome: Progressing   Problem: Elimination: Goal: Will not experience complications related to urinary retention Outcome: Progressing   Problem: Pain Managment: Goal: General experience of comfort will improve and/or be controlled Outcome: Progressing   Problem: Clinical Measurements: Goal: Respiratory complications will improve Outcome: Not Progressing   "

## 2024-02-09 NOTE — Progress Notes (Signed)
 Pre Surgical Assessment: 5 M Walk Test  31M=16.66ft  5 Meter Walk Test- trial 1: 6.5 seconds 5 Meter Walk Test- trial 2: 7.4 seconds 5 Meter Walk Test- trial 3: 7.2 seconds 5 Meter Walk Test Average: 7.03 seconds  [facility] completed.

## 2024-02-09 NOTE — Progress Notes (Addendum)
 "  Progress Note  Patient Name: Jacob Montes Date of Encounter: 02/09/2024 [facility] Cardiologist: Evalene Lunger, MD   Interval Summary    Feeling well this morning. Sitting on the side of the bed, wife in the room.  Vital Signs Vitals:   02/09/24 0006 02/09/24 0200 02/09/24 0418 02/09/24 0829  BP: (!) 104/59  (!) 105/51 (!) 118/41  Pulse: 77 71 76 66  Resp: 18  16 17   Temp: 98.1 F (36.7 C)  98.2 F (36.8 C) 98 F (36.7 C)  TempSrc: Oral  Oral Oral  SpO2: 94% 94% 98% 98%  Weight:   92.4 kg   Height:        Intake/Output Summary (Last 24 hours) at 02/09/2024 0846 Last data filed at 02/09/2024 0831 Gross per 24 hour  Intake 540 ml  Output 1040 ml  Net -500 ml      02/09/2024    4:18 AM 02/08/2024    5:00 AM 02/07/2024    5:07 AM  Last 3 Weights  Weight (lbs) 203 lb 12.8 oz 203 lb 9.6 oz 203 lb  Weight (kg) 92.443 kg 92.352 kg 92.08 kg      Telemetry/ECG   Sinus Rhythm, PVCs - Personally Reviewed  Physical Exam  GEN: No acute distress.   Neck: No JVD Cardiac: RRR, 2/6 systolic murmur, no rubs, or gallops.  Respiratory: Clear to auscultation bilaterally. GI: Soft, nontender, non-distended  MS: 2+ bilateral LE edema  Assessment & Plan   83 year old male with a history of CAD status post CABG x 3 in June 2009, severe aortic stenosis status post TAVR in December 2018, hypertension, hyperlipidemia, statin intolerance, stroke, carotid stenosis status post left carotid endarterectomy in 2009 complicated by right laryngeal nerve paralysis, HFpEF, nephrolithiasis, remote tobacco abuse, sleep apnea, and remote PE '12 who presented with progressive heart failure.    Acute hypoxemic respiratory failure Acute on chronic HFpEF -- Admitted for the second time in December due to progressive dyspnea on exertion and volume overload in the setting of progressive bioprosthetic aortic valve stenosis -- Echo 12/23 with LVEF of 50 to 55%, grade 2 diastolic  dysfunction, severe aortic stenosis with mean aortic valve gradient of 54 mmHg -- [facility] 12/26 wedge of 22, PA 55/17 (30), with 3 of 3 patent grafts no targets for intervention. -- s/p IV lasix  and metolazone  2.5 mg. Holding additional diuretics. 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 No thrombosis noted. Discussed with MD, with plan for potential TAVR in TAVR on Friday.   Mild AKI -- Cr with slight increase to 1.2>>1.32>>1.34>>1.5, will hold additional IV lasix  and metolazone  for now    CAD s/p three-vessel CABG '09 -- Left heart cath 12/26 with 3 out of 3 patent grafts, no targets for intervention to native vessel disease -- continue aspirin , beta-blocker and statin   HTN -- stable -- continue Toprol  25mg  daily    HLD -- continue crestor  40mg  daily     For questions or updates, please contact [facility] Please consult [email or web address] for contact info under   Signed, Manuelita Rummer, NP    ATTENDING ATTESTATION:  After conducting a review of  all available clinical information with the care team, interviewing the patient, and performing a physical exam, I agree with the findings and plan described in this note with adjustments as indicated below which were discussed and enacted by staff above.   GEN: No acute distress, AO x 3 HEENT:  MMM, no JVD, no scleral icterus Cardiac: RRR, no murmurs, rubs, or gallops.  Respiratory: Clear to auscultation bilaterally. GI: Soft, nontender, non-distended  MS: No edema; No deformity. Neuro:  Nonfocal  Vasc:  +2 radial pulses  Patient doing well this morning.  Breathing  is stable.  No acute events  Acute on chronic diastolic HF NYHA IV now medically stabilized  Due to TAVR valve degeneration Reviewed TAVR CTA, planning 26 [employer] without cerebral protection (anatomy not amenable) Ungrafted RCA does not seem to be at jeopardy Looks euvolemic Hold lasix  and metolazone  given Cr up to 1.5 Bioprosthetic AV degeneration Previous 23 S3 2018 Planning for TAV in TAV Friday 1/2 Discussed with patient and wife at bedside Plan on Eliquis 5mg  BID x 3 months following TAVR procedure with d/c of ASA AKI Holding diuretics as above Cr up to 1.5 CAD s/p CABG Patent LIMA to LAD, VG to OM1 and VG to OM2 cath 12/26 ASA 81 Crestor  40mg  HL Crestor  40mg    NYHA IV  STS Score   Risk of Mortality:  5.854%  Renal Failure:  5.969%  Permanent Stroke:  1.582%  Prolonged Ventilation:  15.848%  DSW Infection:  0.571%  Reoperation:  5.034%  Morbidity or Mortality:  21.272%  Short Length of Stay:  16.480%  Long Length of Stay:  9.940%   Lurena Red, MD Pager [phone]   "

## 2024-02-09 NOTE — Consult Note (Addendum)
 "     [address] [place] [address]             [phone]      TYSEAN VANDERVLIET [facility] Medical Record #[id number] Date of Birth: [birth year]  Referring: Darron Deatrice LABOR, MD Primary Care: [name], MD Primary Cardiologist:Timothy Perla, MD  Chief Complaint:   No chief complaint on file.   History of Present Illness:     Jacob Montes is a 83 y.o. male presents for surgical evaluation of  acute decompensated heart failure likely secondary to rapidly progressive prosthetic aortic valve stenosis.  He had a 23mm Edwards Sapien TAVR valve placed 01/2017 in [place]  (see media tab for details).  This is his second heart failure hospitalization this month.  He failed outpatient diuretic therapy, including outpatient IV diuresis.       Past Medical History:  Diagnosis Date   Actinic keratosis    Acute exacerbation of CHF (congestive heart failure) (HCC) 01/19/2024   Aortic stenosis s/p TAVR    a. 01/2017 s/p TAVR - 23mm Sapien; b. 11/2023 Echo: EF 50-55%, GrII DD, nl RV fxn, mild-mod MR, mild Ca2+ MS, Mod AS, AVA 0.84, mean gradient 32 mmHg, mild perivalvular leak; c. 01/2024 Echo: EF 50-55%, no rwma, GrII DD, nl RV fxn, mod dil LA, mildly dil RA, mild MR/MS, sev AS w/ mod perivalvular leak - mean grad , Vmax 4.52 m/s, DVI 0.13.   CAD (coronary artery disease)    a. 07/2007 s/p CABG x 3 [facility] [place]); b. 12/2023 MV: No isch/infarct; c. 01/2024 Cath: LM 95ost/m, LAD 165m, LCX 100ost/p/m, OM3 40, RCA small, 95p, 61m, RPDA fills via collats from LAD. VG->OM1 nl, VG->OM3 nl, LIMA->LAD nl.   Cancer (HCC)    Skin   Carotid artery disease    a. 2009 s/p left sided CEA complicated by RLN parlaysis; b. 02/2023 U/S: no significant dzs.   Cataract    Chronic heart failure with preserved ejection fraction (HFpEF) (HCC)    a. 01/2024 Echo: EF 50-55%, GrII DD, nl RV fxn, mod dil LA, mild MR/MS, severe AS, mod perivalvular leak; b. 01/2024 RHC: RA 3,  RV 66/2, PA 55/17 (30), PCWP 20.   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   GERD (gastroesophageal reflux disease)    HLD (hyperlipidemia)    Hyperlipidemia 10/15/2015   Hypertension    Nephrolithiasis 11/16/2018   Pulmonary embolism (HCC) 08/2010   Skin cancer    Sleep apnea    Stroke [facility])     Past Surgical History:  Procedure Laterality Date   CARDIAC CATHETERIZATION     CARDIAC VALVE REPLACEMENT  01-19-17   Carotid surgery Left 03/08/2007   CHOLECYSTECTOMY  02/16/2018   COLONOSCOPY WITH PROPOFOL  N/A 09/01/2022   Procedure: COLONOSCOPY WITH PROPOFOL ;  Surgeon: Maryruth Ole DASEN, MD;  Location: [facility] ENDOSCOPY;  Service: Endoscopy;  Laterality: N/A;   CORONARY ARTERY BYPASS GRAFT     ENDOSCOPIC RETROGRADE CHOLANGIOPANCREATOGRAPHY (ERCP) WITH PROPOFOL  N/A 07/10/2021   Procedure: ENDOSCOPIC RETROGRADE CHOLANGIOPANCREATOGRAPHY (ERCP) WITH PROPOFOL ;  Surgeon: Jinny Carmine, MD;  Location: [facility] ENDOSCOPY;  Service: Endoscopy;  Laterality: N/A;   ERCP N/A 11/18/2018   Procedure: ENDOSCOPIC RETROGRADE CHOLANGIOPANCREATOGRAPHY (ERCP);  Surgeon: Jinny Carmine, MD;  Location: [facility] ENDOSCOPY;  Service: Endoscopy;  Laterality: N/A;   EUS N/A 08/21/2021   Procedure: UPPER ENDOSCOPIC ULTRASOUND (EUS) LINEAR;  Surgeon: Burbridge, Asberry LABOR, MD;  Location: [facility] ENDOSCOPY;  Service: Gastroenterology;  Laterality: N/A;  [facility]   EYE SURGERY  04-15-99   09-14-99   Heart Bypass N/A 07/2007   HERNIA REPAIR  03-12-88   Not sure of date   left shoulder surgery Left 06/03/2009   POLYPECTOMY  09/01/2022   Procedure: POLYPECTOMY;  Surgeon: Maryruth Ole DASEN, MD;  Location: [facility] ENDOSCOPY;  Service: Endoscopy;;   RIGHT AND LEFT HEART CATH  07/18/2007   right shoulder surgery  2003   RIGHT/LEFT HEART CATH AND CORONARY/GRAFT ANGIOGRAPHY N/A 02/04/2024   Procedure: RIGHT/LEFT HEART CATH AND CORONARY/GRAFT ANGIOGRAPHY;  Surgeon: Darron Deatrice LABOR, MD;  Location:  [facility] INVASIVE CV LAB;  Service: Cardiovascular;  Laterality: N/A;   TEE WITHOUT CARDIOVERSION N/A 11/21/2018   Procedure: TRANSESOPHAGEAL ECHOCARDIOGRAM (TEE);  Surgeon: Darron Deatrice LABOR, MD;  Location: [facility] ORS;  Service: Cardiovascular;  Laterality: N/A;   TVAR      Social History:  Tobacco Use History[1]  Social History   Substance and Sexual Activity  Alcohol Use Never     Allergies[2]    Current Facility-Administered Medications  Medication Dose Route Frequency Provider Last Rate Last Admin   acetaminophen  (TYLENOL ) tablet 650 mg  650 mg Oral Q4H PRN Haley, Sheng L, PA-C       aspirin  EC tablet 81 mg  81 mg Oral Daily Haley, Sheng L, PA-C   81 mg at 02/08/24 2054   budesonide  (PULMICORT ) nebulizer solution 0.25 mg  0.25 mg Nebulization BID Haley, Sheng L, PA-C   0.25 mg at 02/09/24 0850   [START ON 02/11/2024] ceFAZolin (ANCEF) IVPB 2g/100 mL premix  2 g Intravenous To OR Thukkani, Arun K, MD       [START ON 02/11/2024] dexmedetomidine  (PRECEDEX ) 400 MCG/100ML (4 mcg/mL) infusion  0.1-0.7 mcg/kg/hr Intravenous To OR Thukkani, Arun K, MD       enoxaparin  (LOVENOX ) injection 40 mg  40 mg Subcutaneous Daily Henry Shaver B, NP   40 mg at 02/09/24 1006   [START ON 02/11/2024] heparin  30,000 units/NS 1000 mL solution for CELLSAVER   Other To OR Thukkani, Arun K, MD       ipratropium-albuterol  (DUONEB) 0.5-2.5 (3) MG/3ML nebulizer solution 3 mL  3 mL Nebulization Q6H PRN Thukkani, Arun K, MD       [START ON 02/11/2024] magnesium  sulfate (IV Push/IM) injection 40 mEq  40 mEq Other To OR Thukkani, Arun K, MD       metoprolol  succinate (TOPROL -XL) 24 hr tablet 25 mg  25 mg Oral Daily Haley, Sheng L, PA-C   25 mg at 02/09/24 1006   montelukast  (SINGULAIR ) tablet 10 mg  10 mg Oral QHS PRN Haley, Sheng L, PA-C       multivitamin with minerals tablet 1 tablet  1 tablet Oral Daily Darryle Currier L, PA-C   1 tablet at 02/09/24 1006   nitroGLYCERIN  (NITROSTAT ) SL tablet 0.4 mg  0.4 mg Sublingual Q5  Min x 3 PRN Haley, Sheng L, PA-C       [START ON 02/11/2024] norepinephrine (LEVOPHED) 4mg  in (0.016 mg/mL) premix infusion  0-10 mcg/min Intravenous To OR Thukkani, Arun K, MD       ondansetron  (ZOFRAN ) injection 4 mg  4 mg Intravenous Q6H PRN Haley, Sheng L, PA-C   4 mg at 02/07/24 9271   pantoprazole  (PROTONIX ) EC tablet 40 mg  40 mg Oral Q0600 Haley, Sheng L, PA-C   40 mg at 02/09/24 0535   polyethylene glycol (MIRALAX  / GLYCOLAX ) packet 17 g  17 g  Oral Daily PRN Haley, Sheng L, PA-C       [START ON 02/11/2024] potassium chloride  injection 80 mEq  80 mEq Other To OR Thukkani, Arun K, MD       potassium chloride  SA (KLOR-CON  M) CR tablet 40 mEq  40 mEq Oral Daily Vivienne Lonni Ingle, NP   40 mEq at 02/09/24 1005   rosuvastatin  (CRESTOR ) tablet 40 mg  40 mg Oral Daily Haley, Sheng L, PA-C   40 mg at 02/08/24 2054    Medications Prior to Admission  Medication Sig Dispense Refill Last Dose/Taking   acetaminophen  (TYLENOL ) 325 MG tablet Take 2 tablets (650 mg total) by mouth every 6 (six) hours as needed for mild pain (pain score 1-3) or fever (or Fever >/= 101).   Past Week   amLODipine  (NORVASC ) 10 MG tablet Take 10 mg by mouth daily.   Past Week   aspirin  81 MG chewable tablet Chew 81 mg by mouth daily.   Past Week   budesonide  (PULMICORT ) 0.25 MG/2ML nebulizer solution Take 2 mLs (0.25 mg total) by nebulization 2 (two) times daily. (Patient taking differently: Take 0.25 mg by nebulization 2 (two) times daily as needed (for shortness of breath).)   02/06/2024 Morning   docusate sodium  (COLACE) 100 MG capsule Take 100 mg by mouth daily as needed for mild constipation.   Past Week   furosemide  (LASIX ) 10 MG/ML injection Inject 4 mLs (40 mg total) into the vein 2 (two) times daily.   Unknown   furosemide  (LASIX ) 40 MG tablet Take 60 mg by mouth daily.   Past Week   metoprolol  succinate (TOPROL -XL) 25 MG 24 hr tablet Take 1 tablet (25 mg total) by mouth daily. (Patient taking differently:  Take 25 mg by mouth every evening.)   Past Week   montelukast  (SINGULAIR ) 10 MG tablet Take 1 tablet (10 mg total) by mouth at bedtime as needed (allergies). 30 tablet 3 Past Week   Multiple Vitamin (MULTIVITAMIN WITH MINERALS) TABS tablet Take 1 tablet by mouth daily.   Past Week   omeprazole (PRILOSEC) 20 MG capsule Take 20 mg by mouth daily.   Past Week   potassium chloride  SA (KLOR-CON  M) 20 MEQ tablet Take 2 tablets (40 mEq total) by mouth 2 (two) times daily. (Patient taking differently: Take 20 mEq by mouth daily.)   Past Week   Probiotic Product (PROBIOTIC PO) Take 1 tablet by mouth daily.   Past Week   rosuvastatin  (CRESTOR ) 20 MG tablet Take 20 mg by mouth daily.   Past Week   heparin  25000 UT/250ML infusion Inject 1,100 Units/hr into the vein continuous. (Patient not taking: Reported on 02/06/2024)   Not Taking   ipratropium-albuterol  (DUONEB) 0.5-2.5 (3) MG/3ML SOLN Take 3 mLs by nebulization 2 (two) times daily. (Patient not taking: Reported on 02/06/2024)   Not Taking   Morphine  Sulfate (MORPHINE , PF,) 2 MG/ML injection Inject 1 mL (2 mg total) into the vein every 4 (four) hours as needed. (Patient not taking: Reported on 02/06/2024)   Not Taking   ondansetron  (ZOFRAN ) 4 MG tablet Take 1 tablet (4 mg total) by mouth every 6 (six) hours as needed for nausea. (Patient not taking: Reported on 02/06/2024)   Not Taking   ondansetron  (ZOFRAN ) 4 MG/2ML SOLN injection Inject 2 mLs (4 mg total) into the vein every 6 (six) hours as needed for nausea. (Patient not taking: Reported on 02/06/2024)   Not Taking   oxyCODONE  (OXY IR/ROXICODONE ) 5 MG immediate release tablet  Take 1 tablet (5 mg total) by mouth every 4 (four) hours as needed for moderate pain (pain score 4-6). (Patient not taking: Reported on 02/06/2024)   Not Taking   pantoprazole  (PROTONIX ) 40 MG tablet Take 1 tablet (40 mg total) by mouth 2 (two) times daily before a meal. (Patient not taking: Reported on 02/06/2024)   Not Taking    polyethylene glycol (MIRALAX  / GLYCOLAX ) 17 g packet Take 17 g by mouth daily as needed for mild constipation. (Patient not taking: Reported on 02/06/2024)   Not Taking   rosuvastatin  (CRESTOR ) 40 MG tablet Take 1 tablet (40 mg total) by mouth daily. (Patient not taking: Reported on 02/06/2024)   Not Taking   sodium chloride  flush (NS) 0.9 % SOLN Inject 3 mLs into the vein every 12 (twelve) hours.      sodium chloride  flush (NS) 0.9 % SOLN Inject 3 mLs into the vein as needed.       Family History  Problem Relation Age of Onset   Hypertension Mother    Stroke Mother    Lung cancer Father    Alcohol abuse Father    Non-Hodgkin's lymphoma Brother    Varicose Veins Brother      Review of Systems:   Review of Systems  Constitutional:  Positive for malaise/fatigue.  Cardiovascular:  Positive for chest pain and leg swelling.      Physical Exam: BP (!) 118/41 (BP Location: Right Arm)   Pulse 66   Temp 98 F (36.7 C) (Oral)   Resp 18   Ht 5' 6 (1.676 m)   Wt 92.4 kg   SpO2 95%   BMI 32.89 kg/m  Physical Exam Constitutional:      General: He is not in acute distress.    Appearance: He is not ill-appearing.  HENT:     Head: Normocephalic and atraumatic.  Cardiovascular:     Rate and Rhythm: Normal rate.  Pulmonary:     Effort: Pulmonary effort is normal. No respiratory distress.  Abdominal:     General: There is no distension.  Neurological:     General: No focal deficit present.     Mental Status: He is alert and oriented to person, place, and time.       Cardiac Studies & Procedures   ______________________________________________________________________________________________ CARDIAC CATHETERIZATION  CARDIAC CATHETERIZATION 02/04/2024  Conclusion   Ost LM to Mid LM lesion is 95% stenosed.   Ost Cx to Prox Cx lesion is 100% stenosed.   Mid LAD lesion is 100% stenosed.   Mid Cx lesion is 100% stenosed.   3rd Mrg lesion is 40% stenosed.   Prox RCA lesion is  95% stenosed.   Mid RCA lesion is 80% stenosed.   SVG graft was visualized by angiography and is normal in caliber.   SVG graft was visualized by angiography.   LIMA graft was visualized by angiography and is normal in caliber.   The graft exhibits no disease.   The graft exhibits mild focal disease.   The graft exhibits no disease.  1.  Significant underlying three-vessel coronary artery disease with patent grafts including LIMA to LAD, SVG to OM1 and SVG to OM 3.  There is significant disease in the right coronary artery but the vessel is small.  Significant ostial left main stenosis supplying only a first diagonal branch. 2.  Left ventricular angiography was not performed.  EF was normal by echo.  I did not attempt to cross the TAVR prosthesis given concerns  about leaflet thrombosis. 3.  Right heart catheterization showed normal right atrial pressure, moderate pulmonary hypertension, moderately elevated wedge pressure and normal cardiac output. RA: 3 mmHg, RV is 66/2 mmHg, PW is 22 mmHg with prominent V wave, PA: 55/17 with a mean of 30 mmHg.  Cardiac output/cardiac index: 6.31/3.1.  Recommendations: start heparin  drip 2 hours after TR band removal due to concerns about TAVR leaflet thrombosis. Continue IV diuresis. Recommend medical therapy for coronary artery disease.  No revascularization is needed. Continue evaluation for aortic valve valve in valve procedure.  Findings Coronary Findings Diagnostic  Dominance: Co-dominant  Left Main Ost LM to Mid LM lesion is 95% stenosed.  Left Anterior Descending Mid LAD lesion is 100% stenosed.  First Diagonal Branch Vessel is large in size.  Left Circumflex Ost Cx to Prox Cx lesion is 100% stenosed. Mid Cx lesion is 100% stenosed.  Second Obtuse Marginal Branch Collaterals 2nd Mrg filled by collaterals from 1st Mrg.  Third Obtuse Marginal Branch 3rd Mrg lesion is 40% stenosed.  Right Coronary Artery Vessel is small. Prox RCA  lesion is 95% stenosed. Mid RCA lesion is 80% stenosed.  Right Posterior Descending Artery Collaterals RPDA filled by collaterals from Dist LAD.  Saphenous Graft To 1st Mrg SVG graft was visualized by angiography and is normal in caliber.  The graft exhibits no disease.  Saphenous Graft To 3rd Mrg SVG graft was visualized by angiography.  The graft exhibits mild focal disease.  LIMA LIMA Graft To Dist LAD LIMA graft was visualized by angiography and is normal in caliber.  The graft exhibits no disease.  Intervention  No interventions have been documented.   STRESS TESTS  NM MYOCAR MULTI W/SPECT W 01/05/2024  Narrative   No ST deviation was noted.  Pharmacological myocardial perfusion imaging study with no significant  ischemia Small region fixed defect distal inferolateral wall, unable to exclude prior MI No focal wall motion abnormality, EF estimated at 30% (poor estimation in the setting of GI and attenuation artifact) No EKG changes concerning for ischemia at peak stress or in recovery. Low risk scan Recent echo with low normal ejection fraction 50 to 55%   Signed, Velinda Lunger, MD, Ph.D [facility]   ECHOCARDIOGRAM  ECHOCARDIOGRAM COMPLETE 02/01/2024  Narrative ECHOCARDIOGRAM REPORT    Patient Name:   KIMI KROFT Date of Exam: 02/01/2024 Medical Rec #:  [id number]     Height:       66.0 in Accession #:    [id number]    Weight:       209.0 lb Date of Birth:  [birth year]     BSA:          2.038 m Patient Age:    83 years      BP:           112/42 mmHg Patient Gender: M             HR:           77 bpm. Exam Location:  [facility]  Procedure: 2D Echo, Cardiac Doppler, Color Doppler and Intracardiac Opacification Agent (Both Spectral and Color Flow Doppler were utilized during procedure).  Indications:     I50.31 Acute Diastolic Heart Failure  History:         Patient has prior history of Echocardiogram examinations, most recent 11/11/2023. CAD, TAVR. Left  Carotid Endarterectomy. and Prior CABG, Stroke; Risk Factors:Dyslipidemia, Hypertension, Sleep Apnea and Former Smoker. Pulmonary embolism. Aortic Valve: 23 mm Sapien prosthetic, stented (TAVR) valve  is present in the aortic position. Procedure Date: 01/19/2017.  Sonographer:     Carl Coma RDCS Referring Phys:  [id number] [facility] PAUDEL Diagnosing Phys: Caron Poser  IMPRESSIONS   1. Left ventricular ejection fraction, by estimation, is 50 to 55%. The left ventricle has low normal function. The left ventricle has no regional wall motion abnormalities. The left ventricular internal cavity size was mildly dilated. Left ventricular diastolic parameters are consistent with Grade II diastolic dysfunction (pseudonormalization). 2. Right ventricular systolic function was not well visualized. The right ventricular size is not well visualized. 3. Left atrial size was moderately dilated. 4. Right atrial size was mildly dilated. 5. The mitral valve is degenerative. Mild mitral valve regurgitation. Mild calcific mitral stenosis. Severe mitral annular calcification. 6. There is a 23 mm Sapien prosthetic (TAVR) valve present in the aortic position. Procedure Date: 01/19/2017. Echo findings are consistent with severe stenosis and moderate perivalvular leak of the aortic prosthesis. Aortic valve mean gradient measures 54.0 mmHg. Aortic valve Vmax measures 4.52 m/s. DVI 0.13. 7. The inferior vena cava is dilated in size with >50% respiratory variability, suggesting right atrial pressure of 8 mmHg.  Comparison(s): A prior study was performed on 11/11/2023. TAVR indices now meet threshold for severe stenosis. Vmax previously 3.64, MG 32 mmHg, DVI 0.26. Recommend valve team consult.  FINDINGS Left Ventricle: Left ventricular ejection fraction, by estimation, is 50 to 55%. The left ventricle has low normal function. The left ventricle has no regional wall motion abnormalities. Definity  contrast agent  was given IV to delineate the left ventricular endocardial borders. The left ventricular internal cavity size was mildly dilated. There is no left ventricular hypertrophy. Left ventricular diastolic parameters are consistent with Grade II diastolic dysfunction (pseudonormalization).  Right Ventricle: The right ventricular size is not well visualized. Right vetricular wall thickness was not well visualized. Right ventricular systolic function was not well visualized.  Left Atrium: Left atrial size was moderately dilated.  Right Atrium: Right atrial size was mildly dilated.  Pericardium: There is no evidence of pericardial effusion.  Mitral Valve: The mitral valve is degenerative in appearance. There is severe calcification of the mitral valve leaflet(s). Moderate mitral annular calcification. Mild mitral valve regurgitation. Mild mitral valve stenosis.  Tricuspid Valve: The tricuspid valve is not well visualized. Tricuspid valve regurgitation is not demonstrated. No evidence of tricuspid stenosis.  There is a 23 mm Sapien prosthetic, stented (TAVR) valve present in the aortic position. Procedure Date: 01/19/2017. Pulmonic Valve: The pulmonic valve was not well visualized. Pulmonic valve regurgitation is not visualized. No evidence of pulmonic stenosis.  Aorta: The aortic root is normal in size and structure.  Venous: The inferior vena cava is dilated in size with greater than 50% respiratory variability, suggesting right atrial pressure of 8 mmHg.  IAS/Shunts: The interatrial septum was not well visualized.   LEFT VENTRICLE PLAX 2D LVIDd:         6.30 cm Diastology LVIDs:         5.10 cm LV e' medial:    4.57 cm/s LV PW:         1.20 cm LV E/e' medial:  30.4 LV IVS:        0.70 cm LV e' lateral:   4.86 cm/s LV E/e' lateral: 28.6   RIGHT VENTRICLE             IVC RV Basal diam:  5.20 cm     IVC diam: 2.10 cm RV S prime:     10.09  cm/s TAPSE (M-mode): 2.2 cm  LEFT ATRIUM              Index        RIGHT ATRIUM           Index LA diam:        5.80 cm 2.85 cm/m   RA Area:     18.30 cm LA Vol (A2C):   99.2 ml 48.69 ml/m  RA Volume:   53.50 ml  26.26 ml/m LA Vol (A4C):   79.4 ml 38.97 ml/m LA Biplane Vol: 90.8 ml 44.56 ml/m AORTIC VALVE AV Vmax:           452.00 cm/s AV Vmean:          331.600 cm/s AV VTI:            0.942 m AV Peak Grad:      81.7 mmHg AV Mean Grad:      54.0 mmHg LVOT Vmax:         56.93 cm/s LVOT Vmean:        38.600 cm/s LVOT VTI:          0.108 m LVOT/AV VTI ratio: 0.12 AI PHT:            199 msec  AORTA Ao Root diam: 2.90 cm  MITRAL VALVE MV Area (PHT): 4.26 cm     SHUNTS MV Decel Time: 178 msec     Systemic VTI: 0.11 m MR Peak grad: 145.7 mmHg MR Mean grad: 85.5 mmHg MR Vmax:      603.50 cm/s MR Vmean:     430.0 cm/s MV E velocity: 139.00 cm/s MV A velocity: 73.85 cm/s MV E/A ratio:  1.88  Caron Poser Electronically signed by Caron Poser Signature Date/Time: 02/02/2024/9:07:08 AM    Final   TEE  ECHO TEE 11/21/2018  Narrative TRANSESOPHOGEAL ECHO REPORT    Patient Name:   FAISAL STRADLING Date of Exam: 11/21/2018 Medical Rec #:  [id number]   Height:       66.0 in Accession #:    [id number]  Weight:       226.0 lb Date of Birth:  1940/05/27   BSA:          2.11 m Patient Age:    78 years    BP:           149/52 mmHg Patient Gender: M           HR:           65 bpm. Exam Location:  [facility]   Procedure: Transesophageal Echo, Color Doppler, Cardiac Doppler and Saline Contrast Bubble Study  Indications:     Bateremia 790.7  History:         Patient has prior history of Echocardiogram examinations, most recent 11/17/2018. Stroke Risk Factors:Hypertension. TAVR.  Sonographer:     Christopher Furnace RDCS (AE) Referring Phys:  [id number] BERNARDINO HERO DUNN Diagnosing Phys: Deatrice Cage MD    PROCEDURE: No evidence of vegetation. After discussion of the risks and benefits of a TEE, an informed consent was obtained from the  patient. Local oropharyngeal anesthetic was provided with Benzocaine  spray and viscous lidocaine . The transesophogeal probe was passed through the esophogus of the patient. Imaged were obtained with the patient in a left lateral decubitus position. Image quality was good. The patient's vital signs; including heart rate, blood pressure, and oxygen saturation; remained stable throughout the procedure. The patient developed no complications during the procedure.  IMPRESSIONS  1. Left ventricular ejection fraction, by visual estimation, is 60 to 65%. The left ventricle has normal function. Normal left ventricular size. There is mildly increased left ventricular hypertrophy. 2. Left ventricular diastolic function could not be evaluated pattern of LV diastolic filling. 3. Global right ventricle has normal systolic function.The right ventricular size is normal. No increase in right ventricular wall thickness. 4. Left atrial size was normal. 5. Right atrial size was normal. 6. The mitral valve is grossly normal. No evidence of mitral valve regurgitation. Mild mitral stenosis. 7. The tricuspid valve is normal in structure. Tricuspid valve regurgitation is trivial. 8. Aortic valve regurgitation is trivial by color flow Doppler. Normal functioning TAVR prosthesis with trivial perivalvular regurgitation. 9. Structurally normal aortic valve, with no evidence of sclerosis or stenosis. 10. The pulmonic valve was normal in structure. Pulmonic valve regurgitation is not visualized by color flow Doppler. 11. TR signal is inadequate for assessing pulmonary artery systolic pressure. 12. No vegetations to suggest endocarditis.  FINDINGS Left Ventricle: Left ventricular ejection fraction, by visual estimation, is 60 to 65%. The left ventricle has normal function. There is mildly increased left ventricular hypertrophy. Normal left ventricular size. Spectral Doppler shows Left ventricular diastolic function could not  be evaluated pattern of LV diastolic filling.  Right Ventricle: The right ventricular size is normal. No increase in right ventricular wall thickness. Global RV systolic function is has normal systolic function.  Left Atrium: Left atrial size was normal in size.  Right Atrium: Right atrial size was normal in size  Pericardium: There is no evidence of pericardial effusion.  Mitral Valve: The mitral valve is grossly normal. Mild mitral valve stenosis by observation. No evidence of mitral valve regurgitation.  Tricuspid Valve: The tricuspid valve is normal in structure. Tricuspid valve regurgitation is trivial by color flow Doppler.  Aortic Valve: The aortic valve has been repaired/replaced. Aortic valve regurgitation is trivial by color flow Doppler. The aortic valve is structurally normal, with no evidence of sclerosis or stenosis. TAVR valve is present in the aortic position.  Pulmonic Valve: The pulmonic valve was normal in structure. Pulmonic valve regurgitation is not visualized by color flow Doppler.  Aorta: The aortic root, ascending aorta and aortic arch are all structurally normal, with no evidence of dilitation or obstruction.  Venous: The inferior vena cava was not well visualized.  Shunts: Agitated saline contrast was given intravenously to evaluate for intracardiac shunting. No ventricular septal defect is seen or detected. There is no evidence of an atrial septal defect. No atrial level shunt detected by color flow Doppler.   Deatrice Cage MD Electronically signed by Deatrice Cage MD Signature Date/Time: 11/21/2018/9:19:18 AM    Final  MONITORS  LONG TERM MONITOR (3-14 DAYS) 03/13/2020  Narrative Event Monitor Patch Wear Time:  14 days and 0 hours (2022-01-12T09:55:11-498 to 2022-01-26T09:55:14-0500)  Normal sinus rhythm Patient had a min HR of 43 bpm and avg HR of 60 bpm.  1 run of Ventricular Tachycardia occurred lasting 6 beats with a max rate of 266 bpm  (avg 238 bpm).  11 Supraventricular Tachycardia runs occurred, the run with the fastest interval lasting 5 beats with a max rate of 174 bpm, the longest lasting 9 beats with an avg rate of 112 bpm.  Isolated SVEs were rare (<1.0%), SVE Couplets were rare (<1.0%), and SVE Triplets were rare (<1.0%). Isolated VEs were rare (<1.0%), VE Couplets were rare (<1.0%), and no VE Triplets were present. Ventricular Bigeminy and Trigeminy were present.  Patient triggered  events were not associated with significant arrhythmia  Signed, Velinda Lunger, MD, Ph.D [facility]   CT SCANS  CT CORONARY [facility] W/CTA COR W/SCORE 02/07/2024  Narrative CLINICAL DATA:  Aortic Valve pathology with assessment for TAVR  EXAM: Cardiac TAVR CT  TECHNIQUE: The patient was scanned on a Siemens Force 192 slice scanner. A 120 kV retrospective scan was triggered in the descending thoracic aorta at 111 HU's. Gantry rotation speed was 270 msecs and collimation was .9 mm. No beta blockade or nitro were given. The 3D data set was reconstructed in 5% intervals of the R-R cycle. Systolic and diastolic phases were analyzed on a dedicated work station using MPR, MIP and VRT modes. The patient received 100 cc of contrast.  FINDINGS: Prosthetic Valve: 23 mm Edward Sapien valve  Hypoattenuation and degree: Calcification of the noncoronary cusp prosthetic leaflet and base of the left cusp prosthetic leaflet.  Valve to coronary distance (left main) - 3 mm.  Valve to coronary distance (RCA) - 4 mm.  Severe Annular calcification (Calcification into the LVOT).  No paravalvular leak.  Mitral Valve: Anterior valve leaflet calcification into the intervalvular fibrosa  Internal diameter  Major annulus diameter: 21 mm  Minor annulus diameter: 19 mm  Aortic Measurements- 95% phase  Sinotubular Junction: 29 mm  Ascending Thoracic Aorta: 29 mm  Descending Thoracic Aorta: 26 mm  Sinus of Valsalva  Measurements:  Right coronary cusp width: 31 mm  Left coronary cusp width: 29 mm  Non coronary cusp width: 30 mm  Optimum Fluoroscopic Angle for Delivery: LAO 4, CAU 6  Prosthetic Valve- Left Main Coronary angle: RAO 0, CAU 11  Scenes are saved for both a 23 mm Sapien Valve and a 26 mm Sapien Valve (RCA VTC 5 mm, LVOT area 5.44 cm2)  Non TAVR Valve Findings:  Coronary Arteries: Normal coronary origin. Study not completed with nitroglycerin . Incomplete evaluation-LIMA to LAD is distally patent.  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 No evidence of interatrial communication. Consider left arm Bubble study if clinically indicated.  Main Pulmonary artery: Moderate dilation 33 mm.  Pulmonary veins: Normal variant right middle vein.  Left atrial appendage: Patent.  Interatrial septum: No clear communication.  Chamber dimensions: Biventricular dilation.  Pericardium: No calcification.  Extra Cardiac Findings as per separate reporting.  Notable artifacts: Incomplete opacification  Image quality: Fair  IMPRESSION: 1. Bioprosthetic Aortic stenosis. Findings pertinent to TAVR procedure are detailed above.  RECOMMENDATIONS:  The proposed cut-off value of 1,651 AU yielded a 93 % sensitivity and 75 % specificity in grading AS severity in patients with classical low-flow, low-gradient AS. Proposed different cut-off values to define severe AS for men and women as 2,065 AU and 1,274 AU, respectively. The joint European and American recommendations for the assessment of AS consider the aortic valve calcium  score as a continuum - a very high calcium  score suggests severe AS and a low calcium  score suggests severe AS is unlikely.  Donney VEAR Jarome LULLA Stephen RENETTE, et al. 2017 ESC/EACTS Guidelines for the management of valvular heart disease. Eur Heart J [phone]  Coronary artery calcium  (CAC) score is a strong predictor of incident coronary heart disease (CHD)  and provides predictive information beyond traditional risk factors. CAC scoring is reasonable to use in the decision to withhold, postpone, or initiate statin therapy in intermediate-risk or selected borderline-risk asymptomatic adults (age 42-75 years and LDL-C >=70 to <190 mg/dL) who do not have diabetes or established atherosclerotic cardiovascular disease (ASCVD).* In intermediate-risk (  10-year ASCVD risk >=7.5% to <20%) adults or selected borderline-risk (10-year ASCVD risk >=5% to <7.5%) adults in whom a CAC score is measured for the purpose of making a treatment decision the following recommendations have been made:  If CAC = 0, it is reasonable to withhold statin therapy and reassess in 5 to 10 years, as long as higher risk conditions are absent (diabetes mellitus, family history of premature CHD in first degree relatives (males <55 years; females <65 years), cigarette smoking, LDL >=190 mg/dL or other independent risk factors).  If CAC is 1 to 99, it is reasonable to initiate statin therapy for patients >=18 years of age.  If CAC is >=100 or >=75th percentile, it is reasonable to initiate statin therapy at any age.  Cardiology referral should be considered for patients with CAC scores >=400 or >=75th percentile.  *2018 AHA/ACC/AACVPR/AAPA/ABC/ACPM/ADA/AGS/APhA/ASPC/NLA/PCNA Guideline on the Management of Blood Cholesterol: A Report of the American College of Cardiology/American Heart Association Task Force on Clinical Practice Guidelines. J Am Coll Cardiol. 2019;73(24):3168-3209.  Mahesh  Chandrasekhar   Electronically Signed By: Stanly Leavens M.D. On: 02/07/2024 14:24     ______________________________________________________________________________________________      ECG sinus    I have independently reviewed the above radiologic studies and discussed with the patient   Recent Lab Findings: Lab Results  Component Value Date   WBC 7.3  02/09/2024   HGB 12.8 (L) 02/09/2024   HCT 38.0 (L) 02/09/2024   PLT 148 (L) 02/09/2024   GLUCOSE 87 02/09/2024   CHOL 133 10/07/2021   TRIG 133 10/07/2021   HDL 43 10/07/2021   LDLCALC 67 10/07/2021   ALT 17 02/02/2024   AST 31 02/02/2024   NA 140 02/09/2024   K 4.2 02/09/2024   CL 97 (L) 02/09/2024   CREATININE 1.50 (H) 02/09/2024   BUN 22 02/09/2024   CO2 35 (H) 02/09/2024   TSH 0.592 07/18/2021   INR 1.1 02/09/2024   HGBA1C 5.9 (H) 10/07/2021      Assessment / Plan:   83 y.o. male with severe aortic stenosis.  STS score: 5.854.  NYHA Class IV.  The risks and benefits of transfemoral valve in valve TAVR were discussed in detail.  We also discussed possibility of an emergent sternotomy to address any procedural complications.  Based on our discussion, we collectively decided that an emergent sternotomy would not be indicated.  The patient is agreeable to proceed.  Based on my review of her LHC, echo, and CTA, I agree with the multidisciplinary plan to proceed with a Medtronic valve in valveTAVR.      I  spent 40 minutes counseling the patient face to face.   Linnie MALVA Rayas 02/09/2024 12:57 PM          [1]  Social History Tobacco Use  Smoking Status Former   Current packs/day: 0.00   Types: Cigarettes   Quit date: 1979   Years since quitting: 47.0  Smokeless Tobacco Never  Tobacco Comments   Quit in 1979  [2]  Allergies Allergen Reactions   Atorvastatin Other (See Comments)    aches  myalgia  myalgia    aches  myalgia   Ramipril Other (See Comments) and Cough    cough   "

## 2024-02-09 NOTE — Care Management Important Message (Signed)
 Important Message  Patient Details  Name: Jacob Montes MRN: [id number] Date of Birth: Oct 24, 1940   Important Message Given:  Yes - Medicare IM     [name] Sharps 02/09/2024, 10:58 AM

## 2024-02-10 ENCOUNTER — Inpatient Hospital Stay (HOSPITAL_COMMUNITY)

## 2024-02-10 DIAGNOSIS — I350 Nonrheumatic aortic (valve) stenosis: Secondary | ICD-10-CM | POA: Diagnosis not present

## 2024-02-10 LAB — CBC
HCT: 37.3 % — ABNORMAL LOW (ref 39.0–52.0)
Hemoglobin: 12.9 g/dL — ABNORMAL LOW (ref 13.0–17.0)
MCH: 32.5 pg (ref 26.0–34.0)
MCHC: 34.6 g/dL (ref 30.0–36.0)
MCV: 94 fL (ref 80.0–100.0)
Platelets: 153 K/uL (ref 150–400)
RBC: 3.97 MIL/uL — ABNORMAL LOW (ref 4.22–5.81)
RDW: 14.6 % (ref 11.5–15.5)
WBC: 8 K/uL (ref 4.0–10.5)
nRBC: 0 % (ref 0.0–0.2)

## 2024-02-10 LAB — BASIC METABOLIC PANEL WITH GFR
Anion gap: 10 (ref 5–15)
BUN: 21 mg/dL (ref 8–23)
CO2: 31 mmol/L (ref 22–32)
Calcium: 9.9 mg/dL (ref 8.9–10.3)
Chloride: 100 mmol/L (ref 98–111)
Creatinine, Ser: 1.28 mg/dL — ABNORMAL HIGH (ref 0.61–1.24)
GFR, Estimated: 56 mL/min — ABNORMAL LOW
Glucose, Bld: 115 mg/dL — ABNORMAL HIGH (ref 70–99)
Potassium: 3.9 mmol/L (ref 3.5–5.1)
Sodium: 141 mmol/L (ref 135–145)

## 2024-02-10 MED ORDER — HALOPERIDOL LACTATE 5 MG/ML IJ SOLN
1.0000 mg | Freq: Four times a day (QID) | INTRAMUSCULAR | Status: DC | PRN
  Administered 2024-02-10: 1 mg via INTRAVENOUS
  Filled 2024-02-10: qty 1

## 2024-02-10 MED ORDER — RAMELTEON 8 MG PO TABS
8.0000 mg | ORAL_TABLET | Freq: Every day | ORAL | Status: DC
  Filled 2024-02-10: qty 1

## 2024-02-10 MED ORDER — OLANZAPINE 10 MG IM SOLR
2.5000 mg | Freq: Once | INTRAMUSCULAR | Status: AC | PRN
  Administered 2024-02-10: 2.5 mg via INTRAVENOUS
  Filled 2024-02-10: qty 10

## 2024-02-10 MED ORDER — RAMELTEON 8 MG PO TABS
8.0000 mg | ORAL_TABLET | Freq: Every day | ORAL | Status: DC
  Administered 2024-02-10 – 2024-02-16 (×7): 8 mg via ORAL
  Filled 2024-02-10 (×7): qty 1

## 2024-02-10 MED ORDER — FUROSEMIDE 10 MG/ML IJ SOLN
40.0000 mg | Freq: Once | INTRAMUSCULAR | Status: AC
  Administered 2024-02-10: 40 mg via INTRAVENOUS
  Filled 2024-02-10: qty 4

## 2024-02-10 MED ORDER — MELATONIN 5 MG PO TABS: 5.0000 mg | ORAL_TABLET | Freq: Every day | ORAL | Status: DC

## 2024-02-10 MED ORDER — FUROSEMIDE 20 MG PO TABS
20.0000 mg | ORAL_TABLET | Freq: Two times a day (BID) | ORAL | Status: DC
  Administered 2024-02-10 (×2): 20 mg via ORAL
  Filled 2024-02-10 (×2): qty 1

## 2024-02-10 NOTE — Progress Notes (Addendum)
 Brief Overnight Note  I was paged due to a change in the patient's mental status around 0015. Per his nurse, she initially noticed that the patient was confused around 2200. Upon my arrival, the patient was sitting in the bedside chair stating that he wanted to go home since he did not feel well. He was able to answer all my questions appropriately including the year, place [facility]), and reason for being here in the hospital (heart procedure on Friday). He was able to stand on both legs and support himself with both arms. I did not detect any weakness in his arms or legs or notice a facial droop (although he did not allow me to fully examine him as he said he was fine). I also did not detect any slurred speech. He finally stated that he was agreeable to staying at least until the morning. I do not suspect an acute stroke as the cause of his confusion as he has no focal neurologic deficits at this time. Will defer urgent imaging but will monitor him closely overnight. His symptoms are more consistent with delirium.   Updated: on further re-evaluation, pt had equal grip strength and equal facial movements. He was primarily restless and again able to answer all questions appropriately. Given Haldol x1 for agitation.

## 2024-02-10 NOTE — Progress Notes (Signed)
 The patient is so restless and impulsive. Notified Dr. Orlando to see if he could get anything to relax. Awaiting for response.

## 2024-02-10 NOTE — Progress Notes (Signed)
" ° °  CXR resulted and showed no evidence of PNA but noted to have central vascular congestion and bilateral pleural effusions. He has received PO Lasix  20mg  BID today. Reviewed with Dr. Wendel and will order an additional IV Lasix  40mg  for this evening.   Signed, Laymon CHRISTELLA Qua, PA-C  "

## 2024-02-10 NOTE — Progress Notes (Signed)
"  °  Progress Note  Patient Name: Jacob Montes Date of Encounter: 02/10/2024 [facility] Cardiologist: Evalene Lunger, MD   Interval Summary    Delirium last night; this morning patient remains confused  Per nursing did not get much sleep   Vital Signs Vitals:   02/10/24 0000 02/10/24 0100 02/10/24 0301 02/10/24 0501  BP:   (!) 126/58   Pulse: 70 80 86 96  Resp:   (!) 22   Temp:   98.4 F (36.9 C)   TempSrc:   Oral   SpO2: 95% 95% 98% 95%  Weight:   93.2 kg   Height:        Intake/Output Summary (Last 24 hours) at 02/10/2024 0717 Last data filed at 02/09/2024 1900 Gross per 24 hour  Intake 480 ml  Output 900 ml  Net -420 ml      02/10/2024    3:01 AM 02/09/2024    4:18 AM 02/08/2024    5:00 AM  Last 3 Weights  Weight (lbs) 205 lb 6.4 oz 203 lb 12.8 oz 203 lb 9.6 oz  Weight (kg) 93.169 kg 92.443 kg 92.352 kg      Telemetry/ECG   Sinus Rhythm, PVCs - Personally Reviewed  Physical Exam  GEN: Sleepy Neck: No JVD Cardiac: RRR, 2/6 systolic murmur, no rubs, or gallops.  Respiratory: Clear to auscultation bilaterally. 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     Delirum Confused overnight Hospital medicine consult Will need to reassess later today for mental status clearing as TAV in TAV scheduled tomorrow Acute on chronic diastolic HF NYHA IV now medically stabilized  Due to TAVR valve degeneration Reviewed TAVR CTA, planning 26 Medtronic without cerebral protection (anatomy not  amenable) Ungrafted RCA does not seem to be at jeopardy Looks euvolemic Cr now downtrending Start lasix  20mg  PO BID Bioprosthetic AV degeneration Previous 23 S3 2018 Planning for TAV in TAV Friday 1/2 Discussed with patient and wife at bedside Plan on Eliquis 5mg  BID x 3 months following TAVR procedure with d/c of ASA AKI Resolving CAD s/p CABG Patent LIMA to LAD, VG to OM1 and VG to OM2 cath 12/26 ASA 81 Crestor  40mg  HL Crestor  40mg     For questions or updates, please contact [facility] Please consult [email or web address] for contact info under   Signed, Kalib Bhagat K Jedrek Dinovo, MD        "

## 2024-02-10 NOTE — Progress Notes (Signed)
 Requested for an MD to come an assess patient's situation. Dr. Lea is at bedside assessing the patient. New order for Zypreza 2.5 mg  IV received since the the Haldol  was not effective. Will administer and continue to monitor.

## 2024-02-10 NOTE — Progress Notes (Signed)
 The  patient  is very slow to respond with some confusion going on. He pulled off his leads, 02 and his gown off and he's forgetting to call for help. It's a change noted compared to last night. Notified Dr. Orlando and he said he will be coming to see him soon. Will continue to monitor.

## 2024-02-10 NOTE — Consult Note (Signed)
 Initial Consultation Note   Patient: Jacob Montes FMW:[id number] DOB: Feb 28, 1940 PCP: [name], MD DOA: 02/06/2024 DOS: the patient was seen and examined on 02/10/2024 Primary service: Wendel Lurena POUR, MD  Referring physician: Lurena Wendel Reason for consult: Delirium  Assessment/Plan: Assessment and Plan: 84M h/o CAD status post CABG x 3 in June 2009, severe aortic stenosis status post TAVR in December 2018, hypertension, hyperlipidemia, statin intolerance, stroke, carotid stenosis status post left carotid endarterectomy in 2009 complicated by right laryngeal nerve paralysis, HFpEF, nephrolithiasis, remote tobacco abuse, sleep apnea, and remote PE '12 who presented with progressive heart failure iso TAVR valve degeneration w/ TAVR replacement planned 02/11/2023.  Delirium High suspicion for sundowning; no obvious signs of infection (WBC wnl, UA unremarkable, and CT chest w/o focal consolidations) --Ramelteon  8mg  nightly while admitted --Minimize sedating medications during the day in order to prevent daytime somnolence --Consider PT/OT to increase pre-procedure physical activity and encourage nightly somnolence --Standard delirium management (adapted from NICE guidelines 2011 for prevention of delirium):  -Frequent reorientation to time with: 1) A clock should always be visible. 2)Make sure Calendar/white board is updated.  3) Lights on/blinds open during the day and off/closed at night. 4) Encourage frequent family visits.  -Avoid sleep disturbance (normalize sleep/wake cycle). -Minimize disturbances and consider NOT obtaining vitals at night if possible. -Lights on/blinds open during the day and off/closed at night, place patient near the window. -Monitor for and treat dehydration/constipation. -Optimize oxygen saturation. -Avoid catheters and IV's when possible and look for/treat infections. -Encourage early mobility. -Avoid restraints when possible. -Assess and treat  pain. -Ensure adequate nutrition and functioning dentures. -Address reversible causes of hearing and visual impairment (Use pocket talker if hearing aids are unavailable) -Review Medications to avoid polypharmacy and avoid deliriogenic medications when possible, such as benzodiazepines, dihydropyridines, antihistamines, and anticholinergics.  -NOTE: Signs of delirium may persist for 12 months or longer, particularly in those with underlying dementia. The course of delirium in older medical inpatients: a prospective study. Meredeth DOROTHA PARAS Gen Intern Med. 2003;18(9):696. (Article)   TRH will sign off at present, please call us  again when needed.   HPI: Jacob Montes is a 84 y.o. male with past medical history of CAD status post CABG x 3 in June 2009, severe aortic stenosis status post TAVR in December 2018, hypertension, hyperlipidemia, statin intolerance, stroke, carotid stenosis status post left carotid endarterectomy in 2009 complicated by right laryngeal nerve paralysis, HFpEF, nephrolithiasis, remote tobacco abuse, sleep apnea, and remote PE '12 who presented with progressive heart failure iso TAVR valve degeneration w/ TAVR replacement planned 02/11/2023. SABRA  Review of Systems: As mentioned in the history of present illness. All other systems reviewed and are negative. Past Medical History:  Diagnosis Date   Actinic keratosis    Acute exacerbation of CHF (congestive heart failure) (HCC) 01/19/2024   Aortic stenosis s/p TAVR    a. 01/2017 s/p TAVR - 23mm Sapien; b. 11/2023 Echo: EF 50-55%, GrII DD, nl RV fxn, mild-mod MR, mild Ca2+ MS, Mod AS, AVA 0.84, mean gradient 32 mmHg, mild perivalvular leak; c. 01/2024 Echo: EF 50-55%, no rwma, GrII DD, nl RV fxn, mod dil LA, mildly dil RA, mild MR/MS, sev AS w/ mod perivalvular leak - mean grad , Vmax 4.52 m/s, DVI 0.13.   CAD (coronary artery disease)    a. 07/2007 s/p CABG x 3 ([place], [place]); b. 12/2023 MV: No isch/infarct; c. 01/2024 Cath: LM  95ost/m, LAD 149m, LCX 100ost/p/m, OM3 40, RCA small,  95p, 79m, RPDA fills via collats from LAD. VG->OM1 nl, VG->OM3 nl, LIMA->LAD nl.   Cancer (HCC)    Skin   Carotid artery disease    a. 2009 s/p left sided CEA complicated by RLN parlaysis; b. 02/2023 U/S: no significant dzs.   Cataract    Chronic heart failure with preserved ejection fraction (HFpEF) (HCC)    a. 01/2024 Echo: EF 50-55%, GrII DD, nl RV fxn, mod dil LA, mild MR/MS, severe AS, mod perivalvular leak; b. 01/2024 RHC: RA 3, RV 66/2, PA 55/17 (30), PCWP 20.   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   GERD (gastroesophageal reflux disease)    HLD (hyperlipidemia)    Hyperlipidemia 10/15/2015   Hypertension    Nephrolithiasis 11/16/2018   Pulmonary embolism (HCC) 08/2010   Skin cancer    Sleep apnea    Stroke [facility])    Past Surgical History:  Procedure Laterality Date   CARDIAC CATHETERIZATION     CARDIAC VALVE REPLACEMENT  01-19-17   Carotid surgery Left 03/08/2007   CHOLECYSTECTOMY  02/16/2018   COLONOSCOPY WITH PROPOFOL  N/A 09/01/2022   Procedure: COLONOSCOPY WITH PROPOFOL ;  Surgeon: Maryruth Ole DASEN, MD;  Location: [facility] ENDOSCOPY;  Service: Endoscopy;  Laterality: N/A;   CORONARY ARTERY BYPASS GRAFT     ENDOSCOPIC RETROGRADE CHOLANGIOPANCREATOGRAPHY (ERCP) WITH PROPOFOL  N/A 07/10/2021   Procedure: ENDOSCOPIC RETROGRADE CHOLANGIOPANCREATOGRAPHY (ERCP) WITH PROPOFOL ;  Surgeon: Jinny Carmine, MD;  Location: [facility] ENDOSCOPY;  Service: Endoscopy;  Laterality: N/A;   ERCP N/A 11/18/2018   Procedure: ENDOSCOPIC RETROGRADE CHOLANGIOPANCREATOGRAPHY (ERCP);  Surgeon: Jinny Carmine, MD;  Location: [facility] ENDOSCOPY;  Service: Endoscopy;  Laterality: N/A;   EUS N/A 08/21/2021   Procedure: UPPER ENDOSCOPIC ULTRASOUND (EUS) LINEAR;  Surgeon: Queenie Asberry LABOR, MD;  Location: [facility] ENDOSCOPY;  Service: Gastroenterology;  Laterality: N/A;  [facility]   EYE SURGERY  04-15-99   09-14-99   Heart  Bypass N/A 07/2007   HERNIA REPAIR  03-12-88   Not sure of date   left shoulder surgery Left 06/03/2009   POLYPECTOMY  09/01/2022   Procedure: POLYPECTOMY;  Surgeon: Maryruth Ole DASEN, MD;  Location: [facility] ENDOSCOPY;  Service: Endoscopy;;   RIGHT AND LEFT HEART CATH  07/18/2007   right shoulder surgery  2003   RIGHT/LEFT HEART CATH AND CORONARY/GRAFT ANGIOGRAPHY N/A 02/04/2024   Procedure: RIGHT/LEFT HEART CATH AND CORONARY/GRAFT ANGIOGRAPHY;  Surgeon: Darron Deatrice LABOR, MD;  Location: [facility] INVASIVE CV LAB;  Service: Cardiovascular;  Laterality: N/A;   TEE WITHOUT CARDIOVERSION N/A 11/21/2018   Procedure: TRANSESOPHAGEAL ECHOCARDIOGRAM (TEE);  Surgeon: Darron Deatrice LABOR, MD;  Location: [facility] ORS;  Service: Cardiovascular;  Laterality: N/A;   TVAR     Social History:  reports that he quit smoking about 47 years ago. His smoking use included cigarettes. He has never used smokeless tobacco. He reports that he does not drink alcohol and does not use drugs.  Allergies[1]  Family History  Problem Relation Age of Onset   Hypertension Mother    Stroke Mother    Lung cancer Father    Alcohol abuse Father    Non-Hodgkin's lymphoma Brother    Varicose Veins Brother     Prior to Admission medications  Medication Sig Start Date End Date Taking? Authorizing Provider  acetaminophen  (TYLENOL ) 325 MG tablet Take 2 tablets (650 mg total) by mouth every 6 (six) hours as needed for mild pain (pain score 1-3) or fever (or Fever >/= 101). 02/06/24  Yes Alexander, Natalie, DO  amLODipine  (NORVASC ) 10 MG tablet Take 10 mg by mouth daily.   Yes [provider]  aspirin  81 MG chewable tablet Chew 81 mg by mouth daily.   Yes [provider]  budesonide  (PULMICORT ) 0.25 MG/2ML nebulizer solution Take 2 mLs (0.25 mg total) by nebulization 2 (two) times daily. Patient taking differently: Take 0.25 mg by nebulization 2 (two) times daily as needed (for shortness of breath). 02/06/24  Yes Alexander,  Natalie, DO  docusate sodium  (COLACE) 100 MG capsule Take 100 mg by mouth daily as needed for mild constipation.   Yes [provider]  furosemide  (LASIX ) 10 MG/ML injection Inject 4 mLs (40 mg total) into the vein 2 (two) times daily. 02/06/24  Yes Alexander, Natalie, DO  furosemide  (LASIX ) 40 MG tablet Take 60 mg by mouth daily.   Yes [provider]  metoprolol  succinate (TOPROL -XL) 25 MG 24 hr tablet Take 1 tablet (25 mg total) by mouth daily. Patient taking differently: Take 25 mg by mouth every evening. 02/06/24  Yes Alexander, Natalie, DO  montelukast  (SINGULAIR ) 10 MG tablet Take 1 tablet (10 mg total) by mouth at bedtime as needed (allergies). 05/22/19  Yes [name], MD  Multiple Vitamin (MULTIVITAMIN WITH MINERALS) TABS tablet Take 1 tablet by mouth daily. 02/07/24  Yes Alexander, Natalie, DO  omeprazole (PRILOSEC) 20 MG capsule Take 20 mg by mouth daily.   Yes [provider]  potassium chloride  SA (KLOR-CON  M) 20 MEQ tablet Take 2 tablets (40 mEq total) by mouth 2 (two) times daily. Patient taking differently: Take 20 mEq by mouth daily. 02/06/24  Yes Alexander, Natalie, DO  Probiotic Product (PROBIOTIC PO) Take 1 tablet by mouth daily.   Yes [provider]  rosuvastatin  (CRESTOR ) 20 MG tablet Take 20 mg by mouth daily.   Yes [provider]  heparin  25000 UT/250ML infusion Inject 1,100 Units/hr into the vein continuous. Patient not taking: Reported on 02/06/2024 02/06/24   Alexander, Natalie, DO  ipratropium-albuterol  (DUONEB) 0.5-2.5 (3) MG/3ML SOLN Take 3 mLs by nebulization 2 (two) times daily. Patient not taking: Reported on 02/06/2024 02/06/24   Alexander, Natalie, DO  Morphine  Sulfate (MORPHINE , PF,) 2 MG/ML injection Inject 1 mL (2 mg total) into the vein every 4 (four) hours as needed. Patient not taking: Reported on 02/06/2024 02/06/24   Alexander, Natalie, DO  ondansetron  (ZOFRAN ) 4 MG tablet Take 1 tablet (4 mg total)  by mouth every 6 (six) hours as needed for nausea. Patient not taking: Reported on 02/06/2024 02/06/24   Alexander, Natalie, DO  ondansetron  (ZOFRAN ) 4 MG/2ML SOLN injection Inject 2 mLs (4 mg total) into the vein every 6 (six) hours as needed for nausea. Patient not taking: Reported on 02/06/2024 02/06/24   Alexander, Natalie, DO  oxyCODONE  (OXY IR/ROXICODONE ) 5 MG immediate release tablet Take 1 tablet (5 mg total) by mouth every 4 (four) hours as needed for moderate pain (pain score 4-6). Patient not taking: Reported on 02/06/2024 02/06/24   Alexander, Natalie, DO  pantoprazole  (PROTONIX ) 40 MG tablet Take 1 tablet (40 mg total) by mouth 2 (two) times daily before a meal. Patient not taking: Reported on 02/06/2024 02/06/24   Alexander, Natalie, DO  polyethylene glycol (MIRALAX  / GLYCOLAX ) 17 g packet Take 17 g by mouth daily as needed for mild constipation. Patient not taking: Reported on 02/06/2024 02/06/24   Alexander, Natalie, DO  rosuvastatin  (CRESTOR ) 40 MG tablet Take 1 tablet (40 mg total) by mouth daily. Patient not taking: Reported on 02/06/2024  02/06/24   Alexander, Natalie, DO  sodium chloride  flush (NS) 0.9 % SOLN Inject 3 mLs into the vein every 12 (twelve) hours. 02/06/24   Alexander, Natalie, DO  sodium chloride  flush (NS) 0.9 % SOLN Inject 3 mLs into the vein as needed. 02/06/24   Marsa Edelman, DO    Physical Exam: Vitals:   02/10/24 0000 02/10/24 0100 02/10/24 0301 02/10/24 0501  BP:   (!) 126/58   Pulse: 70 80 86 96  Resp:   (!) 22   Temp:   98.4 F (36.9 C)   TempSrc:   Oral   SpO2: 95% 95% 98% 95%  Weight:   93.2 kg   Height:       General: Alert, oriented x3, resting comfortably in no acute distress HEENT: EOMI, oropharynx clear, moist mucous membranes, hearing intact Neck: Trachea midline and no gross thyromegaly Respiratory: Lungs clear to auscultation bilaterally with normal respiratory effort; no w/r/r Cardiovascular: Regular rate and rhythm w/o  m/r/g Abdomen: Soft, nontender, nondistended. Positive bowel sounds MSK: No obvious joint deformities or swelling Skin: No obvious rashes or lesions Neurologic: Awake, alert, spontaneously moves all extremities, strength intact Psychiatric: Appropriate mood and affect, conversational and cooperative   Data Reviewed:   There are no new results to review at this time.    Family Communication: N/A Primary team communication: N/A Thank you very much for involving us  in the care of your patient.   ------- I spent 50 minutes reviewing previous notes, at the bedside counseling/discussing the treatment plan, and performing clinical documentation.  Author: Marsha Ada, MD 02/10/2024 7:55 AM  For on call review [email or web address].      [1]  Allergies Allergen Reactions   Atorvastatin Other (See Comments)    aches  myalgia  myalgia    aches  myalgia   Ramipril Other (See Comments) and Cough    cough

## 2024-02-10 NOTE — Plan of Care (Signed)
" °  Problem: Elimination: Goal: Will not experience complications related to urinary retention Outcome: Progressing   Problem: Pain Managment: Goal: General experience of comfort will improve and/or be controlled Outcome: Progressing   Problem: Education: Goal: Knowledge of General Education information will improve Description: Including pain rating scale, medication(s)/side effects and non-pharmacologic comfort measures Outcome: Not Progressing   Problem: Clinical Measurements: Goal: Respiratory complications will improve Outcome: Not Progressing   Problem: Coping: Goal: Level of anxiety will decrease Outcome: Not Progressing   "

## 2024-02-10 NOTE — Progress Notes (Signed)
 Patient tries  to walk with his eyes closed. He's very impulsive. Notified Dr. Orlando for an order for a safety sitter and received. Will continue to monitor.

## 2024-02-10 NOTE — Progress Notes (Signed)
 The patient is super agitated, impulsive and had pulled his telemetry and 02 off. He's so restless and can't be still. Notified Dr. Sundil and received a new order for Haldol 1 mg IV q 6 for agitation.

## 2024-02-11 ENCOUNTER — Encounter (HOSPITAL_COMMUNITY): Payer: Self-pay | Admitting: Certified Registered Nurse Anesthetist

## 2024-02-11 ENCOUNTER — Encounter (HOSPITAL_COMMUNITY)
Admission: EM | Disposition: A | Discharge status: Skilled Nursing Facility | Payer: Self-pay | Source: Other Acute Inpatient Hospital | Attending: Internal Medicine

## 2024-02-11 ENCOUNTER — Ambulatory Visit: Admitting: Family

## 2024-02-11 ENCOUNTER — Inpatient Hospital Stay (HOSPITAL_COMMUNITY)

## 2024-02-11 DIAGNOSIS — I350 Nonrheumatic aortic (valve) stenosis: Secondary | ICD-10-CM | POA: Diagnosis not present

## 2024-02-11 DIAGNOSIS — R410 Disorientation, unspecified: Secondary | ICD-10-CM

## 2024-02-11 DIAGNOSIS — J690 Pneumonitis due to inhalation of food and vomit: Secondary | ICD-10-CM | POA: Diagnosis not present

## 2024-02-11 LAB — PRO BRAIN NATRIURETIC PEPTIDE: Pro Brain Natriuretic Peptide: 4279 pg/mL — ABNORMAL HIGH

## 2024-02-11 LAB — BASIC METABOLIC PANEL WITH GFR
Anion gap: 14 (ref 5–15)
BUN: 22 mg/dL (ref 8–23)
CO2: 27 mmol/L (ref 22–32)
Calcium: 10 mg/dL (ref 8.9–10.3)
Chloride: 99 mmol/L (ref 98–111)
Creatinine, Ser: 1.41 mg/dL — ABNORMAL HIGH (ref 0.61–1.24)
GFR, Estimated: 49 mL/min — ABNORMAL LOW
Glucose, Bld: 111 mg/dL — ABNORMAL HIGH (ref 70–99)
Potassium: 3.6 mmol/L (ref 3.5–5.1)
Sodium: 140 mmol/L (ref 135–145)

## 2024-02-11 LAB — CBC
HCT: 37 % — ABNORMAL LOW (ref 39.0–52.0)
Hemoglobin: 12.5 g/dL — ABNORMAL LOW (ref 13.0–17.0)
MCH: 32.2 pg (ref 26.0–34.0)
MCHC: 33.8 g/dL (ref 30.0–36.0)
MCV: 95.4 fL (ref 80.0–100.0)
Platelets: 167 K/uL (ref 150–400)
RBC: 3.88 MIL/uL — ABNORMAL LOW (ref 4.22–5.81)
RDW: 14.6 % (ref 11.5–15.5)
WBC: 10.1 K/uL (ref 4.0–10.5)
nRBC: 0 % (ref 0.0–0.2)

## 2024-02-11 LAB — MAGNESIUM: Magnesium: 1.7 mg/dL (ref 1.7–2.4)

## 2024-02-11 SURGERY — TRANSCATHETER AORTIC VALVE REPLACEMENT, TRANSFEMORAL (CATHLAB)
Anesthesia: Monitor Anesthesia Care | Laterality: Right

## 2024-02-11 MED ORDER — FUROSEMIDE 10 MG/ML IJ SOLN: 20.0000 mg | Freq: Two times a day (BID) | INTRAMUSCULAR | Status: DC

## 2024-02-11 MED ORDER — OLANZAPINE 10 MG IM SOLR
2.5000 mg | Freq: Once | INTRAMUSCULAR | Status: DC
  Filled 2024-02-11: qty 10

## 2024-02-11 MED ORDER — MAGNESIUM SULFATE 4 GM/100ML IV SOLN
4.0000 g | Freq: Once | INTRAVENOUS | Status: AC
  Administered 2024-02-11: 4 g via INTRAVENOUS
  Filled 2024-02-11: qty 100

## 2024-02-11 MED ORDER — POTASSIUM CHLORIDE 10 MEQ/100ML IV SOLN: 10.0000 meq | INTRAVENOUS | Status: DC

## 2024-02-11 MED ORDER — OLANZAPINE 10 MG IM SOLR
2.5000 mg | Freq: Once | INTRAMUSCULAR | Status: AC | PRN
  Administered 2024-02-11: 2.5 mg via INTRAMUSCULAR

## 2024-02-11 MED ORDER — STERILE WATER FOR INJECTION IJ SOLN
INTRAMUSCULAR | Status: AC
  Filled 2024-02-11: qty 10

## 2024-02-11 NOTE — Progress Notes (Signed)
 Dr. Lea is at the bedside assessing the patient.

## 2024-02-11 NOTE — Progress Notes (Addendum)
"  °  Progress Note  Patient Name: Jacob Montes Date of Encounter: 02/11/2024 [facility] Cardiologist: Evalene Lunger, MD   Interval Summary    Delirious again overnight > required Haldol and restraints  Vital Signs Vitals:   02/10/24 2322 02/11/24 0338 02/11/24 0436 02/11/24 0512  BP:  131/64  (!) 118/54  Pulse: 93 (!) 114 (!) 105 97  Resp:      Temp:  97.8 F (36.6 C)  97.9 F (36.6 C)  TempSrc:  Axillary  Axillary  SpO2: 90% (!) 83% 98% 100%  Weight:    92.4 kg  Height:        Intake/Output Summary (Last 24 hours) at 02/11/2024 9386 Last data filed at 02/10/2024 1453 Gross per 24 hour  Intake 0 ml  Output 500 ml  Net -500 ml      02/11/2024    5:12 AM 02/10/2024    3:01 AM 02/09/2024    4:18 AM  Last 3 Weights  Weight (lbs) 203 lb 11.3 oz 205 lb 6.4 oz 203 lb 12.8 oz  Weight (kg) 92.4 kg 93.169 kg 92.443 kg      Telemetry/ECG   Sinus Rhythm, PVCs - Personally Reviewed  Physical Exam  GEN: Sleepy but arousable Neck: No JVD Cardiac: RRR, 2/6 systolic murmur, no rubs, or gallops.  Respiratory: Mild rales R base GI: Soft, nontender, non-distended  MS: 2+ bilateral LE edema  Assessment & Plan   84 year old male with a history of CAD status post CABG x 3 in June 2009, severe aortic stenosis status post TAVR in December 2018, hypertension, hyperlipidemia, statin intolerance, stroke, carotid stenosis status post left carotid endarterectomy in 2009 complicated by right laryngeal nerve paralysis, HFpEF, nephrolithiasis, remote tobacco abuse, sleep apnea, and remote PE '12 who presented with progressive heart failure.     Delirum/agitation Hospital medicine follwoing UA negative CXR without infiltrate 1/1 Repeat CXR today pending On PRN Haldol for severe agitation Patient has been hospitalized for many days since November This will need to clear prior to TAV in TAV procedure>>cancel for today Will see if can be done 1/6 is sensorium clears Acute on chronic  diastolic HF NYHA IV now medically stabilized  Due to TAVR valve degeneration Reviewed TAVR CTA, planning 26 [employer] without cerebral protection (anatomy not amenable) Ungrafted RCA does not seem to be in jeopardy Cr up to 1.4 Hold further lasix  for now, seems relatively euvolemic 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

## 2024-02-11 NOTE — TOC Progression Note (Signed)
 Transition of Care [facility]) - Progression Note    Patient Details  Name: Jacob Montes MRN: [id number] Date of Birth: 28-Jan-1941  Transition of [facility]) CM/SW Contact  Graves-Bigelow, Erminio Deems, RN Phone Number: 02/11/2024, 12:38 PM  Clinical Narrative: Patient was discussed in progression rounds- TAVR postponed due to delirium. ICM will continue to follow for additional disposition needs as the patient progresses.   Expected Discharge Plan: Home/Self Care Barriers to Discharge: Continued Medical Work up  Expected Discharge Plan and Services   Discharge Planning Services: CM Consult Post Acute Care Choice: NA Living arrangements for the past 2 months: Single Family Home                   DME Agency: NA       HH Arranged: NA   Social Drivers of Health (SDOH) Interventions SDOH Screenings   Food Insecurity: No Food Insecurity (02/06/2024)  Housing: Low Risk (02/06/2024)  Transportation Needs: No Transportation Needs (02/06/2024)  Utilities: Not At Risk (02/06/2024)  Alcohol Screen: Low Risk (07/03/2021)  Depression (PHQ2-9): Low Risk (10/23/2021)  Financial Resource Strain: Low Risk  (12/02/2023)   Received from [facility]  Physical Activity: Inactive (12/02/2023)   Received from [facility]  Social Connections: Socially Integrated (02/06/2024)  Stress: No Stress Concern Present (11/05/2023)   Received from [facility]  Tobacco Use: Medium Risk (02/07/2024)  Health Literacy: Adequate Health Literacy (11/05/2023)   Received from [facility]    Readmission Risk Interventions     No data to display

## 2024-02-11 NOTE — Progress Notes (Signed)
 Pt's O2 sat low 80'S, switched from [place] to NRB. RRT & [facility] on call provider notified. RRT at bedside

## 2024-02-11 NOTE — Progress Notes (Signed)
 This RN returned from breat and I was told patient  02 sat dropped to the lower 80s. RT was called and he placed him on NRB. He is  now on HFNC at [facility].  X-ray needed since he's diminished on the right side per RT. Paged Dr. Nasir and received an order for it.

## 2024-02-11 NOTE — Plan of Care (Signed)
" °  Problem: Elimination: Goal: Will not experience complications related to urinary retention Outcome: Progressing   Problem: Pain Managment: Goal: General experience of comfort will improve and/or be controlled Outcome: Progressing   Problem: Education: Goal: Knowledge of General Education information will improve Description: Including pain rating scale, medication(s)/side effects and non-pharmacologic comfort measures Outcome: Not Progressing   Problem: Clinical Measurements: Goal: Ability to maintain clinical measurements within normal limits will improve Outcome: Not Progressing Goal: Respiratory complications will improve Outcome: Not Progressing   Problem: Activity: Goal: Risk for activity intolerance will decrease Outcome: Not Progressing   Problem: Activity: Goal: Ability to tolerate increased activity will improve Outcome: Not Progressing   Problem: Safety: Goal: Non-violent Restraint(s) Outcome: Not Progressing   "

## 2024-02-11 NOTE — Progress Notes (Signed)
 The patient's wife and son just came in to the hospital. They were updated on all the things that went on this shift including him being on 4 points restraints. They're in agreement to the plan of care. They left to go get something to eat and will be back to talk to the surgeon.

## 2024-02-11 NOTE — Progress Notes (Signed)
 The patient is wheezing. He received PRN breathing treatment. He is climbing out of bed right now and super agitated. He wont keep his 02 on and is desating. Patient tries to hit staff when we touch him. Paged Dr. Lea and Rapid RN  and they are  at the bedside. I requested for patient to be transferred to the ICU  New order for 4 point restraint received instead.

## 2024-02-11 NOTE — Progress Notes (Signed)
 "  [facility] PROGRESS NOTE   Jacob Montes FMW:[id number] DOB: [birth year] DOA: 02/06/2024  PCP: [name], MD  Brief History: 31M h/o CAD status post CABG x 3 in June 2009, severe aortic stenosis status post TAVR in December 2018, hypertension, hyperlipidemia, statin intolerance, stroke, carotid stenosis status post left carotid endarterectomy in 2009 complicated by right laryngeal nerve paralysis, HFpEF, nephrolithiasis, remote tobacco abuse, sleep apnea, and remote PE who presented with progressive heart failure iso TAVR valve degeneration w/ TAVR replacement planned 02/11/2023.  Patient noted to be delirious.  Hospital medicine was subsequently consulted for assistance.  Consultants: Cardiology.  Procedures: None yet    Subjective/Interval History: Overnight events noted.  Patient has significant episodes of agitation.  Patient was given Haldol and Zyprexa overnight. Noted to be asleep this morning.  His wife and son are at the bedside.  Since patient had quite eventful night he was not woken up this morning.    Assessment/Plan:  Acute delirium Based on review of previous records it appears that patient has been in the hospital on and off since December 10.  It appears that he was doing well up until 1/1 when he was noted to have mental status changes.  He was noted to be confused and wanted to go home.  No focal neurological deficits were noted at that time.  No reports of falls or injuries. His presentation is consistent with acute delirium in the setting of hospital stay. He is requiring restraints as he was pulling on his telemetry leads and his IV. Patient noted to be on Haldol every 6 hours as needed.  Patient was given a dose of Zyprexa at 10:30 PM last night.  Additional dose ordered at 3:15 AM this morning but not given yet. Patient was also started on Rozerem yesterday which was given last night. Discussed in detail with patient's wife and son.  Would prefer  to use nonpharmacological measures to help with his delirium.  They were asked to communicate as much as possible with the patient when he is awake.  Keeping the blinds open in the room.  Exposure to daylight and frequent reorientation will help. Mobility may also help.  Will request PT and OT to see the patient. They were told that delirium will take a very long time to get better.  Acute on chronic diastolic CHF LVEF was noted to be 50 to 55%. Patient was diuresed.  Volume seems to have improved.  Cardiology is following and managing.  Not noted to be on scheduled diuretics at this time.  He was given a dose of furosemide  yesterday evening. Creatinine noted to be stable.  Potassium is 3.6 and will be supplemented.  Magnesium  is 1.7.  Bioprosthetic AV degeneration Plan was for TAVR procedure today however due to delirium this will need to be postponed.  Defer to cardiology.  Acute kidney injury Baseline renal function appears to be normal.  Creatinine was 0.8 early December.  Creatinine has been creeping up the last few days.  Noted to be 1.41 today.  Continue to monitor closely.  Avoid nephrotoxic agents.  Monitor urine output.  Coronary artery disease status post CABG Stable.  Patient noted to be on aspirin  and beta-blocker.  Also on statin.  History of asthma Stable.  No wheezing heard this morning.    Normocytic anemia Stable.  No evidence of overt bleeding.  Obesity Estimated body mass index is 32.88 kg/m as calculated from the following:   Height as of  this encounter: 5' 6 (1.676 m).   Weight as of this encounter: 92.4 kg.   DVT Prophylaxis: Lovenox  Code Status: Full code Family Communication: Discussed with patient's wife and son Disposition Plan: To be determined     Medications: Scheduled:  aspirin  EC  81 mg Oral Daily   budesonide  (PULMICORT ) nebulizer solution  0.25 mg Nebulization BID   enoxaparin  (LOVENOX ) injection  40 mg Subcutaneous Daily   magnesium   sulfate  40 mEq Other To OR   metoprolol  succinate  25 mg Oral Daily   multivitamin with minerals  1 tablet Oral Daily   OLANZapine  2.5 mg Intramuscular Once   pantoprazole   40 mg Oral Q0600   potassium chloride   80 mEq Other To OR   potassium chloride   40 mEq Oral Daily   ramelteon  8 mg Oral QHS   rosuvastatin   40 mg Oral Daily   sterile water  (preservative free)       Continuous:   ceFAZolin (ANCEF) IV Stopped (02/11/24 0828)   dexmedetomidine  Stopped (02/11/24 0829)   heparin  30,000 units/NS 1000 mL solution for CELLSAVER Stopped (02/11/24 0829)   norepinephrine (LEVOPHED) Adult infusion Stopped (02/11/24 0829)   PRN:acetaminophen , haloperidol lactate, ipratropium-albuterol , montelukast , nitroGLYCERIN , ondansetron  (ZOFRAN ) IV, polyethylene glycol, sterile water  (preservative free)  Antibiotics: Anti-infectives (From admission, onward)    Start     Dose/Rate Route Frequency Ordered Stop   02/11/24 0400  ceFAZolin (ANCEF) IVPB 2g/100 mL premix        2 g 200 mL/hr over 30 Minutes Intravenous To Surgery 02/09/24 0947 02/12/24 0400       Objective:  Vital Signs  Vitals:   02/11/24 0512 02/11/24 0600 02/11/24 0820 02/11/24 1009  BP: (!) 118/54  113/65 (!) 121/56  Pulse: 97 (!) 109 93 97  Resp:   20   Temp: 97.9 F (36.6 C)     TempSrc: Axillary  Axillary   SpO2: 100% 99% 97%   Weight: 92.4 kg     Height:        Intake/Output Summary (Last 24 hours) at 02/11/2024 1030 Last data filed at 02/11/2024 0600 Gross per 24 hour  Intake --  Output 1000 ml  Net -1000 ml   Filed Weights   02/09/24 0418 02/10/24 0301 02/11/24 0512  Weight: 92.4 kg 93.2 kg 92.4 kg    General appearance: Noted to be somnolent Resp: Clear to auscultation bilaterally.  Normal effort Cardio: S1-S2 is normal regular.  No S3-S4.  No rubs murmurs or bruit GI: Abdomen is soft.  Nontender nondistended.  Bowel sounds are present normal.  No masses organomegaly Extremities: Moving his  extremities. Neurological assessment not done at this time as patient is asleep.  Lab Results:  Data Reviewed: I have personally reviewed following labs and reports of the imaging studies  CBC: Recent Labs  Lab 02/07/24 0453 02/08/24 0546 02/09/24 0604 02/10/24 0611 02/11/24 0415  WBC 7.4 6.5 7.3 8.0 10.1  HGB 13.1 12.1* 12.8* 12.9* 12.5*  HCT 38.5* 35.5* 38.0* 37.3* 37.0*  MCV 95.5 95.4 96.9 94.0 95.4  PLT 136* 121* 148* 153 167    Basic Metabolic Panel: Recent Labs  Lab 02/07/24 0453 02/08/24 0546 02/09/24 0604 02/10/24 0611 02/11/24 0414 02/11/24 0415  NA 141 140 140 141  --  140  K 4.1 3.5 4.2 3.9  --  3.6  CL 97* 97* 97* 100  --  99  CO2 35* 35* 35* 31  --  27  GLUCOSE 137* 123* 87 115*  --  111*  BUN 21 22 22 21   --  22  CREATININE 1.32* 1.34* 1.50* 1.28*  --  1.41*  CALCIUM  9.9 9.5 9.7 9.9  --  10.0  MG  --   --   --   --  1.7  --     GFR: Estimated Creatinine Clearance: 42.2 mL/min (A) (by C-G formula based on SCr of 1.41 mg/dL (H)).  Coagulation Profile: Recent Labs  Lab 02/09/24 1047  INR 1.1    BNP (last 3 results) Recent Labs    01/28/24 0930 02/01/24 1042 02/11/24 0414  PROBNP 1,299.0* 2,213.0* 4,279.0*     Recent Results (from the past 240 hours)  Resp panel by RT-PCR (RSV, Flu A&B, Covid) Anterior Nasal Swab     Status: None   Collection Time: 02/01/24 11:56 AM   Specimen: Anterior Nasal Swab  Result Value Ref Range Status   SARS Coronavirus 2 by RT PCR NEGATIVE NEGATIVE Final    Comment: (NOTE) SARS-CoV-2 target nucleic acids are NOT DETECTED.  The SARS-CoV-2 RNA is generally detectable in upper respiratory specimens during the acute phase of infection. The lowest concentration of SARS-CoV-2 viral copies this assay can detect is 138 copies/mL. A negative result does not preclude SARS-Cov-2 infection and should not be used as the sole basis for treatment or other patient management decisions. A negative result may occur with   improper specimen collection/handling, submission of specimen other than nasopharyngeal swab, presence of viral mutation(s) within the areas targeted by this assay, and inadequate number of viral copies(<138 copies/mL). A negative result must be combined with clinical observations, patient history, and epidemiological information. The expected result is Negative.  Fact Sheet for Patients:  bloggercourse.com  Fact Sheet for Healthcare Providers:  seriousbroker.it  This test is no t yet approved or cleared by the [place]  FDA and  has been authorized for detection and/or diagnosis of SARS-CoV-2 by FDA under an Emergency Use Authorization (EUA). This EUA will remain  in effect (meaning this test can be used) for the duration of the COVID-19 declaration under Section 564(b)(1) of the Act, 21 U.S.C.section 360bbb-3(b)(1), unless the authorization is terminated  or revoked sooner.       Influenza A by PCR NEGATIVE NEGATIVE Final   Influenza B by PCR NEGATIVE NEGATIVE Final    Comment: (NOTE) The Xpert Xpress SARS-CoV-2/FLU/RSV plus assay is intended as an aid in the diagnosis of influenza from Nasopharyngeal swab specimens and should not be used as a sole basis for treatment. Nasal washings and aspirates are unacceptable for Xpert Xpress SARS-CoV-2/FLU/RSV testing.  Fact Sheet for Patients: bloggercourse.com  Fact Sheet for Healthcare Providers: seriousbroker.it  This test is not yet approved or cleared by the [place]  FDA and has been authorized for detection and/or diagnosis of SARS-CoV-2 by FDA under an Emergency Use Authorization (EUA). This EUA will remain in effect (meaning this test can be used) for the duration of the COVID-19 declaration under Section 564(b)(1) of the Act, 21 U.S.C. section 360bbb-3(b)(1), unless the authorization is terminated or revoked.      Resp Syncytial Virus by PCR NEGATIVE NEGATIVE Final    Comment: (NOTE) Fact Sheet for Patients: bloggercourse.com  Fact Sheet for Healthcare Providers: seriousbroker.it  This test is not yet approved or cleared by the [place]  FDA and has been authorized for detection and/or diagnosis of SARS-CoV-2 by FDA under an Emergency Use Authorization (EUA). This EUA will remain in effect (meaning this test can be used) for the duration of  the COVID-19 declaration under Section 564(b)(1) of the Act, 21 U.S.C. section 360bbb-3(b)(1), unless the authorization is terminated or revoked.  Performed at [facility], [address] [place], [place] [address]   Resp panel by RT-PCR (RSV, Flu A&B, Covid) Anterior Nasal Swab     Status: None   Collection Time: 02/03/24 12:25 PM   Specimen: Anterior Nasal Swab  Result Value Ref Range Status   SARS Coronavirus 2 by RT PCR NEGATIVE NEGATIVE Final    Comment: (NOTE) SARS-CoV-2 target nucleic acids are NOT DETECTED.  The SARS-CoV-2 RNA is generally detectable in upper respiratory specimens during the acute phase of infection. The lowest concentration of SARS-CoV-2 viral copies this assay can detect is 138 copies/mL. A negative result does not preclude SARS-Cov-2 infection and should not be used as the sole basis for treatment or other patient management decisions. A negative result may occur with  improper specimen collection/handling, submission of specimen other than nasopharyngeal swab, presence of viral mutation(s) within the areas targeted by this assay, and inadequate number of viral copies(<138 copies/mL). A negative result must be combined with clinical observations, patient history, and epidemiological information. The expected result is Negative.  Fact Sheet for Patients:  bloggercourse.com  Fact Sheet for Healthcare Providers:   seriousbroker.it  This test is no t yet approved or cleared by the [place]  FDA and  has been authorized for detection and/or diagnosis of SARS-CoV-2 by FDA under an Emergency Use Authorization (EUA). This EUA will remain  in effect (meaning this test can be used) for the duration of the COVID-19 declaration under Section 564(b)(1) of the Act, 21 U.S.C.section 360bbb-3(b)(1), unless the authorization is terminated  or revoked sooner.       Influenza A by PCR NEGATIVE NEGATIVE Final   Influenza B by PCR NEGATIVE NEGATIVE Final    Comment: (NOTE) The Xpert Xpress SARS-CoV-2/FLU/RSV plus assay is intended as an aid in the diagnosis of influenza from Nasopharyngeal swab specimens and should not be used as a sole basis for treatment. Nasal washings and aspirates are unacceptable for Xpert Xpress SARS-CoV-2/FLU/RSV testing.  Fact Sheet for Patients: bloggercourse.com  Fact Sheet for Healthcare Providers: seriousbroker.it  This test is not yet approved or cleared by the [place]  FDA and has been authorized for detection and/or diagnosis of SARS-CoV-2 by FDA under an Emergency Use Authorization (EUA). This EUA will remain in effect (meaning this test can be used) for the duration of the COVID-19 declaration under Section 564(b)(1) of the Act, 21 U.S.C. section 360bbb-3(b)(1), unless the authorization is terminated or revoked.     Resp Syncytial Virus by PCR NEGATIVE NEGATIVE Final    Comment: (NOTE) Fact Sheet for Patients: bloggercourse.com  Fact Sheet for Healthcare Providers: seriousbroker.it  This test is not yet approved or cleared by the [place]  FDA and has been authorized for detection and/or diagnosis of SARS-CoV-2 by FDA under an Emergency Use Authorization (EUA). This EUA will remain in effect (meaning this test can be used) for  the duration of the COVID-19 declaration under Section 564(b)(1) of the Act, 21 U.S.C. section 360bbb-3(b)(1), unless the authorization is terminated or revoked.  Performed at [facility], [address] [place], [place] [address]   Surgical pcr screen     Status: None   Collection Time: 02/09/24  7:21 PM   Specimen: Nasal Mucosa; Nasal Swab  Result Value Ref Range Status   MRSA, PCR NEGATIVE NEGATIVE Final   Staphylococcus aureus NEGATIVE NEGATIVE Final  Comment: (NOTE) The Xpert SA Assay (FDA approved for NASAL specimens in patients 66 years of age and older), is one component of a comprehensive surveillance program. It is not intended to diagnose infection nor to guide or monitor treatment. Performed at [facility], [address] [place], [place] [address]       Radiology Studies: DG CHEST PORT 1 VIEW Result Date: 02/11/2024 EXAM: 1 VIEW(S) XRAY OF THE CHEST 02/11/2024 06:36:15 AM COMPARISON: 02/10/2024 CLINICAL HISTORY: Wheezing 10028; 200808 Hypoxia 200808 FINDINGS: LUNGS AND PLEURA: Mild pulmonary edema. Small right pleural effusion, slightly increased. Trace left pleural effusion, unchanged. Stable right basilar opacities. No pneumothorax. HEART AND MEDIASTINUM: Cardiomegaly, unchanged. Status post aortic valve replacement. BONES AND SOFT TISSUES: Status post median sternotomy. No acute osseous abnormality. IMPRESSION: 1. Mild pulmonary edema. 2. Small right pleural effusion, slightly increased. 3. Trace left pleural effusion, unchanged. 4. Stable right basilar opacities. Electronically signed by: Waddell Calk MD 02/11/2024 06:53 AM EST RP Workstation: [id number]   DG CHEST PORT 1 VIEW Result Date: 02/10/2024 CLINICAL DATA:  Preop chest exam. EXAM: PORTABLE CHEST 1 VIEW COMPARISON:  Chest CT 02/07/2024 FINDINGS: Status post median sternotomy, TAVR. The lower most sternal wires are broken. The heart is enlarged. Bilateral pleural effusions with bibasilar  atelectasis. Central vascular congestion. No pneumothorax. IMPRESSION: 1. Cardiomegaly with central vascular congestion. 2. Bilateral pleural effusions with bibasilar atelectasis. Electronically Signed   By: Andrea Gasman M.D.   On: 02/10/2024 17:37       LOS: 5 days   Jacob Montes  [facility] Pager on [email or web address]  02/11/2024, 10:30 AM   "

## 2024-02-11 NOTE — Evaluation (Signed)
 Physical Therapy Evaluation Patient Details Name: Jacob Montes MRN: [id number] DOB: 06-19-1940 Today's Date: 02/11/2024  History of Present Illness  Pt is an 84 y.o. M presenting to [facility] on [facility]24 with progressive HF with planned TAVR placement. PMH is significant for CAD, CABG x3 ('09), severe aortic stenosis (TAVR '18), stroke, HTN, and HLD.  Clinical Impression  Unable to obtain prior level of function secondary to impaired cognition. Pt presents to evaluation with deficits in mobility, strength, power, activity tolerance, and balance. Pt requires moderate physical assistance for all mobility tasks, completing sit to stand from EOB with AD and moderate physical assistance. Unable to progress due to impaired command following and difficulty keeping eyes open. PT will continue to treat pt while he is admitted. Given current level of assistance needed, pt would benefit from skilled rehab < 3 hours per day to follow up at discharge.         If plan is discharge home, recommend the following: A lot of help with walking and/or transfers;A lot of help with bathing/dressing/bathroom;Assistance with cooking/housework;Direct supervision/assist for medications management;Direct supervision/assist for financial management;Assist for transportation   Can travel by private vehicle   No    Equipment Recommendations Other (comment) (TBD)  Recommendations for Other Services       Functional Status Assessment Patient has had a recent decline in their functional status and demonstrates the ability to make significant improvements in function in a reasonable and predictable amount of time.     Precautions / Restrictions Precautions Precautions: Fall Recall of Precautions/Restrictions: Impaired Precaution/Restrictions Comments: pt attempting to stand without prompting from therapist multiple times. Restrictions Weight Bearing Restrictions Per Provider Order: No      Mobility  Bed Mobility Overal  bed mobility: Needs Assistance Bed Mobility: Supine to Sit, Sit to Supine, Rolling Rolling: Max assist   Supine to sit: Mod assist, HOB elevated Sit to supine: Mod assist   General bed mobility comments: Supine to sit on the L side. Pt able to initiate movement of BLEs towards EOB but requires physical assistance for getting them fully off of bed, and physical assistance for trunk management. Sit to supine, pt requires physical assistance for BLE management. VC given for sequencing; increased time to complete.    Transfers Overall transfer level: Needs assistance Equipment used: Rolling walker (2 wheels) Transfers: Sit to/from Stand Sit to Stand: Mod assist           General transfer comment: Pt completed multiple STS from EOB with RW and moderate physical assistance for initial power-up. Pt demonstrates signficiant knee flexion in bilateral LEs, with one instance of R knee buckling requiring a knee block for improved stability and to remain upright. Unable to progress due to increased difficulty following commands.    Ambulation/Gait                  Stairs            Wheelchair Mobility     Tilt Bed    Modified Rankin (Stroke Patients Only)       Balance Overall balance assessment: Needs assistance Sitting-balance support: Bilateral upper extremity supported, Feet supported Sitting balance-Leahy Scale: Poor Sitting balance - Comments: Pt tolerated sitting EOB ~4 mins, requiring BUE supported on bed to maintain seated upright.   Standing balance support: Bilateral upper extremity supported, During functional activity, Reliant on assistive device for balance Standing balance-Leahy Scale: Poor Standing balance comment: reliant on external support of device and therapist to maintain upright  Pertinent Vitals/Pain Pain Assessment Pain Assessment: Faces Faces Pain Scale: No hurt Pain Intervention(s): Monitored during  session    Home Living                     Additional Comments: unable to get home setup this session as pt is having difficulty pronunciating words and responding to questions    Prior Function Prior Level of Function : Patient poor historian/Family not available                     Extremity/Trunk Assessment   Upper Extremity Assessment Upper Extremity Assessment: Generalized weakness    Lower Extremity Assessment Lower Extremity Assessment: Generalized weakness    Cervical / Trunk Assessment Cervical / Trunk Assessment: Kyphotic  Communication   Communication Communication: Impaired Factors Affecting Communication: Hearing impaired;Reduced clarity of speech    Cognition Arousal: Lethargic Behavior During Therapy: Flat affect   PT - Cognitive impairments: History of cognitive impairments, Orientation, Awareness, Memory, Attention, Initiation, Sequencing, Problem solving, Safety/Judgement   Orientation impairments: Place, Situation                   PT - Cognition Comments: Pt with increased difficulty responding to questions, demonstrating reduced clarity of speech and requires increased time to process. Pt with impaired orientation to place and situation, requiring prompting from therapist. Pt also having difficulty opening eyes during session, keeping them closed despite cueing. Following commands: Impaired Following commands impaired: Follows one step commands inconsistently     Cueing Cueing Techniques: Verbal cues, Gestural cues     General Comments General comments (skin integrity, edema, etc.): 5L HFNC 97%, HR 71 bpm    Exercises     Assessment/Plan    PT Assessment Patient needs continued PT services  PT Problem List Decreased strength;Decreased activity tolerance;Decreased balance;Decreased mobility;Decreased cognition;Decreased coordination;Decreased knowledge of use of DME;Decreased safety awareness       PT Treatment  Interventions DME instruction;Gait training;Functional mobility training;Therapeutic activities;Therapeutic exercise;Balance training;Cognitive remediation;Patient/family education;Wheelchair mobility training;Manual techniques;Modalities    PT Goals (Current goals can be found in the Care Plan section)  Acute Rehab PT Goals Patient Stated Goal: unable to state at this time PT Goal Formulation: With patient Time For Goal Achievement: 02/25/24 Potential to Achieve Goals: Fair    Frequency Min 2X/week     Co-evaluation               AM-PAC PT 6 Clicks Mobility  Outcome Measure Help needed turning from your back to your side while in a flat bed without using bedrails?: A Lot Help needed moving from lying on your back to sitting on the side of a flat bed without using bedrails?: A Lot Help needed moving to and from a bed to a chair (including a wheelchair)?: Total Help needed standing up from a chair using your arms (e.g., wheelchair or bedside chair)?: A Lot Help needed to walk in hospital room?: Total Help needed climbing 3-5 steps with a railing? : Total 6 Click Score: 9    End of Session Equipment Utilized During Treatment: Gait belt;Oxygen Activity Tolerance: Patient limited by lethargy Patient left: in bed;with call bell/phone within reach;with bed alarm set Nurse Communication: Mobility status PT Visit Diagnosis: Unsteadiness on feet (R26.81);Other abnormalities of gait and mobility (R26.89);Muscle weakness (generalized) (M62.81);Difficulty in walking, not elsewhere classified (R26.2)    Time: 8359-8288 PT Time Calculation (min) (ACUTE ONLY): 31 min   Charges:   PT Evaluation $PT Eval Moderate  Complexity: 1 Mod   PT General Charges $$ ACUTE PT VISIT: 1 Visit         Leontine Hilt DPT Acute Rehab Services [phone] Prefer contact via chat   Leontine NOVAK Seydou Hearns 02/11/2024, 5:36 PM

## 2024-02-12 DIAGNOSIS — J690 Pneumonitis due to inhalation of food and vomit: Secondary | ICD-10-CM | POA: Diagnosis not present

## 2024-02-12 DIAGNOSIS — R410 Disorientation, unspecified: Secondary | ICD-10-CM | POA: Diagnosis not present

## 2024-02-12 LAB — BASIC METABOLIC PANEL WITH GFR
Anion gap: 14 (ref 5–15)
BUN: 26 mg/dL — ABNORMAL HIGH (ref 8–23)
CO2: 26 mmol/L (ref 22–32)
Calcium: 10 mg/dL (ref 8.9–10.3)
Chloride: 101 mmol/L (ref 98–111)
Creatinine, Ser: 1.49 mg/dL — ABNORMAL HIGH (ref 0.61–1.24)
GFR, Estimated: 46 mL/min — ABNORMAL LOW
Glucose, Bld: 130 mg/dL — ABNORMAL HIGH (ref 70–99)
Potassium: 4.4 mmol/L (ref 3.5–5.1)
Sodium: 141 mmol/L (ref 135–145)

## 2024-02-12 LAB — CBC
HCT: 43 % (ref 39.0–52.0)
Hemoglobin: 14.3 g/dL (ref 13.0–17.0)
MCH: 33 pg (ref 26.0–34.0)
MCHC: 33.3 g/dL (ref 30.0–36.0)
MCV: 99.3 fL (ref 80.0–100.0)
Platelets: 186 K/uL (ref 150–400)
RBC: 4.33 MIL/uL (ref 4.22–5.81)
RDW: 14.7 % (ref 11.5–15.5)
WBC: 16.2 K/uL — ABNORMAL HIGH (ref 4.0–10.5)
nRBC: 0 % (ref 0.0–0.2)

## 2024-02-12 MED ORDER — SODIUM CHLORIDE 0.9 % IV SOLN
3.0000 g | Freq: Three times a day (TID) | INTRAVENOUS | Status: DC
  Administered 2024-02-12 – 2024-02-17 (×15): 3 g via INTRAVENOUS
  Filled 2024-02-12 (×15): qty 8

## 2024-02-12 MED ORDER — SODIUM CHLORIDE 0.9 % IV SOLN: 3.0000 g | Freq: Three times a day (TID) | INTRAVENOUS | Status: DC

## 2024-02-12 NOTE — Evaluation (Signed)
 Clinical/Bedside Swallow Evaluation Patient Details  Name: Jacob Montes MRN: [id number] Date of Birth: [birth year]  Today's Date: 02/12/2024 Time: SLP Start Time (ACUTE ONLY): 1500 SLP Stop Time (ACUTE ONLY): 1515 SLP Time Calculation (min) (ACUTE ONLY): 15 min  Past Medical History:  Past Medical History:  Diagnosis Date   Actinic keratosis    Acute exacerbation of CHF (congestive heart failure) (HCC) 01/19/2024   Aortic stenosis s/p TAVR    a. 01/2017 s/p TAVR - 23mm Sapien; b. 11/2023 Echo: EF 50-55%, GrII DD, nl RV fxn, mild-mod MR, mild Ca2+ MS, Mod AS, AVA 0.84, mean gradient 32 mmHg, mild perivalvular leak; c. 01/2024 Echo: EF 50-55%, no rwma, GrII DD, nl RV fxn, mod dil LA, mildly dil RA, mild MR/MS, sev AS w/ mod perivalvular leak - mean grad , Vmax 4.52 m/s, DVI 0.13.   CAD (coronary artery disease)    a. 07/2007 s/p CABG x 3 [facility], ); b. 12/2023 MV: No isch/infarct; c. 01/2024 Cath: LM 95ost/m, LAD 167m, LCX 100ost/p/m, OM3 40, RCA small, 95p, 36m, RPDA fills via collats from LAD. VG->OM1 nl, VG->OM3 nl, LIMA->LAD nl.   Cancer (HCC)    Skin   Carotid artery disease    a. 2009 s/p left sided CEA complicated by RLN parlaysis; b. 02/2023 U/S: no significant dzs.   Cataract    Chronic heart failure with preserved ejection fraction (HFpEF) (HCC)    a. 01/2024 Echo: EF 50-55%, GrII DD, nl RV fxn, mod dil LA, mild MR/MS, severe AS, mod perivalvular leak; b. 01/2024 RHC: RA 3, RV 66/2, PA 55/17 (30), PCWP 20.   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   GERD (gastroesophageal reflux disease)    HLD (hyperlipidemia)    Hyperlipidemia 10/15/2015   Hypertension    Nephrolithiasis 11/16/2018   Pulmonary embolism (HCC) 08/2010   Skin cancer    Sleep apnea    Stroke [facility])    Past Surgical History:  Past Surgical History:  Procedure Laterality Date   CARDIAC CATHETERIZATION     CARDIAC VALVE REPLACEMENT  01-19-17   Carotid surgery  Left 03/08/2007   CHOLECYSTECTOMY  02/16/2018   COLONOSCOPY WITH PROPOFOL  N/A 09/01/2022   Procedure: COLONOSCOPY WITH PROPOFOL ;  Surgeon: Maryruth Ole DASEN, MD;  Location: [facility] ENDOSCOPY;  Service: Endoscopy;  Laterality: N/A;   CORONARY ARTERY BYPASS GRAFT     ENDOSCOPIC RETROGRADE CHOLANGIOPANCREATOGRAPHY (ERCP) WITH PROPOFOL  N/A 07/10/2021   Procedure: ENDOSCOPIC RETROGRADE CHOLANGIOPANCREATOGRAPHY (ERCP) WITH PROPOFOL ;  Surgeon: Jinny Carmine, MD;  Location: [facility] ENDOSCOPY;  Service: Endoscopy;  Laterality: N/A;   ERCP N/A 11/18/2018   Procedure: ENDOSCOPIC RETROGRADE CHOLANGIOPANCREATOGRAPHY (ERCP);  Surgeon: Jinny Carmine, MD;  Location: [facility] ENDOSCOPY;  Service: Endoscopy;  Laterality: N/A;   EUS N/A 08/21/2021   Procedure: UPPER ENDOSCOPIC ULTRASOUND (EUS) LINEAR;  Surgeon: Queenie Asberry LABOR, MD;  Location: [facility] ENDOSCOPY;  Service: Gastroenterology;  Laterality: N/A;  [facility]   EYE SURGERY  04-15-99   09-14-99   Heart Bypass N/A 07/2007   HERNIA REPAIR  03-12-88   Not sure of date   left shoulder surgery Left 06/03/2009   POLYPECTOMY  09/01/2022   Procedure: POLYPECTOMY;  Surgeon: Maryruth Ole DASEN, MD;  Location: [facility] ENDOSCOPY;  Service: Endoscopy;;   RIGHT AND LEFT HEART CATH  07/18/2007   right shoulder surgery  2003   RIGHT/LEFT HEART CATH AND CORONARY/GRAFT ANGIOGRAPHY N/A 02/04/2024   Procedure: RIGHT/LEFT HEART CATH AND CORONARY/GRAFT ANGIOGRAPHY;  Surgeon: Darron Deatrice LABOR, MD;  Location: [facility] INVASIVE CV LAB;  Service: Cardiovascular;  Laterality: N/A;   TEE WITHOUT CARDIOVERSION N/A 11/21/2018   Procedure: TRANSESOPHAGEAL ECHOCARDIOGRAM (TEE);  Surgeon: Darron Deatrice LABOR, MD;  Location: [facility] ORS;  Service: Cardiovascular;  Laterality: N/A;   TVAR     HPI:  Patient has been in the hospital on and off since December 10. Initially presented with progressive heart failure iso TAVR valve degeneration w/ TAVR replacement planned.  Pt reportedly doing well until 1/1 when he  was noted to have mental status changes.  MD reports presentation consistent with acute delirium in the setting of hospital stay. Recent concern for aspiration pna. Chest x-ray showed right basilar opacities. Note that pt has a history of left carotid endarterectomy in 2009 complicated by right laryngeal nerve paralysis.    Assessment / Plan / Recommendation  Clinical Impression  Pt demonstrates high risk for aspiration. He is medically vulnerable, has decreased arousal and potential for decreased laryngeal sensation. Recommend pt remain NPO except for meds crushed in puree, ice chips and small sips of water  if he is alert enough to feed himself with assist. Will f/u for PO trials, may need MBS if problem persist.  Under observation SLP able to keep pt awake enough to feed himself a few bites of applesauce and sips of water  with delayed congested cough. No confidence that pts arousal will stay consistent for safe intake. Pt has been gradually improving over the last 2 days. Warned family about total assisted feeding when confused. Ice chips ok.    SLP Visit Diagnosis: Dysphagia, unspecified (R13.10)    Aspiration Risk  Severe aspiration risk    Diet Recommendation           Other Recommendations Oral Care Recommendations: Oral care QID     Swallow Evaluation Recommendations Recommendations: NPO;Ice chips PRN after oral care (small sips of water  if alert and upright) Medication Administration: Crushed with puree   Assistance Recommended at Discharge    Functional Status Assessment Patient has had a recent decline in their functional status and demonstrates the ability to make significant improvements in function in a reasonable and predictable amount of time.  Frequency and Duration min 2x/week  2 weeks       Prognosis Prognosis for improved oropharyngeal function: Fair Barriers to Reach Goals: Severity of deficits      Swallow Study   General HPI: Patient has been in the hospital  on and off since December 10. Initially presented with progressive heart failure iso TAVR valve degeneration w/ TAVR replacement planned.  Pt reportedly doing well until 1/1 when he was noted to have mental status changes.  MD reports presentation consistent with acute delirium in the setting of hospital stay. Recent concern for aspiration pna. Chest x-ray showed right basilar opacities. Note that pt has a history of left carotid endarterectomy in 2009 complicated by right laryngeal nerve paralysis. Type of Study: Bedside Swallow Evaluation Previous Swallow Assessment: none Diet Prior to this Study: NPO Temperature Spikes Noted: No History of Recent Intubation: No Behavior/Cognition: Cooperative;Lethargic/Drowsy;Requires cueing Oral Cavity Assessment: Dry Oral Care Completed by SLP: Yes Oral Cavity - Dentition: Adequate natural dentition Vision: Functional for self-feeding Self-Feeding Abilities: Needs assist Patient Positioning: Upright in bed Baseline Vocal Quality: Normal Volitional Cough: Congested;Weak    Oral/Motor/Sensory Function Overall Oral Motor/Sensory Function: Generalized oral weakness   Ice Chips Ice chips: Impaired Presentation: Spoon Pharyngeal Phase Impairments: Cough - Immediate   Thin Liquid Thin Liquid: Impaired Presentation: Straw Pharyngeal  Phase  Impairments: Cough - Delayed    Nectar Thick Nectar Thick Liquid: Not tested   Honey Thick Honey Thick Liquid: Not tested   Puree Puree: Within functional limits Presentation: Spoon;Self Fed   Solid     Solid: Within functional limits Presentation: Self Fed      Morrell Consuelo Fitch 02/12/2024,4:02 PM

## 2024-02-12 NOTE — Plan of Care (Signed)
" °  Problem: Education: Goal: Knowledge of General Education information will improve Description: Including pain rating scale, medication(s)/side effects and non-pharmacologic comfort measures Outcome: Progressing   Problem: Health Behavior/Discharge Planning: Goal: Ability to manage health-related needs will improve Outcome: Progressing   Problem: Clinical Measurements: Goal: Ability to maintain clinical measurements within normal limits will improve Outcome: Progressing Goal: Will remain free from infection Outcome: Progressing Goal: Diagnostic test results will improve Outcome: Progressing Goal: Respiratory complications will improve Outcome: Progressing Goal: Cardiovascular complication will be avoided Outcome: Progressing   Problem: Activity: Goal: Risk for activity intolerance will decrease Outcome: Progressing   Problem: Nutrition: Goal: Adequate nutrition will be maintained Outcome: Progressing   Problem: Coping: Goal: Level of anxiety will decrease Outcome: Progressing   Problem: Elimination: Goal: Will not experience complications related to bowel motility 02/12/2024 0522 by Vicci Delon PARAS, RN Outcome: Progressing 02/12/2024 0522 by Vicci Delon PARAS, RN Outcome: Progressing Goal: Will not experience complications related to urinary retention 02/12/2024 0522 by Vicci Delon PARAS, RN Outcome: Progressing 02/12/2024 0522 by Vicci Delon PARAS, RN Outcome: Progressing   Problem: Pain Managment: Goal: General experience of comfort will improve and/or be controlled 02/12/2024 0522 by Vicci Delon PARAS, RN Outcome: Progressing 02/12/2024 0522 by Vicci Delon PARAS, RN Outcome: Progressing   Problem: Safety: Goal: Ability to remain free from injury will improve 02/12/2024 0522 by Vicci Delon PARAS, RN Outcome: Progressing 02/12/2024 0522 by Vicci Delon PARAS, RN Outcome: Progressing   Problem: Skin Integrity: Goal: Risk for impaired skin integrity will  decrease 02/12/2024 0522 by Vicci Delon PARAS, RN Outcome: Progressing 02/12/2024 0522 by Vicci Delon PARAS, RN Outcome: Progressing   Problem: Education: Goal: Understanding of cardiac disease, CV risk reduction, and recovery process will improve Outcome: Progressing Goal: Individualized Educational Video(s) Outcome: Progressing   Problem: Activity: Goal: Ability to tolerate increased activity will improve Outcome: Progressing   Problem: Cardiac: Goal: Ability to achieve and maintain adequate cardiovascular perfusion will improve Outcome: Progressing   Problem: Health Behavior/Discharge Planning: Goal: Ability to safely manage health-related needs after discharge will improve Outcome: Progressing   Problem: Safety: Goal: Non-violent Restraint(s) 02/12/2024 0522 by Vicci Delon PARAS, RN Outcome: Progressing 02/12/2024 0522 by Vicci Delon PARAS, RN Outcome: Progressing   "

## 2024-02-12 NOTE — Progress Notes (Signed)
"  °  Progress Note  Patient Name: Jacob Montes Date of Encounter: 02/12/2024 [facility] Cardiologist: Evalene Lunger, MD   Interval Summary   Remains somewhat delirious, though answering questions.  Eyes are open, which according to the wife is the first time this has occurred in a while.  No other acute complaints.  Vital Signs Vitals:   02/11/24 2310 02/12/24 0458 02/12/24 0747 02/12/24 0817  BP: (!) 140/70 (!) 146/75 (!) 123/52   Pulse: (!) 101 (!) 110 95 96  Resp: 18 20 20 20   Temp: 97.9 F (36.6 C) 98.2 F (36.8 C) 99.1 F (37.3 C)   TempSrc: Oral  Axillary   SpO2: 97% 96% 100% 99%  Weight:  89 kg    Height:        Intake/Output Summary (Last 24 hours) at 02/12/2024 0927 Last data filed at 02/12/2024 0504 Gross per 24 hour  Intake 100 ml  Output 900 ml  Net -800 ml      02/12/2024    4:58 AM 02/11/2024    5:12 AM 02/10/2024    3:01 AM  Last 3 Weights  Weight (lbs) 196 lb 3.4 oz 203 lb 11.3 oz 205 lb 6.4 oz  Weight (kg) 89 kg 92.4 kg 93.169 kg      Telemetry/ECG  Sinus rhythm- Personally Reviewed  Physical Exam  GEN: No acute distress.   Neck: No JVD Cardiac: RRR, 2/6 systolic murmur Respiratory: Clear to auscultation bilaterally. GI: Soft, nontender, non-distended  MS: No edema  Assessment & Plan   1.  Bioprosthetic aortic valve degeneration: Patient is delirious.  Was planned for TAVR on Friday.  This has been delayed.  Awaiting further review from valve team.  2.  Acute on chronic diastolic heart failure: No obvious volume overload.  Holding Lasix .  3.  AKI: Creatinine remains elevated.  Holding diuresis.  4.  Coronary artery disease post CABG: No current chest pain.  Continue aspirin  and Crestor .  5.  Delirium: Plan per hospital medicine.  On Haldol  for severe agitation.   For questions or updates, please contact [facility] Please consult [email or web address] for contact info under         Signed, Airianna Kreischer Gladis Norton, MD   "

## 2024-02-12 NOTE — Progress Notes (Signed)
 Pharmacy Antibiotic Note  Jacob Montes is a 84 y.o. male admitted on 02/06/2024 with aspiration pneumonia.  Pharmacy has been consulted for Unasyn  dosing.  Plan: Unasyn  3g Q8H  Monitor renal function, s/sx infection  Height: 5' 6 (167.6 cm) Weight: 89 kg (196 lb 3.4 oz) IBW/kg (Calculated) : 63.8  Temp (24hrs), Avg:98.5 F (36.9 C), Min:97.9 F (36.6 C), Max:99.1 F (37.3 C)  Recent Labs  Lab 02/08/24 0546 02/09/24 0604 02/10/24 0611 02/11/24 0415 02/12/24 0522  WBC 6.5 7.3 8.0 10.1 16.2*  CREATININE 1.34* 1.50* 1.28* 1.41* 1.49*    Estimated Creatinine Clearance: 39.3 mL/min (A) (by C-G formula based on SCr of 1.49 mg/dL (H)).    Allergies[1]  Antimicrobials this admission: None  Dose adjustments this admission: None  Microbiology results: 12/31 MRSA PCR: negative  Thank you for allowing pharmacy to be a part of this patients care.  Maurilio Patten, PharmD PGY1 Pharmacy Resident [facility] 02/12/2024 9:10 AM     [1]  Allergies Allergen Reactions   Atorvastatin Other (See Comments)    aches  myalgia  myalgia    aches  myalgia   Ramipril Other (See Comments) and Cough    cough

## 2024-02-12 NOTE — Progress Notes (Signed)
 "  [facility] PROGRESS NOTE   Jacob Montes FMW:[id number] DOB: [birth year] DOA: 02/06/2024  PCP: [name], MD  Brief History: 48M h/o CAD status post CABG x 3 in June 2009, severe aortic stenosis status post TAVR in December 2018, hypertension, hyperlipidemia, statin intolerance, stroke, carotid stenosis status post left carotid endarterectomy in 2009 complicated by right laryngeal nerve paralysis, HFpEF, nephrolithiasis, remote tobacco abuse, sleep apnea, and remote PE who presented with progressive heart failure iso TAVR valve degeneration w/ TAVR replacement planned 02/11/2023.  Patient noted to be delirious.  Hospital medicine was subsequently consulted for assistance.  Consultants: Cardiology.  Procedures: None yet    Subjective/Interval History: No overnight events reported.  Patient remains asleep this morning.  Wife is at the bedside.     Assessment/Plan:  Acute delirium Based on review of previous records it appears that patient has been in the hospital on and off since December 10.  It appears that he was doing well up until 1/1 when he was noted to have mental status changes.  He was noted to be confused and wanted to go home.  No focal neurological deficits were noted at that time.  No reports of falls or injuries. His presentation is consistent with acute delirium in the setting of hospital stay. He is requiring restraints as he was pulling on his telemetry leads and his IV. Patient noted to be on Haldol  every 6 hours as needed.  Patient received a dose of Zyprexa  on 1/1 and then was given again on 1/2 evening. Does not appear that he was given any Haldol  overnight.  He is noted to be on Rozerem  which was given last night.   Would prefer to use nonpharmacological measures to help with his delirium.  Keep blinds open.  Reorient daily.   PT and OT consulted.    Concern for aspiration pneumonia/leukocytosis/acute respiratory failure with hypoxia Increase in  WBC noted this morning.  Chest x-ray showed right basilar opacities.  Concern for aspiration.  Start Unasyn .  SLP to evaluate. Requiring 4 to 5 L of oxygen by nasal cannula.  Wean down to maintain saturation greater than 92%.  Acute on chronic diastolic CHF LVEF was noted to be 50 to 55%. Patient was diuresed.  Volume seems to have improved.  Cardiology is following and managing.  Not noted to be on scheduled diuretics at this time.   Noted to be negative fluid balance.  Weight has decreased.  Chest x-ray from this morning does suggest some pulmonary edema.  Could also be aspiration as discussed above.  Continue to monitor clinically for now.  Holding furosemide . Monitor electrolytes and renal function closely. Patient is on metoprolol .  Not on any other GDMT at this time.  Bioprosthetic AV degeneration Plan was for TAVR procedure on 1/2 however due to delirium this will need to be postponed.  Defer to cardiology.  Acute kidney injury Baseline renal function appears to be normal.  Creatinine was 0.8 early December.   Creatinine has been creeping up the last few days.  Noted to be 1.41 today.  Continue to monitor closely.  Avoid nephrotoxic agents.  Monitor urine output.  Will check UA.  Coronary artery disease status post CABG Stable.  Patient noted to be on aspirin  and beta-blocker.  Also on statin.  History of asthma Stable.    Normocytic anemia Stable.  No evidence of overt bleeding.  Obesity Estimated body mass index is 31.67 kg/m as calculated from the following:  Height as of this encounter: 5' 6 (1.676 m).   Weight as of this encounter: 89 kg.  Goals of care Discussed in detail with patient's wife this morning.  She brought in his advance directive documents which states his desire for natural death.  Patient's wife confirms that he would not want to be resuscitated or put on life support.  Will initiate DNR orders.  DVT Prophylaxis: Lovenox  Code Status: DNR Family  Communication: Discussed with patient's wife Disposition Plan: To be determined     Medications: Scheduled:  aspirin  EC  81 mg Oral Daily   budesonide  (PULMICORT ) nebulizer solution  0.25 mg Nebulization BID   enoxaparin  (LOVENOX ) injection  40 mg Subcutaneous Daily   metoprolol  succinate  25 mg Oral Daily   multivitamin with minerals  1 tablet Oral Daily   pantoprazole   40 mg Oral Q0600   ramelteon   8 mg Oral QHS   rosuvastatin   40 mg Oral Daily   Continuous:   PRN:acetaminophen , haloperidol  lactate, ipratropium-albuterol , montelukast , nitroGLYCERIN , ondansetron  (ZOFRAN ) IV, polyethylene glycol  Antibiotics: Anti-infectives (From admission, onward)    Start     Dose/Rate Route Frequency Ordered Stop   02/12/24 1000  Ampicillin -Sulbactam (UNASYN ) 3 g in sodium chloride  0.9 % 100 mL IVPB  Status:  Discontinued        3 g 200 mL/hr over 30 Minutes Intravenous Every 8 hours 02/12/24 0909 02/12/24 0911   02/11/24 0400  ceFAZolin  (ANCEF ) IVPB 2g/100 mL premix  Status:  Discontinued        2 g 200 mL/hr over 30 Minutes Intravenous To Surgery 02/09/24 0947 02/11/24 1232       Objective:  Vital Signs  Vitals:   02/11/24 2310 02/12/24 0458 02/12/24 0747 02/12/24 0817  BP: (!) 140/70 (!) 146/75 (!) 123/52   Pulse: (!) 101 (!) 110 95 96  Resp: 18 20 20 20   Temp: 97.9 F (36.6 C) 98.2 F (36.8 C) 99.1 F (37.3 C)   TempSrc: Oral  Axillary   SpO2: 97% 96% 100% 99%  Weight:  89 kg    Height:        Intake/Output Summary (Last 24 hours) at 02/12/2024 1046 Last data filed at 02/12/2024 0504 Gross per 24 hour  Intake 100 ml  Output 900 ml  Net -800 ml   Filed Weights   02/10/24 0301 02/11/24 0512 02/12/24 0458  Weight: 93.2 kg 92.4 kg 89 kg   Continues to be somnolent this morning. Coarse breath sounds bilaterally with few crackles at the bases right more than left.  No wheezing S1-S2 is normal regular.  No S3-S4. Abdomen is soft.  Nontender nondistended. Noted to be  restrained. No obvious focal neurological deficits has been noted when he is awake.  Lab Results:  Data Reviewed: I have personally reviewed following labs and reports of the imaging studies  CBC: Recent Labs  Lab 02/08/24 0546 02/09/24 0604 02/10/24 0611 02/11/24 0415 02/12/24 0522  WBC 6.5 7.3 8.0 10.1 16.2*  HGB 12.1* 12.8* 12.9* 12.5* 14.3  HCT 35.5* 38.0* 37.3* 37.0* 43.0  MCV 95.4 96.9 94.0 95.4 99.3  PLT 121* 148* 153 167 186    Basic Metabolic Panel: Recent Labs  Lab 02/08/24 0546 02/09/24 0604 02/10/24 0611 02/11/24 0414 02/11/24 0415 02/12/24 0522  NA 140 140 141  --  140 141  K 3.5 4.2 3.9  --  3.6 4.4  CL 97* 97* 100  --  99 101  CO2 35* 35* 31  --  27 26  GLUCOSE 123* 87 115*  --  111* 130*  BUN 22 22 21   --  22 26*  CREATININE 1.34* 1.50* 1.28*  --  1.41* 1.49*  CALCIUM  9.5 9.7 9.9  --  10.0 10.0  MG  --   --   --  1.7  --   --     GFR: Estimated Creatinine Clearance: 39.3 mL/min (A) (by C-G formula based on SCr of 1.49 mg/dL (H)).  Coagulation Profile: Recent Labs  Lab 02/09/24 1047  INR 1.1    BNP (last 3 results) Recent Labs    01/28/24 0930 02/01/24 1042 02/11/24 0414  PROBNP 1,299.0* 2,213.0* 4,279.0*     Recent Results (from the past 240 hours)  Resp panel by RT-PCR (RSV, Flu A&B, Covid) Anterior Nasal Swab     Status: None   Collection Time: 02/03/24 12:25 PM   Specimen: Anterior Nasal Swab  Result Value Ref Range Status   SARS Coronavirus 2 by RT PCR NEGATIVE NEGATIVE Final    Comment: (NOTE) SARS-CoV-2 target nucleic acids are NOT DETECTED.  The SARS-CoV-2 RNA is generally detectable in upper respiratory specimens during the acute phase of infection. The lowest concentration of SARS-CoV-2 viral copies this assay can detect is 138 copies/mL. A negative result does not preclude SARS-Cov-2 infection and should not be used as the sole basis for treatment or other patient management decisions. A negative result may occur  with  improper specimen collection/handling, submission of specimen other than nasopharyngeal swab, presence of viral mutation(s) within the areas targeted by this assay, and inadequate number of viral copies(<138 copies/mL). A negative result must be combined with clinical observations, patient history, and epidemiological information. The expected result is Negative.  Fact Sheet for Patients:  bloggercourse.com  Fact Sheet for Healthcare Providers:  seriousbroker.it  This test is no t yet approved or cleared by the [place]  FDA and  has been authorized for detection and/or diagnosis of SARS-CoV-2 by FDA under an Emergency Use Authorization (EUA). This EUA will remain  in effect (meaning this test can be used) for the duration of the COVID-19 declaration under Section 564(b)(1) of the Act, 21 U.S.C.section 360bbb-3(b)(1), unless the authorization is terminated  or revoked sooner.       Influenza A by PCR NEGATIVE NEGATIVE Final   Influenza B by PCR NEGATIVE NEGATIVE Final    Comment: (NOTE) The Xpert Xpress SARS-CoV-2/FLU/RSV plus assay is intended as an aid in the diagnosis of influenza from Nasopharyngeal swab specimens and should not be used as a sole basis for treatment. Nasal washings and aspirates are unacceptable for Xpert Xpress SARS-CoV-2/FLU/RSV testing.  Fact Sheet for Patients: bloggercourse.com  Fact Sheet for Healthcare Providers: seriousbroker.it  This test is not yet approved or cleared by the [place]  FDA and has been authorized for detection and/or diagnosis of SARS-CoV-2 by FDA under an Emergency Use Authorization (EUA). This EUA will remain in effect (meaning this test can be used) for the duration of the COVID-19 declaration under Section 564(b)(1) of the Act, 21 U.S.C. section 360bbb-3(b)(1), unless the authorization is terminated  or revoked.     Resp Syncytial Virus by PCR NEGATIVE NEGATIVE Final    Comment: (NOTE) Fact Sheet for Patients: bloggercourse.com  Fact Sheet for Healthcare Providers: seriousbroker.it  This test is not yet approved or cleared by the [place]  FDA and has been authorized for detection and/or diagnosis of SARS-CoV-2 by FDA under an Emergency Use Authorization (EUA). This EUA will remain in  effect (meaning this test can be used) for the duration of the COVID-19 declaration under Section 564(b)(1) of the Act, 21 U.S.C. section 360bbb-3(b)(1), unless the authorization is terminated or revoked.  Performed at [facility], [address] [place], [place] [address]   Surgical pcr screen     Status: None   Collection Time: 02/09/24  7:21 PM   Specimen: Nasal Mucosa; Nasal Swab  Result Value Ref Range Status   MRSA, PCR NEGATIVE NEGATIVE Final   Staphylococcus aureus NEGATIVE NEGATIVE Final    Comment: (NOTE) The Xpert SA Assay (FDA approved for NASAL specimens in patients 41 years of age and older), is one component of a comprehensive surveillance program. It is not intended to diagnose infection nor to guide or monitor treatment. Performed at [facility], [address] [place], [place] [address]       Radiology Studies: DG CHEST PORT 1 VIEW Result Date: 02/11/2024 EXAM: 1 VIEW(S) XRAY OF THE CHEST 02/11/2024 06:36:15 AM COMPARISON: 02/10/2024 CLINICAL HISTORY: Wheezing 10028; 200808 Hypoxia 200808 FINDINGS: LUNGS AND PLEURA: Mild pulmonary edema. Small right pleural effusion, slightly increased. Trace left pleural effusion, unchanged. Stable right basilar opacities. No pneumothorax. HEART AND MEDIASTINUM: Cardiomegaly, unchanged. Status post aortic valve replacement. BONES AND SOFT TISSUES: Status post median sternotomy. No acute osseous abnormality. IMPRESSION: 1. Mild pulmonary edema. 2. Small right pleural  effusion, slightly increased. 3. Trace left pleural effusion, unchanged. 4. Stable right basilar opacities. Electronically signed by: Waddell Calk MD 02/11/2024 06:53 AM EST RP Workstation: [id number]   DG CHEST PORT 1 VIEW Result Date: 02/10/2024 CLINICAL DATA:  Preop chest exam. EXAM: PORTABLE CHEST 1 VIEW COMPARISON:  Chest CT 02/07/2024 FINDINGS: Status post median sternotomy, TAVR. The lower most sternal wires are broken. The heart is enlarged. Bilateral pleural effusions with bibasilar atelectasis. Central vascular congestion. No pneumothorax. IMPRESSION: 1. Cardiomegaly with central vascular congestion. 2. Bilateral pleural effusions with bibasilar atelectasis. Electronically Signed   By: Andrea Gasman M.D.   On: 02/10/2024 17:37       LOS: 6 days   Jacob Montes  [facility] Pager on [email or web address]  02/12/2024, 10:46 AM   "

## 2024-02-12 NOTE — Progress Notes (Signed)
 Removed bilateral ankle restraints.

## 2024-02-12 NOTE — Evaluation (Signed)
 Occupational Therapy Evaluation Patient Details Name: Jacob Montes MRN: [id number] DOB: Jul 07, 1940 Today's Date: 02/12/2024   History of Present Illness   Pt is an 84 y.o. M presenting to [facility] on 02/06/24 with progressive HF with planned TAVR placement. PMH is significant for CAD, CABG x3 ('09), severe aortic stenosis (TAVR '18), stroke, HTN, and HLD.     Clinical Impressions Pt greeted in bed, supportive family at bedside. Wife answering PLOF info - pt was previously indep with BADL, IADLs, driving, and an active volunteer at his church. Today, he presents with hypoactive delirium based on CAM score and presentation. Eyes opening for brief periods of time with multisensory stim. Wakefulness and engagement improving with HOB elevated. Requires total A for LB ADL, mod/max A for UB ADL. Benefits from hand-over-hand to initiate tasks. Orientation improving with logical cueing. VSS. Deferred OOB 2/2 mental status. Educated family on delirium prevention strategies and acute OT POC.   Pt is currently functioning below baseline and would benefit from ongoing acute OT services to progress towards safe discharge and to facilitate return to prior level of function. Current recommendation is post-acute rehab (< 3 hours/day).     If plan is discharge home, recommend the following:   Two people to help with walking and/or transfers;Two people to help with bathing/dressing/bathroom;Assistance with cooking/housework;Assistance with feeding;Direct supervision/assist for medications management;Direct supervision/assist for financial management;Assist for transportation;Help with stairs or ramp for entrance;Supervision due to cognitive status     Functional Status Assessment   Patient has had a recent decline in their functional status and demonstrates the ability to make significant improvements in function in a reasonable and predictable amount of time.     Equipment Recommendations   Other (comment)  (defer to next level of care)     Recommendations for Other Services         Precautions/Restrictions   Precautions Precautions: Fall Recall of Precautions/Restrictions: Impaired Precaution/Restrictions Comments: hypoactive delirium Restrictions Weight Bearing Restrictions Per Provider Order: No     Mobility Bed Mobility               General bed mobility comments: not assessed 2/2 mental status and hypoactive delirium    Transfers                   General transfer comment: NT      Balance               Standing balance comment: suspect impairments in balance given assist needed for truncal repositioning in supine                           ADL either performed or assessed with clinical judgement   ADL Overall ADL's : Needs assistance/impaired Eating/Feeding: NPO Eating/Feeding Details (indicate cue type and reason): per SLP recommendations Grooming: Maximal assistance;Bed level;Oral care;Wash/dry face Grooming Details (indicate cue type and reason): hand-over-hand to initiate face washing and to maintain ample grip on toothbrush; pt able to unscrew cap on toothpaste bottle, needs A to apply to toothbrush and for thoroughness with upper/bottom rows of teeth Upper Body Bathing: Maximal assistance   Lower Body Bathing: Total assistance   Upper Body Dressing : Maximal assistance   Lower Body Dressing: Total assistance       Toileting- Clothing Manipulation and Hygiene: Total assistance               Vision Baseline Vision/History: 1 Wears glasses (readers per wife)  Perception         Praxis         Pertinent Vitals/Pain Pain Assessment Pain Assessment: Faces Faces Pain Scale: No hurt     Extremity/Trunk Assessment Upper Extremity Assessment Upper Extremity Assessment: Generalized weakness (pt following and mimicking OT movements for MMT assessment)   Lower Extremity Assessment Lower Extremity  Assessment: Defer to PT evaluation       Communication Communication Communication: Impaired Factors Affecting Communication: Hearing impaired;Reduced clarity of speech   Cognition Arousal: Lethargic Behavior During Therapy: Flat affect Cognition: Cognition impaired   Orientation impairments: Time, Place (pt stated January with logical cueing) Awareness: Intellectual awareness impaired Memory impairment (select all impairments): Short-term memory, Working civil service fast streamer, Conservation officer, historic buildings Attention impairment (select first level of impairment): Focused attention Executive functioning impairment (select all impairments): Initiation, Organization, Sequencing, Reasoning, Problem solving OT - Cognition Comments: hypoactive delirium based on presentation and CAM score, brief periods of eye opening with continual multisensory stim and sternal rub; HOB elevated to facilitate improved wakefulness                 Following commands: Impaired Following commands impaired: Follows one step commands inconsistently     Cueing  General Comments   Cueing Techniques: Verbal cues;Gestural cues  VSS throughout, on 4L [place]   Exercises     Shoulder Instructions      Home Living Family/patient expects to be discharged to:: Private residence Living Arrangements: Spouse/significant other Available Help at Discharge: Family;Available 24 hours/day (spouse reports she can provide physical assistance if needed) Type of Home: House Home Access: Level entry     Home Layout: One level     Bathroom Shower/Tub: Producer, Television/film/video: Handicapped height     Home Equipment: Agricultural Consultant (2 wheels);Cane - single point;Shower seat;Grab bars - toilet;Grab bars - tub/shower   Additional Comments: wife providing PLOF information to OT      Prior Functioning/Environment Prior Level of Function : Independent/Modified Independent (wife denies falls hx in past 6 mos)              Mobility Comments: no AD PTA ADLs Comments: indep, manages his medications, very active with volunteering for his church with his wife    OT Problem List: Decreased strength;Decreased range of motion;Decreased activity tolerance;Impaired balance (sitting and/or standing);Decreased cognition;Decreased coordination;Decreased safety awareness;Cardiopulmonary status limiting activity;Obesity   OT Treatment/Interventions: Self-care/ADL training;Therapeutic exercise;Energy conservation;DME and/or AE instruction;Therapeutic activities;Cognitive remediation/compensation;Patient/family education;Balance training      OT Goals(Current goals can be found in the care plan section)   Acute Rehab OT Goals Patient Stated Goal: pt did not state goal; wife's goal is for patient to make a great, total recovery OT Goal Formulation: With patient/family Time For Goal Achievement: 02/26/24 Potential to Achieve Goals: Good   OT Frequency:  Min 2X/week    Co-evaluation              AM-PAC OT 6 Clicks Daily Activity     Outcome Measure Help from another person eating meals?: Total Help from another person taking care of personal grooming?: A Lot Help from another person toileting, which includes using toliet, bedpan, or urinal?: Total Help from another person bathing (including washing, rinsing, drying)?: A Lot Help from another person to put on and taking off regular upper body clothing?: A Lot Help from another person to put on and taking off regular lower body clothing?: Total 6 Click Score: 9   End of Session Equipment Utilized During  Treatment: Oxygen Nurse Communication: Mobility status;Other (comment) (pt's restraints left doffed while working with SLP)  Activity Tolerance: Patient tolerated treatment well Patient left: in bed;with call bell/phone within reach;with bed alarm set;with family/visitor present;Other (comment) (SLP in room, communicated to re-apply B wrist restraints at end  of her session)  OT Visit Diagnosis: Other abnormalities of gait and mobility (R26.89);Muscle weakness (generalized) (M62.81);Feeding difficulties (R63.3);Other symptoms and signs involving cognitive function                Time: 1424-1447 OT Time Calculation (min): 23 min Charges:  OT General Charges $OT Visit: 1 Visit OT Evaluation $OT Eval Moderate Complexity: 1 Mod  Anaisa Radi M. [place], OTR/L [facility] Acute Rehabilitation Services [phone] Secure Chat Preferred  Sadiyah Kangas 02/12/2024, 5:17 PM

## 2024-02-13 DIAGNOSIS — J690 Pneumonitis due to inhalation of food and vomit: Secondary | ICD-10-CM | POA: Diagnosis not present

## 2024-02-13 DIAGNOSIS — R410 Disorientation, unspecified: Secondary | ICD-10-CM | POA: Diagnosis not present

## 2024-02-13 LAB — CBC
HCT: 38.2 % — ABNORMAL LOW (ref 39.0–52.0)
Hemoglobin: 12.6 g/dL — ABNORMAL LOW (ref 13.0–17.0)
MCH: 32.6 pg (ref 26.0–34.0)
MCHC: 33 g/dL (ref 30.0–36.0)
MCV: 98.7 fL (ref 80.0–100.0)
Platelets: 154 K/uL (ref 150–400)
RBC: 3.87 MIL/uL — ABNORMAL LOW (ref 4.22–5.81)
RDW: 14.5 % (ref 11.5–15.5)
WBC: 11.8 K/uL — ABNORMAL HIGH (ref 4.0–10.5)
nRBC: 0 % (ref 0.0–0.2)

## 2024-02-13 LAB — BASIC METABOLIC PANEL WITH GFR
Anion gap: 9 (ref 5–15)
BUN: 33 mg/dL — ABNORMAL HIGH (ref 8–23)
CO2: 32 mmol/L (ref 22–32)
Calcium: 9.3 mg/dL (ref 8.9–10.3)
Chloride: 102 mmol/L (ref 98–111)
Creatinine, Ser: 1.17 mg/dL (ref 0.61–1.24)
GFR, Estimated: >60 mL/min
Glucose, Bld: 92 mg/dL (ref 70–99)
Potassium: 3.7 mmol/L (ref 3.5–5.1)
Sodium: 142 mmol/L (ref 135–145)

## 2024-02-13 NOTE — Progress Notes (Signed)
 "  [facility] PROGRESS NOTE   Jacob Montes FMW:[id number] DOB: [birth year] DOA: 02/06/2024  PCP: [name], MD  Brief History: 26M h/o CAD status post CABG x 3 in June 2009, severe aortic stenosis status post TAVR in December 2018, hypertension, hyperlipidemia, statin intolerance, stroke, carotid stenosis status post left carotid endarterectomy in 2009 complicated by right laryngeal nerve paralysis, HFpEF, nephrolithiasis, remote tobacco abuse, sleep apnea, and remote PE who presented with progressive heart failure iso TAVR valve degeneration w/ TAVR replacement planned 02/11/2023.  Patient noted to be delirious.  Hospital medicine was subsequently consulted for assistance.  Consultants: Cardiology.  Procedures: None yet    Subjective/Interval History: Patient had to be much more awake and alert this morning.  Communicating some.  Still somewhat distracted though.  Off of restraints.   Assessment/Plan:  Acute delirium Based on review of previous records it appears that patient has been in the hospital on and off since December 10.  It appears that he was doing well up until 1/1 when he was noted to have mental status changes.  He was noted to be confused and wanted to go home.  No focal neurological deficits were noted at that time.  No reports of falls or injuries. His presentation is consistent with acute delirium in the setting of hospital stay. He had to be restrained as he was pulling on his telemetry leads and his IV access.  Patient was also given Haldol  and Zyprexa . Patient was started on trazodone. Delirium appears to be subsiding.  Mentation is improving gradually. PT OT eval.  SLP eval.  He remains NPO except for ice chips and meds.  Aspiration pneumonia/leukocytosis/acute respiratory failure with hypoxia Chest x-ray showed right basilar opacities.  Concern for aspiration while he was in his delirious state.  WBC was also noted to be high.  Patient was started  on Unasyn .  WBC improved this morning. Remains on oxygen by nasal cannula at 4 L/min.  Try to wean down to maintain saturation greater than 92%. Remains NPO.  SLP is following.  Acute on chronic diastolic CHF LVEF was noted to be 50 to 55%. Patient was diuresed.  Volume seems to have improved.  Cardiology is following and managing.  Not noted to be on scheduled diuretics at this time.   Noted to be negative fluid balance.  Weight has decreased.   Furosemide  is on hold.  Noted to be on metoprolol . Cardiology is following.  Not on any other GDMT at this time.    Bioprosthetic AV degeneration Plan was for TAVR procedure on 1/2 however due to delirium this had to be canceled.  Defer to cardiology.  Acute kidney injury Baseline renal function appears to be normal.  Creatinine was 0.8 early December.  Increase in creatinine noted over the past few days.  Now noted to be better.  Continue to monitor urine output.  Avoid nephrotoxic agents.  UA reviewed.  Coronary artery disease status post CABG Stable.  Patient noted to be on aspirin  and beta-blocker.  Also on statin.  History of asthma Stable.    Normocytic anemia Stable.  No evidence of overt bleeding.  Obesity Estimated body mass index is 31.74 kg/m as calculated from the following:   Height as of this encounter: 5' 6 (1.676 m).   Weight as of this encounter: 89.2 kg.  Goals of care Patient is DNR/DNI.  Seems to be improving.    DVT Prophylaxis: Lovenox  Code Status: DNR Family Communication: Discussed with patient's  wife and his son Disposition Plan: SNF when medically stable    Medications: Scheduled:  aspirin  EC  81 mg Oral Daily   budesonide  (PULMICORT ) nebulizer solution  0.25 mg Nebulization BID   enoxaparin  (LOVENOX ) injection  40 mg Subcutaneous Daily   metoprolol  succinate  25 mg Oral Daily   multivitamin with minerals  1 tablet Oral Daily   pantoprazole   40 mg Oral Q0600   ramelteon   8 mg Oral QHS    rosuvastatin   40 mg Oral Daily   Continuous:  ampicillin -sulbactam (UNASYN ) IV 3 g (02/13/24 0337)    PRN:acetaminophen , haloperidol  lactate, ipratropium-albuterol , montelukast , nitroGLYCERIN , ondansetron  (ZOFRAN ) IV, polyethylene glycol  Antibiotics: Anti-infectives (From admission, onward)    Start     Dose/Rate Route Frequency Ordered Stop   02/12/24 1200  Ampicillin -Sulbactam (UNASYN ) 3 g in sodium chloride  0.9 % 100 mL IVPB        3 g 200 mL/hr over 30 Minutes Intravenous Every 8 hours 02/12/24 1114     02/12/24 1000  Ampicillin -Sulbactam (UNASYN ) 3 g in sodium chloride  0.9 % 100 mL IVPB  Status:  Discontinued        3 g 200 mL/hr over 30 Minutes Intravenous Every 8 hours 02/12/24 0909 02/12/24 0911   02/11/24 0400  ceFAZolin  (ANCEF ) IVPB 2g/100 mL premix  Status:  Discontinued        2 g 200 mL/hr over 30 Minutes Intravenous To Surgery 02/09/24 0947 02/11/24 1232       Objective:  Vital Signs  Vitals:   02/13/24 0100 02/13/24 0325 02/13/24 0442 02/13/24 0841  BP: (!) 123/52 (!) 126/56  133/67  Pulse: (!) 102 (!) 107  (!) 103  Resp: 16 20  17   Temp: 97.9 F (36.6 C) 98 F (36.7 C)  98.4 F (36.9 C)  TempSrc: Axillary Oral  Axillary  SpO2: 99% 93%  99%  Weight:   89.2 kg   Height:        Intake/Output Summary (Last 24 hours) at 02/13/2024 1059 Last data filed at 02/13/2024 0337 Gross per 24 hour  Intake --  Output 350 ml  Net -350 ml   Filed Weights   02/11/24 0512 02/12/24 0458 02/13/24 0442  Weight: 92.4 kg 89 kg 89.2 kg   General appearance: Awake alert.  In no distress.  Still distracted Resp: Diminished air entry at the bases with few crackles.  No wheezing or rhonchi. Cardio: S1-S2 is normal regular.  No S3-S4.  No rubs murmurs or bruit GI: Abdomen is soft.  Nontender nondistended.  Bowel sounds are present normal.  No masses organomegaly Extremities: No edema.  Deconditioned but able to move his extremities. Neurologic: Oriented.  No focal  neurological deficits.     Lab Results:  Data Reviewed: I have personally reviewed following labs and reports of the imaging studies  CBC: Recent Labs  Lab 02/09/24 0604 02/10/24 0611 02/11/24 0415 02/12/24 0522 02/13/24 0424  WBC 7.3 8.0 10.1 16.2* 11.8*  HGB 12.8* 12.9* 12.5* 14.3 12.6*  HCT 38.0* 37.3* 37.0* 43.0 38.2*  MCV 96.9 94.0 95.4 99.3 98.7  PLT 148* 153 167 186 154    Basic Metabolic Panel: Recent Labs  Lab 02/09/24 0604 02/10/24 0611 02/11/24 0414 02/11/24 0415 02/12/24 0522 02/13/24 0424  NA 140 141  --  140 141 142  K 4.2 3.9  --  3.6 4.4 3.7  CL 97* 100  --  99 101 102  CO2 35* 31  --  27 26 32  GLUCOSE 87 115*  --  111* 130* 92  BUN 22 21  --  22 26* 33*  CREATININE 1.50* 1.28*  --  1.41* 1.49* 1.17  CALCIUM  9.7 9.9  --  10.0 10.0 9.3  MG  --   --  1.7  --   --   --     GFR: Estimated Creatinine Clearance: 50.1 mL/min (by C-G formula based on SCr of 1.17 mg/dL).  Coagulation Profile: Recent Labs  Lab 02/09/24 1047  INR 1.1    BNP (last 3 results) Recent Labs    01/28/24 0930 02/01/24 1042 02/11/24 0414  PROBNP 1,299.0* 2,213.0* 4,279.0*     Recent Results (from the past 240 hours)  Resp panel by RT-PCR (RSV, Flu A&B, Covid) Anterior Nasal Swab     Status: None   Collection Time: 02/03/24 12:25 PM   Specimen: Anterior Nasal Swab  Result Value Ref Range Status   SARS Coronavirus 2 by RT PCR NEGATIVE NEGATIVE Final    Comment: (NOTE) SARS-CoV-2 target nucleic acids are NOT DETECTED.  The SARS-CoV-2 RNA is generally detectable in upper respiratory specimens during the acute phase of infection. The lowest concentration of SARS-CoV-2 viral copies this assay can detect is 138 copies/mL. A negative result does not preclude SARS-Cov-2 infection and should not be used as the sole basis for treatment or other patient management decisions. A negative result may occur with  improper specimen collection/handling, submission of specimen  other than nasopharyngeal swab, presence of viral mutation(s) within the areas targeted by this assay, and inadequate number of viral copies(<138 copies/mL). A negative result must be combined with clinical observations, patient history, and epidemiological information. The expected result is Negative.  Fact Sheet for Patients:  bloggercourse.com  Fact Sheet for Healthcare Providers:  seriousbroker.it  This test is no t yet approved or cleared by the [place]  FDA and  has been authorized for detection and/or diagnosis of SARS-CoV-2 by FDA under an Emergency Use Authorization (EUA). This EUA will remain  in effect (meaning this test can be used) for the duration of the COVID-19 declaration under Section 564(b)(1) of the Act, 21 U.S.C.section 360bbb-3(b)(1), unless the authorization is terminated  or revoked sooner.       Influenza A by PCR NEGATIVE NEGATIVE Final   Influenza B by PCR NEGATIVE NEGATIVE Final    Comment: (NOTE) The Xpert Xpress SARS-CoV-2/FLU/RSV plus assay is intended as an aid in the diagnosis of influenza from Nasopharyngeal swab specimens and should not be used as a sole basis for treatment. Nasal washings and aspirates are unacceptable for Xpert Xpress SARS-CoV-2/FLU/RSV testing.  Fact Sheet for Patients: bloggercourse.com  Fact Sheet for Healthcare Providers: seriousbroker.it  This test is not yet approved or cleared by the [place]  FDA and has been authorized for detection and/or diagnosis of SARS-CoV-2 by FDA under an Emergency Use Authorization (EUA). This EUA will remain in effect (meaning this test can be used) for the duration of the COVID-19 declaration under Section 564(b)(1) of the Act, 21 U.S.C. section 360bbb-3(b)(1), unless the authorization is terminated or revoked.     Resp Syncytial Virus by PCR NEGATIVE NEGATIVE Final     Comment: (NOTE) Fact Sheet for Patients: bloggercourse.com  Fact Sheet for Healthcare Providers: seriousbroker.it  This test is not yet approved or cleared by the [place]  FDA and has been authorized for detection and/or diagnosis of SARS-CoV-2 by FDA under an Emergency Use Authorization (EUA). This EUA will remain in effect (meaning this test can  be used) for the duration of the COVID-19 declaration under Section 564(b)(1) of the Act, 21 U.S.C. section 360bbb-3(b)(1), unless the authorization is terminated or revoked.  Performed at [facility], [address] [place], [place] [address]   Surgical pcr screen     Status: None   Collection Time: 02/09/24  7:21 PM   Specimen: Nasal Mucosa; Nasal Swab  Result Value Ref Range Status   MRSA, PCR NEGATIVE NEGATIVE Final   Staphylococcus aureus NEGATIVE NEGATIVE Final    Comment: (NOTE) The Xpert SA Assay (FDA approved for NASAL specimens in patients 39 years of age and older), is one component of a comprehensive surveillance program. It is not intended to diagnose infection nor to guide or monitor treatment. Performed at [facility], [address], [place], [place] [address]       Radiology Studies: No results found.      LOS: 7 days   Regina Coppolino [employer] on [email or web address]  02/13/2024, 10:59 AM   "

## 2024-02-13 NOTE — Progress Notes (Signed)
 SLP Cancellation Note  Patient Details Name: DAYLYN AZBILL MRN: [id number] DOB: [birth year]   Cancelled treatment:       Reason Eval/Treat Not Completed: Fatigue/lethargy limiting ability to participate. RN and SLP unable to sufficiently rouse. SLP to f/u when more awake/alert. Per RN reports, pt was upright taking bites of applesauce and sips of thin liquid well earlier this AM.  Manuelita Blew M.S. CCC-SLP

## 2024-02-13 NOTE — Plan of Care (Signed)
" °  Problem: Education: Goal: Knowledge of General Education information will improve Description: Including pain rating scale, medication(s)/side effects and non-pharmacologic comfort measures Outcome: Progressing   Problem: Health Behavior/Discharge Planning: Goal: Ability to manage health-related needs will improve Outcome: Progressing   Problem: Clinical Measurements: Goal: Ability to maintain clinical measurements within normal limits will improve Outcome: Progressing Goal: Will remain free from infection Outcome: Progressing Goal: Diagnostic test results will improve Outcome: Progressing Goal: Respiratory complications will improve Outcome: Progressing Goal: Cardiovascular complication will be avoided Outcome: Progressing   Problem: Activity: Goal: Risk for activity intolerance will decrease Outcome: Progressing   Problem: Nutrition: Goal: Adequate nutrition will be maintained Outcome: Progressing   Problem: Coping: Goal: Level of anxiety will decrease Outcome: Progressing   Problem: Elimination: Goal: Will not experience complications related to bowel motility Outcome: Progressing Goal: Will not experience complications related to urinary retention Outcome: Progressing   Problem: Pain Managment: Goal: General experience of comfort will improve and/or be controlled Outcome: Progressing   Problem: Safety: Goal: Ability to remain free from injury will improve Outcome: Progressing   Problem: Skin Integrity: Goal: Risk for impaired skin integrity will decrease Outcome: Progressing   Problem: Education: Goal: Understanding of cardiac disease, CV risk reduction, and recovery process will improve Outcome: Progressing Goal: Individualized Educational Video(s) Outcome: Progressing   Problem: Activity: Goal: Ability to tolerate increased activity will improve Outcome: Progressing   Problem: Cardiac: Goal: Ability to achieve and maintain adequate cardiovascular  perfusion will improve Outcome: Progressing   Problem: Health Behavior/Discharge Planning: Goal: Ability to safely manage health-related needs after discharge will improve Outcome: Progressing   Problem: Safety: Goal: Non-violent Restraint(s) Outcome: Progressing   "

## 2024-02-13 NOTE — Progress Notes (Signed)
 Mobility Specialist Progress Note;   02/13/24 0905  Mobility  Activity Pivoted/transferred from bed to chair;Pivoted/transferred to/from [facility]  Level of Assistance Moderate assist, patient does 50-74%  Assistive Device Front wheel walker  Distance Ambulated (ft) 8 ft  Activity Response Tolerated fair  Mobility Referral Yes  Mobility visit 1 Mobility  Mobility Specialist Start Time (ACUTE ONLY) 0905  Mobility Specialist Stop Time (ACUTE ONLY) 0930  Mobility Specialist Time Calculation (min) (ACUTE ONLY) 25 min   RN requested pt to be mobilized this AM. Spouse present and pt seeming a bit more alert this AM, able to converse some throughout. Pt required ModA for bed mobility and heavy MinA to stand from elevated bed. Requested to use BSC, BM successful and pericare performed for pt. Pt did require up to 8LO2 for mobility d/t desat to 82% on 3LO2. Required seated rest break on BSC and increase in O2 to recover SPO2 to 94%, 8LO2. Able to then transfer from [facility] to chair w/o fault. Pt returned to 3LO2, SPO2 94% and left in chair with all needs met, alarm on. Spouse present and RN was notified.   Lauraine Erm Mobility Specialist Please contact via SecureChat or [employer] [phone]

## 2024-02-13 NOTE — Progress Notes (Signed)
 Speech Language Pathology Treatment: Dysphagia  Patient Details Name: Jacob Montes MRN: [id number] DOB: 1941-01-05 Today's Date: 02/13/2024 Time: 8583-8558 SLP Time Calculation (min) (ACUTE ONLY): 25 min  Assessment / Plan / Recommendation Clinical Impression  Pt presents more awake and alert this afternoon but still with signs of potential aspiration. Given pt is as awake and alert as he was for this treatment, he is ok to start a puree/ nectar thick liquid with FREQUENT oral care (before and after PO intake) with general safe swallow precautions. MBSS to follow as time permits.  Pt was observed taking pills crushed in apple sauce. He had oral holding, several swallows, and difficulty initiating a swallow. His trials of NTL and thin liquid via straw had similar oral deficits but also had immediate productive coughs with thin and delayed coughs with NTL. However difficult to ascertain whether this was related to trials given chronic cough. Pt and family educated on the s/s of aspiration and the risks. Agreeable to trial this diet and if staff and family do not notice increased coughing with meals then he can continue on the diet until further objective measures can be obtained. SLP to f/u closely.    HPI HPI: Patient has been in the hospital on and off since December 10. Initially presented with progressive heart failure iso TAVR valve degeneration w/ TAVR replacement planned.  Pt reportedly doing well until 1/1 when he was noted to have mental status changes.  MD reports presentation consistent with acute delirium in the setting of hospital stay. Recent concern for aspiration pna. Chest x-ray showed right basilar opacities. Note that pt has a history of left carotid endarterectomy in 2009 complicated by right laryngeal nerve paralysis.      SLP Plan  MBS        Swallow Evaluation Recommendations   Recommendations: PO diet PO Diet Recommendation: Dysphagia 1 (Pureed);Mildly thick liquids  (Level 2, nectar thick) Liquid Administration via: Cup Medication Administration: Crushed with puree Supervision: Full assist for feeding;Full supervision/cueing for swallowing strategies Postural changes: Position pt fully upright for meals;Stay upright 30-60 min after meals Oral care recommendations: Oral care QID (4x/day);Oral care before PO     Recommendations                     Oral care QID   Intermittent Supervision/Assistance Dysphagia, unspecified (R13.10)     MBS    Manuelita Blew M.S. CCC-SLP

## 2024-02-13 NOTE — Progress Notes (Signed)
"  °  Progress Note  Patient Name: Jacob Montes Date of Encounter: 02/13/2024 Calypso Montes Cardiologist: Evalene Lunger, MD   Interval Summary   More awake and alert today.  Worked with PT yesterday.  Vital Signs Vitals:   02/12/24 1933 02/13/24 0100 02/13/24 0325 02/13/24 0442  BP: (!) 123/57 (!) 123/52 (!) 126/56   Pulse: 95 (!) 102 (!) 107   Resp: 16 16 20    Temp: 97.6 F (36.4 C) 97.9 F (36.6 C) 98 F (36.7 C)   TempSrc: Oral Axillary Oral   SpO2: 100% 99% 93%   Weight:    89.2 kg  Height:        Intake/Output Summary (Last 24 hours) at 02/13/2024 0803 Last data filed at 02/13/2024 9662 Gross per 24 hour  Intake --  Output 350 ml  Net -350 ml      02/13/2024    4:42 AM 02/12/2024    4:58 AM 02/11/2024    5:12 AM  Last 3 Weights  Weight (lbs) 196 lb 10.4 oz 196 lb 3.4 oz 203 lb 11.3 oz  Weight (kg) 89.2 kg 89 kg 92.4 kg      Telemetry/ECG  Sinus rhythm with sinus tachycardia- Personally Reviewed  Physical Exam  GEN: No acute distress.   Neck: No JVD Cardiac: RRR, no murmurs, rubs, or gallops.  Respiratory: Coarse bilateral GI: Soft, nontender, non-distended  MS: No edema  Assessment & Plan   1.  Bioprosthetic aortic valve degeneration: Planning for TAVR.  On hold currently due to delirium.  Jacob Montes have the valve team see the patient tomorrow for further discussions on timing.  2.  Acute on chronic diastolic heart failure: Likely due to valve degeneration.  Creatinine improved.  If continues to improve, may restart Lasix  tomorrow.  3.  Coronary artery disease: Post CABG.  No current chest pain.  Continue aspirin  and Crestor .  4.  Hyperlipidemia: On Crestor   5.  Acute kidney injury: Holding Lasix  for now.  Creatinine improving.  6.  Delirium/agitation: Plan per hospitalist team   For questions or updates, please contact Jacob Montes Please consult [email or web address] for contact info under         Signed, Jacob Cordaro Gladis Norton, MD   "

## 2024-02-14 ENCOUNTER — Inpatient Hospital Stay (HOSPITAL_COMMUNITY)

## 2024-02-14 DIAGNOSIS — R410 Disorientation, unspecified: Secondary | ICD-10-CM | POA: Diagnosis not present

## 2024-02-14 DIAGNOSIS — J690 Pneumonitis due to inhalation of food and vomit: Secondary | ICD-10-CM | POA: Diagnosis not present

## 2024-02-14 LAB — BASIC METABOLIC PANEL WITH GFR
Anion gap: 9 (ref 5–15)
BUN: 37 mg/dL — ABNORMAL HIGH (ref 8–23)
CO2: 32 mmol/L (ref 22–32)
Calcium: 9.6 mg/dL (ref 8.9–10.3)
Chloride: 102 mmol/L (ref 98–111)
Creatinine, Ser: 1.16 mg/dL (ref 0.61–1.24)
GFR, Estimated: >60 mL/min
Glucose, Bld: 117 mg/dL — ABNORMAL HIGH (ref 70–99)
Potassium: 4 mmol/L (ref 3.5–5.1)
Sodium: 143 mmol/L (ref 135–145)

## 2024-02-14 LAB — CBC
HCT: 39.9 % (ref 39.0–52.0)
Hemoglobin: 13.2 g/dL (ref 13.0–17.0)
MCH: 32.5 pg (ref 26.0–34.0)
MCHC: 33.1 g/dL (ref 30.0–36.0)
MCV: 98.3 fL (ref 80.0–100.0)
Platelets: 188 K/uL (ref 150–400)
RBC: 4.06 MIL/uL — ABNORMAL LOW (ref 4.22–5.81)
RDW: 14.5 % (ref 11.5–15.5)
WBC: 11.8 K/uL — ABNORMAL HIGH (ref 4.0–10.5)
nRBC: 0 % (ref 0.0–0.2)

## 2024-02-14 LAB — PHOSPHORUS: Phosphorus: 3.1 mg/dL (ref 2.5–4.6)

## 2024-02-14 LAB — MAGNESIUM: Magnesium: 2 mg/dL (ref 1.7–2.4)

## 2024-02-14 NOTE — Plan of Care (Signed)
" °  Problem: Education: Goal: Knowledge of General Education information will improve Description: Including pain rating scale, medication(s)/side effects and non-pharmacologic comfort measures Outcome: Progressing   Problem: Health Behavior/Discharge Planning: Goal: Ability to manage health-related needs will improve Outcome: Progressing   Problem: Clinical Measurements: Goal: Ability to maintain clinical measurements within normal limits will improve Outcome: Progressing Goal: Will remain free from infection Outcome: Progressing Goal: Diagnostic test results will improve Outcome: Progressing Goal: Respiratory complications will improve Outcome: Progressing Goal: Cardiovascular complication will be avoided Outcome: Progressing   Problem: Activity: Goal: Risk for activity intolerance will decrease Outcome: Progressing   Problem: Nutrition: Goal: Adequate nutrition will be maintained Outcome: Progressing   Problem: Coping: Goal: Level of anxiety will decrease Outcome: Progressing   Problem: Elimination: Goal: Will not experience complications related to bowel motility Outcome: Progressing Goal: Will not experience complications related to urinary retention Outcome: Progressing   Problem: Pain Managment: Goal: General experience of comfort will improve and/or be controlled Outcome: Progressing   Problem: Safety: Goal: Ability to remain free from injury will improve Outcome: Progressing   Problem: Skin Integrity: Goal: Risk for impaired skin integrity will decrease Outcome: Progressing   Problem: Education: Goal: Understanding of cardiac disease, CV risk reduction, and recovery process will improve Outcome: Progressing Goal: Individualized Educational Video(s) Outcome: Progressing   Problem: Activity: Goal: Ability to tolerate increased activity will improve Outcome: Progressing   Problem: Cardiac: Goal: Ability to achieve and maintain adequate cardiovascular  perfusion will improve Outcome: Progressing   Problem: Health Behavior/Discharge Planning: Goal: Ability to safely manage health-related needs after discharge will improve Outcome: Progressing   Problem: Safety: Goal: Non-violent Restraint(s) Outcome: Progressing   "

## 2024-02-14 NOTE — TOC Progression Note (Signed)
 Transition of Care [facility]) - Progression Note    Patient Details  Name: Jacob Montes MRN: [id number] Date of Birth: 05-09-1940  [facility] CM/SW Contact  Isaiah Public, [name] Phone Number: 02/14/2024, 3:23 PM  Clinical Narrative:     CSW received consult for possible SNF placement at time of discharge. Due to patients current orientation CSW spoke with patients spouse Dickey and son Alm by phone regarding PT recommendation of SNF placement for patient at time of discharge. Patients son reports PTA patient comes from home with spouse. Patients spouse and patients son Alm expressed understanding of PT recommendation and gave CSW permission to fax out for SNF placement to see what SNF offers patient receives, but currently undecided on patients dc plan. Patients son informed CSW that it depends on how patient is progressing closer to patient being medically ready. CSW plans to follow up with patients son with SNF bed offers and to confirm patients dc plan. CSW discussed insurance authorization process. All questions answered.No further questions reported at this time. CSW to continue to follow and assist with discharge planning needs.   Expected Discharge Plan:  (TBD agreeable to fax out for SNF follow up on place closer to being ready) Barriers to Discharge: Continued Medical Work up               Expected Discharge Plan and Services In-house Referral: Clinical Social Work Discharge Planning Services: CM Consult Post Acute Care Choice: NA Living arrangements for the past 2 months: Single Family Home                   DME Agency: NA       HH Arranged: NA           Social Drivers of Health (SDOH) Interventions SDOH Screenings   Food Insecurity: No Food Insecurity (02/06/2024)  Housing: Low Risk (02/06/2024)  Transportation Needs: No Transportation Needs (02/06/2024)  Utilities: Not At Risk (02/06/2024)  Alcohol Screen: Low Risk (07/03/2021)  Depression  (PHQ2-9): Low Risk (10/23/2021)  Financial Resource Strain: Low Risk  (12/02/2023)   Received from [facility]  Physical Activity: Inactive (12/02/2023)   [facility] from [facility]  Social Connections: Socially Integrated (02/06/2024)  Stress: No Stress Concern Present (11/05/2023)   Received from [facility]  Tobacco Use: Medium Risk (02/07/2024)  Health Literacy: Adequate Health Literacy (11/05/2023)   Received from [facility]    Readmission Risk Interventions     No data to display

## 2024-02-14 NOTE — Progress Notes (Signed)
 Modified Barium Swallow Study  Patient Details  Name: Jacob Montes MRN: [id number] Date of Birth: 1940/02/23  Today's Date: 02/14/2024  Modified Barium Swallow completed.  Full report located under Chart Review in the Imaging Section.  History of Present Illness 84 yo male presenting to ED 12/28 with AMS. Admitted with progressive HF and planned TAVR 1/2, which was delayed due to ongoing delirium. CXR shows R basilar opacities. 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 PMH includes CAD s/p CABG x3 in June 2009, severe aortic stenosis s/p TAVR December 2018, HTN, HLD, statin intolerance, prior CVA, carotid stenosis s/p L carotid endarterectomy 2009 complicated by R laryngeal nerve paralysis, HFpEF, nephrolithiasis, remote tobacco use   Clinical Impression RLN paralysis and esophageal retention are suspected to increase the likelihood of intermittent aspiration even PTA. Given acute pneumonia and AMS, will proceed more conservatively at first. Recommend advancing to Dys 3 solids and continuing nectar thick liquids. Expect good prognosis to return to thin liquids once respiratory status and mentation are more stable. Discussed results with pt and his spouse. Will f/u.   Pt exhibits moderate oropharyngeal dysphagia characterized by mistiming, which is suspected to be primarily impacted by fluctuating mentation. Although laryngeal elevation and epiglottic inversion are complete, one instance of silent aspiration (PAS 8) and more consistent occurrences of frank penetration (PAS 5) happen when the swallow is initiated at the pyriform sinuses, specifically with thin liquids. Swallow initiation occurs earlier with thickened liquids, preventing further airway invasion (PAS 2, considered WFL). Once boluses reach his vocal folds, he has reduced ability to prevent them from progressing further or at ejecting them given history of RLN paralysis. The 13 mm barium tablet was given  whole in puree with diffuse retention throughout the proximal esophagus requiring multiple boluses of puree and nectar thick liquid liquid to clear.  DIGEST Swallow Severity Rating*  Safety: 2  Efficiency: 1  Overall Pharyngeal Swallow Severity: 2 (moderate) 1: mild; 2: moderate; 3: severe; 4: profound  *The Dynamic Imaging Grade of Swallowing Toxicity is standardized for the head and neck cancer population, however, demonstrates promising clinical applications across populations to standardize the clinical rating of pharyngeal swallow safety and severity.  Factors that may increase risk of adverse event in presence of aspiration Noe & Lianne 2021): Respiratory or GI disease;Limited mobility;Frail or deconditioned;Dependence for feeding and/or oral hygiene  Swallow Evaluation Recommendations Recommendations: PO diet PO Diet Recommendation: Dysphagia 3 (Mechanical soft);Mildly thick liquids (Level 2, nectar thick) Liquid Administration via: Cup;Straw Medication Administration: Whole meds with puree Supervision: Staff to assist with self-feeding;Intermittent supervision/cueing for swallowing strategies Swallowing strategies  : Minimize environmental distractions;Slow rate;Small bites/sips Postural changes: Position pt fully upright for meals;Stay upright 30-60 min after meals Oral care recommendations: Oral care BID (2x/day) Recommended consults: Consider esophageal assessment   Damien Blumenthal, M.A., CCC-SLP Speech Language Pathology, Acute Rehabilitation Services  Secure Chat preferred [phone]  02/14/2024,3:19 PM

## 2024-02-14 NOTE — NC FL2 (Signed)
 " [place]  MEDICAID FL2 LEVEL OF CARE FORM     IDENTIFICATION  Patient Name: [name] Birthdate: 1940-10-17 Sex: male Admission Date (Current Location): 02/06/2024  [facility] and [place] Number:  Producer, Television/film/video and Address:  [facility] [place], [address] [place], [place] [address]      Provider Number: [id number]  Attending Physician Name and Address:  Verdene Purchase, MD  Relative Name and Phone Number:  Jheremy Boger (spouse) [phone], Alm Balloon (son),[phone]    Current Level of Care: Hospital Recommended Level of Care: Skilled Nursing Facility Prior Approval Number:    Date Approved/Denied:   PASRR Number: [id number]  Discharge Plan: SNF    Current Diagnoses: Patient Active Problem List   Diagnosis Date Noted   Severe aortic stenosis 02/06/2024   Acute on chronic heart failure with preserved ejection fraction (HFpEF) (HCC) 02/05/2024   Aortic valve disorder 02/04/2024   CHF (congestive heart failure) (HCC) 02/01/2024   Acute exacerbation of CHF (congestive heart failure) (HCC) 01/19/2024   Fever 07/18/2021   Liver abscess 07/15/2021   Choledocholithiasis 07/10/2021   Elevated liver enzymes 07/09/2021   Acute cholangitis with choledocholithiasis 07/08/2021   Atypical chest pain 04/22/2021   History of CAD 04/22/2021   Paroxysmal tachycardia (HCC) 04/22/2021   History of transcatheter aortic valve replacement (TAVR) 04/22/2021   Morbid obesity (HCC) 04/22/2021   CKD (chronic kidney disease) 12/03/2020   Bacteremia    Common bile duct stone    Ascending cholangitis (HCC) 11/17/2018   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 11/16/2018   Dysmetabolic syndrome 11/16/2018   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   Hyperglycemia 11/16/2018   Tear of meniscus of left knee 05/24/2018   Constipation 10/07/2016   Aortic stenosis 10/15/2015   Benign essential hypertension 10/15/2015   Carotid stenosis, bilateral 10/15/2015   Hypogonadism,  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   Nephrolithiasis 10/15/2015   Sleep apnea 10/15/2015   Hyperlipidemia 10/15/2015   Polyosteoarthritis, 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   RCT (rotator cuff tear) 06/03/2009   Arteriosclerotic coronary artery disease 07/11/2007   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   Dyslipidemia 09/11/2003   Metabolic syndrome 09/11/2003    Orientation RESPIRATION BLADDER Height & Weight     Place, Time, Self  O2 (3 liters of Nasal cannula) Continent, External catheter (External Urinary Catheter) Weight: 195 lb 1.7 oz (88.5 kg) Height:  5' 6 (167.6 cm)  BEHAVIORAL SYMPTOMS/MOOD NEUROLOGICAL BOWEL NUTRITION STATUS      Continent Diet (Please see discharge summary)  AMBULATORY STATUS COMMUNICATION OF NEEDS Skin   Extensive Assist Verbally Other (Comment) (Erythema,Buttocks,Bil.,Wound/Incision LDAs)                       Personal Care Assistance Level of Assistance  Bathing, Feeding, Dressing Bathing Assistance: Maximum assistance Feeding assistance: Maximum assistance Dressing Assistance: Maximum assistance     Functional Limitations Info  Sight, Hearing, Speech Sight Info: Adequate Hearing Info: Adequate Speech Info: Adequate    SPECIAL CARE FACTORS FREQUENCY  PT (By licensed PT), OT (By licensed OT)     PT Frequency: 5x min weekly OT Frequency: 5x min weekly            Contractures Contractures Info: Not present    Additional Factors Info  Code Status, Allergies Code Status Info: DNR Allergies Info: Atorvastatin,Ramipril           Current Medications (02/14/2024):  This is the current hospital active medication list Current Facility-Administered Medications  Medication Dose Route Frequency Provider Last  Rate Last Admin   acetaminophen  (TYLENOL ) tablet 650 mg  650 mg Oral Q4H PRN Haley, Sheng L, PA-C   650 mg at 02/13/24 9667   Ampicillin -Sulbactam (UNASYN ) 3 g in sodium chloride  0.9 % 100 mL IVPB  3 g Intravenous Q8H Krishnan, Gokul, MD 200 mL/hr at 02/14/24  1156 3 g at 02/14/24 1156   aspirin  EC tablet 81 mg  81 mg Oral Daily Haley, Sheng L, PA-C   81 mg at 02/13/24 1950   budesonide  (PULMICORT ) nebulizer solution 0.25 mg  0.25 mg Nebulization BID Haley, Sheng L, PA-C   0.25 mg at 02/14/24 9257   enoxaparin  (LOVENOX ) injection 40 mg  40 mg Subcutaneous Daily Henry Shaver B, NP   40 mg at 02/14/24 0930   haloperidol  lactate (HALDOL ) injection 1 mg  1 mg Intravenous Q6H PRN Sundil, Subrina, MD   1 mg at 02/10/24 2214   ipratropium-albuterol  (DUONEB) 0.5-2.5 (3) MG/3ML nebulizer solution 3 mL  3 mL Nebulization Q6H PRN Thukkani, Arun K, MD   3 mL at 02/11/24 0351   metoprolol  succinate (TOPROL -XL) 24 hr tablet 25 mg  25 mg Oral Daily Haley, Sheng L, PA-C   25 mg at 02/14/24 0930   montelukast  (SINGULAIR ) tablet 10 mg  10 mg Oral QHS PRN Haley, Sheng L, PA-C       multivitamin with minerals tablet 1 tablet  1 tablet Oral Daily Darryle Currier L, PA-C   1 tablet at 02/14/24 0930   nitroGLYCERIN  (NITROSTAT ) SL tablet 0.4 mg  0.4 mg Sublingual Q5 Min x 3 PRN Haley, Sheng L, PA-C       ondansetron  (ZOFRAN ) injection 4 mg  4 mg Intravenous Q6H PRN Haley, Sheng L, PA-C   4 mg at 02/09/24 2350   pantoprazole  (PROTONIX ) EC tablet 40 mg  40 mg Oral Q0600 Haley, Sheng L, PA-C   40 mg at 02/14/24 0801   polyethylene glycol (MIRALAX  / GLYCOLAX ) packet 17 g  17 g Oral Daily PRN Haley, Sheng L, PA-C       ramelteon  (ROZEREM ) tablet 8 mg  8 mg Oral QHS Strader, Brittany M, PA-C   8 mg at 02/13/24 1950   rosuvastatin  (CRESTOR ) tablet 40 mg  40 mg Oral Daily Haley, Sheng L, PA-C   40 mg at 02/13/24 1950     Discharge Medications: Please see discharge summary for a list of discharge medications.  Relevant Imaging Results:  Relevant Lab Results:   Additional Information [id number]  Isaiah Public, [name]     "

## 2024-02-14 NOTE — Progress Notes (Addendum)
 "  Progress Note  Patient Name: Jacob Montes Date of Encounter: 02/14/2024 [facility] Cardiologist: Evalene Lunger, MD    Interval Summary   Patient awake, but falls asleep quickly during assessment. Per wife, he ate a few bites of breakfast today. Otherwise has not been eating or drinking. Mentation improving, but he continues to have episodes of confusion/lethargy. No new shortness of breath. No ankle edema.   Vital Signs Vitals:   02/14/24 0447 02/14/24 0512 02/14/24 0744 02/14/24 0811  BP:  (!) 130/59    Pulse:  (!) 101    Resp:  19  18  Temp:  98 F (36.7 C)  98.9 F (37.2 C)  TempSrc:  Oral  Oral  SpO2:  98% 99% 92%  Weight: 88.5 kg     Height:        Intake/Output Summary (Last 24 hours) at 02/14/2024 0853 Last data filed at 02/14/2024 0541 Gross per 24 hour  Intake 400 ml  Output 1400 ml  Net -1000 ml      02/14/2024    4:47 AM 02/13/2024    4:42 AM 02/12/2024    4:58 AM  Last 3 Weights  Weight (lbs) 195 lb 1.7 oz 196 lb 10.4 oz 196 lb 3.4 oz  Weight (kg) 88.5 kg 89.2 kg 89 kg      Telemetry/ECG  NSR - Personally Reviewed  Physical Exam  GEN: No acute distress.  Sitting upright in bed  Neck: No JVD Cardiac:  RRR. Grade 3/6 systolic murmur  Respiratory: Clear to auscultation bilaterally. Normal WOB on [place]  GI: Soft, nontender, non-distended  MS: No edema in BLE  Assessment & Plan   Bioprosthetic aortic valve degeneration  - Pending TAVR- was initially scheduled 1/2, but was canceled due to delirium  - Patient continues to have episodes of confusion and lethargy per wife. He ate a few bites of breakfast today, but otherwise has not been eating or drinking  - Dr. Verlin to see today and assess TAVR timing   Chronic Diastolic heart failure  - Echocardiogram 02/01/24 showed EF 50-55%, no wall motion abnormalities, grade II DD, mild MR, TAVR valve with severe stenosis and moderate perivalvular leak  - Patient appears euvolemic on exam. He has not been  eating or drinking very much. Urine appears concentrated.  - Although AKI has resolved and creatinine is now 1.16, with his poor PO intake continue to hold lasix    CAD  - s/p CABG  - Denies chest pain  - Continue ASA 81 mg daily, metoprolol  succinate 25 mg daily and crestor  40 mg daily   AKI - Creatinine 1.50 on 12/31. Lasix  was held and renal function improved. Creatinine 1.16 today   Delirium/agitation  - Patient improving, but remains lethargic. Hospitalist consulted   Aspiration pneumonia  - Hospitalist following   For questions or updates, please contact [facility] Please consult [email or web address] for contact info under       Signed, Rollo FABIENE Louder, PA-C   Novalyn Lajara JONELLE Balloon was seen by me today along with Rollo Louder, PA-C.  I have personally performed an evaluation on this patient.  My findings are as follows: 84 y.o. male with chronic diastolic CHF, CAD s/p CABG and aortic valve disease s/p TAVR in 2018 admitted with CHF. He is now euvolemic. Cardiac cath with patent grafts. Severe stenosis of the TAVR valve with AI.   Data: EKG(s) and pertinent labs, studies, etc were personally reviewed and interpreted by me:  No EKG Tele: sinus Otherwise, I agree with data as outlined by the advanced practice provider.  Exam performed by me: Gen: NAD, drowsy Neck: No JVD Cardiac: Systolic murmur Lungs: rhonci Extremities: no LE edema  My Assessment and Plan:  Pt admitted with failure of his TAVR valve with severe AS/moderate AI. He was volume overloaded but is now euvolemic following diuresis. Planning for valve in valve TAVR but delayed due to altered mental status. He is still somnolent but improving  per his wife.  We will continue to follow and decide on the possibility of inpatient TAVR.   Signed,  Lonni Cash, MD  02/14/2024 10:10 AM    "

## 2024-02-14 NOTE — Progress Notes (Signed)
 "  [facility] PROGRESS NOTE   Jacob Montes FMW:[id number] DOB: 1940-06-14 DOA: 02/06/2024  PCP: [name], MD  Brief History: 83M h/o CAD status post CABG x 3 in June 2009, severe aortic stenosis status post TAVR in December 2018, hypertension, hyperlipidemia, statin intolerance, stroke, carotid stenosis status post left carotid endarterectomy in 2009 complicated by right laryngeal nerve paralysis, HFpEF, nephrolithiasis, remote tobacco abuse, sleep apnea, and remote PE who presented with progressive heart failure iso TAVR valve degeneration w/ TAVR replacement planned 02/11/2023.  Patient noted to be delirious.  Hospital medicine was subsequently consulted for assistance.  Consultants: Cardiology.  Procedures: None yet   Subjective: Patient making significant progress.  He is now sitting at the side of the bed eating his breakfast this morning.  Wife and son at the bedside.     Assessment/Plan:  Acute delirium Based on review of previous records it appears that patient has been in the hospital on and off since December 10.  It appears that he was doing well up until 1/1 when he was noted to have mental status changes.  He was noted to be confused and wanted to go home.  No focal neurological deficits were noted at that time.  No reports of falls or injuries. His presentation is consistent with acute delirium in the setting of hospital stay. He had to be restrained as he was pulling on his telemetry leads and his IV access.  Patient was also given Haldol  and Zyprexa . Patient was started on Rozerem . Patient appears to be improving.  His delirium appears to have resolved.  Restraints were removed.  This morning he is awake alert but still somewhat distracted.  However he is much better compared to a few days ago.  No focal neurological deficits. Currently on dysphagia 1 diet.  SLP to reevaluate.  PT OT to reevaluate..  Aspiration pneumonia/leukocytosis/acute respiratory  failure with hypoxia Chest x-ray showed right basilar opacities.  Concern for aspiration while he was in his delirious state.  WBC was also noted to be high.  Patient was started on Unasyn .   Respiratory status is stable.  WBC has improved.  He remains afebrile. Remains on oxygen by nasal cannula at 3 L.  Continue to wean down as tolerated.  Incentive spirometry may also help. SLP is following.  Acute on chronic diastolic CHF LVEF was noted to be 50 to 55%. Patient was diuresed.  Volume seems to have improved.  Cardiology is following and managing.  Not noted to be on scheduled diuretics at this time.   Noted to be negative fluid balance.  Weight has decreased.   Furosemide  is on hold.  Noted to be on metoprolol . Cardiology is following.  Not on any other GDMT at this time.    Bioprosthetic AV degeneration Plan was for TAVR procedure on 1/2 however due to delirium this had to be canceled.  Defer to cardiology.  Acute kidney injury Baseline renal function appears to be normal.  Creatinine was 0.8 early December.  Increase in creatinine noted over the past few days.  Now back to baseline.   Continue to monitor urine output.  Avoid nephrotoxic agents.  UA reviewed.  Coronary artery disease status post CABG Stable.  Patient noted to be on aspirin  and beta-blocker.  Also on statin.  History of asthma Stable.    Normocytic anemia Stable.  No evidence of overt bleeding.  Obesity Estimated body mass index is 31.49 kg/m as calculated from the following:  Height as of this encounter: 5' 6 (1.676 m).   Weight as of this encounter: 88.5 kg.  Goals of care Patient is DNR/DNI.  Seems to be improving.    DVT Prophylaxis: Lovenox  Code Status: DNR Family Communication: Discussed with patient's wife and his son Disposition Plan: SNF when medically stable    Medications: Scheduled:  aspirin  EC  81 mg Oral Daily   budesonide  (PULMICORT ) nebulizer solution  0.25 mg Nebulization BID    enoxaparin  (LOVENOX ) injection  40 mg Subcutaneous Daily   metoprolol  succinate  25 mg Oral Daily   multivitamin with minerals  1 tablet Oral Daily   pantoprazole   40 mg Oral Q0600   ramelteon   8 mg Oral QHS   rosuvastatin   40 mg Oral Daily   Continuous:  ampicillin -sulbactam (UNASYN ) IV Stopped (02/14/24 0515)    PRN:acetaminophen , haloperidol  lactate, ipratropium-albuterol , montelukast , nitroGLYCERIN , ondansetron  (ZOFRAN ) IV, polyethylene glycol  Antibiotics: Anti-infectives (From admission, onward)    Start     Dose/Rate Route Frequency Ordered Stop   02/12/24 1200  Ampicillin -Sulbactam (UNASYN ) 3 g in sodium chloride  0.9 % 100 mL IVPB        3 g 200 mL/hr over 30 Minutes Intravenous Every 8 hours 02/12/24 1114     02/12/24 1000  Ampicillin -Sulbactam (UNASYN ) 3 g in sodium chloride  0.9 % 100 mL IVPB  Status:  Discontinued        3 g 200 mL/hr over 30 Minutes Intravenous Every 8 hours 02/12/24 0909 02/12/24 0911   02/11/24 0400  ceFAZolin  (ANCEF ) IVPB 2g/100 mL premix  Status:  Discontinued        2 g 200 mL/hr over 30 Minutes Intravenous To Surgery 02/09/24 0947 02/11/24 1232       Objective:  Vital Signs  Vitals:   02/14/24 0447 02/14/24 0512 02/14/24 0744 02/14/24 0811  BP:  (!) 130/59  (!) 127/54  Pulse:  (!) 101  93  Resp:  19  18  Temp:  98 F (36.7 C)  98.9 F (37.2 C)  TempSrc:  Oral  Oral  SpO2:  98% 99% 92%  Weight: 88.5 kg     Height:        Intake/Output Summary (Last 24 hours) at 02/14/2024 1024 Last data filed at 02/14/2024 0645 Gross per 24 hour  Intake 600 ml  Output 1400 ml  Net -800 ml   Filed Weights   02/12/24 0458 02/13/24 0442 02/14/24 0447  Weight: 89 kg 89.2 kg 88.5 kg    General appearance: Awake alert.  In no distress.  Mildly distracted Resp: Normal effort at rest.  Coarse breath sounds bilaterally.  Few crackles in the bases.  No wheezing or rhonchi. Cardio: S1-S2 is normal regular.  No S3-S4.  No rubs murmurs or bruit GI:  Abdomen is soft.  Nontender nondistended.  Bowel sounds are present normal.  No masses organomegaly Extremities: No edema.  Moving all of his extremities. No obvious focal neurological deficits.   Lab Results:  Data Reviewed: I have personally reviewed following labs and reports of the imaging studies  CBC: Recent Labs  Lab 02/10/24 0611 02/11/24 0415 02/12/24 0522 02/13/24 0424 02/14/24 0519  WBC 8.0 10.1 16.2* 11.8* 11.8*  HGB 12.9* 12.5* 14.3 12.6* 13.2  HCT 37.3* 37.0* 43.0 38.2* 39.9  MCV 94.0 95.4 99.3 98.7 98.3  PLT 153 167 186 154 188    Basic Metabolic Panel: Recent Labs  Lab 02/10/24 0611 02/11/24 0414 02/11/24 0415 02/12/24 0522 02/13/24 0424 02/14/24 9480  NA 141  --  140 141 142 143  K 3.9  --  3.6 4.4 3.7 4.0  CL 100  --  99 101 102 102  CO2 31  --  27 26 32 32  GLUCOSE 115*  --  111* 130* 92 117*  BUN 21  --  22 26* 33* 37*  CREATININE 1.28*  --  1.41* 1.49* 1.17 1.16  CALCIUM  9.9  --  10.0 10.0 9.3 9.6  MG  --  1.7  --   --   --  2.0  PHOS  --   --   --   --   --  3.1    GFR: Estimated Creatinine Clearance: 50.3 mL/min (by C-G formula based on SCr of 1.16 mg/dL).  Coagulation Profile: Recent Labs  Lab 02/09/24 1047  INR 1.1    BNP (last 3 results) Recent Labs    01/28/24 0930 02/01/24 1042 02/11/24 0414  PROBNP 1,299.0* 2,213.0* 4,279.0*     Recent Results (from the past 240 hours)  Surgical pcr screen     Status: None   Collection Time: 02/09/24  7:21 PM   Specimen: Nasal Mucosa; Nasal Swab  Result Value Ref Range Status   MRSA, PCR NEGATIVE NEGATIVE Final   Staphylococcus aureus NEGATIVE NEGATIVE Final    Comment: (NOTE) The Xpert SA Assay (FDA approved for NASAL specimens in patients 27 years of age and older), is one component of a comprehensive surveillance program. It is not intended to diagnose infection nor to guide or monitor treatment. Performed at [facility], [address], [place], [place] [address]        Radiology Studies: No results found.      LOS: 8 days   Tifanie Gardiner [employer] on [email or web address]  02/14/2024, 10:24 AM   "

## 2024-02-14 NOTE — Progress Notes (Signed)
 Physical Therapy Treatment Patient Details Name: Jacob Montes MRN: [id number] DOB: Jan 11, 1941 Today's Date: 02/14/2024   History of Present Illness 84 y.o. male admitted 02/06/24 with progressive HF and planned TAVR for 1/2. Workup for acute delirium, acute hypoxic respiratory failure, concern for aspiration PNA, acute on chronic CHF. TAVR cancelled. PMH includes CAD (s/p CABG 2009), severe AS (s/p TAVR 2018), HTN, HLD, HFpEF, stroke, asthma, remote PE.   PT Comments  Pt progressing with mobility, demonstrating multiple bouts of transfers and improved standing tolerance with RW and minA; requires increased assist for ADL tasks. Pt initially lethargic, but alert and following simple commands once sitting EOB; immediately falls asleep at end of session. Pt remains limited by generalized weakness, decreased activity tolerance, poor balance strategies/postural reactions and impaired cognition. Pt's wife Lysbeth) present and supportive. Continue to recommend post-acute rehab services (< 3 hrs/day) to maximize functional mobility and independence prior to return home.  Sitting BP 117/46 Standing BP 88/77 (pt c/o dizziness) Post-mobility BP 111/51     If plan is discharge home, recommend the following: A lot of help with walking and/or transfers;A lot of help with bathing/dressing/bathroom;Assistance with cooking/housework;Direct supervision/assist for medications management;Direct supervision/assist for financial management;Assist for transportation;Help with stairs or ramp for entrance   Can travel by private vehicle     Yes  Equipment Recommendations  Other (comment) (TBD - potential w/c if home)    Recommendations for Other Services  Mobility Specialist     Precautions / Restrictions Precautions Precautions: Fall;Other (comment) Recall of Precautions/Restrictions: Impaired Precaution/Restrictions Comments: bowel urgency; watch SpO2 (wife reports he wears 2L O2 baseline since recent  admissions); watch HR (orthostatic during PT tx 02/14/24) Restrictions Weight Bearing Restrictions Per Provider Order: No     Mobility  Bed Mobility Overal bed mobility: Needs Assistance Bed Mobility: Supine to Sit     Supine to sit: Min assist, HOB elevated, Used rails     General bed mobility comments: minA to initiate BLE mobility towards EOB as pt initially keeping eyes closed; with increased cues, pt waking up and able to reach for bed rail with minA, sidelying>sit with standby assist for balance, scooting self forward with increased time and effort    Transfers Overall transfer level: Needs assistance Equipment used: Rolling walker (2 wheels) Transfers: Sit to/from Stand Sit to Stand: Min assist, Contact guard assist           General transfer comment: mutliple sit<>stands from EOB (3x), BSC (1x) and recliner (7x) to RW; pt unable to stand pulling on RW with BUEs, initial cues for hand placement with intermittent reminders during session; intermittent CGA-minA for stability and walker management    Ambulation/Gait Ambulation/Gait assistance: Contact guard assist Gait Distance (Feet): 6 Feet Assistive device: Rolling walker (2 wheels) Gait Pattern/deviations: Step-to pattern, Trunk flexed, Shuffle Gait velocity: Decreased     General Gait Details: slow, fatigued gait with pivotal steps from bed>recliner>BSC>recliner (~6') with RW and CGA for balance; further ambulation deferred secondary to symptomatic hypotension   Stairs             Wheelchair Mobility     Tilt Bed    Modified Rankin (Stroke Patients Only)       Balance Overall balance assessment: Needs assistance Sitting-balance support: No upper extremity supported, Feet supported, Feet unsupported Sitting balance-Leahy Scale: Fair     Standing balance support: Single extremity supported, Bilateral upper extremity supported, During functional activity, Reliant on assistive device for  balance Standing balance-Leahy Scale: Poor Standing  balance comment: reliant on UE support of RW, requires assist for posterior pericare while standing                            Communication Communication Communication: Impaired Factors Affecting Communication: Hearing impaired;Reduced clarity of speech  Cognition Arousal: Lethargic, Alert Behavior During Therapy: Flat affect   PT - Cognitive impairments: History of cognitive impairments                       PT - Cognition Comments: pt received sleeping, easily awoken but keeping eyes closed when answering questions; alert and appropriate once sitting EOB, following simple commands and short answers to questions with delayed responses at times. falling asleep immediately at end of session Following commands: Impaired Following commands impaired: Only follows one step commands consistently, Follows one step commands with increased time    Cueing Cueing Techniques: Verbal cues  Exercises Other Exercises Other Exercises: 5x sit<>stand from recliner (heavy reliance on UE support to push to standing)    General Comments General comments (skin integrity, edema, etc.): pt's wife present and supportive. SpO2 >/95% on 2L O2 [place] with activity (down to 84% on RA); HR 90s. sitting BP 117/46, standing BP 88/77 (pt c/o dizziness), seated post-BSC use BP 125/48, seated-post 5x sit<>stand BP 111/51      Pertinent Vitals/Pain Pain Assessment Pain Assessment: No/denies pain Pain Intervention(s): Monitored during session    Home Living                          Prior Function            PT Goals (current goals can now be found in the care plan section) Acute Rehab PT Goals Patient Stated Goal: return home PT Goal Formulation: With patient/family Time For Goal Achievement: 02/28/24 Potential to Achieve Goals: Good Progress towards PT goals: Progressing toward goals;Goals updated    Frequency    Min  3X/week      PT Plan      Co-evaluation              AM-PAC PT 6 Clicks Mobility   Outcome Measure  Help needed turning from your back to your side while in a flat bed without using bedrails?: A Little Help needed moving from lying on your back to sitting on the side of a flat bed without using bedrails?: A Little Help needed moving to and from a bed to a chair (including a wheelchair)?: A Little Help needed standing up from a chair using your arms (e.g., wheelchair or bedside chair)?: A Little Help needed to walk in hospital room?: A Lot Help needed climbing 3-5 steps with a railing? : Total 6 Click Score: 15    End of Session Equipment Utilized During Treatment: Gait belt;Oxygen Activity Tolerance: Patient tolerated treatment well Patient left: in chair;with call bell/phone within reach;with chair alarm set;with family/visitor present Nurse Communication: Mobility status PT Visit Diagnosis: Unsteadiness on feet (R26.81);Other abnormalities of gait and mobility (R26.89);Muscle weakness (generalized) (M62.81);Difficulty in walking, not elsewhere classified (R26.2)     Time: 9144-9073 PT Time Calculation (min) (ACUTE ONLY): 31 min  Charges:    $Therapeutic Activity: 23-37 mins PT General Charges $$ ACUTE PT VISIT: 1 Visit                      Darice Almas, PT, DPT Acute Rehabilitation Services  Personal: Secure Chat Rehab Office: [phone]  Darice LITTIE Almas 02/14/2024, 10:06 AM

## 2024-02-15 DIAGNOSIS — J690 Pneumonitis due to inhalation of food and vomit: Secondary | ICD-10-CM | POA: Diagnosis not present

## 2024-02-15 LAB — BASIC METABOLIC PANEL WITH GFR
Anion gap: 7 (ref 5–15)
BUN: 36 mg/dL — ABNORMAL HIGH (ref 8–23)
CO2: 34 mmol/L — ABNORMAL HIGH (ref 22–32)
Calcium: 9.5 mg/dL (ref 8.9–10.3)
Chloride: 105 mmol/L (ref 98–111)
Creatinine, Ser: 1.12 mg/dL (ref 0.61–1.24)
GFR, Estimated: >60 mL/min
Glucose, Bld: 104 mg/dL — ABNORMAL HIGH (ref 70–99)
Potassium: 3.5 mmol/L (ref 3.5–5.1)
Sodium: 145 mmol/L (ref 135–145)

## 2024-02-15 LAB — CBC
HCT: 38.6 % — ABNORMAL LOW (ref 39.0–52.0)
Hemoglobin: 12.9 g/dL — ABNORMAL LOW (ref 13.0–17.0)
MCH: 32.8 pg (ref 26.0–34.0)
MCHC: 33.4 g/dL (ref 30.0–36.0)
MCV: 98.2 fL (ref 80.0–100.0)
Platelets: 170 K/uL (ref 150–400)
RBC: 3.93 MIL/uL — ABNORMAL LOW (ref 4.22–5.81)
RDW: 14.4 % (ref 11.5–15.5)
WBC: 9.7 K/uL (ref 4.0–10.5)
nRBC: 0 % (ref 0.0–0.2)

## 2024-02-15 MED ORDER — GUAIFENESIN ER 600 MG PO TB12
600.0000 mg | ORAL_TABLET | Freq: Two times a day (BID) | ORAL | Status: DC
  Administered 2024-02-15 – 2024-02-17 (×5): 600 mg via ORAL
  Filled 2024-02-15 (×5): qty 1

## 2024-02-15 MED ORDER — FUROSEMIDE 40 MG PO TABS
80.0000 mg | ORAL_TABLET | Freq: Every day | ORAL | Status: DC
  Administered 2024-02-15: 80 mg via ORAL
  Filled 2024-02-15: qty 2

## 2024-02-15 MED ORDER — POTASSIUM CHLORIDE CRYS ER 20 MEQ PO TBCR
40.0000 meq | EXTENDED_RELEASE_TABLET | Freq: Once | ORAL | Status: AC
  Administered 2024-02-15: 40 meq via ORAL
  Filled 2024-02-15: qty 2

## 2024-02-15 MED ORDER — GUAIFENESIN 100 MG/5ML PO LIQD: 5.0000 mL | ORAL | Status: DC | PRN

## 2024-02-15 NOTE — Progress Notes (Signed)
 "  [facility] PROGRESS NOTE   Jacob Montes FMW:[id number] DOB: Jan 01, 1941 DOA: 02/06/2024  PCP: [name], MD  Brief History: 36M h/o CAD status post CABG x 3 in June 2009, severe aortic stenosis status post TAVR in December 2018, hypertension, hyperlipidemia, statin intolerance, stroke, carotid stenosis status post left carotid endarterectomy in 2009 complicated by right laryngeal nerve paralysis, HFpEF, nephrolithiasis, remote tobacco abuse, sleep apnea, and remote PE who presented with progressive heart failure iso TAVR valve degeneration w/ TAVR replacement planned 02/11/2023.  Patient noted to be delirious.  Hospital medicine was subsequently consulted for assistance.  Consultants: Cardiology.  Procedures: None yet   Subjective: Patient sitting up in the chair.  Talking to his family.  Ate some breakfast but only minimal amount.  Patient still distracted but denies any complaints this morning.  He still has a cough.  Denies any chest pain shortness of breath.  His wife and son are at the bedside.     Assessment/Plan:  Acute delirium Based on review of previous records it appears that patient has been in the hospital on and off since December 10.  It appears that he was doing well up until 1/1 when he was noted to have mental status changes.  He was noted to be confused and wanted to go home.  No focal neurological deficits were noted at that time.  No reports of falls or injuries. His presentation is consistent with acute delirium in the setting of hospital stay. He had to be restrained as he was pulling on his telemetry leads and his IV access.  Patient was also given Haldol  and Zyprexa . Patient was started on Rozerem . Delirium is almost resolved.  He is still somewhat distracted and slow to respond though. Seen by speech therapy.  Now on a dysphagia 3 diet. PT and OT.  Aspiration pneumonia/leukocytosis/acute respiratory failure with hypoxia Chest x-ray showed right  basilar opacities.  Concern for aspiration while he was in his delirious state.  WBC was also noted to be high.  Patient was started on Unasyn .   Respiratory status is stable.  Still requiring about 3 L of oxygen.  Try to wean down to maintain saturations greater than 90%. Incentive spirometry. Mucinex  may help. Consider changing to oral antibiotics tomorrow if he continues to improve.  Acute on chronic diastolic CHF LVEF was noted to be 50 to 55%. Patient was diuresed.  Volume seems to have improved.  Cardiology is following and managing.  Not noted to be on scheduled diuretics at this time.   Noted to be negative fluid balance.  Weight has decreased.   Furosemide  is on hold.  Noted to be on metoprolol . Cardiology is following.  Not on any other GDMT at this time.    Bioprosthetic AV degeneration Plan was for TAVR procedure on 1/2 however due to delirium this had to be canceled.  Defer to cardiology.   Unclear if cardiology plans to do this procedure later this week.  Acute kidney injury Baseline renal function appears to be normal.  Creatinine was 0.8 early December.  Increase in creatinine noted over the past few days.  Now back to baseline.   Continue to monitor urine output.  Avoid nephrotoxic agents.  UA reviewed. Supplement potassium.  Coronary artery disease status post CABG Stable.  Patient noted to be on aspirin  and beta-blocker.  Also on statin.  History of asthma Stable.    Normocytic anemia Stable.  No evidence of overt bleeding.  Obesity Estimated  body mass index is 31.63 kg/m as calculated from the following:   Height as of this encounter: 5' 6 (1.676 m).   Weight as of this encounter: 88.9 kg.  Goals of care Patient is DNR/DNI.  Seems to be improving.    DVT Prophylaxis: Lovenox  Code Status: DNR Family Communication: Discussed with patient's wife and his son Disposition Plan: SNF when medically stable    Medications: Scheduled:  aspirin  EC  81 mg  Oral Daily   budesonide  (PULMICORT ) nebulizer solution  0.25 mg Nebulization BID   enoxaparin  (LOVENOX ) injection  40 mg Subcutaneous Daily   guaiFENesin   600 mg Oral BID   metoprolol  succinate  25 mg Oral Daily   multivitamin with minerals  1 tablet Oral Daily   pantoprazole   40 mg Oral Q0600   ramelteon   8 mg Oral QHS   rosuvastatin   40 mg Oral Daily   Continuous:  ampicillin -sulbactam (UNASYN ) IV Stopped (02/15/24 0541)    PRN:acetaminophen , guaiFENesin , haloperidol  lactate, ipratropium-albuterol , montelukast , nitroGLYCERIN , ondansetron  (ZOFRAN ) IV, polyethylene glycol  Antibiotics: Anti-infectives (From admission, onward)    Start     Dose/Rate Route Frequency Ordered Stop   02/12/24 1200  Ampicillin -Sulbactam (UNASYN ) 3 g in sodium chloride  0.9 % 100 mL IVPB        3 g 200 mL/hr over 30 Minutes Intravenous Every 8 hours 02/12/24 1114     02/12/24 1000  Ampicillin -Sulbactam (UNASYN ) 3 g in sodium chloride  0.9 % 100 mL IVPB  Status:  Discontinued        3 g 200 mL/hr over 30 Minutes Intravenous Every 8 hours 02/12/24 0909 02/12/24 0911   02/11/24 0400  ceFAZolin  (ANCEF ) IVPB 2g/100 mL premix  Status:  Discontinued        2 g 200 mL/hr over 30 Minutes Intravenous To Surgery 02/09/24 0947 02/11/24 1232       Objective:  Vital Signs  Vitals:   02/14/24 2327 02/15/24 0408 02/15/24 0745 02/15/24 0847  BP: (!) 135/52 (!) 130/52 (!) 126/48   Pulse: 83 79 78 74  Resp: 18 18 18 18   Temp: 98.3 F (36.8 C) 97.6 F (36.4 C) 97.9 F (36.6 C)   TempSrc: Oral Oral Oral   SpO2: 96% 94% 98% 96%  Weight:  88.9 kg    Height:        Intake/Output Summary (Last 24 hours) at 02/15/2024 1106 Last data filed at 02/15/2024 0645 Gross per 24 hour  Intake 540 ml  Output 1050 ml  Net -510 ml   Filed Weights   02/13/24 0442 02/14/24 0447 02/15/24 0408  Weight: 89.2 kg 88.5 kg 88.9 kg    General appearance: Awake alert.  In no distress Resp: Improved effort.  Improved aeration  though she still has coarse breath sounds with crackles at the bases.  No wheezing or rhonchi. Cardio: S1-S2 is normal regular.  No S3-S4.  No rubs murmurs or bruit GI: Abdomen is soft.  Nontender nondistended.  Bowel sounds are present normal.  No masses organomegaly Extremities: No edema.  Improved mobility noted though he still deconditioned. No focal neurological deficits.   Lab Results:  Data Reviewed: I have personally reviewed following labs and reports of the imaging studies  CBC: Recent Labs  Lab 02/11/24 0415 02/12/24 0522 02/13/24 0424 02/14/24 0519 02/15/24 0440  WBC 10.1 16.2* 11.8* 11.8* 9.7  HGB 12.5* 14.3 12.6* 13.2 12.9*  HCT 37.0* 43.0 38.2* 39.9 38.6*  MCV 95.4 99.3 98.7 98.3 98.2  PLT 167 186 154  188 170    Basic Metabolic Panel: Recent Labs  Lab 02/11/24 0414 02/11/24 0415 02/12/24 0522 02/13/24 0424 02/14/24 0519 02/15/24 0440  NA  --  140 141 142 143 145  K  --  3.6 4.4 3.7 4.0 3.5  CL  --  99 101 102 102 105  CO2  --  27 26 32 32 34*  GLUCOSE  --  111* 130* 92 117* 104*  BUN  --  22 26* 33* 37* 36*  CREATININE  --  1.41* 1.49* 1.17 1.16 1.12  CALCIUM   --  10.0 10.0 9.3 9.6 9.5  MG 1.7  --   --   --  2.0  --   PHOS  --   --   --   --  3.1  --     GFR: Estimated Creatinine Clearance: 52.2 mL/min (by C-G formula based on SCr of 1.12 mg/dL).  Coagulation Profile: Recent Labs  Lab 02/09/24 1047  INR 1.1    BNP (last 3 results) Recent Labs    01/28/24 0930 02/01/24 1042 02/11/24 0414  PROBNP 1,299.0* 2,213.0* 4,279.0*     Recent Results (from the past 240 hours)  Surgical pcr screen     Status: None   Collection Time: 02/09/24  7:21 PM   Specimen: Nasal Mucosa; Nasal Swab  Result Value Ref Range Status   MRSA, PCR NEGATIVE NEGATIVE Final   Staphylococcus aureus NEGATIVE NEGATIVE Final    Comment: (NOTE) The Xpert SA Assay (FDA approved for NASAL specimens in patients 38 years of age and older), is one component of a  comprehensive surveillance program. It is not intended to diagnose infection nor to guide or monitor treatment. Performed at [facility], [address], [place], [place] [address]       Radiology Studies: DG Swallowing Func-Speech Pathology Result Date: 02/14/2024 Table formatting from the original result was not included. Modified Barium Swallow Study Patient Details Name: Jacob Montes MRN: [id number] Date of Birth: [birth year] Today's Date: 02/14/2024 HPI/PMH: HPI: 84 yo male presenting to ED 12/28 with AMS. Admitted with progressive HF and planned TAVR 1/2, which was delayed due to ongoing delirium. CXR shows R basilar opacities. 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 PMH includes CAD s/p CABG x3 in June 2009, severe aortic stenosis s/p TAVR December 2018, HTN, HLD, statin intolerance, prior CVA, carotid stenosis s/p L carotid endarterectomy 2009 complicated by R laryngeal nerve paralysis, HFpEF, nephrolithiasis, remote tobacco use Clinical Impression: RLN paralysis and esophageal retention are suspected to increase the likelihood of intermittent aspiration even PTA. Given acute pneumonia and AMS, will proceed more conservatively at first. Recommend advancing to Dys 3 solids and continuing nectar thick liquids. Expect good prognosis to return to thin liquids once respiratory status and mentation are more stable. Discussed results with pt and his spouse. Will f/u. Pt exhibits moderate oropharyngeal dysphagia characterized by mistiming, which is suspected to be primarily impacted by fluctuating mentation. Although laryngeal elevation and epiglottic inversion are complete, one instance of silent aspiration (PAS 8) and more consistent occurrences of frank penetration (PAS 5) happen when the swallow is initiated at the pyriform sinuses, specifically with thin liquids. Swallow initiation occurs earlier with thickened liquids, preventing further airway invasion (PAS  2, considered WFL). Once boluses reach his vocal folds, he has reduced ability to prevent them from progressing further or at ejecting them given history of RLN paralysis. The 13 mm barium tablet was given whole  in puree with diffuse retention throughout the proximal esophagus requiring multiple boluses of puree and nectar thick liquid liquid to clear. DIGEST Swallow Severity Rating*  Safety: 2  Efficiency: 1  Overall Pharyngeal Swallow Severity: 2 (moderate) 1: mild; 2: moderate; 3: severe; 4: profound *The Dynamic Imaging Grade of Swallowing Toxicity is standardized for the head and neck cancer population, however, demonstrates promising clinical applications across populations to standardize the clinical rating of pharyngeal swallow safety and severity. Factors that may increase risk of adverse event in presence of aspiration Noe & Lianne 2021): Factors that may increase risk of adverse event in presence of aspiration Noe & Lianne 2021): Respiratory or GI disease; Limited mobility; Frail or deconditioned; Dependence for feeding and/or oral hygiene Recommendations/Plan: Swallowing Evaluation Recommendations Swallowing Evaluation Recommendations Recommendations: PO diet PO Diet Recommendation: Dysphagia 3 (Mechanical soft); Mildly thick liquids (Level 2, nectar thick) Liquid Administration via: Cup; Straw Medication Administration: Whole meds with puree Supervision: Staff to assist with self-feeding; Intermittent supervision/cueing for swallowing strategies Swallowing strategies  : Minimize environmental distractions; Slow rate; Small bites/sips Postural changes: Position pt fully upright for meals; Stay upright 30-60 min after meals Oral care recommendations: Oral care BID (2x/day) Recommended consults: Consider esophageal assessment Treatment Plan Treatment Plan Treatment recommendations: Therapy as outlined in treatment plan below Follow-up recommendations: Skilled nursing-short term rehab (<3  hours/day) Functional status assessment: Patient has had a recent decline in their functional status and demonstrates the ability to make significant improvements in function in a reasonable and predictable amount of time. Treatment frequency: Min 2x/week Treatment duration: 2 weeks Interventions: Aspiration precaution training; Patient/family education; Trials of upgraded texture/liquids Recommendations Recommendations for follow up therapy are one component of a multi-disciplinary discharge planning process, led by the attending physician.  Recommendations may be updated based on patient status, additional functional criteria and insurance authorization. Assessment: Orofacial Exam: Orofacial Exam Oral Cavity: Oral Hygiene: WFL Oral Cavity - Dentition: Adequate natural dentition Orofacial Anatomy: WFL Oral Motor/Sensory Function: WFL Anatomy: Anatomy: Suspected cervical osteophytes; Prominent cricopharyngeus Boluses Administered: Boluses Administered Boluses Administered: Thin liquids (Level 0); Mildly thick liquids (Level 2, nectar thick); Moderately thick liquids (Level 3, honey thick); Puree; Solid  Oral Impairment Domain: Oral Impairment Domain Lip Closure: Interlabial escape, no progression to anterior lip Tongue control during bolus hold: Cohesive bolus between tongue to palatal seal Bolus preparation/mastication: Timely and efficient chewing and mashing Bolus transport/lingual motion: Brisk tongue motion Oral residue: Residue collection on oral structures Location of oral residue : Floor of mouth; Tongue; Palate Initiation of pharyngeal swallow : Pyriform sinuses  Pharyngeal Impairment Domain: Pharyngeal Impairment Domain Soft palate elevation: No bolus between soft palate (SP)/pharyngeal wall (PW) Laryngeal elevation: Complete superior movement of thyroid  cartilage with complete approximation of arytenoids to epiglottic petiole Anterior hyoid excursion: Complete anterior movement Epiglottic movement:  Complete inversion Laryngeal vestibule closure: Complete, no air/contrast in laryngeal vestibule Pharyngeal stripping wave : Present - complete Pharyngeal contraction (A/P view only): N/A Pharyngoesophageal segment opening: Partial distention/partial duration, partial obstruction of flow Tongue base retraction: Narrow column of contrast or air between tongue base and PPW Pharyngeal residue: Trace residue within or on pharyngeal structures Location of pharyngeal residue: Tongue base; Valleculae; Pyriform sinuses  Esophageal Impairment Domain: Esophageal Impairment Domain Esophageal clearance upright position: Esophageal retention Pill: Pill Consistency administered: Puree Puree: WFL Penetration/Aspiration Scale Score: Penetration/Aspiration Scale Score 1.  Material does not enter airway: Puree; Solid; Pill 2.  Material enters airway, remains ABOVE vocal cords then ejected out: Mildly thick liquids (Level  2, nectar thick); Moderately thick liquids (Level 3, honey thick) 8.  Material enters airway, passes BELOW cords without attempt by patient to eject out (silent aspiration) : Thin liquids (Level 0) Compensatory Strategies: Compensatory Strategies Compensatory strategies: No   General Information: Caregiver present: No  Diet Prior to this Study: Dysphagia 1 (pureed); Mildly thick liquids (Level 2, nectar thick)   Temperature : Normal   Respiratory Status: Increased WOB   Supplemental O2: Nasal cannula (3L)   History of Recent Intubation: No  Behavior/Cognition: Alert; Cooperative; Requires cueing Self-Feeding Abilities: Dependent for feeding Baseline vocal quality/speech: Normal Volitional Cough: Able to elicit Volitional Swallow: Able to elicit Exam Limitations: No limitations Goal Planning: Prognosis for improved oropharyngeal function: Fair Barriers to Reach Goals: Time post onset No data recorded Patient/Family Stated Goal: none stated Consulted and agree with results and recommendations: Patient; Family  member/caregiver Pain: Pain Assessment Pain Assessment: No/denies pain Pain Intervention(s): Monitored during session End of Session: Start Time:SLP Start Time (ACUTE ONLY): 1315 Stop Time: SLP Stop Time (ACUTE ONLY): 1333 Time Calculation:SLP Time Calculation (min) (ACUTE ONLY): 18 min Charges: SLP Evaluations $ SLP Speech Visit: 1 Visit SLP Evaluations $MBS Swallow: 1 Procedure $Swallowing Treatment: 1 Procedure SLP visit diagnosis: SLP Visit Diagnosis: Dysphagia, oropharyngeal phase (R13.12) Past Medical History: Past Medical History: Diagnosis Date  Actinic keratosis   Acute exacerbation of CHF (congestive heart failure) (HCC) 01/19/2024  Aortic stenosis s/p TAVR   a. 01/2017 s/p TAVR - 23mm Sapien; b. 11/2023 Echo: EF 50-55%, GrII DD, nl RV fxn, mild-mod MR, mild Ca2+ MS, Mod AS, AVA 0.84, mean gradient 32 mmHg, mild perivalvular leak; c. 01/2024 Echo: EF 50-55%, no rwma, GrII DD, nl RV fxn, mod dil LA, mildly dil RA, mild MR/MS, sev AS w/ mod perivalvular leak - mean grad , Vmax 4.52 m/s, DVI 0.13.  CAD (coronary artery disease)   a. 07/2007 s/p CABG x 3 [facility] [place]); b. 12/2023 MV: No isch/infarct; c. 01/2024 Cath: LM 95ost/m, LAD 156m, LCX 100ost/p/m, OM3 40, RCA small, 95p, 49m, RPDA fills via collats from LAD. VG->OM1 nl, VG->OM3 nl, LIMA->LAD nl.  Cancer (HCC)   Skin  Carotid artery disease   a. 2009 s/p left sided CEA complicated by RLN parlaysis; b. 02/2023 U/S: no significant dzs.  Cataract   Chronic heart failure with preserved ejection fraction (HFpEF) (HCC)   a. 01/2024 Echo: EF 50-55%, GrII DD, nl RV fxn, mod dil LA, mild MR/MS, severe AS, mod perivalvular leak; b. 01/2024 RHC: RA 3, RV 66/2, PA 55/17 (30), PCWP 20.  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  GERD (gastroesophageal reflux disease)   HLD (hyperlipidemia)   Hyperlipidemia 10/15/2015  Hypertension   Nephrolithiasis 11/16/2018  Pulmonary embolism (HCC) 08/2010  Skin cancer   Sleep apnea    Stroke [facility])  Past Surgical History: Past Surgical History: Procedure Laterality Date  CARDIAC CATHETERIZATION    CARDIAC VALVE REPLACEMENT  01-19-17  Carotid surgery Left 03/08/2007  CHOLECYSTECTOMY  02/16/2018  COLONOSCOPY WITH PROPOFOL  N/A 09/01/2022  Procedure: COLONOSCOPY WITH PROPOFOL ;  Surgeon: Maryruth Ole DASEN, MD;  Location: [facility] ENDOSCOPY;  Service: Endoscopy;  Laterality: N/A;  CORONARY ARTERY BYPASS GRAFT    ENDOSCOPIC RETROGRADE CHOLANGIOPANCREATOGRAPHY (ERCP) WITH PROPOFOL  N/A 07/10/2021  Procedure: ENDOSCOPIC RETROGRADE CHOLANGIOPANCREATOGRAPHY (ERCP) WITH PROPOFOL ;  Surgeon: Jinny Carmine, MD;  Location: [facility] ENDOSCOPY;  Service: Endoscopy;  Laterality: N/A;  ERCP N/A 11/18/2018  Procedure: ENDOSCOPIC RETROGRADE CHOLANGIOPANCREATOGRAPHY (ERCP);  Surgeon: Jinny Carmine, MD;  Location: [facility] ENDOSCOPY;  Service: Endoscopy;  Laterality: N/A;  EUS N/A 08/21/2021  Procedure: UPPER ENDOSCOPIC ULTRASOUND (EUS) LINEAR;  Surgeon: Queenie Asberry LABOR, MD;  Location: [facility] ENDOSCOPY;  Service: Gastroenterology;  Laterality: N/A;  [facility]  EYE SURGERY  04-15-99  09-14-99  Heart Bypass N/A 07/2007  HERNIA REPAIR  03-12-88  Not sure of date  left shoulder surgery Left 06/03/2009  POLYPECTOMY  09/01/2022  Procedure: POLYPECTOMY;  Surgeon: Maryruth Ole DASEN, MD;  Location: [facility] ENDOSCOPY;  Service: Endoscopy;;  RIGHT AND LEFT HEART CATH  07/18/2007  right shoulder surgery  2003  RIGHT/LEFT HEART CATH AND CORONARY/GRAFT ANGIOGRAPHY N/A 02/04/2024  Procedure: RIGHT/LEFT HEART CATH AND CORONARY/GRAFT ANGIOGRAPHY;  Surgeon: Darron Deatrice LABOR, MD;  Location: [facility] INVASIVE CV LAB;  Service: Cardiovascular;  Laterality: N/A;  TEE WITHOUT CARDIOVERSION N/A 11/21/2018  Procedure: TRANSESOPHAGEAL ECHOCARDIOGRAM (TEE);  Surgeon: Darron Deatrice LABOR, MD;  Location: [facility] ORS;  Service: Cardiovascular;  Laterality: N/A;  BENAY Damien Blumenthal, M.A., CCC-SLP Speech Language Pathology, Acute Rehabilitation Services Secure Chat preferred  [phone] 02/14/2024, 3:29 PM       LOS: 9 days   Jacob Montes [employer] on [email or web address]  02/15/2024, 11:06 AM   "

## 2024-02-15 NOTE — Progress Notes (Signed)
 Speech Language Pathology Treatment: Dysphagia  Patient Details Name: Jacob Montes MRN: [id number] DOB: [birth year] Today's Date: 02/15/2024 Time: 9055-8997 SLP Time Calculation (min) (ACUTE ONLY): 18 min  Assessment / Plan / Recommendation Clinical Impression  Reviewed MBS with pt's family, who state intake has increased since diet was advanced to Dys 3. Mastication is mildly prolonged with occasional rest breaks needed but he clears his oral cavity completely. A liquid wash also helps clear any residuals. He coughs intermittently with PO intake, which is consistent with MBS previous date and did not represent airway invasion. Provided oral care and discussed starting the [facility] Water  Protocol, which allows patients with dysphagia to drink plain water  (neutral pH and free of bacteria) between meals, potentially improving hydration, oral mucosa, preservation of swallowing musculature, and quality of life, while maintaining safety through strict oral hygiene and swallowing guidelines. Water  is allowed between meals and 30 minutes after meals, but not during meals and only after thorough oral care. Sign posted at [facility].   PLAN: Continue current diet with consideration of esophageal assessment. Allow plain water  between meals with frequent oral care. Pt is much more alert compared to recent visits and if this is sustained, would recommend repeating an MBS to assess readiness to resume thin liquids with potential compensatory strategies. Will f/u.     HPI HPI: 84 yo male presenting to ED 12/28 with AMS. Admitted with progressive HF and planned TAVR 1/2, which was delayed due to ongoing delirium. CXR shows R basilar opacities. 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 PMH includes CAD s/p CABG x3 in June 2009, severe aortic stenosis s/p TAVR December 2018, HTN, HLD, statin intolerance, prior CVA, carotid stenosis s/p L carotid endarterectomy 2009 complicated by R  laryngeal nerve paralysis, HFpEF, nephrolithiasis, remote tobacco use      SLP Plan  MBS        Swallow Evaluation Recommendations   Recommendations: PO diet PO Diet Recommendation: Dysphagia 3 (Mechanical soft);Mildly thick liquids (Level 2, nectar thick) Liquid Administration via: Cup;Straw Medication Administration: Crushed with puree Supervision: Staff to assist with self-feeding;Full supervision/cueing for swallowing strategies Postural changes: Position pt fully upright for meals;Stay upright 30-60 min after meals Oral care recommendations: Oral care BID (2x/day);Oral care before ice chips/water  Recommended consults: Consider esophageal assessment     Recommendations                     Oral care BID;Oral care prior to ice chip/H20   Intermittent Supervision/Assistance Dysphagia, oropharyngeal phase (R13.12)     MBS     Damien Blumenthal, M.A., CCC-SLP Speech Language Pathology, Acute Rehabilitation Services  Secure Chat preferred [phone]   02/15/2024, 10:43 AM

## 2024-02-15 NOTE — Plan of Care (Signed)
" °  Problem: Education: Goal: Knowledge of General Education information will improve Description: Including pain rating scale, medication(s)/side effects and non-pharmacologic comfort measures Outcome: Progressing   Problem: Health Behavior/Discharge Planning: Goal: Ability to manage health-related needs will improve Outcome: Progressing   Problem: Clinical Measurements: Goal: Ability to maintain clinical measurements within normal limits will improve Outcome: Progressing Goal: Will remain free from infection Outcome: Progressing Goal: Diagnostic test results will improve Outcome: Progressing Goal: Respiratory complications will improve Outcome: Progressing Goal: Cardiovascular complication will be avoided Outcome: Progressing   Problem: Activity: Goal: Risk for activity intolerance will decrease Outcome: Progressing   Problem: Nutrition: Goal: Adequate nutrition will be maintained Outcome: Progressing   Problem: Coping: Goal: Level of anxiety will decrease Outcome: Progressing   Problem: Elimination: Goal: Will not experience complications related to bowel motility Outcome: Progressing Goal: Will not experience complications related to urinary retention Outcome: Progressing   Problem: Pain Managment: Goal: General experience of comfort will improve and/or be controlled Outcome: Progressing   Problem: Safety: Goal: Ability to remain free from injury will improve Outcome: Progressing   Problem: Skin Integrity: Goal: Risk for impaired skin integrity will decrease Outcome: Progressing   Problem: Education: Goal: Understanding of cardiac disease, CV risk reduction, and recovery process will improve Outcome: Progressing Goal: Individualized Educational Video(s) Outcome: Progressing   Problem: Activity: Goal: Ability to tolerate increased activity will improve Outcome: Progressing   Problem: Cardiac: Goal: Ability to achieve and maintain adequate cardiovascular  perfusion will improve Outcome: Progressing   Problem: Health Behavior/Discharge Planning: Goal: Ability to safely manage health-related needs after discharge will improve Outcome: Progressing   Problem: Safety: Goal: Non-violent Restraint(s) Outcome: Progressing   "

## 2024-02-15 NOTE — Progress Notes (Addendum)
 "  Progress Note  Patient Name: Jacob Montes Date of Encounter: 02/15/2024 [facility] Cardiologist: Jacob Lunger, MD   Interval Summary    Mentation much improved, sitting up in the chair eating breakfast this morning. Family at bedside.   Vital Signs Vitals:   02/14/24 1703 02/14/24 2012 02/14/24 2327 02/15/24 0408  BP: (!) 120/53 (!) 122/56 (!) 135/52 (!) 130/52  Pulse: 89 83 83 79  Resp: 18 18 18 18   Temp: 97.8 F (36.6 C) 99.5 F (37.5 C) 98.3 F (36.8 C) 97.6 F (36.4 C)  TempSrc: Oral Oral Oral Oral  SpO2: 95% 99% 96% 94%  Weight:    88.9 kg  Height:        Intake/Output Summary (Last 24 hours) at 02/15/2024 0700 Last data filed at 02/15/2024 0412 Gross per 24 hour  Intake 680 ml  Output 1050 ml  Net -370 ml      02/15/2024    4:08 AM 02/14/2024    4:47 AM 02/13/2024    4:42 AM  Last 3 Weights  Weight (lbs) 195 lb 15.8 oz 195 lb 1.7 oz 196 lb 10.4 oz  Weight (kg) 88.9 kg 88.5 kg 89.2 kg      Telemetry/ECG  Sinus Rhythm - Personally Reviewed  Physical Exam  GEN: No acute distress.   Neck: No JVD Cardiac: RRR, 3/6 systolic murmur,  no rubs, or gallops.  Respiratory: Clear to auscultation bilaterally. GI: Soft, nontender, non-distended  MS: No edema  Assessment & Plan   84 year old male with a history of CAD status post CABG x 3 in June 2009, severe aortic stenosis status post TAVR in December 2018, hypertension, hyperlipidemia, statin intolerance, stroke, carotid stenosis status post left carotid endarterectomy in 2009 complicated by right laryngeal nerve paralysis, HFpEF, nephrolithiasis, remote tobacco abuse, sleep apnea, and remote PE '12 who presented with progressive heart failure to [facility] and transferred to [facility] for further evaluation.   Acute on Chronic diastolic HF -- Echocardiogram 87/76/74 showed EF 50-55%, no wall motion abnormalities, grade II DD, mild MR, TAVR valve with severe stenosis and moderate perivalvular leak  -- net - 6.9L,  weights stable at 195lbs  Severe bioprosthetic aortic valve stenosis/degeneration  S/p TAVR 23mm Sapien valve in [place]  01/2017 -- Noted in the outpatient setting to have progressive aortic valve stenosis complicated by progressive dyspnea, volume overload and recurrent admission -- Echo 12/23 with mean aortic valve gradient of 54 mmHg and moderate perivalvular leak -- pending TAV-TAV, timing determined once mentation improved and felt stable to proceed by structural team  CAD s/p CABG '09 -- no chest pain -- [facility] 12/26 wedge of 22, PA 55/17 (30), with 3 of 3 patent grafts no targets for intervention.  -- Continue ASA 81 mg daily, metoprolol  succinate 25 mg daily and crestor  40 mg daily    AKI -- Creatinine 1.50 on 12/31. Lasix  was held and renal function improved. Cr 1.12 today   Delirium/agitation  -- Patient with significant improvement today. Hospitalist consulted 1/1   Aspiration pneumonia  -- Hospitalist following   For questions or updates, please contact [facility] Please consult [email or web address] for contact info under   Signed, Jacob Rummer, NP   Jacob Montes was seen by me today along with Jacob Rummer, NP. I have personally performed an evaluation on this patient.  My findings are as follows: 84 y.o. male  with chronic diastolic CHF, CAD s/p CABG and aortic valve disease s/p TAVR in 2018  admitted with CHF. He is now euvolemic. Cardiac cath with patent grafts. Severe stenosis of the TAVR valve with AI.   Data: EKG(s) and pertinent labs, studies, etc were personally reviewed and interpreted by me:  No EKG today Tele: sinus Otherwise, I agree with data as outlined by the advanced practice provider.  Exam performed by me: Gen: NAD Neck: No JVD Cardiac: RRR with systolic murmur Lungs: coarse BS bases Extremities: Trace LE edema  My Assessment and Plan:   Pt admitted with failure of his TAVR valve with severe AS/moderate AI. He  was volume overloaded but is now euvolemic following diuresis. Planning for valve in valve TAVR but delayed due to altered mental status.  He is much better today. He is conversive and sitting up in the chair.  After discussion with our structural team today, will plan to let him recover from his pneumonia before proceeding with TAVR.  Will restart Lasix  at 60 mg per day Will need planning for SNF.   Signed,  Jacob Cash, MD  02/15/2024 12:02 PM    "

## 2024-02-15 NOTE — TOC Progression Note (Addendum)
 Transition of Care [facility]) - Progression Note    Patient Details  Name: Jacob Montes MRN: [id number] Date of Birth: 1940-10-10  Transition of Care [facility] CM/SW Contact  Isaiah Public, [name] Phone Number: 02/15/2024, 3:44 PM  Clinical Narrative:     CSW spoke with patients spouse at bedside, patient currently resting. Patients spouse confirmed with CSW that patients plan is to go to SNF. CSW provided medicare compare ratings list of patients current SNF bed offers. Patients spouse inquired about [facility] in [place]. CSW informed patients spouse that CSW will follow up with facility's to see if they can offer SNF bed for patient. CSW to follow up with patient and patients spouse on SNF choice. CSW called [facility] and spoke with [facility] in admissions who confirmed she will reach out to [place] with [facility] location to review referral and see if facility can offer SNF bed for patient. CSW spoke with [facility] in [place] admissions who confirmed they are reviewing referral and will follow up with CSW on if they can offer a SNF bed for patient. CSW will continue to follow.  Update- CSW informed patients spouse that [facility] in [place] extended a SNF bed offer. Patients spouse informed CSW she is going to discuss with patient and family and give CSW a call back tomorrow with patients SNF choice.  Expected Discharge Plan:  (TBD agreeable to fax out for SNF follow up on place closer to being ready) Barriers to Discharge: Continued Medical Work up               Expected Discharge Plan and Services In-house Referral: Clinical Social Work Discharge Planning Services: CM Consult Post Acute Care Choice: NA Living arrangements for the past 2 months: Single Family Home                   DME Agency: NA       HH Arranged: NA           Social Drivers of Health (SDOH) Interventions SDOH Screenings   Food Insecurity: No Food Insecurity  (02/06/2024)  Housing: Low Risk (02/06/2024)  Transportation Needs: No Transportation Needs (02/06/2024)  Utilities: Not At Risk (02/06/2024)  Alcohol Screen: Low Risk (07/03/2021)  Depression (PHQ2-9): Low Risk (10/23/2021)  Financial Resource Strain: Low Risk  (12/02/2023)   Received from [facility]  Physical Activity: Inactive (12/02/2023)   Received from [facility]  Social Connections: Socially Integrated (02/06/2024)  Stress: No Stress Concern Present (11/05/2023)   Received from [facility]  Tobacco Use: Medium Risk (02/07/2024)  Health Literacy: Adequate Health Literacy (11/05/2023)   [facility] from [facility]    Readmission Risk Interventions     No data to display

## 2024-02-16 ENCOUNTER — Inpatient Hospital Stay (HOSPITAL_COMMUNITY)

## 2024-02-16 DIAGNOSIS — I350 Nonrheumatic aortic (valve) stenosis: Secondary | ICD-10-CM | POA: Diagnosis not present

## 2024-02-16 LAB — BASIC METABOLIC PANEL WITH GFR
Anion gap: 7 (ref 5–15)
BUN: 31 mg/dL — ABNORMAL HIGH (ref 8–23)
CO2: 36 mmol/L — ABNORMAL HIGH (ref 22–32)
Calcium: 9.6 mg/dL (ref 8.9–10.3)
Chloride: 102 mmol/L (ref 98–111)
Creatinine, Ser: 1.37 mg/dL — ABNORMAL HIGH (ref 0.61–1.24)
GFR, Estimated: 51 mL/min — ABNORMAL LOW
Glucose, Bld: 89 mg/dL (ref 70–99)
Potassium: 3.6 mmol/L (ref 3.5–5.1)
Sodium: 146 mmol/L — ABNORMAL HIGH (ref 135–145)

## 2024-02-16 LAB — MAGNESIUM: Magnesium: 2 mg/dL (ref 1.7–2.4)

## 2024-02-16 MED ORDER — POTASSIUM CHLORIDE CRYS ER 20 MEQ PO TBCR
40.0000 meq | EXTENDED_RELEASE_TABLET | Freq: Once | ORAL | Status: AC
  Administered 2024-02-16: 40 meq via ORAL
  Filled 2024-02-16: qty 2

## 2024-02-16 MED ORDER — FUROSEMIDE 40 MG PO TABS
60.0000 mg | ORAL_TABLET | Freq: Every day | ORAL | Status: DC
  Administered 2024-02-16: 60 mg via ORAL
  Filled 2024-02-16 (×2): qty 1

## 2024-02-16 NOTE — Progress Notes (Signed)
 Modified Barium Swallow Study  Patient Details  Name: Jacob Montes MRN: [id number] Date of Birth: [birth year]  Today's Date: 02/16/2024  Modified Barium Swallow completed.  Full report located under Chart Review in the Imaging Section.  History of Present Illness 84 yo male presenting to ED 12/28 with AMS. Admitted with progressive HF and planned TAVR 1/2, which was delayed due to ongoing delirium. CXR shows R basilar opacities. 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 PMH includes CAD s/p CABG x3 in June 2009, severe aortic stenosis s/p TAVR December 2018, HTN, HLD, statin intolerance, prior CVA, carotid stenosis s/p L carotid endarterectomy 2009 complicated by R laryngeal nerve paralysis, HFpEF, nephrolithiasis, remote tobacco use   Clinical Impression Given pt's improvements related to mentation and respiratory status, expect better compensation for more chronic factors that increase risk of aspiration (RLN paralysis and esophageal retention). Recommend close monitoring in times of acute illness but otherwise, resume regular diet with thin liquids. Give meds whole with puree. Will f/u at least briefly.   Pt exhibits improved function compared to recent MBS 1/5 secondary to return to baseline mentation. He is able to manage trace oropharyngeal residue with subswallows. Laryngeal closure is complete but there was one instance of sensed aspiration with thin liquids before the swallow when he tipped his head posteriorly but this did not recur in all subsequent trials. Trace penetration occurs with thin and nectar thick liquids, which is considered WFL (PAS 2). The volume increased minimally when taking the 13 mm barium tablet and managing as it was transiently lodged in his valleculae but the tablet and superficial penetrates cleared with subswallows.  Factors that may increase risk of adverse event in presence of aspiration Noe & Lianne 2021): Respiratory or  GI disease;Limited mobility;Frail or deconditioned  Swallow Evaluation Recommendations Recommendations: PO diet PO Diet Recommendation: Regular;Thin liquids (Level 0) Liquid Administration via: Cup Medication Administration: Whole meds with liquid Supervision: Patient able to self-feed;Intermittent supervision/cueing for swallowing strategies Swallowing strategies  : Minimize environmental distractions;Slow rate;Small bites/sips Postural changes: Position pt fully upright for meals;Stay upright 30-60 min after meals Oral care recommendations: Oral care BID (2x/day) Recommended consults: Consider esophageal assessment    Damien Blumenthal, M.A., CCC-SLP Speech Language Pathology, Acute Rehabilitation Services  Secure Chat preferred [phone]  02/16/2024,10:24 AM

## 2024-02-16 NOTE — Progress Notes (Signed)
 OT Cancellation Note  Patient Details Name: Jacob Montes MRN: [id number] DOB: 10-11-1940   Cancelled Treatment:    Reason Eval/Treat Not Completed: Patient at procedure or test/ unavailable (Patient out of room at swallow study)  Lamarr JONETTA Pouch 02/16/2024, 4:15 PM

## 2024-02-16 NOTE — Plan of Care (Signed)
" °  Problem: Education: Goal: Knowledge of General Education information will improve Description: Including pain rating scale, medication(s)/side effects and non-pharmacologic comfort measures Outcome: Progressing   Problem: Health Behavior/Discharge Planning: Goal: Ability to manage health-related needs will improve Outcome: Progressing   Problem: Clinical Measurements: Goal: Ability to maintain clinical measurements within normal limits will improve Outcome: Progressing Goal: Will remain free from infection Outcome: Progressing Goal: Diagnostic test results will improve Outcome: Progressing Goal: Respiratory complications will improve Outcome: Progressing Goal: Cardiovascular complication will be avoided Outcome: Progressing   Problem: Activity: Goal: Risk for activity intolerance will decrease Outcome: Progressing   Problem: Nutrition: Goal: Adequate nutrition will be maintained Outcome: Progressing   Problem: Coping: Goal: Level of anxiety will decrease Outcome: Progressing   Problem: Elimination: Goal: Will not experience complications related to bowel motility Outcome: Progressing Goal: Will not experience complications related to urinary retention Outcome: Progressing   Problem: Pain Managment: Goal: General experience of comfort will improve and/or be controlled Outcome: Progressing   Problem: Safety: Goal: Ability to remain free from injury will improve Outcome: Progressing   Problem: Skin Integrity: Goal: Risk for impaired skin integrity will decrease Outcome: Progressing   Problem: Education: Goal: Understanding of cardiac disease, CV risk reduction, and recovery process will improve Outcome: Progressing Goal: Individualized Educational Video(s) Outcome: Progressing   Problem: Activity: Goal: Ability to tolerate increased activity will improve Outcome: Progressing   Problem: Cardiac: Goal: Ability to achieve and maintain adequate cardiovascular  perfusion will improve Outcome: Progressing   Problem: Health Behavior/Discharge Planning: Goal: Ability to safely manage health-related needs after discharge will improve Outcome: Progressing   Problem: Safety: Goal: Non-violent Restraint(s) Outcome: Progressing   "

## 2024-02-16 NOTE — Progress Notes (Signed)
 "  [facility] PROGRESS NOTE   Jacob Montes FMW:[id number] DOB: Oct 23, 1940 DOA: 02/06/2024  PCP: [name], MD  Brief History: 26M h/o CAD status post CABG x 3 in June 2009, severe aortic stenosis status post TAVR in December 2018, hypertension, hyperlipidemia, statin intolerance, stroke, carotid stenosis status post left carotid endarterectomy in 2009 complicated by right laryngeal nerve paralysis, HFpEF, nephrolithiasis, remote tobacco abuse, sleep apnea, and remote PE who presented with progressive heart failure iso TAVR valve degeneration w/ TAVR replacement planned 02/11/2023.  Patient noted to be delirious which has since resolved.  Hospital medicine was subsequently consulted for assistance.  Current plan is for skilled nursing facility when she insurance authorization is obtained.  Consultants: Cardiology.    Subjective: No current complaints, ready to get to rehab as he is found out he will have TAVR in a couple weeks   Assessment/Plan:  Acute delirium Based on review of previous records it appears that patient has been in the hospital on and off since December 10.  It appears that he was doing well up until 1/1 when he was noted to have mental status changes.  He was noted to be confused and wanted to go home.  No focal neurological deficits were noted at that time.  No reports of falls or injuries. His presentation is consistent with acute delirium in the setting of hospital stay. He had to be restrained as he was pulling on his telemetry leads and his IV access.  Patient was also given Haldol  and Zyprexa . Patient was started on Rozerem . Delirium is almost resolved Seen by speech therapy-status post modified barium swallow and patient placed on regular with thin liquids PT and OT.  Aspiration pneumonia/leukocytosis/acute respiratory failure with hypoxia Chest x-ray showed right basilar opacities.  Concern for aspiration while he was in his delirious state.  WBC  was also noted to be high.  Patient was started on Unasyn .   Respiratory status is stable.  Still requiring about 3 L of oxygen.  Try to wean down to maintain saturations greater than 90%. Incentive spirometry. Mucinex  may help. -Change to oral antibiotics upon discharge  Acute on chronic diastolic CHF LVEF was noted to be 50 to 55%. Patient was diuresed.  Volume seems to have improved.  Cardiology is following and managing.  Not noted to be on scheduled diuretics at this time.   Noted to be negative fluid balance.  Weight has decreased.   Furosemide  is on hold.  Noted to be on metoprolol . Cardiology is following.  Not on any other GDMT at this time.    Bioprosthetic AV degeneration Plan was for TAVR procedure on 1/2 however due to delirium this had to be canceled.  Defer to cardiology.-Plan for procedure around January 20  Acute kidney injury Baseline renal function appears to be normal.  Creatinine was 0.8 early December.  Increase in creatinine noted over the past few days.  Now back to baseline.   Continue to monitor urine output.  Avoid nephrotoxic agents.  UA reviewed. Supplement potassium.  Coronary artery disease status post CABG Stable.  Patient noted to be on aspirin  and beta-blocker.  Also on statin.  History of asthma Stable.    Normocytic anemia Stable.  No evidence of overt bleeding.  Obesity Estimated body mass index is 32.06 kg/m as calculated from the following:   Height as of this encounter: 5' 6 (1.676 m).   Weight as of this encounter: 90.1 kg.  Goals of care Patient is  DNR/DNI.  Seems to be improving.    DVT Prophylaxis: Lovenox  Code Status: DNR Family Communication: Discussed with patient's wife  Disposition Plan: SNF when bed available    Medications: Scheduled:  aspirin  EC  81 mg Oral Daily   budesonide  (PULMICORT ) nebulizer solution  0.25 mg Nebulization BID   enoxaparin  (LOVENOX ) injection  40 mg Subcutaneous Daily   furosemide   60 mg  Oral Daily   guaiFENesin   600 mg Oral BID   metoprolol  succinate  25 mg Oral Daily   multivitamin with minerals  1 tablet Oral Daily   pantoprazole   40 mg Oral Q0600   ramelteon   8 mg Oral QHS   rosuvastatin   40 mg Oral Daily   Continuous:  ampicillin -sulbactam (UNASYN ) IV Stopped (02/16/24 0547)    PRN:acetaminophen , guaiFENesin , haloperidol  lactate, ipratropium-albuterol , montelukast , nitroGLYCERIN , ondansetron  (ZOFRAN ) IV, polyethylene glycol     Objective:  Vital Signs  Vitals:   02/15/24 1954 02/15/24 2337 02/16/24 0438 02/16/24 0816  BP: (!) 140/57 (!) 121/50 (!) 128/48 (!) 127/40  Pulse:    69  Resp: 18 18 18 17   Temp: 98.3 F (36.8 C) 98.3 F (36.8 C) 97.7 F (36.5 C) 98.3 F (36.8 C)  TempSrc: Axillary Axillary Oral Oral  SpO2:    96%  Weight:   90.1 kg   Height:        Intake/Output Summary (Last 24 hours) at 02/16/2024 1151 Last data filed at 02/16/2024 0645 Gross per 24 hour  Intake 940 ml  Output 1000 ml  Net -60 ml   Filed Weights   02/14/24 0447 02/15/24 0408 02/16/24 0438  Weight: 88.5 kg 88.9 kg 90.1 kg     General: Appearance:    Obese male in no acute distress     Lungs:     respirations unlabored  Heart:    Normal heart rate. Normal rhythm.    MS:   All extremities are intact.   Neurologic:   Awake, alert, oriented x 3. No apparent focal neurological           defect.       Lab Results:  Data Reviewed: I have personally reviewed following labs and reports of the imaging studies  CBC: Recent Labs  Lab 02/11/24 0415 02/12/24 0522 02/13/24 0424 02/14/24 0519 02/15/24 0440  WBC 10.1 16.2* 11.8* 11.8* 9.7  HGB 12.5* 14.3 12.6* 13.2 12.9*  HCT 37.0* 43.0 38.2* 39.9 38.6*  MCV 95.4 99.3 98.7 98.3 98.2  PLT 167 186 154 188 170    Basic Metabolic Panel: Recent Labs  Lab 02/11/24 0414 02/11/24 0415 02/12/24 0522 02/13/24 0424 02/14/24 0519 02/15/24 0440 02/16/24 0427  NA  --    < > 141 142 143 145 146*  K  --    < > 4.4  3.7 4.0 3.5 3.6  CL  --    < > 101 102 102 105 102  CO2  --    < > 26 32 32 34* 36*  GLUCOSE  --    < > 130* 92 117* 104* 89  BUN  --    < > 26* 33* 37* 36* 31*  CREATININE  --    < > 1.49* 1.17 1.16 1.12 1.37*  CALCIUM   --    < > 10.0 9.3 9.6 9.5 9.6  MG 1.7  --   --   --  2.0  --  2.0  PHOS  --   --   --   --  3.1  --   --    < > =  values in this interval not displayed.    GFR: Estimated Creatinine Clearance: 42.9 mL/min (A) (by C-G formula based on SCr of 1.37 mg/dL (H)).  Coagulation Profile: No results for input(s): INR, PROTIME in the last 168 hours.   BNP (last 3 results) Recent Labs    01/28/24 0930 02/01/24 1042 02/11/24 0414  PROBNP 1,299.0* 2,213.0* 4,279.0*     Recent Results (from the past 240 hours)  Surgical pcr screen     Status: None   Collection Time: 02/09/24  7:21 PM   Specimen: Nasal Mucosa; Nasal Swab  Result Value Ref Range Status   MRSA, PCR NEGATIVE NEGATIVE Final   Staphylococcus aureus NEGATIVE NEGATIVE Final    Comment: (NOTE) The Xpert SA Assay (FDA approved for NASAL specimens in patients 81 years of age and older), is one component of a comprehensive surveillance program. It is not intended to diagnose infection nor to guide or monitor treatment. Performed at [facility], [address], [place], [place] [address]       Radiology Studies: DG Swallowing Func-Speech Pathology Result Date: 02/16/2024 Table formatting from the original result was not included. Modified Barium Swallow Study Patient Details Name: Jacob Montes MRN: [id number] Date of Birth: 1940/05/28 Today's Date: 02/16/2024 HPI/PMH: HPI: 84 yo male presenting to ED 12/28 with AMS. Admitted with progressive HF and planned TAVR 1/2, which was delayed due to ongoing delirium. CXR shows R basilar opacities. 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 PMH includes CAD s/p CABG x3 in June 2009, severe aortic stenosis s/p TAVR December 2018,  HTN, HLD, statin intolerance, prior CVA, carotid stenosis s/p L carotid endarterectomy 2009 complicated by R laryngeal nerve paralysis, HFpEF, nephrolithiasis, remote tobacco use Clinical Impression: Given pt's improvements related to mentation and respiratory status, expect better compensation for more chronic factors that increase risk of aspiration (RLN paralysis and esophageal retention). Recommend close monitoring in times of acute illness but otherwise, resume regular diet with thin liquids. Give meds whole with puree. Will f/u at least briefly. Pt exhibits improved function compared to recent MBS 1/5 secondary to return to baseline mentation. He is able to manage trace oropharyngeal residue with subswallows. Laryngeal closure is complete but there was one instance of sensed aspiration with thin liquids before the swallow when he tipped his head posteriorly but this did not recur in all subsequent trials. Trace penetration occurs with thin and nectar thick liquids, which is considered WFL (PAS 2). The volume increased minimally when taking the 13 mm barium tablet and managing as it was transiently lodged in his valleculae but the tablet and superficial penetrates cleared with subswallows. Factors that may increase risk of adverse event in presence of aspiration Noe & Lianne 2021): Factors that may increase risk of adverse event in presence of aspiration Noe & Lianne 2021): Respiratory or GI disease; Limited mobility; Frail or deconditioned Recommendations/Plan: Swallowing Evaluation Recommendations Swallowing Evaluation Recommendations Recommendations: PO diet PO Diet Recommendation: Regular; Thin liquids (Level 0) Liquid Administration via: Cup Medication Administration: Whole meds with liquid Supervision: Patient able to self-feed; Intermittent supervision/cueing for swallowing strategies Swallowing strategies  : Minimize environmental distractions; Slow rate; Small bites/sips Postural changes:  Position pt fully upright for meals; Stay upright 30-60 min after meals Oral care recommendations: Oral care BID (2x/day) Recommended consults: Consider esophageal assessment Treatment Plan Treatment Plan Treatment recommendations: Defer treatment plan to SLP at other venue (see follow-up recommendations) Follow-up recommendations: Skilled nursing-short term rehab (<3 hours/day)  Functional status assessment: Patient has had a recent decline in their functional status and demonstrates the ability to make significant improvements in function in a reasonable and predictable amount of time. Recommendations Recommendations for follow up therapy are one component of a multi-disciplinary discharge planning process, led by the attending physician.  Recommendations may be updated based on patient status, additional functional criteria and insurance authorization. Assessment: Orofacial Exam: Orofacial Exam Oral Cavity: Oral Hygiene: WFL Oral Cavity - Dentition: Adequate natural dentition Orofacial Anatomy: WFL Oral Motor/Sensory Function: WFL Anatomy: Anatomy: Suspected cervical osteophytes; Prominent cricopharyngeus Boluses Administered: Boluses Administered Boluses Administered: Thin liquids (Level 0); Mildly thick liquids (Level 2, nectar thick); Moderately thick liquids (Level 3, honey thick); Puree; Solid  Oral Impairment Domain: Oral Impairment Domain Lip Closure: No labial escape Tongue control during bolus hold: Cohesive bolus between tongue to palatal seal Bolus preparation/mastication: Timely and efficient chewing and mashing Bolus transport/lingual motion: Brisk tongue motion Oral residue: Trace residue lining oral structures Location of oral residue : Tongue Initiation of pharyngeal swallow : Pyriform sinuses  Pharyngeal Impairment Domain: Pharyngeal Impairment Domain Soft palate elevation: No bolus between soft palate (SP)/pharyngeal wall (PW) Laryngeal elevation: Complete superior movement of thyroid  cartilage  with complete approximation of arytenoids to epiglottic petiole Anterior hyoid excursion: Complete anterior movement Epiglottic movement: Complete inversion Laryngeal vestibule closure: Complete, no air/contrast in laryngeal vestibule Pharyngeal stripping wave : Present - complete Pharyngeal contraction (A/P view only): N/A Pharyngoesophageal segment opening: Partial distention/partial duration, partial obstruction of flow Tongue base retraction: Trace column of contrast or air between tongue base and PPW Pharyngeal residue: Trace residue within or on pharyngeal structures Location of pharyngeal residue: Tongue base; Valleculae; Pyriform sinuses  Esophageal Impairment Domain: Esophageal Impairment Domain Esophageal clearance upright position: Esophageal retention Pill: Pill Consistency administered: Thin liquids (Level 0) Thin liquids (Level 0): Impaired (see clinical impressions) Penetration/Aspiration Scale Score: Penetration/Aspiration Scale Score 1.  Material does not enter airway: Moderately thick liquids (Level 3, honey thick); Puree; Solid; Pill 2.  Material enters airway, remains ABOVE vocal cords then ejected out: Thin liquids (Level 0); Mildly thick liquids (Level 2, nectar thick) Compensatory Strategies: Compensatory Strategies Compensatory strategies: No   General Information: Caregiver present: No  Diet Prior to this Study: Dysphagia 3 (mechanical soft); Mildly thick liquids (Level 2, nectar thick)   Temperature : Normal   Respiratory Status: WFL   Supplemental O2: Nasal cannula   History of Recent Intubation: No  Behavior/Cognition: Alert; Cooperative; Pleasant mood Self-Feeding Abilities: Able to self-feed Baseline vocal quality/speech: Normal Volitional Cough: Able to elicit Volitional Swallow: Able to elicit No data recorded Goal Planning: Prognosis for improved oropharyngeal function: Fair Barriers to Reach Goals: Time post onset No data recorded Patient/Family Stated Goal: none stated Consulted  and agree with results and recommendations: Patient; Family member/caregiver Pain: Pain Assessment Pain Assessment: No/denies pain End of Session: Start Time:SLP Start Time (ACUTE ONLY): 0930 Stop Time: SLP Stop Time (ACUTE ONLY): 0948 Time Calculation:SLP Time Calculation (min) (ACUTE ONLY): 18 min Charges: SLP Evaluations $ SLP Speech Visit: 1 Visit SLP Evaluations $MBS Swallow: 1 Procedure $Swallowing Treatment: 1 Procedure SLP visit diagnosis: SLP Visit Diagnosis: Dysphagia, oropharyngeal phase (R13.12) Past Medical History: Past Medical History: Diagnosis Date  Actinic keratosis   Acute exacerbation of CHF (congestive heart failure) (HCC) 01/19/2024  Aortic stenosis s/p TAVR   a. 01/2017 s/p TAVR - 23mm Sapien; b. 11/2023 Echo: EF 50-55%, GrII DD, nl RV fxn, mild-mod MR, mild Ca2+ MS, Mod AS, AVA 0.84, mean  gradient 32 mmHg, mild perivalvular leak; c. 01/2024 Echo: EF 50-55%, no rwma, GrII DD, nl RV fxn, mod dil LA, mildly dil RA, mild MR/MS, sev AS w/ mod perivalvular leak - mean grad , Vmax 4.52 m/s, DVI 0.13.  CAD (coronary artery disease)   a. 07/2007 s/p CABG x 3 [facility] [place]); b. 12/2023 MV: No isch/infarct; c. 01/2024 Cath: LM 95ost/m, LAD 17m, LCX 100ost/p/m, OM3 40, RCA small, 95p, 22m, RPDA fills via collats from LAD. VG->OM1 nl, VG->OM3 nl, LIMA->LAD nl.  Cancer (HCC)   Skin  Carotid artery disease   a. 2009 s/p left sided CEA complicated by RLN parlaysis; b. 02/2023 U/S: no significant dzs.  Cataract   Chronic heart failure with preserved ejection fraction (HFpEF) (HCC)   a. 01/2024 Echo: EF 50-55%, GrII DD, nl RV fxn, mod dil LA, mild MR/MS, severe AS, mod perivalvular leak; b. 01/2024 RHC: RA 3, RV 66/2, PA 55/17 (30), PCWP 20.  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  GERD (gastroesophageal reflux disease)   HLD (hyperlipidemia)   Hyperlipidemia 10/15/2015  Hypertension   Nephrolithiasis 11/16/2018  Pulmonary embolism (HCC) 08/2010  Skin cancer    Sleep apnea   Stroke [facility])  Past Surgical History: Past Surgical History: Procedure Laterality Date  CARDIAC CATHETERIZATION    CARDIAC VALVE REPLACEMENT  01-19-17  Carotid surgery Left 03/08/2007  CHOLECYSTECTOMY  02/16/2018  COLONOSCOPY WITH PROPOFOL  N/A 09/01/2022  Procedure: COLONOSCOPY WITH PROPOFOL ;  Surgeon: Maryruth Ole DASEN, MD;  Location: [facility] ENDOSCOPY;  Service: Endoscopy;  Laterality: N/A;  CORONARY ARTERY BYPASS GRAFT    ENDOSCOPIC RETROGRADE CHOLANGIOPANCREATOGRAPHY (ERCP) WITH PROPOFOL  N/A 07/10/2021  Procedure: ENDOSCOPIC RETROGRADE CHOLANGIOPANCREATOGRAPHY (ERCP) WITH PROPOFOL ;  Surgeon: Jinny Carmine, MD;  Location: [facility] ENDOSCOPY;  Service: Endoscopy;  Laterality: N/A;  ERCP N/A 11/18/2018  Procedure: ENDOSCOPIC RETROGRADE CHOLANGIOPANCREATOGRAPHY (ERCP);  Surgeon: Jinny Carmine, MD;  Location: [facility] ENDOSCOPY;  Service: Endoscopy;  Laterality: N/A;  EUS N/A 08/21/2021  Procedure: UPPER ENDOSCOPIC ULTRASOUND (EUS) LINEAR;  Surgeon: Queenie Asberry LABOR, MD;  Location: [facility] ENDOSCOPY;  Service: Gastroenterology;  Laterality: N/A;  [facility]  EYE SURGERY  04-15-99  09-14-99  Heart Bypass N/A 07/2007  HERNIA REPAIR  03-12-88  Not sure of date  left shoulder surgery Left 06/03/2009  POLYPECTOMY  09/01/2022  Procedure: POLYPECTOMY;  Surgeon: Maryruth Ole DASEN, MD;  Location: [facility] ENDOSCOPY;  Service: Endoscopy;;  RIGHT AND LEFT HEART CATH  07/18/2007  right shoulder surgery  2003  RIGHT/LEFT HEART CATH AND CORONARY/GRAFT ANGIOGRAPHY N/A 02/04/2024  Procedure: RIGHT/LEFT HEART CATH AND CORONARY/GRAFT ANGIOGRAPHY;  Surgeon: Darron Deatrice LABOR, MD;  Location: [facility] INVASIVE CV LAB;  Service: Cardiovascular;  Laterality: N/A;  TEE WITHOUT CARDIOVERSION N/A 11/21/2018  Procedure: TRANSESOPHAGEAL ECHOCARDIOGRAM (TEE);  Surgeon: Darron Deatrice LABOR, MD;  Location: [facility] ORS;  Service: Cardiovascular;  Laterality: N/A;  BENAY Damien Blumenthal, M.A., CCC-SLP Speech Language Pathology, Acute Rehabilitation Services Secure  Chat preferred [phone] 02/16/2024, 10:38 AM  DG Swallowing Func-Speech Pathology Result Date: 02/14/2024 Table formatting from the original result was not included. Modified Barium Swallow Study Patient Details Name: Jacob Montes MRN: [id number] Date of Birth: 07-24-1940 Today's Date: 02/14/2024 HPI/PMH: HPI: 84 yo male presenting to ED 12/28 with AMS. Admitted with progressive HF and planned TAVR 1/2, which was delayed due to ongoing delirium. CXR shows R basilar opacities. 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 PMH includes CAD s/p CABG x3 in June 2009, severe aortic stenosis s/p TAVR  December 2018, HTN, HLD, statin intolerance, prior CVA, carotid stenosis s/p L carotid endarterectomy 2009 complicated by R laryngeal nerve paralysis, HFpEF, nephrolithiasis, remote tobacco use Clinical Impression: RLN paralysis and esophageal retention are suspected to increase the likelihood of intermittent aspiration even PTA. Given acute pneumonia and AMS, will proceed more conservatively at first. Recommend advancing to Dys 3 solids and continuing nectar thick liquids. Expect good prognosis to return to thin liquids once respiratory status and mentation are more stable. Discussed results with pt and his spouse. Will f/u. Pt exhibits moderate oropharyngeal dysphagia characterized by mistiming, which is suspected to be primarily impacted by fluctuating mentation. Although laryngeal elevation and epiglottic inversion are complete, one instance of silent aspiration (PAS 8) and more consistent occurrences of frank penetration (PAS 5) happen when the swallow is initiated at the pyriform sinuses, specifically with thin liquids. Swallow initiation occurs earlier with thickened liquids, preventing further airway invasion (PAS 2, considered WFL). Once boluses reach his vocal folds, he has reduced ability to prevent them from progressing further or at ejecting them given history of RLN paralysis.  The 13 mm barium tablet was given whole in puree with diffuse retention throughout the proximal esophagus requiring multiple boluses of puree and nectar thick liquid liquid to clear. DIGEST Swallow Severity Rating*  Safety: 2  Efficiency: 1  Overall Pharyngeal Swallow Severity: 2 (moderate) 1: mild; 2: moderate; 3: severe; 4: profound *The Dynamic Imaging Grade of Swallowing Toxicity is standardized for the head and neck cancer population, however, demonstrates promising clinical applications across populations to standardize the clinical rating of pharyngeal swallow safety and severity. Factors that may increase risk of adverse event in presence of aspiration Noe & Lianne 2021): Factors that may increase risk of adverse event in presence of aspiration Noe & Lianne 2021): Respiratory or GI disease; Limited mobility; Frail or deconditioned; Dependence for feeding and/or oral hygiene Recommendations/Plan: Swallowing Evaluation Recommendations Swallowing Evaluation Recommendations Recommendations: PO diet PO Diet Recommendation: Dysphagia 3 (Mechanical soft); Mildly thick liquids (Level 2, nectar thick) Liquid Administration via: Cup; Straw Medication Administration: Whole meds with puree Supervision: Staff to assist with self-feeding; Intermittent supervision/cueing for swallowing strategies Swallowing strategies  : Minimize environmental distractions; Slow rate; Small bites/sips Postural changes: Position pt fully upright for meals; Stay upright 30-60 min after meals Oral care recommendations: Oral care BID (2x/day) Recommended consults: Consider esophageal assessment Treatment Plan Treatment Plan Treatment recommendations: Therapy as outlined in treatment plan below Follow-up recommendations: Skilled nursing-short term rehab (<3 hours/day) Functional status assessment: Patient has had a recent decline in their functional status and demonstrates the ability to make significant improvements in function in a  reasonable and predictable amount of time. Treatment frequency: Min 2x/week Treatment duration: 2 weeks Interventions: Aspiration precaution training; Patient/family education; Trials of upgraded texture/liquids Recommendations Recommendations for follow up therapy are one component of a multi-disciplinary discharge planning process, led by the attending physician.  Recommendations may be updated based on patient status, additional functional criteria and insurance authorization. Assessment: Orofacial Exam: Orofacial Exam Oral Cavity: Oral Hygiene: WFL Oral Cavity - Dentition: Adequate natural dentition Orofacial Anatomy: WFL Oral Motor/Sensory Function: WFL Anatomy: Anatomy: Suspected cervical osteophytes; Prominent cricopharyngeus Boluses Administered: Boluses Administered Boluses Administered: Thin liquids (Level 0); Mildly thick liquids (Level 2, nectar thick); Moderately thick liquids (Level 3, honey thick); Puree; Solid  Oral Impairment Domain: Oral Impairment Domain Lip Closure: Interlabial escape, no progression to anterior lip Tongue control during bolus hold: Cohesive bolus between tongue to palatal seal Bolus preparation/mastication: Timely  and efficient chewing and mashing Bolus transport/lingual motion: Brisk tongue motion Oral residue: Residue collection on oral structures Location of oral residue : Floor of mouth; Tongue; Palate Initiation of pharyngeal swallow : Pyriform sinuses  Pharyngeal Impairment Domain: Pharyngeal Impairment Domain Soft palate elevation: No bolus between soft palate (SP)/pharyngeal wall (PW) Laryngeal elevation: Complete superior movement of thyroid  cartilage with complete approximation of arytenoids to epiglottic petiole Anterior hyoid excursion: Complete anterior movement Epiglottic movement: Complete inversion Laryngeal vestibule closure: Complete, no air/contrast in laryngeal vestibule Pharyngeal stripping wave : Present - complete Pharyngeal contraction (A/P view only):  N/A Pharyngoesophageal segment opening: Partial distention/partial duration, partial obstruction of flow Tongue base retraction: Narrow column of contrast or air between tongue base and PPW Pharyngeal residue: Trace residue within or on pharyngeal structures Location of pharyngeal residue: Tongue base; Valleculae; Pyriform sinuses  Esophageal Impairment Domain: Esophageal Impairment Domain Esophageal clearance upright position: Esophageal retention Pill: Pill Consistency administered: Puree Puree: WFL Penetration/Aspiration Scale Score: Penetration/Aspiration Scale Score 1.  Material does not enter airway: Puree; Solid; Pill 2.  Material enters airway, remains ABOVE vocal cords then ejected out: Mildly thick liquids (Level 2, nectar thick); Moderately thick liquids (Level 3, honey thick) 8.  Material enters airway, passes BELOW cords without attempt by patient to eject out (silent aspiration) : Thin liquids (Level 0) Compensatory Strategies: Compensatory Strategies Compensatory strategies: No   General Information: Caregiver present: No  Diet Prior to this Study: Dysphagia 1 (pureed); Mildly thick liquids (Level 2, nectar thick)   Temperature : Normal   Respiratory Status: Increased WOB   Supplemental O2: Nasal cannula (3L)   History of Recent Intubation: No  Behavior/Cognition: Alert; Cooperative; Requires cueing Self-Feeding Abilities: Dependent for feeding Baseline vocal quality/speech: Normal Volitional Cough: Able to elicit Volitional Swallow: Able to elicit Exam Limitations: No limitations Goal Planning: Prognosis for improved oropharyngeal function: Fair Barriers to Reach Goals: Time post onset No data recorded Patient/Family Stated Goal: none stated Consulted and agree with results and recommendations: Patient; Family member/caregiver Pain: Pain Assessment Pain Assessment: No/denies pain Pain Intervention(s): Monitored during session End of Session: Start Time:SLP Start Time (ACUTE ONLY): 1315 Stop Time: SLP  Stop Time (ACUTE ONLY): 1333 Time Calculation:SLP Time Calculation (min) (ACUTE ONLY): 18 min Charges: SLP Evaluations $ SLP Speech Visit: 1 Visit SLP Evaluations $MBS Swallow: 1 Procedure $Swallowing Treatment: 1 Procedure SLP visit diagnosis: SLP Visit Diagnosis: Dysphagia, oropharyngeal phase (R13.12) Past Medical History: Past Medical History: Diagnosis Date  Actinic keratosis   Acute exacerbation of CHF (congestive heart failure) (HCC) 01/19/2024  Aortic stenosis s/p TAVR   a. 01/2017 s/p TAVR - 23mm Sapien; b. 11/2023 Echo: EF 50-55%, GrII DD, nl RV fxn, mild-mod MR, mild Ca2+ MS, Mod AS, AVA 0.84, mean gradient 32 mmHg, mild perivalvular leak; c. 01/2024 Echo: EF 50-55%, no rwma, GrII DD, nl RV fxn, mod dil LA, mildly dil RA, mild MR/MS, sev AS w/ mod perivalvular leak - mean grad , Vmax 4.52 m/s, DVI 0.13.  CAD (coronary artery disease)   a. 07/2007 s/p CABG x 3 [facility] [place]); b. 12/2023 MV: No isch/infarct; c. 01/2024 Cath: LM 95ost/m, LAD 1109m, LCX 100ost/p/m, OM3 40, RCA small, 95p, 77m, RPDA fills via collats from LAD. VG->OM1 nl, VG->OM3 nl, LIMA->LAD nl.  Cancer (HCC)   Skin  Carotid artery disease   a. 2009 s/p left sided CEA complicated by RLN parlaysis; b. 02/2023 U/S: no significant dzs.  Cataract   Chronic heart failure with preserved ejection fraction (HFpEF) (HCC)  a. 01/2024 Echo: EF 50-55%, GrII DD, nl RV fxn, mod dil LA, mild MR/MS, severe AS, mod perivalvular leak; b. 01/2024 RHC: RA 3, RV 66/2, PA 55/17 (30), PCWP 20.  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  GERD (gastroesophageal reflux disease)   HLD (hyperlipidemia)   Hyperlipidemia 10/15/2015  Hypertension   Nephrolithiasis 11/16/2018  Pulmonary embolism (HCC) 08/2010  Skin cancer   Sleep apnea   Stroke [facility])  Past Surgical History: Past Surgical History: Procedure Laterality Date  CARDIAC CATHETERIZATION    CARDIAC VALVE REPLACEMENT  01-19-17  Carotid surgery Left 03/08/2007   CHOLECYSTECTOMY  02/16/2018  COLONOSCOPY WITH PROPOFOL  N/A 09/01/2022  Procedure: COLONOSCOPY WITH PROPOFOL ;  Surgeon: Maryruth Ole DASEN, MD;  Location: [facility] ENDOSCOPY;  Service: Endoscopy;  Laterality: N/A;  CORONARY ARTERY BYPASS GRAFT    ENDOSCOPIC RETROGRADE CHOLANGIOPANCREATOGRAPHY (ERCP) WITH PROPOFOL  N/A 07/10/2021  Procedure: ENDOSCOPIC RETROGRADE CHOLANGIOPANCREATOGRAPHY (ERCP) WITH PROPOFOL ;  Surgeon: Jinny Carmine, MD;  Location: [facility] ENDOSCOPY;  Service: Endoscopy;  Laterality: N/A;  ERCP N/A 11/18/2018  Procedure: ENDOSCOPIC RETROGRADE CHOLANGIOPANCREATOGRAPHY (ERCP);  Surgeon: Jinny Carmine, MD;  Location: [facility] ENDOSCOPY;  Service: Endoscopy;  Laterality: N/A;  EUS N/A 08/21/2021  Procedure: UPPER ENDOSCOPIC ULTRASOUND (EUS) LINEAR;  Surgeon: Queenie Asberry LABOR, MD;  Location: [facility] ENDOSCOPY;  Service: Gastroenterology;  Laterality: N/A;  [facility]  EYE SURGERY  04-15-99  09-14-99  Heart Bypass N/A 07/2007  HERNIA REPAIR  03-12-88  Not sure of date  left shoulder surgery Left 06/03/2009  POLYPECTOMY  09/01/2022  Procedure: POLYPECTOMY;  Surgeon: Maryruth Ole DASEN, MD;  Location: [facility] ENDOSCOPY;  Service: Endoscopy;;  RIGHT AND LEFT HEART CATH  07/18/2007  right shoulder surgery  2003  RIGHT/LEFT HEART CATH AND CORONARY/GRAFT ANGIOGRAPHY N/A 02/04/2024  Procedure: RIGHT/LEFT HEART CATH AND CORONARY/GRAFT ANGIOGRAPHY;  Surgeon: Darron Deatrice LABOR, MD;  Location: [facility] INVASIVE CV LAB;  Service: Cardiovascular;  Laterality: N/A;  TEE WITHOUT CARDIOVERSION N/A 11/21/2018  Procedure: TRANSESOPHAGEAL ECHOCARDIOGRAM (TEE);  Surgeon: Darron Deatrice LABOR, MD;  Location: [facility] ORS;  Service: Cardiovascular;  Laterality: N/A;  BENAY Damien Blumenthal, M.A., CCC-SLP Speech Language Pathology, Acute Rehabilitation Services Secure Chat preferred [phone] 02/14/2024, 3:29 PM       LOS: 10 days   Jacob Montes RAYMOND Bowl  [facility] Pager on [email or web address]  02/16/2024, 11:51 AM   "

## 2024-02-16 NOTE — TOC Progression Note (Addendum)
 Transition of Care [facility]) - Progression Note    Patient Details  Name: Jacob Montes MRN: [id number] Date of Birth: 1940/03/22  [facility] CM/SW Contact  Isaiah Public, [name] Phone Number: 02/16/2024, 9:53 AM  Clinical Narrative:     CSW received a call from [place] patients spouse that patient accepted SNF bed offer with [facility]. Therisa in admissions with [facility] confirmed SNF bed for patient. CSW will continue to follow.  Update- CSW request for Natalie CMA to start insurance authorization for SNF for patient.   UpdateGLENWOOD Edelman CMA informed CSW that patients insurance authorization has been started, currently pending Auth ID [id number] .  Update- Auth ID [id number]. Dates 02/16/2024-02/18/2024. NRD 02/18/24. Anna with admission with [facility] confirmed facility can accept patient tomorrow if medically ready. CSW informed MD.  Expected Discharge Plan:  (TBD agreeable to fax out for SNF follow up on place closer to being ready) Barriers to Discharge: Continued Medical Work up               Expected Discharge Plan and Services In-house Referral: Clinical Social Work Discharge Planning Services: CM Consult Post Acute Care Choice: NA Living arrangements for the past 2 months: Single Family Home                   DME Agency: NA       HH Arranged: NA           Social Drivers of Health (SDOH) Interventions SDOH Screenings   Food Insecurity: No Food Insecurity (02/06/2024)  Housing: Low Risk (02/06/2024)  Transportation Needs: No Transportation Needs (02/06/2024)  Utilities: Not At Risk (02/06/2024)  Alcohol Screen: Low Risk (07/03/2021)  Depression (PHQ2-9): Low Risk (10/23/2021)  Financial Resource Strain: Low Risk  (12/02/2023)   Received from [facility]  Physical Activity: Inactive (12/02/2023)   Received from [facility]  Social Connections: Socially Integrated (02/06/2024)  Stress: No  Stress Concern Present (11/05/2023)   Received from [facility]  Tobacco Use: Medium Risk (02/07/2024)  Health Literacy: Adequate Health Literacy (11/05/2023)   Received from [facility]    Readmission Risk Interventions     No data to display

## 2024-02-16 NOTE — Progress Notes (Addendum)
 "  Progress Note  Patient Name: Jacob Montes Date of Encounter: 02/16/2024 [facility] Cardiologist: Evalene Lunger, MD   Interval Summary    Sitting up in the chair, was able to get to the chair and bedside commode independently today. Wife at the bedside.   Vital Signs Vitals:   02/15/24 1954 02/15/24 2337 02/16/24 0438 02/16/24 0816  BP: (!) 140/57 (!) 121/50 (!) 128/48 (!) 127/40  Pulse:    69  Resp: 18 18 18 17   Temp: 98.3 F (36.8 C) 98.3 F (36.8 C) 97.7 F (36.5 C) 98.3 F (36.8 C)  TempSrc: Axillary Axillary Oral Oral  SpO2:    96%  Weight:   90.1 kg   Height:        Intake/Output Summary (Last 24 hours) at 02/16/2024 0854 Last data filed at 02/16/2024 0645 Gross per 24 hour  Intake 1180 ml  Output 1000 ml  Net 180 ml      02/16/2024    4:38 AM 02/15/2024    4:08 AM 02/14/2024    4:47 AM  Last 3 Weights  Weight (lbs) 198 lb 10.2 oz 195 lb 15.8 oz 195 lb 1.7 oz  Weight (kg) 90.1 kg 88.9 kg 88.5 kg      Telemetry/ECG   Sinus Rhythm - Personally Reviewed  Physical Exam  GEN: No acute distress.   Neck: No JVD Cardiac: RRR, 3/6 systolic murmur, no rubs, or gallops.  Respiratory: Clear to auscultation bilaterally. GI: Soft, nontender, non-distended  MS: No edema  Assessment & Plan   84 year old male with a history of CAD status post CABG x 3 in June 2009, severe aortic stenosis status post TAVR in December 2018, hypertension, hyperlipidemia, statin intolerance, stroke, carotid stenosis status post left carotid endarterectomy in 2009 complicated by right laryngeal nerve paralysis, HFpEF, nephrolithiasis, remote tobacco abuse, sleep apnea, and remote PE '12 who presented with progressive heart failure to [facility] and transferred to [facility] for further evaluation.    Acute on Chronic diastolic HF -- Echocardiogram 87/76/74 showed EF 50-55%, no wall motion abnormalities, grade II DD, mild MR, TAVR valve with severe stenosis and moderate perivalvular leak  -- net -  6.6L, weights stable at 198lbs -- has been resumed on lasix , will drop to 60mg  daily today   Severe bioprosthetic aortic valve stenosis/degeneration  S/p TAVR 23mm Sapien valve in [place]  01/2017 -- Noted in the outpatient setting to have progressive aortic valve stenosis complicated by progressive dyspnea, volume overload and recurrent admission -- Echo 12/23 with mean aortic valve gradient of 54 mmHg and moderate perivalvular leak -- pending TAV-TAV, per structural team, will need to recover from PNA prior to proceeding    CAD s/p CABG '09 -- no chest pain -- [facility] 12/26 wedge of 22, PA 55/17 (30), with 3 of 3 patent grafts no targets for intervention.  -- Continue ASA 81 mg daily, metoprolol  succinate 25 mg daily and crestor  40 mg daily    AKI -- Creatinine peaked at 1.50 on 12/31. Resumed on lasix  yesterday, up 1.37 today. Will reduce lasix  to 60mg  daily  Delirium/agitation  -- Patient with significant improvement. Hospitalist consulted 1/1   Aspiration pneumonia  -- Hospitalist following     For questions or updates, please contact  [facility] Please consult [email or web address] for contact info under    Signed, Manuelita Rummer, NP   Giovany Cosby JONELLE Balloon was seen by me today along with Manuelita Rummer, NP. I have personally performed an evaluation on  this patient.  My findings are as follows: 84 y.o. male with chronic diastolic CHF, CAD s/p CABG and aortic valve disease s/p TAVR in 2018 admitted with CHF. He is now euvolemic. Cardiac cath with patent grafts. Severe stenosis of the TAVR valve with AI. We had planned valve in valve TAVR last week but he became delirious and was found to have an aspiration pneumonia.   Data: EKG(s) and pertinent labs, studies, etc were personally reviewed and interpreted by me:  No EKG today Tele: sinus Otherwise, I agree with data as outlined by the advanced practice provider. Labs reviewed by me.   Exam performed by  me: Gen: NAD Neck: No JVD Cardiac: RRR, systolic murmur Lungs: clear bilat Extremities: No LE edema  My Assessment and Plan:  He has failure of his bioprosthetic AVR that was placed in 2018 in [name]. The mechanism of failure is likely due to stent under-expansion. He will need to have valve in valve TAVR for treatment of current valve failure with AS/AI.  He is much improved today with clear mental status.  Will continue po Lasix  today He will need to be discharged to a SNF.  We will arrange follow up to see me in the office over the next 7-10 days and plan TAVR on 02/29/24.  I have reviewed this plan with Dr. Vann, the patient and his wife at the bedside.    Signed,  Lonni Cash, MD  02/16/2024 10:02 AM    "

## 2024-02-16 NOTE — Progress Notes (Signed)
 Physical Therapy Treatment Patient Details Name: Jacob Montes MRN: [id number] DOB: 1940/05/14 Today's Date: 02/16/2024   History of Present Illness 84 y.o. male admitted 02/06/24 with progressive HF and planned TAVR for 1/2. Workup for acute delirium, acute hypoxic respiratory failure, concern for aspiration PNA, acute on chronic CHF. TAVR cancelled. PMH includes CAD (s/p CABG 2009), severe AS (s/p TAVR 2018), HTN, HLD, HFpEF, stroke, asthma, remote PE.    PT Comments  Pt resting in bed on arrival, agreeable to session and demonstrating good progress towards acute goals. Pt BP stable with positional changes and pt able to progress hallway gait with RW for support and grossly CGA for safety. Pt requiring up to min A to complete bed mobility and transfers sit<>stand. Pt received on 1L O2 with SpO2 96% at rest, SpO2 95% on RA at rest, 90% on RA during ambulation, dropping to 84% on RA after gait seated EOB, pt needing cues for pursed lip breathing with pt unable to recover on RA after ~45mins, reapplied 1L and SpO2 increasing to 92% <60 seconds. Pt returning to supine at end of session due to fatigue. Pt continues to benefit from skilled PT services to progress toward functional mobility goals.     If plan is discharge home, recommend the following: A lot of help with walking and/or transfers;A lot of help with bathing/dressing/bathroom;Assistance with cooking/housework;Direct supervision/assist for medications management;Direct supervision/assist for financial management;Assist for transportation;Help with stairs or ramp for entrance   Can travel by private vehicle     Yes  Equipment Recommendations  Other (comment) (TBD - potential w/c if home)    Recommendations for Other Services       Precautions / Restrictions Precautions Precautions: Fall;Other (comment) Recall of Precautions/Restrictions: Impaired Precaution/Restrictions Comments: bowel urgency; watch SpO2 (wife reports he wears 2L O2  baseline since recent admissions); watch HR (orthostatic during PT tx 02/14/24) Restrictions Weight Bearing Restrictions Per Provider Order: No     Mobility  Bed Mobility Overal bed mobility: Needs Assistance Bed Mobility: Supine to Sit, Sit to Supine     Supine to sit: Contact guard, HOB elevated, Used rails Sit to supine: Min assist   General bed mobility comments: able to come to sitting on L EOB with use of bed features and increased time, min A to manage LEs back to bed at end of session    Transfers Overall transfer level: Needs assistance Equipment used: Rolling walker (2 wheels) Transfers: Sit to/from Stand Sit to Stand: Min assist           General transfer comment: good hand placement, standing from EOB at lowest height, min A to boost    Ambulation/Gait Ambulation/Gait assistance: Contact guard assist Gait Distance (Feet): 125 Feet Assistive device: Rolling walker (2 wheels) Gait Pattern/deviations: Trunk flexed, Shuffle, Step-through pattern Gait velocity: Decreased     General Gait Details: steady with RW for support, no overt LOB   Stairs             Wheelchair Mobility     Tilt Bed    Modified Rankin (Stroke Patients Only)       Balance Overall balance assessment: Needs assistance Sitting-balance support: No upper extremity supported, Feet supported, Feet unsupported Sitting balance-Leahy Scale: Good Sitting balance - Comments: pt able to maintain sitting EOB without UE support   Standing balance support: Bilateral upper extremity supported, During functional activity Standing balance-Leahy Scale: Poor Standing balance comment: reliant on UE support  Communication Communication Communication: Impaired Factors Affecting Communication: Hearing impaired;Reduced clarity of speech  Cognition Arousal: Alert Behavior During Therapy: WFL for tasks assessed/performed   PT - Cognitive impairments:  History of cognitive impairments                         Following commands: Impaired      Cueing Cueing Techniques: Verbal cues  Exercises      General Comments General comments (skin integrity, edema, etc.): pt wife present and supportice throughout sesion, BP 107/47 (65) supine, 118/52(72) sitting up EOB, pt recieved on 1L with SpO2 96%, SpO2 95% on RA at rest, 90% on RA during ambulation, dropping to 84% on RA after gait, needing cues for pursed lip breathing to bring up to 90% on 1L      Pertinent Vitals/Pain Pain Assessment Pain Assessment: No/denies pain    Home Living                          Prior Function            PT Goals (current goals can now be found in the care plan section) Acute Rehab PT Goals Patient Stated Goal: return home PT Goal Formulation: With patient/family Time For Goal Achievement: 02/28/24 Progress towards PT goals: Progressing toward goals    Frequency    Min 3X/week      PT Plan      Co-evaluation              AM-PAC PT 6 Clicks Mobility   Outcome Measure  Help needed turning from your back to your side while in a flat bed without using bedrails?: A Little Help needed moving from lying on your back to sitting on the side of a flat bed without using bedrails?: A Little Help needed moving to and from a bed to a chair (including a wheelchair)?: A Little Help needed standing up from a chair using your arms (e.g., wheelchair or bedside chair)?: A Little Help needed to walk in hospital room?: A Little Help needed climbing 3-5 steps with a railing? : Total 6 Click Score: 16    End of Session Equipment Utilized During Treatment: Gait belt;Oxygen Activity Tolerance: Patient tolerated treatment well Patient left: with call bell/phone within reach;with family/visitor present;in bed Nurse Communication: Mobility status PT Visit Diagnosis: Unsteadiness on feet (R26.81);Other abnormalities of gait and mobility  (R26.89);Muscle weakness (generalized) (M62.81);Difficulty in walking, not elsewhere classified (R26.2)     Time: 8479-8456 PT Time Calculation (min) (ACUTE ONLY): 23 min  Charges:    $Gait Training: 8-22 mins $Therapeutic Activity: 8-22 mins PT General Charges $$ ACUTE PT VISIT: 1 Visit                     Glyn Gerads R. PTA Acute Rehabilitation Services Office: [phone]   Therisa CHRISTELLA Boor 02/16/2024, 4:03 PM

## 2024-02-17 DIAGNOSIS — I350 Nonrheumatic aortic (valve) stenosis: Secondary | ICD-10-CM | POA: Diagnosis not present

## 2024-02-17 LAB — BASIC METABOLIC PANEL WITH GFR
Anion gap: 6 (ref 5–15)
BUN: 26 mg/dL — ABNORMAL HIGH (ref 8–23)
CO2: 38 mmol/L — ABNORMAL HIGH (ref 22–32)
Calcium: 9.5 mg/dL (ref 8.9–10.3)
Chloride: 100 mmol/L (ref 98–111)
Creatinine, Ser: 1.43 mg/dL — ABNORMAL HIGH (ref 0.61–1.24)
GFR, Estimated: 49 mL/min — ABNORMAL LOW
Glucose, Bld: 100 mg/dL — ABNORMAL HIGH (ref 70–99)
Potassium: 3.2 mmol/L — ABNORMAL LOW (ref 3.5–5.1)
Sodium: 144 mmol/L (ref 135–145)

## 2024-02-17 MED ORDER — POTASSIUM CHLORIDE CRYS ER 20 MEQ PO TBCR: 20.0000 meq | EXTENDED_RELEASE_TABLET | Freq: Every day | ORAL | Status: AC

## 2024-02-17 MED ORDER — PANTOPRAZOLE SODIUM 40 MG PO TBEC: 40.0000 mg | DELAYED_RELEASE_TABLET | Freq: Every day | ORAL | Status: AC

## 2024-02-17 MED ORDER — POTASSIUM CHLORIDE CRYS ER 20 MEQ PO TBCR
40.0000 meq | EXTENDED_RELEASE_TABLET | ORAL | Status: DC
  Administered 2024-02-17: 40 meq via ORAL
  Filled 2024-02-17: qty 2

## 2024-02-17 MED ORDER — AMOXICILLIN-POT CLAVULANATE 875-125 MG PO TABS: 1.0000 | ORAL_TABLET | Freq: Two times a day (BID) | ORAL | Status: DC

## 2024-02-17 MED ORDER — RAMELTEON 8 MG PO TABS: 8.0000 mg | ORAL_TABLET | Freq: Every day | ORAL | Status: AC

## 2024-02-17 MED ORDER — FUROSEMIDE 40 MG PO TABS: 40.0000 mg | ORAL_TABLET | Freq: Every day | ORAL | Status: AC

## 2024-02-17 MED ORDER — FUROSEMIDE 40 MG PO TABS
40.0000 mg | ORAL_TABLET | Freq: Every day | ORAL | Status: DC
  Administered 2024-02-17: 40 mg via ORAL

## 2024-02-17 NOTE — Discharge Summary (Signed)
 "     Physician Discharge Summary  Jacob Montes FMW:[id number] DOB: 1941-01-19 DOA: 02/06/2024  PCP: [name], MD  Admit date: 02/06/2024 Discharge date: 02/17/2024  Admitted From:  Discharge disposition: SNF   Recommendations for Outpatient Follow-Up:   Close cards follow up for planned TAVR CBC, bmp 1 week Augmentin  x 3 days   Discharge Diagnosis:   Principal Problem:   Severe aortic stenosis    Discharge Condition: Improved.  Diet recommendation:  Regular.  Wound care: None.  Code status: DNR   History of Present Illness:    84M h/o CAD status post CABG x 3 in June 2009, severe aortic stenosis status post TAVR in December 2018, hypertension, hyperlipidemia, statin intolerance, stroke, carotid stenosis status post left carotid endarterectomy in 2009 complicated by right laryngeal nerve paralysis, HFpEF, nephrolithiasis, remote tobacco abuse, sleep apnea, and remote PE who presented with progressive heart failure iso TAVR valve degeneration w/ TAVR replacement planned 02/11/2023.  Patient noted to be delirious which has since resolved.  Hospital medicine was subsequently consulted for assistance.  Current plan is for skilled nursing facility when she insurance authorization is obtained.    Hospital Course by Problem:   Acute delirium Based on review of previous records it appears that patient has been in the hospital on and off since December 10.  It appears that he was doing well up until 1/1 when he was noted to have mental status changes.  He was noted to be confused and wanted to go home.  No focal neurological deficits were noted at that time.  No reports of falls or injuries. His presentation is consistent with acute delirium in the setting of hospital stay. He had to be restrained as he was pulling on his telemetry leads and his IV access.  Patient was also given Haldol  and Zyprexa . Patient was started on Rozerem . Delirium is almost resolved Seen by  speech therapy-status post modified barium swallow and patient placed on regular with thin liquids PT and OT.   Aspiration pneumonia/leukocytosis/acute respiratory failure with hypoxia Chest x-ray showed right basilar opacities.  Concern for aspiration while he was in his delirious state.  WBC was also noted to be high.  Patient was started on Unasyn .   Respiratory status is stable.  Still requiring about 3 L of oxygen.  Try to wean down to maintain saturations greater than 90%. Incentive spirometry. Mucinex  may help. -Change to oral antibiotics upon discharge   Acute on chronic diastolic CHF LVEF was noted to be 50 to 55%. Patient was diuresed.  Volume seems to have improved.  Cardiology is following and managing.  Not noted to be on scheduled diuretics at this time.   Noted to be negative fluid balance.  Weight has decreased.   Cardiology is following.  Not on any other GDMT at this time.     Bioprosthetic AV degeneration Plan was for TAVR procedure on 1/2 however due to delirium this had to be canceled.  Defer to cardiology.-Plan for procedure around January 20   Acute kidney injury Baseline renal function appears to be normal.  Creatinine was 0.8 early December.  Increase in creatinine noted over the past few days-- lasix  being titrated down by cards -follow BMP   Coronary artery disease status post CABG Stable.  Patient noted to be on aspirin  and beta-blocker.  Also on statin.   History of asthma Stable.     Normocytic anemia Stable.  No evidence of overt bleeding.   Obesity  Estimated body mass index is 32.06 kg/m as calculated from the following:   Height as of this encounter: 5' 6 (1.676 m).   Weight as of this encounter: 90.1 kg.   Goals of care Patient is DNR/DNI.  Seems to be improving.     Hypokalemia -repleted    Medical Consultants:   cards   Discharge Exam:   Vitals:   02/17/24 0507 02/17/24 0832  BP: (!) 130/47 (!) 116/40  Pulse: 75 68  Resp:  18 18  Temp: 97.6 F (36.4 C) 97.9 F (36.6 C)  SpO2: 97% 96%   Vitals:   02/16/24 2009 02/17/24 0028 02/17/24 0507 02/17/24 0832  BP: (!) 118/47 (!) 115/45 (!) 130/47 (!) 116/40  Pulse:  70 75 68  Resp: 20 16 18 18   Temp: 98.3 F (36.8 C) 98.1 F (36.7 C) 97.6 F (36.4 C) 97.9 F (36.6 C)  TempSrc: Oral Oral Oral Oral  SpO2: 90% 97% 97% 96%  Weight:   86.6 kg   Height:        General exam: Appears calm and comfortable. Ready to go to rehab   The results of significant diagnostics from this hospitalization (including imaging, microbiology, ancillary and laboratory) are listed below for reference.     Procedures and Diagnostic Studies:   CT CORONARY MORPH W/CTA COR W/SCORE W/CA W/CM &/OR WO/CM Addendum Date: 02/10/2024 ADDENDUM REPORT: 02/10/2024 17:39 EXAM: OVER-READ INTERPRETATION  CT CHEST The following report is an over-read performed by radiologist Dr. Andrea Gasman of [facility] on 02/10/2024. This over-read does not include interpretation of cardiac or coronary anatomy or pathology. The coronary CTA interpretation by the cardiologist is attached. COMPARISON:  Same date chest CTA, reported separately. FINDINGS: Aortic atherosclerosis. TAVR. Bilateral pleural effusions and adjacent atelectasis. Please reference full field of view chest CT for complete assessment. IMPRESSION: 1. Bilateral pleural effusions and adjacent atelectasis. 2. Aortic Atherosclerosis (ICD10-I70.0). 3. Please reference full field of view chest CT performed same day for further assessment. Electronically Signed   By: Andrea Gasman M.D.   On: 02/10/2024 17:39   Result Date: 02/10/2024 CLINICAL DATA:  Aortic Valve pathology with assessment for TAVR EXAM: Cardiac TAVR CT TECHNIQUE: The patient was scanned on a Siemens Force 192 slice scanner. A 120 kV retrospective scan was triggered in the descending thoracic aorta at 111 HU's. Gantry rotation speed was 270 msecs and collimation was .9 mm. No  beta blockade or nitro were given. The 3D data set was reconstructed in 5% intervals of the R-R cycle. Systolic and diastolic phases were analyzed on a dedicated work station using MPR, MIP and VRT modes. The patient received 100 cc of contrast. FINDINGS: Prosthetic Valve: 23 mm Edward Sapien valve Hypoattenuation and degree: Calcification of the noncoronary cusp prosthetic leaflet and base of the left cusp prosthetic leaflet. Valve to coronary distance (left main) - 3 mm. Valve to coronary distance (RCA) - 4 mm. Severe Annular calcification (Calcification into the LVOT). No paravalvular leak. Mitral Valve: Anterior valve leaflet calcification into the intervalvular fibrosa Internal diameter Major annulus diameter: 21 mm Minor annulus diameter: 19 mm Aortic Measurements- 95% phase Sinotubular Junction: 29 mm Ascending Thoracic Aorta: 29 mm Descending Thoracic Aorta: 26 mm Sinus of Valsalva Measurements: Right coronary cusp width: 31 mm Left coronary cusp width: 29 mm Non coronary cusp width: 30 mm Optimum Fluoroscopic Angle for Delivery: LAO 4, CAU 6 Prosthetic Valve- Left Main Coronary angle: RAO 0, CAU 11 Scenes are saved for both a 23  mm Sapien Valve and a 26 mm Sapien Valve (RCA VTC 5 mm, LVOT area 5.44 cm2) Non TAVR Valve Findings: Coronary Arteries: Normal coronary origin. Study not completed with nitroglycerin . Incomplete evaluation-LIMA to LAD is distally patent. 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 No evidence of interatrial communication. Consider left arm Bubble study if clinically indicated. Main Pulmonary artery: Moderate dilation 33 mm. Pulmonary veins: Normal variant right middle vein. Left atrial appendage: Patent. Interatrial septum: No clear communication. Chamber dimensions: Biventricular dilation. Pericardium: No calcification. Extra Cardiac Findings as per separate reporting. Notable artifacts: Incomplete opacification Image quality: Fair IMPRESSION: 1. Bioprosthetic Aortic stenosis.  Findings pertinent to TAVR procedure are detailed above. RECOMMENDATIONS: The proposed cut-off value of 1,651 AU yielded a 93 % sensitivity and 75 % specificity in grading AS severity in patients with classical low-flow, low-gradient AS. Proposed different cut-off values to define severe AS for men and women as 2,065 AU and 1,274 AU, respectively. The joint European and American recommendations for the assessment of AS consider the aortic valve calcium  score as a continuum - a very high calcium  score suggests severe AS and a low calcium  score suggests severe AS is unlikely. Donney VEAR Jarome LULLA Stephen RENETTE, et al. 2017 ESC/EACTS Guidelines for the management of valvular heart disease. Eur Heart J [phone] Coronary artery calcium  (CAC) score is a strong predictor of incident coronary heart disease (CHD) and provides predictive information beyond traditional risk factors. CAC scoring is reasonable to use in the decision to withhold, postpone, or initiate statin therapy in intermediate-risk or selected borderline-risk asymptomatic adults (age 56-75 years and LDL-C >=70 to <190 mg/dL) who do not have diabetes or established atherosclerotic cardiovascular disease (ASCVD).* In intermediate-risk (10-year ASCVD risk >=7.5% to <20%) adults or selected borderline-risk (10-year ASCVD risk >=5% to <7.5%) adults in whom a CAC score is measured for the purpose of making a treatment decision the following recommendations have been made: If CAC = 0, it is reasonable to withhold statin therapy and reassess in 5 to 10 years, as long as higher risk conditions are absent (diabetes mellitus, family history of premature CHD in first degree relatives (males <55 years; females <65 years), cigarette smoking, LDL >=190 mg/dL or other independent risk factors). If CAC is 1 to 99, it is reasonable to initiate statin therapy for patients >=39 years of age. If CAC is >=100 or >=75th percentile, it is reasonable to initiate statin therapy at  any age. Cardiology referral should be considered for patients with CAC scores >=400 or >=75th percentile. *2018 AHA/ACC/AACVPR/AAPA/ABC/ACPM/ADA/AGS/APhA/ASPC/NLA/PCNA Guideline on the Management of Blood Cholesterol: A Report of the American College of Cardiology/American Heart Association Task Force on Clinical Practice Guidelines. J Am Coll Cardiol. 2019;73(24):3168-3209. Mahesh  Chandrasekhar Electronically Signed: By: Stanly Leavens M.D. On: 02/07/2024 14:24   CT Angio Abd/Pel w/ and/or w/o Result Date: 02/07/2024 EXAM: CTA CHEST, ABDOMEN AND PELVIS WITH AND WITHOUT CONTRAST 02/07/2024 12:28:55 PM TECHNIQUE: CTA of the chest was performed with and without the administration of 100 mL of intravenous iohexol  (OMNIPAQUE ) 350 MG/ML injection. CTA of the abdomen and pelvis was performed with and without the administration of 100 mL of intravenous iohexol  (OMNIPAQUE ) 350 MG/ML injection. Multiplanar reformatted images are provided for review. MIP images are provided for review. Automated exposure control, iterative reconstruction, and/or weight based adjustment of the mA/kV was utilized to reduce the radiation dose to as low as reasonably achievable. COMPARISON: 01/18/2024. CT abdomen and pelvis 03/30/2023. CLINICAL HISTORY: Aortic valve replacement (TAVR), pre-op  eval. FINDINGS: VASCULATURE: AORTA: Aortic atherosclerosis. No acute finding. No abdominal aortic aneurysm. No dissection. PULMONARY ARTERIES: While the study was not performed adequately opacify and evaluate the pulmonary arteries, there is the suggestion of a filling defect within a peripheral left lower lobe pulmonary artery, image 206 of series 3. Consider further evaluation with repeat CTA chest performed to adequately evaluate the pulmonary arteries and exclude pulmonary embolus. GREAT VESSELS OF AORTIC ARCH: No acute finding. No dissection. No arterial occlusion or significant stenosis. CELIAC TRUNK: Calcified plaque at the origin of the  celiac artery without significant stenosis. SUPERIOR MESENTERIC ARTERY: Calcified plaque at the origin of the superior mesenteric artery without significant stenosis. INFERIOR MESENTERIC ARTERY: No acute finding. No occlusion or significant stenosis. RENAL ARTERIES: Calcified plaque at the origin of the renal arteries without significant stenosis. ILIAC ARTERIES: No acute finding. No occlusion or significant stenosis. CHEST: MEDIASTINUM: Cardiomegaly. Prior aortic valve repair and CABG. Mildly prominent mediastinal lymph nodes without pathologically enlarged adenopathy. These are likely reactive. The pericardium demonstrates no acute abnormality. LUNGS AND PLEURA: Small bilateral pleural effusions. Bibasilar opacities favored to reflect atelectasis. No pneumothorax. THORACIC BONES AND SOFT TISSUES: No acute bone or soft tissue abnormality. ABDOMEN AND PELVIS: LIVER: The liver is unremarkable. GALLBLADDER AND BILE DUCTS: Prior cholecystectomy. Pneumobilia noted. No biliary ductal dilatation. SPLEEN: The spleen is unremarkable. PANCREAS: The pancreas is unremarkable. ADRENAL GLANDS: Bilateral adrenal glands demonstrate no acute abnormality. KIDNEYS, URETERS AND BLADDER: No stones in the kidneys or ureters. No hydronephrosis. No perinephric or periureteral stranding. Urinary bladder is unremarkable. GI AND BOWEL: Colonic diverticulosis, most pronounced in the sigmoid colon. No active diverticulitis. Normal appendix. Stomach and duodenal sweep demonstrate no acute abnormality. There is no bowel obstruction. No abnormal bowel wall thickening or distension. REPRODUCTIVE: Reproductive organs are unremarkable. PERITONEUM AND RETROPERITONEUM: Prominent bilateral inguinal hernias containing fat. No ascites or free air. LYMPH NODES: No lymphadenopathy. ABDOMINAL BONES AND SOFT TISSUES: No acute abnormality of the bones. No acute soft tissue abnormality. IMPRESSION: 1. Suggestion of a filling defect within a peripheral left  lower lobe pulmonary artery; the study was not performed adequately to opacify and evaluate the pulmonary arteries, and repeat CTA chest is recommended for further evaluation to exclude pulmonary embolus . 2. Cardiomegaly with prior aortic valve repair and CABG. 3. Aortic atherosclerosis with calcified plaque at the origin of the celiac artery, SMA, and renal arteries without significant stenosis. 4. Small bilateral pleural effusions with bibasilar opacities favored to reflect atelectasis. 5. Colonic diverticulosis . No active diverticulitis. Electronically signed by: Franky Crease MD 02/07/2024 01:12 PM EST RP Workstation: [id number]   CT ANGIO CHEST AORTA W/CM & OR WO/CM Result Date: 02/07/2024 EXAM: CTA CHEST, ABDOMEN AND PELVIS WITH AND WITHOUT CONTRAST 02/07/2024 12:28:55 PM TECHNIQUE: CTA of the chest was performed with and without the administration of 100 mL of intravenous iohexol  (OMNIPAQUE ) 350 MG/ML injection. CTA of the abdomen and pelvis was performed with and without the administration of 100 mL of intravenous iohexol  (OMNIPAQUE ) 350 MG/ML injection. Multiplanar reformatted images are provided for review. MIP images are provided for review. Automated exposure control, iterative reconstruction, and/or weight based adjustment of the mA/kV was utilized to reduce the radiation dose to as low as reasonably achievable. COMPARISON: 01/18/2024. CT abdomen and pelvis 03/30/2023. CLINICAL HISTORY: Aortic valve replacement (TAVR), pre-op eval. FINDINGS: VASCULATURE: AORTA: Aortic atherosclerosis. No acute finding. No abdominal aortic aneurysm. No dissection. PULMONARY ARTERIES: While the study was not performed adequately opacify and evaluate the pulmonary arteries, there  is the suggestion of a filling defect within a peripheral left lower lobe pulmonary artery, image 206 of series 3. Consider further evaluation with repeat CTA chest performed to adequately evaluate the pulmonary arteries and exclude pulmonary  embolus. GREAT VESSELS OF AORTIC ARCH: No acute finding. No dissection. No arterial occlusion or significant stenosis. CELIAC TRUNK: Calcified plaque at the origin of the celiac artery without significant stenosis. SUPERIOR MESENTERIC ARTERY: Calcified plaque at the origin of the superior mesenteric artery without significant stenosis. INFERIOR MESENTERIC ARTERY: No acute finding. No occlusion or significant stenosis. RENAL ARTERIES: Calcified plaque at the origin of the renal arteries without significant stenosis. ILIAC ARTERIES: No acute finding. No occlusion or significant stenosis. CHEST: MEDIASTINUM: Cardiomegaly. Prior aortic valve repair and CABG. Mildly prominent mediastinal lymph nodes without pathologically enlarged adenopathy. These are likely reactive. The pericardium demonstrates no acute abnormality. LUNGS AND PLEURA: Small bilateral pleural effusions. Bibasilar opacities favored to reflect atelectasis. No pneumothorax. THORACIC BONES AND SOFT TISSUES: No acute bone or soft tissue abnormality. ABDOMEN AND PELVIS: LIVER: The liver is unremarkable. GALLBLADDER AND BILE DUCTS: Prior cholecystectomy. Pneumobilia noted. No biliary ductal dilatation. SPLEEN: The spleen is unremarkable. PANCREAS: The pancreas is unremarkable. ADRENAL GLANDS: Bilateral adrenal glands demonstrate no acute abnormality. KIDNEYS, URETERS AND BLADDER: No stones in the kidneys or ureters. No hydronephrosis. No perinephric or periureteral stranding. Urinary bladder is unremarkable. GI AND BOWEL: Colonic diverticulosis, most pronounced in the sigmoid colon. No active diverticulitis. Normal appendix. Stomach and duodenal sweep demonstrate no acute abnormality. There is no bowel obstruction. No abnormal bowel wall thickening or distension. REPRODUCTIVE: Reproductive organs are unremarkable. PERITONEUM AND RETROPERITONEUM: Prominent bilateral inguinal hernias containing fat. No ascites or free air. LYMPH NODES: No lymphadenopathy.  ABDOMINAL BONES AND SOFT TISSUES: No acute abnormality of the bones. No acute soft tissue abnormality. IMPRESSION: 1. Suggestion of a filling defect within a peripheral left lower lobe pulmonary artery; the study was not performed adequately to opacify and evaluate the pulmonary arteries, and repeat CTA chest is recommended for further evaluation to exclude pulmonary embolus . 2. Cardiomegaly with prior aortic valve repair and CABG. 3. Aortic atherosclerosis with calcified plaque at the origin of the celiac artery, SMA, and renal arteries without significant stenosis. 4. Small bilateral pleural effusions with bibasilar opacities favored to reflect atelectasis. 5. Colonic diverticulosis . No active diverticulitis. Electronically signed by: Franky Crease MD 02/07/2024 01:12 PM EST RP Workstation: [id number]     Labs:   Basic Metabolic Panel: Recent Labs  Lab 02/11/24 0414 02/11/24 0415 02/13/24 0424 02/14/24 9480 02/15/24 0440 02/16/24 0427 02/17/24 0521  NA  --    < > 142 143 145 146* 144  K  --    < > 3.7 4.0 3.5 3.6 3.2*  CL  --    < > 102 102 105 102 100  CO2  --    < > 32 32 34* 36* 38*  GLUCOSE  --    < > 92 117* 104* 89 100*  BUN  --    < > 33* 37* 36* 31* 26*  CREATININE  --    < > 1.17 1.16 1.12 1.37* 1.43*  CALCIUM   --    < > 9.3 9.6 9.5 9.6 9.5  MG 1.7  --   --  2.0  --  2.0  --   PHOS  --   --   --  3.1  --   --   --    < > = values in  this interval not displayed.   GFR Estimated Creatinine Clearance: 40.4 mL/min (A) (by C-G formula based on SCr of 1.43 mg/dL (H)). Liver Function Tests: No results for input(s): AST, ALT, ALKPHOS, BILITOT, PROT, ALBUMIN in the last 168 hours. No results for input(s): LIPASE, AMYLASE in the last 168 hours. No results for input(s): AMMONIA in the last 168 hours. Coagulation profile No results for input(s): INR, PROTIME in the last 168 hours.  CBC: Recent Labs  Lab 02/11/24 0415 02/12/24 0522 02/13/24 0424  02/14/24 0519 02/15/24 0440  WBC 10.1 16.2* 11.8* 11.8* 9.7  HGB 12.5* 14.3 12.6* 13.2 12.9*  HCT 37.0* 43.0 38.2* 39.9 38.6*  MCV 95.4 99.3 98.7 98.3 98.2  PLT 167 186 154 188 170   Cardiac Enzymes: No results for input(s): CKTOTAL, CKMB, CKMBINDEX, TROPONINI in the last 168 hours. BNP: Invalid input(s): POCBNP CBG: No results for input(s): GLUCAP in the last 168 hours. D-Dimer No results for input(s): DDIMER in the last 72 hours. Hgb A1c No results for input(s): HGBA1C in the last 72 hours. Lipid Profile No results for input(s): CHOL, HDL, LDLCALC, TRIG, CHOLHDL, LDLDIRECT in the last 72 hours. Thyroid  function studies No results for input(s): TSH, T4TOTAL, T3FREE, THYROIDAB in the last 72 hours.  Invalid input(s): FREET3 Anemia work up No results for input(s): VITAMINB12, FOLATE, FERRITIN, TIBC, IRON, RETICCTPCT in the last 72 hours. Microbiology Recent Results (from the past 240 hours)  Surgical pcr screen     Status: None   Collection Time: 02/09/24  7:21 PM   Specimen: Nasal Mucosa; Nasal Swab  Result Value Ref Range Status   MRSA, PCR NEGATIVE NEGATIVE Final   Staphylococcus aureus NEGATIVE NEGATIVE Final    Comment: (NOTE) The Xpert SA Assay (FDA approved for NASAL specimens in patients 41 years of age and older), is one component of a comprehensive surveillance program. It is not intended to diagnose infection nor to guide or monitor treatment. Performed at [facility], [address] [place], [place] [address]      Discharge Instructions:   Discharge Instructions     Diet general   Complete by: As directed    Increase activity slowly   Complete by: As directed       Allergies as of 02/17/2024       Reactions   Atorvastatin Other (See Comments)   aches myalgia myalgia    aches  myalgia   Ramipril Other (See Comments), Cough   cough        Medication List     STOP taking these  medications    amLODipine  10 MG tablet Commonly known as: NORVASC    heparin  25000 UT/250ML infusion   morphine  (PF) 2 MG/ML injection   omeprazole 20 MG capsule Commonly known as: PRILOSEC   ondansetron  4 MG tablet Commonly known as: ZOFRAN    ondansetron  4 MG/2ML Soln injection Commonly known as: ZOFRAN    oxyCODONE  5 MG immediate release tablet Commonly known as: Oxy IR/ROXICODONE    sodium chloride  flush 0.9 % Soln Commonly known as: NS       TAKE these medications    acetaminophen  325 MG tablet Commonly known as: TYLENOL  Take 2 tablets (650 mg total) by mouth every 6 (six) hours as needed for mild pain (pain score 1-3) or fever (or Fever >/= 101).   amoxicillin -clavulanate 875-125 MG tablet Commonly known as: AUGMENTIN  Take 1 tablet by mouth every 12 (twelve) hours.   aspirin  81 MG chewable tablet Chew 81 mg by mouth  daily.   budesonide  0.25 MG/2ML nebulizer solution Commonly known as: PULMICORT  Take 2 mLs (0.25 mg total) by nebulization 2 (two) times daily. What changed:  when to take this reasons to take this   docusate sodium  100 MG capsule Commonly known as: COLACE Take 100 mg by mouth daily as needed for mild constipation.   furosemide  40 MG tablet Commonly known as: LASIX  Take 1 tablet (40 mg total) by mouth daily. Start taking on: February 18, 2024 What changed:  how much to take Another medication with the same name was removed. Continue taking this medication, and follow the directions you see here.   ipratropium-albuterol  0.5-2.5 (3) MG/3ML Soln Commonly known as: DUONEB Take 3 mLs by nebulization 2 (two) times daily.   metoprolol  succinate 25 MG 24 hr tablet Commonly known as: TOPROL -XL Take 1 tablet (25 mg total) by mouth daily. What changed: when to take this   montelukast  10 MG tablet Commonly known as: SINGULAIR  Take 1 tablet (10 mg total) by mouth at bedtime as needed (allergies).   multivitamin with minerals Tabs tablet Take 1  tablet by mouth daily.   pantoprazole  40 MG tablet Commonly known as: PROTONIX  Take 1 tablet (40 mg total) by mouth daily. What changed: when to take this   polyethylene glycol 17 g packet Commonly known as: MIRALAX  / GLYCOLAX  Take 17 g by mouth daily as needed for mild constipation.   potassium chloride  SA 20 MEQ tablet Commonly known as: KLOR-CON  M Take 1 tablet (20 mEq total) by mouth daily.   PROBIOTIC PO Take 1 tablet by mouth daily.   ramelteon  8 MG tablet Commonly known as: ROZEREM  Take 1 tablet (8 mg total) by mouth at bedtime.   rosuvastatin  40 MG tablet Commonly known as: CRESTOR  Take 1 tablet (40 mg total) by mouth daily. What changed: Another medication with the same name was removed. Continue taking this medication, and follow the directions you see here.        Contact information for after-discharge care     Destination     [facility] [facility] .   Service: Skilled Nursing Contact information: [address] [place]  [address] [phone]                      Time coordinating discharge: 45 min  Signed:  Harlene RAYMOND Bowl DO  [facility] 02/17/2024, 9:19 AM      "

## 2024-02-17 NOTE — Progress Notes (Addendum)
 "  Progress Note  Patient Name: Jacob Montes Date of Encounter: 02/17/2024 [facility] Cardiologist: Evalene Lunger, MD   Interval Summary    Sitting up on the side of the bed. Wife at the bedside. Hopeful to DC to rehab today.   Vital Signs Vitals:   02/16/24 1953 02/16/24 2009 02/17/24 0028 02/17/24 0507  BP:  (!) 118/47 (!) 115/45 (!) 130/47  Pulse:   70 75  Resp:  20 16 18   Temp:  98.3 F (36.8 C) 98.1 F (36.7 C) 97.6 F (36.4 C)  TempSrc:  Oral Oral Oral  SpO2: 98% 90% 97% 97%  Weight:    86.6 kg  Height:        Intake/Output Summary (Last 24 hours) at 02/17/2024 0752 Last data filed at 02/17/2024 0500 Gross per 24 hour  Intake 460 ml  Output 1140 ml  Net -680 ml      02/17/2024    5:07 AM 02/16/2024    4:38 AM 02/15/2024    4:08 AM  Last 3 Weights  Weight (lbs) 191 lb 198 lb 10.2 oz 195 lb 15.8 oz  Weight (kg) 86.637 kg 90.1 kg 88.9 kg      Telemetry/ECG   Sinus Rhythm - Personally Reviewed  Physical Exam  GEN: No acute distress.   Neck: No JVD Cardiac: RRR, 3/6 systolic murmur, no rubs, or gallops.  Respiratory: Clear to auscultation bilaterally. GI: Soft, nontender, non-distended  MS: No edema  Assessment & Plan   84 year old male with a history of CAD status post CABG x 3 in June 2009, severe aortic stenosis status post TAVR in December 2018, hypertension, hyperlipidemia, statin intolerance, stroke, carotid stenosis status post left carotid endarterectomy in 2009 complicated by right laryngeal nerve paralysis, HFpEF, nephrolithiasis, remote tobacco abuse, sleep apnea, and remote PE '12 who presented with progressive heart failure to [facility] and transferred to [facility] for further evaluation.    Acute on Chronic diastolic HF -- Echocardiogram 87/76/74 showed EF 50-55%, no wall motion abnormalities, grade II DD, mild MR, TAVR valve with severe stenosis and moderate perivalvular leak  -- net - 7.3L, weights stable/improved  -- has been resumed on lasix ,  reduced to 40mg  daily today   Severe bioprosthetic aortic valve stenosis/degeneration  S/p TAVR 23mm Sapien valve in [place]  01/2017 -- Noted in the outpatient setting to have progressive aortic valve stenosis complicated by progressive dyspnea, volume overload and recurrent admission -- Echo 12/23 with mean aortic valve gradient of 54 mmHg and moderate perivalvular leak -- pending TAV-TAV, per structural team, will need to recover from PNA prior to proceeding. Has been scheduled for follow up and TAVR on 1/20   CAD s/p CABG '09 -- no chest pain -- [facility] 12/26 wedge of 22, PA 55/17 (30), with 3 of 3 patent grafts no targets for intervention.  -- Continue ASA 81 mg daily, metoprolol  succinate 25 mg daily and crestor  40 mg daily    AKI -- Creatinine peaked at 1.50 on 12/31. Resumed on lasix  yesterday, up 1.4 today. Will reduce lasix  to 40mg  daily -- will need repeat labs at visit next week    Delirium/agitation  -- Patient with significant improvement   Aspiration pneumonia  -- per medicine   Hypokalemia -- supp K+   Follow up has been arranged in the office.   For questions or updates, please contact [facility] Please consult [email or web address] for contact info under   Signed, Manuelita Rummer, NP   I  have personally seen and examined this patient. I agree with the assessment and plan as outlined above.   84 y.o. male with chronic diastolic CHF, CAD s/p CABG and aortic valve disease s/p TAVR in 2018 admitted with CHF. He is now euvolemic. Cardiac cath with patent grafts. Severe stenosis of the TAVR valve with AI. We had planned valve in valve TAVR last week but he became delirious and was found to have an aspiration pneumonia.    Data: EKG(s) and pertinent labs, studies, etc were personally reviewed and interpreted by me:  No EKG today Tele: sinus Labs reviewed by me.     Exam performed by me: Gen: NAD Neck: No JVD Cardiac: RRR with systolic  murmur Lungs: clear bilaterally Extremities: no LE edema   My Assessment and Plan:   He has failure of his bioprosthetic AVR that was placed in 2018 in [place]. The mechanism of failure is likely due to stent under-expansion. He will need to have valve in valve TAVR for treatment of current valve failure with AS/AI.  Doing well today.  Continue Lasix  He will be discharged to a SNF.  I will see him in the office next week and discuss planning for TAVR on 02/29/24.   Lonni Cash, MD, [facility] 02/17/2024 11:00 AM     "

## 2024-02-17 NOTE — Care Management Important Message (Signed)
 Important Message  Patient Details  Name: Jacob Montes MRN: [id number] Date of Birth: 1940-12-19   Important Message Given:  Yes - Medicare IM     [name] Sharps 02/17/2024, 11:43 AM

## 2024-02-17 NOTE — TOC Transition Note (Signed)
 Transition of Care [facility]) - Discharge Note   Patient Details  Name: Jacob Montes MRN: [id number] Date of Birth: 1940/10/09  Transition of Care [facility]) CM/SW Contact:  [name], [name] Phone Number: 02/17/2024, 10:12 AM   Clinical Narrative:   Patient will DC to: [facility] Anticipated DC date: 02/17/2024  Family notified: Jacob Montes, Jacob Montes, [phone]   Transport by: [facility]   Per MD patient ready for DC to [facility] SNF. RN to call report prior to discharge [phone] room 503). RN, patient, patient's family, and facility notified of DC. Discharge Summary and FL2 sent to facility. DC packet on chart. Ambulance transport requested for patient 10:10 AM.   CSW will sign off for now as social work intervention is no longer needed. Please consult us  again if new needs arise.      Final next level of care: Skilled Nursing Facility Barriers to Discharge: Barriers Resolved   Patient Goals and CMS Choice Patient states their goals for this hospitalization and ongoing recovery are:: Plan to return home with spouse.   Choice offered to / list presented to : Spouse, Adult Children (spouse  Jacob Montes and son Jacob Montes)      Discharge Placement PASRR number recieved: 02/14/24            Patient chooses bed at: [facility] Patient to be transferred to facility by: [facility] Name of family member notified: Jacob Montes, Emergency Contact  [phone] Patient and family notified of of transfer: 02/17/24  Discharge Plan and Services Additional resources added to the After Visit Summary for   In-house Referral: Clinical Social Work Discharge Planning Services: CM Consult Post Acute Care Choice: NA            DME Agency: NA       HH Arranged: NA          Social Drivers of Health (SDOH) Interventions SDOH Screenings   Food Insecurity: No Food Insecurity (02/06/2024)  Housing: Low Risk (02/06/2024)  Transportation Needs: No  Transportation Needs (02/06/2024)  Utilities: Not At Risk (02/06/2024)  Alcohol Screen: Low Risk (07/03/2021)  Depression (PHQ2-9): Low Risk (10/23/2021)  Financial Resource Strain: Low Risk  (12/02/2023)   Received from [facility]  Physical Activity: Inactive (12/02/2023)   Received from [facility]  Social Connections: Socially Integrated (02/06/2024)  Stress: No Stress Concern Present (11/05/2023)   Received from [facility]  Tobacco Use: Medium Risk (02/07/2024)  Health Literacy: Adequate Health Literacy (11/05/2023)   Received from [facility]     Readmission Risk Interventions     No data to display

## 2024-02-17 NOTE — Plan of Care (Signed)
 " Problem: Education: Goal: Knowledge of General Education information will improve Description: Including pain rating scale, medication(s)/side effects and non-pharmacologic comfort measures 02/17/2024 0837 by Elgin Lonni BRAVO, RN Outcome: Adequate for Discharge 02/17/2024 [phone] by Elgin Lonni BRAVO, RN Outcome: Adequate for Discharge   Problem: Health Behavior/Discharge Planning: Goal: Ability to manage health-related needs will improve 02/17/2024 0837 by Elgin Lonni BRAVO, RN Outcome: Adequate for Discharge 02/17/2024 0837 by Elgin Lonni BRAVO, RN Outcome: Adequate for Discharge   Problem: Clinical Measurements: Goal: Ability to maintain clinical measurements within normal limits will improve 02/17/2024 0837 by Elgin Lonni BRAVO, RN Outcome: Adequate for Discharge 02/17/2024 0837 by Elgin Lonni BRAVO, RN Outcome: Adequate for Discharge Goal: Will remain free from infection 02/17/2024 0837 by Elgin Lonni BRAVO, RN Outcome: Adequate for Discharge 02/17/2024 0837 by Elgin Lonni BRAVO, RN Outcome: Adequate for Discharge Goal: Diagnostic test results will improve 02/17/2024 0837 by Elgin Lonni BRAVO, RN Outcome: Adequate for Discharge 02/17/2024 0837 by Elgin Lonni BRAVO, RN Outcome: Adequate for Discharge Goal: Respiratory complications will improve 02/17/2024 0837 by Elgin Lonni BRAVO, RN Outcome: Adequate for Discharge 02/17/2024 0837 by Elgin Lonni BRAVO, RN Outcome: Adequate for Discharge Goal: Cardiovascular complication will be avoided 02/17/2024 0837 by Elgin Lonni BRAVO, RN Outcome: Adequate for Discharge 02/17/2024 0837 by Elgin Lonni BRAVO, RN Outcome: Adequate for Discharge   Problem: Activity: Goal: Risk for activity intolerance will decrease 02/17/2024 0837 by Elgin Lonni BRAVO, RN Outcome: Adequate for Discharge 02/17/2024 [phone] by Elgin Lonni BRAVO, RN Outcome: Adequate for Discharge   Problem: Nutrition: Goal: Adequate nutrition will be  maintained 02/17/2024 0837 by Elgin Lonni BRAVO, RN Outcome: Adequate for Discharge 02/17/2024 0837 by Elgin Lonni BRAVO, RN Outcome: Adequate for Discharge   Problem: Coping: Goal: Level of anxiety will decrease 02/17/2024 0837 by Elgin Lonni BRAVO, RN Outcome: Adequate for Discharge 02/17/2024 0837 by Elgin Lonni BRAVO, RN Outcome: Adequate for Discharge   Problem: Elimination: Goal: Will not experience complications related to bowel motility 02/17/2024 0837 by Elgin Lonni BRAVO, RN Outcome: Adequate for Discharge 02/17/2024 0837 by Elgin Lonni BRAVO, RN Outcome: Adequate for Discharge Goal: Will not experience complications related to urinary retention 02/17/2024 0837 by Elgin Lonni BRAVO, RN Outcome: Adequate for Discharge 02/17/2024 0837 by Elgin Lonni BRAVO, RN Outcome: Adequate for Discharge   Problem: Pain Managment: Goal: General experience of comfort will improve and/or be controlled 02/17/2024 0837 by Elgin Lonni BRAVO, RN Outcome: Adequate for Discharge 02/17/2024 [phone] by Elgin Lonni BRAVO, RN Outcome: Adequate for Discharge   Problem: Safety: Goal: Ability to remain free from injury will improve 02/17/2024 0837 by Elgin Lonni BRAVO, RN Outcome: Adequate for Discharge 02/17/2024 0837 by Elgin Lonni BRAVO, RN Outcome: Adequate for Discharge   Problem: Skin Integrity: Goal: Risk for impaired skin integrity will decrease 02/17/2024 0837 by Elgin Lonni BRAVO, RN Outcome: Adequate for Discharge 02/17/2024 0837 by Elgin Lonni BRAVO, RN Outcome: Adequate for Discharge   Problem: Education: Goal: Understanding of cardiac disease, CV risk reduction, and recovery process will improve 02/17/2024 0837 by Elgin Lonni BRAVO, RN Outcome: Adequate for Discharge 02/17/2024 0837 by Elgin Lonni BRAVO, RN Outcome: Adequate for Discharge Goal: Individualized Educational Video(s) 02/17/2024 0837 by Elgin Lonni BRAVO, RN Outcome: Adequate for  Discharge 02/17/2024 0837 by Elgin Lonni BRAVO, RN Outcome: Adequate for Discharge   Problem: Activity: Goal: Ability to tolerate increased activity will improve 02/17/2024 0837 by Elgin Lonni BRAVO, RN Outcome: Adequate for Discharge 02/17/2024 [phone] by Elgin Lonni BRAVO, RN Outcome: Adequate for Discharge  Problem: Cardiac: Goal: Ability to achieve and maintain adequate cardiovascular perfusion will improve 02/17/2024 0837 by Elgin Lonni BRAVO, RN Outcome: Adequate for Discharge 02/17/2024 [phone] by Elgin Lonni BRAVO, RN Outcome: Adequate for Discharge   Problem: Health Behavior/Discharge Planning: Goal: Ability to safely manage health-related needs after discharge will improve 02/17/2024 0837 by Elgin Lonni BRAVO, RN Outcome: Adequate for Discharge 02/17/2024 0837 by Elgin Lonni BRAVO, RN Outcome: Adequate for Discharge   Problem: Safety: Goal: Non-violent Restraint(s) 02/17/2024 0837 by Elgin Lonni BRAVO, RN Outcome: Adequate for Discharge 02/17/2024 0837 by Elgin Lonni BRAVO, RN Outcome: Adequate for Discharge   "

## 2024-02-17 NOTE — Progress Notes (Signed)
 Report given by telephone to Edgardo, RN at [facility].  Advised that [facility] has been contacted for transport with no ETA for patient pick-up.  Patient with no active complaints or issues at this time.  Family at bedside.  Will continue to monitor pending discharge to facility.

## 2024-02-18 ENCOUNTER — Telehealth: Payer: Self-pay | Admitting: Cardiovascular Disease

## 2024-02-18 NOTE — Telephone Encounter (Signed)
 Pt's wife states that pt was discharged from the hospital yesterday and sent to a Rehab facility. She would like a c/b regarding pt's discharge from Rehab. Please advise

## 2024-02-18 NOTE — Telephone Encounter (Signed)
 Pt wife, Montes, states he has been released from OT in Rehab, didn't need his walker or his oxygen in Rehab. Asking about d/c and what he is going to do over the weekend to prevent delirium stage. Jacob Montes to follow-up with Rehab facility regarding all of these issues and questions she is having. Will update Dr Verlin that patient is in rehab. Verbalizes plan of care.

## 2024-02-22 ENCOUNTER — Telehealth: Payer: Self-pay | Admitting: Cardiovascular Disease

## 2024-02-22 NOTE — Progress Notes (Signed)
 Patient's wife Dickey called office requesting Dr. Storm opinion on patient using a nebulizer. Per Dr. Bertrum he would recommend patient continue use and to place order for neb for home use. Neb ordered. Dickey was advised.

## 2024-02-22 NOTE — Telephone Encounter (Signed)
 Patient spouse states that the patient was supposed to get a breathing machine and wants to know if she should purchase one for right now plus the patient is taking breathing treatment. Please advise

## 2024-02-22 NOTE — Telephone Encounter (Signed)
 I spoke with patient's wife.  Patient was discharged from SNF yesterday and is currently home. Patient's wife has questions regarding breathing treatments and what type of treatments are needed.  I asked her to contact patient's PCP regarding these questions.

## 2024-02-24 ENCOUNTER — Inpatient Hospital Stay: Admitting: Cardiovascular Disease

## 2024-02-24 ENCOUNTER — Other Ambulatory Visit: Payer: Self-pay

## 2024-02-24 ENCOUNTER — Encounter: Payer: Self-pay | Admitting: Cardiovascular Disease

## 2024-02-24 VITALS — BP 131/65 | HR 71 | Ht 66.0 in | Wt 210.6 lb

## 2024-02-24 DIAGNOSIS — I5033 Acute on chronic diastolic (congestive) heart failure: Secondary | ICD-10-CM | POA: Diagnosis not present

## 2024-02-24 DIAGNOSIS — I350 Nonrheumatic aortic (valve) stenosis: Secondary | ICD-10-CM

## 2024-02-24 DIAGNOSIS — T82857D Stenosis of cardiac prosthetic devices, implants and grafts, subsequent encounter: Secondary | ICD-10-CM

## 2024-02-24 NOTE — Progress Notes (Signed)
 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 He has had multiple recent admissions with heart failure felt to be due to his bioprosthetic AVR failure. He was most recently admitted to [facility] with CHF on 02/01/24 and was transferred to [facility] for management. Echo 02/01/24 with LVEF=50-55%. His bioprosthetic AVR had severe stenosis and moderate perivalvular leak. He was volume overloaded and was diuresed with IV Lasix . Cardiac cath with patent grafts. He developed an aspiration pneumonia and had hospital delirium. We considered inpatient TAVR but this was cancelled due to his delirium. His mental status improved prior to discharge. He was discharged to a SNF.   Based on the presence of an under expanded Sapien valve, the team has reviewed his scans and the plan is to place a Medtronic Evolut valve inside the old Sapien valve. He has femoral access for TAVR.   He tells me today that he feels well overall. He denies chest pain, dyspnea or LE edema. His family states that he is back to his baseline mental status.   Primary Care Physician: [name], MD   Past Medical History:  Diagnosis Date   Actinic keratosis    Acute exacerbation of CHF (congestive heart failure) (HCC) 01/19/2024   Aortic stenosis s/p TAVR    a. 01/2017 s/p TAVR - 23mm Sapien; b. 11/2023 Echo: EF 50-55%, GrII DD, nl RV fxn, mild-mod MR, mild Ca2+ MS, Mod AS, AVA 0.84, mean gradient 32 mmHg, mild perivalvular leak; c. 01/2024 Echo: EF 50-55%, no rwma, GrII DD, nl RV fxn, mod dil LA, mildly dil RA, mild MR/MS, sev AS w/ mod perivalvular leak - mean grad , Vmax 4.52 m/s, DVI 0.13.   CAD (coronary artery disease)    a. 07/2007 s/p CABG x 3 [facility]); b. 12/2023 MV: No isch/infarct; c. 01/2024 Cath: LM 95ost/m, LAD 174m, LCX 100ost/p/m, OM3 40, RCA small, 95p, 56m, RPDA fills via collats from LAD. VG->OM1 nl, VG->OM3 nl, LIMA->LAD nl.   Cancer (HCC)    Skin   Carotid artery disease    a. 2009 s/p left sided CEA complicated by RLN parlaysis; b. 02/2023 U/S: no significant dzs.   Cataract    Chronic heart failure with preserved ejection fraction (HFpEF) (HCC)    a. 01/2024 Echo: EF 50-55%, GrII DD, nl RV fxn, mod dil LA, mild MR/MS, severe AS, mod perivalvular leak; b. 01/2024 RHC: RA 3, RV 66/2, PA 55/17 (30), PCWP 20.   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   GERD (gastroesophageal reflux disease)    HLD (hyperlipidemia)    Hyperlipidemia 10/15/2015   Hypertension    Nephrolithiasis 11/16/2018   Pulmonary embolism (HCC) 08/2010   Skin cancer    Sleep apnea    Stroke [facility])     Past Surgical History:  Procedure Laterality Date   CARDIAC CATHETERIZATION     CARDIAC VALVE REPLACEMENT  01-19-17   Carotid surgery Left 03/08/2007   CHOLECYSTECTOMY  02/16/2018   COLONOSCOPY WITH PROPOFOL  N/A 09/01/2022   Procedure: COLONOSCOPY WITH PROPOFOL ;  Surgeon: Maryruth Ole DASEN, MD;  Location: [facility] ENDOSCOPY;  Service: Endoscopy;  Laterality: N/A;   CORONARY ARTERY BYPASS GRAFT  ENDOSCOPIC RETROGRADE CHOLANGIOPANCREATOGRAPHY (ERCP) WITH PROPOFOL  N/A 07/10/2021   Procedure: ENDOSCOPIC RETROGRADE CHOLANGIOPANCREATOGRAPHY (ERCP) WITH PROPOFOL ;  Surgeon: Jinny Carmine, MD;  Location: [facility] ENDOSCOPY;  Service: Endoscopy;  Laterality: N/A;   ERCP N/A 11/18/2018   Procedure: ENDOSCOPIC RETROGRADE CHOLANGIOPANCREATOGRAPHY (ERCP);  Surgeon: Jinny Carmine, MD;  Location: [facility] ENDOSCOPY;  Service: Endoscopy;  Laterality: N/A;   EUS N/A 08/21/2021   Procedure: UPPER ENDOSCOPIC ULTRASOUND (EUS) LINEAR;  Surgeon: Queenie Asberry LABOR, MD;  Location: [facility] ENDOSCOPY;  Service: Gastroenterology;  Laterality:  N/A;  [facility]   EYE SURGERY  04-15-99   09-14-99   Heart Bypass N/A 07/2007   HERNIA REPAIR  03-12-88   Not sure of date   left shoulder surgery Left 06/03/2009   POLYPECTOMY  09/01/2022   Procedure: POLYPECTOMY;  Surgeon: Maryruth Ole DASEN, MD;  Location: [facility] ENDOSCOPY;  Service: Endoscopy;;   RIGHT AND LEFT HEART CATH  07/18/2007   right shoulder surgery  2003   RIGHT/LEFT HEART CATH AND CORONARY/GRAFT ANGIOGRAPHY N/A 02/04/2024   Procedure: RIGHT/LEFT HEART CATH AND CORONARY/GRAFT ANGIOGRAPHY;  Surgeon: Darron Deatrice LABOR, MD;  Location: [facility] INVASIVE CV LAB;  Service: Cardiovascular;  Laterality: N/A;   TEE WITHOUT CARDIOVERSION N/A 11/21/2018   Procedure: TRANSESOPHAGEAL ECHOCARDIOGRAM (TEE);  Surgeon: Darron Deatrice LABOR, MD;  Location: [facility] ORS;  Service: Cardiovascular;  Laterality: N/A;   TVAR      Current Outpatient Medications  Medication Sig Dispense Refill   acetaminophen  (TYLENOL ) 325 MG tablet Take 2 tablets (650 mg total) by mouth every 6 (six) hours as needed for mild pain (pain score 1-3) or fever (or Fever >/= 101).     aspirin  81 MG chewable tablet Chew 81 mg by mouth daily.     budesonide  (PULMICORT ) 0.25 MG/2ML nebulizer solution Take 2 mLs (0.25 mg total) by nebulization 2 (two) times daily. (Patient taking differently: Take 0.25 mg by nebulization 2 (two) times daily as needed (for shortness of breath).)     docusate sodium  (COLACE) 100 MG capsule Take 100 mg by mouth daily as needed for mild constipation.     furosemide  (LASIX ) 40 MG tablet Take 1 tablet (40 mg total) by mouth daily.     ipratropium-albuterol  (DUONEB) 0.5-2.5 (3) MG/3ML SOLN Take 3 mLs by nebulization 2 (two) times daily.     metoprolol  succinate (TOPROL -XL) 25 MG 24 hr tablet Take 1 tablet (25 mg total) by mouth daily. (Patient taking differently: Take 25 mg by mouth every evening.)     montelukast  (SINGULAIR ) 10 MG tablet Take 1 tablet (10 mg total) by mouth at bedtime as needed (allergies). 30 tablet  3   Multiple Vitamin (MULTIVITAMIN WITH MINERALS) TABS tablet Take 1 tablet by mouth daily.     pantoprazole  (PROTONIX ) 40 MG tablet Take 1 tablet (40 mg total) by mouth daily.     polyethylene glycol (MIRALAX  / GLYCOLAX ) 17 g packet Take 17 g by mouth daily as needed for mild constipation.     potassium chloride  SA (KLOR-CON  M) 20 MEQ tablet Take 1 tablet (20 mEq total) by mouth daily.     Probiotic Product (PROBIOTIC PO) Take 1 tablet by mouth daily.     ramelteon  (ROZEREM ) 8 MG tablet Take 1 tablet (8 mg total) by mouth at bedtime.     rosuvastatin  (CRESTOR ) 20 MG tablet Take 20 mg by mouth daily.     No current facility-administered medications for this visit.    Allergies[1]  Social History   Socioeconomic History   Marital status:  Married    Spouse name: Not on file   Number of children: 2   Years of education: Not on file   Highest education level: Some college, no degree  Occupational History   Occupation: retired  Tobacco Use   Smoking status: Former    Current packs/day: 0.00    Types: Cigarettes    Quit date: 1979    Years since quitting: 47.0   Smokeless tobacco: Never   Tobacco comments:    Quit in 1979  Vaping Use   Vaping status: Never Used  Substance and Sexual Activity   Alcohol use: Never   Drug use: Never   Sexual activity: Yes    Partners: Female  Other Topics Concern   Not on file  Social History Narrative   Not on file   Social Drivers of Health   Tobacco Use: Medium Risk (02/24/2024)   Patient History    Smoking Tobacco Use: Former    Smokeless Tobacco Use: Never    Passive Exposure: Not on Actuary Strain: Low Risk  (12/02/2023)   Received from [facility]   Overall Financial Resource Strain (CARDIA)    Difficulty of Paying Living Expenses: Not hard at all  Food Insecurity: No Food Insecurity (02/06/2024)   Epic    Worried About Radiation Protection Practitioner of Food in the Last Year: Never true    Ran Out of Food in the  Last Year: Never true  Transportation Needs: No Transportation Needs (02/06/2024)   Epic    Lack of Transportation (Medical): No    Lack of Transportation (Non-Medical): No  Physical Activity: Inactive (12/02/2023)   Received from [facility]   Exercise Vital Sign    On average, how many days per week do you engage in moderate to strenuous exercise (like a brisk walk)?: 0 days    On average, how many minutes do you engage in exercise at this level?: 0 min  Stress: No Stress Concern Present (11/05/2023)   Received from [facility] - Occupational Stress Questionnaire    Feeling of Stress : Not at all  Social Connections: Socially Integrated (02/06/2024)   Social Connection and Isolation Panel    Frequency of Communication with Friends and Family: More than three times a week    Frequency of Social Gatherings with Friends and Family: More than three times a week    Attends Religious Services: More than 4 times per year    Active Member of Clubs or Organizations: Yes    Attends Banker Meetings: More than 4 times per year    Marital Status: Married  Catering Manager Violence: Not At Risk (02/06/2024)   Epic    Fear of Current or Ex-Partner: No    Emotionally Abused: No    Physically Abused: No    Sexually Abused: No  Depression (PHQ2-9): Low Risk (10/23/2021)   Depression (PHQ2-9)    PHQ-2 Score: 0  Alcohol Screen: Low Risk (07/03/2021)   Alcohol Screen    Last Alcohol Screening Score (AUDIT): 0  Housing: Low Risk (02/06/2024)   Epic    Unable to Pay for Housing in the Last Year: No    Number of Times Moved in the Last Year: 0    Homeless in the Last Year: No  Utilities: Not At Risk (02/06/2024)   Epic    Threatened with loss of utilities: No  Health Literacy: Adequate Health Literacy (  11/05/2023)   Received from [facility]   [phone] Health Literacy    How often do you  need to have someone help you when you read instructions, pamphlets, or other written material from your doctor or pharmacy?: Never    Family History  Problem Relation Age of Onset   Hypertension Mother    Stroke Mother    Lung cancer Father    Alcohol abuse Father    Non-Hodgkin's lymphoma Brother    Varicose Veins Brother     Review of Systems:  As stated in the HPI and otherwise negative.   BP 131/65   Pulse 71   Ht 5' 6 (1.676 m)   Wt 210 lb 9.6 oz (95.5 kg)   SpO2 96%   BMI 33.99 kg/m   Physical Examination: General: Well developed, well nourished, NAD  HEENT: OP clear, mucus membranes moist  SKIN: warm, dry. No rashes. Neuro: No focal deficits  Musculoskeletal: Muscle strength 5/5 all ext  Psychiatric: Mood and affect normal  Neck: No JVD, no carotid bruits, no thyromegaly, no lymphadenopathy.  Lungs:Clear bilaterally, no wheezes, rhonci, crackles Cardiovascular: Regular rate and rhythm. No murmurs, gallops or rubs. Abdomen:Soft. Bowel sounds present. Non-tender.  Extremities: No lower extremity edema. Pulses are 2 + in the bilateral DP/PT.  EKG:  EKG is not ordered today. The ekg ordered today demonstrates   Echo 02/02/24: 1. Left ventricular ejection fraction, by estimation, is 50 to 55%. The  left ventricle has low normal function. The left ventricle has no regional  wall motion abnormalities. The left ventricular internal cavity size was  mildly dilated. Left ventricular  diastolic parameters are consistent with Grade II diastolic dysfunction  (pseudonormalization).   2. Right ventricular systolic function was not well visualized. The right  ventricular size is not well visualized.   3. Left atrial size was moderately dilated.   4. Right atrial size was mildly dilated.   5. The mitral valve is degenerative. Mild mitral valve regurgitation.  Mild calcific mitral stenosis. Severe mitral annular calcification.   6. There is a 23 mm Sapien prosthetic (TAVR)  valve present in the aortic  position. Procedure Date: 01/19/2017. Echo findings are consistent with  severe stenosis and moderate perivalvular leak of the aortic prosthesis.  Aortic valve mean gradient measures   54.0 mmHg. Aortic valve Vmax measures 4.52 m/s. DVI 0.13.   7. The inferior vena cava is dilated in size with >50% respiratory  variability, suggesting right atrial pressure of 8 mmHg.   Comparison(s): A prior study was performed on 11/11/2023. TAVR indices now  meet threshold for severe stenosis. Vmax previously 3.64, MG 32 mmHg, DVI  0.26. Recommend valve team consult.   FINDINGS   Left Ventricle: Left ventricular ejection fraction, by estimation, is 50  to 55%. The left ventricle has low normal function. The left ventricle has  no regional wall motion abnormalities. Definity  contrast agent was given  IV to delineate the left  ventricular endocardial borders. The left ventricular internal cavity size  was mildly dilated. There is no left ventricular hypertrophy. Left  ventricular diastolic parameters are consistent with Grade II diastolic  dysfunction (pseudonormalization).   Right Ventricle: The right ventricular size is not well visualized. Right  vetricular wall thickness was not well visualized. Right ventricular  systolic function was not well visualized.   Left Atrium: Left atrial size was moderately dilated.   Right Atrium: Right atrial size was mildly dilated.   Pericardium: There is no evidence  of pericardial effusion.   Mitral Valve: The mitral valve is degenerative in appearance. There is  severe calcification of the mitral valve leaflet(s). Moderate mitral  annular calcification. Mild mitral valve regurgitation. Mild mitral valve  stenosis.   Tricuspid Valve: The tricuspid valve is not well visualized. Tricuspid  valve regurgitation is not demonstrated. No evidence of tricuspid  stenosis.   There is a 23 mm Sapien prosthetic, stented (TAVR) valve  present in the  aortic position. Procedure Date: 01/19/2017.  Pulmonic Valve: The pulmonic valve was not well visualized. Pulmonic valve  regurgitation is not visualized. No evidence of pulmonic stenosis.   Aorta: The aortic root is normal in size and structure.   Venous: The inferior vena cava is dilated in size with greater than 50%  respiratory variability, suggesting right atrial pressure of 8 mmHg.   IAS/Shunts: The interatrial septum was not well visualized.     LEFT VENTRICLE  PLAX 2D  LVIDd:         6.30 cm Diastology  LVIDs:         5.10 cm LV e' medial:    4.57 cm/s  LV PW:         1.20 cm LV E/e' medial:  30.4  LV IVS:        0.70 cm LV e' lateral:   4.86 cm/s                         LV E/e' lateral: 28.6     RIGHT VENTRICLE             IVC  RV Basal diam:  5.20 cm     IVC diam: 2.10 cm  RV S prime:     10.09 cm/s  TAPSE (M-mode): 2.2 cm   LEFT ATRIUM             Index        RIGHT ATRIUM           Index  LA diam:        5.80 cm 2.85 cm/m   RA Area:     18.30 cm  LA Vol (A2C):   99.2 ml 48.69 ml/m  RA Volume:   53.50 ml  26.26 ml/m  LA Vol (A4C):   79.4 ml 38.97 ml/m  LA Biplane Vol: 90.8 ml 44.56 ml/m   AORTIC VALVE  AV Vmax:           452.00 cm/s  AV Vmean:          331.600 cm/s  AV VTI:            0.942 m  AV Peak Grad:      81.7 mmHg  AV Mean Grad:      54.0 mmHg  LVOT Vmax:         56.93 cm/s  LVOT Vmean:        38.600 cm/s  LVOT VTI:          0.108 m  LVOT/AV VTI ratio: 0.12  AI PHT:            199 msec    AORTA  Ao Root diam: 2.90 cm   MITRAL VALVE  MV Area (PHT): 4.26 cm     SHUNTS  MV Decel Time: 178 msec     Systemic VTI: 0.11 m  MR Peak grad: 145.7 mmHg  MR Mean grad: 85.5 mmHg  MR Vmax:      603.50 cm/s  MR Vmean:     430.0 cm/s  MV E velocity: 139.00 cm/s  MV A velocity: 73.85 cm/s  MV E/A ratio:  1.88   Cardiac cath 02/04/24: 1.  Significant underlying three-vessel coronary artery disease with patent grafts including LIMA to  LAD, SVG to OM1 and SVG to OM 3.  There is significant disease in the right coronary artery but the vessel is small.  Significant ostial left main stenosis supplying only a first diagonal branch. 2.  Left ventricular angiography was not performed.  EF was normal by echo.  I did not attempt to cross the TAVR prosthesis given concerns about leaflet thrombosis. 3.  Right heart catheterization showed normal right atrial pressure, moderate pulmonary hypertension, moderately elevated wedge pressure and normal cardiac output. RA: 3 mmHg, RV is 66/2 mmHg, PW is 22 mmHg with prominent V wave, PA: 55/17 with a mean of 30 mmHg.  Cardiac output/cardiac index: 6.31/3.1.  Cardiac CT 02/06/22: Prosthetic Valve: 23 mm Edward Sapien valve   Hypoattenuation and degree: Calcification of the noncoronary cusp prosthetic leaflet and base of the left cusp prosthetic leaflet.   Valve to coronary distance (left main) - 3 mm.   Valve to coronary distance (RCA) - 4 mm.   Severe Annular calcification (Calcification into the LVOT).   No paravalvular leak.   Mitral Valve: Anterior valve leaflet calcification into the intervalvular fibrosa   Internal diameter   Major annulus diameter: 21 mm   Minor annulus diameter: 19 mm   Aortic Measurements- 95% phase   Sinotubular Junction: 29 mm   Ascending Thoracic Aorta: 29 mm   Descending Thoracic Aorta: 26 mm   Sinus of Valsalva Measurements:   Right coronary cusp width: 31 mm   Left coronary cusp width: 29 mm   Non coronary cusp width: 30 mm   Optimum Fluoroscopic Angle for Delivery: LAO 4, CAU 6   Prosthetic Valve- Left Main Coronary angle: RAO 0, CAU 11   Scenes are saved for both a 23 mm Sapien Valve and a 26 mm Sapien Valve (RCA VTC 5 mm, LVOT area 5.44 cm2)   Non TAVR Valve Findings:   Coronary Arteries: Normal coronary origin. Study not completed with nitroglycerin . Incomplete evaluation-LIMA to LAD is distally patent.   Systemic veins:  Evidence of a left sided SVC. No evidence of interatrial communication. Consider left arm Bubble study if clinically indicated.   Main Pulmonary artery: Moderate dilation 33 mm.   Pulmonary veins: Normal variant right middle vein.   Left atrial appendage: Patent.   Interatrial septum: No clear communication.   Chamber dimensions: Biventricular dilation.   Pericardium: No calcification.   Extra Cardiac Findings as per separate reporting.   Notable artifacts: Incomplete opacification   Image quality: Fair   IMPRESSION: 1. Bioprosthetic Aortic stenosis. Findings pertinent to TAVR procedure are detailed above.  CT chest/abd/pelvis 02/07/24: VASCULATURE: AORTA: Aortic atherosclerosis. No acute finding. No abdominal aortic aneurysm. No dissection.   PULMONARY ARTERIES: While the study was not performed adequately opacify and evaluate the pulmonary arteries, there is the suggestion of a filling defect within a peripheral left lower lobe pulmonary artery, image 206 of series 3. Consider further evaluation with repeat CTA chest performed to adequately evaluate the pulmonary arteries and exclude pulmonary embolus.   GREAT VESSELS OF AORTIC ARCH: No acute finding. No dissection. No arterial occlusion or significant stenosis.   CELIAC TRUNK: Calcified plaque at the origin of the celiac artery without significant stenosis.   SUPERIOR MESENTERIC ARTERY: Calcified  plaque at the origin of the superior mesenteric artery without significant stenosis.   INFERIOR MESENTERIC ARTERY: No acute finding. No occlusion or significant stenosis.   RENAL ARTERIES: Calcified plaque at the origin of the renal arteries without significant stenosis.   ILIAC ARTERIES: No acute finding. No occlusion or significant stenosis.   CHEST:   MEDIASTINUM: Cardiomegaly. Prior aortic valve repair and CABG. Mildly prominent mediastinal lymph nodes without pathologically enlarged adenopathy. These are  likely reactive. The pericardium demonstrates no acute abnormality.   LUNGS AND PLEURA: Small bilateral pleural effusions. Bibasilar opacities favored to reflect atelectasis. No pneumothorax.   THORACIC BONES AND SOFT TISSUES: No acute bone or soft tissue abnormality.   ABDOMEN AND PELVIS:   LIVER: The liver is unremarkable.   GALLBLADDER AND BILE DUCTS: Prior cholecystectomy. Pneumobilia noted. No biliary ductal dilatation.   SPLEEN: The spleen is unremarkable.   PANCREAS: The pancreas is unremarkable.   ADRENAL GLANDS: Bilateral adrenal glands demonstrate no acute abnormality.   KIDNEYS, URETERS AND BLADDER: No stones in the kidneys or ureters. No hydronephrosis. No perinephric or periureteral stranding. Urinary bladder is unremarkable.   GI AND BOWEL: Colonic diverticulosis, most pronounced in the sigmoid colon. No active diverticulitis. Normal appendix. Stomach and duodenal sweep demonstrate no acute abnormality. There is no bowel obstruction. No abnormal bowel wall thickening or distension.   REPRODUCTIVE: Reproductive organs are unremarkable.   PERITONEUM AND RETROPERITONEUM: Prominent bilateral inguinal hernias containing fat. No ascites or free air.   LYMPH NODES: No lymphadenopathy.   ABDOMINAL BONES AND SOFT TISSUES: No acute abnormality of the bones. No acute soft tissue abnormality.   IMPRESSION: 1. Suggestion of a filling defect within a peripheral left lower lobe pulmonary artery; the study was not performed adequately to opacify and evaluate the pulmonary arteries, and repeat CTA chest is recommended for further evaluation to exclude pulmonary embolus . 2. Cardiomegaly with prior aortic valve repair and CABG. 3. Aortic atherosclerosis with calcified plaque at the origin of the celiac artery, SMA, and renal arteries without significant stenosis. 4. Small bilateral pleural effusions with bibasilar opacities favored to reflect atelectasis. 5.  Colonic diverticulosis . No active diverticulitis.  Recent Labs: 02/02/2024: ALT 17 02/11/2024: Pro Brain Natriuretic Peptide 4,279.0 02/15/2024: Hemoglobin 12.9; Platelets 170 02/16/2024: Magnesium  2.0 02/17/2024: BUN 26; Creatinine, Ser 1.43; Potassium 3.2; Sodium 144    Wt Readings from Last 3 Encounters:  02/24/24 210 lb 9.6 oz (95.5 kg)  02/17/24 191 lb (86.6 kg)  02/06/24 206 lb 12.7 oz (93.8 kg)    Assessment and Plan:   1. Severe aortic stenosis secondary to failure of the bioprosthetic AVR: He has severe AS/moderate AI secondary to failure of his bioprosthetic AVR (TAVR procedure in 2018 in [facility]). He has been admitted several times with CHF. Valve in valve TAVR is indicated. He is doing much better. Mental status has recovered.  Will plan valve in valve TAVR on 02/29/24 at [facility] 02/29/24 with placement of a 26 mm Medtronic Evolut valve inside the old Sapien valve.   He has transfemoral access.  Pre-admission testing tomorrow  2. Acute on Chronic diastolic CHF: He has gained several pounds over the past few days.  -Will increase Lasix  to 80 mg daily for three days then back to 40 mg daily  Labs/ tests ordered today include:  No orders of the defined types were placed in this encounter.  Disposition:   F/U with the structural heart team will be arranged.   Signed, Lonni Cash, MD, [facility] 02/24/2024  4:20 PM    [facility] [address] [place], [place], [place]  [address] Phone: [phone]; Fax: [phone]       [1]  Allergies Allergen Reactions   Atorvastatin Other (See Comments)    aches  myalgia  myalgia    aches  myalgia   Ramipril Other (See Comments) and Cough    cough   "

## 2024-02-24 NOTE — Patient Instructions (Signed)
 Medication Instructions:  Increase furosemide  to 80 mg daily for the next 3 days and then go back to 40 mg daily  *If you need a refill on your cardiac medications before your next appointment, please call your pharmacy*  Lab Work: none If you have labs (blood work) drawn today and your tests are completely normal, you will receive your results only by: MyChart Message (if you have MyChart) OR A paper copy in the mail If you have any lab test that is abnormal or we need to change your treatment, we will call you to review the results.  Testing/Procedures: none  Follow-Up: At [facility], you and your health needs are our priority.  As part of our continuing mission to provide you with exceptional heart care, our providers are all part of one team.  This team includes your primary Cardiologist (physician) and Advanced Practice Providers or APPs (Physician Assistants and Nurse Practitioners) who all work together to provide you with the care you need, when you need it.  Your next appointment:   To be determined after procedure  Provider:   Dr Verlin   We recommend signing up for the patient portal called MyChart.  Sign up information is provided on this After Visit Summary.  MyChart is used to connect with patients for Virtual Visits (Telemedicine).  Patients are able to view lab/test results, encounter notes, upcoming appointments, etc.  Non-urgent messages can be sent to your provider as well.   To learn more about what you can do with MyChart, go to [email or web address].   Other Instructions

## 2024-02-24 NOTE — Progress Notes (Signed)
 Pre Surgical Assessment: 5 M Walk Test  67M=16.31ft  5 Meter Walk Test- trial 1: 5.28 seconds 5 Meter Walk Test- trial 2: 5.53 seconds 5 Meter Walk Test- trial 3: 5.49 seconds 5 Meter Walk Test Average: 5.43 seconds

## 2024-02-25 ENCOUNTER — Encounter (HOSPITAL_COMMUNITY)
Admission: RE | Admit: 2024-02-25 | Discharge: 2024-02-25 | Disposition: A | Discharge status: Home / Self Care | Source: Ambulatory Visit | Attending: Cardiovascular Disease | Admitting: Cardiovascular Disease

## 2024-02-25 ENCOUNTER — Other Ambulatory Visit: Payer: Self-pay

## 2024-02-25 ENCOUNTER — Ambulatory Visit (HOSPITAL_COMMUNITY)
Admission: RE | Admit: 2024-02-25 | Discharge: 2024-02-25 | Disposition: A | Discharge status: Home / Self Care | Source: Ambulatory Visit | Attending: Cardiovascular Disease | Admitting: Cardiovascular Disease

## 2024-02-25 DIAGNOSIS — Y712 Prosthetic and other implants, materials and accessory cardiovascular devices associated with adverse incidents: Secondary | ICD-10-CM | POA: Insufficient documentation

## 2024-02-25 DIAGNOSIS — Z01818 Encounter for other preprocedural examination: Secondary | ICD-10-CM | POA: Insufficient documentation

## 2024-02-25 DIAGNOSIS — T82857D Stenosis of cardiac prosthetic devices, implants and grafts, subsequent encounter: Secondary | ICD-10-CM | POA: Insufficient documentation

## 2024-02-25 DIAGNOSIS — I350 Nonrheumatic aortic (valve) stenosis: Secondary | ICD-10-CM | POA: Insufficient documentation

## 2024-02-25 LAB — CBC
HCT: 40.1 % (ref 39.0–52.0)
Hemoglobin: 13.5 g/dL (ref 13.0–17.0)
MCH: 32.4 pg (ref 26.0–34.0)
MCHC: 33.7 g/dL (ref 30.0–36.0)
MCV: 96.2 fL (ref 80.0–100.0)
Platelets: 158 K/uL (ref 150–400)
RBC: 4.17 MIL/uL — ABNORMAL LOW (ref 4.22–5.81)
RDW: 15.1 % (ref 11.5–15.5)
WBC: 8 K/uL (ref 4.0–10.5)
nRBC: 0 % (ref 0.0–0.2)

## 2024-02-25 LAB — COMPREHENSIVE METABOLIC PANEL WITH GFR
ALT: 40 U/L (ref 0–44)
AST: 44 U/L — ABNORMAL HIGH (ref 15–41)
Albumin: 3.5 g/dL (ref 3.5–5.0)
Alkaline Phosphatase: 77 U/L (ref 38–126)
Anion gap: 7 (ref 5–15)
BUN: 18 mg/dL (ref 8–23)
CO2: 33 mmol/L — ABNORMAL HIGH (ref 22–32)
Calcium: 9.8 mg/dL (ref 8.9–10.3)
Chloride: 103 mmol/L (ref 98–111)
Creatinine, Ser: 1.18 mg/dL (ref 0.61–1.24)
GFR, Estimated: >60 mL/min
Glucose, Bld: 91 mg/dL (ref 70–99)
Potassium: 4 mmol/L (ref 3.5–5.1)
Sodium: 144 mmol/L (ref 135–145)
Total Bilirubin: 1.3 mg/dL — ABNORMAL HIGH (ref 0.0–1.2)
Total Protein: 6.8 g/dL (ref 6.5–8.1)

## 2024-02-25 LAB — PROTIME-INR
INR: 1.2 (ref 0.8–1.2)
Prothrombin Time: 15.5 s — ABNORMAL HIGH (ref 11.4–15.2)

## 2024-02-25 LAB — URINALYSIS, ROUTINE W REFLEX MICROSCOPIC
Bilirubin Urine: NEGATIVE
Glucose, UA: NEGATIVE mg/dL
Hgb urine dipstick: NEGATIVE
Ketones, ur: NEGATIVE mg/dL
Leukocytes,Ua: NEGATIVE
Nitrite: NEGATIVE
Protein, ur: NEGATIVE mg/dL
Specific Gravity, Urine: 1.013 (ref 1.005–1.030)
pH: 5 (ref 5.0–8.0)

## 2024-02-25 LAB — SURGICAL PCR SCREEN
MRSA, PCR: NEGATIVE
Staphylococcus aureus: NEGATIVE

## 2024-02-25 LAB — TYPE AND SCREEN
ABO/RH(D): A POS
Antibody Screen: NEGATIVE

## 2024-02-25 NOTE — Progress Notes (Signed)
 All consents signed by patient at PAT lab appointment. Pt was sent home with printed copy of surgical instructions and CHG soap/CHG soap instructions. All instructions reviewed with patient and questions answered.  Patients chart send to anesthesia for review. Pt admitted to [facility] 02/06/24 to 02/17/2024 for PNA. TAVR team aware.

## 2024-02-25 NOTE — Progress Notes (Signed)
 Per Dr Verlin, NYHA class III.

## 2024-02-25 NOTE — Progress Notes (Signed)
 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 Discussed with Lynwood Hope, PA-C who said pt can have ice chips, in small amounts until 11am morning of surgery. Lamarr Hummer, PA with TAVR team also made aware of information and okay with ice chips as long as anesthesia team was okay with it.

## 2024-02-28 MED ORDER — HEPARIN 30,000 UNITS/1000 ML (OHS) CELLSAVER SOLUTION
Status: DC
  Filled 2024-02-28: qty 1000

## 2024-02-28 MED ORDER — CEFAZOLIN SODIUM-DEXTROSE 2-4 GM/100ML-% IV SOLN
2.0000 g | INTRAVENOUS | Status: AC
  Administered 2024-02-29: 2 g via INTRAVENOUS
  Filled 2024-02-28: qty 100

## 2024-02-28 MED ORDER — POTASSIUM CHLORIDE 2 MEQ/ML IV SOLN
80.0000 meq | INTRAVENOUS | Status: DC
  Filled 2024-02-28: qty 40

## 2024-02-28 MED ORDER — NOREPINEPHRINE 4 MG/250ML-% IV SOLN
0.0000 ug/min | INTRAVENOUS | Status: AC
  Administered 2024-02-29: 2 ug/min via INTRAVENOUS
  Filled 2024-02-28: qty 250

## 2024-02-28 MED ORDER — MAGNESIUM SULFATE 50 % IJ SOLN
40.0000 meq | INTRAMUSCULAR | Status: DC
  Filled 2024-02-28: qty 9.85

## 2024-02-28 MED ORDER — DEXMEDETOMIDINE HCL IN NACL 400 MCG/100ML IV SOLN
0.1000 ug/kg/h | INTRAVENOUS | Status: AC
  Administered 2024-02-29: 1 ug/kg/h via INTRAVENOUS
  Filled 2024-02-28: qty 100

## 2024-02-28 NOTE — H&P (Signed)
 "  [address] [place] [address]             [phone]     [facility] Medical Record #[id number] Date of Birth: 1940-11-21   Referring: Darron Deatrice LABOR, MD Primary Care: [name], MD Primary Cardiologist:Timothy Perla, MD   Chief Complaint:   No chief complaint on file.     History of Present Illness:     Jacob Montes is a 84 y.o. male presents for surgical evaluation of  acute decompensated heart failure likely secondary to rapidly progressive prosthetic aortic valve stenosis.  He had a 23mm Edwards Sapien TAVR valve placed 01/2017 in [place]  (see media tab for details).  This is his second heart failure hospitalization this month.  He failed outpatient diuretic therapy, including outpatient IV diuresis.                Past Medical History:  Diagnosis Date   Actinic keratosis     Acute exacerbation of CHF (congestive heart failure) (HCC) 01/19/2024   Aortic stenosis s/p TAVR      a. 01/2017 s/p TAVR - 23mm Sapien; b. 11/2023 Echo: EF 50-55%, GrII DD, nl RV fxn, mild-mod MR, mild Ca2+ MS, Mod AS, AVA 0.84, mean gradient 32 mmHg, mild perivalvular leak; c. 01/2024 Echo: EF 50-55%, no rwma, GrII DD, nl RV fxn, mod dil LA, mildly dil RA, mild MR/MS, sev AS w/ mod perivalvular leak - mean grad , Vmax 4.52 m/s, DVI 0.13.   CAD (coronary artery disease)      a. 07/2007 s/p CABG x 3 [facility] [place]); b. 12/2023 MV: No isch/infarct; c. 01/2024 Cath: LM 95ost/m, LAD 164m, LCX 100ost/p/m, OM3 40, RCA small, 95p, 31m, RPDA fills via collats from LAD. VG->OM1 nl, VG->OM3 nl, LIMA->LAD nl.   Cancer (HCC)      Skin   Carotid artery disease      a. 2009 s/p left sided CEA complicated by RLN parlaysis; b. 02/2023 U/S: no significant dzs.   Cataract     Chronic heart failure with preserved ejection fraction (HFpEF) (HCC)      a. 01/2024 Echo: EF 50-55%, GrII DD, nl RV fxn, mod dil LA, mild MR/MS, severe AS, mod perivalvular leak;  b. 01/2024 RHC: RA 3, RV 66/2, PA 55/17 (30), PCWP 20.   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   GERD (gastroesophageal reflux disease)     HLD (hyperlipidemia)     Hyperlipidemia 10/15/2015   Hypertension     Nephrolithiasis 11/16/2018   Pulmonary embolism (HCC) 08/2010   Skin cancer     Sleep apnea     Stroke [facility])                 Past Surgical History:  Procedure Laterality Date   CARDIAC CATHETERIZATION       CARDIAC VALVE REPLACEMENT   01-19-17   Carotid surgery Left 03/08/2007   CHOLECYSTECTOMY   02/16/2018   COLONOSCOPY WITH PROPOFOL  N/A 09/01/2022    Procedure: COLONOSCOPY WITH PROPOFOL ;  Surgeon: Maryruth Ole DASEN, MD;  Location: [facility] ENDOSCOPY;  Service: Endoscopy;  Laterality: N/A;   CORONARY ARTERY BYPASS GRAFT       ENDOSCOPIC RETROGRADE CHOLANGIOPANCREATOGRAPHY (ERCP) WITH PROPOFOL  N/A 07/10/2021    Procedure: ENDOSCOPIC RETROGRADE CHOLANGIOPANCREATOGRAPHY (ERCP) WITH PROPOFOL ;  Surgeon: Jinny Carmine, MD;  Location: [facility] ENDOSCOPY;  Service: Endoscopy;  Laterality: N/A;   ERCP N/A  11/18/2018    Procedure: ENDOSCOPIC RETROGRADE CHOLANGIOPANCREATOGRAPHY (ERCP);  Surgeon: Jinny Carmine, MD;  Location: [facility] ENDOSCOPY;  Service: Endoscopy;  Laterality: N/A;   EUS N/A 08/21/2021    Procedure: UPPER ENDOSCOPIC ULTRASOUND (EUS) LINEAR;  Surgeon: Queenie Asberry LABOR, MD;  Location: [facility] ENDOSCOPY;  Service: Gastroenterology;  Laterality: N/A;  [facility]   EYE SURGERY   04-15-99    09-14-99   Heart Bypass N/A 07/2007   HERNIA REPAIR   03-12-88    Not sure of date   left shoulder surgery Left 06/03/2009   POLYPECTOMY   09/01/2022    Procedure: POLYPECTOMY;  Surgeon: Maryruth Ole DASEN, MD;  Location: [facility] ENDOSCOPY;  Service: Endoscopy;;   RIGHT AND LEFT HEART CATH   07/18/2007   right shoulder surgery   2003   RIGHT/LEFT HEART CATH AND CORONARY/GRAFT ANGIOGRAPHY N/A 02/04/2024    Procedure: RIGHT/LEFT HEART CATH AND  CORONARY/GRAFT ANGIOGRAPHY;  Surgeon: Darron Deatrice LABOR, MD;  Location: [facility] INVASIVE CV LAB;  Service: Cardiovascular;  Laterality: N/A;   TEE WITHOUT CARDIOVERSION N/A 11/21/2018    Procedure: TRANSESOPHAGEAL ECHOCARDIOGRAM (TEE);  Surgeon: Darron Deatrice LABOR, MD;  Location: [facility] ORS;  Service: Cardiovascular;  Laterality: N/A;   TVAR              Social History:   [Tobacco Use History]   [Tobacco Use History]     Tobacco Use  Smoking Status Former   Current packs/day: 0.00   Types: Cigarettes   Quit date: 1979   Years since quitting: 47.0  Smokeless Tobacco Never  Tobacco Comments    Quit in 1979   Social History       Substance and Sexual Activity  Alcohol Use Never        [Allergies]  [Allergies]      Allergen Reactions   Atorvastatin Other (See Comments)      aches   myalgia   myalgia    aches  myalgia   Ramipril Other (See Comments) and Cough      cough                  Current Facility-Administered Medications  Medication Dose Route Frequency Provider Last Rate Last Admin   acetaminophen  (TYLENOL ) tablet 650 mg  650 mg Oral Q4H PRN Haley, Sheng L, PA-C       aspirin  EC tablet 81 mg  81 mg Oral Daily Haley, Sheng L, PA-C   81 mg at 02/08/24 2054   budesonide  (PULMICORT ) nebulizer solution 0.25 mg  0.25 mg Nebulization BID Haley, Sheng L, PA-C   0.25 mg at 02/09/24 0850   [START ON 02/11/2024] ceFAZolin  (ANCEF ) IVPB 2g/100 mL premix  2 g Intravenous To OR Thukkani, Arun K, MD       [START ON 02/11/2024] dexmedetomidine  (PRECEDEX ) 400 MCG/100ML (4 mcg/mL) infusion  0.1-0.7 mcg/kg/hr Intravenous To OR Thukkani, Arun K, MD       enoxaparin  (LOVENOX ) injection 40 mg  40 mg Subcutaneous Daily Henry Shaver B, NP   40 mg at 02/09/24 1006   [START ON 02/11/2024] heparin  30,000 units/NS 1000 mL solution for CELLSAVER   Other To OR Thukkani, Arun K, MD       ipratropium-albuterol  (DUONEB) 0.5-2.5 (3) MG/3ML nebulizer solution 3 mL  3 mL Nebulization Q6H PRN  Thukkani, Arun K, MD       [START ON 02/11/2024] magnesium  sulfate (IV Push/IM) injection 40 mEq  40 mEq Other To OR Thukkani, Arun K, MD  metoprolol  succinate (TOPROL -XL) 24 hr tablet 25 mg  25 mg Oral Daily Haley, Sheng L, PA-C   25 mg at 02/09/24 1006   montelukast  (SINGULAIR ) tablet 10 mg  10 mg Oral QHS PRN Haley, Sheng L, PA-C       multivitamin with minerals tablet 1 tablet  1 tablet Oral Daily Darryle Thom CROME, PA-C   1 tablet at 02/09/24 1006   nitroGLYCERIN  (NITROSTAT ) SL tablet 0.4 mg  0.4 mg Sublingual Q5 Min x 3 PRN Haley, Sheng L, PA-C       [START ON 02/11/2024] norepinephrine  (LEVOPHED ) 4mg  in (0.016 mg/mL) premix infusion  0-10 mcg/min Intravenous To OR Thukkani, Arun K, MD       ondansetron  (ZOFRAN ) injection 4 mg  4 mg Intravenous Q6H PRN Haley, Sheng L, PA-C   4 mg at 02/07/24 9271   pantoprazole  (PROTONIX ) EC tablet 40 mg  40 mg Oral Q0600 Haley, Sheng L, PA-C   40 mg at 02/09/24 0535   polyethylene glycol (MIRALAX  / GLYCOLAX ) packet 17 g  17 g Oral Daily PRN Haley, Sheng L, PA-C       [START ON 02/11/2024] potassium chloride  injection 80 mEq  80 mEq Other To OR Thukkani, Arun K, MD       potassium chloride  SA (KLOR-CON  M) CR tablet 40 mEq  40 mEq Oral Daily Vivienne Lonni Ingle, NP   40 mEq at 02/09/24 1005   rosuvastatin  (CRESTOR ) tablet 40 mg  40 mg Oral Daily Haley, Sheng L, PA-C   40 mg at 02/08/24 2054                 Medications Prior to Admission  Medication Sig Dispense Refill Last Dose/Taking   acetaminophen  (TYLENOL ) 325 MG tablet Take 2 tablets (650 mg total) by mouth every 6 (six) hours as needed for mild pain (pain score 1-3) or fever (or Fever >/= 101).     Past Week   amLODipine  (NORVASC ) 10 MG tablet Take 10 mg by mouth daily.     Past Week   aspirin  81 MG chewable tablet Chew 81 mg by mouth daily.     Past Week   budesonide  (PULMICORT ) 0.25 MG/2ML nebulizer solution Take 2 mLs (0.25 mg total) by nebulization 2 (two) times daily. (Patient taking  differently: Take 0.25 mg by nebulization 2 (two) times daily as needed (for shortness of breath).)     02/06/2024 Morning   docusate sodium  (COLACE) 100 MG capsule Take 100 mg by mouth daily as needed for mild constipation.     Past Week   furosemide  (LASIX ) 10 MG/ML injection Inject 4 mLs (40 mg total) into the vein 2 (two) times daily.     Unknown   furosemide  (LASIX ) 40 MG tablet Take 60 mg by mouth daily.     Past Week   metoprolol  succinate (TOPROL -XL) 25 MG 24 hr tablet Take 1 tablet (25 mg total) by mouth daily. (Patient taking differently: Take 25 mg by mouth every evening.)     Past Week   montelukast  (SINGULAIR ) 10 MG tablet Take 1 tablet (10 mg total) by mouth at bedtime as needed (allergies). 30 tablet 3 Past Week   Multiple Vitamin (MULTIVITAMIN WITH MINERALS) TABS tablet Take 1 tablet by mouth daily.     Past Week   omeprazole  (PRILOSEC ) 20 MG capsule Take 20 mg by mouth daily.     Past Week   potassium chloride  SA (KLOR-CON  M) 20 MEQ tablet Take 2 tablets (40  mEq total) by mouth 2 (two) times daily. (Patient taking differently: Take 20 mEq by mouth daily.)     Past Week   Probiotic Product (PROBIOTIC PO) Take 1 tablet by mouth daily.     Past Week   rosuvastatin  (CRESTOR ) 20 MG tablet Take 20 mg by mouth daily.     Past Week   heparin  25000 UT/250ML infusion Inject 1,100 Units/hr into the vein continuous. (Patient not taking: Reported on 02/06/2024)     Not Taking   ipratropium-albuterol  (DUONEB) 0.5-2.5 (3) MG/3ML SOLN Take 3 mLs by nebulization 2 (two) times daily. (Patient not taking: Reported on 02/06/2024)     Not Taking   Morphine  Sulfate (MORPHINE , PF,) 2 MG/ML injection Inject 1 mL (2 mg total) into the vein every 4 (four) hours as needed. (Patient not taking: Reported on 02/06/2024)     Not Taking   ondansetron  (ZOFRAN ) 4 MG tablet Take 1 tablet (4 mg total) by mouth every 6 (six) hours as needed for nausea. (Patient not taking: Reported on 02/06/2024)     Not Taking    ondansetron  (ZOFRAN ) 4 MG/2ML SOLN injection Inject 2 mLs (4 mg total) into the vein every 6 (six) hours as needed for nausea. (Patient not taking: Reported on 02/06/2024)     Not Taking   oxyCODONE  (OXY IR/ROXICODONE ) 5 MG immediate release tablet Take 1 tablet (5 mg total) by mouth every 4 (four) hours as needed for moderate pain (pain score 4-6). (Patient not taking: Reported on 02/06/2024)     Not Taking   pantoprazole  (PROTONIX ) 40 MG tablet Take 1 tablet (40 mg total) by mouth 2 (two) times daily before a meal. (Patient not taking: Reported on 02/06/2024)     Not Taking   polyethylene glycol (MIRALAX  / GLYCOLAX ) 17 g packet Take 17 g by mouth daily as needed for mild constipation. (Patient not taking: Reported on 02/06/2024)     Not Taking   rosuvastatin  (CRESTOR ) 40 MG tablet Take 1 tablet (40 mg total) by mouth daily. (Patient not taking: Reported on 02/06/2024)     Not Taking   sodium chloride  flush (NS) 0.9 % SOLN Inject 3 mLs into the vein every 12 (twelve) hours.         sodium chloride  flush (NS) 0.9 % SOLN Inject 3 mLs into the vein as needed.                     Family History  Problem Relation Age of Onset   Hypertension Mother     Stroke Mother     Lung cancer Father     Alcohol abuse Father     Non-Hodgkin's lymphoma Brother     Varicose Veins Brother              Review of Systems:    Review of Systems  Constitutional:  Positive for malaise/fatigue.  Cardiovascular:  Positive for chest pain and leg swelling.                  Physical Exam: BP (!) 118/41 (BP Location: Right Arm)   Pulse 66   Temp 98 F (36.7 C) (Oral)   Resp 18   Ht 5' 6 (1.676 m)   Wt 92.4 kg   SpO2 95%   BMI 32.89 kg/m  Physical Exam Constitutional:      General: He is not in acute distress.    Appearance: He is not ill-appearing.  HENT:     Head:  Normocephalic and atraumatic.  Cardiovascular:     Rate and Rhythm: Normal rate.  Pulmonary:     Effort: Pulmonary effort is  normal. No respiratory distress.  Abdominal:     General: There is no distension.  Neurological:     General: No focal deficit present.     Mental Status: He is alert and oriented to person, place, and time.         Cardiac Studies & Procedures Objective  ______________________________________________________________________________________________ CARDIAC CATHETERIZATION   CARDIAC CATHETERIZATION 02/04/2024   Conclusion   Ost LM to Mid LM lesion is 95% stenosed.   Ost Cx to Prox Cx lesion is 100% stenosed.   Mid LAD lesion is 100% stenosed.   Mid Cx lesion is 100% stenosed.   3rd Mrg lesion is 40% stenosed.   Prox RCA lesion is 95% stenosed.   Mid RCA lesion is 80% stenosed.   SVG graft was visualized by angiography and is normal in caliber.   SVG graft was visualized by angiography.   LIMA graft was visualized by angiography and is normal in caliber.   The graft exhibits no disease.   The graft exhibits mild focal disease.   The graft exhibits no disease.   1.  Significant underlying three-vessel coronary artery disease with patent grafts including LIMA to LAD, SVG to OM1 and SVG to OM 3.  There is significant disease in the right coronary artery but the vessel is small.  Significant ostial left main stenosis supplying only a first diagonal branch. 2.  Left ventricular angiography was not performed.  EF was normal by echo.  I did not attempt to cross the TAVR prosthesis given concerns about leaflet thrombosis. 3.  Right heart catheterization showed normal right atrial pressure, moderate pulmonary hypertension, moderately elevated wedge pressure and normal cardiac output. RA: 3 mmHg, RV is 66/2 mmHg, PW is 22 mmHg with prominent V wave, PA: 55/17 with a mean of 30 mmHg.  Cardiac output/cardiac index: 6.31/3.1.   Recommendations: start heparin  drip 2 hours after TR band removal due to concerns about TAVR leaflet thrombosis. Continue IV diuresis. Recommend medical therapy for  coronary artery disease.  No revascularization is needed. Continue evaluation for aortic valve valve in valve procedure.   Findings Coronary Findings Diagnostic  Dominance: Co-dominant   Left Main Ost LM to Mid LM lesion is 95% stenosed.   Left Anterior Descending Mid LAD lesion is 100% stenosed.   First Diagonal Branch Vessel is large in size.   Left Circumflex Ost Cx to Prox Cx lesion is 100% stenosed. Mid Cx lesion is 100% stenosed.   Second Obtuse Marginal Branch Collaterals 2nd Mrg filled by collaterals from 1st Mrg.   Third Obtuse Marginal Branch 3rd Mrg lesion is 40% stenosed.   Right Coronary Artery Vessel is small. Prox RCA lesion is 95% stenosed. Mid RCA lesion is 80% stenosed.   Right Posterior Descending Artery Collaterals RPDA filled by collaterals from Dist LAD.   Saphenous Graft To 1st Mrg SVG graft was visualized by angiography and is normal in caliber.  The graft exhibits no disease.   Saphenous Graft To 3rd Mrg SVG graft was visualized by angiography.  The graft exhibits mild focal disease.   LIMA LIMA Graft To Dist LAD LIMA graft was visualized by angiography and is normal in caliber.  The graft exhibits no disease.   Intervention   No interventions have been documented.     STRESS TESTS   NM MYOCAR MULTI W/SPECT W 01/05/2024  Narrative   No ST deviation was noted.   Pharmacological myocardial perfusion imaging study with no significant  ischemia Small region fixed defect distal inferolateral wall, unable to exclude prior MI No focal wall motion abnormality, EF estimated at 30% (poor estimation in the setting of GI and attenuation artifact) No EKG changes concerning for ischemia at peak stress or in recovery. Low risk scan Recent echo with low normal ejection fraction 50 to 55%     Signed, Velinda Lunger, MD, Ph.D [facility]   ECHOCARDIOGRAM   ECHOCARDIOGRAM COMPLETE 02/01/2024   Narrative ECHOCARDIOGRAM REPORT        Patient Name:   Jacob Montes Date of Exam: 02/01/2024 Medical Rec #:  [id number]     Height:       66.0 in Accession #:    [id number]    Weight:       209.0 lb Date of Birth:  02-20-1940     BSA:          2.038 m Patient Age:    83 years      BP:           112/42 mmHg Patient Gender: M             HR:           77 bpm. Exam Location:  [facility]   Procedure: 2D Echo, Cardiac Doppler, Color Doppler and Intracardiac Opacification Agent (Both Spectral and Color Flow Doppler were utilized during procedure).   Indications:     I50.31 Acute Diastolic Heart Failure   History:         Patient has prior history of Echocardiogram examinations, most recent 11/11/2023. CAD, TAVR. Left Carotid Endarterectomy. and Prior CABG, Stroke; Risk Factors:Dyslipidemia, Hypertension, Sleep Apnea and Former Smoker. Pulmonary embolism. Aortic Valve: 23 mm Sapien prosthetic, stented (TAVR) valve is present in the aortic position. Procedure Date: 01/19/2017.   Sonographer:     Carl Coma RDCS Referring Phys:  [id number] [facility] PAUDEL Diagnosing Phys: Caron Poser   IMPRESSIONS     1. Left ventricular ejection fraction, by estimation, is 50 to 55%. The left ventricle has low normal function. The left ventricle has no regional wall motion abnormalities. The left ventricular internal cavity size was mildly dilated. Left ventricular diastolic parameters are consistent with Grade II diastolic dysfunction (pseudonormalization). 2. Right ventricular systolic function was not well visualized. The right ventricular size is not well visualized. 3. Left atrial size was moderately dilated. 4. Right atrial size was mildly dilated. 5. The mitral valve is degenerative. Mild mitral valve regurgitation. Mild calcific mitral stenosis. Severe mitral annular calcification. 6. There is a 23 mm Sapien prosthetic (TAVR) valve present in the aortic position. Procedure Date: 01/19/2017. Echo findings are consistent with severe  stenosis and moderate perivalvular leak of the aortic prosthesis. Aortic valve mean gradient measures 54.0 mmHg. Aortic valve Vmax measures 4.52 m/s. DVI 0.13. 7. The inferior vena cava is dilated in size with >50% respiratory variability, suggesting right atrial pressure of 8 mmHg.   Comparison(s): A prior study was performed on 11/11/2023. TAVR indices now meet threshold for severe stenosis. Vmax previously 3.64, MG 32 mmHg, DVI 0.26. Recommend valve team consult.   FINDINGS Left Ventricle: Left ventricular ejection fraction, by estimation, is 50 to 55%. The left ventricle has low normal function. The left ventricle has no regional wall motion abnormalities. Definity  contrast agent was given IV to delineate the left ventricular endocardial borders. The left ventricular internal cavity size was mildly  dilated. There is no left ventricular hypertrophy. Left ventricular diastolic parameters are consistent with Grade II diastolic dysfunction (pseudonormalization).   Right Ventricle: The right ventricular size is not well visualized. Right vetricular wall thickness was not well visualized. Right ventricular systolic function was not well visualized.   Left Atrium: Left atrial size was moderately dilated.   Right Atrium: Right atrial size was mildly dilated.   Pericardium: There is no evidence of pericardial effusion.   Mitral Valve: The mitral valve is degenerative in appearance. There is severe calcification of the mitral valve leaflet(s). Moderate mitral annular calcification. Mild mitral valve regurgitation. Mild mitral valve stenosis.   Tricuspid Valve: The tricuspid valve is not well visualized. Tricuspid valve regurgitation is not demonstrated. No evidence of tricuspid stenosis.   There is a 23 mm Sapien prosthetic, stented (TAVR) valve present in the aortic position. Procedure Date: 01/19/2017. Pulmonic Valve: The pulmonic valve was not well visualized. Pulmonic valve regurgitation is not  visualized. No evidence of pulmonic stenosis.   Aorta: The aortic root is normal in size and structure.   Venous: The inferior vena cava is dilated in size with greater than 50% respiratory variability, suggesting right atrial pressure of 8 mmHg.   IAS/Shunts: The interatrial septum was not well visualized.     LEFT VENTRICLE PLAX 2D LVIDd:         6.30 cm Diastology LVIDs:         5.10 cm LV e' medial:    4.57 cm/s LV PW:         1.20 cm LV E/e' medial:  30.4 LV IVS:        0.70 cm LV e' lateral:   4.86 cm/s LV E/e' lateral: 28.6     RIGHT VENTRICLE             IVC RV Basal diam:  5.20 cm     IVC diam: 2.10 cm RV S prime:     10.09 cm/s TAPSE (M-mode): 2.2 cm   LEFT ATRIUM             Index        RIGHT ATRIUM           Index LA diam:        5.80 cm 2.85 cm/m   RA Area:     18.30 cm LA Vol (A2C):   99.2 ml 48.69 ml/m  RA Volume:   53.50 ml  26.26 ml/m LA Vol (A4C):   79.4 ml 38.97 ml/m LA Biplane Vol: 90.8 ml 44.56 ml/m AORTIC VALVE AV Vmax:           452.00 cm/s AV Vmean:          331.600 cm/s AV VTI:            0.942 m AV Peak Grad:      81.7 mmHg AV Mean Grad:      54.0 mmHg LVOT Vmax:         56.93 cm/s LVOT Vmean:        38.600 cm/s LVOT VTI:          0.108 m LVOT/AV VTI ratio: 0.12 AI PHT:            199 msec   AORTA Ao Root diam: 2.90 cm   MITRAL VALVE MV Area (PHT): 4.26 cm     SHUNTS MV Decel Time: 178 msec     Systemic VTI: 0.11 m MR Peak grad: 145.7 mmHg MR Mean grad: 85.5  mmHg MR Vmax:      603.50 cm/s MR Vmean:     430.0 cm/s MV E velocity: 139.00 cm/s MV A velocity: 73.85 cm/s MV E/A ratio:  1.88   Caron Poser Electronically signed by Caron Poser Signature Date/Time: 02/02/2024/9:07:08 AM       Final   TEE   ECHO TEE 11/21/2018   Narrative TRANSESOPHOGEAL ECHO REPORT       Patient Name:   CORIN TILLY Date of Exam: 11/21/2018 Medical Rec #:  [id number]   Height:       66.0 in Accession #:    [id number]  Weight:        226.0 lb Date of Birth:  1940/09/10   BSA:          2.11 m Patient Age:    78 years    BP:           149/52 mmHg Patient Gender: M           HR:           65 bpm. Exam Location:  [facility]     Procedure: Transesophageal Echo, Color Doppler, Cardiac Doppler and Saline Contrast Bubble Study   Indications:     Bateremia 790.7   History:         Patient has prior history of Echocardiogram examinations, most recent 11/17/2018. Stroke Risk Factors:Hypertension. TAVR.   Sonographer:     Christopher Furnace RDCS (AE) Referring Phys:  [id number] BERNARDINO HERO DUNN Diagnosing Phys: Deatrice Cage MD       PROCEDURE: No evidence of vegetation. After discussion of the risks and benefits of a TEE, an informed consent was obtained from the patient. Local oropharyngeal anesthetic was provided with Benzocaine  spray and viscous lidocaine . The transesophogeal probe was passed through the esophogus of the patient. Imaged were obtained with the patient in a left lateral decubitus position. Image quality was good. The patient's vital signs; including heart rate, blood pressure, and oxygen saturation; remained stable throughout the procedure. The patient developed no complications during the procedure.   IMPRESSIONS     1. Left ventricular ejection fraction, by visual estimation, is 60 to 65%. The left ventricle has normal function. Normal left ventricular size. There is mildly increased left ventricular hypertrophy. 2. Left ventricular diastolic function could not be evaluated pattern of LV diastolic filling. 3. Global right ventricle has normal systolic function.The right ventricular size is normal. No increase in right ventricular wall thickness. 4. Left atrial size was normal. 5. Right atrial size was normal. 6. The mitral valve is grossly normal. No evidence of mitral valve regurgitation. Mild mitral stenosis. 7. The tricuspid valve is normal in structure. Tricuspid valve regurgitation is trivial. 8. Aortic valve  regurgitation is trivial by color flow Doppler. Normal functioning TAVR prosthesis with trivial perivalvular regurgitation. 9. Structurally normal aortic valve, with no evidence of sclerosis or stenosis. 10. The pulmonic valve was normal in structure. Pulmonic valve regurgitation is not visualized by color flow Doppler. 11. TR signal is inadequate for assessing pulmonary artery systolic pressure. 12. No vegetations to suggest endocarditis.   FINDINGS Left Ventricle: Left ventricular ejection fraction, by visual estimation, is 60 to 65%. The left ventricle has normal function. There is mildly increased left ventricular hypertrophy. Normal left ventricular size. Spectral Doppler shows Left ventricular diastolic function could not be evaluated pattern of LV diastolic filling.   Right Ventricle: The right ventricular size is normal. No increase in right ventricular wall thickness. Global RV systolic function  is has normal systolic function.   Left Atrium: Left atrial size was normal in size.   Right Atrium: Right atrial size was normal in size   Pericardium: There is no evidence of pericardial effusion.   Mitral Valve: The mitral valve is grossly normal. Mild mitral valve stenosis by observation. No evidence of mitral valve regurgitation.   Tricuspid Valve: The tricuspid valve is normal in structure. Tricuspid valve regurgitation is trivial by color flow Doppler.   Aortic Valve: The aortic valve has been repaired/replaced. Aortic valve regurgitation is trivial by color flow Doppler. The aortic valve is structurally normal, with no evidence of sclerosis or stenosis. TAVR valve is present in the aortic position.   Pulmonic Valve: The pulmonic valve was normal in structure. Pulmonic valve regurgitation is not visualized by color flow Doppler.   Aorta: The aortic root, ascending aorta and aortic arch are all structurally normal, with no evidence of dilitation or obstruction.   Venous: The  inferior vena cava was not well visualized.   Shunts: Agitated saline contrast was given intravenously to evaluate for intracardiac shunting. No ventricular septal defect is seen or detected. There is no evidence of an atrial septal defect. No atrial level shunt detected by color flow Doppler.     Deatrice Cage MD Electronically signed by Deatrice Cage MD Signature Date/Time: 11/21/2018/9:19:18 AM       Final   MONITORS   LONG TERM MONITOR (3-14 DAYS) 03/13/2020   Narrative Event Monitor Patch Wear Time:  14 days and 0 hours (2022-01-12T09:55:11-498 to 2022-01-26T09:55:14-0500)   Normal sinus rhythm Patient had a min HR of 43 bpm and avg HR of 60 bpm.   1 run of Ventricular Tachycardia occurred lasting 6 beats with a max rate of 266 bpm (avg 238 bpm).   11 Supraventricular Tachycardia runs occurred, the run with the fastest interval lasting 5 beats with a max rate of 174 bpm, the longest lasting 9 beats with an avg rate of 112 bpm.   Isolated SVEs were rare (<1.0%), SVE Couplets were rare (<1.0%), and SVE Triplets were rare (<1.0%). Isolated VEs were rare (<1.0%), VE Couplets were rare (<1.0%), and no VE Triplets were present. Ventricular Bigeminy and Trigeminy were present.   Patient triggered events were not associated with significant arrhythmia   Signed, Velinda Lunger, MD, Ph.D [facility]   CT SCANS   CT CORONARY [facility] W/CTA COR W/SCORE 02/07/2024   Narrative CLINICAL DATA:  Aortic Valve pathology with assessment for TAVR   EXAM: Cardiac TAVR CT   TECHNIQUE: The patient was scanned on a Siemens Force 192 slice scanner. A 120 kV retrospective scan was triggered in the descending thoracic aorta at 111 HU's. Gantry rotation speed was 270 msecs and collimation was .9 mm. No beta blockade or nitro were given. The 3D data set was reconstructed in 5% intervals of the R-R cycle. Systolic and diastolic phases were analyzed on a dedicated work station using MPR, MIP  and VRT modes. The patient received 100 cc of contrast.   FINDINGS: Prosthetic Valve: 23 mm Edward Sapien valve   Hypoattenuation and degree: Calcification of the noncoronary cusp prosthetic leaflet and base of the left cusp prosthetic leaflet.   Valve to coronary distance (left main) - 3 mm.   Valve to coronary distance (RCA) - 4 mm.   Severe Annular calcification (Calcification into the LVOT).   No paravalvular leak.   Mitral Valve: Anterior valve leaflet calcification into the intervalvular fibrosa   Internal diameter  Major annulus diameter: 21 mm   Minor annulus diameter: 19 mm   Aortic Measurements- 95% phase   Sinotubular Junction: 29 mm   Ascending Thoracic Aorta: 29 mm   Descending Thoracic Aorta: 26 mm   Sinus of Valsalva Measurements:   Right coronary cusp width: 31 mm   Left coronary cusp width: 29 mm   Non coronary cusp width: 30 mm   Optimum Fluoroscopic Angle for Delivery: LAO 4, CAU 6   Prosthetic Valve- Left Main Coronary angle: RAO 0, CAU 11   Scenes are saved for both a 23 mm Sapien Valve and a 26 mm Sapien Valve (RCA VTC 5 mm, LVOT area 5.44 cm2)   Non TAVR Valve Findings:   Coronary Arteries: Normal coronary origin. Study not completed with nitroglycerin . Incomplete evaluation-LIMA to LAD is distally patent.   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 No evidence of interatrial communication. Consider left arm Bubble study if clinically indicated.   Main Pulmonary artery: Moderate dilation 33 mm.   Pulmonary veins: Normal variant right middle vein.   Left atrial appendage: Patent.   Interatrial septum: No clear communication.   Chamber dimensions: Biventricular dilation.   Pericardium: No calcification.   Extra Cardiac Findings as per separate reporting.   Notable artifacts: Incomplete opacification   Image quality: Fair   IMPRESSION: 1. Bioprosthetic Aortic stenosis. Findings pertinent to TAVR procedure are  detailed above.   RECOMMENDATIONS:   The proposed cut-off value of 1,651 AU yielded a 93 % sensitivity and 75 % specificity in grading AS severity in patients with classical low-flow, low-gradient AS. Proposed different cut-off values to define severe AS for men and women as 2,065 AU and 1,274 AU, respectively. The joint European and American recommendations for the assessment of AS consider the aortic valve calcium  score as a continuum - a very high calcium  score suggests severe AS and a low calcium  score suggests severe AS is unlikely.   Donney VEAR Jarome LULLA Stephen RENETTE, et al. 2017 ESC/EACTS Guidelines for the management of valvular heart disease. Eur Heart J [phone]   Coronary artery calcium  (CAC) score is a strong predictor of incident coronary heart disease (CHD) and provides predictive information beyond traditional risk factors. CAC scoring is reasonable to use in the decision to withhold, postpone, or initiate statin therapy in intermediate-risk or selected borderline-risk asymptomatic adults (age 69-75 years and LDL-C >=70 to <190 mg/dL) who do not have diabetes or established atherosclerotic cardiovascular disease (ASCVD).* In intermediate-risk (10-year ASCVD risk >=7.5% to <20%) adults or selected borderline-risk (10-year ASCVD risk >=5% to <7.5%) adults in whom a CAC score is measured for the purpose of making a treatment decision the following recommendations have been made:   If CAC = 0, it is reasonable to withhold statin therapy and reassess in 5 to 10 years, as long as higher risk conditions are absent (diabetes mellitus, family history of premature CHD in first degree relatives (males <55 years; females <65 years), cigarette smoking, LDL >=190 mg/dL or other independent risk factors).   If CAC is 1 to 99, it is reasonable to initiate statin therapy for patients >=64 years of age.   If CAC is >=100 or >=75th percentile, it is reasonable to  initiate statin therapy at any age.   Cardiology referral should be considered for patients with CAC scores >=400 or >=75th percentile.   *2018 AHA/ACC/AACVPR/AAPA/ABC/ACPM/ADA/AGS/APhA/ASPC/NLA/PCNA Guideline on the Management of Blood Cholesterol: A Report of the [employer] of Cardiology/American Heart Association Task Force  on Clinical Practice Guidelines. J Am Coll Cardiol. 2019;73(24):3168-3209.   Mahesh  Chandrasekhar     Electronically Signed By: Stanly Leavens M.D. On: 02/07/2024 14:24       ______________________________________________________________________________________________       ECG sinus    I have independently reviewed the above radiologic studies and discussed with the patient    Recent Lab Findings: Recent Labs       Lab Results  Component Value Date    WBC 7.3 02/09/2024    HGB 12.8 (L) 02/09/2024    HCT 38.0 (L) 02/09/2024    PLT 148 (L) 02/09/2024    GLUCOSE 87 02/09/2024    CHOL 133 10/07/2021    TRIG 133 10/07/2021    HDL 43 10/07/2021    LDLCALC 67 10/07/2021    ALT 17 02/02/2024    AST 31 02/02/2024    NA 140 02/09/2024    K 4.2 02/09/2024    CL 97 (L) 02/09/2024    CREATININE 1.50 (H) 02/09/2024    BUN 22 02/09/2024    CO2 35 (H) 02/09/2024    TSH 0.592 07/18/2021    INR 1.1 02/09/2024    HGBA1C 5.9 (H) 10/07/2021            Assessment / Plan:   84 y.o. male with severe aortic stenosis.  STS score: 5.854.  NYHA Class IV.  The risks and benefits of transfemoral valve in valve TAVR were discussed in detail.  We also discussed possibility of an emergent sternotomy to address any procedural complications.  Based on our discussion, we collectively decided that an emergent sternotomy would not be indicated.  The patient is agreeable to proceed.  Based on my review of her LHC, echo, and CTA, I agree with the multidisciplinary plan to proceed with a Medtronic valve in valveTAVR.           Harrell O Lightfoot "

## 2024-02-29 ENCOUNTER — Encounter (HOSPITAL_COMMUNITY): Payer: Self-pay | Admitting: Cardiovascular Disease

## 2024-02-29 ENCOUNTER — Other Ambulatory Visit: Payer: Self-pay

## 2024-02-29 ENCOUNTER — Inpatient Hospital Stay (HOSPITAL_COMMUNITY)

## 2024-02-29 ENCOUNTER — Inpatient Hospital Stay (HOSPITAL_COMMUNITY)
Admission: RE | Admit: 2024-02-29 | Discharge: 2024-03-02 | DRG: 266 | Disposition: A | Discharge status: Home / Self Care | Attending: Cardiovascular Disease | Admitting: Cardiovascular Disease

## 2024-02-29 ENCOUNTER — Encounter (HOSPITAL_COMMUNITY)
Admission: RE | Disposition: A | Discharge status: Home / Self Care | Payer: Self-pay | Source: Home / Self Care | Attending: Cardiovascular Disease

## 2024-02-29 ENCOUNTER — Inpatient Hospital Stay (HOSPITAL_COMMUNITY): Payer: Self-pay | Admitting: Physician Assistant

## 2024-02-29 ENCOUNTER — Inpatient Hospital Stay (HOSPITAL_COMMUNITY): Payer: Self-pay | Admitting: Certified Registered Nurse Anesthetist

## 2024-02-29 DIAGNOSIS — F05 Delirium due to known physiological condition: Secondary | ICD-10-CM | POA: Diagnosis not present

## 2024-02-29 DIAGNOSIS — Z86711 Personal history of pulmonary embolism: Secondary | ICD-10-CM

## 2024-02-29 DIAGNOSIS — I350 Nonrheumatic aortic (valve) stenosis: Secondary | ICD-10-CM | POA: Diagnosis present

## 2024-02-29 DIAGNOSIS — I447 Left bundle-branch block, unspecified: Secondary | ICD-10-CM | POA: Diagnosis present

## 2024-02-29 DIAGNOSIS — T41205A Adverse effect of unspecified general anesthetics, initial encounter: Secondary | ICD-10-CM | POA: Diagnosis not present

## 2024-02-29 DIAGNOSIS — Z952 Presence of prosthetic heart valve: Secondary | ICD-10-CM | POA: Diagnosis not present

## 2024-02-29 DIAGNOSIS — R4182 Altered mental status, unspecified: Secondary | ICD-10-CM

## 2024-02-29 DIAGNOSIS — I5033 Acute on chronic diastolic (congestive) heart failure: Secondary | ICD-10-CM | POA: Diagnosis present

## 2024-02-29 DIAGNOSIS — Z7901 Long term (current) use of anticoagulants: Secondary | ICD-10-CM | POA: Diagnosis not present

## 2024-02-29 DIAGNOSIS — J9611 Chronic respiratory failure with hypoxia: Secondary | ICD-10-CM | POA: Diagnosis not present

## 2024-02-29 DIAGNOSIS — I2510 Atherosclerotic heart disease of native coronary artery without angina pectoris: Secondary | ICD-10-CM | POA: Diagnosis present

## 2024-02-29 DIAGNOSIS — T82857A Stenosis of cardiac prosthetic devices, implants and grafts, initial encounter: Principal | ICD-10-CM | POA: Diagnosis present

## 2024-02-29 DIAGNOSIS — G4733 Obstructive sleep apnea (adult) (pediatric): Secondary | ICD-10-CM | POA: Diagnosis present

## 2024-02-29 DIAGNOSIS — T50905A Adverse effect of unspecified drugs, medicaments and biological substances, initial encounter: Secondary | ICD-10-CM | POA: Diagnosis not present

## 2024-02-29 DIAGNOSIS — Z6832 Body mass index (BMI) 32.0-32.9, adult: Secondary | ICD-10-CM | POA: Diagnosis not present

## 2024-02-29 DIAGNOSIS — Z888 Allergy status to other drugs, medicaments and biological substances status: Secondary | ICD-10-CM

## 2024-02-29 DIAGNOSIS — J3800 Paralysis of vocal cords and larynx, unspecified: Secondary | ICD-10-CM | POA: Diagnosis present

## 2024-02-29 DIAGNOSIS — Z8249 Family history of ischemic heart disease and other diseases of the circulatory system: Secondary | ICD-10-CM

## 2024-02-29 DIAGNOSIS — Y712 Prosthetic and other implants, materials and accessory cardiovascular devices associated with adverse incidents: Secondary | ICD-10-CM | POA: Diagnosis present

## 2024-02-29 DIAGNOSIS — Z7982 Long term (current) use of aspirin: Secondary | ICD-10-CM

## 2024-02-29 DIAGNOSIS — D696 Thrombocytopenia, unspecified: Secondary | ICD-10-CM | POA: Diagnosis present

## 2024-02-29 DIAGNOSIS — J918 Pleural effusion in other conditions classified elsewhere: Secondary | ICD-10-CM | POA: Diagnosis present

## 2024-02-29 DIAGNOSIS — D62 Acute posthemorrhagic anemia: Secondary | ICD-10-CM | POA: Diagnosis not present

## 2024-02-29 DIAGNOSIS — I110 Hypertensive heart disease with heart failure: Secondary | ICD-10-CM

## 2024-02-29 DIAGNOSIS — Z79899 Other long term (current) drug therapy: Secondary | ICD-10-CM

## 2024-02-29 DIAGNOSIS — Z87442 Personal history of urinary calculi: Secondary | ICD-10-CM

## 2024-02-29 DIAGNOSIS — J90 Pleural effusion, not elsewhere classified: Secondary | ICD-10-CM | POA: Diagnosis not present

## 2024-02-29 DIAGNOSIS — F03B0 Unspecified dementia, moderate, without behavioral disturbance, psychotic disturbance, mood disturbance, and anxiety: Secondary | ICD-10-CM | POA: Diagnosis present

## 2024-02-29 DIAGNOSIS — Z9049 Acquired absence of other specified parts of digestive tract: Secondary | ICD-10-CM

## 2024-02-29 DIAGNOSIS — Z8673 Personal history of transient ischemic attack (TIA), and cerebral infarction without residual deficits: Secondary | ICD-10-CM

## 2024-02-29 DIAGNOSIS — G9389 Other specified disorders of brain: Secondary | ICD-10-CM | POA: Diagnosis present

## 2024-02-29 DIAGNOSIS — Z823 Family history of stroke: Secondary | ICD-10-CM

## 2024-02-29 DIAGNOSIS — R0689 Other abnormalities of breathing: Secondary | ICD-10-CM

## 2024-02-29 DIAGNOSIS — Z7951 Long term (current) use of inhaled steroids: Secondary | ICD-10-CM

## 2024-02-29 DIAGNOSIS — Y831 Surgical operation with implant of artificial internal device as the cause of abnormal reaction of the patient, or of later complication, without mention of misadventure at the time of the procedure: Secondary | ICD-10-CM | POA: Diagnosis present

## 2024-02-29 DIAGNOSIS — E870 Hyperosmolality and hypernatremia: Secondary | ICD-10-CM | POA: Diagnosis present

## 2024-02-29 DIAGNOSIS — L570 Actinic keratosis: Secondary | ICD-10-CM | POA: Diagnosis present

## 2024-02-29 DIAGNOSIS — N400 Enlarged prostate without lower urinary tract symptoms: Secondary | ICD-10-CM | POA: Diagnosis present

## 2024-02-29 DIAGNOSIS — Z86718 Personal history of other venous thrombosis and embolism: Secondary | ICD-10-CM

## 2024-02-29 DIAGNOSIS — T8209XA Other mechanical complication of heart valve prosthesis, initial encounter: Secondary | ICD-10-CM

## 2024-02-29 DIAGNOSIS — G9341 Metabolic encephalopathy: Secondary | ICD-10-CM | POA: Diagnosis present

## 2024-02-29 DIAGNOSIS — T82857D Stenosis of cardiac prosthetic devices, implants and grafts, subsequent encounter: Secondary | ICD-10-CM

## 2024-02-29 DIAGNOSIS — Z87891 Personal history of nicotine dependence: Secondary | ICD-10-CM

## 2024-02-29 DIAGNOSIS — G4730 Sleep apnea, unspecified: Secondary | ICD-10-CM | POA: Diagnosis present

## 2024-02-29 DIAGNOSIS — E876 Hypokalemia: Secondary | ICD-10-CM | POA: Diagnosis present

## 2024-02-29 DIAGNOSIS — Z85828 Personal history of other malignant neoplasm of skin: Secondary | ICD-10-CM

## 2024-02-29 DIAGNOSIS — K219 Gastro-esophageal reflux disease without esophagitis: Secondary | ICD-10-CM | POA: Diagnosis present

## 2024-02-29 DIAGNOSIS — I10 Essential (primary) hypertension: Secondary | ICD-10-CM | POA: Diagnosis present

## 2024-02-29 DIAGNOSIS — G928 Other toxic encephalopathy: Secondary | ICD-10-CM

## 2024-02-29 DIAGNOSIS — I639 Cerebral infarction, unspecified: Secondary | ICD-10-CM | POA: Diagnosis not present

## 2024-02-29 DIAGNOSIS — R410 Disorientation, unspecified: Secondary | ICD-10-CM | POA: Diagnosis not present

## 2024-02-29 DIAGNOSIS — E785 Hyperlipidemia, unspecified: Secondary | ICD-10-CM | POA: Diagnosis present

## 2024-02-29 DIAGNOSIS — J9612 Chronic respiratory failure with hypercapnia: Secondary | ICD-10-CM | POA: Diagnosis present

## 2024-02-29 DIAGNOSIS — Z006 Encounter for examination for normal comparison and control in clinical research program: Secondary | ICD-10-CM | POA: Diagnosis not present

## 2024-02-29 DIAGNOSIS — Z8701 Personal history of pneumonia (recurrent): Secondary | ICD-10-CM

## 2024-02-29 DIAGNOSIS — M159 Polyosteoarthritis, unspecified: Secondary | ICD-10-CM | POA: Diagnosis present

## 2024-02-29 DIAGNOSIS — Z951 Presence of aortocoronary bypass graft: Secondary | ICD-10-CM

## 2024-02-29 HISTORY — PX: INTRAOPERATIVE TRANSTHORACIC ECHOCARDIOGRAM: SHX6523

## 2024-02-29 HISTORY — DX: Unspecified dementia, unspecified severity, without behavioral disturbance, psychotic disturbance, mood disturbance, and anxiety: F03.90

## 2024-02-29 HISTORY — DX: Disorientation, unspecified: R41.0

## 2024-02-29 LAB — ECHOCARDIOGRAM LIMITED
AR max vel: 2.12 cm2
AV Area VTI: 2.24 cm2
AV Area mean vel: 2.13 cm2
AV Mean grad: 7 mmHg
AV Peak grad: 13.1 mmHg
Ao pk vel: 1.81 m/s
Area-P 1/2: 3.42 cm2
Height: 66 in
P 1/2 time: 276 ms
S' Lateral: 4.2 cm
Single Plane A4C EF: 36.6 %
Weight: 3368.63 [oz_av]

## 2024-02-29 LAB — BLOOD GAS, ARTERIAL
Acid-Base Excess: 5 mmol/L — ABNORMAL HIGH (ref 0.0–2.0)
Bicarbonate: 31.8 mmol/L — ABNORMAL HIGH (ref 20.0–28.0)
O2 Saturation: 100 %
Patient temperature: 37
pCO2 arterial: 55 mmHg — ABNORMAL HIGH (ref 32–48)
pH, Arterial: 7.37 (ref 7.35–7.45)
pO2, Arterial: 192 mmHg — ABNORMAL HIGH (ref 83–108)

## 2024-02-29 LAB — POCT I-STAT, CHEM 8
BUN: 23 mg/dL (ref 8–23)
Calcium, Ion: 1.09 mmol/L — ABNORMAL LOW (ref 1.15–1.40)
Chloride: 100 mmol/L (ref 98–111)
Creatinine, Ser: 1 mg/dL (ref 0.61–1.24)
Glucose, Bld: 80 mg/dL (ref 70–99)
HCT: 31 % — ABNORMAL LOW (ref 39.0–52.0)
Hemoglobin: 10.5 g/dL — ABNORMAL LOW (ref 13.0–17.0)
Potassium: 3.3 mmol/L — ABNORMAL LOW (ref 3.5–5.1)
Sodium: 148 mmol/L — ABNORMAL HIGH (ref 135–145)
TCO2: 27 mmol/L (ref 22–32)

## 2024-02-29 LAB — GLUCOSE, CAPILLARY: Glucose-Capillary: 80 mg/dL (ref 70–99)

## 2024-02-29 LAB — POCT ACTIVATED CLOTTING TIME: Activated Clotting Time: 255 s

## 2024-02-29 MED ORDER — MORPHINE SULFATE (PF) 2 MG/ML IV SOLN: 1.0000 mg | INTRAVENOUS | Status: DC | PRN

## 2024-02-29 MED ORDER — LIDOCAINE HCL (PF) 1 % IJ SOLN
INTRAMUSCULAR | Status: DC | PRN
  Administered 2024-02-29: 20 mL

## 2024-02-29 MED ORDER — HEPARIN (PORCINE) IN NACL 1000-0.9 UT/500ML-% IV SOLN
INTRAVENOUS | Status: DC | PRN
  Administered 2024-02-29: 1500 mL via SURGICAL_CAVITY

## 2024-02-29 MED ORDER — DOCUSATE SODIUM 100 MG PO CAPS: 100.0000 mg | ORAL_CAPSULE | Freq: Every day | ORAL | Status: DC | PRN

## 2024-02-29 MED ORDER — ONDANSETRON HCL 4 MG/2ML IJ SOLN
4.0000 mg | Freq: Four times a day (QID) | INTRAMUSCULAR | Status: DC | PRN
  Administered 2024-03-02: 4 mg via INTRAVENOUS
  Filled 2024-02-29: qty 2

## 2024-02-29 MED ORDER — MONTELUKAST SODIUM 10 MG PO TABS: 10.0000 mg | ORAL_TABLET | Freq: Every evening | ORAL | Status: DC | PRN

## 2024-02-29 MED ORDER — NOREPINEPHRINE 4 MG/250ML-% IV SOLN: 0.0000 ug/min | INTRAVENOUS | Status: DC

## 2024-02-29 MED ORDER — NALOXONE HCL 0.4 MG/ML IJ SOLN: 0.4000 mg | Freq: Once | INTRAMUSCULAR | Status: DC

## 2024-02-29 MED ORDER — FUROSEMIDE 10 MG/ML IJ SOLN
40.0000 mg | Freq: Two times a day (BID) | INTRAMUSCULAR | Status: DC
  Administered 2024-03-01 – 2024-03-02 (×4): 40 mg via INTRAVENOUS
  Filled 2024-02-29 (×4): qty 4

## 2024-02-29 MED ORDER — CLEVIDIPINE BUTYRATE 0.5 MG/ML IV EMUL
INTRAVENOUS | Status: DC | PRN
  Administered 2024-02-29: 3 mg/h via INTRAVENOUS

## 2024-02-29 MED ORDER — RAMELTEON 8 MG PO TABS
8.0000 mg | ORAL_TABLET | Freq: Every day | ORAL | Status: DC
  Administered 2024-03-01: 8 mg via ORAL
  Filled 2024-02-29 (×2): qty 1

## 2024-02-29 MED ORDER — SODIUM CHLORIDE 0.9 % IV SOLN: INTRAVENOUS | Status: DC

## 2024-02-29 MED ORDER — PANTOPRAZOLE SODIUM 40 MG PO TBEC
40.0000 mg | DELAYED_RELEASE_TABLET | Freq: Every day | ORAL | Status: DC
  Administered 2024-03-01 – 2024-03-02 (×2): 40 mg via ORAL
  Filled 2024-02-29 (×2): qty 1

## 2024-02-29 MED ORDER — CHLORHEXIDINE GLUCONATE 4 % EX SOLN: 60.0000 mL | Freq: Once | CUTANEOUS | Status: DC

## 2024-02-29 MED ORDER — OXYCODONE HCL 5 MG PO TABS: 5.0000 mg | ORAL_TABLET | Freq: Four times a day (QID) | ORAL | Status: DC | PRN

## 2024-02-29 MED ORDER — ASPIRIN 81 MG PO CHEW
324.0000 mg | CHEWABLE_TABLET | Freq: Once | ORAL | Status: AC
  Administered 2024-02-29: 324 mg via ORAL
  Filled 2024-02-29: qty 4

## 2024-02-29 MED ORDER — NITROGLYCERIN IN D5W 200-5 MCG/ML-% IV SOLN: 0.0000 ug/min | INTRAVENOUS | Status: DC

## 2024-02-29 MED ORDER — NALOXONE HCL 2 MG/2ML IJ SOSY
1.0000 mg | PREFILLED_SYRINGE | Freq: Once | INTRAMUSCULAR | Status: DC
  Filled 2024-02-29: qty 2

## 2024-02-29 MED ORDER — CHLORHEXIDINE GLUCONATE 4 % EX SOLN: 30.0000 mL | CUTANEOUS | Status: DC

## 2024-02-29 MED ORDER — NALOXONE HCL 2 MG/2ML IJ SOSY
1.0000 mg | PREFILLED_SYRINGE | Freq: Once | INTRAMUSCULAR | Status: AC
  Administered 2024-02-29: 1 mg via INTRAVENOUS
  Filled 2024-02-29: qty 2

## 2024-02-29 MED ORDER — ACETAMINOPHEN 325 MG PO TABS: 650.0000 mg | ORAL_TABLET | Freq: Four times a day (QID) | ORAL | Status: DC | PRN

## 2024-02-29 MED ORDER — IPRATROPIUM-ALBUTEROL 0.5-2.5 (3) MG/3ML IN SOLN
3.0000 mL | Freq: Two times a day (BID) | RESPIRATORY_TRACT | Status: DC
  Administered 2024-02-29 – 2024-03-01 (×2): 3 mL via RESPIRATORY_TRACT
  Filled 2024-02-29 (×4): qty 3

## 2024-02-29 MED ORDER — NALOXONE HCL 0.4 MG/ML IJ SOLN
INTRAMUSCULAR | Status: AC
  Administered 2024-02-29: 0.4 mg
  Filled 2024-02-29: qty 1

## 2024-02-29 MED ORDER — TRAMADOL HCL 50 MG PO TABS: 50.0000 mg | ORAL_TABLET | Freq: Four times a day (QID) | ORAL | Status: DC | PRN

## 2024-02-29 MED ORDER — FENTANYL CITRATE (PF) 100 MCG/2ML IJ SOLN
INTRAMUSCULAR | Status: DC | PRN
  Administered 2024-02-29 (×3): 25 ug via INTRAVENOUS

## 2024-02-29 MED ORDER — POTASSIUM CHLORIDE 10 MEQ/100ML IV SOLN
10.0000 meq | INTRAVENOUS | Status: AC
  Filled 2024-02-29: qty 100

## 2024-02-29 MED ORDER — TRAMADOL HCL 50 MG PO TABS: 50.0000 mg | ORAL_TABLET | ORAL | Status: DC | PRN

## 2024-02-29 MED ORDER — CEFAZOLIN SODIUM-DEXTROSE 2-4 GM/100ML-% IV SOLN
2.0000 g | Freq: Three times a day (TID) | INTRAVENOUS | Status: AC
  Administered 2024-03-01 (×2): 2 g via INTRAVENOUS
  Filled 2024-02-29 (×2): qty 100

## 2024-02-29 MED ORDER — OXYCODONE HCL 5 MG PO TABS: 5.0000 mg | ORAL_TABLET | ORAL | Status: DC | PRN

## 2024-02-29 MED ORDER — FENTANYL CITRATE (PF) 100 MCG/2ML IJ SOLN
INTRAMUSCULAR | Status: AC
  Filled 2024-02-29: qty 2

## 2024-02-29 MED ORDER — SODIUM CHLORIDE 0.9 % IV SOLN: INTRAVENOUS | Status: DC | PRN

## 2024-02-29 MED ORDER — CHLORHEXIDINE GLUCONATE 0.12 % MT SOLN
15.0000 mL | Freq: Once | OROMUCOSAL | Status: AC
  Administered 2024-02-29: 15 mL via OROMUCOSAL
  Filled 2024-02-29: qty 15

## 2024-02-29 MED ORDER — SODIUM CHLORIDE 0.9% FLUSH: 3.0000 mL | INTRAVENOUS | Status: DC | PRN

## 2024-02-29 MED ORDER — ASPIRIN 325 MG PO TABS: 325.0000 mg | ORAL_TABLET | Freq: Every day | ORAL | Status: DC

## 2024-02-29 MED ORDER — ACETAMINOPHEN 650 MG RE SUPP: 650.0000 mg | Freq: Four times a day (QID) | RECTAL | Status: DC | PRN

## 2024-02-29 MED ORDER — LACTATED RINGERS IV SOLN: INTRAVENOUS | Status: AC

## 2024-02-29 MED ORDER — IOHEXOL 350 MG/ML SOLN
100.0000 mL | Freq: Once | INTRAVENOUS | Status: AC | PRN
  Administered 2024-02-29: 100 mL via INTRAVENOUS

## 2024-02-29 MED ORDER — HEPARIN SODIUM (PORCINE) 1000 UNIT/ML IJ SOLN
INTRAMUSCULAR | Status: DC | PRN
  Administered 2024-02-29: 16000 [IU] via INTRAVENOUS

## 2024-02-29 MED ORDER — ASPIRIN 81 MG PO CHEW: 81.0000 mg | CHEWABLE_TABLET | Freq: Every day | ORAL | Status: DC

## 2024-02-29 MED ORDER — SODIUM CHLORIDE 0.9% FLUSH
3.0000 mL | Freq: Two times a day (BID) | INTRAVENOUS | Status: DC
  Administered 2024-03-01 – 2024-03-02 (×2): 3 mL via INTRAVENOUS

## 2024-02-29 MED ORDER — OMEPRAZOLE MAGNESIUM 20 MG PO TBEC: 20.0000 mg | DELAYED_RELEASE_TABLET | Freq: Every day | ORAL | Status: DC

## 2024-02-29 MED ORDER — RAMELTEON 8 MG PO TABS
8.0000 mg | ORAL_TABLET | Freq: Every day | ORAL | Status: DC
  Filled 2024-02-29: qty 1

## 2024-02-29 MED ORDER — ROSUVASTATIN CALCIUM 20 MG PO TABS
20.0000 mg | ORAL_TABLET | Freq: Every day | ORAL | Status: DC
  Administered 2024-03-01 – 2024-03-02 (×2): 20 mg via ORAL
  Filled 2024-02-29 (×2): qty 1

## 2024-02-29 MED ORDER — SODIUM CHLORIDE 0.9 % IV SOLN: 250.0000 mL | INTRAVENOUS | Status: DC | PRN

## 2024-02-29 MED ORDER — POTASSIUM CHLORIDE 10 MEQ/100ML IV SOLN
10.0000 meq | INTRAVENOUS | Status: AC
  Administered 2024-03-01 (×4): 10 meq via INTRAVENOUS
  Filled 2024-02-29 (×3): qty 100

## 2024-02-29 NOTE — Progress Notes (Signed)
 Pt arrived to 4E 24 from PACU unresponsive to painful stimuli and sternal rub,  On call Cardiology PA paged at 1811 and enroute to bedside at 1826 PT given 0.2 of Narcan  x2 @1843   per Dr. Floretta with little improvement in mental Status. Code stroke activated @ 1838, Stroke team at bedside and pt transported to CT with Stroke RN.

## 2024-02-29 NOTE — Op Note (Signed)
 "   [facility]   MULTIDISCIPLINARY HEART VALVE TEAM     TAVR OPERATIVE NOTE   Chetan Mehring. Gabrielsen [id number]  Date of Procedure:                 02/29/2024   Preoperative Diagnosis:      Severe prosthetic aortic valve stenosis    Postoperative Diagnosis:    Same    Procedure:        Valve in ValveTranscatheter Aortic Valve Replacement - Percutaneous Right Transfemoral Approach             Medtronic Evolut FX + (size 26 mm, serial # [id number])              Co-Surgeons:            Dorise LOIS Fellers, MD and Lonni Cash, MD     Anesthesiologist:                  Maryclare, MD   Echocardiographer:              Croitoru, MD   Pre-operative Echo Findings: Severe prosthetic aortic stenosis  Normal left ventricular systolic function   Post-operative Echo Findings: Trivial paravalvular leak Normal left ventricular systolic function      BRIEF CLINICAL NOTE AND INDICATIONS FOR SURGERY    Jacob Montes is a 84 y.o. male presents for surgical evaluation with recent admission for acute decompensated heart failure likely secondary to rapidly progressive prosthetic aortic valve stenosis. He had a 23mm Edwards Sapien TAVR valve placed 01/2017 in [place] . He had two heart failure hospitalizations this month. He failed outpatient diuretic therapy, including outpatient IV diuresis. It was felt that valve in valve TAVR using a Medtronic Evolute FX+ valve was the best treatment to improve his heart failure and prevent further admissions for same.       DETAILS OF THE OPERATIVE PROCEDURE    PREPARATION:    The patient was brought to the operating room on the above mentioned date and appropriate monitoring was established by the anesthesia team. The patient was placed in the supine position on the operating table.  Intravenous antibiotics were administered. The patient was monitored closely throughout the procedure under conscious sedation.    Baseline transthoracic  echocardiogram was performed. The patient's abdomen and both groins were prepped and draped in a sterile manner. A time out procedure was performed.   PERIPHERAL ACCESS:    Using the modified Seldinger technique, femoral arterial and venous access was obtained with placement of 6 Fr sheaths on the left side.  A pigtail diagnostic catheter was passed through the left arterial sheath under fluoroscopic guidance into the aortic root.  A temporary transvenous pacemaker catheter was passed through the left femoral venous sheath under fluoroscopic guidance into the right ventricle.  The pacemaker was tested to ensure stable lead placement and pacemaker capture. Aortic root angiography was performed in order to determine the optimal angiographic angle for valve deployment.    TRANSFEMORAL ACCESS:    Percutaneous transfemoral access and sheath placement was performed using ultrasound guidance.  The right common femoral artery was cannulated using a micropuncture needle.  A pair of Abbott Perclose percutaneous closure devices were placed and a 6 French sheath replaced into the femoral artery.  The patient was heparinized systemically and ACT verified > 250 seconds.     An 18 Fr transfemoral Gore Dry-Seal sheath was introduced into the right femoral artery after progressively dilating  over an Amplatz superstiff wire. An AL-2 catheter was used to direct a straight-tip exchange length wire across the native aortic valve into the left ventricle. This was exchanged out for a pigtail catheter and position was confirmed in the LV apex. Simultaneous LV and Ao pressures were recorded.  The pigtail catheter was exchanged for a Safari wire in the LV apex.    BALLOON AORTIC VALVULOPLASTY:    Performed using a 22 mm True balloon under rapid ventricular pacing. Recovery was prompt.   TRANSCATHETER HEART VALVE DEPLOYMENT:    A Medtronic Evolut FX + transcatheter heart valve (size 26 mm) was prepared and loaded into the  delivery catheter system per manufacturer's guidelines and the proper orientation of the valve is confirmed under fluoroscopy. The fluroscopic angle was determined to remove paralax from the old Sapien valve. The delivery system and inline sheath were inserted into the right common femoral artery over the [facility] wire and the inline sheath advanced into the abdominal aorta under fluoroscopic guidance. The delivery catheter was advanced around the aortic arch and the valve was carefully positioned across the prosthetic aortic valve annulus.The valve was 80% deployed using the non-paralax angle under fluoroscopic guidance. Intermittent pacing was used during valve deployment. The patient was turned LAO and the position of the inflow of the Medtronic valve was felt to be satisfactory. The valve was fully deployed and seated well. The delivery system and guidewire were retracted into the descending aorta and the nosecone re-sheathed. Valve function was assessed using echocardiography. There is felt to be trivial paravalvular leak and no central aortic insufficiency. The patient's hemodynamic recovery following valve deployment is good.        PROCEDURE COMPLETION:    The delivery system and in-line sheath were removed and femoral artery closure performed using the Perclose devices.  Protamine was administered once femoral arterial repair was complete. The temporary pacemaker, pigtail catheters and femoral sheaths were removed with manual pressure used venous for hemostasis and a Mynx closure device for contralateral arterial hemostasis.    The patient tolerated the procedure well and is transported to the cath lab recovery area in stable condition. There were no immediate intraoperative complications. All sponge instrument and needle counts are verified correct at completion of the operation.    No blood products were administered during the operation.   The patient received a total of 0 mL of intravenous  contrast during the procedure.     Dorise MARLA Fellers, MD  "

## 2024-02-29 NOTE — Interval H&P Note (Signed)
 History and Physical Interval Note:  02/29/2024 3:16 PM  THUNDER BRIDGEWATER  has presented today for surgery, with the diagnosis of Severe Bioprosthetic Aortic Stenosis.  The various methods of treatment have been discussed with the patient and family. After consideration of risks, benefits and other options for treatment, the patient has consented to  Procedures: Transcatheter Aortic Valve Replacement, Transfemoral (N/A) ECHOCARDIOGRAM, TRANSTHORACIC (N/A) as a surgical intervention.  The patient's history has been reviewed, patient examined, no change in status, stable for surgery.  I have reviewed the patient's chart and labs.  Questions were answered to the patient's satisfaction.     Anagabriela Jokerst K Brieonna Crutcher

## 2024-02-29 NOTE — Progress Notes (Signed)
 Dr. Verlin made aware that the patient stopped taking Aspirin  81 mg on 02/25/23. Verbal order received to give Aspirin  325 mg po per Dr. Verlin.

## 2024-02-29 NOTE — Progress Notes (Signed)
" ° °  I went by the check on patient before leaving for the evening. He is still somnolent but much more alert than earlier this evening and able to answer questions. His wife and son are at bedside and I updated them on the events of this evening. I answered any questions they had.   Wife Lysbeth) asks to be called with updates tomorrow if she is not in the room when the rounding team comes by. Her number is [phone].  Deryn Massengale E Cleofas Hudgins, PA-C 02/29/2024 9:01 PM   "

## 2024-02-29 NOTE — Discharge Summary (Addendum)
 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) Active Problems:   Benign essential hypertension   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   Dyslipidemia   Sleep apnea   Hyperlipidemia   History of CAD   History of transcatheter aortic valve replacement (TAVR)   Morbid obesity (HCC)   Acute on chronic diastolic CHF (congestive heart failure), NYHA class 3 (HCC)   Prosthetic valve dysfunction   Aortic stenosis s/p TAVR   Acute metabolic encephalopathy   Hypokalemia   Pleural effusion   Left bundle branch block   Allergies Allergies[1]  Diagnostic Studies/Procedures    HEART AND VASCULAR CENTER  TAVR OPERATIVE NOTE     Date of Procedure:                02/29/2024   Preoperative Diagnosis:      Severe Aortic Stenosis    Postoperative Diagnosis:    Same    Procedure:        Transcatheter Aortic Valve Replacement - Transfemoral Approach             Medtronic Evolut FX Plus THV (size 26 mm, model # EVFXPLUS-26, serial # [id number] )              Co-Surgeons:                        Lonni Cash, MD and Dorise LOIS Fellers, MD    Anesthesiologist:                  Hodierne   Echocardiographer:              Croitoru   Pre-operative Echo Findings: Severe aortic stenosis Normal left ventricular systolic function   Post-operative Echo Findings: Trivial paravalvular leak Normal left ventricular systolic function _____________    Echo 03/01/24:  IMPRESSIONS  1. Left ventricular ejection fraction, by estimation, is 50 to 55%. The  left ventricle has low  normal function. The left ventricle demonstrates  global hypokinesis. The left ventricular internal cavity size was mildly  dilated. There is mild concentric  left ventricular hypertrophy. Left ventricular diastolic function could  not be evaluated.   2. Right ventricular systolic function is normal. The right ventricular  size is normal. Tricuspid regurgitation signal is inadequate for assessing  PA pressure.   3. Left atrial size was mildly dilated.   4. The mitral valve is degenerative. Mild mitral valve regurgitation. No  evidence of mitral stenosis. Severe mitral annular calcification.   5. Well-seated bioprosthetic aortic valve, status valve in valve ( TAVR  in TAVR). The aortic valve has been repaired/replaced. Aortic valve  regurgitation is not visualized. No aortic stenosis is present. There is a  26 mm stented (TAVR) valve present in  the aortic position. Procedure Date: 02/29/2024. Aortic valve mean gradient  measures 6.0 mmHg. Aortic valve Vmax measures 1.64 m/s.   6. The inferior vena cava is normal in size with greater than 50%  respiratory variability, suggesting right atrial pressure of 3 mmHg    History of Present Illness  Jacob Montes is a 84 y.o. 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   He has had multiple recent admissions with heart failure felt to be due to his bioprosthetic AVR failure. He was most recently admitted to [facility] with CHF on 02/01/24 and was transferred to [facility] for management. Echo 02/01/24 with LVEF=50-55%. His bioprosthetic AVR had severe stenosis and moderate perivalvular leak. Valve felt to be underexpanded. He was volume overloaded and was diuresed  with IV Lasix . Cardiac cath with patent grafts. He developed an aspiration pneumonia and had hospital delirium. We considered inpatient TAVR but this was cancelled due to his delirium. His mental status improved prior to discharge. He was discharged to a SNF.    The patient was evaluated by the multidisciplinary valve team and felt to have severe, symptomatic bioprosthetic TAVR valve stenosis and to be a suitable candidate for VIV TAVR inTAVR, which was set up for 02/29/24.  Hospital Course     Consultants: none   Severe bioprosthetic AS:  -- S/p VIV TAVR in TAVR with a 26 mm Medtronic FX THV via the TF approach on 02/29/24.  -- Post operative echo showed EF 50%, mild cLVH, severe MAC with mild MR,  normally functioning TAVR with a mean gradient of 6 mmHg and no PVL. -- Groin sites are stable.  -- Plan on Eliquis  5mg  BID x 3 months. Stop aspirin  81mg  daily.   -- Met with cardiac rehab to discuss CRP phase II.  -- Plan for discharge home today with close follow up in the outpatient setting.   New LBBB: -- Noted after TAVR. -- Will discharge home with Zio AT to rule out delayed HAVB.   Acute on chronic diastolic HF -- LVEDP 26 mm hg at the time of TAVR.  -- He was started on IV Lasix  40mg  BID. Net negative 4.7L.Weight down 15 lbs (210--> 195 lbs). -- Resume home Lasix  40mg  daily and KCL 20 meq daily.  -- BMET at follow up.  Bilateral pleural effusion: -- S/p R thoracentesis with 850 mL of cloudy amber fluid.    CAD s/p CABG '09 -- No chest pain -- [facility] 02/04/24 with significant underlying three-vessel coronary artery disease with patent grafts including LIMA to LAD, SVG to OM1 and SVG to OM 3. There was significant disease in the right coronary artery but the vessel is small and significant ostial left main stenosis supplying only a first diagonal branch. No targets for intervention.  -- Continue medical therapy with metoprolol  succinate 25 mg daily and Crestor  40 mg daily. Stop aspirin   given start of Eliquis  x 3 months.   Carotid stenosis s/p LCEA in 2009 c/b R laryngeal nerve paralysis: -- Continue medical therapy.    Dementia with hospital delirium: -- Has a long history of waxing and waning mental status. Very difficult to rouse post operatively and code stroke called. MRI showed small punctate infarcts in the left centrum semiovale and otherwise chronic disease.  -- Appreciate neuro assistance.  -- Now back to baseline neuro status.   Thrombocytopenia: -- Plts 158--> 108. -- Likely reactive.  _____________  Discharge Vitals Blood pressure (!) 145/53, pulse 78, temperature 98.4 F (36.9 C),  temperature source Oral, resp. rate 18, height 5' 6 (1.676 m), weight 88.6 kg, SpO2 96%.  Filed Weights   02/29/24 1259 03/01/24 0511 03/02/24 0330  Weight: 95.5 kg 92.7 kg 88.6 kg     GEN: Well nourished, well developed, in no acute distress.  HEENT: Grossly normal.  Neck: Supple, no JVD Cardiac: RRR, 2/6 Systolic murmur heard best at RUSB but can be heard throughout precordium. No rubs, or gallops. No clubbing, cyanosis, no pretibial edema.   Respiratory:  Respirations regular and unlabored, clear to auscultation bilaterally. GI: Soft, nontender, nondistended, BS + x 4. MS: no deformity or atrophy. Skin: warm and dry, no rash.  Groin sites clear without hematoma or ecchymosis  Neuro:  Strength and sensation are intact. Psych: AAOx3.  Normal affect.   Disposition   Pt is being discharged home today in good condition.  Follow-up Plans & Appointments     Follow-up Information     Sebastian Lamarr SAUNDERS, PA-C. Go on 03/09/2024.   Specialties: Cardiology, Radiology Why: @ 1:05 am, please arrive at least 20 minutes early. Contact information: [address] [place] [address] [phone]                  Discharge Medications   Allergies as of 03/02/2024       Reactions   Atorvastatin Other (See Comments)   aches myalgia myalgia     aches  myalgia   Ramipril Other (See Comments), Cough   cough        Medication List     STOP taking these medications    aspirin  81 MG chewable tablet       TAKE these medications    acetaminophen  325 MG tablet Commonly known as: TYLENOL  Take 2 tablets (650 mg total) by mouth every 6 (six) hours as needed for mild pain (pain score 1-3) or fever (or Fever >/= 101).   apixaban  5 MG Tabs tablet Commonly known as: ELIQUIS  Take 1 tablet (5 mg total) by mouth 2 (two) times daily. Please use 30 day free card.   budesonide  0.25 MG/2ML nebulizer solution Commonly known as: PULMICORT  Take 2 mLs (0.25 mg total) by nebulization 2 (two) times daily. What changed:  when to take this reasons to take this   docusate sodium  100 MG capsule Commonly known as: COLACE Take 100 mg by mouth daily as needed for mild constipation.   furosemide  40 MG tablet Commonly known as: LASIX  Take 1 tablet (40 mg total) by mouth daily.   ipratropium-albuterol  0.5-2.5 (3) MG/3ML Soln Commonly known as: DUONEB Take 3 mLs by nebulization 2 (two) times daily.   metoprolol  succinate 25 MG 24 hr tablet Commonly known as: TOPROL -XL Take 1 tablet (25 mg total) by mouth daily. What changed: when to take this   montelukast  10 MG tablet Commonly known as: SINGULAIR  Take 1 tablet (10 mg total) by mouth at bedtime as needed (allergies).   multivitamin with minerals Tabs tablet Take 1 tablet by mouth daily.   omeprazole  20 MG tablet Commonly known as: PRILOSEC  OTC Take 20 mg by mouth daily.   pantoprazole  40 MG tablet Commonly known as: PROTONIX  Take 1 tablet (40 mg total) by mouth daily.   polyethylene glycol 17 g packet Commonly known as: MIRALAX  / GLYCOLAX  Take 17 g by mouth daily as needed for mild constipation.   potassium chloride  SA 20 MEQ tablet Commonly known as: KLOR-CON  M Take 1 tablet (20 mEq total) by mouth daily.   PROBIOTIC PO  Take 1 tablet by mouth daily.   ramelteon  8 MG  tablet Commonly known as: ROZEREM  Take 1 tablet (8 mg total) by mouth at bedtime.   rosuvastatin  20 MG tablet Commonly known as: CRESTOR  Take 20 mg by mouth daily.          Outstanding Labs/Studies   BMET  ______________________  Duration of Discharge Encounter: APP Time: 15 minutes    Signed, Lamarr Hummer, PA-C 03/02/2024, 12:37 PM [phone]  Jacob Montes was seen by me today along with Izetta Hummer, PA-C. I have personally performed an evaluation on this patient.  My findings are as follows:  84 y.o. 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 He has had admission with CHF complicated by delirium.  Now s/p valve in valve TAVR. He did have altered mental status the night of his TAVR but this has resolved.  Now back to baseline mental status He is post thoracentesis (850 cc fluid removed) and diuresis with IV Lasix  (negative 4.7 liters).  No complaints today.  Groins stable.   Data: EKG(s) and pertinent labs, studies, etc were personally reviewed and interpreted by me:  EKG reviewed by me: Sinus  LBBB Telemetry reviewed by me: sinus All labs reviewed by me Otherwise, I agree with data as outlined by the advanced practice provider.  Exam performed by me: Gen: NAD Neck: No JVD Cardiac: RRR without murmur Lungs: clear bilaterally Extremities: No LE edema  My Assessment and Plan:  Failure of the bioprosthetic AVR: now s/p valve in valve TAVR. Doing well. Groins stable. Echo reviewed by me. Trivial PVL. LV function low normal. Mild MR.  Will discharge on Eliquis .   Dementia/Delirium: Back to baseline  Acute on chronic diastolic CHF: He has diuresed well and is post thoracentesis. Will discharge home on oral Lasix .   The patient will be discharged home today  I spent 20 minutes seeing the patient. During  that time, I reviewed the labs, EKG, telemetry, echo images, evaluated their symptoms and performed an examination. This time also included plan formulation, discussion of the plan with the patient and the time spent with documentation.   Signed,  Lonni Cash, MD, [facility] 03/03/2024 7:43 AM      [1]  Allergies Allergen Reactions   Atorvastatin Other (See Comments)    aches  myalgia  myalgia    aches  myalgia   Ramipril Other (See Comments) and Cough    cough   "

## 2024-02-29 NOTE — CV Procedure (Signed)
 HEART AND VASCULAR CENTER  TAVR OPERATIVE NOTE   Date of Procedure:  02/29/2024  Preoperative Diagnosis: Severe Aortic Stenosis   Postoperative Diagnosis: Same   Procedure:   Transcatheter Aortic Valve Replacement - Transfemoral Approach  Medtronic Evolut FX Plus THV (size 26 mm, model # EVFXPLUS-26, serial # [id number] )   Co-Surgeons:  Lonni Cash, MD and Dorise LOIS Fellers, MD   Anesthesiologist:  Hodierne  Echocardiographer:  Croitoru  Pre-operative Echo Findings: Severe aortic stenosis Normal left ventricular systolic function  Post-operative Echo Findings: Trivial paravalvular leak Normal left ventricular systolic function  BRIEF CLINICAL NOTE AND INDICATIONS FOR SURGERY  84 yo male with history of CAD s/p CABG, HTN, HLD, CVA, mild dementia, carotid artery disease, HFpEF, sleep apnea and severe aortic stenosis s/p TAVR in 2018 with failure of bioprosthetic AVR who is here today for valve in valve TAVR. He has had multiple recent admissions with heart failure felt to be due to his bioprosthetic AVR failure. He was most recently admitted to [facility] with CHF on 02/01/24 and was transferred to [facility] for management. Echo 02/01/24 with LVEF=50-55%. His bioprosthetic AVR had severe stenosis and moderate perivalvular leak. He was volume overloaded and was diuresed with IV Lasix . Cardiac cath with patent grafts. He developed an aspiration pneumonia and had hospital delirium. We considered inpatient TAVR but this was cancelled due to his delirium. His mental status improved prior to discharge. He was discharged to a SNF. He was seen in the office last week and his mental status was back to baseline. Based on the presence of an under expanded Sapien valve, the team has reviewed his scans and the plan is to place a Medtronic Evolut valve inside the old Sapien valve. He has femoral access for TAVR.     During the course of the patient's preoperative work up they have been evaluated  comprehensively by a multidisciplinary team of specialists coordinated through the Multidisciplinary Heart Valve Clinic in the [facility].  They have been demonstrated to suffer from symptomatic severe aortic stenosis as noted above. The patient has been counseled extensively as to the relative risks and benefits of all options for the treatment of severe aortic stenosis including long term medical therapy, conventional surgery for aortic valve replacement, and transcatheter aortic valve replacement.  The patient has been independently evaluated by Dr. Shyrl with CT surgery and they are felt to be at high risk for conventional surgical aortic valve replacement. The surgeon indicated the patient would be a poor candidate for conventional surgery. Based upon review of all of the patient's preoperative diagnostic tests they are felt to be candidate for transcatheter aortic valve replacement using the transfemoral approach as an alternative to high risk conventional surgery.    Following the decision to proceed with transcatheter aortic valve replacement, a discussion has been held regarding what types of management strategies would be attempted intraoperatively in the event of life-threatening complications, including whether or not the patient would be considered a candidate for the use of cardiopulmonary bypass and/or conversion to open sternotomy for attempted surgical intervention.  The patient has been advised of a variety of complications that might develop peculiar to this approach including but not limited to risks of death, stroke, paravalvular leak, aortic dissection or other major vascular complications, aortic annulus rupture, device embolization, cardiac rupture or perforation, acute myocardial infarction, arrhythmia, heart block or bradycardia requiring permanent pacemaker placement, congestive heart failure, respiratory failure, renal failure, pneumonia, infection, other  late  complications related to structural valve deterioration or migration, or other complications that might ultimately cause a temporary or permanent loss of functional independence or other long term morbidity.  The patient provides full informed consent for the procedure as described and all questions were answered preoperatively.    DETAILS OF THE OPERATIVE PROCEDURE  PREPARATION:   The patient is brought to the operating room on the above mentioned date and central monitoring was established by the anesthesia team including placement of a radial arterial line. The patient is placed in the supine position on the operating table.  Intravenous antibiotics are administered. Conscious sedation is used.   Baseline transthoracic echocardiogram was performed. The patient's chest, abdomen, both groins, and both lower extremities are prepared and draped in a sterile manner. A time out procedure is performed.   PERIPHERAL ACCESS:   Using the modified Seldinger technique, femoral arterial and venous access were obtained with placement of a 6 Fr sheath in the artery and a 7 Fr sheath in the vein on the left side using u/s guidance.  A pigtail diagnostic catheter was passed through the femoral arterial sheath under fluoroscopic guidance into the aortic root.  A temporary transvenous pacemaker catheter was passed through the femoral venous sheath under fluoroscopic guidance into the right ventricle.  The pacemaker was tested to ensure stable lead placement and pacemaker capture.   TRANSFEMORAL ACCESS:  A micropuncture kit was used to gain access to the right femoral artery using u/s guidance. Position confirmed with angiography. Pre-closure with double ProGlide closure devices. The patient was heparinized systemically and ACT verified > 250 seconds.    A 18 Fr transfemoral Dry Seal sheath was introduced into the right femoral artery over an Amplatz superstiff wire. An AL-2 catheter was used to direct a  straight-tip exchange length wire across the native aortic valve into the left ventricle. This was exchanged out for a pigtail catheter and position was confirmed in the LV apex. Simultaneous LV and Ao pressures were recorded.  The pigtail catheter was then exchanged for an Confida wire in the LV apex.   Balloon valvuloplasty is performed with a 22 mm True balloon while using rapid pacing.   TRANSCATHETER HEART VALVE DEPLOYMENT:  A Medtronic Evolut Pro THV size 26 mm was prepared and per manufacturer's guidelines, and the proper orientation of the valve is confirmed on the delivery system.  The valve was advanced through the introducer sheath into the descending aorta. The valve was then advanced across the aortic arch. The valve was carefully positioned across the aortic valve annulus. Once final position of the valve has been confirmed with angiographic assessment, the valve is deployed with controlled rapid pacing. The valve is taken to 80% deployment and appropriate depth is confirmed. The valve is then released from each paddle.  There is trivial valvular leak and no central aortic insufficiency.  The patient's hemodynamic recovery following valve deployment is good.  Echo demostrated acceptable post-procedural gradients, stable mitral valve function, and trivial AI.   PROCEDURE COMPLETION:  The sheath was then removed and closure devices were completed. Protamine was administered once femoral arterial repair was complete. The temporary pacemaker, pigtail catheters and femoral sheaths were removed with a Mynx closure device placed in the artery and manual pressure used for venous hemostasis.    The patient tolerated the procedure well and is transported to the surgical intensive care in stable condition. There were no immediate intraoperative complications. All sponge instrument and needle counts are verified correct at  completion of the operation.   No blood products were administered during the  operation.  The patient received a total of 0 mL of intravenous contrast during the procedure.  LVEDP=26 mmHg  Lonni Cash MD 02/29/2024 3:12 PM

## 2024-02-29 NOTE — Anesthesia Preprocedure Evaluation (Signed)
"                                    Anesthesia Evaluation  Patient identified by MRN, date of birth, ID band Patient awake    Reviewed: Allergy & Precautions, H&P , NPO status , Patient's Chart, lab work & pertinent test results  Airway Mallampati: II   Neck ROM: full    Dental   Pulmonary sleep apnea , former smoker   breath sounds clear to auscultation       Cardiovascular hypertension, + CAD, + CABG and +CHF  + Valvular Problems/Murmurs AS  Rhythm:regular Rate:Normal  S/p TAVR 2018  Severe AS with mean PG 54 mmHg.  Moderate perivalvular leak.   Neuro/Psych CVA    GI/Hepatic ,GERD  ,,  Endo/Other    Renal/GU      Musculoskeletal  (+) Arthritis ,    Abdominal   Peds  Hematology   Anesthesia Other Findings   Reproductive/Obstetrics                              Anesthesia Physical Anesthesia Plan  ASA: 4  Anesthesia Plan: MAC   Post-op Pain Management:    Induction: Intravenous  PONV Risk Score and Plan: 1 and Treatment may vary due to age or medical condition  Airway Management Planned: Simple Face Mask  Additional Equipment:   Intra-op Plan:   Post-operative Plan:   Informed Consent: I have reviewed the patients History and Physical, chart, labs and discussed the procedure including the risks, benefits and alternatives for the proposed anesthesia with the patient or authorized representative who has indicated his/her understanding and acceptance.     Dental advisory given  Plan Discussed with: CRNA, Anesthesiologist and Surgeon  Anesthesia Plan Comments:         Anesthesia Quick Evaluation  "

## 2024-02-29 NOTE — Significant Event (Signed)
 I was notified by the PA at 6:29 PM that the patient had altered mental status.  I immediately presented to the patient's bedside and he was noted to be unresponsive to verbal commands and physical stimulation including sternal rub.  On exam he notably had bilateral pinpoint pupils with upgoing toes on Babinski.  A POC glucose was obtained and was WNL at 80.  I proceeded to give the patient 0.2 mg of Narcan  and a stroke code was called.  Neurology presented to the bedside and the patient started to withdraw to pain with movement primarily on his left side.  An additional 0.2 mg of Narcan  was administered.  An ABG was collected with results pending.  The patient was deemed not to be a tPA candidate given recent anticoagulation during his TAVR.  He is currently undergoing urgent CT scanning to rule out CVA.  My PA Callie will notify the patient's family members.  I notified Dr. Verlin regarding this event and he shared with me that the patient has bad dementia/delirium at baseline and that this may be a sequela of the anesthesia.  I will await the results of his CT imaging and neurology's recommendations for further guidance.  Traeger Sultana T. Floretta HEATH, MD [place]  [facility]  02/29/2024 7:03 PM

## 2024-02-29 NOTE — Discharge Instructions (Signed)

## 2024-02-29 NOTE — Consult Note (Signed)
 "  NAME:  Jacob Montes, MRN:  [id number], DOB:  02/18/1940, LOS: 0 ADMISSION DATE:  02/29/2024, CONSULTATION DATE:  02/29/24 REFERRING MD:  Verlin CHIEF COMPLAINT:  AMS   History of Present Illness:  Pt is encephelopathic; therefore, this HPI is obtained from chart review. Jacob Montes is a 84 y.o. male who has a PMH as outlined below including but not limited to rapidly progressive prosthetic aortic valve stenosis.  He was admitted to [facility] on 02/29/24 for TAVR.  He had to have a previous TAVR placed in 01/2017 in [place]  and he has apparently had 2 hospitalizations this month alone due to heart failure.  On 1/20, he was taken to the OR and had a valve in valve TAVR - percutaneous right transfemoral approach.  Following the procedure, he was transferred to 4 E progressive unit.  Upon arrival, he was found to be altered and minimally responsive to painful stimuli and sternal rub.  Cardiology was called and patient was given 0.2 mg of Narcan  x 2 with some slight improvement.  Code stroke was activated and patient was taken to CT where CT and CTA head were both negative.  An MRI has been ordered by neurology and is currently pending.  PCCM was called to see in consultation for further evaluation recommendations.  He had an ABG that showed very mildly compensated hypercarbic respiratory failure (7.3 7/55/192).  On my evaluation, he is somnolent and altered.  He does localize to noxious stimuli and tries to push me away.  He does have an intact cough and gag and actively clenches his teeth when I tried to suction his mouth.  Due to slight response to 0.2 mg of Narcan  x 2 earlier, I have asked nurse to administer additional 1 mg of Narcan  now.  Per Dr. Verlin, pt does have fairly advanced dementia and delirium at baseline and this could be a sequela of anesthesia which we agree with based on our exam.  Pertinent  Medical History:  has Arteriosclerotic coronary artery disease; Benign  essential hypertension; 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; Carotid stenosis, bilateral; Dyslipidemia; Generalized osteoarthritis; Hypogonadism, testicular; Nephrolithiasis; Sleep apnea; Vocal cord paralysis; Constipation; Ascending cholangitis (HCC); Common bile duct stone; RCT (rotator cuff tear); Hyperlipidemia; Tear of meniscus of left knee; History of CAD; Paroxysmal tachycardia (HCC); History of transcatheter aortic valve replacement (TAVR); Morbid obesity (HCC); Polyosteoarthritis, unspecified; Acute cholangitis with choledocholithiasis; Elevated liver enzymes; Choledocholithiasis; Liver abscess; Acute exacerbation of CHF (congestive heart failure) (HCC); Acute on chronic heart failure with preserved ejection fraction (HFpEF) (HCC); Prosthetic valve dysfunction; Aortic stenosis s/p TAVR; and 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  Significant Hospital Events: Including procedures, antibiotic start and stop dates in addition to other pertinent events   1/20 admit, valve in valve TAVR   Interim History / Subjective:  Somnolent and altered, but is protecting his airway and starting to localize to noxious stimuli.  Objective:  Blood pressure (!) 141/67, pulse (!) 59, temperature 97.7 F (36.5 C), temperature source Oral, resp. rate 17, height 5' 6 (1.676 m), weight 95.5 kg, SpO2 93%.        Intake/Output Summary (Last 24 hours) at 02/29/2024 2026 Last data filed at 02/29/2024 1706 Gross per 24 hour  Intake 500 ml  Output --  Net 500 ml   Filed Weights   02/29/24 1259  Weight: 95.5 kg     Physical Exam: General: Adult male, chronically ill appearing, in NAD. Neuro: Somnolent  and altered. Minimally responsive but is localizing to pain and does protect airway. HEENT: /AT. Sclerae anicteric. MMM. Cardiovascular: RRR, no M/R/G.  Lungs: Respirations even and unlabored.  CTA bilaterally, No W/R/R. Abdomen: BS x 4, soft, NT/ND.  Musculoskeletal: No gross  deformities, 2+ edema to knees.   Labs/imaging personally reviewed:  CT and CTA head 1/20 > neg. MRI brain 1/20 >   Assessment & Plan:   Acute metabolic encephalopathy -per history, he has at least moderate dementia and delirium at baseline and there is concern that his encephalopathy could be a sequela of anesthesia.  He had his ABG on the floor after his procedure that is not overly concerning and his labs from earlier today are also benign without anything else to explain his encephalopathy. R/o CVA - doubtful, favor sequela of anesthesia as above.  CT and CTA head negative, MRI pending. - Additional 1 mg Narcan  now. - Continue supportive care. - Avoid sedating medications (have modified his PTA Ramelteon  to start tomorrow 1/21). - MRI has been ordered by neurology.  Hx OSA - unclear if is on CPAP therapy. - It is not unreasonable to use BiPAP tonight while he is altered to avoid worsening hypercapnia. - Verify his CPAP usage once resolved and if uses one, would resume while inpatient.  Bilateral pleural effusions R > L. - Would ask IR for diagnostic and therapeutic thoracentesis 1/21, will place and order (likely transudative in setting CHF).  S/p valve in valve TAVR - with hx of severe aortic stenosis secondary to failure of bioprosthetic AVR (2018). Hx acute on chronic diastolic CHF-has not really responded to diuresis recently and has had 2 admissions this month for heart failure. Hx HLD, HTN, CAD. - Post procedure care per cardiology. - Diuresis and heart failure management per cardiology. - F/u on echo.  Hypokalemia. - Gentle fluids with LR. - 4 runs K. - Follow BMP.  Rest per primary team. PCCM will follow up once pleural fluid studies have returned though suspect recommendations would be to continue with aggressive diuresis as able if this is in fact transudative effusion in setting of CHF as suspected. Please call back if we can be of further assistance in the  interim.   Labs   CBC: Recent Labs  Lab 02/25/24 0813 02/29/24 1658  WBC 8.0  --   HGB 13.5 10.5*  HCT 40.1 31.0*  MCV 96.2  --   PLT 158  --     Basic Metabolic Panel: Recent Labs  Lab 02/25/24 0813 02/29/24 1658  NA 144 148*  K 4.0 3.3*  CL 103 100  CO2 33*  --   GLUCOSE 91 80  BUN 18 23  CREATININE 1.18 1.00  CALCIUM  9.8  --    GFR: Estimated Creatinine Clearance: 60.6 mL/min (by C-G formula based on SCr of 1 mg/dL). Recent Labs  Lab 02/25/24 0813  WBC 8.0    Liver Function Tests: Recent Labs  Lab 02/25/24 0813  AST 44*  ALT 40  ALKPHOS 77  BILITOT 1.3*  PROT 6.8  ALBUMIN 3.5   No results for input(s): LIPASE, AMYLASE in the last 168 hours. No results for input(s): AMMONIA in the last 168 hours.  ABG    Component Value Date/Time   PHART 7.37 02/29/2024 1849   PCO2ART 55 (H) 02/29/2024 1849   PO2ART 192 (H) 02/29/2024 1849   HCO3 31.8 (H) 02/29/2024 1849   TCO2 27 02/29/2024 1658   O2SAT 100 02/29/2024 1849  Coagulation Profile: Recent Labs  Lab 02/25/24 0813  INR 1.2    Cardiac Enzymes: No results for input(s): CKTOTAL, CKMB, CKMBINDEX, TROPONINI in the last 168 hours.  HbA1C: Hgb A1c MFr Bld  Date/Time Value Ref Range Status  10/07/2021 09:04 AM 5.9 (H) 4.8 - 5.6 % Final    Comment:             Prediabetes: 5.7 - 6.4          Diabetes: >6.4          Glycemic control for adults with diabetes: <7.0   12/03/2020 10:57 AM 5.6 4.8 - 5.6 % Final    Comment:             Prediabetes: 5.7 - 6.4          Diabetes: >6.4          Glycemic control for adults with diabetes: <7.0     CBG: No results for input(s): GLUCAP in the last 168 hours.  Review of Systems:   Unable to obtain as pt is encephalopathic.  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   Surgical History:   Past Surgical History:  Procedure Laterality Date   CARDIAC CATHETERIZATION     CARDIAC VALVE REPLACEMENT  01-19-17   Carotid surgery Left 03/08/2007   CHOLECYSTECTOMY  02/16/2018   COLONOSCOPY WITH PROPOFOL  N/A 09/01/2022   Procedure: COLONOSCOPY WITH PROPOFOL ;  Surgeon: Maryruth Ole DASEN, MD;  Location: [facility] ENDOSCOPY;  Service: Endoscopy;  Laterality: N/A;   CORONARY ARTERY BYPASS GRAFT     ENDOSCOPIC RETROGRADE CHOLANGIOPANCREATOGRAPHY (ERCP) WITH PROPOFOL  N/A 07/10/2021   Procedure: ENDOSCOPIC RETROGRADE CHOLANGIOPANCREATOGRAPHY (ERCP) WITH PROPOFOL ;  Surgeon: Jinny Carmine, MD;  Location: [facility] ENDOSCOPY;  Service: Endoscopy;  Laterality: N/A;   ERCP N/A 11/18/2018   Procedure: ENDOSCOPIC RETROGRADE CHOLANGIOPANCREATOGRAPHY (ERCP);  Surgeon: Jinny Carmine, MD;  Location: [facility] ENDOSCOPY;  Service: Endoscopy;  Laterality: N/A;   EUS N/A 08/21/2021   Procedure: UPPER ENDOSCOPIC ULTRASOUND (EUS) LINEAR;  Surgeon: Queenie Asberry LABOR, MD;  Location: [facility] ENDOSCOPY;  Service: Gastroenterology;  Laterality: N/A;  [facility]   EYE SURGERY  04-15-99   09-14-99   Heart Bypass N/A 07/2007   HERNIA REPAIR  03-12-88   Not sure of date   left shoulder surgery Left 06/03/2009   POLYPECTOMY  09/01/2022   Procedure: POLYPECTOMY;  Surgeon: Maryruth Ole DASEN, MD;  Location: [facility] ENDOSCOPY;  Service: Endoscopy;;   RIGHT AND LEFT HEART CATH  07/18/2007   right shoulder surgery  2003   RIGHT/LEFT  HEART CATH AND CORONARY/GRAFT ANGIOGRAPHY N/A 02/04/2024   Procedure: RIGHT/LEFT HEART CATH AND CORONARY/GRAFT ANGIOGRAPHY;  Surgeon: Darron Deatrice LABOR, MD;  Location: [facility] INVASIVE CV LAB;  Service: Cardiovascular;  Laterality: N/A;   TEE WITHOUT CARDIOVERSION N/A 11/21/2018   Procedure: TRANSESOPHAGEAL ECHOCARDIOGRAM (TEE);  Surgeon: Darron Deatrice LABOR, MD;  Location: [facility] ORS;  Service: Cardiovascular;  Laterality: N/A;   TVAR       Social History:   reports that he quit smoking about  47 years ago. His smoking use included cigarettes. He has never used smokeless tobacco. He reports that he does not drink alcohol and does not use drugs.   Family History:  His family history includes Alcohol abuse in his father; Hypertension in his mother; Lung cancer in his father; Non-Hodgkin's lymphoma in his brother; Stroke in his mother; Varicose Veins in his brother.   Allergies Allergies[1]   Home Medications  Prior to Admission medications  Medication Sig Start Date End Date Taking? Authorizing Provider  acetaminophen  (TYLENOL ) 325 MG tablet Take 2 tablets (650 mg total) by mouth every 6 (six) hours as needed for mild pain (pain score 1-3) or fever (or Fever >/= 101). 02/06/24  Yes Alexander, Natalie, DO  budesonide  (PULMICORT ) 0.25 MG/2ML nebulizer solution Take 2 mLs (0.25 mg total) by nebulization 2 (two) times daily. Patient taking differently: Take 0.25 mg by nebulization 2 (two) times daily as needed (for shortness of breath). 02/06/24  Yes Alexander, Natalie, DO  docusate sodium  (COLACE) 100 MG capsule Take 100 mg by mouth daily as needed for mild constipation.   Yes [provider]  furosemide  (LASIX ) 40 MG tablet Take 1 tablet (40 mg total) by mouth daily. 02/18/24  Yes Vann, Jessica U, DO  ipratropium-albuterol  (DUONEB) 0.5-2.5 (3) MG/3ML SOLN Take 3 mLs by nebulization 2 (two) times daily. 02/06/24  Yes Alexander, Natalie, DO  metoprolol  succinate (TOPROL -XL) 25 MG 24 hr tablet Take  1 tablet (25 mg total) by mouth daily. Patient taking differently: Take 25 mg by mouth every evening. 02/06/24  Yes Alexander, Natalie, DO  montelukast  (SINGULAIR ) 10 MG tablet Take 1 tablet (10 mg total) by mouth at bedtime as needed (allergies). 05/22/19  Yes [name], MD  omeprazole  (PRILOSEC  OTC) 20 MG tablet Take 20 mg by mouth daily.   Yes [provider]  polyethylene glycol (MIRALAX  / GLYCOLAX ) 17 g packet Take 17 g by mouth daily as needed for mild constipation. 02/06/24  Yes Alexander, Natalie, DO  potassium chloride  SA (KLOR-CON  M) 20 MEQ tablet Take 1 tablet (20 mEq total) by mouth daily. 02/17/24  Yes Vann, Jessica U, DO  Probiotic Product (PROBIOTIC PO) Take 1 tablet by mouth daily.   Yes [provider]  ramelteon  (ROZEREM ) 8 MG tablet Take 1 tablet (8 mg total) by mouth at bedtime. 02/17/24  Yes Vann, Jessica U, DO  rosuvastatin  (CRESTOR ) 20 MG tablet Take 20 mg by mouth daily.   Yes [provider]  aspirin  81 MG chewable tablet Chew 81 mg by mouth daily.    [provider]  Multiple Vitamin (MULTIVITAMIN WITH MINERALS) TABS tablet Take 1 tablet by mouth daily. 02/07/24   Alexander, Natalie, DO  pantoprazole  (PROTONIX ) 40 MG tablet Take 1 tablet (40 mg total) by mouth daily. Patient not taking: Reported on 02/25/2024 02/17/24   Vann, Jessica U, DO      Sammi Gore, [place] [facility] For pager details, please see AMION or use Epic chat  After [phone], please call [facility] for cross coverage needs 02/29/2024, 8:26 PM       [1]  Allergies Allergen Reactions   Atorvastatin Other (See Comments)    aches  myalgia  myalgia    aches  myalgia   Ramipril Other (See Comments) and Cough    cough   "

## 2024-02-29 NOTE — Code Documentation (Signed)
 Stroke Response Nurse Documentation Code Documentation  SADARIUS NORMAN is a 84 y.o. male inpatient on 4 E with past medical hx of Aortic stenosis. On heparin  IV intraoperatively during TAVR Code stroke was activated by 4 E RN.   Pt was last know well at unclear time before his procedure. Symptom recognition was 1811.   Stroke team at the bedside on patient activation. Patient to CT with team. NIHSS 21, see documentation for details and code stroke times. Patient with decreased LOC, disoriented, not following commands, right arm weakness, bilateral leg weakness, Global aphasia , and dysarthria  on exam. The following imaging was completed:  CT Head, CTA, and CTP. Patient is not a candidate for IV Thrombolytic due to IV Heparin . Patient is not a candidate for IR due to no LVO on advanced imaging..   Care Plan:    No acute treatment/TIA alert: q2h x 12 hours NIHSS & VS, then q4h  NPO until passes Stroke swallow screen     Bedside handoff with 4 E  RN Erin.    Karrington Studnicka Livengood  Stroke Response RN

## 2024-02-29 NOTE — Progress Notes (Signed)
" °  Echocardiogram 2D Echocardiogram has been performed.  Jacob Montes 02/29/2024, 5:14 PM "

## 2024-02-29 NOTE — Consult Note (Signed)
 NEUROLOGY CONSULT NOTE   Date of service: February 29, 2024 Patient Name: Jacob Montes MRN:  [id number] DOB:  09/13/1940 Chief Complaint: code stroke - pinpoint pupils, unresponsive Requesting Provider: [name],*  History of Present Illness  Jacob Montes is a 84 y.o. male with hx of AS s/p TAVR today and other comorbidities as documented below in the past medical history section-who has underwent TAVR today and was brought into the floor extremely drowsy minimally responsive and eventually was even more difficult to arouse, and that is when code stroke was called.  The floor staff is unclear as to when the last known well is since he arrived extremely drowsy and was not able to provide any history at that time.  The primary team cardiologist was at bedside but he was not the one was taking care of the patient in the lab or during the procedure.   LKW: Unclear Modified rankin score: Unclear IV Thrombolysis: Unclear last known well, heparinization during the procedure EVT: NO ELVO NIHSS 24   ROS  Comprehensive ROS performed and pertinent positives documented in HPI    Past History   Past Medical History:  Diagnosis Date   Acute exacerbation of CHF (congestive heart failure) (HCC) 01/19/2024   Aortic stenosis s/p TAVR    a. 01/2017 s/p TAVR - 23mm Sapien; b. 11/2023 Echo: EF 50-55%, GrII DD, nl RV fxn, mild-mod MR, mild Ca2+ MS, Mod AS, AVA 0.84, mean gradient 32 mmHg, mild perivalvular leak; c. 01/2024 Echo: EF 50-55%, no rwma, GrII DD, nl RV fxn, mod dil LA, mildly dil RA, mild MR/MS, sev AS w/ mod perivalvular leak - mean grad , Vmax 4.52 m/s, DVI 0.13.   CAD (coronary artery disease)    a. 07/2007 s/p CABG x 3 [facility] [place], ); b. 12/2023 MV: No isch/infarct; c. 01/2024 Cath: LM 95ost/m, LAD 171m, LCX 100ost/p/m, OM3 40, RCA small, 95p, 84m, RPDA fills via collats from LAD. VG->OM1 nl, VG->OM3 nl, LIMA->LAD nl.   Cancer (HCC)    Skin   Carotid artery disease     a. 2009 s/p left sided CEA complicated by RLN parlaysis; b. 02/2023 U/S: no significant dzs.   Cataract    Chronic heart failure with preserved ejection fraction (HFpEF) (HCC)    a. 01/2024 Echo: EF 50-55%, GrII DD, nl RV fxn, mod dil LA, mild MR/MS, severe AS, mod perivalvular leak; b. 01/2024 RHC: RA 3, RV 66/2, PA 55/17 (30), PCWP 20.   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 Verlin and Dr. Lucas   Skin cancer    Sleep apnea    Stroke [facility])     Past Surgical History:  Procedure Laterality Date   CARDIAC CATHETERIZATION     CARDIAC VALVE REPLACEMENT  01-19-17   Carotid surgery Left 03/08/2007   CHOLECYSTECTOMY  02/16/2018   COLONOSCOPY WITH PROPOFOL  N/A 09/01/2022   Procedure: COLONOSCOPY WITH PROPOFOL ;  Surgeon: Maryruth Ole DASEN, MD;  Location: [facility] ENDOSCOPY;  Service: Endoscopy;  Laterality: N/A;   CORONARY ARTERY  BYPASS GRAFT     ENDOSCOPIC RETROGRADE CHOLANGIOPANCREATOGRAPHY (ERCP) WITH PROPOFOL  N/A 07/10/2021   Procedure: ENDOSCOPIC RETROGRADE CHOLANGIOPANCREATOGRAPHY (ERCP) WITH PROPOFOL ;  Surgeon: Jinny Carmine, MD;  Location: [facility] ENDOSCOPY;  Service: Endoscopy;  Laterality: N/A;   ERCP N/A 11/18/2018   Procedure: ENDOSCOPIC RETROGRADE CHOLANGIOPANCREATOGRAPHY (ERCP);  Surgeon: Jinny Carmine, MD;  Location[facility] ENDOSCOPY;  Service: Endoscopy;  Laterality: N/A;   EUS N/A 08/21/2021   Procedure: UPPER ENDOSCOPIC ULTRASOUND (EUS) LINEAR;  Surgeon: Queenie Asberry LABOR, MD;  Location: [facility] ENDOSCOPY;  Service: Gastroenterology;  Laterality:  N/A;  [facility]   EYE SURGERY  04-15-99   09-14-99   Heart Bypass N/A 07/2007   HERNIA REPAIR  03-12-88   Not sure of date   left shoulder surgery Left 06/03/2009   POLYPECTOMY  09/01/2022   Procedure: POLYPECTOMY;  Surgeon: Maryruth Ole DASEN, MD;  Location: [facility] ENDOSCOPY;  Service: Endoscopy;;   RIGHT AND LEFT HEART CATH  07/18/2007   right shoulder surgery  2003   RIGHT/LEFT HEART CATH AND CORONARY/GRAFT ANGIOGRAPHY N/A 02/04/2024   Procedure: RIGHT/LEFT HEART CATH AND CORONARY/GRAFT ANGIOGRAPHY;  Surgeon: Darron Deatrice LABOR, MD;  Location: [facility] INVASIVE CV LAB;  Service: Cardiovascular;  Laterality: N/A;   TEE WITHOUT CARDIOVERSION N/A 11/21/2018   Procedure: TRANSESOPHAGEAL ECHOCARDIOGRAM (TEE);  Surgeon: Darron Deatrice LABOR, MD;  Location: [facility] ORS;  Service: Cardiovascular;  Laterality: N/A;   TVAR      Family History: Family History  Problem Relation Age of Onset   Hypertension Mother    Stroke Mother    Lung cancer Father    Alcohol abuse Father    Non-Hodgkin's lymphoma Brother    Varicose Veins Brother     Social History  reports that he quit smoking about 47 years ago. His smoking use included cigarettes. He has never used smokeless tobacco. He reports that he does not drink alcohol and does not use drugs.  Allergies[1]  Medications  Current Medications[2]  Vitals   Vitals:   02/29/24 1740 02/29/24 1745 02/29/24 1807 02/29/24 1816  BP: (!) 126/45 (!) 126/49 (!) 125/42 (!) 125/45  Pulse: (!) 56 (!) 52 (!) 49 (!) 53  Resp: 14 15 17 15   Temp:   97.7 F (36.5 C)   TempSrc:      SpO2: 97% 97% 98% 97%  Weight:      Height:        Body mass index is 33.98 kg/m.   Physical Exam   GEN neuro: Obtunded, no sedation HEENT: Normocephalic atraumatic Lungs: Breathing well and saturating at 96% on supplemental oxygen CVS: Regular rate rhythm Groin sites soft Neurological exam Obtunded No sedation on board To noxious stimulation moans and localizes well with the  left upper extremity.  Dr. Stimulation to lower extremity, was able to withdraw the left lower extremity much stronger than the right. Pupils were 2 mm and sluggishly reactive to light-had received Narcan  prior to this exam. Does not follow commands Did not exhibit spontaneous movements  Labs/Imaging/Neurodiagnostic studies   CBC:  Recent Labs  Lab 02-29-24 0813 02/29/24 1658  WBC 8.0  --   HGB 13.5 10.5*  HCT 40.1 31.0*  MCV 96.2  --   PLT 158  --    Basic Metabolic Panel:  Lab Results  Component Value Date   NA 148 (H) 02/29/2024   K 3.3 (L) 02/29/2024   CO2 33 (H) 02/29/2024   GLUCOSE 80 02/29/2024   BUN 23 02/29/2024   CREATININE 1.00 02/29/2024   CALCIUM   9.8 02/25/2024   GFRNONAA >60 02/25/2024   GFRAA 108 09/27/2019   Lipid Panel:  Lab Results  Component Value Date   LDLCALC 67 10/07/2021   HgbA1c:  Lab Results  Component Value Date   HGBA1C 5.9 (H) 10/07/2021   INR  Lab Results  Component Value Date   INR 1.2 02/25/2024   APTT  Lab Results  Component Value Date   APTT 30 11/17/2018    CT Head without contrast(Personally reviewed): Hypodensity in the left frontal lobe-MCA territory.  Age-indeterminate.  CT angio Head and Neck with contrast(Personally reviewed): No ELVO. Perfusion study with no perfusion deficit     ASSESSMENT   LADERIUS VALBUENA is a 84 y.o. male, who recently underwent TAVR-past medical history as above, was brought in after TAVR to the floor extremely drowsy and kept becoming gradually obtunded with minimal response to noxious stimulation.  He was given 0.2 mg Narcan  with some improvement-was moaning to noxious simulation.  With repeat Narcan , he was able to have some localization to noxious stimulation with left upper extremity and some withdrawal in the right lower extremity. He still remained extremely obtunded and nonverbal. Noncontrast head CT showed a age-indeterminate hypodensity in the left frontal lobe/MCA  territory. There was no evidence of large vessel occlusion or perfusion deficit on CT angiography and perfusion studies of the head. His last known well is unclear, he has undergone TAVR-he also received heparinization during the procedure-these preclude him from [facility].  No ELVO. I suspect toxic metabolic encephalopathy in the setting of medication use or underlying hypercarbia   RECOMMENDATIONS  Consider CCM consult Arterial blood gas shows some hypercapnia Correction of hypercapnia per primary team Avoid sedating medications MRI of the brain without contrast when able to Check CBC, BMP Check chest x-ray, urinalysis I have relayed my plan to the covering cardiologist Dr. Lavona  Neurology will follow. ______________________________________________________________________    Bonney Eligio Lav, MD [facility]     [1]  Allergies Allergen Reactions   Atorvastatin Other (See Comments)    aches  myalgia  myalgia    aches  myalgia   Ramipril Other (See Comments) and Cough    cough  [2]  Current Facility-Administered Medications:    0.9 %  sodium chloride  infusion, , Intravenous, Continuous, Sebastian Lamarr JONELLE, PA-C   [START ON 03/01/2024] 0.9 %  sodium chloride  infusion, 250 mL, Intravenous, PRN, Sebastian Lamarr JONELLE, PA-C   0.9 %  sodium chloride  infusion, , Intravenous, Continuous, Jadine, Callie E, PA-C   acetaminophen  (TYLENOL ) tablet 650 mg, 650 mg, Oral, Q6H PRN **OR** acetaminophen  (TYLENOL ) suppository 650 mg, 650 mg, Rectal, Q6H PRN, Sebastian Lamarr JONELLE, PA-C   [START ON 03/01/2024] aspirin  chewable tablet 81 mg, 81 mg, Oral, Daily, Sebastian Lamarr JONELLE, PA-C   aspirin  tablet 325 mg, 325 mg, Oral, Daily, Chen, Lydia D, RPH   ceFAZolin  (ANCEF ) IVPB 2g/100 mL premix, 2 g, Intravenous, Q8H, Sebastian Lamarr JONELLE, PA-C   docusate sodium  (COLACE) capsule 100 mg, 100 mg, Oral, Daily PRN, Sebastian Lamarr JONELLE, PA-C   furosemide  (LASIX ) injection 40 mg, 40  mg, Intravenous, Q12H, [name], MD   ipratropium-albuterol  (DUONEB) 0.5-2.5 (3) MG/3ML nebulizer solution 3 mL, 3 mL, Nebulization, BID, Thompson, Kathryn R, PA-C   montelukast  (SINGULAIR ) tablet 10 mg, 10 mg, Oral, QHS PRN, Sebastian Lamarr JONELLE, PA-C   morphine  (PF) 2 MG/ML injection 1-4 mg, 1-4 mg, Intravenous, Q1H PRN, Sebastian Lamarr JONELLE, PA-C   naloxone  (NARCAN ) 0.4 MG/ML injection, , , ,  nitroGLYCERIN  50 mg in dextrose  5 % 250 mL (0.2 mg/mL) infusion, 0-100 mcg/min, Intravenous, Titrated, Sebastian Lamarr SAUNDERS, PA-C   ondansetron  (ZOFRAN ) injection 4 mg, 4 mg, Intravenous, Q6H PRN, Thompson, Kathryn R, PA-C   oxyCODONE  (Oxy IR/ROXICODONE ) immediate release tablet 5-10 mg, 5-10 mg, Oral, Q3H PRN, Thompson, Kathryn R, PA-C   pantoprazole  (PROTONIX ) EC tablet 40 mg, 40 mg, Oral, Daily, [name], MD   ramelteon  (ROZEREM ) tablet 8 mg, 8 mg, Oral, QHS, Thompson, Kathryn R, PA-C   rosuvastatin  (CRESTOR ) tablet 20 mg, 20 mg, Oral, Daily, Sebastian Lamarr SAUNDERS, PA-C   [START ON 03/01/2024] sodium chloride  flush (NS) 0.9 % injection 3 mL, 3 mL, Intravenous, Q12H, Sebastian Lamarr SAUNDERS, PA-C   [START ON 03/01/2024] sodium chloride  flush (NS) 0.9 % injection 3 mL, 3 mL, Intravenous, PRN, Sebastian Lamarr SAUNDERS, PA-C   traMADol  (ULTRAM ) tablet 50-100 mg, 50-100 mg, Oral, Q4H PRN, Sebastian Lamarr SAUNDERS, PA-C

## 2024-02-29 NOTE — Progress Notes (Signed)
 Site area: L Femoral  Site Prior to Removal:  Level 0 Pressure Applied For:15 min (venous sheath) Manual:   Yes Patient Status During Pull:  Stable Post Pull Site:  Level 0 Post Pull Instructions Given: Remain flat for 4 hours post op Post Pull Pulses Present: Yes Dressing Applied:  Gauze and tegaderm Bedrest begins @ 1740 Comments: Report given to [employer] on [address]

## 2024-02-29 NOTE — Progress Notes (Signed)
 Pt arrived from ..Cath..., Patient is arousable but drowsy.CCMD called. CHG bath given,no further needs at this time  , vitals taken

## 2024-02-29 NOTE — Transfer of Care (Signed)
 Immediate Anesthesia Transfer of Care Note  Patient: Jacob Montes  Procedure(s) Performed: Transcatheter Aortic Valve Replacement, Transfemoral ECHOCARDIOGRAM, TRANSTHORACIC  Patient Location: Cath Lab  Anesthesia Type:MAC  Level of Consciousness: drowsy  Airway & Oxygen Therapy: Patient Spontanous Breathing and Patient connected to face mask oxygen  Post-op Assessment: Report given to RN and Post -op Vital signs reviewed and stable  Post vital signs: Reviewed and stable  Last Vitals:  Vitals Value Taken Time  BP 119/49 02/29/24 17:30  Temp 98   Pulse 54 02/29/24 17:33  Resp 17 02/29/24 17:33  SpO2 98 % 02/29/24 17:33  Vitals shown include unfiled device data.  Last Pain:  Vitals:   02/29/24 1700  TempSrc:   PainSc: Asleep      Patients Stated Pain Goal: 0 (02/29/24 1312)  Complications: There were no known notable events for this encounter.

## 2024-02-29 NOTE — Progress Notes (Signed)
" °   02/29/24 1807  Assess: MEWS Score  Temp 97.7 F (36.5 C)  BP (!) 125/42  MAP (mmHg) 66  Pulse Rate (!) 49  ECG Heart Rate (!) 50  Resp 17  Level of Consciousness Responds to Pain  SpO2 98 %  O2 Device Simple Mask  Assess: MEWS Score  MEWS Temp 0  MEWS Systolic 0  MEWS Pulse 1  MEWS RR 0  MEWS LOC 2  MEWS Score 3  MEWS Score Color Yellow  Assess: if the MEWS score is Yellow or Red  Were vital signs accurate and taken at a resting state? Yes  Does the patient meet 2 or more of the SIRS criteria? No  MEWS guidelines implemented  Yes, yellow  Treat  MEWS Interventions Considered administering scheduled or prn medications/treatments as ordered  Take Vital Signs  Increase Vital Sign Frequency  Yellow: Q2hr x1, continue Q4hrs until patient remains green for 12hrs  Escalate  MEWS: Escalate Yellow: Discuss with charge nurse and consider notifying provider and/or RRT  Notify: Charge Nurse/RN  Name of Charge Nurse/RN Notified Lexicographer  Provider Notification  Provider Name/Title Aline Door PA  Date Provider Notified 02/29/24  Time Provider Notified 1811  Method of Notification Call;Page  Notification Reason Change in status  Provider response En route  Date of Provider Response 02/29/24  Time of Provider Response 1826  Notify: Rapid Response  Name of Rapid Response RN Notified Joyceann Faith  RN  Date Rapid Response Notified 02/29/24  Time Rapid Response Notified 1841  Assess: SIRS CRITERIA  SIRS Temperature  0  SIRS Respirations  0  SIRS Pulse 0  SIRS WBC 0  SIRS Score Sum  0    "

## 2024-03-01 ENCOUNTER — Inpatient Hospital Stay (HOSPITAL_COMMUNITY)

## 2024-03-01 ENCOUNTER — Other Ambulatory Visit (HOSPITAL_COMMUNITY): Payer: Self-pay

## 2024-03-01 ENCOUNTER — Telehealth (HOSPITAL_COMMUNITY): Payer: Self-pay | Admitting: Pharmacy Technician

## 2024-03-01 DIAGNOSIS — I5033 Acute on chronic diastolic (congestive) heart failure: Secondary | ICD-10-CM | POA: Diagnosis not present

## 2024-03-01 DIAGNOSIS — R410 Disorientation, unspecified: Secondary | ICD-10-CM

## 2024-03-01 DIAGNOSIS — G9341 Metabolic encephalopathy: Secondary | ICD-10-CM | POA: Diagnosis not present

## 2024-03-01 DIAGNOSIS — T41205A Adverse effect of unspecified general anesthetics, initial encounter: Secondary | ICD-10-CM

## 2024-03-01 DIAGNOSIS — I639 Cerebral infarction, unspecified: Secondary | ICD-10-CM

## 2024-03-01 DIAGNOSIS — D696 Thrombocytopenia, unspecified: Secondary | ICD-10-CM | POA: Diagnosis not present

## 2024-03-01 DIAGNOSIS — Z952 Presence of prosthetic heart valve: Secondary | ICD-10-CM

## 2024-03-01 DIAGNOSIS — T82857A Stenosis of cardiac prosthetic devices, implants and grafts, initial encounter: Secondary | ICD-10-CM | POA: Diagnosis not present

## 2024-03-01 HISTORY — PX: IR THORACENTESIS RIGHT ASP PLEURAL SPACE W/IMG GUIDE: IMG5380

## 2024-03-01 LAB — ECHOCARDIOGRAM COMPLETE
AR max vel: 2.22 cm2
AV Area VTI: 2.17 cm2
AV Area mean vel: 2.33 cm2
AV Mean grad: 6 mmHg
AV Peak grad: 10.8 mmHg
Ao pk vel: 1.64 m/s
Area-P 1/2: 2.7 cm2
Calc EF: 53.7 %
Height: 66 in
S' Lateral: 4.6 cm
Single Plane A2C EF: 57.5 %
Single Plane A4C EF: 46.2 %
Weight: 3269.86 [oz_av]

## 2024-03-01 LAB — CBC
HCT: 41.3 % (ref 39.0–52.0)
Hemoglobin: 13.9 g/dL (ref 13.0–17.0)
MCH: 32.8 pg (ref 26.0–34.0)
MCHC: 33.7 g/dL (ref 30.0–36.0)
MCV: 97.4 fL (ref 80.0–100.0)
Platelets: 116 K/uL — ABNORMAL LOW (ref 150–400)
RBC: 4.24 MIL/uL (ref 4.22–5.81)
RDW: 15.7 % — ABNORMAL HIGH (ref 11.5–15.5)
WBC: 6.4 K/uL (ref 4.0–10.5)
nRBC: 0 % (ref 0.0–0.2)

## 2024-03-01 LAB — BODY FLUID CELL COUNT WITH DIFFERENTIAL
Eos, Fluid: 1 %
Lymphs, Fluid: 82 %
Monocyte-Macrophage-Serous Fluid: 8 % — ABNORMAL LOW (ref 50–90)
Neutrophil Count, Fluid: 9 % (ref 0–25)
Total Nucleated Cell Count, Fluid: 610 uL (ref 0–1000)

## 2024-03-01 LAB — BASIC METABOLIC PANEL WITH GFR
Anion gap: 13 (ref 5–15)
BUN: 22 mg/dL (ref 8–23)
CO2: 27 mmol/L (ref 22–32)
Calcium: 9.5 mg/dL (ref 8.9–10.3)
Chloride: 105 mmol/L (ref 98–111)
Creatinine, Ser: 1.11 mg/dL (ref 0.61–1.24)
GFR, Estimated: >60 mL/min
Glucose, Bld: 74 mg/dL (ref 70–99)
Potassium: 4.1 mmol/L (ref 3.5–5.1)
Sodium: 145 mmol/L (ref 135–145)

## 2024-03-01 LAB — MAGNESIUM: Magnesium: 1.7 mg/dL (ref 1.7–2.4)

## 2024-03-01 LAB — AMMONIA: Ammonia: 27 umol/L (ref 9–35)

## 2024-03-01 LAB — LACTATE DEHYDROGENASE: LDH: 494 U/L — ABNORMAL HIGH (ref 105–235)

## 2024-03-01 LAB — PHOSPHORUS: Phosphorus: 3.1 mg/dL (ref 2.5–4.6)

## 2024-03-01 MED ORDER — APIXABAN 5 MG PO TABS
5.0000 mg | ORAL_TABLET | Freq: Two times a day (BID) | ORAL | Status: DC
  Administered 2024-03-01 – 2024-03-02 (×2): 5 mg via ORAL
  Filled 2024-03-01 (×2): qty 1

## 2024-03-01 MED ORDER — LIDOCAINE-EPINEPHRINE 1 %-1:100000 IJ SOLN
INTRAMUSCULAR | Status: AC
  Filled 2024-03-01: qty 20

## 2024-03-01 MED ORDER — LIDOCAINE-EPINEPHRINE 1 %-1:100000 IJ SOLN
20.0000 mL | Freq: Once | INTRAMUSCULAR | Status: AC
  Administered 2024-03-01: 10 mL via INTRADERMAL
  Filled 2024-03-01: qty 20

## 2024-03-01 MED ORDER — ASPIRIN 81 MG PO CHEW
81.0000 mg | CHEWABLE_TABLET | Freq: Every day | ORAL | Status: AC
  Administered 2024-03-01: 81 mg via ORAL
  Filled 2024-03-01: qty 1

## 2024-03-01 MED ORDER — POTASSIUM CHLORIDE CRYS ER 20 MEQ PO TBCR
20.0000 meq | EXTENDED_RELEASE_TABLET | Freq: Two times a day (BID) | ORAL | Status: DC
  Administered 2024-03-01 – 2024-03-02 (×3): 20 meq via ORAL
  Filled 2024-03-01 (×3): qty 1

## 2024-03-01 NOTE — Progress Notes (Signed)
 NEUROLOGY CONSULT FOLLOW UP NOTE   Date of service: March 01, 2024 Patient Name: Jacob Montes MRN:  [id number] DOB:  April 29, 1940  Interval Hx/subjective  Seen and examined.  Awake alert and back to baseline Vitals   Vitals:   03/01/24 0300 03/01/24 0511 03/01/24 1059 03/01/24 1200  BP: (!) 138/46  (!) 109/43 (!) 123/51  Pulse: (!) 58 (!) 52 71 65  Resp: 20 16 20 17   Temp: 98.2 F (36.8 C)   97.7 F (36.5 C)  TempSrc: Axillary   Oral  SpO2: 96% 94% 94% 95%  Weight:  92.7 kg    Height:         Body mass index is 32.99 kg/m.  Physical Exam   GEN: Well-developed well-nourished man in no acute distress HEENT: Normocephalic atraumatic Cardiovascular: Regular rhythm Respiratory: Breathing well saturating normally on room air Neurological exam He is awake alert oriented x 3, no evidence of dysarthria or aphasia.  Cranial nerves II to XII appear intact.  Motor examination reveals no deficits.  Sensory examination reveals no deficits.  Coordination examination reveals no deficits. Family reports him back to baseline.  Medications Current Medications[1]  Labs and Diagnostic Imaging   CBC:  Recent Labs  Lab 02/25/24 0813 02/29/24 1658 03/01/24 1001  WBC 8.0  --  6.4  HGB 13.5 10.5* 13.9  HCT 40.1 31.0* 41.3  MCV 96.2  --  97.4  PLT 158  --  116*    Basic Metabolic Panel:  Lab Results  Component Value Date   NA 145 03/01/2024   K 4.1 03/01/2024   CO2 27 03/01/2024   GLUCOSE 74 03/01/2024   BUN 22 03/01/2024   CREATININE 1.11 03/01/2024   CALCIUM  9.5 03/01/2024   GFRNONAA >60 03/01/2024   GFRAA 108 09/27/2019   Lipid Panel:  Lab Results  Component Value Date   LDLCALC 67 10/07/2021   HgbA1c:  Lab Results  Component Value Date   HGBA1C 5.9 (H) 10/07/2021   Urine Drug Screen: No results found for: LABOPIA, COCAINSCRNUR, LABBENZ, AMPHETMU, THCU, LABBARB  Alcohol Level No results found for: [facility] INR  Lab Results  Component Value Date    INR 1.2 02/25/2024   APTT  Lab Results  Component Value Date   APTT 30 11/17/2018   MRI brain without contrast-personally reviewed-punctate area of restricted diffusion in the left centrum semiovale-otherwise unremarkable for acute process.  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  Assessment   Jacob Montes is a 84 y.o. male status post TAVR yesterday, was brought back to the floors with extreme drowsiness and gradually became obtunded.  Treated with Narcan  with some improvement.  Code stroke called due to weakness in the right upper and left lower extremity on noxious stimulation. Concern for stroke versus toxic metabolic encephalopathy. Overnight his mentation improved and he is back to his baseline. Suspect hospital delirium versus prolonged effect of anesthesia. MRI reveals a punctate stroke-likely incidental. I would not hold anticoagulation if needed from a cardiac standpoint because the stroke  Recommendations  No further inpatient workup from neurological standpoint Management of medical comorbidities per primary team Okay with anticoagulation if required for cardiac reasons. Plan relayed to Dr. Verlin Please call back with questions as needed ______________________________________________________________________   Signed, Eligio Lav, MD [facility]     [1]  Current Facility-Administered Medications:    0.9 %  sodium chloride  infusion, 250 mL, Intravenous, PRN, Sebastian Lamarr JONELLE, PA-C   acetaminophen  (TYLENOL ) tablet 650 mg,  650 mg, Oral, Q6H PRN **OR** acetaminophen  (TYLENOL ) suppository 650 mg, 650 mg, Rectal, Q6H PRN, Sebastian Lamarr SAUNDERS, PA-C   apixaban  (ELIQUIS ) tablet 5 mg, 5 mg, Oral, BID, Gretel Prentice BIRCH, RPH   docusate sodium  (COLACE) capsule 100 mg, 100 mg, Oral, Daily PRN, Thompson, Kathryn R, PA-C   furosemide  (LASIX ) injection 40 mg, 40 mg, Intravenous, Q12H, [name], MD, 40 mg at 03/01/24 1059    ipratropium-albuterol  (DUONEB) 0.5-2.5 (3) MG/3ML nebulizer solution 3 mL, 3 mL, Nebulization, BID, Thompson, Kathryn R, PA-C, 3 mL at 02/29/24 2116   lactated ringers  infusion, , Intravenous, Continuous, Albustami, Mancel HERO, MD, Last Rate: 40 mL/hr at 03/01/24 1509, Infusion Verify at 03/01/24 1509   montelukast  (SINGULAIR ) tablet 10 mg, 10 mg, Oral, QHS PRN, Sebastian Lamarr SAUNDERS, PA-C   nitroGLYCERIN  50 mg in dextrose  5 % 250 mL (0.2 mg/mL) infusion, 0-100 mcg/min, Intravenous, Titrated, Sebastian Lamarr SAUNDERS, PA-C   ondansetron  (ZOFRAN ) injection 4 mg, 4 mg, Intravenous, Q6H PRN, Sebastian Lamarr SAUNDERS, PA-C   oxyCODONE  (Oxy IR/ROXICODONE ) immediate release tablet 5 mg, 5 mg, Oral, Q6H PRN, Albustami, Omar M, MD   pantoprazole  (PROTONIX ) EC tablet 40 mg, 40 mg, Oral, Daily, [name], MD, 40 mg at 03/01/24 1059   potassium chloride  SA (KLOR-CON  M) CR tablet 20 mEq, 20 mEq, Oral, BID, Sebastian Lamarr SAUNDERS, PA-C, 20 mEq at 03/01/24 1059   ramelteon  (ROZEREM ) tablet 8 mg, 8 mg, Oral, QHS, Desai, Rahul P, PA-C   rosuvastatin  (CRESTOR ) tablet 20 mg, 20 mg, Oral, Daily, Thompson, Kathryn R, PA-C, 20 mg at 03/01/24 1109   sodium chloride  flush (NS) 0.9 % injection 3 mL, 3 mL, Intravenous, Q12H, Sebastian Lamarr SAUNDERS, PA-C   sodium chloride  flush (NS) 0.9 % injection 3 mL, 3 mL, Intravenous, PRN, Sebastian Lamarr SAUNDERS, PA-C   traMADol  (ULTRAM ) tablet 50 mg, 50 mg, Oral, Q6H PRN, Albustami, Mancel HERO, MD

## 2024-03-01 NOTE — Plan of Care (Signed)
?  Problem: Clinical Measurements: ?Goal: Will remain free from infection ?Outcome: Progressing ?Goal: Diagnostic test results will improve ?Outcome: Progressing ?Goal: Respiratory complications will improve ?Outcome: Progressing ?  ?

## 2024-03-01 NOTE — Progress Notes (Signed)
" °  Echocardiogram 2D Echocardiogram has been performed.  Tinnie FORBES Gosling RDCS 03/01/2024, 1:38 PM "

## 2024-03-01 NOTE — Procedures (Signed)
 PROCEDURE SUMMARY:  Successful image-guided diagnostic and therapeutic thoracentesis from the right chest.  Yielded 850 mL of cloudy amber fluid.  No immediate complications.  EBL: zero Patient tolerated well.   Specimen sent for labs.  Post-procedure CXR ordered and obtained prior to departure from department.   Please see imaging section of Epic for full dictation.  Nadina Fomby B Kevan Prouty NP 03/01/2024 9:04 AM

## 2024-03-01 NOTE — Progress Notes (Signed)
 CARDIAC REHAB PHASE I   Post TAVR education provided to wife and patient at bedside. Education including site care, restrictions, risk factors, heart healthy diet, exercise guidelines, and CRP2 reviewed. At this time patient/wife not interested. All questions and concerns addressed.  [id number]  Jacob JAYSON [place], RN BSN 03/01/2024 10:23 AM

## 2024-03-01 NOTE — Anesthesia Postprocedure Evaluation (Signed)
"   Anesthesia Post Note  Patient: Jacob Montes  Procedure(s) Performed: Transcatheter Aortic Valve Replacement, Transfemoral ECHOCARDIOGRAM, TRANSTHORACIC     Patient location during evaluation: PACU Anesthesia Type: MAC Level of consciousness: awake and alert Pain management: pain level controlled Vital Signs Assessment: post-procedure vital signs reviewed and stable Respiratory status: spontaneous breathing, nonlabored ventilation, respiratory function stable and patient connected to nasal cannula oxygen Cardiovascular status: stable and blood pressure returned to baseline Postop Assessment: no apparent nausea or vomiting Anesthetic complications: no   There were no known notable events for this encounter.  Last Vitals:  Vitals:   03/01/24 0300 03/01/24 0511  BP: (!) 138/46   Pulse: (!) 58 (!) 52  Resp: 20 16  Temp: 36.8 C   SpO2: 96% 94%    Last Pain:  Vitals:   03/01/24 0311  TempSrc:   PainSc: Asleep                 [name]      "

## 2024-03-01 NOTE — Telephone Encounter (Signed)
 Patient Product/process Development Scientist completed.    The patient is insured through [facility]. Patient has Medicare and is not eligible for a copay card, but may be able to apply for patient assistance or Medicare RX Payment Plan (Patient Must reach out to their plan, if eligible for payment plan), if available.    Ran test claim for Eliquis  5 mg and the current 30 day co-pay is $258.70 due to deductible.  Ran test claim for Xarelto 20 mg and the current 30 day co-pay is $216.43 due to deductible.   This test claim was processed through [facility]- copay amounts may vary at other pharmacies due to pharmacy/plan contracts, or as the patient moves through the different stages of their insurance plan.     Reyes Sharps, CPHT Pharmacy Technician Patient Advocate Specialist Lead [facility] Patient Advocate Team Direct Number: [phone]  Fax: [phone]

## 2024-03-01 NOTE — Plan of Care (Signed)
  Problem: Clinical Measurements: Goal: Diagnostic test results will improve Outcome: Not Progressing   Problem: Activity: Goal: Risk for activity intolerance will decrease Outcome: Not Progressing   

## 2024-03-01 NOTE — Progress Notes (Signed)
 PHARMACY - ANTICOAGULATION CONSULT NOTE  Pharmacy Consult for apixaban  Indication: s/p valve procedure  Allergies[1]  Patient Measurements: Height: 5' 6 (167.6 cm) Weight: 92.7 kg (204 lb 5.9 oz) IBW/kg (Calculated) : 63.8 HEPARIN  DW (KG): 84.5  Vital Signs: Temp: 97.7 F (36.5 C) (01/21 1200) Temp Source: Oral (01/21 1200) BP: 123/51 (01/21 1200) Pulse Rate: 65 (01/21 1200)  Labs: Recent Labs    02/29/24 1658 03/01/24 1001  HGB 10.5* 13.9  HCT 31.0* 41.3  PLT  --  116*  CREATININE 1.00 1.11    Estimated Creatinine Clearance: 53.8 mL/min (by C-G formula based on SCr of 1.11 mg/dL).   Medical History: Past Medical History:  Diagnosis Date   Acute exacerbation of CHF (congestive heart failure) (HCC) 01/19/2024   Aortic stenosis s/p TAVR    a. 01/2017 s/p TAVR - 23mm Sapien; b. 11/2023 Echo: EF 50-55%, GrII DD, nl RV fxn, mild-mod MR, mild Ca2+ MS, Mod AS, AVA 0.84, mean gradient 32 mmHg, mild perivalvular leak; c. 01/2024 Echo: EF 50-55%, no rwma, GrII DD, nl RV fxn, mod dil LA, mildly dil RA, mild MR/MS, sev AS w/ mod perivalvular leak - mean grad , Vmax 4.52 m/s, DVI 0.13.   CAD (coronary artery disease)    a. 07/2007 s/p CABG x 3 [facility] [place]); b. 12/2023 MV: No isch/infarct; c. 01/2024 Cath: LM 95ost/m, LAD 131m, LCX 100ost/p/m, OM3 40, RCA small, 95p, 27m, RPDA fills via collats from LAD. VG->OM1 nl, VG->OM3 nl, LIMA->LAD nl.   Cancer (HCC)    Skin   Carotid artery disease    a. 2009 s/p left sided CEA complicated by RLN parlaysis; b. 02/2023 U/S: no significant dzs.   Cataract    Chronic heart failure with preserved ejection fraction (HFpEF) (HCC)    a. 01/2024 Echo: EF 50-55%, GrII DD, nl RV fxn, mod dil LA, mild MR/MS, severe AS, mod perivalvular leak; b. 01/2024 RHC: RA 3, RV 66/2, PA 55/17 (30), PCWP 20.   Delirium    during admission in 02/2024   Dementia 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 Verlin and Dr. Lucas   Skin cancer    Sleep apnea    Stroke [facility])     Medications:  Medications Prior to Admission  Medication Sig Dispense Refill Last Dose/Taking   acetaminophen  (TYLENOL ) 325 MG tablet Take 2 tablets (650 mg total) by mouth every 6 (six) hours as needed for mild pain (pain score 1-3) or fever (or Fever >/= 101).   Past Week   budesonide  (PULMICORT ) 0.25 MG/2ML nebulizer solution Take 2 mLs (0.25 mg total) by nebulization 2 (two) times daily. (Patient taking differently: Take 0.25 mg by nebulization 2 (two) times daily as needed (for shortness of breath).)   02/28/2024   docusate sodium  (COLACE) 100 MG capsule Take 100 mg by mouth daily as needed for mild constipation.   02/28/2024   furosemide  (LASIX ) 40 MG tablet Take 1 tablet (40 mg total) by mouth daily.   02/28/2024   ipratropium-albuterol  (DUONEB) 0.5-2.5 (3) MG/3ML SOLN Take 3 mLs by nebulization 2 (two) times daily.  02/28/2024   metoprolol  succinate (TOPROL -XL) 25 MG 24 hr tablet Take 1 tablet (25 mg total) by mouth daily. (Patient taking differently: Take 25 mg by mouth every evening.)   02/28/2024   montelukast  (SINGULAIR ) 10 MG tablet Take 1 tablet (10 mg total) by mouth at bedtime as needed (allergies). 30 tablet 3 Past Month   omeprazole  (PRILOSEC  OTC) 20 MG tablet Take 20 mg by mouth daily.   02/28/2024   polyethylene glycol (MIRALAX  / GLYCOLAX ) 17 g packet Take 17 g by mouth daily as needed for mild constipation.   Past Week   potassium chloride  SA (KLOR-CON  M) 20 MEQ tablet Take 1 tablet (20 mEq total) by mouth daily.   02/28/2024   Probiotic Product (PROBIOTIC PO) Take 1 tablet by mouth  daily.   Past Week   ramelteon  (ROZEREM ) 8 MG tablet Take 1 tablet (8 mg total) by mouth at bedtime.   02/28/2024   rosuvastatin  (CRESTOR ) 20 MG tablet Take 20 mg by mouth daily.   02/28/2024   aspirin  81 MG chewable tablet Chew 81 mg by mouth daily.   02/25/2024   Multiple Vitamin (MULTIVITAMIN WITH MINERALS) TABS tablet Take 1 tablet by mouth daily.   02/25/2024   pantoprazole  (PROTONIX ) 40 MG tablet Take 1 tablet (40 mg total) by mouth daily. (Patient not taking: Reported on 02/25/2024)       Assessment: 84 yo male s/p valve is valve TAVR. Plans are for 3 months apixaban  post procedure and pharmacy consulted to dose.  -SCr 1.1 -Wr 92.7kg -Initial cost is ~ $258 due to a deductible. The first month will be free.    Goal of Therapy:  Monitor platelets by anticoagulation protocol: Yes   Plan:  -Apixaban  5mg  po bid -Will provide patient education  Prentice Poisson, PharmD Clinical Pharmacist **Pharmacist phone directory can now be found on [email or web address] (PW [email or web address]).  Listed under [facility].       [1]  Allergies Allergen Reactions   Atorvastatin Other (See Comments)    aches  myalgia  myalgia    aches  myalgia   Ramipril Other (See Comments) and Cough    cough

## 2024-03-01 NOTE — Progress Notes (Addendum)
 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 Feeling fine. Wife at bedside.    Inpatient Medications    Scheduled Meds:  aspirin   81 mg Oral Daily   furosemide   40 mg Intravenous Q12H   ipratropium-albuterol   3 mL Nebulization BID   pantoprazole   40 mg Oral Daily   potassium chloride   20 mEq Oral BID   ramelteon   8 mg Oral QHS   rosuvastatin   20 mg Oral Daily   sodium chloride  flush  3 mL Intravenous Q12H   Continuous Infusions:  sodium chloride       ceFAZolin  (ANCEF ) IV 2 g (03/01/24 0016)   lactated ringers  40 mL/hr at 03/01/24 0004   nitroGLYCERIN      PRN Meds: sodium chloride , acetaminophen  **OR** acetaminophen , docusate sodium , montelukast , ondansetron  (ZOFRAN ) IV, oxyCODONE , sodium chloride  flush, traMADol    Vital Signs    Vitals:   02/29/24 2300 02/29/24 2315 03/01/24 0300 03/01/24 0511  BP: (!) 149/52  (!) 138/46   Pulse: 61 89 (!) 58 (!) 52  Resp: 15  20 16   Temp: (!) 97.5 F (36.4 C)  98.2 F (36.8 C)   TempSrc: Axillary  Axillary   SpO2: 100%  96% 94%  Weight:    92.7 kg  Height:         Intake/Output Summary (Last 24 hours) at 03/01/2024 1040 Last data filed at 03/01/2024 0629 Gross per 24 hour  Intake 965.04 ml  Output 2600 ml  Net -1634.96 ml   Filed Weights   02/29/24 1259 03/01/24 0511  Weight: 95.5 kg 92.7 kg    Physical Exam    GEN: Well nourished, well developed, in no acute distress.  HEENT: Grossly normal.  Neck: Supple, mildly elevated JVD. Cardiac: RRR, 2/6 Systolic murmur heard best at RUSB but can be heard throughout precordium. No rubs, or gallops. No clubbing, cyanosis, trace pretibial edema.   Respiratory:  Respirations regular and unlabored, clear to auscultation bilaterally. GI: Soft, nontender, nondistended, BS + x 4. MS: no deformity or atrophy. Skin: warm and dry, no rash.  Groin sites clear without hematoma or ecchymosis  Neuro:  Strength and sensation are intact. Psych: AAOx3.  Normal affect.  Labs    CBC Recent Labs    02/29/24 1658 03/01/24 1001  WBC  --  6.4  HGB 10.5* 13.9  HCT 31.0* 41.3  MCV  --  97.4  PLT  --  116*   Basic Metabolic Panel: Recent Labs  Lab 02/25/24 0813 02/29/24 1658  NA 144 148*  K 4.0 3.3*  CL 103 100  CO2 33*  --  GLUCOSE 91 80  BUN 18 23  CREATININE 1.18 1.00  CALCIUM  9.8  --    Creat Clearance Estimated Creatinine Clearance: 59.7 mL/min (by C-G formula based on SCr of 1 mg/dL).  Recent Labs  Lab 02/25/24 0813 03/01/24 1001  WBC 8.0 6.4    Telemetry    Sinus with HRs in 70s - Personally Reviewed  ECG    Sinus brady with LBBB and PACs, HR 48 - Personally Reviewed  Patient Profile     Jacob Montes is a 84 y.o. male with a history of CAD s/p CABG in 07/2007, HTN, HLD, CVA, carotid stenosis s/p LCEA in 2009 c/b R laryngeal nerve paralysis, OSA, remote PE (08/2010), severe AS s/p 23 mm S3 TAVR in 01/2017 (in [facility]), HFpEF with recurrent admissions, and progressive bioprosthetic TAVR valve stenosis who presented to [facility] on 02/29/24 for planned VIV TAVR in TAVR.    Assessment & Plan     Severe bioprosthetic AS:  -- S/p VIV TAVR in TAVR with a 26 mm Medtronic FX THV via the TF approach on 02/29/24.  -- Post operative pending.  -- Groin sites are stable.  -- Plan on Eliquis  5mg  BID x 3 months. Stop aspirin  81mg  daily after morning dose today. Consult pharmacy to find out about pricing.   New LBBB: -- ECG with sinus brady and new LBBB but no high grade heart block. -- Tele with narrow QRS and HR in 70s.  -- Will repeat ECG tomorrow AM.    Acute on chronic diastolic HF -- LVEDP 26 mm hg at the time of TAVR.  -- He was started on IV Lasix  40mg  BID and KCL 20meq BID.  -- Net negative 1.6L. -- Continue IV Lasix  for now and reassess tomorrow.    Bilateral pleural effusion: -- S/p R thoracentesis with 850 mL of cloudy amber fluid.    CAD s/p CABG '09 -- No chest pain -- [facility] 02/04/24 with significant underlying three-vessel coronary artery disease with patent grafts including LIMA to LAD, SVG to OM1 and SVG to OM 3. There was significant disease in the right coronary artery but the vessel is small and significant ostial left main stenosis supplying only a first diagonal branch. No targets for intervention.  -- Continue medical therapy with metoprolol  succinate 25 mg daily and Crestor  40 mg daily.  -- Stop aspirin  given start of Eliquis  x 3 months.    Carotid stenosis s/p LCEA in 2009 c/b R laryngeal nerve paralysis: -- Continue medical therapy.    Dementia with hospital delirium: -- Has a long history of waxing and waning mental status. Very difficult to rouse last night and code stroke called. MRI showed punctate acute white matter infarct in the left centrum semiovale and otherwise chronic infarcts w/  encephalomalacia and moderate small vessel disease.  -- Appreciate neuro assistance.  -- Now back to baseline neuro status.   Signed, Lamarr Hummer, PA-C  03/01/2024, 10:40 AM  Pager [phone]  I have personally seen and examined this patient. I agree with the  assessment and plan as outlined above.  He is doing much better today. Alert and oriented x 3.  Groins stable.  Tele: sinus EKG: sinus CT head and brain MRI reviewed. Small punctate infarcts in the left centrum semiovale and otherwise chronic disease.  Labs reviewed by me My exam: NAD RRR Lungs clear Ext: 1+ bilateral LE disease  Plan: POD 1 following TAVR with valve in valve of old Sapien valve.  Mental status  back to baseline.  Will start Eliquis  for 3 months following valve in valve procedure.   Lonni Cash, MD, [facility] 03/01/2024 2:26 PM  "

## 2024-03-01 NOTE — Progress Notes (Signed)
Heart Failure Navigator Progress Note  Assessed for Heart & Vascular TOC clinic readiness.  Patient does not meet criteria due to history of dementia.   Navigator available for reassessment of patient.   Kerby Nora, PharmD, BCPS Heart Failure Stewardship Pharmacist Phone [phone]

## 2024-03-02 ENCOUNTER — Other Ambulatory Visit (HOSPITAL_COMMUNITY): Payer: Self-pay

## 2024-03-02 ENCOUNTER — Inpatient Hospital Stay (HOSPITAL_COMMUNITY)
Admission: RE | Admit: 2024-03-02 | Discharge: 2024-03-02 | Disposition: A | Discharge status: Home / Self Care | Source: Home / Self Care | Attending: Physician Assistant | Admitting: Physician Assistant

## 2024-03-02 ENCOUNTER — Ambulatory Visit

## 2024-03-02 DIAGNOSIS — Z952 Presence of prosthetic heart valve: Secondary | ICD-10-CM

## 2024-03-02 DIAGNOSIS — I447 Left bundle-branch block, unspecified: Secondary | ICD-10-CM

## 2024-03-02 DIAGNOSIS — G9341 Metabolic encephalopathy: Secondary | ICD-10-CM | POA: Diagnosis not present

## 2024-03-02 DIAGNOSIS — I5033 Acute on chronic diastolic (congestive) heart failure: Secondary | ICD-10-CM | POA: Diagnosis not present

## 2024-03-02 DIAGNOSIS — T82857A Stenosis of cardiac prosthetic devices, implants and grafts, initial encounter: Secondary | ICD-10-CM | POA: Diagnosis not present

## 2024-03-02 DIAGNOSIS — D696 Thrombocytopenia, unspecified: Secondary | ICD-10-CM | POA: Diagnosis not present

## 2024-03-02 LAB — BASIC METABOLIC PANEL WITH GFR
Anion gap: 11 (ref 5–15)
BUN: 19 mg/dL (ref 8–23)
CO2: 30 mmol/L (ref 22–32)
Calcium: 9.2 mg/dL (ref 8.9–10.3)
Chloride: 101 mmol/L (ref 98–111)
Creatinine, Ser: 1.04 mg/dL (ref 0.61–1.24)
GFR, Estimated: >60 mL/min
Glucose, Bld: 83 mg/dL (ref 70–99)
Potassium: 3.3 mmol/L — ABNORMAL LOW (ref 3.5–5.1)
Sodium: 141 mmol/L (ref 135–145)

## 2024-03-02 LAB — CBC
HCT: 38.1 % — ABNORMAL LOW (ref 39.0–52.0)
Hemoglobin: 13.2 g/dL (ref 13.0–17.0)
MCH: 33 pg (ref 26.0–34.0)
MCHC: 34.6 g/dL (ref 30.0–36.0)
MCV: 95.3 fL (ref 80.0–100.0)
Platelets: 108 K/uL — ABNORMAL LOW (ref 150–400)
RBC: 4 MIL/uL — ABNORMAL LOW (ref 4.22–5.81)
RDW: 15.4 % (ref 11.5–15.5)
WBC: 7 K/uL (ref 4.0–10.5)
nRBC: 0 % (ref 0.0–0.2)

## 2024-03-02 LAB — PROTEIN, BODY FLUID (OTHER): Total Protein, Body Fluid Other: 1.8 g/dL

## 2024-03-02 LAB — LD, BODY FLUID (OTHER): LD, Body Fluid: 125 IU/L

## 2024-03-02 MED ORDER — IPRATROPIUM-ALBUTEROL 0.5-2.5 (3) MG/3ML IN SOLN: 3.0000 mL | RESPIRATORY_TRACT | Status: DC | PRN

## 2024-03-02 MED ORDER — APIXABAN 5 MG PO TABS
5.0000 mg | ORAL_TABLET | Freq: Two times a day (BID) | ORAL | 0 refills | Status: DC
  Filled 2024-03-02: qty 60, 30d supply, fill #0

## 2024-03-02 MED ORDER — POTASSIUM CHLORIDE CRYS ER 20 MEQ PO TBCR
40.0000 meq | EXTENDED_RELEASE_TABLET | Freq: Once | ORAL | Status: AC
  Administered 2024-03-02: 40 meq via ORAL
  Filled 2024-03-02: qty 2

## 2024-03-02 NOTE — Progress Notes (Signed)
 Mobility Specialist Progress Note;   03/02/24 1024  Mobility  Activity Ambulated with assistance  Level of Assistance Standby assist, set-up cues, supervision of patient - no hands on  Assistive Device None  Distance Ambulated (ft) 450 ft  Activity Response Tolerated well  Mobility Referral Yes  Mobility visit 1 Mobility  Mobility Specialist Start Time (ACUTE ONLY) 1024  Mobility Specialist Stop Time (ACUTE ONLY) 1044  Mobility Specialist Time Calculation (min) (ACUTE ONLY) 20 min   Pt pleasant and agreeable to mobility. Required no physical assistance during ambulation, SV for safety. HR up to 109 bpm w/ exertion. No c/o when asked. Pt returned to sitting on EoB and left with all needs met. Family present. RN notified.   Lauraine Erm Mobility Specialist Please contact via SecureChat or [employer] [phone]

## 2024-03-02 NOTE — Progress Notes (Signed)
 ZIO AT applied at hospital Dr. T. Gollan to read

## 2024-03-02 NOTE — TOC Transition Note (Signed)
 Transition of Care (TOC) - Discharge Note Rayfield Gobble RN, BSN Inpatient Care Management Unit 4E- RN Case Manager See Treatment Team for direct phone #   Patient Details  Name: Jacob Montes MRN: [id number] Date of Birth: Jul 10, 1940  Transition of Care [facility]) CM/SW Contact:  Gobble Rayfield Hurst, RN Phone Number: 03/02/2024, 11:36 AM   Clinical Narrative:    Pt from home w/ spouse, has home 02 at baseline w/ [employer].   Admitted for TAVR, no stable for transition home. No HH or DME needs noted on discharge. Family will transport home.      Final next level of care: Home/Self Care Barriers to Discharge: No Barriers Identified   Patient Goals and CMS Choice Patient states their goals for this hospitalization and ongoing recovery are:: plan to return home with spouse   Choice offered to / list presented to : NA      Discharge Placement             Home          Discharge Plan and Services Additional resources added to the After Visit Summary for   In-house Referral: NA Discharge Planning Services: NA Post Acute Care Choice: NA          DME Arranged: N/A DME Agency: NA       HH Arranged: NA HH Agency: NA        Social Drivers of Health (SDOH) Interventions SDOH Screenings   Food Insecurity: Patient Declined (03/01/2024)  Housing: Unknown (03/01/2024)  Transportation Needs: Patient Declined (03/01/2024)  Utilities: Patient Declined (03/01/2024)  Alcohol Screen: Low Risk (07/03/2021)  Depression (PHQ2-9): Low Risk (10/23/2021)  Financial Resource Strain: Low Risk  (12/02/2023)   Received from [facility]  Physical Activity: Inactive (12/02/2023)   Received from [facility]  Social Connections: Unknown (03/01/2024)  Stress: No Stress Concern Present (11/05/2023)   Received from [facility]  Tobacco Use: Medium Risk (02/29/2024)  Health Literacy: Adequate Health Literacy (11/05/2023)   Received from [facility]     Readmission Risk Interventions    03/02/2024   11:36 AM  Readmission Risk Prevention Plan  Transportation Screening Complete  HRI or Home Care Consult Complete  Social Work Consult for Recovery Care Planning/Counseling Complete  Palliative Care Screening Not Applicable  Medication Review Oceanographer) Complete

## 2024-03-03 ENCOUNTER — Telehealth: Payer: Self-pay

## 2024-03-03 NOTE — Telephone Encounter (Signed)
 Patient contacted regarding discharge from [facility] on 03/02/24  Patient understands to follow up with provider Izetta Hummer, PA-C on 03/09/24 at 1:05PM at [address] Location. Patient understands discharge instructions? Yes Patient understands medications and regiment? Yes Patient understands to bring all medications to this visit? Yes

## 2024-03-04 LAB — BODY FLUID CULTURE W GRAM STAIN: Culture: NO GROWTH

## 2024-03-06 ENCOUNTER — Ambulatory Visit: Admitting: Medical

## 2024-03-06 ENCOUNTER — Ambulatory Visit: Admitting: Cardiovascular Disease

## 2024-03-07 ENCOUNTER — Encounter: Payer: Self-pay | Admitting: Family

## 2024-03-07 LAB — CYTOLOGY - NON PAP

## 2024-03-07 NOTE — Progress Notes (Unsigned)
 " [facility]   MULTIDISCIPLINARY HEART VALVE CLINIC                                     Cardiology Office Note:    Date:  03/09/2024   ID:  Jacob Montes, DOB [birth year], MRN [id number]  PCP:  [name], MD  [facility] Cardiologist:  Evalene Lunger, MD  [facility] Structural heart: Lonni Cash, MD [facility] Electrophysiologist:  None   Referring MD: [name], MD   1 month s/p TAVR  History of Present Illness:    Jacob Montes is a 84 y.o. 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   He has had multiple recent admissions with heart failure felt to be due to his bioprosthetic AVR failure. He was most recently admitted to [facility] with CHF on 02/01/24 and was transferred to [facility] for management. Echo 02/01/24 with LVEF=50-55%. His bioprosthetic AVR had severe stenosis and moderate perivalvular leak. Valve felt to be underexpanded. He was volume overloaded and was diuresed with IV Lasix . Cardiac cath with patent grafts. He developed an aspiration pneumonia and had hospital delirium. We considered inpatient TAVR but this was cancelled due to his delirium. His mental status improved prior to discharge. He was discharged to a SNF. S/p VIV TAVR in TAVR with a 26 mm Medtronic FX THV via the TF approach on 02/29/24. Post operative echo showed EF 50%, mild cLVH, severe MAC with mild MR,  normally functioning TAVR with a mean gradient of 6 mmHg and no PVL. He was very difficult to rouse post operatively and code stroke called. MRI showed small punctate infarcts in the left centrum semiovale and otherwise chronic disease. LVEDP 26 mm hg at the time of TAVR and diuresed  with IV Lasix . He developed a new LBBB after TAVR and discharged with a Zio AT. Discharged on Eliquis  5mg  BID x 3 months.   Today the patient presents to clinic for follow up. Here with his wife. No CP or SOB. No LE edema, orthopnea or PND. No dizziness or syncope. No blood in stool or urine. No palpitations. He is doing so much better than prior to valve surgery. Weight was up last week and he took an extra lasix  with good result. Legs are the skinniest they have been in weeks.    Past Medical History:  Diagnosis Date   Acute exacerbation of CHF (congestive heart failure) (HCC) 01/19/2024   Aortic stenosis s/p TAVR    a. 01/2017 s/p TAVR - 23mm Sapien; b. 11/2023 Echo: EF 50-55%, GrII DD, nl RV fxn, mild-mod MR, mild Ca2+ MS, Mod AS, AVA 0.84, mean gradient 32 mmHg, mild perivalvular leak; c. 01/2024 Echo: EF 50-55%, no rwma, GrII DD, nl RV fxn, mod dil LA, mildly dil RA, mild MR/MS, sev AS w/ mod perivalvular leak - mean grad , Vmax 4.52 m/s, DVI 0.13.   CAD (coronary artery disease)    a. 07/2007 s/p CABG x 3 [facility]); b. 12/2023 MV: No isch/infarct; c. 01/2024 Cath: LM 95ost/m, LAD 174m, LCX 100ost/p/m, OM3 40, RCA small, 95p,  57m, RPDA fills via collats from LAD. VG->OM1 nl, VG->OM3 nl, LIMA->LAD nl.   Cancer (HCC)    Skin   Carotid artery disease    a. 2009 s/p left sided CEA complicated by RLN parlaysis; b. 02/2023 U/S: no significant dzs.   Cataract    Chronic heart failure with preserved ejection fraction (HFpEF) (HCC)    a. 01/2024 Echo: EF 50-55%, GrII DD, nl RV fxn, mod dil LA, mild MR/MS, severe AS, mod perivalvular leak; b. 01/2024 RHC: RA 3, RV 66/2, PA 55/17 (30), PCWP 20.   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 Verlin and Dr. Lucas   Skin cancer    Sleep apnea    Stroke [facility])      Current Medications: Active Medications[1]    ROS:   Please see the history of present illness.    All other systems reviewed and are negative.  EKGs   EKG Interpretation Date/Time:  Thursday March 09 2024 12:56:12 EST Ventricular Rate:  71 PR Interval:  218 QRS Duration:  162 QT Interval:  442 QTC Calculation: 480 R Axis:   -53  Text Interpretation: Sinus rhythm with 1st degree A-V block Left axis deviation Left bundle branch block When compared with ECG of 02-Mar-2024 02:00, PR interval has increased QRS axis Shifted left Confirmed by Sebastian Pagan [phone]) on 03/09/2024 1:10:31 PM   Risk Assessment/Calculations:           Physical Exam:    VS:  BP (!) 110/38   Pulse 71   Ht 5' 6 (1.676 m)   Wt 194 lb 3.2 oz (88.1 kg)   SpO2 97%   BMI 31.34 kg/m     Wt Readings from Last 3 Encounters:  03/09/24 194 lb 3.2 oz (88.1 kg)  03/02/24 195 lb 5.2 oz (88.6 kg)  02/24/24 210 lb 9.6 oz (95.5 kg)     GEN: Well nourished, well developed in no acute distress NECK: No JVD CARDIAC: RRR, 2/6 SEM @ RUSB. No rubs, gallops RESPIRATORY:  Clear to auscultation without rales, wheezing or rhonchi  ABDOMEN: Soft, non-tender, non-distended EXTREMITIES:  No edema; No deformity.  Groin sites clear without hematoma. Ecchymosis on left groin.   ASSESSMENT:    1. S/P VIV TAVR in TAVR (transcatheter aortic valve replacement)   2. Left bundle branch block   3. Chronic diastolic CHF (congestive heart failure) (HCC)   4. Coronary artery disease involving native coronary artery of native heart without angina pectoris   5. Bilateral carotid artery disease, unspecified type   6. Dementia with agitation, unspecified dementia severity,  unspecified dementia type (HCC)     PLAN:    In order of problems listed above:  Severe bioprosthetic AS s/p TAVR in TAVR:   -- Pt doing great s/p TAVR.  -- Groin sites healing well.  -- SBE prophylaxis discussed; I have RX'd amoxicillin .  -- Continue Eliquis  5mg  BID x 3 months then transition to aspirin  81mg  daily.  -- Cleared to resume all activities without restriction. --  I will see back for 1 month echo and OV.  New LBBB: -- Wearing a Zio AT, but this has been falling off. I removed this today and will mail back to the company.  -- ECG today with 1st deg AV block and stable LBBB.    Chronic diastolic HF -- Appears euvolemic.  -- Continue Lasix  40mg  daily and KCL 20 meq daily.  -- BMET today.    Bilateral pleural effusion: -- S/p R thoracentesis with 850 mL of cloudy amber fluid.    CAD s/p CABG '09 -- No chest pain -- [facility] 02/04/24 with significant underlying three-vessel coronary artery disease with patent grafts including LIMA to LAD, SVG to OM1 and SVG to OM 3. There was significant disease in the right coronary artery but the vessel is small and significant ostial left main stenosis supplying only a first diagonal branch. No targets for intervention.  -- Continue medical therapy with metoprolol  succinate 25 mg daily and Crestor  40 mg daily. No aspirin  while on Eliquis  x 3 months.    Carotid stenosis s/p LCEA in 2009 c/b R laryngeal nerve paralysis: -- Continue medical therapy.   Dementia with hospital delirium:  -- Pt has not had any further issues with worsening cognitive issues.    Medication Adjustments/Labs and Tests Ordered: Current medicines are reviewed at length with the patient today.  Concerns regarding medicines are outlined above.  Orders Placed This Encounter  Procedures   EKG 12-Lead   Meds ordered this encounter  Medications   amoxicillin  (AMOXIL ) 500 MG tablet    Sig: Take 4 tablets (2,000 mg total) by mouth as directed. 1 hour prior to dental  work including cleanings    Dispense:  12 tablet    Refill:  12    Supervising Provider:   COOPER, MICHAEL [[id number]]   apixaban  (ELIQUIS ) 5 MG TABS tablet    Sig: Take 1 tablet (5 mg total) by mouth 2 (two) times daily. Please use 30 day free card.    Dispense:  60 tablet    Refill:  1    Supervising Provider:   WONDA SHARPER [[id number]]    Patient Instructions  Medication Instructions:   Your provider recommends that you continue on your current medications as directed. Please refer to the Current Medication list given to you today.   An antibiotic has been called into your pharmacy to take as needed for dental work. Please follow instructions on the bottle.  *If you need a refill on your cardiac medications before your next appointment, please call your pharmacy*  Lab Work: BMET today  If you have labs (blood work) drawn today and your tests are completely normal, you will receive your results only by: MyChart Message (if you have MyChart) OR A paper copy in the mail If you have any lab test that is abnormal or we need to change your treatment, we will call you to review the results.  Testing/Procedures: none  Follow-Up: At [facility], you and your health needs are our priority.  As part of our continuing mission to provide you with exceptional heart care, our providers are all part of one team.  This team includes your primary Cardiologist (physician) and Advanced Practice Providers or APPs (Physician Assistants and Nurse Practitioners) who all work together to provide you with the care you need, when you need it.  Your next appointment:    Please keep your follow up as scheduled.   Please continue regular follow up with your  primary cardiologist.   Signed, Lamarr Hummer, PA-C  03/09/2024 1:33 PM    [facility]     [1]  Current Meds  Medication Sig   acetaminophen  (TYLENOL ) 325 MG tablet Take 2 tablets (650 mg total) by mouth every 6  (six) hours as needed for mild pain (pain score 1-3) or fever (or Fever >/= 101).   amoxicillin  (AMOXIL ) 500 MG tablet Take 4 tablets (2,000 mg total) by mouth as directed. 1 hour prior to dental work including cleanings   docusate sodium  (COLACE) 100 MG capsule Take 100 mg by mouth daily as needed for mild constipation.   furosemide  (LASIX ) 40 MG tablet Take 1 tablet (40 mg total) by mouth daily.   metoprolol  succinate (TOPROL -XL) 25 MG 24 hr tablet Take 1 tablet (25 mg total) by mouth daily.   montelukast  (SINGULAIR ) 10 MG tablet Take 1 tablet (10 mg total) by mouth at bedtime as needed (allergies).   Multiple Vitamin (MULTIVITAMIN WITH MINERALS) TABS tablet Take 1 tablet by mouth daily.   omeprazole  (PRILOSEC  OTC) 20 MG tablet Take 20 mg by mouth daily.   polyethylene glycol (MIRALAX  / GLYCOLAX ) 17 g packet Take 17 g by mouth daily as needed for mild constipation.   potassium chloride  SA (KLOR-CON  M) 20 MEQ tablet Take 1 tablet (20 mEq total) by mouth daily.   Probiotic Product (PROBIOTIC PO) Take 1 tablet by mouth daily.   rosuvastatin  (CRESTOR ) 20 MG tablet Take 20 mg by mouth daily.   [DISCONTINUED] apixaban  (ELIQUIS ) 5 MG TABS tablet Take 1 tablet (5 mg total) by mouth 2 (two) times daily. Please use 30 day free card.   "

## 2024-03-09 ENCOUNTER — Ambulatory Visit: Attending: Physician Assistant | Admitting: Physician Assistant

## 2024-03-09 VITALS — BP 110/38 | HR 71 | Ht 66.0 in | Wt 194.2 lb

## 2024-03-09 DIAGNOSIS — F03911 Unspecified dementia, unspecified severity, with agitation: Secondary | ICD-10-CM

## 2024-03-09 DIAGNOSIS — I2510 Atherosclerotic heart disease of native coronary artery without angina pectoris: Secondary | ICD-10-CM

## 2024-03-09 DIAGNOSIS — Z952 Presence of prosthetic heart valve: Secondary | ICD-10-CM

## 2024-03-09 DIAGNOSIS — I779 Disorder of arteries and arterioles, unspecified: Secondary | ICD-10-CM

## 2024-03-09 DIAGNOSIS — I5032 Chronic diastolic (congestive) heart failure: Secondary | ICD-10-CM

## 2024-03-09 DIAGNOSIS — I447 Left bundle-branch block, unspecified: Secondary | ICD-10-CM

## 2024-03-09 MED ORDER — APIXABAN 5 MG PO TABS: 5.0000 mg | ORAL_TABLET | Freq: Two times a day (BID) | ORAL | 1 refills | Status: AC

## 2024-03-09 MED ORDER — AMOXICILLIN 500 MG PO TABS: 2000.0000 mg | ORAL_TABLET | ORAL | 12 refills | Status: AC

## 2024-03-09 NOTE — Patient Instructions (Signed)
 Medication Instructions:   Your provider recommends that you continue on your current medications as directed. Please refer to the Current Medication list given to you today.   An antibiotic has been called into your pharmacy to take as needed for dental work. Please follow instructions on the bottle.  *If you need a refill on your cardiac medications before your next appointment, please call your pharmacy*  Lab Work: BMET today  If you have labs (blood work) drawn today and your tests are completely normal, you will receive your results only by: MyChart Message (if you have MyChart) OR A paper copy in the mail If you have any lab test that is abnormal or we need to change your treatment, we will call you to review the results.  Testing/Procedures: none  Follow-Up: At [facility], you and your health needs are our priority.  As part of our continuing mission to provide you with exceptional heart care, our providers are all part of one team.  This team includes your primary Cardiologist (physician) and Advanced Practice Providers or APPs (Physician Assistants and Nurse Practitioners) who all work together to provide you with the care you need, when you need it.  Your next appointment:    Please keep your follow up as scheduled.   Please continue regular follow up with your primary cardiologist.

## 2024-03-09 NOTE — Addendum Note (Signed)
 Addended by: IZETTA CONCETTA KIDD on: 03/09/2024 01:39 PM   Modules accepted: Orders

## 2024-03-10 ENCOUNTER — Ambulatory Visit: Payer: Self-pay | Admitting: Physician Assistant

## 2024-03-10 LAB — BASIC METABOLIC PANEL WITH GFR
BUN/Creatinine Ratio: 13 (ref 10–24)
BUN: 13 mg/dL (ref 8–27)
CO2: 26 mmol/L (ref 20–29)
Calcium: 9.5 mg/dL (ref 8.6–10.2)
Chloride: 104 mmol/L (ref 96–106)
Creatinine, Ser: 0.98 mg/dL (ref 0.76–1.27)
Glucose: 141 mg/dL — ABNORMAL HIGH (ref 70–99)
Potassium: 4.5 mmol/L (ref 3.5–5.2)
Sodium: 142 mmol/L (ref 134–144)
eGFR: 77 mL/min/{1.73_m2}

## 2024-03-13 ENCOUNTER — Ambulatory Visit: Admitting: Physician Assistant

## 2024-03-17 ENCOUNTER — Ambulatory Visit: Admitting: Cardiovascular Disease

## 2024-04-03 ENCOUNTER — Ambulatory Visit: Admitting: Physician Assistant

## 2024-04-03 ENCOUNTER — Ambulatory Visit (HOSPITAL_COMMUNITY)

## 2024-04-24 ENCOUNTER — Ambulatory Visit: Admitting: Dermatology
